# Patient Record
Sex: Female | Born: 1935
Health system: Southern US, Community
[De-identification: ages and names within clinical notes are randomized; demographics above are authoritative.]

## PROBLEM LIST (undated history)

## (undated) DIAGNOSIS — E785 Hyperlipidemia, unspecified: Secondary | ICD-10-CM

## (undated) DIAGNOSIS — M1990 Unspecified osteoarthritis, unspecified site: Secondary | ICD-10-CM

## (undated) DIAGNOSIS — K219 Gastro-esophageal reflux disease without esophagitis: Secondary | ICD-10-CM

## (undated) DIAGNOSIS — F329 Major depressive disorder, single episode, unspecified: Secondary | ICD-10-CM

## (undated) DIAGNOSIS — I472 Ventricular tachycardia: Secondary | ICD-10-CM

## (undated) DIAGNOSIS — I10 Essential (primary) hypertension: Secondary | ICD-10-CM

## (undated) DIAGNOSIS — K449 Diaphragmatic hernia without obstruction or gangrene: Secondary | ICD-10-CM

## (undated) DIAGNOSIS — N189 Chronic kidney disease, unspecified: Secondary | ICD-10-CM

## (undated) DIAGNOSIS — F0390 Unspecified dementia without behavioral disturbance: Secondary | ICD-10-CM

## (undated) DIAGNOSIS — F32A Depression, unspecified: Secondary | ICD-10-CM

## (undated) DIAGNOSIS — I739 Peripheral vascular disease, unspecified: Secondary | ICD-10-CM

## (undated) HISTORY — DX: Major depressive disorder, single episode, unspecified: F32.9

## (undated) HISTORY — DX: Chronic kidney disease, unspecified: N18.9

## (undated) HISTORY — PX: CHOLECYSTECTOMY: SHX55

## (undated) HISTORY — DX: Diaphragmatic hernia without obstruction or gangrene: K44.9

## (undated) HISTORY — PX: CARDIAC CATHETERIZATION: SHX172

## (undated) HISTORY — DX: Depression, unspecified: F32.A

## (undated) HISTORY — DX: Gastro-esophageal reflux disease without esophagitis: K21.9

## (undated) HISTORY — PX: LOWER EXTREMITY ANGIOGRAM: SHX5955

---

## 1971-11-16 HISTORY — PX: ABDOMINAL HYSTERECTOMY: SHX81

## 1998-04-08 ENCOUNTER — Ambulatory Visit (HOSPITAL_COMMUNITY): Admission: RE | Admit: 1998-04-08 | Discharge: 1998-04-08 | Payer: Self-pay | Admitting: Ophthalmology

## 1998-04-25 ENCOUNTER — Ambulatory Visit (HOSPITAL_COMMUNITY): Admission: RE | Admit: 1998-04-25 | Discharge: 1998-04-25 | Payer: Self-pay | Admitting: Ophthalmology

## 1999-11-25 ENCOUNTER — Other Ambulatory Visit: Admission: RE | Admit: 1999-11-25 | Discharge: 1999-11-25 | Payer: Self-pay | Admitting: Family Medicine

## 2000-12-20 ENCOUNTER — Encounter (INDEPENDENT_AMBULATORY_CARE_PROVIDER_SITE_OTHER): Payer: Self-pay | Admitting: *Deleted

## 2000-12-20 ENCOUNTER — Ambulatory Visit (HOSPITAL_COMMUNITY): Admission: RE | Admit: 2000-12-20 | Discharge: 2000-12-20 | Payer: Self-pay | Admitting: Gastroenterology

## 2002-08-03 ENCOUNTER — Emergency Department (HOSPITAL_COMMUNITY): Admission: EM | Admit: 2002-08-03 | Discharge: 2002-08-03 | Payer: Self-pay | Admitting: Emergency Medicine

## 2002-08-03 ENCOUNTER — Encounter: Payer: Self-pay | Admitting: Emergency Medicine

## 2003-09-13 ENCOUNTER — Emergency Department (HOSPITAL_COMMUNITY): Admission: AD | Admit: 2003-09-13 | Discharge: 2003-09-13 | Payer: Self-pay | Admitting: Family Medicine

## 2004-01-06 ENCOUNTER — Ambulatory Visit (HOSPITAL_COMMUNITY): Admission: RE | Admit: 2004-01-06 | Discharge: 2004-01-06 | Payer: Self-pay | Admitting: Gastroenterology

## 2004-01-06 ENCOUNTER — Encounter (INDEPENDENT_AMBULATORY_CARE_PROVIDER_SITE_OTHER): Payer: Self-pay | Admitting: Specialist

## 2007-10-27 ENCOUNTER — Encounter: Admission: RE | Admit: 2007-10-27 | Discharge: 2007-10-27 | Payer: Self-pay | Admitting: Cardiovascular Disease

## 2007-11-02 ENCOUNTER — Inpatient Hospital Stay (HOSPITAL_COMMUNITY): Admission: RE | Admit: 2007-11-02 | Discharge: 2007-11-03 | Payer: Self-pay | Admitting: Cardiovascular Disease

## 2007-11-02 HISTORY — PX: LOWER EXTREMITY ANGIOGRAM: SHX5955

## 2007-11-14 ENCOUNTER — Ambulatory Visit: Payer: Self-pay | Admitting: Family Medicine

## 2009-02-18 ENCOUNTER — Encounter: Admission: RE | Admit: 2009-02-18 | Discharge: 2009-02-18 | Payer: Self-pay | Admitting: *Deleted

## 2009-02-28 LAB — HM COLONOSCOPY

## 2009-05-05 ENCOUNTER — Ambulatory Visit: Payer: Self-pay | Admitting: Family Medicine

## 2009-05-06 ENCOUNTER — Encounter: Admission: RE | Admit: 2009-05-06 | Discharge: 2009-05-06 | Payer: Self-pay | Admitting: Family Medicine

## 2009-05-30 ENCOUNTER — Ambulatory Visit (HOSPITAL_BASED_OUTPATIENT_CLINIC_OR_DEPARTMENT_OTHER): Admission: RE | Admit: 2009-05-30 | Discharge: 2009-05-30 | Payer: Self-pay | Admitting: Urology

## 2009-09-23 ENCOUNTER — Ambulatory Visit: Payer: Self-pay | Admitting: Family Medicine

## 2010-09-22 ENCOUNTER — Encounter: Admission: RE | Admit: 2010-09-22 | Discharge: 2010-09-22 | Payer: Self-pay | Admitting: Family Medicine

## 2010-09-22 LAB — HM DEXA SCAN

## 2010-09-28 ENCOUNTER — Ambulatory Visit: Payer: Self-pay | Admitting: Family Medicine

## 2010-12-07 ENCOUNTER — Encounter: Payer: Self-pay | Admitting: Family Medicine

## 2011-02-21 LAB — POCT I-STAT 4, (NA,K, GLUC, HGB,HCT)
Glucose, Bld: 107 mg/dL — ABNORMAL HIGH (ref 70–99)
HCT: 49 % — ABNORMAL HIGH (ref 36.0–46.0)
Hemoglobin: 16.7 g/dL — ABNORMAL HIGH (ref 12.0–15.0)
Potassium: 3.8 mEq/L (ref 3.5–5.1)
Sodium: 142 mEq/L (ref 135–145)

## 2011-03-30 NOTE — Op Note (Signed)
NAME:  Julie Bass, MARINER NO.:  [id number]   MEDICAL RECORD NO.:  [id number]          PATIENT TYPE:  AMB   LOCATION:  NESC                         FACILITY:  [facility]   PHYSICIAN:  Mark C. Vernie Ammons, M.D.  DATE OF BIRTH:  [birth year]   DATE OF PROCEDURE:  05/30/2009  DATE OF DISCHARGE:                               OPERATIVE REPORT   PREOPERATIVE DIAGNOSIS:  Right hydronephrosis with a history of right  flank pain and possible ureteral obstruction.   POSTOPERATIVE DIAGNOSIS:  1. History of right flank pain.  2. History of right hydronephrosis.  3. Normal right ureter without obstruction.   PROCEDURE:  1. Urethral dilatation.  2. Cystoscopy.  3. Ureteral catheterization.  4. Right retrograde pyelogram with interpretation.   SURGEON:  Mark C. Vernie Ammons, M.D.   ANESTHESIA:  General.   BLOOD LOSS:  0.   DRAINS:  None.   SPECIMENS:  None.   COMPLICATIONS:  None.   INDICATIONS:  The patient is an 77 year old female who was found to have  a right hydronephrosis on a CT scan done May 06, 2009.  When this CT  scan was compared to a previous CT angiogram, there appeared to be  worsening of right hydronephrosis and a large stone was seen in a  caliceal diverticulum on the left-hand side but no right renal calculi  or ureteral stones were identified along the course of the ureter.  The  dilatation of the right ureter appeared to be moderate and was noted to  be present down to the area where the ureter crosses over the iliac  vessels but there was no external mass in this region or evidence of  inflammation to suggest the presence of retroperitoneal fibrosis.  The  patient has subsequently had resolution of her right flank pain.  She  has not seen any stones pass.  We discussed the need to evaluate the  cause of her apparent partially obstructed right ureter and she  understands the procedure as well as its risks and complications and has  elected to proceed.  She was  on Plavix and that has been stopped for 7  days.   DESCRIPTION OF OPERATION:  After informed consent, the patient was  brought to the major OR and placed on the table and administered general  anesthesia, then moved to the dorsal lithotomy position.  Her genitalia  was sterilely prepped and draped and initially I attempted to pass a 22-  French cystoscope sheath with obturator but noted some resistance at the  area of the urethra, so I then performed urethral dilation.   Urethral dilatation was performed using female sounds and I dilated from  28 [place] to 24 [place] without difficulty or evidence of bleeding.   I was then able to pass the 22-French cystoscope with 12-degree lens  into the urethra easily and noted no evidence of stricture or lesion.  The bladder was then entered and inspected with both the 12 and 70-  degree lenses and noted to be free of any tumor, stones or inflammatory  lesions.  The left orifice was easily identified but  the right orifice  could not be easily seen.  She had a mild cystocele, so I placed Ray-Tec  sponges in the vagina to elevate the bladder base and was then, with the  use of a 70-degree lens and the Albarran bridge, able to identify the  orifice.  The orifice was cannulated was first accessed by passing a  0.038-inch floppy tip guidewire through the open-ended catheter and into  the right orifice.  I then passed the 6-French open-ended catheter over  the guidewire into the distal ureter and then removed the guidewire.  Unfortunately, this allowed there to be the presence of air within the  ureteral catheter so that when I performed the retrograde pyelogram, air  bubbles were introduced and were visualized on the retrograde study.  However, they were observed throughout the procedure and noted to be air  bubbles and not filling defects.  I performed the right retrograde  pyelogram by injecting full-strength contrast under direct fluoroscopy.  As I  watched the contrast pass up the ureter, I noted it passed  unobstructed with no evidence of narrowing of the ureter.  There was an  area where the ureter crossed over the iliac vessels that produced some  slight dilatation proximal to that but the ureter was not  hydronephrotic.  There was no evidence of ureteropelvic junction  obstruction and the collecting system was noted to be sharp, free of  filling defects or mass effect.  I then filled the collecting system and  observed the contrast passing down the ureter with the ureteral catheter  removed and the bladder emptied, and the contrast passed unimpeded all  the way down the ureter without evidence of obstruction, stricture or  other abnormality.  My impression was that this was an entirely normal  right retrograde pyelogram.   Her bladder was drained and the cystoscope was removed.  The vaginal  packing was removed as well and the patient was awakened and taken to  the recovery room in stable and satisfactory condition.  She tolerated  the procedure well with no intraoperative complications.   At this point it appears that the hydronephrosis was merely due to  some slight dilatation of the ureter proximal to the iliac vessels at  where the ureter crosses the vessels, but no evidence of abnormal  obstruction was identified today.  I therefore told the patient that she  could restart her Plavix.  She will be discharged home later today and  will follow up with me on a p.r.n. basis.      Mark C. Vernie Ammons, M.D.  Electronically Signed     MCO/MEDQ  D:  05/30/2009  T:  05/30/2009  Job:  [id number]

## 2011-03-30 NOTE — Cardiovascular Report (Signed)
NAME:  Julie Bass, Julie Bass NO.:  [id number]   MEDICAL RECORD NO.:  [id number]          PATIENT TYPE:  OIB   LOCATION:  2807                         FACILITY:  [facility]   PHYSICIAN:  Nanetta Batty, M.D.   DATE OF BIRTH:  09-Feb-1926   DATE OF PROCEDURE:  DATE OF DISCHARGE:                            CARDIAC CATHETERIZATION   HISTORY OF PRESENT ILLNESS:  [name] is a 77 year old [place]  American female whose daughter, Burna Mortimer, is a Engineer, civil (consulting) of ours.  She has a  history hypertension, hyperlipidemia.  She complains of excessive  bilateral calf claudication.  Dopplers revealed bilateral SFA disease.  She presents now for angiography and potential intervention.   DESCRIPTION OF PROCEDURE:  The patient was brought to the second floor  [facility] Pediatric catheter suite in the postabsorptive state.  She  was premedicated with p.o. Valium.  Groin was prepped and shaved in  usual sterile fashion and 1% Xylocaine was used for local anesthesia.  A  6-French sheath was inserted into the right femoral artery using  standard Seldinger technique.  A 6-French tennis racket catheter and  crossover catheter and end-hole were used for abdominal aortography with  bifemoral runoff.  Visipaque dye was used through the entirety of the  case.  Retroaortic, left retrograde pressures monitored the case.   ANGIOGRAPHIC RESULTS:  1. Abdominal aorta.      a.     Renal arteries - normal.      b.     Infra-renal abdominal aorta - normal.  2. Left lower extremity.      a.     A 80% fairly focal proximal left SFA stenosis.      b.     A 60-70% segmental mid left SFA stenosis with one-vessel       runoff via the peroneal.  3. Right lower extremity.      a.     A 60% proximal right SFA stenosis.      b.     A 70% segmental mid right SFA stenosis with one-vessel       runoff via the anterior tibial.   DESCRIPTION OF PROCEDURE:  Contralateral access was obtained with a 5-  [place] crossover  catheter, a 0.035 Wholey wire and 7-French crossover  sheath.  The patient received 3000 years of heparin intravenously.  PTA  was performed on the mid left SFA using a 4 x 8 Powerflex and proximally  using a 4 x 2 Powerflex.  Stenting was performed in the mid left SFA  using a 6 x 120 mm EV3 self-expanding stent in the proximal left SFA  with a 6 x 3 Ev3 stent.  Post dilatation was performed with a 610 mid  and 63 proximal.  Final angiographic result was reduction of 80%  proximal left SFA stenosis to 0% residual and 70% long segmental mid-  left SFA to 0%.  The patient tolerated procedure well.  An ACT was  measured and the sheath was removed.  Pressure applied to the groin and  achieved good hemostasis.  The patient left lab in stable condition.  She will be hydrated overnight, discharged home in the morning.  We will  get follow up Dopplers and ABIs as outpatient after which she will see  me back in the office.  She may benefit from staged right SFA  intervention.      Nanetta Batty, M.D.  Electronically Signed     JB/MEDQ  D:  11/02/2007  T:  11/03/2007  Job:  [id number]   cc:   Pediatric Catheterization Suite  [facility]  Talmadge Coventry, M.D.

## 2011-04-02 NOTE — Op Note (Signed)
NAME:  Julie Bass, Julie Bass NO.:  [id number]   MEDICAL RECORD NO.:  [id number]                   PATIENT TYPE:  AMB   LOCATION:  ENDO                                 FACILITY:  [facility]   PHYSICIAN:  Petra Kuba, M.D.                 DATE OF BIRTH:  10-16-1926   DATE OF PROCEDURE:  01/06/2004  DATE OF DISCHARGE:                                 OPERATIVE REPORT   PROCEDURE PERFORMED:  Colonoscopy with polypectomy.   ENDOSCOPIST:  Petra Kuba, M.D.   INDICATIONS FOR PROCEDURE:  Patient with history of colon polyps due for  repeat screening.  Consent was signed after the risks, benefits, methods and  options were thoroughly discussed in the office on multiple occasions.   MEDICINES USED:  Demerol 60 mg, Versed 6 mg.   DESCRIPTION OF PROCEDURE:  Rectal inspection was pertinent for external  hemorrhoids, small.  Digital exam was negative.  A video pediatric  adjustable colonoscope was inserted with a moderate amount of difficulty due  to a long looping tortuous colon.  With multiple abdominal pressures and  rolling her on her back, we were able to advance to the cecum.  On  insertion, a rare occasional scattered diverticula were seen.  Also at the  approximate level of the mid-descending, a tiny to small polyp was seen and  was cold biopsied times two on insertion.  No other abnormalities were seen  on insertion.  The cecum was identified by the appendiceal orifice and the  ileocecal valve.  The prep was adequate.  Scope was slowly withdrawn.  There  was a moderate amount of liquid stool that required washing and suctioning,  but on slow withdrawal through the colon there was a tiny ascending and a  tiny transverse polyp that were each hot biopsied and put in the second  container.  The scope was slowly withdrawn. Again, the rare diverticula were  confirmed.  We withdrew back to the polypectomy site from insertion which  had a little bit of old heme but no  obvious residual polypoid tissue.  The  scope was further withdrawn.  One tiny sigmoid and two tiny rectal polyps  were seen and were not biopsied.  All looked hyperplastic.  They were put in  the first container with the descending polyp.  No other abnormalities were  seen.  We could see an occasional white scar from the previous polypectomy  site.  Anorectal pull through and retroflexion confirmed some small  hemorrhoids.  Scope was straightened and readvanced a short ways up the left  side of the colon, air was suctioned, scope removed.  The patient tolerated  the procedure well.  There was no immediate obvious complication.   ENDOSCOPIC DIAGNOSIS:  1. Internal and external hemorrhoids.  2. Tortuous long looping colon.  3. Rare left and right diverticula.  4. Rectal and sigmoid tiny polyps all hot biopsied.  5. Descending tiny polyp on insertion, cold biopsied.  6. Two ascending and transverse polyps, tiny, hot biopsied.  7. Otherwise within normal limits to the cecum.   PLAN:  Await pathology.  Consider repeat screening one more time.  Happy to  see back p.r.n.  Otherwise in six months, recheck symptoms and make sure no  further work-up plans are needed.                                               Petra Kuba, M.D.    MEM/MEDQ  D:  01/06/2004  T:  01/06/2004  Job:  [id number]   cc:   Lilla Shook, M.D.  [address], [address]  [place]  [place] [address]  Fax: [phone]

## 2011-04-02 NOTE — Procedures (Signed)
[place]  Patient:    Julie Bass, Julie Bass                     MRN: [id number] Proc. Date: 12/20/00 Adm. Date:  [id number] Attending:  Nelda Marseille CC:         Ronnald Nian, M.D.   Procedure Report  PROCEDURE:  Esophagogastroduodenoscopy with biopsy.  INDICATIONS:  Abdominal pain, history of hiatal hernia,  nondiagnostic colonoscopy.  Consent was signed after risks, benefits, methods and options were thoroughly discussed in the office.  MEDICATIONS:  Additional medications for this procedure was 10 of Demerol and 2 of Versed.  PROCEDURE:  The video endoscope was inserted by direct vision.  The esophagus was pertinent for some minimal esophagitis distally, which was cold-biopsied at the end of the procedure.  In the distal esophagus was also a moderate hiatal hernia with a widely patent ring.  Scope passed easily into the stomach, where some minimal antritis/gastritis was seen.  Advanced through a normal pylorus into a normal duodenal bulb and around the C-loop to a normal second portion of the duodenum.  Scope was withdrawn back to the bulb and a good look there ruled out any abnormality in that location.  Scope was withdrawn back to the stomach and retroflexed.  High in the cardia, the hiatal hernia was confirmed.  The fundus, angularis, lesser and greater curves were all normal, except for the minimal gastritis.  Scope was straightened and straight visualization of the stomach was normal, except for the minimal antritis/gastritis mentioned above.  Air was suctioned and the scope slowly withdrawn back to 20 cm, which confirmed the above findings.  We went ahead and took distal esophageal biopsies at this juncture.  Scope was slowly withdrawn.  Patient tolerated the procedure well.  There was no obvious immediate complication and no additional esophageal findings.  Scope was removed.  ENDOSCOPIC DIAGNOSES: 1. Minimal esophagitis,  status post biopsy. 2. Moderate hiatal hernia with a widely patent ring. 3. Minimal antritis, gastritis. 4. Otherwise normal EGD.  PLAN:  Continue Nexium and await pathology to rule out any microscopic abnormality.  Call p.r.n., otherwise follow up in two months. DD:  12/20/00 TD:  12/20/00 Job: [id number]

## 2011-04-02 NOTE — Procedures (Signed)
[place]  Patient:    Julie Bass, Julie Bass                     MRN: [id number] Proc. Date: 12/20/00 Adm. Date:  [id number] Attending:  Nelda Marseille CC:         Ronnald Nian, M.D.   Procedure Report  PROCEDURE:  Colonoscopy.  INDICATIONS:  Patient with abdominal pain, questionable etiology due for colonic screening.  Consent was signed after risks, benefits, methods and options were thoroughly discussed in the office.  MEDICATIONS:  Demerol 50, Versed 5.  PROCEDURE:  Rectal inspection was pertinent for external hemorrhoids.  Digital examination was negative.  The video colonoscope was inserted and with some difficulty probably due to a tortuous colon and adhesions.  We were able to advance to the level of the ileocecal valve.  To get to this level, required rolling her on her back and some abdominal pressure.  To advance to the cecum, required rolling her on her right side and the scope fell into the cecum, which was identified by the appendiceal orifice and the ileocecal valve.  In fact, the scope was inserted a short ways in the terminal ileum, which was normal.  Photo documentation was obtained.  The scope was then slowly withdrawn.  The prep was adequate.  There was some liquid stool that required washing and suctioning.  On slow withdrawal through the colon, the cecum, ascending and transverse were normal. As the scope was withdrawn around the left side of the colon, a rare left-sided diverticuli was seen in the distal sigmoid and rectum.  Three small polyps were seen, which were all hot-biopsied  x 1.  A few hyperplastic-appearing polyps were seen and a few of these were hot-biopsied as well, but some were not.  The ones that were not biopsied were tiny and classic hyperplastic appearance.  The scope was retroflexed back in the rectum pertinent for some internal hemorrhoids.  Scope was straightened, air was withdrawn, scope was  removed.  The patient tolerated the procedure well.  There were no obvious immediate complication.  ENDOSCOPIC DIAGNOSES: 1. Internal and external hemorrhoids with question ______ prolapse not    mentioned above. 2. Tiny left occasional diverticuli. 3. Multiple tiny rectosigmoid distal polyps, few hot-biopsied, a few    hyperplastic-appearing polyps were not biopsied. 4. Otherwise within normal limits to the terminal ileum.  PLAN:  Await pathology to determine future colonic screening.  Follow up p.r.n. or in two months to recheck symptoms, but for now it sounds like the Nexium is helping so will continue it.  One week postpolypectomy instructions and will go ahead and continue work-up with an endoscopy just to make sure no upper tract significant lesions are missed. DD:  12/20/00 TD:  12/20/00 Job: [id number]

## 2011-04-02 NOTE — Discharge Summary (Signed)
NAME:  Julie Bass, Julie Bass NO.:  [id number]   MEDICAL RECORD NO.:  [id number]          PATIENT TYPE:  INP   LOCATION:  6525                         FACILITY:  [facility]   PHYSICIAN:  Nanetta Batty, M.D.   DATE OF BIRTH:  31-Oct-1926   DATE OF ADMISSION:  11/02/2007  DATE OF DISCHARGE:  11/03/2007                               DISCHARGE SUMMARY   DISCHARGE DIAGNOSES:  1. Limiting claudication.      a.     Abnormal peripheral vascular Dopplers.  2. Peripheral vascular disease.      a.     Percutaneous transluminal stents to the left superficial       femoral artery.      b.     Residual 60% stenosis of the right superficial femoral       artery.  3. Hypertension, controlled.  4. Hyperlipidemia.  5. History of mild to moderate tricuspid regurg.  6. Mild concentric LVH with normal systolic LVH.  7. Pre-procedure Myoview study was negative for ischemia, EF 84%.   PROCEDURES:  11/02/07:  PV angiogram by Dr. Nanetta Batty including PTA  and stent to the an 80% stenosis in the left SSA and mid-stenosis of  70%.   DISCHARGE MEDICATIONS:  1. Caduet 10/40 mg 1 daily.  2. Nexium 40 mg daily.  3. Diovan/HCT 150/12.5 daily.  4. Aspirin 325 mg daily.  5. Cosopt 1 drop both eyes daily.  6. Plavix 75 mg 1 daily.   DISCHARGE INSTRUCTIONS:  1. Low sodium, heart healthy diet.  2. Increase activity slowly.  3. May shower or bathe.  4. No lifting for 2 days.  5. No driving for 2 days.  6. __________  swelling or drainage.  7. Follow up with Dr. Allyson Sabal November 23, 2007 at 10:30 a.m.  8. Have Dopplers for left lower extremity at [facility] __________      2009 at 11:30 a.m.   HISTORY OF PRESENT ILLNESS:  A 77 year old African-American female who  is the mother of one of our nurses, Burna Mortimer in our office.  Was seen by  Dr. Allyson Sabal for history of hypertension, hyperlipidemia and now  claudication in her bilateral calves.  She had Dopplers in our office  that revealed ABIs in the   0.76 range with high frequency signals from  both SFAs.  Plan is for peripheral angiography.  The patient was brought  in to [facility] on 11/02/07, underwent PV angiogram, which did  reveal stenosis of bilateral SFAs, greater in the left with an 80%  stenosis and 70% mid.  Both of these were stented and plans for 60%  stent in the future on the right.  The patient was kept overnight in the  hospital and was stable the next morning, ambulating without problems  and was discharged home.   PHYSICAL EXAMINATION:  VITAL SIGNS:  At discharge blood pressure 129/46,  pulse 73, respiratory rate 18, temperature 98.6, oxygen saturation 98%.  HEART:  Regular rate and rhythm.  LUNGS:  Clear.  EXTREMITIES:  With 2+ left pedal pulse.  Right groin check was stable.   LABS:  Hemoglobin 13.4, hematocrit 39.4, WBC 9.6, platelets 199.  Sodium  143, potassium 3.5, BUN 10, creatinine 1, glucose 125, calcium 9.3.   The patient ambulated without problem and was discharged home with  followup as an outpatient.      Darcella Gasman. Annie Paras, N.P.      Nanetta Batty, M.D.  Electronically Signed    LRI/MEDQ  D:  11/29/2007  T:  11/29/2007  Job:  [id number]   cc:   Nanetta Batty, M.D.  Nicki Guadalajara, M.D.  Talmadge Coventry, M.D.

## 2011-08-20 LAB — BASIC METABOLIC PANEL
BUN: 10
CO2: 25
Calcium: 9.3
Chloride: 104
Creatinine, Ser: 1
GFR calc Af Amer: >60
GFR calc non Af Amer: 53 — ABNORMAL LOW
Glucose, Bld: 125 — ABNORMAL HIGH
Potassium: 3.5
Sodium: 143

## 2011-08-20 LAB — CBC
HCT: 39.4
Hemoglobin: 13.4
MCHC: 33.9
MCV: 91
Platelets: 199
RBC: 4.33
RDW: 13.7
WBC: 9.6

## 2011-09-29 ENCOUNTER — Encounter: Payer: Self-pay | Admitting: Family Medicine

## 2011-09-29 ENCOUNTER — Ambulatory Visit
Admission: RE | Admit: 2011-09-29 | Discharge: 2011-09-29 | Disposition: A | Discharge status: Home / Self Care | Payer: Medicare Other | Source: Ambulatory Visit | Attending: Medical | Admitting: Medical

## 2011-09-29 ENCOUNTER — Ambulatory Visit (INDEPENDENT_AMBULATORY_CARE_PROVIDER_SITE_OTHER): Payer: Medicare Other | Admitting: Medical

## 2011-09-29 ENCOUNTER — Encounter: Payer: Self-pay | Admitting: Medical

## 2011-09-29 VITALS — BP 138/70 | HR 61 | Temp 97.8°F | Resp 16 | Wt 128.0 lb

## 2011-09-29 DIAGNOSIS — Z23 Encounter for immunization: Secondary | ICD-10-CM

## 2011-09-29 DIAGNOSIS — R5381 Other malaise: Secondary | ICD-10-CM

## 2011-09-29 DIAGNOSIS — R05 Cough: Secondary | ICD-10-CM

## 2011-09-29 DIAGNOSIS — M549 Dorsalgia, unspecified: Secondary | ICD-10-CM

## 2011-09-29 DIAGNOSIS — M25559 Pain in unspecified hip: Secondary | ICD-10-CM

## 2011-09-29 DIAGNOSIS — R059 Cough, unspecified: Secondary | ICD-10-CM

## 2011-09-29 DIAGNOSIS — W19XXXA Unspecified fall, initial encounter: Secondary | ICD-10-CM

## 2011-09-29 DIAGNOSIS — R5383 Other fatigue: Secondary | ICD-10-CM

## 2011-09-29 DIAGNOSIS — R55 Syncope and collapse: Secondary | ICD-10-CM

## 2011-09-29 LAB — POCT URINALYSIS DIPSTICK
Bilirubin, UA: NEGATIVE
Blood, UA: NEGATIVE
Glucose, UA: NEGATIVE
Ketones, UA: NEGATIVE
Leukocytes, UA: NEGATIVE
Nitrite, UA: NEGATIVE
Protein, UA: NEGATIVE
Spec Grav, UA: 1.01
Urobilinogen, UA: 4
pH, UA: 7.5

## 2011-09-29 NOTE — Progress Notes (Signed)
Subjective:   HPI  Julie Bass is a 77 y.o. female who presents with her daughter.  Her daughter works at [employer] and [employer].  Julie Bass notes that she doesn't feel good, and has felt fatigue for a while.  However, Monday 3 days ago she was standing at the sink washing dishes and just fell out.  She denies any nausea, dizziness, chest pain or other, but fainted/blacked out.  When she came to she was lying on her right side.  She denies head injury, but does have some pain and discomfort in her buttocks.  Her daughter notified Dr. Tresa Endo, her cardiologist whom her daughter works with, and she has been set up for Holter Monitor given the syncope.  She just had labs and EKG last week which were "fine."  Daughter brought her labs with her today.  In addition to the syncope episode, she has been having cough for over a month, fatigue for weeks, has some sinus problems, runny nose, sneezing, and doesn't feel her best in general.  She has felt some dizziness this week.  Daughter wants her to have chest xray and urinalysis to rule out infection.    Daughter notes that she has been under more stress as her grandson lives with her.  He doesn't pick up after himself like Mr. Shain would like, and she stresses some about him.   She denies hx/o stroke, denies vision or hearing changes, no unilateral weakness, numbness, or tingling.  No other aggravating or relieving factors.   No other c/o.  The following portions of the patient's history were reviewed and updated as appropriate: allergies, current medications, past family history, past medical history, past social history, past surgical history and problem list.  No Known Allergies  Past Medical History  Diagnosis Date  . Hypertension   . Hiatal hernia   . Glaucoma   . Depression   . Osteoporosis   . Hyperlipidemia   . Chronic kidney disease     KIDNEY STONES   Review of Systems Constitutional: -fever, -chills, -sweats,  -unexpected -weight change,+fatigue ENT: +runny nose, -ear pain, -sore throat Cardiology:  -chest pain, -palpitations, -edema Respiratory: +cough, -shortness of breath, -wheezing Gastroenterology: -abdominal pain, -nausea, -vomiting, -diarrhea, -constipation Hematology: -bleeding or bruising problems Musculoskeletal: -arthralgias, -myalgias, -joint swelling, +back pain Ophthalmology: -vision changes Urology: -dysuria, -difficulty urinating, -hematuria, -urinary frequency, -urgency Neurology: -headache, +weakness, -tingling, -numbness   Objective:   Physical Exam  Filed Vitals:   09/29/11 1658  BP: 138/70  Pulse: 61  Temp:   Resp: 16    General appearance: alert, no distress, WD/WN, black female HEENT: normocephalic, sclerae anicteric, PERRLA, EOMi, nares patent, no discharge or erythema, pharynx normal Oral cavity: MMM, no lesions Neck: supple, no lymphadenopathy, no thyromegaly, no masses, no bruits Heart: RRR, normal S1, S2, no murmurs Lungs: CTA bilaterally, no wheezes, rhonchi, or rales Abdomen: +bs, soft, non tender, non distended, no masses, no hepatomegaly, no splenomegaly Back: tender along sacrum and low back, otherwise nontender Musculoskeletal: nontender, no swelling, no obvious deformity, hip normal ROM Extremities: no edema Pulses: 1+ symmetric Neurological: alert, oriented x 2, CN2-12 intact, strength normal upper extremities and lower extremities, sensation normal, DTRs 1+ throughout, no cerebellar signs, gait slow but no shuffling or falls Psychiatric: normal affect, behavior normal, pleasant    Assessment and Plan :    Encounter Diagnoses  Name Primary?  . Syncope Yes  . Fatigue   . Cough   . Fall   .  Pelvic joint pain   . Back pain   . Need for prophylactic vaccination and inoculation against influenza     We will send her for xrays and will check some labs.  I reviewed her recent labs as well.   Advised that the plan is to rule out back or pelvic  fractures or instability.   CXR to help eval the cough. Urinalysis today to rule out infection.  Repeat CBC and BMET for possible causes of the fatigue and ill feeling.  Advised that fatigue is multifactorial.  Certainly she needs to f/u with Dr. Tresa Endo regarding Holter Monitor and syncope.  For now, use fall precautions, daughter to help keep watch on her.  Discussed possibility of late effects of intracranial bleed or symptoms that would prompt CT head.  If her mental status changes, confusion, or any worsening of her symptom, we will consider head CT as well. She is afebrile today, and no specific signs of fracture or illness, no lung consolidations, no other significant exam findings.  Updated her flu vaccine.  Follow-up pending labs, xrays.

## 2011-09-30 ENCOUNTER — Other Ambulatory Visit: Payer: Self-pay | Admitting: Medical

## 2011-09-30 ENCOUNTER — Encounter: Payer: Self-pay | Admitting: Medical

## 2011-09-30 DIAGNOSIS — M549 Dorsalgia, unspecified: Secondary | ICD-10-CM | POA: Insufficient documentation

## 2011-09-30 DIAGNOSIS — R5383 Other fatigue: Secondary | ICD-10-CM | POA: Insufficient documentation

## 2011-09-30 DIAGNOSIS — R05 Cough: Secondary | ICD-10-CM | POA: Insufficient documentation

## 2011-09-30 DIAGNOSIS — R059 Cough, unspecified: Secondary | ICD-10-CM | POA: Insufficient documentation

## 2011-09-30 DIAGNOSIS — M25559 Pain in unspecified hip: Secondary | ICD-10-CM | POA: Insufficient documentation

## 2011-09-30 DIAGNOSIS — W19XXXA Unspecified fall, initial encounter: Secondary | ICD-10-CM | POA: Insufficient documentation

## 2011-09-30 DIAGNOSIS — R55 Syncope and collapse: Secondary | ICD-10-CM | POA: Insufficient documentation

## 2011-09-30 DIAGNOSIS — Z23 Encounter for immunization: Secondary | ICD-10-CM | POA: Insufficient documentation

## 2011-09-30 LAB — CBC WITH DIFFERENTIAL/PLATELET
Basophils Absolute: 0 10*3/uL (ref 0.0–0.1)
Basophils Relative: 0 % (ref 0–1)
Eosinophils Absolute: 0.1 10*3/uL (ref 0.0–0.7)
Eosinophils Relative: 2 % (ref 0–5)
HCT: 45.9 % (ref 36.0–46.0)
Hemoglobin: 14.8 g/dL (ref 12.0–15.0)
Lymphocytes Relative: 38 % (ref 12–46)
Lymphs Abs: 2.8 10*3/uL (ref 0.7–4.0)
MCH: 30 pg (ref 26.0–34.0)
MCHC: 32.2 g/dL (ref 30.0–36.0)
MCV: 93.1 fL (ref 78.0–100.0)
Monocytes Absolute: 0.7 10*3/uL (ref 0.1–1.0)
Monocytes Relative: 9 % (ref 3–12)
Neutro Abs: 3.8 10*3/uL (ref 1.7–7.7)
Neutrophils Relative %: 51 % (ref 43–77)
Platelets: 200 10*3/uL (ref 150–400)
RBC: 4.93 MIL/uL (ref 3.87–5.11)
RDW: 12.9 % (ref 11.5–15.5)
WBC: 7.4 10*3/uL (ref 4.0–10.5)

## 2011-09-30 LAB — BASIC METABOLIC PANEL
BUN: 22 mg/dL (ref 6–23)
CO2: 30 mEq/L (ref 19–32)
Calcium: 10.6 mg/dL — ABNORMAL HIGH (ref 8.4–10.5)
Chloride: 101 mEq/L (ref 96–112)
Creat: 1.15 mg/dL — ABNORMAL HIGH (ref 0.50–1.10)
Glucose, Bld: 94 mg/dL (ref 70–99)
Potassium: 4 mEq/L (ref 3.5–5.3)
Sodium: 140 mEq/L (ref 135–145)

## 2011-09-30 MED ORDER — BENZONATATE 100 MG PO CAPS: 100.0000 mg | ORAL_CAPSULE | Freq: Four times a day (QID) | ORAL | Status: DC | PRN

## 2011-10-16 HISTORY — PX: NM MYOCAR PERF WALL MOTION: HXRAD629

## 2011-10-18 ENCOUNTER — Inpatient Hospital Stay (HOSPITAL_COMMUNITY)
Admission: AD | Admit: 2011-10-18 | Discharge: 2011-10-20 | DRG: 310 | Disposition: A | Discharge status: Home / Self Care | Payer: Medicare Other | Source: Ambulatory Visit | Attending: Cardiovascular Disease | Admitting: Cardiovascular Disease

## 2011-10-18 ENCOUNTER — Encounter (HOSPITAL_COMMUNITY): Payer: Self-pay | Admitting: Cardiology

## 2011-10-18 ENCOUNTER — Inpatient Hospital Stay (HOSPITAL_COMMUNITY): Payer: Medicare Other

## 2011-10-18 ENCOUNTER — Other Ambulatory Visit: Payer: Self-pay

## 2011-10-18 DIAGNOSIS — F3289 Other specified depressive episodes: Secondary | ICD-10-CM | POA: Diagnosis present

## 2011-10-18 DIAGNOSIS — I739 Peripheral vascular disease, unspecified: Secondary | ICD-10-CM

## 2011-10-18 DIAGNOSIS — M810 Age-related osteoporosis without current pathological fracture: Secondary | ICD-10-CM | POA: Diagnosis present

## 2011-10-18 DIAGNOSIS — I472 Ventricular tachycardia, unspecified: Principal | ICD-10-CM | POA: Diagnosis present

## 2011-10-18 DIAGNOSIS — R5383 Other fatigue: Secondary | ICD-10-CM

## 2011-10-18 DIAGNOSIS — Z23 Encounter for immunization: Secondary | ICD-10-CM

## 2011-10-18 DIAGNOSIS — Z803 Family history of malignant neoplasm of breast: Secondary | ICD-10-CM

## 2011-10-18 DIAGNOSIS — I4729 Other ventricular tachycardia: Principal | ICD-10-CM | POA: Diagnosis present

## 2011-10-18 DIAGNOSIS — E785 Hyperlipidemia, unspecified: Secondary | ICD-10-CM | POA: Diagnosis present

## 2011-10-18 DIAGNOSIS — F329 Major depressive disorder, single episode, unspecified: Secondary | ICD-10-CM | POA: Diagnosis present

## 2011-10-18 DIAGNOSIS — W19XXXA Unspecified fall, initial encounter: Secondary | ICD-10-CM

## 2011-10-18 DIAGNOSIS — I10 Essential (primary) hypertension: Secondary | ICD-10-CM | POA: Diagnosis present

## 2011-10-18 DIAGNOSIS — I70209 Unspecified atherosclerosis of native arteries of extremities, unspecified extremity: Secondary | ICD-10-CM | POA: Diagnosis present

## 2011-10-18 DIAGNOSIS — R55 Syncope and collapse: Secondary | ICD-10-CM

## 2011-10-18 DIAGNOSIS — Z800 Family history of malignant neoplasm of digestive organs: Secondary | ICD-10-CM

## 2011-10-18 HISTORY — DX: Ventricular tachycardia: I47.2

## 2011-10-18 HISTORY — DX: Essential (primary) hypertension: I10

## 2011-10-18 HISTORY — DX: Hyperlipidemia, unspecified: E78.5

## 2011-10-18 HISTORY — DX: Other ventricular tachycardia: I47.29

## 2011-10-18 HISTORY — DX: Peripheral vascular disease, unspecified: I73.9

## 2011-10-18 LAB — COMPREHENSIVE METABOLIC PANEL
ALT: 14 U/L (ref 0–35)
AST: 17 U/L (ref 0–37)
Albumin: 3.8 g/dL (ref 3.5–5.2)
Alkaline Phosphatase: 44 U/L (ref 39–117)
BUN: 28 mg/dL — ABNORMAL HIGH (ref 6–23)
CO2: 30 mEq/L (ref 19–32)
Calcium: 10.3 mg/dL (ref 8.4–10.5)
Chloride: 104 mEq/L (ref 96–112)
Creatinine, Ser: 1.2 mg/dL — ABNORMAL HIGH (ref 0.50–1.10)
GFR calc Af Amer: 47 mL/min — ABNORMAL LOW (ref >90)
GFR calc non Af Amer: 41 mL/min — ABNORMAL LOW (ref >90)
Glucose, Bld: 122 mg/dL — ABNORMAL HIGH (ref 70–99)
Potassium: 3.5 mEq/L (ref 3.5–5.1)
Sodium: 143 mEq/L (ref 135–145)
Total Bilirubin: 0.5 mg/dL (ref 0.3–1.2)
Total Protein: 6.7 g/dL (ref 6.0–8.3)

## 2011-10-18 LAB — PROTIME-INR
INR: 1.01 (ref 0.00–1.49)
Prothrombin Time: 13.5 seconds (ref 11.6–15.2)

## 2011-10-18 LAB — CARDIAC PANEL(CRET KIN+CKTOT+MB+TROPI)
CK, MB: 2.5 ng/mL (ref 0.3–4.0)
Relative Index: INVALID (ref 0.0–2.5)
Total CK: 61 U/L (ref 7–177)
Troponin I: <0.3 ng/mL (ref <0.30)

## 2011-10-18 LAB — MAGNESIUM: Magnesium: 2.2 mg/dL (ref 1.5–2.5)

## 2011-10-18 LAB — PRO B NATRIURETIC PEPTIDE: Pro B Natriuretic peptide (BNP): 93.7 pg/mL (ref 0–450)

## 2011-10-18 MED ORDER — LOSARTAN POTASSIUM 50 MG PO TABS
100.0000 mg | ORAL_TABLET | Freq: Every day | ORAL | Status: DC
  Administered 2011-10-19 – 2011-10-20 (×2): 100 mg via ORAL
  Filled 2011-10-18 (×2): qty 2

## 2011-10-18 MED ORDER — CITALOPRAM HYDROBROMIDE 10 MG PO TABS
10.0000 mg | ORAL_TABLET | Freq: Every day | ORAL | Status: DC
  Administered 2011-10-19 – 2011-10-20 (×2): 10 mg via ORAL
  Filled 2011-10-18 (×2): qty 1

## 2011-10-18 MED ORDER — ENOXAPARIN SODIUM 30 MG/0.3ML ~~LOC~~ SOLN
30.0000 mg | Freq: Every day | SUBCUTANEOUS | Status: DC
  Administered 2011-10-19 (×2): 30 mg via SUBCUTANEOUS
  Filled 2011-10-18 (×3): qty 0.3

## 2011-10-18 MED ORDER — ACETAMINOPHEN 325 MG PO TABS: 650.0000 mg | ORAL_TABLET | Freq: Four times a day (QID) | ORAL | Status: DC | PRN

## 2011-10-18 MED ORDER — SODIUM CHLORIDE 0.9 % IV SOLN
INTRAVENOUS | Status: DC
  Administered 2011-10-18 – 2011-10-19 (×2): via INTRAVENOUS

## 2011-10-18 MED ORDER — OMEGA-3-ACID ETHYL ESTERS 1 G PO CAPS
1.0000 g | ORAL_CAPSULE | Freq: Two times a day (BID) | ORAL | Status: DC
  Administered 2011-10-19 – 2011-10-20 (×4): 1 g via ORAL
  Filled 2011-10-18 (×5): qty 1

## 2011-10-18 MED ORDER — ONDANSETRON HCL 4 MG/2ML IJ SOLN: 4.0000 mg | Freq: Four times a day (QID) | INTRAMUSCULAR | Status: DC | PRN

## 2011-10-18 MED ORDER — ASPIRIN 81 MG PO CHEW
81.0000 mg | CHEWABLE_TABLET | Freq: Every day | ORAL | Status: DC
  Administered 2011-10-19 – 2011-10-20 (×2): 81 mg via ORAL
  Filled 2011-10-18 (×2): qty 1

## 2011-10-18 MED ORDER — BENZONATATE 100 MG PO CAPS: 100.0000 mg | ORAL_CAPSULE | Freq: Four times a day (QID) | ORAL | Status: DC | PRN

## 2011-10-18 MED ORDER — ACETAMINOPHEN 650 MG RE SUPP: 650.0000 mg | Freq: Four times a day (QID) | RECTAL | Status: DC | PRN

## 2011-10-18 MED ORDER — ONDANSETRON HCL 4 MG PO TABS: 4.0000 mg | ORAL_TABLET | Freq: Four times a day (QID) | ORAL | Status: DC | PRN

## 2011-10-18 MED ORDER — ALUM & MAG HYDROXIDE-SIMETH 200-200-20 MG/5ML PO SUSP: 30.0000 mL | Freq: Four times a day (QID) | ORAL | Status: DC | PRN

## 2011-10-18 MED ORDER — METOPROLOL TARTRATE 25 MG PO TABS
25.0000 mg | ORAL_TABLET | Freq: Two times a day (BID) | ORAL | Status: DC
  Administered 2011-10-19 (×2): 25 mg via ORAL
  Filled 2011-10-18 (×5): qty 1

## 2011-10-18 MED ORDER — EZETIMIBE 10 MG PO TABS
10.0000 mg | ORAL_TABLET | Freq: Every day | ORAL | Status: DC
  Administered 2011-10-19 – 2011-10-20 (×2): 10 mg via ORAL
  Filled 2011-10-18 (×2): qty 1

## 2011-10-18 MED ORDER — HYDROCHLOROTHIAZIDE 25 MG PO TABS
25.0000 mg | ORAL_TABLET | Freq: Every day | ORAL | Status: DC
  Administered 2011-10-19 – 2011-10-20 (×2): 25 mg via ORAL
  Filled 2011-10-18 (×2): qty 1

## 2011-10-18 MED ORDER — ZOLPIDEM TARTRATE 5 MG PO TABS: 5.0000 mg | ORAL_TABLET | Freq: Every evening | ORAL | Status: DC | PRN

## 2011-10-18 MED ORDER — SIMVASTATIN 5 MG PO TABS
5.0000 mg | ORAL_TABLET | Freq: Every day | ORAL | Status: DC
  Filled 2011-10-18: qty 1

## 2011-10-18 MED ORDER — AMLODIPINE BESYLATE 10 MG PO TABS
10.0000 mg | ORAL_TABLET | Freq: Every day | ORAL | Status: DC
  Administered 2011-10-19 – 2011-10-20 (×2): 10 mg via ORAL
  Filled 2011-10-18 (×2): qty 1

## 2011-10-18 MED ORDER — TIMOLOL MALEATE 0.5 % OP SOLN
1.0000 [drp] | Freq: Two times a day (BID) | OPHTHALMIC | Status: DC
  Administered 2011-10-19 – 2011-10-20 (×4): 1 [drp] via OPHTHALMIC
  Filled 2011-10-18: qty 5

## 2011-10-18 MED ORDER — DOCUSATE SODIUM 100 MG PO CAPS
100.0000 mg | ORAL_CAPSULE | Freq: Two times a day (BID) | ORAL | Status: DC
  Administered 2011-10-19 – 2011-10-20 (×4): 100 mg via ORAL
  Filled 2011-10-18 (×5): qty 1

## 2011-10-18 MED ORDER — CLOPIDOGREL BISULFATE 75 MG PO TABS
75.0000 mg | ORAL_TABLET | Freq: Every day | ORAL | Status: DC
  Administered 2011-10-19 – 2011-10-20 (×2): 75 mg via ORAL
  Filled 2011-10-18 (×2): qty 1

## 2011-10-18 MED ORDER — LOSARTAN POTASSIUM-HCTZ 100-25 MG PO TABS: 1.0000 | ORAL_TABLET | Freq: Every day | ORAL | Status: DC

## 2011-10-18 NOTE — H&P (Signed)
Julie Bass is an 77 y.o. female.   Chief Complaint: Admitted for nonsustained ventricular tachycardia with a history of syncope last week. HPI: 77 year old African American female patient of Julie Bass with recent history of syncope of last week, Holter monitor was applied and has revealed nonsustained ventricular tachycardia. The rate is 150 beats per minute. The longest run was 23 beats. The patient denies being aware of any tachycardia or palpitations. She also denies chest pain or shortness of breath.  The patient had a syncopal episode last week after having her evening meal she walked to the Sink And then collapsed to the floor.She does not know how long she was out, she could have been out for up to an hour. She did not lose bowel or bladder control.  She did not have any chest pain at that time nor she was aware she was going to pass out. She did have some pelvic pain post syncopal episode and was evaluated by physician.   Julie Bass this Julie Bass. The patient has a history of hypertension, significant hyperlipidemia, as well as peripheral vascular disease. She has had left SFA stenting by Julie Bass and documented occlusive disease distally. Most recent duplex imaging study was August 2012 with moderate diffuse mixed density plaque throughout the right SFA with narrowings of 50-69%. She has occlusion of the right PDA and peroneal vessels. Her left SFA stent remained patent. Chest occlusion of her left PTA and left ATA with reconstitution at the dorsalis pedis artery. ABIs were 0.92 bilaterally.  She is here today for further evaluation and management of ventricular tachycardia.  Past Medical History  Diagnosis Date  . Hypertension   . Hiatal hernia   . Glaucoma   . Depression   . Osteoporosis   . Hyperlipidemia   . Chronic kidney disease     KIDNEY STONES  . NSVT (nonsustained ventricular tachycardia) 10/18/2011  . HTN (hypertension) 10/18/2011  .  Hyperlipemia 10/18/2011  . PAD (peripheral artery disease) 10/18/2011    Past Surgical History  Procedure Date  . Cholecystectomy   . Abdominal hysterectomy     Family History  Problem Relation Age of Onset  . Cancer Sister     BREAST  . Cancer Brother     COLON   Social History:  reports that she has never smoked. She has never used smokeless tobacco. She reports that she does not drink alcohol or use illicit drugs. She is widowed and her grandson does live with her.  Allergies:  Allergies  Allergen Reactions  . Crestor (Rosuvastatin Calcium)     Muscle pain  . Lipitor (Atorvastatin Calcium)     Muscle pain    Outpatient Medications: 1. Plavix 75 mg daily 2. Aspirin 81 mg daily. 3. Losartan/HCTZ 100/25 daily 4. Citalopram 10 mg daily 5. Metoprolol tartrate 25 mg twice a day 6. Amlodipine 10 mg by mouth daily 7. Zetia 10 mg daily 8. Lovaza 1 g daily 9. Livalo 2 mg daily 10. Timolol eyedrops 0.5% one drop both eyes every 12 hours at 7 AM and 7 PM 11. Pletal 1 as needed when necessary 12. Tessalon Perles as needed.     Results for orders placed during the hospital encounter of 10/18/11 (from the past 48 hour(s))  PRO B NATRIURETIC PEPTIDE     Status: Normal   Collection Time   10/18/11  8:02 PM      Component Value Range Comment   BNP, POC 93.7  0 - 450 (pg/mL)   COMPREHENSIVE METABOLIC PANEL     Status: Abnormal   Collection Time   10/18/11  8:05 PM      Component Value Range Comment   Sodium 143  135 - 145 (mEq/L)    Potassium 3.5  3.5 - 5.1 (mEq/L)    Chloride 104  96 - 112 (mEq/L)    CO2 30  19 - 32 (mEq/L)    Glucose, Bld 122 (*) 70 - 99 (mg/dL)    BUN 28 (*) 6 - 23 (mg/dL)    Creatinine, Ser 3.66 (*) 0.50 - 1.10 (mg/dL)    Calcium 44.0  8.4 - 10.5 (mg/dL)    Total Protein 6.7  6.0 - 8.3 (g/dL)    Albumin 3.8  3.5 - 5.2 (g/dL)    AST 17  0 - 37 (U/L)    ALT 14  0 - 35 (U/L)    Alkaline Phosphatase 44  39 - 117 (U/L)    Total Bilirubin 0.5  0.3 - 1.2  (mg/dL)    GFR calc non Af Amer 41 (*) >90 (mL/min)    GFR calc Af Amer 47 (*) >90 (mL/min)   CARDIAC PANEL(CRET KIN+CKTOT+MB+TROPI)     Status: Normal   Collection Time   10/18/11  8:05 PM      Component Value Range Comment   Total CK 61  7 - 177 (U/L)    CK, MB 2.5  0.3 - 4.0 (ng/mL)    Troponin I <0.30  <0.30 (ng/mL)    Relative Index RELATIVE INDEX IS INVALID  0.0 - 2.5    MAGNESIUM     Status: Normal   Collection Time   10/18/11  8:05 PM      Component Value Range Comment   Magnesium 2.2  1.5 - 2.5 (mg/dL)   PROTIME-INR     Status: Normal   Collection Time   10/18/11  8:05 PM      Component Value Range Comment   Prothrombin Time 13.5  11.6 - 15.2 (seconds)    INR 1.01  0.00 - 1.49     Dg Chest Port 1 View  10/18/2011  *RADIOLOGY REPORT*  Clinical Data: Dizziness; ventricular tachycardia.  PORTABLE CHEST - 1 VIEW  Comparison: Chest radiograph performed 09/29/2011  Findings: The lungs are well-aerated and clear.  There is no evidence of focal opacification, pleural effusion or pneumothorax.  The cardiomediastinal silhouette is normal in size; calcification is noted within the aortic arch.  No acute osseous abnormalities are seen.  IMPRESSION: No acute cardiopulmonary process seen.  Original Report Authenticated By: Tonia Ghent, M.D.    ROS: Gen.: No colds or fevers. Skin: No rashes or ulcers. HEENT: No blurred vision or double vision. Cardiovascular: No chest pain,  no awareness of heartbeat.  Pulmonary: Denies shortness of breath. GI: No diarrhea constipation or melena. GU: No dysuria or hematuria. Musculoskeletal: Complains of pain along her shins on both lower extremities from the knee caps down.  Otherwise, moves all 4 extremities, 5/5 strength. Neuro: No lightheadedness no dizziness no weakness in any extremity. No slurred speech.   Blood pressure 123/68, pulse 70, temperature 97.8 F (36.6 C), temperature source Oral, resp. rate 19, SpO2 98.00%. PE: General: Alert  oriented African American female pleasant affect no acute distress. Skin: Warm and dry brisk capillary refill. HEENT: Normocephalic sclera clear positive facial symmetry. Neck: Supple no JVD no carotid bruits. Lungs: Clear without rales rhonchi or wheezes. Heart: S1-S2 regular rate and rhythm  without murmur gallop rub or click. Abdomen: Soft nontender positive bowel sounds do not palpate liver spleen or masses. Extremities: No lower extremity edema 1+ pedal pulses, mild tenderness along the shins. Neuro: Positive facial symmetry with smile and frown, tongue midline. Equal grips of upper extremities equal strength with push pull of lower extremities is all extremities on command. Alert and oriented x3.  Assessment/Plan Patient Active Problem List  Diagnoses  . Syncope  . Cough  . Fatigue  . Fall  . Pelvic joint pain  . Back pain  . Need for prophylactic vaccination and inoculation against influenza  . NSVT (nonsustained ventricular tachycardia)  . HTN (hypertension)  . Hyperlipemia  . PAD (peripheral artery disease)   Plan: Admitting for monitoring purposes and further evaluation of nonsustained ventricular tachycardia that may have caused syncope last week. Her laboratory data is stable with magnesium level of 2.2, potassium 3.5 we will give her 20 mEq tonight cardiac enzymes are negative  Other labs are pending.  EKG sinus rhythm first degree AV block with a PR interval of 232 ms nonspecific ST changes laterally and inferiorly seen back on EKG of 09/23/2011 have actually improved.  Portable chest x-ray, no acute cardiopulmonary process.  We'll plan 2-D echo tomorrow and further testing at the recommendation of Dr. Herbie Baltimore. Patient and her daughter are agreeable to these procedures.  INGOLD,LAURA R 10/18/2011, 9:27 PM  I have seen and examined the patient along with Nada Boozer, NP.  I have reviewed the chart, notes and new data.  I agree with Laura's note.  Mrs. Tortorelli has  not had any further syncopal events.  She has had documented Non-sustained wide complex tachycardia - most likely NSVT, as well as at least one documented narrow complex tachycardia run that is either atrial tachycardia or supraventricular tachycardia.   She has not had IV an echocardiogram or stress testing or 4 years. Per Dr. Landry Dyke last clinic note, was of some concern of possible chest tightness as a symptom. He mentioned checking a myocardial perfusion stress test that has not yet been done.  With normal laboratory values, we will need to determine presence of either structural cardiac abnormality versus cardiac ischemia as a source of wide complex tachycardia.  I will order an echocardiogram as well as a Scientist, physiological for tomorrow, but will discuss with Dr. Tresa Endo prior to proceeding with the Myoview to see if there was plans to perform cardiac catheterization. Continue beta blocker along with other home medications. Pending initial evaluation would consider possible electrophysiology consultation.  Further plans will be discussed with Dr. Tresa Endo.   Marykay Lex, M.D., M.S. [facility] [address] [place], [place]  [address]  [phone]  10/18/2011 10:35 PM

## 2011-10-19 ENCOUNTER — Encounter (HOSPITAL_COMMUNITY): Payer: Self-pay

## 2011-10-19 ENCOUNTER — Inpatient Hospital Stay (HOSPITAL_COMMUNITY): Payer: Medicare Other

## 2011-10-19 ENCOUNTER — Other Ambulatory Visit: Payer: Self-pay

## 2011-10-19 LAB — CBC
HCT: 38.1 % (ref 36.0–46.0)
Hemoglobin: 12.7 g/dL (ref 12.0–15.0)
MCH: 30.3 pg (ref 26.0–34.0)
MCHC: 33.3 g/dL (ref 30.0–36.0)
MCV: 90.9 fL (ref 78.0–100.0)
Platelets: 209 10*3/uL (ref 150–400)
RBC: 4.19 MIL/uL (ref 3.87–5.11)
RDW: 12.9 % (ref 11.5–15.5)
WBC: 6.8 10*3/uL (ref 4.0–10.5)

## 2011-10-19 LAB — CARDIAC PANEL(CRET KIN+CKTOT+MB+TROPI)
CK, MB: 2.3 ng/mL (ref 0.3–4.0)
CK, MB: 2.2 ng/mL (ref 0.3–4.0)
Relative Index: INVALID (ref 0.0–2.5)
Relative Index: INVALID (ref 0.0–2.5)
Total CK: 58 U/L (ref 7–177)
Total CK: 59 U/L (ref 7–177)
Troponin I: <0.3 ng/mL (ref <0.30)
Troponin I: <0.3 ng/mL (ref <0.30)

## 2011-10-19 LAB — BASIC METABOLIC PANEL
BUN: 23 mg/dL (ref 6–23)
CO2: 28 mEq/L (ref 19–32)
Calcium: 9.6 mg/dL (ref 8.4–10.5)
Chloride: 105 mEq/L (ref 96–112)
Creatinine, Ser: 0.96 mg/dL (ref 0.50–1.10)
GFR calc Af Amer: 62 mL/min — ABNORMAL LOW (ref >90)
GFR calc non Af Amer: 53 mL/min — ABNORMAL LOW (ref >90)
Glucose, Bld: 100 mg/dL — ABNORMAL HIGH (ref 70–99)
Potassium: 3.1 mEq/L — ABNORMAL LOW (ref 3.5–5.1)
Sodium: 142 mEq/L (ref 135–145)

## 2011-10-19 LAB — URINALYSIS, ROUTINE W REFLEX MICROSCOPIC
Bilirubin Urine: NEGATIVE
Glucose, UA: NEGATIVE mg/dL
Hgb urine dipstick: NEGATIVE
Ketones, ur: NEGATIVE mg/dL
Nitrite: NEGATIVE
Protein, ur: NEGATIVE mg/dL
Specific Gravity, Urine: 1.018 (ref 1.005–1.030)
Urobilinogen, UA: 1 mg/dL (ref 0.0–1.0)
pH: 6 (ref 5.0–8.0)

## 2011-10-19 LAB — URINE MICROSCOPIC-ADD ON

## 2011-10-19 LAB — HEMOGLOBIN A1C
Hgb A1c MFr Bld: 5.6 % (ref <5.7)
Mean Plasma Glucose: 114 mg/dL (ref <117)

## 2011-10-19 LAB — TSH: TSH: 1.762 u[IU]/mL (ref 0.350–4.500)

## 2011-10-19 MED ORDER — POTASSIUM CHLORIDE CRYS ER 20 MEQ PO TBCR
40.0000 meq | EXTENDED_RELEASE_TABLET | Freq: Once | ORAL | Status: AC
  Administered 2011-10-19: 40 meq via ORAL
  Filled 2011-10-19: qty 1

## 2011-10-19 MED ORDER — TECHNETIUM TC 99M TETROFOSMIN IV KIT
30.0000 | PACK | Freq: Once | INTRAVENOUS | Status: AC | PRN
  Administered 2011-10-19: 30 via INTRAVENOUS

## 2011-10-19 MED ORDER — REGADENOSON 0.4 MG/5ML IV SOLN
0.4000 mg | Freq: Once | INTRAVENOUS | Status: AC
  Administered 2011-10-19: 0.4 mg via INTRAVENOUS
  Filled 2011-10-19: qty 5

## 2011-10-19 MED ORDER — INFLUENZA VIRUS VACC SPLIT PF IM SUSP
0.5000 mL | INTRAMUSCULAR | Status: AC
  Administered 2011-10-20: 0.5 mL via INTRAMUSCULAR
  Filled 2011-10-19: qty 0.5

## 2011-10-19 MED ORDER — TECHNETIUM TC 99M TETROFOSMIN IV KIT
10.0000 | PACK | Freq: Once | INTRAVENOUS | Status: AC | PRN
  Administered 2011-10-19: 10 via INTRAVENOUS

## 2011-10-19 NOTE — Progress Notes (Signed)
Discussed the results of the stress test with the patient which was negative. Will review echo. Suspect she is okay with discharge home on metoprolol. Follow-up in our office.   Chrystie Nose, MD Attending Cardiologist [facility]

## 2011-10-19 NOTE — Plan of Care (Signed)
Problem: Phase I Progression Outcomes Goal: Arrhythmia controlled or corrected Outcome: Completed/Met Date Met:  10/19/11 SR HR 70's

## 2011-10-19 NOTE — Progress Notes (Signed)
  Echocardiogram 2D Echocardiogram has been performed.  Juanita Laster Lizzie An, RDCS 10/19/2011, 3:32 PM

## 2011-10-19 NOTE — Progress Notes (Signed)
Subjective: No chest pain. No complaints, though she did not rest well.  Admitted for nonsustained ventricular tachycardia with a history of syncope last week.  77 year old [place] American female patient of Dr. Devona Konig with recent history of syncope of last week, Holter monitor was applied and has revealed nonsustained ventricular tachycardia. The rate is 150 beats per minute. The longest run was 23 beats. The patient denies being aware of any tachycardia or palpitations. She also denies chest pain or shortness of breath.  Objective: Vital signs in last 24 hours: Temp:  [97.6 F (36.4 C)-98.5 F (36.9 C)] 97.6 F (36.4 C) (12/04 0550) Pulse Rate:  [69-82] 82  (12/04 0550) Resp:  [19-20] 19  (12/04 0550) BP: (112-130)/(61-68) 112/62 mmHg (12/04 0550) SpO2:  [94 %-98 %] 94 % (12/04 0550) Weight:  [57.8 kg (127 lb 6.8 oz)] 127 lb 6.8 oz (57.8 kg) (12/04 0550) Weight change:  Last BM Date: 10/18/11 Intake/Output from previous day: -492 12/03 0701 - 12/04 0700 In: 7.7 [I.V.:7.7] Out: 500 [Urine:500] Intake/Output this shift: Total I/O In: 240 [P.O.:240] Out: -   PE: General:A&O X 3, Pleasant affect. Heart:S1S2, RRR, no obvious murmur. Lungs:Clear. Abd:+bs, soft non-tender.  Ext:No edema.  TELE: 3 beats VTACH at 0155.  Lab Results:  [facility] 10/19/11 0438  WBC 6.8  HGB 12.7  HCT 38.1  PLT 209   BMET  Basename 10/19/11 0438 10/18/11 2005  NA 142 143  K 3.1* 3.5  CL 105 104  CO2 28 30  GLUCOSE 100* 122*  BUN 23 28*  CREATININE 0.96 1.20*  CALCIUM 9.6 10.3    Basename 10/19/11 0439 10/18/11 2005  TROPONINI <0.30 <0.30    No results found for this basename: CHOL, HDL, LDLCALC, LDLDIRECT, TRIG, CHOLHDL   Lab Results  Component Value Date   HGBA1C 5.6 10/18/2011     Lab Results  Component Value Date   TSH 1.762 10/18/2011    Hepatic Function Panel  Basename 10/18/11 2005  PROT 6.7  ALBUMIN 3.8  AST 17  ALT 14  ALKPHOS 44  BILITOT 0.5  BILIDIR --    IBILI --   No results found for this basename: CHOL in the last 72 hours No results found for this basename: PROTIME in the last 72 hours    EKG: Orders placed during the hospital encounter of 10/18/11  . EKG 12-LEAD  . EKG 12-LEAD    Studies/Results: Dg Chest Port 1 View  10/18/2011  *RADIOLOGY REPORT*  Clinical Data: Dizziness; ventricular tachycardia.  PORTABLE CHEST - 1 VIEW  Comparison: Chest radiograph performed 09/29/2011  Findings: The lungs are well-aerated and clear.  There is no evidence of focal opacification, pleural effusion or pneumothorax.  The cardiomediastinal silhouette is normal in size; calcification is noted within the aortic arch.  No acute osseous abnormalities are seen.  IMPRESSION: No acute cardiopulmonary process seen.  Original Report Authenticated By: Tonia Ghent, M.D.    Medications: I have reviewed the patient's current medications.  Assessment/Plan: Patient Active Problem List  Diagnoses  . Syncope  . Cough  . Fatigue  . Fall  . Pelvic joint pain  . Back pain  . Need for prophylactic vaccination and inoculation against influenza  . NSVT (nonsustained ventricular tachycardia)  . HTN (hypertension)  . Hyperlipemia  . PAD (peripheral artery disease)  HYPOKALEMIA  Plan: Lexiscan Myoview.  Replace K+. Cardiac enzymes neg. (NEG for MI)    Completed Lexiscan myoview without complications. 2DEcho is still to be done. Lovenox VtE  proph.  1- - 3 beat run NSVT early am.  See Dr. Elissa Hefty admit note.   LOS: 1 day   Sostenes Kauffmann R 10/19/2011, 12:14 PM

## 2011-10-19 NOTE — Plan of Care (Signed)
Problem: Phase I Progression Outcomes Goal: Electrolytes normal or corrected Outcome: Completed/Met Date Met:  10/19/11 Electrolytes WNL. Creatin & BUN elevated.

## 2011-10-19 NOTE — Plan of Care (Signed)
Problem: Phase I Progression Outcomes Goal: Pain controlled with appropriate interventions Outcome: Completed/Met Date Met:  10/19/11 0/10 pain since arrival and no pain medication

## 2011-10-20 MED ORDER — EZETIMIBE 10 MG PO TABS: 10.0000 mg | ORAL_TABLET | Freq: Every day | ORAL | Status: DC

## 2011-10-20 MED ORDER — TIMOLOL MALEATE 0.5 % OP SOLN: 1.0000 [drp] | Freq: Two times a day (BID) | OPHTHALMIC | Status: AC

## 2011-10-20 MED ORDER — OMEGA-3-ACID ETHYL ESTERS 1 G PO CAPS: 1.0000 g | ORAL_CAPSULE | Freq: Two times a day (BID) | ORAL | Status: DC

## 2011-10-20 MED ORDER — METOPROLOL TARTRATE 25 MG PO TABS: ORAL_TABLET | ORAL | Status: DC

## 2011-10-20 NOTE — Progress Notes (Signed)
Patient ID: Julie Bass, female   DOB: Mar 14, 1926, 77 y.o.   MRN: [id number]   [facility]  Subjective: Patient feels weak from the knees down.  She has to hold onto something to get around.  She complains of occasional lumbar pain.  No dizziness. No numbness or paresthesia in her lower extremities.  Objective: Vital signs in last 24 hours: Temp:  [97.8 F (36.6 C)] 97.8 F (36.6 C) (12/05 0337) Pulse Rate:  [55-90] 55  (12/05 0337) Resp:  [18] 18  (12/05 0337) BP: (104-137)/(49-84) 107/68 mmHg (12/05 0337) SpO2:  [96 %-98 %] 98 % (12/05 0337) Last BM Date: 10/18/11  Intake/Output from previous day: 12/04 0701 - 12/05 0700 In: 240 [P.O.:240] Out: 350 [Urine:350] Intake/Output this shift:    Medications Current Facility-Administered Medications  Medication Dose Route Frequency Provider Last Rate Last Dose  . 0.9 %  sodium chloride infusion   Intravenous Continuous Leone Brand, NP      . acetaminophen (TYLENOL) tablet 650 mg  650 mg Oral Q6H PRN Leone Brand, NP       Or  . acetaminophen (TYLENOL) suppository 650 mg  650 mg Rectal Q6H PRN Leone Brand, NP      . alum & mag hydroxide-simeth (MAALOX/MYLANTA) 200-200-20 MG/5ML suspension 30 mL  30 mL Oral Q6H PRN Leone Brand, NP      . amLODipine (NORVASC) tablet 10 mg  10 mg Oral Daily Leone Brand, NP   10 mg at 10/19/11 1436  . aspirin chewable tablet 81 mg  81 mg Oral Daily Leone Brand, NP   81 mg at 10/19/11 1435  . benzonatate (TESSALON) capsule 100 mg  100 mg Oral Q6H PRN Leone Brand, NP      . citalopram (CELEXA) tablet 10 mg  10 mg Oral Daily Leone Brand, NP   10 mg at 10/19/11 1436  . clopidogrel (PLAVIX) tablet 75 mg  75 mg Oral Daily Leone Brand, NP   75 mg at 10/19/11 1437  . docusate sodium (COLACE) capsule 100 mg  100 mg Oral BID Leone Brand, NP   100 mg at 10/19/11 2146  . enoxaparin (LOVENOX) injection 30 mg  30 mg Subcutaneous QHS Leone Brand, NP   30  mg at 10/19/11 2147  . ezetimibe (ZETIA) tablet 10 mg  10 mg Oral Daily Leone Brand, NP   10 mg at 10/19/11 1437  . hydrochlorothiazide (HYDRODIURIL) tablet 25 mg  25 mg Oral Daily Gary Fleet Abbott, PHARMD   25 mg at 10/19/11 1435  . influenza  inactive virus vaccine (FLUZONE/FLUARIX) injection 0.5 mL  0.5 mL Intramuscular Tomorrow-1000 Lennette Bihari, MD      . losartan (COZAAR) tablet 100 mg  100 mg Oral Daily Gary Fleet Abbott, PHARMD   100 mg at 10/19/11 1435  . metoprolol tartrate (LOPRESSOR) tablet 25 mg  25 mg Oral BID Leone Brand, NP   25 mg at 10/19/11 1436  . omega-3 acid ethyl esters (LOVAZA) capsule 1 g  1 g Oral BID Leone Brand, NP   1 g at 10/19/11 2148  . ondansetron (ZOFRAN) tablet 4 mg  4 mg Oral Q6H PRN Leone Brand, NP       Or  . ondansetron [facility]) injection 4 mg  4 mg Intravenous Q6H PRN Leone Brand, NP      . potassium chloride SA (K-DUR,KLOR-CON) CR tablet 40  mEq  40 mEq Oral Once Leone Brand, NP   40 mEq at 10/19/11 1434  . regadenoson (LEXISCAN) injection SOLN 0.4 mg  0.4 mg Intravenous Once Lennette Bihari, MD   0.4 mg at 10/19/11 1228  . technetium tetrofosmin (TC-MYOVIEW) injection 10 milli Curie  10 milli Curie Intravenous Once PRN Medication Radiologist   10 milli Curie at 10/19/11 1055  . technetium tetrofosmin (TC-MYOVIEW) injection 30 milli Curie  30 milli Curie Intravenous Once PRN Medication Radiologist   30 milli Curie at 10/19/11 1225  . timolol (TIMOPTIC) 0.5 % ophthalmic solution 1 drop  1 drop Both Eyes BID Leone Brand, NP   1 drop at 10/19/11 2148  . zolpidem (AMBIEN) tablet 5 mg  5 mg Oral QHS PRN Leone Brand, NP      . DISCONTD: simvastatin (ZOCOR) tablet 5 mg  5 mg Oral q1800 Leone Brand, NP        PE: General appearance: alert, cooperative and no distress Lungs: clear to auscultation bilaterally Heart: regular rate and rhythm and Soft systolic MM Extremities: No LEE. Pulses: 2+ and symmetric Neurologic: Strength  good- foot dorsiflexion, leg extension and flexion.  Lab Results:   [facility] 10/19/11 0438  WBC 6.8  HGB 12.7  HCT 38.1  PLT 209   BMET  Basename 10/19/11 0438 10/18/11 2005  NA 142 143  K 3.1* 3.5  CL 105 104  CO2 28 30  GLUCOSE 100* 122*  BUN 23 28*  CREATININE 0.96 1.20*  CALCIUM 9.6 10.3   PT/INR  Basename 10/18/11 2005  LABPROT 13.5  INR 1.01     Studies/Results:  MYOCARDIAL IMAGING WITH SPECT (REST AND PHARMACOLOGIC-STRESS)  GATED LEFT VENTRICULAR WALL MOTION STUDY  LEFT VENTRICULAR EJECTION FRACTION  Technique: Standard myocardial SPECT imaging was performed after  resting intravenous injection of 10 mCi Tc-22m tetrofosmin.  Subsequently, intravenous infusion of regadenoson was performed  under the supervision of the Cardiology staff. At peak effect of  the drug, 30 mCi Tc-20m tetrofosmin was injected intravenously and  standard myocardial SPECT imaging was performed. Quantitative  gated imaging was also performed to evaluate left ventricular wall  motion, and estimate left ventricular ejection fraction.  Comparison: None.  Findings: Immediate post-regadenoson images demonstrate no focal  myocardial perfusion abnormality. Initial resting images with  similar findings. No evidence of reversibility to suggest  ischemia. Findings confirmed by the computer generated polar map.  Gated images demonstrate satisfactory thickening throughout the  left ventricular myocardium with normal wall motion throughout.  Estimated Q G S ejection fraction measured 82%, with an end-  diastolic volume of 44 ml and an end-systolic volume of 8 ml.  IMPRESSION:  1. No evidence of myocardial ischemia or infarction. Normal  myocardial perfusion study.  2. Normal left ventricular wall motion.  3. Estimated Q G S ejection fraction 82%, likely overestimated.  Assessment/Plan Principal Problem:  *NSVT (nonsustained ventricular tachycardia) Active Problems:  Syncope  HTN  (hypertension)  Hyperlipemia  PAD (peripheral artery disease)  Plan:  No events overnight.  Pt does not feel her usual self.  BP/HR stable.  Negative NST yesterday.     LOS: 2 days    HAGER,BRYAN W 10/20/2011 7:49 AM     Patient seen and examined. Agree with assessment and plan. Echo shows nl LV fxn with Grade I diastolic dysfunction. Nl myoview.  No further tachydysrhymia.  Will d/c/ today on metoprolol 37.5 mg in am and 25 mg in pm adjustment.  Continue  cardionet monitoring as outpt.  Lennette Bihari, MD, [facility] 10/20/2011 1:50 PM

## 2011-10-25 NOTE — Discharge Summary (Signed)
Physician Discharge Summary  Patient ID: Julie Bass MRN: [id number] DOB/AGE: 21-Feb-1926 77 y.o.  Admit date: 10/18/2011 Discharge date: 10/25/2011  Admission Diagnoses:  Discharge Diagnoses:  Principal Problem:  *NSVT (nonsustained ventricular tachycardia) Active Problems:  Syncope  HTN (hypertension)  Hyperlipemia  PAD (peripheral artery disease)  Hypokalemia  Discharged Condition: stable  Hospital Course:  77 year old African American female patient of Dr. Devona Konig with a history of hypertension, significant hyperlipidemia, as well as peripheral vascular disease. She has had left SFA stenting by Dr. Allyson Sabal and documented occlusive disease distally. Most recent duplex imaging study was August 2012 with moderate diffuse mixed density plaque throughout the right SFA with narrowings of 50-69%. She has occlusion of the right PDA and peroneal vessels. Her left SFA stent remained patent. Chest occlusion of her left PTA and left ATA with reconstitution at the dorsalis pedis artery. ABIs were 0.92 bilaterally.   She has a recent history of syncope of last week, Holter monitor was applied and revealed nonsustained ventricular tachycardia. The rate was 150 beats per minute. The longest run was 23 beats. The patient denied being aware of any tachycardia or palpitations. She also denied chest pain or shortness of breath. The patient had a syncopal episode last week after having her evening meal she walked to the Sink And then collapsed to the floor.She did not know how long she was out, she could have been out for up to an hour. She did not lose bowel or bladder control. She did not have any chest pain at that time nor she was aware she was going to pass out. She did have some pelvic pain post syncopal episode and was evaluated by physician.  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 The patient has She is here today for further evaluation and management of ventricular tachycardia.  The  patient was set up for [facility] which revealed no evidence of myocardial ischemia or infarction. Normal myocardial perfusion study. Normal left ventricular wall motion. Estimated Q G S ejection fraction 82%, likely overestimated.  Potassium was repleted.  The patient was discharged on increase lopressor. 37.5mg  QAM and 25mg  QPM.   She was seen by Dr. Tresa Endo who felt she was ready for discharge home.  Significant Diagnostic Studies:   PORTABLE CHEST - 1 VIEW  Comparison: Chest radiograph performed 09/29/2011  Findings: The lungs are well-aerated and clear. There is no  evidence of focal opacification, pleural effusion or pneumothorax.  The cardiomediastinal silhouette is normal in size; calcification  is noted within the aortic arch. No acute osseous abnormalities  are seen.  IMPRESSION:  No acute cardiopulmonary process seen.  MYOCARDIAL IMAGING WITH SPECT (REST AND PHARMACOLOGIC-STRESS)  GATED LEFT VENTRICULAR WALL MOTION STUDY  LEFT VENTRICULAR EJECTION FRACTION  Technique: Standard myocardial SPECT imaging was performed after  resting intravenous injection of 10 mCi Tc-51m tetrofosmin.  Subsequently, intravenous infusion of regadenoson was performed  under the supervision of the Cardiology staff. At peak effect of  the drug, 30 mCi Tc-17m tetrofosmin was injected intravenously and  standard myocardial SPECT imaging was performed. Quantitative  gated imaging was also performed to evaluate left ventricular wall  motion, and estimate left ventricular ejection fraction.  Comparison: None.  Findings: Immediate post-regadenoson images demonstrate no focal  myocardial perfusion abnormality. Initial resting images with  similar findings. No evidence of reversibility to suggest  ischemia. Findings confirmed by the computer generated polar map.  Gated images demonstrate satisfactory thickening throughout the  left ventricular myocardium with  normal wall motion throughout.  Estimated  Q G S ejection fraction measured 82%, with an end-  diastolic volume of 44 ml and an end-systolic volume of 8 ml.  IMPRESSION:  1. No evidence of myocardial ischemia or infarction. Normal  myocardial perfusion study.  2. Normal left ventricular wall motion.  3. Estimated Q G S ejection fraction 82%, likely overestimated.    Discharge Exam: Blood pressure 112/69, pulse 69, temperature 98 F (36.7 C), temperature source Oral, resp. rate 20, height 5\' 3"  (1.6 m), weight 57.8 kg (127 lb 6.8 oz), SpO2 99.00%.  Disposition: Home or Self Care  Discharge Orders    Future Orders Please Complete By Expires   Diet - low sodium heart healthy      Increase activity slowly        Discharge Medication List as of 10/20/2011  5:51 PM    START taking these medications   Details  ezetimibe (ZETIA) 10 MG tablet Take 1 tablet (10 mg total) by mouth daily., Starting 10/20/2011, Until Thu 10/19/12, Print    metoprolol tartrate (LOPRESSOR) 25 MG tablet 37.5 mg in the morning 25.0 mg in the evening, Print    omega-3 acid ethyl esters (LOVAZA) 1 G capsule Take 1 capsule (1 g total) by mouth 2 (two) times daily., Starting 10/20/2011, Until Thu 10/19/12, Print    timolol (TIMOPTIC) 0.5 % ophthalmic solution Place 1 drop into both eyes 2 (two) times daily., Starting 10/20/2011, Until Thu 10/19/12, Print      CONTINUE these medications which have NOT CHANGED   Details  amLODipine (NORVASC) 10 MG tablet Take 10 mg by mouth daily.  , Until Discontinued, Historical Med    aspirin 325 MG tablet Take 81 mg by mouth daily. , Until Discontinued, Historical Med    benzonatate (TESSALON PERLES) 100 MG capsule Take 1 capsule (100 mg total) by mouth every 6 (six) hours as needed for cough., Starting 09/30/2011, Until Fri 09/29/12, Normal    citalopram (CELEXA) 10 MG tablet Take 10 mg by mouth daily.  , Until Discontinued, Historical Med    clopidogrel (PLAVIX) 75 MG tablet Take 75 mg by mouth daily.  , Until  Discontinued, Historical Med    esomeprazole (NEXIUM) 40 MG capsule Take 40 mg by mouth daily before breakfast.  , Until Discontinued, Historical Med    losartan-hydrochlorothiazide (HYZAAR) 100-25 MG per tablet Take 1 tablet by mouth daily.  , Until Discontinued, Historical Med      STOP taking these medications     amLODipine-atorvastatin (CADUET) 10-40 MG per tablet      valsartan-hydrochlorothiazide (DIOVAN-HCT) 160-25 MG per tablet          Signed: Zhavia Cunanan Bass 10/25/2011, 2:56 PM

## 2011-11-16 HISTORY — PX: OTHER SURGICAL HISTORY: SHX169

## 2012-02-09 ENCOUNTER — Other Ambulatory Visit: Payer: Self-pay | Admitting: Family Medicine

## 2012-02-09 DIAGNOSIS — Z1231 Encounter for screening mammogram for malignant neoplasm of breast: Secondary | ICD-10-CM

## 2012-02-16 ENCOUNTER — Ambulatory Visit
Admission: RE | Admit: 2012-02-16 | Discharge: 2012-02-16 | Disposition: A | Discharge status: Home / Self Care | Payer: Medicare Other | Source: Ambulatory Visit | Attending: Family Medicine | Admitting: Family Medicine

## 2012-02-16 DIAGNOSIS — Z1231 Encounter for screening mammogram for malignant neoplasm of breast: Secondary | ICD-10-CM

## 2012-07-20 ENCOUNTER — Encounter (HOSPITAL_COMMUNITY): Admission: RE | Payer: Self-pay | Source: Ambulatory Visit

## 2012-07-20 ENCOUNTER — Ambulatory Visit (HOSPITAL_COMMUNITY): Admission: RE | Admit: 2012-07-20 | Payer: Medicare Other | Source: Ambulatory Visit | Admitting: Cardiovascular Disease

## 2012-07-20 SURGERY — ANGIOGRAM, LOWER EXTREMITY
Anesthesia: LOCAL

## 2012-07-25 ENCOUNTER — Ambulatory Visit (INDEPENDENT_AMBULATORY_CARE_PROVIDER_SITE_OTHER): Payer: Medicare Other | Admitting: Family Medicine

## 2012-07-25 ENCOUNTER — Encounter: Payer: Self-pay | Admitting: Family Medicine

## 2012-07-25 VITALS — BP 118/68 | HR 64 | Wt 136.0 lb

## 2012-07-25 DIAGNOSIS — R221 Localized swelling, mass and lump, neck: Secondary | ICD-10-CM

## 2012-07-25 DIAGNOSIS — R220 Localized swelling, mass and lump, head: Secondary | ICD-10-CM

## 2012-07-25 DIAGNOSIS — R413 Other amnesia: Secondary | ICD-10-CM

## 2012-07-25 MED ORDER — DONEPEZIL HCL 5 MG PO TABS: 5.0000 mg | ORAL_TABLET | Freq: Every evening | ORAL | Status: DC | PRN

## 2012-07-25 NOTE — Progress Notes (Signed)
  Subjective:    Patient ID: Julie Bass, female    DOB: June 02, 1926, 78 y.o.   MRN: [id number]  HPI She is here for evaluation of a mass in the left side of her neck it has been there for several months. She occasionally does have some difficulty with this. No trouble with swallowing. She does complain of slight earache on occasion. Also her daughter mentions difficulty with memory. She apparently has had difficulty with driving forgetting where she is going. She also has short-term memory not remembering something several hours later.   Review of Systems     Objective:   Physical Exam Alert and in no distress. 3 cm round smooth movable lesion noted in the left submandibular area. No other lesions were palpable. Throat is clear. MMSE A3.       Assessment & Plan:   1. Memory loss    2. 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 I will place her on Aricept and follow up in one month. Followup on the mass depending upon the results of the ultrasound

## 2012-07-26 ENCOUNTER — Other Ambulatory Visit: Payer: Medicare Other

## 2012-07-26 ENCOUNTER — Ambulatory Visit
Admission: RE | Admit: 2012-07-26 | Discharge: 2012-07-26 | Disposition: A | Discharge status: Home / Self Care | Payer: Medicare Other | Source: Ambulatory Visit | Attending: Family Medicine | Admitting: Family Medicine

## 2012-07-26 DIAGNOSIS — R221 Localized swelling, mass and lump, neck: Secondary | ICD-10-CM

## 2012-07-27 ENCOUNTER — Telehealth: Payer: Self-pay | Admitting: Family Medicine

## 2012-07-27 NOTE — Telephone Encounter (Signed)
Daughter called, Burna Mortimer requested that she be called with results from neck ultrasound  [phone]  Also needs copy of last ov note and [place] results faxed to Dr. Allyson Sabal @ [phone]

## 2012-07-27 NOTE — Telephone Encounter (Signed)
Go ahead and fax something over to the number . Also set her up to be seen by ENT

## 2012-07-27 NOTE — Telephone Encounter (Signed)
Already done

## 2012-08-01 ENCOUNTER — Other Ambulatory Visit (HOSPITAL_COMMUNITY)
Admission: RE | Admit: 2012-08-01 | Discharge: 2012-08-01 | Disposition: A | Discharge status: Home / Self Care | Payer: Medicare Other | Source: Ambulatory Visit | Attending: Otolaryngology | Admitting: Otolaryngology

## 2012-08-01 ENCOUNTER — Other Ambulatory Visit: Payer: Self-pay | Admitting: Otolaryngology

## 2012-08-01 DIAGNOSIS — R221 Localized swelling, mass and lump, neck: Secondary | ICD-10-CM | POA: Insufficient documentation

## 2012-08-01 DIAGNOSIS — R220 Localized swelling, mass and lump, head: Secondary | ICD-10-CM | POA: Insufficient documentation

## 2012-08-21 ENCOUNTER — Telehealth: Payer: Self-pay | Admitting: Family Medicine

## 2012-08-21 MED ORDER — RIVASTIGMINE TARTRATE 1.5 MG PO CAPS: 1.5000 mg | ORAL_CAPSULE | Freq: Two times a day (BID) | ORAL | Status: DC

## 2012-08-21 NOTE — Telephone Encounter (Signed)
Aricept was causing difficulty with diarrhea. I will switch to Exelon

## 2012-08-23 ENCOUNTER — Telehealth: Payer: Self-pay | Admitting: Family Medicine

## 2012-08-23 NOTE — Telephone Encounter (Signed)
Patient's daughter, Burna Mortimer, called stating that she ad called on Monday because her mother was having diarrhea from the Aricept and she has not received a call back. Informed her medication was changed to Exelon and sent to [facility]. She rescheduled her follow up appointment for 09/26/12 , so she would have been on medication for 1 month

## 2012-08-24 ENCOUNTER — Encounter: Payer: Self-pay | Admitting: Internal Medicine

## 2012-08-29 ENCOUNTER — Ambulatory Visit: Payer: Medicare Other | Admitting: Family Medicine

## 2012-08-31 ENCOUNTER — Ambulatory Visit: Payer: Medicare Other | Admitting: Family Medicine

## 2012-09-26 ENCOUNTER — Ambulatory Visit: Payer: Medicare Other | Admitting: Family Medicine

## 2012-10-15 HISTORY — PX: TRANSTHORACIC ECHOCARDIOGRAM: SHX275

## 2012-10-31 ENCOUNTER — Other Ambulatory Visit (HOSPITAL_COMMUNITY): Payer: Self-pay | Admitting: Cardiovascular Disease

## 2012-10-31 DIAGNOSIS — I472 Ventricular tachycardia: Secondary | ICD-10-CM

## 2012-10-31 DIAGNOSIS — I10 Essential (primary) hypertension: Secondary | ICD-10-CM

## 2012-11-10 ENCOUNTER — Ambulatory Visit (HOSPITAL_COMMUNITY)
Admission: RE | Admit: 2012-11-10 | Discharge: 2012-11-10 | Disposition: A | Discharge status: Home / Self Care | Payer: Medicare Other | Source: Ambulatory Visit | Attending: Cardiovascular Disease | Admitting: Cardiovascular Disease

## 2012-11-10 DIAGNOSIS — I10 Essential (primary) hypertension: Secondary | ICD-10-CM | POA: Insufficient documentation

## 2012-11-10 DIAGNOSIS — I472 Ventricular tachycardia, unspecified: Secondary | ICD-10-CM | POA: Insufficient documentation

## 2012-11-10 DIAGNOSIS — R55 Syncope and collapse: Secondary | ICD-10-CM

## 2012-11-10 DIAGNOSIS — I4729 Other ventricular tachycardia: Secondary | ICD-10-CM | POA: Insufficient documentation

## 2012-11-10 DIAGNOSIS — E785 Hyperlipidemia, unspecified: Secondary | ICD-10-CM | POA: Insufficient documentation

## 2012-11-10 NOTE — Progress Notes (Signed)
[facility]   2D echo completed 11/10/2012.   Cindy Manessa Buley, RDCS   

## 2012-12-22 ENCOUNTER — Ambulatory Visit
Admission: RE | Admit: 2012-12-22 | Discharge: 2012-12-22 | Disposition: A | Discharge status: Home / Self Care | Payer: Medicare Other | Source: Ambulatory Visit | Attending: Cardiovascular Disease | Admitting: Cardiovascular Disease

## 2012-12-22 ENCOUNTER — Other Ambulatory Visit: Payer: Self-pay | Admitting: Cardiovascular Disease

## 2012-12-22 DIAGNOSIS — W19XXXA Unspecified fall, initial encounter: Secondary | ICD-10-CM

## 2012-12-22 DIAGNOSIS — M25519 Pain in unspecified shoulder: Secondary | ICD-10-CM

## 2012-12-25 ENCOUNTER — Ambulatory Visit (INDEPENDENT_AMBULATORY_CARE_PROVIDER_SITE_OTHER): Payer: Medicare Other | Admitting: Family Medicine

## 2012-12-25 ENCOUNTER — Encounter: Payer: Self-pay | Admitting: Family Medicine

## 2012-12-25 VITALS — BP 142/100 | HR 64 | Temp 97.5°F | Ht 63.0 in | Wt 131.0 lb

## 2012-12-25 DIAGNOSIS — S20219A Contusion of unspecified front wall of thorax, initial encounter: Secondary | ICD-10-CM

## 2012-12-25 DIAGNOSIS — R0789 Other chest pain: Secondary | ICD-10-CM

## 2012-12-25 DIAGNOSIS — R071 Chest pain on breathing: Secondary | ICD-10-CM

## 2012-12-25 NOTE — Progress Notes (Signed)
Chief Complaint  Patient presents with  . Flank Pain    left side pain x several weeks. Had a fall several weeks ago and fell on her right shoulder. Her side has been hurting every since and right shoulder is hurting as well. Daughter, Claiborne Rigg gave her a celebrex 200mg  last night. Spoke with her daughter and she saw Dr.Kelley on Friday but OV note is not ready and she had Xray of shoulder and is scheduled to see the PA @ [place] [facility] this Thurs @ 3:30pm for her shoulder.   HPI:  She fell 3 weeks ago, on the way back from the bathroom to the kitchen.  She fell onto the air vent, onto the right shoulder.  She is complaining of R shoulder pain and L side pain since the fall.  She recalls feeling a pop on her left side, about 2 weeks ago, when leaning forward over railing to empty her trash.  Has been hurting every since.  She has a h/o falls, including "falling out", sounds like syncopal episodes.  Dr. Nicholaus Bloom is aware of these falling out spells.  She saw him last week.  Hasn't tried ice, heat, topical creams.  Hurts to sit up, position changes.   Denies any abdominal pain, pain is in her left ribs. She had x-ray of her R shoulder: Degenerative changes but no acute bony findings.  Narrowing of the humeral acromial space may suggest rotator cuff  disease.  She had been using tylenol, which helped with pain.  She ran out, and was in a lot of pain last night.  She took her daughter's Celebrex, and was able to go to bed, helpful  Review of chart--no labs in computer since 10/2011, borderline kidney function in past.  Past Medical History  Diagnosis Date  . Hypertension   . Hiatal hernia   . Glaucoma(365)   . Depression   . Osteoporosis   . Hyperlipidemia   . Chronic kidney disease     KIDNEY STONES  . NSVT (nonsustained ventricular tachycardia) 10/18/2011  . HTN (hypertension) 10/18/2011  . Hyperlipemia 10/18/2011  . PAD (peripheral artery disease) 10/18/2011   Past Surgical History   Procedure Laterality Date  . Cholecystectomy    . Abdominal hysterectomy     History   Social History  . Marital Status: Widowed    Spouse Name: N/A    Number of Children: N/A  . Years of Education: N/A   Occupational History  . Not on file.   Social History Main Topics  . Smoking status: Never Smoker   . Smokeless tobacco: Never Used  . Alcohol Use: No  . Drug Use: No  . Sexually Active: Not Currently   Other Topics Concern  . Not on file   Social History Narrative  . No narrative on file   Current Outpatient Prescriptions on File Prior to Visit  Medication Sig Dispense Refill  . amLODipine (NORVASC) 10 MG tablet Take 10 mg by mouth daily.        Marland Kitchen aspirin EC 81 MG tablet Take 81 mg by mouth daily.      . cilostazol (PLETAL) 100 MG tablet Take 100 mg by mouth 2 (two) times daily.      . citalopram (CELEXA) 10 MG tablet Take 10 mg by mouth daily.        . clopidogrel (PLAVIX) 75 MG tablet Take 75 mg by mouth daily.        Marland Kitchen ezetimibe (ZETIA) 10 MG tablet  Take 1 tablet (10 mg total) by mouth daily.  30 tablet  3  . losartan (COZAAR) 100 MG tablet Take 100 mg by mouth daily.      Marland Kitchen omega-3 acid ethyl esters (LOVAZA) 1 G capsule Take 1 capsule (1 g total) by mouth 2 (two) times daily.  60 capsule  5  . Pitavastatin Calcium (LIVALO) 2 MG TABS Take 1 tablet by mouth daily.       No current facility-administered medications on file prior to visit.   Allergies  Allergen Reactions  . Crestor (Rosuvastatin Calcium)     Muscle pain  . Lipitor (Atorvastatin Calcium)     Muscle pain   ROS:  Denies fevers, URI symptoms, cough, shortness of breath, nausea, vomiting, bowel changes, bleeding/bruising, skin rash, vertigo, dizziness, chest pain, palpitations, or other concerns except as per HPI.    PHYSICAL EXAM: BP 142/100  Pulse 64  Temp(Src) 97.5 F (36.4 C) (Oral)  Ht 5\' 3"  (1.6 m)  Wt 131 lb (59.421 kg)  BMI 23.21 kg/m2 Elderly female, in no distress.  Moving slowly  due to discomfort with movements. Neck: no lymphadenopathy or mass.  No c-spine tenderness Heart: loud S2, regular rhythm, no murmur Lungs: clear bilaterally Back: no spine or CVA tenderness. She is tender to palpation along inferolateral ribs, and floating ribs.  No bony stepoffs.  No bruising noted. Abdomen: soft, nontender, no mass Extremities: no edema Neuro: alert and oriented.  Normal gait, but slow Skin: no bruising, lesions  ASSESSMENT/PLAN:  1. Chest wall pain   2. Rib contusion    R shoulder pain--f/u with ortho as cheduled  Pain control--use tylenol as needed, heating pad, and try topical meds if needed (ie icy hot or other OTC creams for pain/arthritis). Tylenol was effective prior to running out.  Avoid narcotics given fall risk.  Consider tramadol if significant/worsening pain, not controlled with tylenol.  May need x-rays if pain persists, worsens, any associated shortness of breath, or other respiratory complaints develop.  Pt will call/flu if symptoms persist/worsen.  Advised not to use other persons meds

## 2012-12-25 NOTE — Patient Instructions (Addendum)
You seem to have bruised your ribs.  If there is a break, it will heal on its own--I do not think x-rays will tell us very much. Take acetaminophen (Tylenol--regular or extra strength) as needed for pain control.  You can also try using a heating pad to help with the pain.  If these aren't effective, you can also try topical medications such as icy hot. If pain isn't well controlled with these measures, there are stronger pain medications.  But these can cause sedation, and I'm worried they could increase your risk for falls, and you might get even more injured.  Since tylenol seems to have been effective for your pain (until you ran out), let's plan to restart it.  The pain should gradually improve.  If pain doesn't improve, you should call us--we can order x-rays looking for other abnormalities.  Do not use anybody else's medication.  Stick with tylenol for pain.

## 2012-12-26 ENCOUNTER — Encounter: Payer: Self-pay | Admitting: Family Medicine

## 2012-12-26 DIAGNOSIS — S20219A Contusion of unspecified front wall of thorax, initial encounter: Secondary | ICD-10-CM | POA: Insufficient documentation

## 2012-12-26 DIAGNOSIS — R0789 Other chest pain: Secondary | ICD-10-CM | POA: Insufficient documentation

## 2013-01-22 ENCOUNTER — Encounter (HOSPITAL_COMMUNITY): Payer: Self-pay | Admitting: Pharmacy Technician

## 2013-01-24 ENCOUNTER — Encounter (HOSPITAL_COMMUNITY): Payer: Self-pay

## 2013-01-24 ENCOUNTER — Ambulatory Visit (HOSPITAL_COMMUNITY)
Admission: RE | Admit: 2013-01-24 | Discharge: 2013-01-24 | Disposition: A | Discharge status: Home / Self Care | Payer: Medicare Other | Source: Ambulatory Visit | Attending: Orthopedic Surgery | Admitting: Orthopedic Surgery

## 2013-01-24 ENCOUNTER — Encounter (HOSPITAL_COMMUNITY)
Admission: RE | Admit: 2013-01-24 | Discharge: 2013-01-24 | Disposition: A | Discharge status: Home / Self Care | Payer: Medicare Other | Source: Ambulatory Visit | Attending: Orthopedic Surgery | Admitting: Orthopedic Surgery

## 2013-01-24 DIAGNOSIS — Z01812 Encounter for preprocedural laboratory examination: Secondary | ICD-10-CM | POA: Insufficient documentation

## 2013-01-24 DIAGNOSIS — I10 Essential (primary) hypertension: Secondary | ICD-10-CM | POA: Insufficient documentation

## 2013-01-24 DIAGNOSIS — Z01818 Encounter for other preprocedural examination: Secondary | ICD-10-CM | POA: Insufficient documentation

## 2013-01-24 HISTORY — DX: Unspecified osteoarthritis, unspecified site: M19.90

## 2013-01-24 HISTORY — DX: Unspecified dementia, unspecified severity, without behavioral disturbance, psychotic disturbance, mood disturbance, and anxiety: F03.90

## 2013-01-24 LAB — COMPREHENSIVE METABOLIC PANEL
ALT: 17 U/L (ref 0–35)
AST: 19 U/L (ref 0–37)
Albumin: 3.6 g/dL (ref 3.5–5.2)
Alkaline Phosphatase: 42 U/L (ref 39–117)
BUN: 13 mg/dL (ref 6–23)
CO2: 27 mEq/L (ref 19–32)
Calcium: 10 mg/dL (ref 8.4–10.5)
Chloride: 108 mEq/L (ref 96–112)
Creatinine, Ser: 1.07 mg/dL (ref 0.50–1.10)
GFR calc Af Amer: 54 mL/min — ABNORMAL LOW (ref >90)
GFR calc non Af Amer: 46 mL/min — ABNORMAL LOW (ref >90)
Glucose, Bld: 97 mg/dL (ref 70–99)
Potassium: 4.7 mEq/L (ref 3.5–5.1)
Sodium: 144 mEq/L (ref 135–145)
Total Bilirubin: 0.7 mg/dL (ref 0.3–1.2)
Total Protein: 6.9 g/dL (ref 6.0–8.3)

## 2013-01-24 LAB — SURGICAL PCR SCREEN
MRSA, PCR: NEGATIVE
Staphylococcus aureus: NEGATIVE

## 2013-01-24 LAB — CBC
HCT: 43.8 % (ref 36.0–46.0)
Hemoglobin: 14.8 g/dL (ref 12.0–15.0)
MCH: 31.8 pg (ref 26.0–34.0)
MCHC: 33.8 g/dL (ref 30.0–36.0)
MCV: 94 fL (ref 78.0–100.0)
Platelets: 154 10*3/uL (ref 150–400)
RBC: 4.66 MIL/uL (ref 3.87–5.11)
RDW: 13.2 % (ref 11.5–15.5)
WBC: 6.7 10*3/uL (ref 4.0–10.5)

## 2013-01-24 LAB — PROTIME-INR
INR: 0.93 (ref 0.00–1.49)
Prothrombin Time: 12.4 seconds (ref 11.6–15.2)

## 2013-01-24 LAB — TYPE AND SCREEN
ABO/RH(D): O POS
Antibody Screen: NEGATIVE

## 2013-01-24 LAB — ABO/RH: ABO/RH(D): O POS

## 2013-01-24 NOTE — Pre-Procedure Instructions (Signed)
Julie Bass  01/24/2013   Your procedure is scheduled on: Friday, March 14th   Report to [facility] at 5:30 AM.   Call this number if you have problems the morning of surgery: [phone]   Remember:   Do not eat food or drink liquids after midnight Thursday.   Take these medicines the morning of surgery with A SIP OF WATER: Celexa, Metoprolol, Timoptic              Eye drops   Do not wear jewelry, make-up or nail polish.  Do not wear lotions, powders, or perfumes. You may NOT wear deodorant.   Do not shave underarms & legs 48 hours prior to surgery.   Do not bring valuables to the hospital.   Contacts, dentures or bridgework may not be worn into surgery.  Leave suitcase in the car. After surgery it may be brought to your room.   For patients admitted to the hospital, checkout time is 11:00 AM the day of discharge.   Name and phone number of your driver:    Special Instructions: Shower using CHG 2 nights before surgery and the night before surgery.  If you shower the day of surgery use CHG.  Use special wash - you have one bottle of CHG for all showers.  You should use approximately 1/3 of the bottle for each shower.   Please read over the following fact sheets that you were given: Pain Booklet, Coughing and Deep Breathing, Blood Transfusion Information, MRSA Information and Surgical Site Infection Prevention

## 2013-01-24 NOTE — H&P (Signed)
Julie Bass is an 79 y.o. female.    Chief Complaint: right shoulder pain and weakness  HPI: Pt is a 79 y.o. female complaining of right shoulder pain and weakness for multiple years. Pain had continually increased since the beginning. X-rays in the clinic show end-stage arthritic changes of the right shoulder with arthropathy. Pt has tried various conservative treatments which have failed to alleviate their symptoms, including injections and therapy. Various options are discussed with the patient. Risks, benefits and expectations were discussed with the patient. Patient understand the risks, benefits and expectations and wishes to proceed with surgery.   PCP:  Carollee Herter, MD  D/C Plans:  Home with HHPT  PMH: Past Medical History  Diagnosis Date  . Hypertension   . Hiatal hernia   . Glaucoma(365)   . Depression   . Osteoporosis   . Hyperlipidemia   . Chronic kidney disease     KIDNEY STONES  . NSVT (nonsustained ventricular tachycardia) 10/18/2011  . HTN (hypertension) 10/18/2011  . Hyperlipemia 10/18/2011  . PAD (peripheral artery disease) 10/18/2011    PSH: Past Surgical History  Procedure Laterality Date  . Cholecystectomy    . Abdominal hysterectomy      Social History:  reports that she has never smoked. She has never used smokeless tobacco. She reports that she does not drink alcohol or use illicit drugs.  Allergies:  Allergies  Allergen Reactions  . Crestor (Rosuvastatin Calcium)     Muscle pain  . Lipitor (Atorvastatin Calcium)     Muscle pain    Medications: No current facility-administered medications for this encounter.   Current Outpatient Prescriptions  Medication Sig Dispense Refill  . aspirin EC 81 MG tablet Take 81 mg by mouth daily.      . cilostazol (PLETAL) 100 MG tablet Take 50 mg by mouth 2 (two) times daily.       . citalopram (CELEXA) 10 MG tablet Take 10 mg by mouth daily.        . clopidogrel (PLAVIX) 75 MG tablet Take 75 mg  by mouth daily.        Marland Kitchen ezetimibe (ZETIA) 10 MG tablet Take 1 tablet (10 mg total) by mouth daily.  30 tablet  3  . losartan (COZAAR) 100 MG tablet Take 100 mg by mouth daily.      . metoprolol (LOPRESSOR) 50 MG tablet Take 75-100 mg by mouth 2 (two) times daily. Take 100mg  in the morning and 75mg  in the evening      . omega-3 acid ethyl esters (LOVAZA) 1 G capsule Take 2 g by mouth daily.      . Pitavastatin Calcium (LIVALO) 2 MG TABS Take 1 tablet by mouth daily.      . timolol (TIMOPTIC) 0.5 % ophthalmic solution Place 1 drop into both eyes daily.        No results found for this or any previous visit (from the past 48 hour(s)). No results found.  ROS: Pain and weakness with rom of right upper extremity Otherwise ros negative  Physical Exam: BP:   130/64  ;  HR:   74  ; Resp:   12  : Alert and oriented 79 y.o. female in no acute distress Cranial nerves 2-12 intact Cervical spine: full rom with no tenderness, nv intact distally Chest: active breath sounds bilaterally, no wheeze rhonchi or rales Heart: regular rate and rhythm, no murmur Abd: non tender non distended with active bowel sounds Hip is stable with rom  Moderate pain with rom of right shoulder Strength of ER and IR 4/5 as compared to left  nv intact distally No rashes or edema  Assessment/Plan Assessment: right shoulder rotator cuff arthropathy   Plan: Patient will undergo a reverse total shoulder by Dr. Ranell Patrick at [facility]. Risks benefits and expectations were discussed with the patient. Patient understand risks, benefits and expectations and wishes to proceed.

## 2013-01-24 NOTE — Progress Notes (Signed)
Anesthesia chart review: Patient is 79 year old female scheduled for right reverse total shoulder arthroplasty by Dr. Devonne Doughty on 01/26/2013. History includes nonsmoker, glaucoma, hyperlipidemia, hypertension, nephrolithiasis, dementia, osteoporosis, PAD, NSVT treated with b-blocker therapy, hiatal hernia, depression, arthritis.  Cardiologist is Dr. Tresa Endo [facility]).  He has cleared patient for this procedure with recommendations for post-operative telemetry.  EKG on 12/22/12 [facility]) showed NSR with first degree AVB, poor r wave progression, non-specific ST/T wave abnormality.  Echo on 11/10/12 showed: - Left ventricle: The cavity size was normal. There was moderate concentric hypertrophy. Systolic function was hyperdynamic. The estimated ejection fraction was in the range of 75% to 80%. There was dynamic obstruction in the mid cavity, with a peak velocity of 160cm/sec and a peak gradient of 10mm Hg. Wall motion was normal; there were no regional wall motion abnormalities. Doppler parameters are consistent with abnormal left ventricular relaxation (grade 1 diastolic dysfunction). - Mitral valve: Severely calcified annulus. Mildly thickened leaflets . Valve area by pressure half-time: 2.24cm^2. - Left atrium: The atrium was mildly dilated. - Atrial septum: No defect or patent foramen ovale was identified. - Pulmonary arteries: PA peak pressure: 33mm Hg (S). - Tricuspid valve: Trivial regurgitation.  Nuclear stress test on 10/19/11 showed: 1. No evidence of myocardial ischemia or infarction. Normal myocardial perfusion study.  2. Normal left ventricular wall motion.  3. Estimated Q G S ejection fraction 82%, likely overestimated.  Chest x-ray on 01/24/2013 showed no active cardiopulmonary abnormalities.  Preoperative labs noted.  If no significant change then anticipate she can proceed as planned.  Velna Ochs [facility] Short Stay Center/Anesthesiology Phone [phone] 01/24/2013 5:34  PM

## 2013-01-24 NOTE — Progress Notes (Signed)
SPOKE WITH PATIENT'S DAUGHTER [name] WHO STATED SHE STOPPED ASPIRIN AND PLAVIX Sunday.  CALLED KAREN AT DR. NORRIS'S OFFICE RE: PLETAL, KAREN WILL CHECK WITH DR. Ranell Patrick AND CALL PATIENT IF SHE NEEDS TO STOP, OTHERWISE SHE SHOULD CONTINUE PLETAL.  PATIENT AND GRANDSON ARE AWARE.

## 2013-01-25 MED ORDER — CEFAZOLIN SODIUM-DEXTROSE 2-3 GM-% IV SOLR
2.0000 g | INTRAVENOUS | Status: AC
  Administered 2013-01-26: 2 g via INTRAVENOUS
  Filled 2013-01-25: qty 50

## 2013-01-25 MED ORDER — CHLORHEXIDINE GLUCONATE 4 % EX LIQD: 60.0000 mL | Freq: Once | CUTANEOUS | Status: DC

## 2013-01-26 ENCOUNTER — Inpatient Hospital Stay (HOSPITAL_COMMUNITY): Payer: Medicare Other

## 2013-01-26 ENCOUNTER — Inpatient Hospital Stay (HOSPITAL_COMMUNITY): Payer: Medicare Other | Admitting: Anesthesiology

## 2013-01-26 ENCOUNTER — Encounter (HOSPITAL_COMMUNITY): Payer: Self-pay | Admitting: Surgery

## 2013-01-26 ENCOUNTER — Encounter (HOSPITAL_COMMUNITY): Payer: Self-pay | Admitting: Vascular Surgery

## 2013-01-26 ENCOUNTER — Inpatient Hospital Stay (HOSPITAL_COMMUNITY)
Admission: RE | Admit: 2013-01-26 | Discharge: 2013-01-28 | DRG: 484 | Disposition: A | Discharge status: Home / Self Care | Payer: Medicare Other | Source: Ambulatory Visit | Attending: Orthopedic Surgery | Admitting: Orthopedic Surgery

## 2013-01-26 ENCOUNTER — Encounter (HOSPITAL_COMMUNITY)
Admission: RE | Disposition: A | Discharge status: Home / Self Care | Payer: Self-pay | Source: Ambulatory Visit | Attending: Orthopedic Surgery

## 2013-01-26 DIAGNOSIS — H409 Unspecified glaucoma: Secondary | ICD-10-CM | POA: Diagnosis present

## 2013-01-26 DIAGNOSIS — N189 Chronic kidney disease, unspecified: Secondary | ICD-10-CM | POA: Diagnosis present

## 2013-01-26 DIAGNOSIS — I129 Hypertensive chronic kidney disease with stage 1 through stage 4 chronic kidney disease, or unspecified chronic kidney disease: Secondary | ICD-10-CM | POA: Diagnosis present

## 2013-01-26 DIAGNOSIS — E785 Hyperlipidemia, unspecified: Secondary | ICD-10-CM | POA: Diagnosis present

## 2013-01-26 DIAGNOSIS — Z79899 Other long term (current) drug therapy: Secondary | ICD-10-CM

## 2013-01-26 DIAGNOSIS — M19019 Primary osteoarthritis, unspecified shoulder: Principal | ICD-10-CM | POA: Diagnosis present

## 2013-01-26 DIAGNOSIS — Z7982 Long term (current) use of aspirin: Secondary | ICD-10-CM

## 2013-01-26 HISTORY — PX: REVERSE SHOULDER ARTHROPLASTY: SHX5054

## 2013-01-26 LAB — CBC
HCT: 39.4 % (ref 36.0–46.0)
Hemoglobin: 13.5 g/dL (ref 12.0–15.0)
MCH: 31.1 pg (ref 26.0–34.0)
MCHC: 34.3 g/dL (ref 30.0–36.0)
MCV: 90.8 fL (ref 78.0–100.0)
Platelets: 150 10*3/uL (ref 150–400)
RBC: 4.34 MIL/uL (ref 3.87–5.11)
RDW: 13.1 % (ref 11.5–15.5)
WBC: 8.8 10*3/uL (ref 4.0–10.5)

## 2013-01-26 LAB — CREATININE, SERUM
Creatinine, Ser: 0.94 mg/dL (ref 0.50–1.10)
GFR calc Af Amer: 63 mL/min — ABNORMAL LOW (ref >90)
GFR calc non Af Amer: 54 mL/min — ABNORMAL LOW (ref >90)

## 2013-01-26 SURGERY — ARTHROPLASTY, SHOULDER, TOTAL, REVERSE
Anesthesia: General | Site: Shoulder | Laterality: Right | Wound class: Clean

## 2013-01-26 MED ORDER — ROPIVACAINE HCL 5 MG/ML IJ SOLN
INTRAMUSCULAR | Status: DC | PRN
  Administered 2013-01-26: 30 mL

## 2013-01-26 MED ORDER — CLOPIDOGREL BISULFATE 75 MG PO TABS
75.0000 mg | ORAL_TABLET | Freq: Every day | ORAL | Status: DC
  Filled 2013-01-26 (×2): qty 1

## 2013-01-26 MED ORDER — FENTANYL CITRATE 0.05 MG/ML IJ SOLN
INTRAMUSCULAR | Status: DC | PRN
  Administered 2013-01-26: 50 ug via INTRAVENOUS

## 2013-01-26 MED ORDER — ACETAMINOPHEN 650 MG RE SUPP: 650.0000 mg | Freq: Four times a day (QID) | RECTAL | Status: DC | PRN

## 2013-01-26 MED ORDER — CEFAZOLIN SODIUM-DEXTROSE 2-3 GM-% IV SOLR
2.0000 g | Freq: Four times a day (QID) | INTRAVENOUS | Status: AC
  Administered 2013-01-26 – 2013-01-27 (×3): 2 g via INTRAVENOUS
  Filled 2013-01-26 (×3): qty 50

## 2013-01-26 MED ORDER — ENOXAPARIN SODIUM 30 MG/0.3ML ~~LOC~~ SOLN
30.0000 mg | Freq: Every day | SUBCUTANEOUS | Status: DC
  Filled 2013-01-26: qty 0.3

## 2013-01-26 MED ORDER — ACETAMINOPHEN 325 MG PO TABS: 650.0000 mg | ORAL_TABLET | Freq: Four times a day (QID) | ORAL | Status: DC | PRN

## 2013-01-26 MED ORDER — PHENOL 1.4 % MT LIQD: 1.0000 | OROMUCOSAL | Status: DC | PRN

## 2013-01-26 MED ORDER — SODIUM CHLORIDE 0.9 % IV SOLN
INTRAVENOUS | Status: DC
  Administered 2013-01-26: 15:00:00 via INTRAVENOUS

## 2013-01-26 MED ORDER — HYDROCODONE-ACETAMINOPHEN 5-325 MG PO TABS: 1.0000 | ORAL_TABLET | Freq: Four times a day (QID) | ORAL | Status: DC | PRN

## 2013-01-26 MED ORDER — METOPROLOL TARTRATE 50 MG PO TABS: 75.0000 mg | ORAL_TABLET | Freq: Two times a day (BID) | ORAL | Status: DC

## 2013-01-26 MED ORDER — ONDANSETRON HCL 4 MG PO TABS: 4.0000 mg | ORAL_TABLET | Freq: Four times a day (QID) | ORAL | Status: DC | PRN

## 2013-01-26 MED ORDER — ENOXAPARIN SODIUM 30 MG/0.3ML ~~LOC~~ SOLN
30.0000 mg | SUBCUTANEOUS | Status: DC
  Administered 2013-01-26 – 2013-01-27 (×2): 30 mg via SUBCUTANEOUS
  Filled 2013-01-26 (×2): qty 0.3

## 2013-01-26 MED ORDER — ASPIRIN EC 81 MG PO TBEC
81.0000 mg | DELAYED_RELEASE_TABLET | Freq: Every day | ORAL | Status: DC
  Filled 2013-01-26: qty 1

## 2013-01-26 MED ORDER — EZETIMIBE 10 MG PO TABS
10.0000 mg | ORAL_TABLET | Freq: Every day | ORAL | Status: DC
  Administered 2013-01-26 – 2013-01-28 (×3): 10 mg via ORAL
  Filled 2013-01-26 (×3): qty 1

## 2013-01-26 MED ORDER — ONDANSETRON HCL 4 MG/2ML IJ SOLN: 4.0000 mg | Freq: Four times a day (QID) | INTRAMUSCULAR | Status: DC | PRN

## 2013-01-26 MED ORDER — NEOSTIGMINE METHYLSULFATE 1 MG/ML IJ SOLN
INTRAMUSCULAR | Status: DC | PRN
  Administered 2013-01-26: 2 mg via INTRAVENOUS

## 2013-01-26 MED ORDER — METOCLOPRAMIDE HCL 5 MG/ML IJ SOLN: 5.0000 mg | Freq: Three times a day (TID) | INTRAMUSCULAR | Status: DC | PRN

## 2013-01-26 MED ORDER — ASPIRIN EC 81 MG PO TBEC
81.0000 mg | DELAYED_RELEASE_TABLET | Freq: Every day | ORAL | Status: DC
  Administered 2013-01-27 – 2013-01-28 (×2): 81 mg via ORAL
  Filled 2013-01-26 (×2): qty 1

## 2013-01-26 MED ORDER — CITALOPRAM HYDROBROMIDE 10 MG PO TABS
10.0000 mg | ORAL_TABLET | Freq: Every day | ORAL | Status: DC
  Administered 2013-01-26 – 2013-01-28 (×3): 10 mg via ORAL
  Filled 2013-01-26 (×3): qty 1

## 2013-01-26 MED ORDER — TIMOLOL MALEATE 0.5 % OP SOLN
1.0000 [drp] | Freq: Every day | OPHTHALMIC | Status: DC
  Administered 2013-01-27 – 2013-01-28 (×2): 1 [drp] via OPHTHALMIC
  Filled 2013-01-26: qty 5

## 2013-01-26 MED ORDER — PROPOFOL 10 MG/ML IV BOLUS
INTRAVENOUS | Status: DC | PRN
  Administered 2013-01-26: 100 mg via INTRAVENOUS

## 2013-01-26 MED ORDER — LOSARTAN POTASSIUM 50 MG PO TABS
100.0000 mg | ORAL_TABLET | Freq: Every day | ORAL | Status: DC
  Administered 2013-01-26 – 2013-01-28 (×2): 100 mg via ORAL
  Filled 2013-01-26 (×3): qty 2

## 2013-01-26 MED ORDER — SODIUM CHLORIDE 0.9 % IV SOLN
10.0000 mg | INTRAVENOUS | Status: DC | PRN
  Administered 2013-01-26: 10 ug/min via INTRAVENOUS

## 2013-01-26 MED ORDER — METOPROLOL TARTRATE 100 MG PO TABS
100.0000 mg | ORAL_TABLET | Freq: Once | ORAL | Status: AC
  Administered 2013-01-26: 100 mg via ORAL
  Filled 2013-01-26: qty 1

## 2013-01-26 MED ORDER — HYDROCODONE-ACETAMINOPHEN 5-325 MG PO TABS
1.0000 | ORAL_TABLET | ORAL | Status: DC | PRN
  Administered 2013-01-26 – 2013-01-28 (×3): 2 via ORAL
  Filled 2013-01-26 (×3): qty 2

## 2013-01-26 MED ORDER — MENTHOL 3 MG MT LOZG: 1.0000 | LOZENGE | OROMUCOSAL | Status: DC | PRN

## 2013-01-26 MED ORDER — METOCLOPRAMIDE HCL 10 MG PO TABS: 5.0000 mg | ORAL_TABLET | Freq: Three times a day (TID) | ORAL | Status: DC | PRN

## 2013-01-26 MED ORDER — EPHEDRINE SULFATE 50 MG/ML IJ SOLN
INTRAMUSCULAR | Status: DC | PRN
  Administered 2013-01-26 (×3): 10 mg via INTRAVENOUS

## 2013-01-26 MED ORDER — SODIUM CHLORIDE 0.9 % IR SOLN
Status: DC | PRN
  Administered 2013-01-26: 1000 mL

## 2013-01-26 MED ORDER — MIDAZOLAM HCL 5 MG/5ML IJ SOLN
INTRAMUSCULAR | Status: DC | PRN
  Administered 2013-01-26: 1 mg via INTRAVENOUS

## 2013-01-26 MED ORDER — METOPROLOL TARTRATE 100 MG PO TABS
100.0000 mg | ORAL_TABLET | Freq: Every day | ORAL | Status: DC
  Administered 2013-01-26 – 2013-01-28 (×2): 100 mg via ORAL
  Filled 2013-01-26 (×3): qty 1

## 2013-01-26 MED ORDER — LACTATED RINGERS IV SOLN
INTRAVENOUS | Status: DC | PRN
  Administered 2013-01-26: 07:00:00 via INTRAVENOUS

## 2013-01-26 MED ORDER — ONDANSETRON HCL 4 MG/2ML IJ SOLN
INTRAMUSCULAR | Status: DC | PRN
  Administered 2013-01-26: 4 mg via INTRAVENOUS

## 2013-01-26 MED ORDER — GLYCOPYRROLATE 0.2 MG/ML IJ SOLN
INTRAMUSCULAR | Status: DC | PRN
  Administered 2013-01-26: 0.4 mg via INTRAVENOUS

## 2013-01-26 MED ORDER — LIDOCAINE HCL (CARDIAC) 20 MG/ML IV SOLN
INTRAVENOUS | Status: DC | PRN
  Administered 2013-01-26: 30 mg via INTRAVENOUS

## 2013-01-26 MED ORDER — CLOPIDOGREL BISULFATE 75 MG PO TABS
75.0000 mg | ORAL_TABLET | Freq: Every day | ORAL | Status: DC
  Administered 2013-01-27 – 2013-01-28 (×2): 75 mg via ORAL
  Filled 2013-01-26 (×3): qty 1

## 2013-01-26 MED ORDER — MORPHINE SULFATE 2 MG/ML IJ SOLN
1.0000 mg | INTRAMUSCULAR | Status: DC | PRN
  Administered 2013-01-26 (×2): 1 mg via INTRAVENOUS
  Filled 2013-01-26 (×2): qty 1

## 2013-01-26 MED ORDER — OMEGA-3-ACID ETHYL ESTERS 1 G PO CAPS
2.0000 g | ORAL_CAPSULE | Freq: Every day | ORAL | Status: DC
  Administered 2013-01-26 – 2013-01-28 (×3): 2 g via ORAL
  Filled 2013-01-26 (×3): qty 2

## 2013-01-26 MED ORDER — ROCURONIUM BROMIDE 100 MG/10ML IV SOLN
INTRAVENOUS | Status: DC | PRN
  Administered 2013-01-26: 25 mg via INTRAVENOUS

## 2013-01-26 MED ORDER — CILOSTAZOL 50 MG PO TABS
50.0000 mg | ORAL_TABLET | Freq: Two times a day (BID) | ORAL | Status: DC
  Administered 2013-01-26 – 2013-01-28 (×5): 50 mg via ORAL
  Filled 2013-01-26 (×6): qty 1

## 2013-01-26 MED ORDER — METOPROLOL TARTRATE 50 MG PO TABS
75.0000 mg | ORAL_TABLET | Freq: Every day | ORAL | Status: DC
  Administered 2013-01-26 – 2013-01-27 (×2): 75 mg via ORAL
  Filled 2013-01-26 (×3): qty 1

## 2013-01-26 MED ORDER — BUPIVACAINE-EPINEPHRINE 0.25% -1:200000 IJ SOLN
INTRAMUSCULAR | Status: DC | PRN
  Administered 2013-01-26: 7 mL

## 2013-01-26 MED ORDER — BUPIVACAINE-EPINEPHRINE 0.25% -1:200000 IJ SOLN
INTRAMUSCULAR | Status: AC
  Filled 2013-01-26: qty 1

## 2013-01-26 SURGICAL SUPPLY — 66 items
BENZOIN TINCTURE PRP APPL 2/3 (GAUZE/BANDAGES/DRESSINGS) ×2 IMPLANT
BIT DRILL 170X2.5X (BIT) IMPLANT
BIT DRL 170X2.5X (BIT)
BLADE SAG 18X100X1.27 (BLADE) ×2 IMPLANT
CLOTH BEACON ORANGE TIMEOUT ST (SAFETY) ×2 IMPLANT
CLSR STERI-STRIP ANTIMIC 1/2X4 (GAUZE/BANDAGES/DRESSINGS) ×2 IMPLANT
COVER SURGICAL LIGHT HANDLE (MISCELLANEOUS) ×2 IMPLANT
DRAPE INCISE IOBAN 66X45 STRL (DRAPES) ×4 IMPLANT
DRAPE U-SHAPE 47X51 STRL (DRAPES) ×2 IMPLANT
DRAPE X-RAY CASS 24X20 (DRAPES) IMPLANT
DRILL 2.5 (BIT)
DRSG ADAPTIC 3X8 NADH LF (GAUZE/BANDAGES/DRESSINGS) IMPLANT
DRSG MEPILEX BORDER 4X8 (GAUZE/BANDAGES/DRESSINGS) ×2 IMPLANT
DRSG PAD ABDOMINAL 8X10 ST (GAUZE/BANDAGES/DRESSINGS) IMPLANT
DURAPREP 26ML APPLICATOR (WOUND CARE) ×2 IMPLANT
ELECT BLADE 4.0 EZ CLEAN MEGAD (MISCELLANEOUS) ×2
ELECT NEEDLE TIP 2.8 STRL (NEEDLE) ×2 IMPLANT
ELECT REM PT RETURN 9FT ADLT (ELECTROSURGICAL) ×2
ELECTRODE BLDE 4.0 EZ CLN MEGD (MISCELLANEOUS) ×1 IMPLANT
ELECTRODE REM PT RTRN 9FT ADLT (ELECTROSURGICAL) ×1 IMPLANT
GLOVE BIOGEL PI ORTHO PRO 7.5 (GLOVE) ×1
GLOVE BIOGEL PI ORTHO PRO SZ7 (GLOVE) ×1
GLOVE BIOGEL PI ORTHO PRO SZ8 (GLOVE) ×1
GLOVE ORTHO TXT STRL SZ7.5 (GLOVE) ×2 IMPLANT
GLOVE PI ORTHO PRO STRL 7.5 (GLOVE) ×1 IMPLANT
GLOVE PI ORTHO PRO STRL SZ7 (GLOVE) ×1 IMPLANT
GLOVE PI ORTHO PRO STRL SZ8 (GLOVE) ×1 IMPLANT
GLOVE SURG ORTHO 8.5 STRL (GLOVE) ×2 IMPLANT
GLOVE SURG SS PI 7.0 STRL IVOR (GLOVE) ×2 IMPLANT
GOWN STRL NON-REIN LRG LVL3 (GOWN DISPOSABLE) IMPLANT
GOWN STRL REIN XL XLG (GOWN DISPOSABLE) ×6 IMPLANT
HANDPIECE INTERPULSE COAX TIP (DISPOSABLE)
KIT BASIN OR (CUSTOM PROCEDURE TRAY) ×2 IMPLANT
KIT ROOM TURNOVER OR (KITS) ×2 IMPLANT
MANIFOLD NEPTUNE II (INSTRUMENTS) ×2 IMPLANT
NEEDLE 1/2 CIR MAYO (NEEDLE) ×2 IMPLANT
NEEDLE HYPO 25GX1X1/2 BEV (NEEDLE) ×2 IMPLANT
NS IRRIG 1000ML POUR BTL (IV SOLUTION) ×2 IMPLANT
PACK SHOULDER (CUSTOM PROCEDURE TRAY) ×2 IMPLANT
PAD ARMBOARD 7.5X6 YLW CONV (MISCELLANEOUS) ×4 IMPLANT
PIN GUIDE 1.2 (PIN) IMPLANT
PIN GUIDE GLENOPHERE 1.5MX300M (PIN) IMPLANT
PIN METAGLENE 2.5 (PIN) IMPLANT
SET HNDPC FAN SPRY TIP SCT (DISPOSABLE) IMPLANT
SLING ARM FOAM STRAP LRG (SOFTGOODS) IMPLANT
SLING ARM FOAM STRAP MED (SOFTGOODS) ×2 IMPLANT
SPONGE GAUZE 4X4 12PLY (GAUZE/BANDAGES/DRESSINGS) IMPLANT
SPONGE LAP 18X18 X RAY DECT (DISPOSABLE) ×2 IMPLANT
SPONGE LAP 4X18 X RAY DECT (DISPOSABLE) ×2 IMPLANT
STRIP CLOSURE SKIN 1/2X4 (GAUZE/BANDAGES/DRESSINGS) ×2 IMPLANT
SUCTION FRAZIER TIP 10 FR DISP (SUCTIONS) ×2 IMPLANT
SUT FIBERWIRE #2 38 T-5 BLUE (SUTURE) ×2
SUT MNCRL AB 4-0 PS2 18 (SUTURE) ×2 IMPLANT
SUT VIC AB 2-0 CT1 27 (SUTURE) ×1
SUT VIC AB 2-0 CT1 TAPERPNT 27 (SUTURE) ×1 IMPLANT
SUT VICRYL 0 CT 1 36IN (SUTURE) ×4 IMPLANT
SUTURE FIBERWR #2 38 T-5 BLUE (SUTURE) ×1 IMPLANT
SWABSTICK BENZOIN STERILE (MISCELLANEOUS) ×2 IMPLANT
SYR CONTROL 10ML LL (SYRINGE) ×2 IMPLANT
TAPE CLOTH SURG 4X10 WHT LF (GAUZE/BANDAGES/DRESSINGS) ×2 IMPLANT
TOWEL OR 17X24 6PK STRL BLUE (TOWEL DISPOSABLE) ×2 IMPLANT
TOWEL OR 17X26 10 PK STRL BLUE (TOWEL DISPOSABLE) ×2 IMPLANT
TOWER CARTRIDGE SMART MIX (DISPOSABLE) IMPLANT
TRAY FOLEY CATH 14FR (SET/KITS/TRAYS/PACK) IMPLANT
WATER STERILE IRR 1000ML POUR (IV SOLUTION) ×2 IMPLANT
YANKAUER SUCT BULB TIP NO VENT (SUCTIONS) IMPLANT

## 2013-01-26 NOTE — Progress Notes (Signed)
UR COMPLETED  

## 2013-01-26 NOTE — Anesthesia Procedure Notes (Addendum)
Anesthesia Regional Block:  Supraclavicular block  Pre-Anesthetic Checklist: ,, timeout performed, Correct Patient, Correct Site, Correct Laterality, Correct Procedure, Correct Position, site marked, Risks and benefits discussed,  Surgical consent,  Pre-op evaluation,  At surgeon's request and post-op pain management  Laterality: Right and Upper  Prep: chloraprep       Needles:  Injection technique: Single-shot  Needle Type: Stimiplex          Additional Needles:  Procedures: ultrasound guided (picture in chart) and nerve stimulator Supraclavicular block Narrative:  Injection made incrementally with aspirations every 5 mL.  Performed by: Personally  Anesthesiologist: Phillips Grout MD  Additional Notes: Risks, benefits and alternative to block explained extensively.  Patient tolerated procedure well, without complications.  Supraclavicular block Procedure Name: Intubation Date/Time: 01/26/2013 7:45 AM Performed by: Sharlene Dory E Pre-anesthesia Checklist: Patient identified, Emergency Drugs available, Suction available, Patient being monitored and Timeout performed Patient Re-evaluated:Patient Re-evaluated prior to inductionOxygen Delivery Method: Circle system utilized Preoxygenation: Pre-oxygenation with 100% oxygen Intubation Type: IV induction Ventilation: Mask ventilation without difficulty Laryngoscope Size: Mac and 3 Grade View: Grade II Tube type: Oral Tube size: 7.0 mm Number of attempts: 1 Airway Equipment and Method: Stylet Placement Confirmation: ETT inserted through vocal cords under direct vision,  positive ETCO2 and breath sounds checked- equal and bilateral Secured at: 21 cm Tube secured with: Tape Dental Injury: Teeth and Oropharynx as per pre-operative assessment

## 2013-01-26 NOTE — Brief Op Note (Signed)
01/26/2013  9:15 AM  PATIENT:  Nadara Mode  79 y.o. female  PRE-OPERATIVE DIAGNOSIS:  Right Shoulder Osteoarthritis, rotator cuff insufficiency  POST-OPERATIVE DIAGNOSIS:  Right Shoulder Osteoarthritis, rotator cuff insufficiency  PROCEDURE:  Procedure(s): RIGHT REVERSE TOTAL SHOULDER ARTHROPLASTY (Right), DePuy Delta Reverse  SURGEON:  Surgeon(s) and Role:    * Verlee Rossetti, MD - Primary  PHYSICIAN ASSISTANT:   ASSISTANTS: Thea Gist, PA-C   ANESTHESIA:   regional and general  EBL:  Total I/O In: -  Out: 50 [Blood:50]  BLOOD ADMINISTERED:none  DRAINS: none   LOCAL MEDICATIONS USED:  MARCAINE     SPECIMEN:  No Specimen  DISPOSITION OF SPECIMEN:  N/A  COUNTS:  YES  TOURNIQUET:  * No tourniquets in log *  DICTATION: .Other Dictation: Dictation Number [id number]  PLAN OF CARE: Admit to inpatient   PATIENT DISPOSITION:  PACU - hemodynamically stable.   Delay start of Pharmacological VTE agent (>24hrs) due to surgical blood loss or risk of bleeding: not applicable

## 2013-01-26 NOTE — Preoperative (Signed)
Beta Blockers   Reason not to administer Beta Blockers:Not Applicable 

## 2013-01-26 NOTE — Transfer of Care (Signed)
Immediate Anesthesia Transfer of Care Note  Patient: Julie Bass  Procedure(s) Performed: Procedure(s): RIGHT REVERSE TOTAL SHOULDER ARTHROPLASTY (Right)  Patient Location: PACU  Anesthesia Type:General  Level of Consciousness: awake, alert  and oriented  Airway & Oxygen Therapy: Patient Spontanous Breathing and Patient connected to nasal cannula oxygen  Post-op Assessment: Report given to PACU RN, Post -op Vital signs reviewed and stable and Patient moving all extremities  Post vital signs: Reviewed and stable  Complications: No apparent anesthesia complications

## 2013-01-26 NOTE — Op Note (Signed)
NAMEMarland Kitchen  BAYAN, HEDSTROM NO.:  [id number]  MEDICAL RECORD NO.:  [id number]  LOCATION:  5N01C                        FACILITY:  [facility]  PHYSICIAN:  Almedia Balls. Ranell Patrick, M.D. DATE OF BIRTH:  1926/07/21  DATE OF PROCEDURE:  01/26/2013 DATE OF DISCHARGE:                              OPERATIVE REPORT   PREOPERATIVE DIAGNOSIS:  Right shoulder rotator cuff tear arthropathy.  POSTOPERATIVE DIAGNOSIS:  Right shoulder rotator cuff tear arthropathy.  PROCEDURE PERFORMED:  Right shoulder reverse total shoulder arthroplasty.  ATTENDING SURGEON:  Almedia Balls. Ranell Patrick, M.D.  ASSISTANT:  Donnie Coffin. Dixon, PA-C, who was scrubbed in the entire procedure and necessary for satisfactory completion of the surgery.  ANESTHESIA:  General anesthesia was used plus interscalene block.  ESTIMATED BLOOD LOSS:  50 mL.  FLUID REPLACEMENT:  1000 mL crystalloid.  INSTRUMENT COUNTS:  Correct.  COUNTS:  Correct.  COMPLICATIONS:  There were no complications.  ANTIBIOTICS:  Perioperative antibiotics were given.  INDICATIONS:  The patient is an 79 year old female with worsening right shoulder pain and loss of function secondary to rotator cuff tear arthropathy.  The patient has had progressive pain despite conservative management including injections, modification of activity, pain medications, presents for operative treatment to restore function, eliminate pain to her shoulder, improving quality of life.  Informed consent was obtained.  DESCRIPTION OF PROCEDURE:  After adequate level of anesthesia was achieved, the patient was positioned in the modified beach-chair position.  Right shoulder correctly identified, sterilely prepped and draped in usual manner.  Time-out was called.  We entered the shoulder in standard deltopectoral approach, starting at the coracoid process extending down to the anterior humerus.  Cephalic vein identified, taken laterally to the deltoid, pectoralis taken  medially.  Conjoined tendon identified and retracted medially.  Subscapularis was very diminutive. We released that off the lesser tuberosity and there was very little of that left to release the inferior capsule, progressively externally rotating the humerus.  The rotator cuff was completely absent. We placed her deltoid retractors and then immediately entered the proximal humerus using a drill.  We then reamed up to a size 10 reamer and then placed our 10 head resection guide, it was intramedullary to the appropriate level.  We then set this on 10 degrees of retroversion and resected our head, kept the head for bone graft.  We then went ahead and milled for the metaphyseal component which was a size 1 and this was an epi 1 right, so once we had milled with our reamer for that, we placed our trial which was a 10 body and a epi 1 right metaphysis, impacted that in position.  I retracted the humerus posteriorly, did a 360 degree capsular release and resection of the glenoid labrum.  We then went ahead and removed remaining cartilage on the glenoid, placed our central guide pin, and then prepared for the metaglene, we reamed to bleeding bone.  We then went ahead and drilled over the top of the guide pin for the central PEG.  We then irrigated thoroughly, impacted the metaglene into position.  We then placed inferior 42 screws, superior 36 screw both locked and posterior 18 nonlocked screw.  The anterior screw  was right at the margin of the bony surface and we did not putting that in for a fear of damaging the anterior glenoid with good nice secure fixation of the metaglene, we then placed a 38 standard glenosphere into position, screwed that into placed.  We were careful to protect the axillary nerve both during dissection and also during placement of her glenosphere.  Once that was in place, we went ahead and trial with a +3, 38+ 3 poly.  We were happy with the soft tissue balancing just a  touch of gap on external rotation, thought we could definitely handle a +6 without too much tension for the real. We removed the trial stem from the humerus.  We irrigated thoroughly and then used of elbow bone graft using an impaction bone grafting technique with the hydroxyapatite coated real stem which was 10 days epi 1 right metaphysis.  We impacted that in position getting 10 degrees retroverted, nice secure fit, nice and stable.  We then placed a +6, 38+ 6 poly and reduced the shoulder and were happy with that soft tissue balancing conjoined on tension. Negative sulcus, negative gapping with external rotation.  No impingement whatsoever, axillary nerve free, thoroughly irrigated and closed the deltopectoral interval with 0 Vicryl suture, followed by 2-0 Vicryl subcutaneous closure and 4-0 Monocryl for skin.  Steri-Strips applied, followed by sterile dressing.  The patient tolerated the procedure well.     Almedia Balls. Ranell Patrick, M.D.     SRN/MEDQ  D:  01/26/2013  T:  01/26/2013  Job:  [id number]

## 2013-01-26 NOTE — Anesthesia Postprocedure Evaluation (Signed)
  Anesthesia Post-op Note  Patient: Julie Bass  Procedure(s) Performed: Procedure(s): RIGHT REVERSE TOTAL SHOULDER ARTHROPLASTY (Right)  Patient Location: PACU  Anesthesia Type:GA combined with regional for post-op pain  Level of Consciousness: awake, oriented and patient cooperative  Airway and Oxygen Therapy: Patient Spontanous Breathing  Post-op Pain: mild  Post-op Assessment: Post-op Vital signs reviewed, Patient's Cardiovascular Status Stable, Respiratory Function Stable, Patent Airway, No signs of Nausea or vomiting and Pain level controlled  Post-op Vital Signs: stable  Complications: No apparent anesthesia complications

## 2013-01-26 NOTE — Interval H&P Note (Signed)
History and Physical Interval Note:  01/26/2013 7:19 AM  Julie Bass  has presented today for surgery, with the diagnosis of Right Shoulder Osteoarthritis  The various methods of treatment have been discussed with the patient and family. After consideration of risks, benefits and other options for treatment, the patient has consented to  Procedure(s): RIGHT REVERSE TOTAL SHOULDER ARTHROPLASTY (Right) as a surgical intervention .  The patient's history has been reviewed, patient examined, no change in status, stable for surgery.  I have reviewed the patient's chart and labs.  Questions were answered to the patient's satisfaction.     Fletcher Ostermiller,STEVEN R

## 2013-01-26 NOTE — Anesthesia Preprocedure Evaluation (Addendum)
Anesthesia Evaluation  Patient identified by MRN, date of birth, ID band Patient awake    Reviewed: Allergy & Precautions, H&P , NPO status , Patient's Chart, lab work & pertinent test results  Airway Mallampati: III TM Distance: >3 FB Neck ROM: Full    Dental no notable dental hx. (+) Teeth Intact   Pulmonary neg pulmonary ROS,  breath sounds clear to auscultation  Pulmonary exam normal       Cardiovascular hypertension, Pt. on medications + Peripheral Vascular Disease + dysrhythmias Ventricular Tachycardia Rhythm:Regular Rate:Normal     Neuro/Psych Dementia early stage negative neurological ROS  negative psych ROS   GI/Hepatic negative GI ROS, Neg liver ROS, hiatal hernia,   Endo/Other  negative endocrine ROS  Renal/GU negative Renal ROS  negative genitourinary   Musculoskeletal negative musculoskeletal ROS (+)   Abdominal   Peds negative pediatric ROS (+)  Hematology negative hematology ROS (+)   Anesthesia Other Findings   Reproductive/Obstetrics negative OB ROS                         Anesthesia Physical Anesthesia Plan  ASA: II  Anesthesia Plan: General   Post-op Pain Management:    Induction: Intravenous  Airway Management Planned: Oral ETT  Additional Equipment:   Intra-op Plan:   Post-operative Plan: Extubation in OR  Informed Consent: I have reviewed the patients History and Physical, chart, labs and discussed the procedure including the risks, benefits and alternatives for the proposed anesthesia with the patient or authorized representative who has indicated his/her understanding and acceptance.   Dental advisory given  Plan Discussed with: CRNA  Anesthesia Plan Comments: (SCB plus GA)        Anesthesia Quick Evaluation

## 2013-01-27 LAB — HEMOGLOBIN AND HEMATOCRIT, BLOOD
HCT: 34 % — ABNORMAL LOW (ref 36.0–46.0)
Hemoglobin: 11.5 g/dL — ABNORMAL LOW (ref 12.0–15.0)

## 2013-01-27 LAB — BASIC METABOLIC PANEL
BUN: 9 mg/dL (ref 6–23)
CO2: 25 mEq/L (ref 19–32)
Calcium: 8.8 mg/dL (ref 8.4–10.5)
Chloride: 105 mEq/L (ref 96–112)
Creatinine, Ser: 0.95 mg/dL (ref 0.50–1.10)
GFR calc Af Amer: 62 mL/min — ABNORMAL LOW (ref >90)
GFR calc non Af Amer: 53 mL/min — ABNORMAL LOW (ref >90)
Glucose, Bld: 136 mg/dL — ABNORMAL HIGH (ref 70–99)
Potassium: 3.6 mEq/L (ref 3.5–5.1)
Sodium: 136 mEq/L (ref 135–145)

## 2013-01-27 NOTE — Progress Notes (Signed)
Occupational Therapy Evaluation Patient Details Name: Julie Bass MRN: [id number] DOB: 09/17/1926 Today's Date: 01/27/2013 Time: 4098-1191 OT Time Calculation (min): 28 min  OT Assessment / Plan / Recommendation Clinical Impression  79 yo s/p Reverse TSA. Began education regarding reverse TSA protocal. Given handouts to review. Pt able to complete AAROM elbow indpendently. Begining to use R hand for functional tasks with little c/o pain. Pt positioned in chair with ice on shoulder. Will return this pm to educate daughter on protocal. will follow acutely. Do not anticipate HH needs.    OT Assessment  Patient needs continued OT Services    Follow Up Recommendations  No OT follow up    Barriers to Discharge None    Equipment Recommendations  Tub/shower seat    Recommendations for Other Services    Frequency  Min 2X/week    Precautions / Restrictions Precautions Precautions: Shoulder;Other (comment) (reverse) Type of Shoulder Precautions: sling for comfort. ROM within pain tolerance Restrictions Weight Bearing Restrictions: Yes RUE Weight Bearing: Non weight bearing   Pertinent Vitals/Pain no apparent distress     ADL  Eating/Feeding: Modified independent Grooming: Moderate assistance Where Assessed - Grooming: Unsupported sitting Upper Body Bathing: Moderate assistance Where Assessed - Upper Body Bathing: Unsupported sitting Lower Body Bathing: Moderate assistance Where Assessed - Lower Body Bathing: Supported sit to stand Upper Body Dressing: Moderate assistance Where Assessed - Upper Body Dressing: Supported sitting Lower Body Dressing: Moderate assistance Where Assessed - Lower Body Dressing: Supported sit to Pharmacist, hospital: Insurance underwriter and Hygiene: Moderate assistance Where Assessed - Toileting Clothing Manipulation and Hygiene: Standing Transfers/Ambulation Related to ADLs: minguard ADL Comments: decreased knowledge of  comensatory strategies. Began education on compensatory strategies for bathing and dressing    OT Diagnosis: Generalized weakness;Acute pain  OT Problem List: Decreased strength;Decreased range of motion;Decreased knowledge of precautions;Impaired UE functional use;Pain OT Treatment Interventions: Self-care/ADL training;Therapeutic exercise;Therapeutic activities;Patient/family education   OT Goals Acute Rehab OT Goals OT Goal Formulation: With patient Time For Goal Achievement: 02/03/13 Potential to Achieve Goals: Good ADL Goals Pt Will Perform Upper Body Bathing: with supervision;with caregiver independent in assisting ADL Goal: Upper Body Bathing - Progress: Goal set today Pt Will Perform Upper Body Dressing: with supervision;with caregiver independent in assisting ADL Goal: Upper Body Dressing - Progress: Goal set today Additional ADL Goal #1: pt/family indpendent with donning/doffing sling and wearing schedule. ADL Goal: Additional Goal #1 - Progress: Goal set today Arm Goals Pt Will Perform AROM: Independently;1 set;Right upper extremity;Other (comment) (shoulder FF  to 90. ER to tolerance. no abduction) Arm Goal: AROM - Progress: Goal set today Miscellaneous OT Goals Miscellaneous OT Goal #1: Pt/family indpendent with positioning RUE at rest and sleeping, in addition to edema control OT Goal: Miscellaneous Goal #1 - Progress: Goal set today  Visit Information  Last OT Received On: 01/27/13    Subjective Data      Prior Functioning     Home Living Lives With: Alone Available Help at Discharge: Available PRN/intermittently (grandson lives with her and is there at night and leaves in ) Type of Home: House Home Access: Stairs to enter Secretary/administrator of Steps: 4 Entrance Stairs-Rails: Can reach both Home Layout: One level Bathroom Shower/Tub: Engineer, manufacturing systems: Standard Home Adaptive Equipment: None Prior Function Level of Independence:  Independent Able to Take Stairs?: Yes Driving: Yes Dominant Hand: Right         Vision/Perception Vision - History Baseline Vision: Wears glasses all  the time   Cognition  Cognition Overall Cognitive Status: Appears within functional limits for tasks assessed/performed Arousal/Alertness: Awake/alert Orientation Level: Appears intact for tasks assessed Behavior During Session: [facility] for tasks performed    Extremity/Trunk Assessment Right Upper Extremity Assessment RUE ROM/Strength/Tone: Deficits;Due to precautions;Due to pain RUE ROM/Strength/Tone Deficits: limitations with shoulder ROM. elbow.wrist. hand WFL RUE Sensation: WFL - Light Touch;WFL - Proprioception RUE Coordination: Deficits (due to surgery) Left Upper Extremity Assessment LUE ROM/Strength/Tone: WFL for tasks assessed Right Lower Extremity Assessment RLE ROM/Strength/Tone: [facility] for tasks assessed Left Lower Extremity Assessment LLE ROM/Strength/Tone: WFL for tasks assessed Trunk Assessment Trunk Assessment: Normal     Mobility Bed Mobility Bed Mobility: Supine to Sit Supine to Sit: 4: Min guard Transfers Transfers: Sit to Stand;Stand to Sit Sit to Stand: 4: Min guard Stand to Sit: 4: Min guard     Exercise Shoulder Exercises Elbow Flexion: Right;10 reps;Seated;AAROM Elbow Extension: AAROM;Right;10 reps;Seated Wrist Flexion: AROM;Right;5 reps Wrist Extension: AROM;Right;5 reps Digit Composite Flexion: AROM;Right;5 reps Composite Extension: AROM;Right;5 reps Donning/doffing shirt without moving shoulder: Maximal assistance Method for sponge bathing under operated UE: Moderate assistance Donning/doffing sling/immobilizer: Maximal assistance Correct positioning of sling/immobilizer: Maximal assistance Pendulum exercises (written home exercise program): Maximal assistance ROM for elbow, wrist and digits of operated UE: Supervision/safety Sling wearing schedule (on at all times/off for ADL's): Minimal  assistance Proper positioning of operated UE when showering: Minimal assistance Dressing change: Minimal assistance Positioning of UE while sleeping: Minimal assistance   Balance Balance Balance Assessed:  (WFL for ADL)   End of Session OT - End of Session Activity Tolerance: Patient tolerated treatment well Patient left: in chair;with call bell/phone within reach Nurse Communication: Mobility status;Precautions  GO     [facility] 01/27/2013, 9:32 AM [facility], OTR/L  [phone] 01/27/2013

## 2013-01-27 NOTE — Discharge Summary (Signed)
Physician Discharge Summary   Patient ID: Julie Bass MRN: [id number] DOB/AGE: 77-Feb-1927 77 y.o.  Admit date: 01/26/2013 Discharge date: 01/27/2013  Admission Diagnoses:  Principal Problem:   Osteoarthrosis, unspecified whether generalized or localized, shoulder region   Discharge Diagnoses:  Same   Surgeries: Procedure(s): RIGHT REVERSE TOTAL SHOULDER ARTHROPLASTY on 01/26/2013   Consultants: PT/OT  Discharged Condition: Stable  Hospital Course: Julie Bass is an 79 y.o. female who was admitted 01/26/2013 with a chief complaint of No chief complaint on file. , and found to have a diagnosis of Osteoarthrosis, unspecified whether generalized or localized, shoulder region.  They were brought to the operating room on 01/26/2013 and underwent the above named procedures.    The patient had an uncomplicated hospital course and was stable for discharge.  Recent vital signs:  Filed Vitals:   01/27/13 0627  BP: 109/43  Pulse: 76  Temp: 99.2 F (37.3 C)  Resp: 18    Recent laboratory studies:  Results for orders placed during the hospital encounter of 01/26/13  CBC      Result Value Range   WBC 8.8  4.0 - 10.5 K/uL   RBC 4.34  3.87 - 5.11 MIL/uL   Hemoglobin 13.5  12.0 - 15.0 g/dL   HCT 40.9  81.1 - 91.4 %   MCV 90.8  78.0 - 100.0 fL   MCH 31.1  26.0 - 34.0 pg   MCHC 34.3  30.0 - 36.0 g/dL   RDW 78.2  95.6 - 21.3 %   Platelets 150  150 - 400 K/uL  CREATININE, SERUM      Result Value Range   Creatinine, Ser 0.94  0.50 - 1.10 mg/dL   GFR calc non Af Amer 54 (*) >90 mL/min   GFR calc Af Amer 63 (*) >90 mL/min  HEMOGLOBIN AND HEMATOCRIT, BLOOD      Result Value Range   Hemoglobin 11.5 (*) 12.0 - 15.0 g/dL   HCT 08.6 (*) 57.8 - 46.9 %    Discharge Medications:     Medication List    STOP taking these medications       ezetimibe 10 MG tablet  Commonly known as:  ZETIA      TAKE these medications       aspirin EC 81 MG tablet  Take 81 mg by mouth  daily.     cilostazol 100 MG tablet  Commonly known as:  PLETAL  Take 50 mg by mouth 2 (two) times daily.     citalopram 10 MG tablet  Commonly known as:  CELEXA  Take 10 mg by mouth daily.     clopidogrel 75 MG tablet  Commonly known as:  PLAVIX  Take 75 mg by mouth daily.     HYDROcodone-acetaminophen 5-325 MG per tablet  Commonly known as:  NORCO  Take 1 tablet by mouth every 6 (six) hours as needed for pain.     LIVALO 2 MG Tabs  Generic drug:  Pitavastatin Calcium  Take 1 tablet by mouth daily.     losartan 100 MG tablet  Commonly known as:  COZAAR  Take 100 mg by mouth daily.     metoprolol 50 MG tablet  Commonly known as:  LOPRESSOR  Take 75-100 mg by mouth 2 (two) times daily. Take 100mg  in the morning and 75mg  in the evening     omega-3 acid ethyl esters 1 G capsule  Commonly known as:  LOVAZA  Take 2 g by mouth daily.  timolol 0.5 % ophthalmic solution  Commonly known as:  TIMOPTIC  Place 1 drop into both eyes daily.        Diagnostic Studies: Dg Chest 2 View  01/24/2013  *RADIOLOGY REPORT*  Clinical Data: Hypertension  CHEST - 2 VIEW  Comparison: 10/18/2011  Findings: Heart size is normal.  No pleural effusion or edema identified.  No airspace consolidation identified.  The visualized osseous structures appear intact.  IMPRESSION:  1.  No active cardiopulmonary abnormalities.   Original Report Authenticated By: Signa Kell, M.D.    Dg Shoulder Right Port  01/26/2013  *RADIOLOGY REPORT*  Clinical Data: Status post shoulder replacement.  PORTABLE RIGHT SHOULDER - 2+ VIEW  Comparison: Plain films right shoulder 12/22/2012.  Findings: The patient has a new reverse shoulder arthroplasty.  The device is located.  No fracture is identified.  Gas in the soft tissues from surgery is noted.  There is some acromioclavicular degenerative change.  Imaged right lung and ribs are clear.  IMPRESSION: Reverse right shoulder arthroplasty without evidence of complication.    Original Report Authenticated By: Holley Dexter, M.D.     Disposition: 01-Home or Self Care        Follow-up Information   Follow up with NORRIS,STEVEN R, MD. Call in 2 weeks. [phone])    Contact information:   [address], [address] [place] [address] [phone]        Signed: Thea Gist 01/27/2013, 7:19 AM

## 2013-01-27 NOTE — Progress Notes (Signed)
Held am dose of Cozaar and metoprolol per verbal md order. Verbal order to also monitor pt bp throughout the continuation of the day and re-evaluate before pm dose of any bp meds. MD Hewitt/Kylee Nardozzi Tammi Sou. SN [facility].

## 2013-01-27 NOTE — Progress Notes (Signed)
Subjective: 1 Day Post-Op Procedure(s) (LRB): RIGHT REVERSE TOTAL SHOULDER ARTHROPLASTY (Right) Patient reports pain as moderate.  Still requiring routine IV pain meds.  Denies numbness, tingling or weakness in R UE.  Objective: Vital signs in last 24 hours: Temp:  [97.7 F (36.5 C)-99.2 F (37.3 C)] 99.2 F (37.3 C) (03/15 0627) Pulse Rate:  [58-76] 76 (03/15 0627) Resp:  [10-21] 18 (03/15 0627) BP: (109-178)/(43-79) 109/43 mmHg (03/15 0627) SpO2:  [94 %-98 %] 98 % (03/15 0627)  Intake/Output from previous day: 03/14 0701 - 03/15 0700 In: 920 [P.O.:120; I.V.:800] Out: 50 [Blood:50] Intake/Output this shift:     Recent Labs  01/24/13 1547 01/26/13 1301 01/27/13 0618  HGB 14.8 13.5 11.5*    Recent Labs  01/24/13 1547 01/26/13 1301 01/27/13 0618  WBC 6.7 8.8  --   RBC 4.66 4.34  --   HCT 43.8 39.4 34.0*  PLT 154 150  --     Recent Labs  01/24/13 1547 01/26/13 1301 01/27/13 0618  NA 144  --  136  K 4.7  --  3.6  CL 108  --  105  CO2 27  --  25  BUN 13  --  9  CREATININE 1.07 0.94 0.95  GLUCOSE 97  --  136*  CALCIUM 10.0  --  8.8    Recent Labs  01/24/13 1547  INR 0.93    PE:  R UE dressed and dry.  2+ radial pulse.  motor / sensory fxn intact in radial, median and ulnar nerve dist.  Assessment/Plan: 1 Day Post-Op Procedure(s) (LRB): RIGHT REVERSE TOTAL SHOULDER ARTHROPLASTY (Right) For pain control we'll keep her another day.  She can be up OOB walking and to the chair as tolerated.   Toni Arthurs 01/27/2013, 8:48 AM

## 2013-01-27 NOTE — Progress Notes (Signed)
Occupational Therapy Treatment Patient Details Name: Julie Bass MRN: [id number] DOB: 04/22/1926 Today's Date: 01/27/2013 Time: 5784-6962 OT Time Calculation (min): 30 min  OT Assessment / Plan / Recommendation Comments on Treatment Session Making excellent progress. Completed second session this pm to review HEP/protocal with pt/daughter. Written handout given. Instructed pt to use RUE within pain tolerance with the exception of not using the arm for transfers. Discussed need for daughter to have S for her mother for at least 1 week. Pt to follow up with Dr. Ranell Patrick and future therapy needs can be determined at that time. Will see in am prior to d/C.    Follow Up Recommendations  No OT follow up    Barriers to Discharge   none    Equipment Recommendations  Tub/shower seat    Recommendations for Other Services  none  Frequency Min 2X/week   Plan Discharge plan remains appropriate    Precautions / Restrictions Precautions Precautions: Shoulder;Other (comment) Type of Shoulder Precautions: sling for comfort. ROM within pain tolerance Restrictions RUE Weight Bearing: Non weight bearing   Pertinent Vitals/Pain no apparent distress     ADL  Toilet Transfer: Supervision/safety Toilet Transfer Method: Other (comment) (ambulating) Toilet Transfer Equipment: Comfort height toilet Toileting - Clothing Manipulation and Hygiene: Supervision/safety Where Assessed - Toileting Clothing Manipulation and Hygiene: Sit to stand from 3-in-1 or toilet Equipment Used: Gait belt Transfers/Ambulation Related to ADLs: minguard ADL Comments: Educated pt's daughter on reverse shoulder protocal. daughter verbalized understanding.     OT Diagnosis:    OT Problem List:   OT Treatment Interventions:     OT Goals Acute Rehab OT Goals OT Goal Formulation: With patient Time For Goal Achievement: 02/03/13 Potential to Achieve Goals: Good ADL Goals Pt Will Perform Upper Body Bathing: with  supervision;with caregiver independent in assisting ADL Goal: Upper Body Bathing - Progress: Progressing toward goals Pt Will Perform Upper Body Dressing: with supervision;with caregiver independent in assisting ADL Goal: Upper Body Dressing - Progress: Progressing toward goals Additional ADL Goal #1: pt/family indpendent with donning/doffing sling and wearing schedule. ADL Goal: Additional Goal #1 - Progress: Progressing toward goals Arm Goals Pt Will Perform AROM: Independently;1 set;Right upper extremity;Other (comment) Arm Goal: AROM - Progress: Progressing toward goal Miscellaneous OT Goals Miscellaneous OT Goal #1: Pt/family indpendent with positioning RUE at rest and sleeping, in addition to edema control OT Goal: Miscellaneous Goal #1 - Progress: Met  Visit Information  Last OT Received On: 01/27/13    Subjective Data      Prior Functioning       Cognition       Mobility  Bed Mobility Bed Mobility: Supine to Sit Supine to Sit: 5: Supervision Transfers Transfers: Sit to Stand;Stand to Sit Sit to Stand: 4: Min guard Stand to Sit: 4: Min guard    Exercises  Shoulder Exercises Pendulum Exercise: Right;10 reps;Standing;AAROM Shoulder Flexion: AAROM;Right;5 reps;Seated (to 20 FF) Shoulder External Rotation: Right;5 reps;Seated Elbow Flexion: AROM;Right;5 reps Elbow Extension: AROM;Right;5 reps Wrist Flexion: AROM;Right;5 reps Donning/doffing shirt without moving shoulder: Moderate assistance Method for sponge bathing under operated UE: Supervision/safety Donning/doffing sling/immobilizer: Modified independent Correct positioning of sling/immobilizer: Modified independent Pendulum exercises (written home exercise program): Supervision/safety ROM for elbow, wrist and digits of operated UE: Supervision/safety Sling wearing schedule (on at all times/off for ADL's): Supervision/safety Proper positioning of operated UE when showering: Supervision/safety Positioning of UE  while sleeping: Supervision/safety   Balance     End of Session OT - End of Session Equipment Utilized  During Treatment: Gait belt Activity Tolerance: Patient tolerated treatment well Patient left: in chair;with call bell/phone within reach Nurse Communication: Mobility status;Precautions  GO     Lori-Ann Lindfors,HILLARY 01/27/2013, 6:42 PM [facility], OTR/L  [phone] 01/27/2013

## 2013-01-28 NOTE — Progress Notes (Signed)
[facility] is following this patient, please notify Elta Guadeloupe, RN MSN with [facility] at [phone] if patient discharges home today with HHOT or HHPT services.

## 2013-01-28 NOTE — Progress Notes (Signed)
Occupational Therapy Treatment Patient Details Name: Julie Bass MRN: [id number] DOB: 1925-11-26 Today's Date: 01/28/2013 Time: 9562-1308 OT Time Calculation (min): 26 min  OT Assessment / Plan / Recommendation Comments on Treatment Session Completed education with pt's daughter. Pt ready for D/C.    Follow Up Recommendations  Other (comment)    Barriers to Discharge   none    Equipment Recommendations  Tub/shower seat    Recommendations for Other Services    Frequency Min 2X/week   Plan Discharge plan remains appropriate    Precautions / Restrictions Precautions Precautions: Shoulder;Other (comment) Type of Shoulder Precautions: sling for comfort. ROM within pain tolerance Restrictions RUE Weight Bearing: Non weight bearing   Pertinent Vitals/Pain No c/o     ADL  Grooming: Wash/dry hands;Teeth care;Supervision/safety;Set up Upper Body Bathing: Supervision/safety;Set up Where Assessed - Upper Body Bathing: Unsupported sit to stand Equipment Used: Gait belt Transfers/Ambulation Related to ADLs: S ADL Comments: Pt completed bathing/dressing with s for bathing and min A for UB dressing. Reviewed precautions and educated pt to keep arm in front of her during ADL and to maintain NWB through RUE during transfers. Mepilex placed over wound per Dr. Shon Baton instructions. Reviewed HEP with daughter.    OT Diagnosis:    OT Problem List:   OT Treatment Interventions:     OT Goals Acute Rehab OT Goals OT Goal Formulation: With patient Time For Goal Achievement: 02/03/13 Potential to Achieve Goals: Good ADL Goals Pt Will Perform Upper Body Bathing: with supervision;with caregiver independent in assisting ADL Goal: Upper Body Bathing - Progress: Met Pt Will Perform Upper Body Dressing: with supervision;with caregiver independent in assisting ADL Goal: Upper Body Dressing - Progress: Met Additional ADL Goal #1: pt/family indpendent with donning/doffing sling and wearing  schedule. ADL Goal: Additional Goal #1 - Progress: Met Arm Goals Pt Will Perform AROM: Independently;1 set;Right upper extremity;Other (comment) Arm Goal: AROM - Progress: Met Miscellaneous OT Goals Miscellaneous OT Goal #1: Pt/family indpendent with positioning RUE at rest and sleeping, in addition to edema control OT Goal: Miscellaneous Goal #1 - Progress: Met  Visit Information  Last OT Received On: 01/28/13 Assistance Needed: +1    Subjective Data      Prior Functioning       Cognition  Cognition Overall Cognitive Status:  (impaired STM) Behavior During Session: [facility] for tasks performed    Mobility  Transfers Transfers: Sit to Stand;Stand to Sit Sit to Stand: 5: Supervision Stand to Sit: 5: Supervision    Exercises  Shoulder Exercises Pendulum Exercise: AAROM;Right;Standing Shoulder Flexion: AAROM;Right;5 reps;Seated Shoulder External Rotation: Right;5 reps;Seated Elbow Flexion: AROM;Right;5 reps Elbow Extension: AROM;Right;5 reps Wrist Flexion: AROM;Right;5 reps Wrist Extension: AROM;Right;5 reps Digit Composite Flexion: AROM;Right;5 reps Composite Extension: AROM;Right;5 reps Donning/doffing shirt without moving shoulder: Supervision/safety Method for sponge bathing under operated UE: Supervision/safety Donning/doffing sling/immobilizer: Supervision/safety Correct positioning of sling/immobilizer: Supervision/safety Pendulum exercises (written home exercise program): Supervision/safety ROM for elbow, wrist and digits of operated UE: Modified independent Sling wearing schedule (on at all times/off for ADL's): Modified independent Proper positioning of operated UE when showering: Modified independent Dressing change: Supervision/safety Positioning of UE while sleeping: Modified independent   Balance  S   End of Session OT - End of Session Equipment Utilized During Treatment: Gait belt Activity Tolerance: Patient tolerated treatment well Patient left: in  chair;with call bell/phone within reach;with family/visitor present Nurse Communication: Other (comment) (ready for D/C)  GO     Vada Swift,HILLARY 01/28/2013, 11:37 AM Luisa Dago, OTR/L  [phone]  01/28/2013 

## 2013-01-28 NOTE — Progress Notes (Signed)
    Subjective: 2 Days Post-Op Procedure(s) (LRB): RIGHT REVERSE TOTAL SHOULDER ARTHROPLASTY (Right) Patient reports pain as 3 on 0-10 scale.   Denies CP or SOB.  Voiding without difficulty. Positive flatus. Objective: Vital signs in last 24 hours: Temp:  [97.9 F (36.6 C)-99.5 F (37.5 C)] 99.5 F (37.5 C) (03/16 0542) Pulse Rate:  [66-96] 78 (03/16 0542) Resp:  [15-18] 15 (03/16 0542) BP: (120-156)/(44-60) 128/60 mmHg (03/16 0544) SpO2:  [94 %-98 %] 94 % (03/16 0542)  Intake/Output from previous day: 03/15 0701 - 03/16 0700 In: 360 [P.O.:360] Out: -  Intake/Output this shift: Total I/O In: 120 [P.O.:120] Out: -   Labs:  Recent Labs  01/26/13 1301 01/27/13 0618  HGB 13.5 11.5*    Recent Labs  01/26/13 1301 01/27/13 0618  WBC 8.8  --   RBC 4.34  --   HCT 39.4 34.0*  PLT 150  --     Recent Labs  01/26/13 1301 01/27/13 0618  NA  --  136  K  --  3.6  CL  --  105  CO2  --  25  BUN  --  9  CREATININE 0.94 0.95  GLUCOSE  --  136*  CALCIUM  --  8.8   No results found for this basename: LABPT, INR,  in the last 72 hours  Physical Exam: Neurologically intact ABD soft Incision: dressing C/D/I and no drainage Compartment soft  Assessment/Plan: 2 Days Post-Op Procedure(s) (LRB): RIGHT REVERSE TOTAL SHOULDER ARTHROPLASTY (Right) Advance diet Up with therapy D/c to home today  Abshir Paolini D for Dr. Venita Lick [facility] [phone] 01/28/2013, 11:14 AM

## 2013-01-28 NOTE — Progress Notes (Signed)
Occupational Therapy Treatment Patient Details Name: Julie Bass MRN: [id number] DOB: August 13, 1926 Today's Date: 01/28/2013 Time: 3086-5784 OT Time Calculation (min): 31 min  OT Assessment / Plan / Recommendation Comments on Treatment Session Continues to make good progress. Using R arm to brush teeth. Ambulated @ 200 ft @ unit with S.Unable to recall precautions of not using RUE to push up during transfers. Most likely baseline memory impairment. Pt will need 24/7 S at D/C for 1-2 weeks, then intermittent S. Discussed this concern yesterday with daughter. Will attempt to see again with daughter prior to D/C today to review exercises and precautions. Feel pt appriopriatte for D/C today. Further OT needs to be addressed at follow up visit with Dr. Ranell Patrick.    Follow Up Recommendations  Other (comment) (TBA at follow up visit with Dr. Ranell Patrick)    Barriers to Discharge       Equipment Recommendations  Tub/shower seat    Recommendations for Other Services    Frequency Min 2X/week   Plan Discharge plan remains appropriate    Precautions / Restrictions Precautions Precautions: Shoulder;Other (comment) Type of Shoulder Precautions: sling for comfort. ROM within pain tolerance Restrictions RUE Weight Bearing: Non weight bearing   Pertinent Vitals/Pain Does not rate. C/o shoulder pain. Requested pain meds from nsg. Ice applied    ADL  Grooming: Wash/dry hands;Teeth care;Supervision/safety;Set up Upper Body Bathing: Supervision/safety;Set up Where Assessed - Upper Body Bathing: Unsupported sit to stand Equipment Used: Gait belt Transfers/Ambulation Related to ADLs: S ADL Comments: Pt unable to recall precautions from yesterday. Educated pt again on protocal (Abulated @ 256ft with S)    OT Diagnosis:    OT Problem List:   OT Treatment Interventions:     OT Goals Acute Rehab OT Goals OT Goal Formulation: With patient Time For Goal Achievement: 02/03/13 Potential to Achieve Goals:  Good ADL Goals Pt Will Perform Upper Body Bathing: with supervision;with caregiver independent in assisting ADL Goal: Upper Body Bathing - Progress: Progressing toward goals Pt Will Perform Upper Body Dressing: with supervision;with caregiver independent in assisting ADL Goal: Upper Body Dressing - Progress: Progressing toward goals Additional ADL Goal #1: pt/family indpendent with donning/doffing sling and wearing schedule. ADL Goal: Additional Goal #1 - Progress: Met Arm Goals Pt Will Perform AROM: Independently;1 set;Right upper extremity;Other (comment) Arm Goal: AROM - Progress: Progressing toward goal Miscellaneous OT Goals Miscellaneous OT Goal #1: Pt/family indpendent with positioning RUE at rest and sleeping, in addition to edema control OT Goal: Miscellaneous Goal #1 - Progress: Met  Visit Information  Last OT Received On: 01/28/13    Subjective Data      Prior Functioning       Cognition  Cognition Overall Cognitive Status:  (impaired STM) Behavior During Session: [facility] for tasks performed    Mobility  Transfers Transfers: Sit to Stand;Stand to Sit Sit to Stand: 5: Supervision Stand to Sit: 5: Supervision    Exercises  Shoulder Exercises Pendulum Exercise: AAROM;Right;Standing Shoulder Flexion: AAROM;Right;5 reps;Seated Shoulder External Rotation: Right;5 reps;Seated Elbow Flexion: AROM;Right;5 reps Elbow Extension: AROM;Right;5 reps Wrist Flexion: AROM;Right;5 reps Wrist Extension: AROM;Right;5 reps Digit Composite Flexion: AROM;Right;5 reps Composite Extension: AROM;Right;5 reps Pendulum exercises (written home exercise program): Supervision/safety ROM for elbow, wrist and digits of operated UE: Supervision/safety Sling wearing schedule (on at all times/off for ADL's): Supervision/safety Proper positioning of operated UE when showering: Supervision/safety Positioning of UE while sleeping: Modified independent   Balance  S   End of Session OT - End of  Session  Equipment Utilized During Treatment: Gait belt Activity Tolerance: Patient tolerated treatment well Patient left: in chair;with call bell/phone within reach Nurse Communication: Mobility status;Precautions  GO     Juneau Doughman,HILLARY 01/28/2013, 10:26 AM [facility], OTR/L  [phone] 01/28/2013

## 2013-01-29 ENCOUNTER — Encounter (HOSPITAL_COMMUNITY): Payer: Self-pay | Admitting: Orthopedic Surgery

## 2013-01-30 NOTE — Progress Notes (Signed)
Wasted of Fentanyl in sink with RN Wiliam Ke. Patient received from PACU with 5ml Syringe and 4 ml present blue label attached to syringe initials EW.

## 2013-04-30 ENCOUNTER — Other Ambulatory Visit: Payer: Self-pay | Admitting: *Deleted

## 2013-04-30 MED ORDER — PITAVASTATIN CALCIUM 2 MG PO TABS: 1.0000 | ORAL_TABLET | Freq: Every day | ORAL | Status: DC

## 2013-04-30 MED ORDER — CITALOPRAM HYDROBROMIDE 10 MG PO TABS: 10.0000 mg | ORAL_TABLET | Freq: Every day | ORAL | Status: DC

## 2013-06-08 ENCOUNTER — Telehealth: Payer: Self-pay | Admitting: Cardiovascular Disease

## 2013-06-08 NOTE — Telephone Encounter (Signed)
[employer]. Need a Dx code for Vit D level that was done 11/13/12.  780.79 was not sufficient.  New code given 268.9  Enrique Sack states that it was a supporting code

## 2013-06-08 NOTE — Telephone Encounter (Signed)
Needs a supporting diagnose code .Marland Kitchen Please call

## 2013-08-13 ENCOUNTER — Other Ambulatory Visit (HOSPITAL_COMMUNITY): Payer: Self-pay | Admitting: Cardiovascular Disease

## 2013-08-13 NOTE — Telephone Encounter (Signed)
Rx was sent to pharmacy electronically. 

## 2013-11-05 ENCOUNTER — Other Ambulatory Visit: Payer: Self-pay | Admitting: *Deleted

## 2013-11-26 ENCOUNTER — Other Ambulatory Visit: Payer: Self-pay | Admitting: *Deleted

## 2013-11-26 MED ORDER — METOPROLOL TARTRATE 50 MG PO TABS: 75.0000 mg | ORAL_TABLET | Freq: Two times a day (BID) | ORAL | Status: DC

## 2013-11-26 NOTE — Telephone Encounter (Signed)
Refilled patient's lopressor to pharmacy on file.

## 2014-01-03 ENCOUNTER — Other Ambulatory Visit: Payer: Self-pay | Admitting: *Deleted

## 2014-01-03 MED ORDER — PANTOPRAZOLE SODIUM 40 MG PO TBEC: 40.0000 mg | DELAYED_RELEASE_TABLET | Freq: Every day | ORAL | Status: DC

## 2014-01-21 ENCOUNTER — Other Ambulatory Visit: Payer: Self-pay | Admitting: *Deleted

## 2014-01-21 MED ORDER — LOSARTAN POTASSIUM 100 MG PO TABS: 100.0000 mg | ORAL_TABLET | Freq: Every day | ORAL | Status: DC

## 2014-01-21 MED ORDER — OMEGA-3-ACID ETHYL ESTERS 1 G PO CAPS: 1.0000 g | ORAL_CAPSULE | Freq: Every day | ORAL | Status: DC

## 2014-01-21 MED ORDER — CLOPIDOGREL BISULFATE 75 MG PO TABS: 75.0000 mg | ORAL_TABLET | Freq: Every day | ORAL | Status: DC

## 2014-01-21 NOTE — Telephone Encounter (Signed)
Refilled , lovaza, losartan, and clopidogrel to Julie Bass

## 2014-02-28 ENCOUNTER — Other Ambulatory Visit: Payer: Self-pay | Admitting: *Deleted

## 2014-02-28 MED ORDER — CILOSTAZOL 50 MG PO TABS: ORAL_TABLET | ORAL | Status: DC

## 2014-02-28 MED ORDER — CITALOPRAM HYDROBROMIDE 10 MG PO TABS: 10.0000 mg | ORAL_TABLET | Freq: Every day | ORAL | Status: DC

## 2014-02-28 MED ORDER — EZETIMIBE 10 MG PO TABS: 10.0000 mg | ORAL_TABLET | Freq: Every day | ORAL | Status: DC

## 2014-02-28 MED ORDER — PITAVASTATIN CALCIUM 2 MG PO TABS: 1.0000 | ORAL_TABLET | Freq: Every day | ORAL | Status: DC

## 2014-04-15 ENCOUNTER — Other Ambulatory Visit: Payer: Self-pay

## 2014-04-15 DIAGNOSIS — Z1231 Encounter for screening mammogram for malignant neoplasm of breast: Secondary | ICD-10-CM

## 2014-04-24 ENCOUNTER — Ambulatory Visit
Admission: RE | Admit: 2014-04-24 | Discharge: 2014-04-24 | Disposition: A | Discharge status: Home / Self Care | Payer: Medicare Other | Source: Ambulatory Visit

## 2014-04-24 ENCOUNTER — Encounter (INDEPENDENT_AMBULATORY_CARE_PROVIDER_SITE_OTHER): Payer: Self-pay

## 2014-04-24 DIAGNOSIS — Z1231 Encounter for screening mammogram for malignant neoplasm of breast: Secondary | ICD-10-CM

## 2014-04-26 ENCOUNTER — Other Ambulatory Visit: Payer: Self-pay | Admitting: Family Medicine

## 2014-04-26 DIAGNOSIS — R928 Other abnormal and inconclusive findings on diagnostic imaging of breast: Secondary | ICD-10-CM

## 2014-04-27 ENCOUNTER — Telehealth: Payer: Self-pay | Admitting: Cardiovascular Disease

## 2014-04-29 NOTE — Telephone Encounter (Signed)
Closed encounter °

## 2014-05-07 ENCOUNTER — Encounter: Payer: Self-pay | Admitting: *Deleted

## 2014-05-07 ENCOUNTER — Other Ambulatory Visit: Payer: Medicare Other

## 2014-05-08 ENCOUNTER — Ambulatory Visit (INDEPENDENT_AMBULATORY_CARE_PROVIDER_SITE_OTHER): Payer: Medicare Other | Admitting: Cardiovascular Disease

## 2014-05-08 ENCOUNTER — Encounter: Payer: Self-pay | Admitting: Cardiovascular Disease

## 2014-05-08 ENCOUNTER — Ambulatory Visit
Admission: RE | Admit: 2014-05-08 | Discharge: 2014-05-08 | Disposition: A | Discharge status: Home / Self Care | Payer: Medicare Other | Source: Ambulatory Visit | Attending: Family Medicine | Admitting: Family Medicine

## 2014-05-08 VITALS — BP 196/80 | HR 59 | Ht 63.0 in | Wt 121.1 lb

## 2014-05-08 DIAGNOSIS — I472 Ventricular tachycardia: Secondary | ICD-10-CM

## 2014-05-08 DIAGNOSIS — R5383 Other fatigue: Secondary | ICD-10-CM

## 2014-05-08 DIAGNOSIS — R928 Other abnormal and inconclusive findings on diagnostic imaging of breast: Secondary | ICD-10-CM

## 2014-05-08 DIAGNOSIS — Q203 Discordant ventriculoarterial connection: Secondary | ICD-10-CM

## 2014-05-08 DIAGNOSIS — Z79899 Other long term (current) drug therapy: Secondary | ICD-10-CM

## 2014-05-08 DIAGNOSIS — E8881 Metabolic syndrome: Secondary | ICD-10-CM

## 2014-05-08 DIAGNOSIS — I4729 Other ventricular tachycardia: Secondary | ICD-10-CM

## 2014-05-08 DIAGNOSIS — R5382 Chronic fatigue, unspecified: Secondary | ICD-10-CM

## 2014-05-08 DIAGNOSIS — I10 Essential (primary) hypertension: Secondary | ICD-10-CM

## 2014-05-08 DIAGNOSIS — R5381 Other malaise: Secondary | ICD-10-CM

## 2014-05-08 DIAGNOSIS — E785 Hyperlipidemia, unspecified: Secondary | ICD-10-CM

## 2014-05-08 DIAGNOSIS — I739 Peripheral vascular disease, unspecified: Secondary | ICD-10-CM

## 2014-05-08 MED ORDER — AMLODIPINE BESYLATE 5 MG PO TABS: 5.0000 mg | ORAL_TABLET | Freq: Every day | ORAL | Status: DC

## 2014-05-08 NOTE — Progress Notes (Signed)
Patient ID: Julie Bass, female   DOB: 04/14/1926, 80 y.o.   MRN: [id number]     HPI: Julie Bass is a 80 y.o. female who presents to the office today for a 17 month follow up cardiology evaluation.  Julie Bass has a history of hypertension, hyperlipidemia, peripheral vascular disease, as well as episodes of nonsustained ventricular tachycardia, for, which she's been treated with beta blocker therapy.  Her last echo Doppler study in December 2013 showed hyperdynamic LV function with an ejection fraction of at least 67%, grade 1 diastolic dysfunction, and dynamic obstruction in the mid cavity of the left ventricle with a gradient of 10 mm.  She had severely calcified mitral annulus and mild left atrial dilatation.  She has documented nonsustained ventricular tachycardia, which was picked up on a monitor, and she has tolerated titration of her beta blocker therapy to metoprolol 100 mg in the morning and 75 mg at night with resolution of symptoms.  She also has had difficulty with statin intolerance for hyperlipidemia, but has tolerated live alone, 2 mg daily.  She also has history of hypertension and has been on losartan 100 mg in addition to her metoprolol.  She has peripheral vascular disease and is status post stenting of her left SFA in the past.  She has one vessel runoff bilaterally and has moderate right SFA disease.  Her last LE Doppler study was in August 2013 , which showed occluded right PTA and peroneal occluded left PTA, left ATA with reconstitution of the dorsalis pedis artery.  ABIs were 0.88 on the right and 0.86 on the left.  She has been on aspirin 81 mg, Plavix 75 mg in addition to Pletal 50 mg.  She denies chest pain.  He does have claudication, but this has not significantly changed.  She has had blood pressure lability.  She presents for evaluation.  Past Medical History  Diagnosis Date  . Glaucoma   . Osteoporosis   . Hyperlipidemia   . Hyperlipemia 10/18/2011  . PAD  (peripheral artery disease) 10/18/2011    h/o left SFA stent  . Hypertension     SEES DR. Roxsana Bass   . HTN (hypertension) 10/18/2011  . NSVT (nonsustained ventricular tachycardia) 10/18/2011  . Depression   . Chronic kidney disease     KIDNEY STONES  . Hiatal hernia   . Arthritis   . Dementia     BEGINNING STAGES   . GERD (gastroesophageal reflux disease)     Past Surgical History  Procedure Laterality Date  . Cholecystectomy    . Abdominal hysterectomy  1973  . Cardiac catheterization      2012  . Reverse shoulder arthroplasty Right 01/26/2013    Procedure: RIGHT REVERSE TOTAL SHOULDER ARTHROPLASTY;  Surgeon: Julie Schooling, MD;  Location: [place];  Service: Orthopedics;  Laterality: Right;  . Transthoracic echocardiogram  10/2012    EF 75-70%, mod conc hypertrophy, severely calcified MV annulus, LA mildly dailted, PA peak pressure 21mmHg  . Nm myocar perf wall motion  10/2011    non-gated - normal stuy, EF 82%, normal LV wall motion  . Lower extremity arterial doppler  2013    right SFA with 50-69% diameter reduction, right PTA/peroneal occluded, L SFA prox to stent has narrowing with increased velocities >60% diameter reduction, L SFA stent patent, L ATA w/occlusive disease, bilat ABIs show mild arterial insuffiency at rest  . Lower extremity angiogram  11/02/2007    stent of mid left SFA with 6x135mm  EV3 self-expanding stent and 6x3 in prox region (Dr. Adora Bass)  . Lower extremity angiogram      Allergies  Allergen Reactions  . Crestor [Rosuvastatin Calcium]     Muscle pain  . Lipitor [Atorvastatin Calcium]     Muscle pain    Current Outpatient Prescriptions  Medication Sig Dispense Refill  . amLODipine (NORVASC) 5 MG tablet Take 1 tablet (5 mg total) by mouth daily.  90 tablet  3  . aspirin EC 81 MG tablet Take 81 mg by mouth daily.      . cilostazol (PLETAL) 50 MG tablet TAKE 1 TABLET TWICE DAILY.  180 tablet  3  . citalopram (CELEXA) 10 MG tablet Take 1 tablet (10 mg  total) by mouth daily.  90 tablet  3  . clopidogrel (PLAVIX) 75 MG tablet Take 1 tablet (75 mg total) by mouth daily.  90 tablet  3  . ezetimibe (ZETIA) 10 MG tablet Take 1 tablet (10 mg total) by mouth daily.  90 tablet  3  . losartan (COZAAR) 100 MG tablet Take 1 tablet (100 mg total) by mouth daily.  90 tablet  3  . metoprolol (LOPRESSOR) 50 MG tablet Take 1.5-2 tablets (75-100 mg total) by mouth 2 (two) times daily. Take 100mg  in the morning and 75mg  in the evening  315 tablet  3  . omega-3 acid ethyl esters (LOVAZA) 1 G capsule Take 1 capsule (1 g total) by mouth daily.  90 capsule  3  . pantoprazole (PROTONIX) 40 MG tablet Take 1 tablet (40 mg total) by mouth daily.  90 tablet  3  . Pitavastatin Calcium (LIVALO) 2 MG TABS Take 1 tablet (2 mg total) by mouth daily.  90 tablet  3  . timolol (TIMOPTIC) 0.5 % ophthalmic solution Place 1 drop into both eyes daily.       No current facility-administered medications for this visit.    History   Social History  . Marital Status: Widowed    Spouse Name: N/A    Number of Children: 2  . Years of Education: N/A   Occupational History  . Not on file.   Social History Main Topics  . Smoking status: Former Smoker    Quit date: 05/07/1984  . Smokeless tobacco: Never Used  . Alcohol Use: No  . Drug Use: No  . Sexual Activity: Not Currently   Other Topics Concern  . Not on file   Social History Narrative  . No narrative on file    Family History  Problem Relation Age of Onset  . Breast cancer Sister     cervical cancer  . Colon cancer Brother     throat cancer  . Throat cancer Father   . Cancer Mother   . CAD Son   . Pulmonary embolism Daughter   . Hypertension Daughter     ROS General: Negative; No fevers, chills, or night sweats HEENT: Negative; No changes in vision or hearing, sinus congestion, difficulty swallowing Pulmonary: Negative; No cough, wheezing, shortness of breath, hemoptysis Cardiovascular: See HPI: No  chest pain, presyncope, syncope, palpatations Positive for claudication GI: Positive for GERD; No nausea, vomiting, diarrhea, or abdominal pain GU: Negative; No dysuria, hematuria, or difficulty voiding Musculoskeletal: Negative; no myalgias, joint pain, or weakness Hematologic: Negative; no easy bruising, bleeding Endocrine: Negative; no heat/cold intolerance; no diabetes, Neuro: Negative; no changes in balance, headaches Skin: Negative; No rashes or skin lesions Psychiatric: Negative; No behavioral problems, depression Sleep: Negative; No snoring,  daytime sleepiness, hypersomnolence, bruxism, restless legs, hypnogognic hallucinations. Other comprehensive 14 point system review is negative   Physical Exam BP 196/80  Pulse 59  Ht 5\' 3"  (1.6 m)  Wt 121 lb 1.6 oz (54.931 kg)  BMI 21.46 kg/m2 General: Alert, oriented, no distress.  Skin: normal turgor, no rashes, warm and dry HEENT: Normocephalic, atraumatic. Pupils equal round and reactive to light; sclera anicteric; extraocular muscles intact, No lid lag; Nose without nasal septal hypertrophy; Mouth/Parynx benign; Mallinpatti scale 2 Neck: No JVD, no carotid bruits; normal carotid upstroke Lungs: clear to ausculatation and percussion bilaterally; no wheezing or rales, normal inspiratory and expiratory effort Chest wall: without tenderness to palpitation Heart: PMI not displaced, RRR, s1 s2 normal, 2/6 systolic murmur left sternal border, No diastolic murmur, no rubs, gallops, thrills, or heaves Abdomen: soft, nontender; no hepatosplenomehaly, BS+; abdominal aorta nontender and not dilated by palpation. Back: no CVA tenderness Pulses: 2+ upper extremity.  1+ distally  Musculoskeletal: full range of motion, normal strength, no joint deformities Extremities: Pulses 2+, no clubbing cyanosis or edema, Homan's sign negative  Neurologic: grossly nonfocal; Cranial nerves grossly wnl Psychologic: Normal mood and affect   ECG (independently  read by me): Sinus bradycardia 59 beats per minute.  First degree AV block with PR interval 240 ms.  Nonspecific ST-T changes.  LABS:  BMET    Component Value Date/Time   NA 136 01/27/2013 0618   K 3.6 01/27/2013 0618   CL 105 01/27/2013 0618   CO2 25 01/27/2013 0618   GLUCOSE 136* 01/27/2013 0618   BUN 9 01/27/2013 0618   CREATININE 0.95 01/27/2013 0618   CREATININE 1.15* 09/29/2011 1629   CALCIUM 8.8 01/27/2013 0618   GFRNONAA 53* 01/27/2013 0618   GFRAA 62* 01/27/2013 0618     Hepatic Function Panel     Component Value Date/Time   PROT 6.9 01/24/2013 1547   ALBUMIN 3.6 01/24/2013 1547   AST 19 01/24/2013 1547   ALT 17 01/24/2013 1547   ALKPHOS 42 01/24/2013 1547   BILITOT 0.7 01/24/2013 1547     CBC    Component Value Date/Time   WBC 8.8 01/26/2013 1301   RBC 4.34 01/26/2013 1301   HGB 11.5* 01/27/2013 0618   HCT 34.0* 01/27/2013 0618   PLT 150 01/26/2013 1301   MCV 90.8 01/26/2013 1301   MCH 31.1 01/26/2013 1301   MCHC 34.3 01/26/2013 1301   RDW 13.1 01/26/2013 1301   LYMPHSABS 2.8 09/29/2011 1614   MONOABS 0.7 09/29/2011 1614   EOSABS 0.1 09/29/2011 1614   BASOSABS 0.0 09/29/2011 1614     BNP    Component Value Date/Time   PROBNP 93.7 10/18/2011 2002    Lipid Panel  No results found for this basename: chol, trig, hdl, cholhdl, vldl, ldlcalc     RADIOLOGY: Mm Digital Screening Bilateral  04/25/2014   CLINICAL DATA:  Screening.  EXAM: DIGITAL SCREENING BILATERAL MAMMOGRAM WITH CAD  COMPARISON:  02/17/2012, 09/22/2010.  ACR Breast Density Category b: There are scattered areas of fibroglandular density.  FINDINGS: In the right breast, skin thickening warrants further evaluation with physical examination and possibly ultrasound. In the left breast, no findings suspicious for malignancy. Images were processed with CAD.  IMPRESSION: Further evaluation is suggested for possible skin thickening in the right breast.  RECOMMENDATION: Physical examination and possibly ultrasound of  the right breast. (Code:FI-R-4M)  The patient will be contacted regarding the findings, and additional imaging will be scheduled.  BI-RADS CATEGORY  0: Incomplete. Need  additional imaging evaluation and/or prior mammograms for comparison.   Electronically Signed   By: Luberta Robertson M.D.   On: 04/25/2014 07:41   US Breast Ltd Uni Right Inc Axilla  05/08/2014   CLINICAL DATA:  Screening callback for right breast skin thickening asymmetrically in the inferior breast. The patient denies a history of congestive heart failure. She had right shoulder surgery approximately 1 year ago but denies right arm asymmetric edema.  EXAM: ULTRASOUND OF THE right BREAST  COMPARISON:  Prior mammograms  FINDINGS: On physical exam, I palpate no abnormality in the left breast 6 o'clock/ inferior breast. There is no skin thickening, induration, or erythema.  Ultrasound is performed, showing minimal skin thickening in the right inferior breast measuring 3 mm as compared to the right breast 12 o'clock location measuring 2 mm in maximal thickness. No underlying intramammary abnormality is identified.  IMPRESSION: Minimal asymmetric prominence of the right inferior breast skin without underlying intramammary abnormality. There is no evidence for active infection or other dermatologic abnormality. This is of unclear clinical significance and clinical followup is recommended as part of the patient's routine healthcare maintenance.  RECOMMENDATION: Screening mammogram in one year.(Code:SM-B-01Y)  I have discussed the findings and recommendations with the patient. Results were also provided in writing at the conclusion of the visit. If applicable, a reminder letter will be sent to the patient regarding the next appointment.  BI-RADS CATEGORY  1: Negative.   Electronically Signed   By: Conchita Paris M.D.   On: 05/08/2014 15:34      ASSESSMENT AND PLAN: Ms. Julie Bass is an 80 year old female who has documented history of  hypertension, moderate, concentric left ventricular hypertrophy with LVOT gradient at the midcavity level of 10 mm at her last echo study.  She is severely calcified.  Mitral annular calcification and mild pulmonary hypertension.  She is significantly hypertensive today. She is taking metoprolol 100 mg in the morning and 75 mg in the evening in addition to losartan 100 mg daily.  I am adding amlodipine and giving her 5 mg prescription.  She will start this at 2.5 mg for the first week and if her blood pressure consistently is greater than 150 she will increase this to 5 mg.  She is unaware of any palpitations on her current beta blocker regimen.  I'm scheduling her for followup echo Doppler study to reevaluate her systolic and diastolic function and valvular architecture.  I am also scheduling her for followup  lower extremity arterial Doppler studies.  A complete set of blood work will be obtained in the fasting state, including a vitamin D level, with a significant history of previous vitamin D deficiency.  Will check hemoglobin A1c, CBC, comprehensive metabolic panel, TSH, and lipid studies.  I will see her back in the office in approximately 2 to 3 months for followup evaluation or sooner if problems arise.     Troy Sine, MD, [facility]  05/08/2014 5:26 PM

## 2014-05-08 NOTE — Patient Instructions (Signed)
Dr Claiborne Billings has recommended making the following medication changes:  START Amlodipine 5 mg - take 1/2 tablet daily for 1-2 weeks  If blood pressure stays above 150 take 1 tablet daily.  Dr Claiborne Billings has recommended you have blood work to be done FASTING. (CBC, CMET, TSH, Hgb A1c, Vitamin D, and Lipid panel)  Your physician has requested that you have an echocardiogram. Echocardiography is a painless test that uses sound waves to create images of your heart. It provides your doctor with information about the size and shape of your heart and how well your heart's chambers and valves are working. This procedure takes approximately one hour. There are no restrictions for this procedure.  Your physician has requested that you have a lower or upper extremity arterial duplex. This test is an ultrasound of the arteries in the legs or arms. It looks at arterial blood flow in the legs and arms. Allow one hour for Lower and Upper Arterial scans. There are no restrictions or special instructions  Your physician recommends that you schedule a follow-up appointment in 2-3 months with Dr Claiborne Billings.

## 2014-05-15 ENCOUNTER — Ambulatory Visit (HOSPITAL_BASED_OUTPATIENT_CLINIC_OR_DEPARTMENT_OTHER)
Admission: RE | Admit: 2014-05-15 | Discharge: 2014-05-15 | Disposition: A | Discharge status: Home / Self Care | Payer: Medicare Other | Source: Ambulatory Visit | Attending: Cardiovascular Disease | Admitting: Cardiovascular Disease

## 2014-05-15 ENCOUNTER — Ambulatory Visit (HOSPITAL_COMMUNITY)
Admission: RE | Admit: 2014-05-15 | Discharge: 2014-05-15 | Disposition: A | Discharge status: Home / Self Care | Payer: Medicare Other | Source: Ambulatory Visit | Attending: Cardiovascular Disease | Admitting: Cardiovascular Disease

## 2014-05-15 DIAGNOSIS — Q203 Discordant ventriculoarterial connection: Secondary | ICD-10-CM | POA: Insufficient documentation

## 2014-05-15 DIAGNOSIS — I369 Nonrheumatic tricuspid valve disorder, unspecified: Secondary | ICD-10-CM

## 2014-05-15 DIAGNOSIS — I739 Peripheral vascular disease, unspecified: Secondary | ICD-10-CM

## 2014-05-15 DIAGNOSIS — R0789 Other chest pain: Secondary | ICD-10-CM

## 2014-05-15 DIAGNOSIS — I70219 Atherosclerosis of native arteries of extremities with intermittent claudication, unspecified extremity: Secondary | ICD-10-CM

## 2014-05-15 DIAGNOSIS — I10 Essential (primary) hypertension: Secondary | ICD-10-CM

## 2014-05-15 LAB — CBC
HCT: 42.2 % (ref 36.0–46.0)
Hemoglobin: 14.1 g/dL (ref 12.0–15.0)
MCH: 30.8 pg (ref 26.0–34.0)
MCHC: 33.4 g/dL (ref 30.0–36.0)
MCV: 92.1 fL (ref 78.0–100.0)
Platelets: 201 10*3/uL (ref 150–400)
RBC: 4.58 MIL/uL (ref 3.87–5.11)
RDW: 13.5 % (ref 11.5–15.5)
WBC: 5.9 10*3/uL (ref 4.0–10.5)

## 2014-05-15 LAB — HEMOGLOBIN A1C
Hgb A1c MFr Bld: 5.6 % (ref <5.7)
Mean Plasma Glucose: 114 mg/dL (ref <117)

## 2014-05-15 LAB — COMPREHENSIVE METABOLIC PANEL
ALT: 20 U/L (ref 0–35)
AST: 21 U/L (ref 0–37)
Albumin: 4.4 g/dL (ref 3.5–5.2)
Alkaline Phosphatase: 42 U/L (ref 39–117)
BUN: 11 mg/dL (ref 6–23)
CO2: 29 mEq/L (ref 19–32)
Calcium: 9.8 mg/dL (ref 8.4–10.5)
Chloride: 106 mEq/L (ref 96–112)
Creat: 1.14 mg/dL — ABNORMAL HIGH (ref 0.50–1.10)
Glucose, Bld: 104 mg/dL — ABNORMAL HIGH (ref 70–99)
Potassium: 4.7 mEq/L (ref 3.5–5.3)
Sodium: 142 mEq/L (ref 135–145)
Total Bilirubin: 1.1 mg/dL (ref 0.2–1.2)
Total Protein: 6.8 g/dL (ref 6.0–8.3)

## 2014-05-15 LAB — LIPID PANEL
Cholesterol: 151 mg/dL (ref 0–200)
HDL: 73 mg/dL (ref >39)
LDL Cholesterol: 58 mg/dL (ref 0–99)
Total CHOL/HDL Ratio: 2.1 Ratio
Triglycerides: 100 mg/dL (ref <150)
VLDL: 20 mg/dL (ref 0–40)

## 2014-05-15 LAB — TSH: TSH: 2.203 u[IU]/mL (ref 0.350–4.500)

## 2014-05-15 NOTE — Progress Notes (Signed)
Arterial Duplex Lower Ext. Completed. Michon Kaczmarek, BS, RDMS, RVT  

## 2014-05-15 NOTE — Progress Notes (Signed)
2D Echocardiogram Complete.  05/15/2014   Griffon Herberg, RDCS

## 2014-05-19 LAB — VITAMIN D 1,25 DIHYDROXY
Vitamin D 1, 25 (OH)2 Total: 81 pg/mL — ABNORMAL HIGH (ref 18–72)
Vitamin D2 1, 25 (OH)2: <8 pg/mL
Vitamin D3 1, 25 (OH)2: 81 pg/mL

## 2014-05-27 ENCOUNTER — Ambulatory Visit: Payer: Medicare Other | Admitting: Cardiovascular Disease

## 2014-06-06 ENCOUNTER — Ambulatory Visit: Payer: Medicare Other | Admitting: Cardiovascular Disease

## 2014-08-02 ENCOUNTER — Ambulatory Visit (INDEPENDENT_AMBULATORY_CARE_PROVIDER_SITE_OTHER): Payer: Medicare Other

## 2014-08-02 VITALS — BP 157/72 | HR 68 | Resp 16

## 2014-08-02 DIAGNOSIS — I739 Peripheral vascular disease, unspecified: Secondary | ICD-10-CM

## 2014-08-02 DIAGNOSIS — B351 Tinea unguium: Secondary | ICD-10-CM

## 2014-08-02 DIAGNOSIS — M79609 Pain in unspecified limb: Secondary | ICD-10-CM

## 2014-08-02 DIAGNOSIS — M79676 Pain in unspecified toe(s): Secondary | ICD-10-CM

## 2014-08-02 NOTE — Progress Notes (Signed)
   Subjective:    Patient ID: Julie Bass, female    DOB: 1925-12-12, 80 y.o.   MRN: [id number]  HPI Comments: "She has these toenails that look bad"  Patient c/o thick, discolored, tender toenails, bilateral feet, for several years. She needs them clipped. Her shoes are starting to become uncomfortable.     Review of Systems  Constitutional: Positive for fatigue.  Respiratory: Positive for cough.   Musculoskeletal: Positive for arthralgias, back pain and gait problem.  All other systems reviewed and are negative.      Objective:   Physical Exam 80 year old [place] female well-developed well-nourished although not will oriented at this time is mild dementia. Poor short-term memory.  Lower extremity objective findings reveal thick and brittle informed criptotic nails painful symptomatic 1 through 5 bilaterally debridement. Patient has vascular status is diminished with a history PDD nonpalpable pulses DP or PT bilateral capillary refill time 5 seconds skin temperature warm to cool turgor greatly diminished mild varicosities noted no edema noted. Neurologically epicritic and proprioceptive sensations intact and symmetric bilateral there is normal plantar response DTRs not listed dermatologic the skin color pigment normal hair growth absent nails extremely friable discolored yellow yellow brittle and thickened with incurvation and deformity 1 through 5 bilateral hallux bilateral most severe painful and symptomatic. Patient significant HAV deformity as well as digital contractures lateral deviation of the hallux with a track bound hallux noted as well no open wounds or ulcers no secondary infections no x-rays taken at this time.       Assessment & Plan:  Assessment history PDD vascular compromise as well as dystrophic painful mycotic nails which are debrided x10 at this time the presence of pain in symptomology as well as vascular compromise may recommendations for appropriate  accommodative shoes recommend socks and shoes at all times recheck in 3 months for followup and future palliative care is needed next  Harriet Masson DPM

## 2014-08-02 NOTE — Patient Instructions (Signed)

## 2014-08-13 ENCOUNTER — Ambulatory Visit: Payer: Medicare Other | Admitting: Cardiovascular Disease

## 2014-08-23 ENCOUNTER — Encounter (HOSPITAL_COMMUNITY): Payer: Self-pay | Admitting: Emergency Medicine

## 2014-08-23 ENCOUNTER — Inpatient Hospital Stay (HOSPITAL_COMMUNITY)
Admission: EM | Admit: 2014-08-23 | Discharge: 2014-08-27 | DRG: 312 | Disposition: A | Discharge status: Home Health | Payer: Medicare Other | Attending: Cardiology | Admitting: Cardiology

## 2014-08-23 ENCOUNTER — Observation Stay (HOSPITAL_COMMUNITY): Payer: Medicare Other

## 2014-08-23 ENCOUNTER — Emergency Department (HOSPITAL_COMMUNITY): Payer: Medicare Other

## 2014-08-23 DIAGNOSIS — I10 Essential (primary) hypertension: Secondary | ICD-10-CM

## 2014-08-23 DIAGNOSIS — S40011A Contusion of right shoulder, initial encounter: Secondary | ICD-10-CM | POA: Diagnosis present

## 2014-08-23 DIAGNOSIS — M810 Age-related osteoporosis without current pathological fracture: Secondary | ICD-10-CM | POA: Diagnosis present

## 2014-08-23 DIAGNOSIS — Z96611 Presence of right artificial shoulder joint: Secondary | ICD-10-CM | POA: Diagnosis present

## 2014-08-23 DIAGNOSIS — R109 Unspecified abdominal pain: Secondary | ICD-10-CM | POA: Diagnosis present

## 2014-08-23 DIAGNOSIS — I951 Orthostatic hypotension: Principal | ICD-10-CM | POA: Diagnosis present

## 2014-08-23 DIAGNOSIS — Z7982 Long term (current) use of aspirin: Secondary | ICD-10-CM

## 2014-08-23 DIAGNOSIS — Z7902 Long term (current) use of antithrombotics/antiplatelets: Secondary | ICD-10-CM

## 2014-08-23 DIAGNOSIS — E785 Hyperlipidemia, unspecified: Secondary | ICD-10-CM

## 2014-08-23 DIAGNOSIS — R5381 Other malaise: Secondary | ICD-10-CM | POA: Diagnosis present

## 2014-08-23 DIAGNOSIS — Z808 Family history of malignant neoplasm of other organs or systems: Secondary | ICD-10-CM

## 2014-08-23 DIAGNOSIS — I471 Supraventricular tachycardia, unspecified: Secondary | ICD-10-CM

## 2014-08-23 DIAGNOSIS — W19XXXA Unspecified fall, initial encounter: Secondary | ICD-10-CM

## 2014-08-23 DIAGNOSIS — Z889 Allergy status to unspecified drugs, medicaments and biological substances status: Secondary | ICD-10-CM

## 2014-08-23 DIAGNOSIS — H409 Unspecified glaucoma: Secondary | ICD-10-CM | POA: Diagnosis present

## 2014-08-23 DIAGNOSIS — N189 Chronic kidney disease, unspecified: Secondary | ICD-10-CM | POA: Diagnosis present

## 2014-08-23 DIAGNOSIS — K219 Gastro-esophageal reflux disease without esophagitis: Secondary | ICD-10-CM | POA: Diagnosis present

## 2014-08-23 DIAGNOSIS — I739 Peripheral vascular disease, unspecified: Secondary | ICD-10-CM | POA: Diagnosis present

## 2014-08-23 DIAGNOSIS — IMO0001 Reserved for inherently not codable concepts without codable children: Secondary | ICD-10-CM

## 2014-08-23 DIAGNOSIS — Z8249 Family history of ischemic heart disease and other diseases of the circulatory system: Secondary | ICD-10-CM

## 2014-08-23 DIAGNOSIS — F0390 Unspecified dementia without behavioral disturbance: Secondary | ICD-10-CM | POA: Diagnosis present

## 2014-08-23 DIAGNOSIS — Z800 Family history of malignant neoplasm of digestive organs: Secondary | ICD-10-CM

## 2014-08-23 DIAGNOSIS — R55 Syncope and collapse: Secondary | ICD-10-CM | POA: Diagnosis not present

## 2014-08-23 DIAGNOSIS — M1990 Unspecified osteoarthritis, unspecified site: Secondary | ICD-10-CM | POA: Diagnosis present

## 2014-08-23 DIAGNOSIS — I129 Hypertensive chronic kidney disease with stage 1 through stage 4 chronic kidney disease, or unspecified chronic kidney disease: Secondary | ICD-10-CM | POA: Diagnosis present

## 2014-08-23 DIAGNOSIS — R040 Epistaxis: Secondary | ICD-10-CM | POA: Diagnosis present

## 2014-08-23 DIAGNOSIS — Z87891 Personal history of nicotine dependence: Secondary | ICD-10-CM

## 2014-08-23 DIAGNOSIS — I4729 Other ventricular tachycardia: Secondary | ICD-10-CM

## 2014-08-23 DIAGNOSIS — Z803 Family history of malignant neoplasm of breast: Secondary | ICD-10-CM

## 2014-08-23 DIAGNOSIS — Z23 Encounter for immunization: Secondary | ICD-10-CM

## 2014-08-23 DIAGNOSIS — S40021A Contusion of right upper arm, initial encounter: Secondary | ICD-10-CM

## 2014-08-23 DIAGNOSIS — Z8049 Family history of malignant neoplasm of other genital organs: Secondary | ICD-10-CM

## 2014-08-23 DIAGNOSIS — F329 Major depressive disorder, single episode, unspecified: Secondary | ICD-10-CM | POA: Diagnosis present

## 2014-08-23 DIAGNOSIS — W1830XA Fall on same level, unspecified, initial encounter: Secondary | ICD-10-CM | POA: Diagnosis present

## 2014-08-23 DIAGNOSIS — I472 Ventricular tachycardia: Secondary | ICD-10-CM

## 2014-08-23 DIAGNOSIS — I440 Atrioventricular block, first degree: Secondary | ICD-10-CM | POA: Diagnosis present

## 2014-08-23 LAB — CBC WITH DIFFERENTIAL/PLATELET
Basophils Absolute: 0 10*3/uL (ref 0.0–0.1)
Basophils Relative: 0 % (ref 0–1)
Eosinophils Absolute: 0.1 10*3/uL (ref 0.0–0.7)
Eosinophils Relative: 1 % (ref 0–5)
HCT: 43 % (ref 36.0–46.0)
Hemoglobin: 14.4 g/dL (ref 12.0–15.0)
Lymphocytes Relative: 11 % — ABNORMAL LOW (ref 12–46)
Lymphs Abs: 1.4 10*3/uL (ref 0.7–4.0)
MCH: 31 pg (ref 26.0–34.0)
MCHC: 33.5 g/dL (ref 30.0–36.0)
MCV: 92.5 fL (ref 78.0–100.0)
Monocytes Absolute: 0.8 10*3/uL (ref 0.1–1.0)
Monocytes Relative: 6 % (ref 3–12)
Neutro Abs: 11 10*3/uL — ABNORMAL HIGH (ref 1.7–7.7)
Neutrophils Relative %: 82 % — ABNORMAL HIGH (ref 43–77)
Platelets: 172 10*3/uL (ref 150–400)
RBC: 4.65 MIL/uL (ref 3.87–5.11)
RDW: 12.9 % (ref 11.5–15.5)
WBC: 13.3 10*3/uL — ABNORMAL HIGH (ref 4.0–10.5)

## 2014-08-23 LAB — COMPREHENSIVE METABOLIC PANEL
ALT: 14 U/L (ref 0–35)
AST: 28 U/L (ref 0–37)
Albumin: 4 g/dL (ref 3.5–5.2)
Alkaline Phosphatase: 45 U/L (ref 39–117)
Anion gap: 12 (ref 5–15)
BUN: 13 mg/dL (ref 6–23)
CO2: 25 mEq/L (ref 19–32)
Calcium: 9.7 mg/dL (ref 8.4–10.5)
Chloride: 104 mEq/L (ref 96–112)
Creatinine, Ser: 0.95 mg/dL (ref 0.50–1.10)
GFR calc Af Amer: 61 mL/min — ABNORMAL LOW (ref >90)
GFR calc non Af Amer: 53 mL/min — ABNORMAL LOW (ref >90)
Glucose, Bld: 116 mg/dL — ABNORMAL HIGH (ref 70–99)
Potassium: 4.3 mEq/L (ref 3.7–5.3)
Sodium: 141 mEq/L (ref 137–147)
Total Bilirubin: 0.9 mg/dL (ref 0.3–1.2)
Total Protein: 7.5 g/dL (ref 6.0–8.3)

## 2014-08-23 LAB — URINALYSIS, ROUTINE W REFLEX MICROSCOPIC
Bilirubin Urine: NEGATIVE
Glucose, UA: NEGATIVE mg/dL
Hgb urine dipstick: NEGATIVE
Ketones, ur: NEGATIVE mg/dL
Leukocytes, UA: NEGATIVE
Nitrite: NEGATIVE
Protein, ur: NEGATIVE mg/dL
Specific Gravity, Urine: 1.009 (ref 1.005–1.030)
Urobilinogen, UA: 0.2 mg/dL (ref 0.0–1.0)
pH: 7.5 (ref 5.0–8.0)

## 2014-08-23 LAB — CBG MONITORING, ED: Glucose-Capillary: 102 mg/dL — ABNORMAL HIGH (ref 70–99)

## 2014-08-23 LAB — TROPONIN I: Troponin I: <0.3 ng/mL (ref <0.30)

## 2014-08-23 LAB — I-STAT TROPONIN, ED: Troponin i, poc: 0 ng/mL (ref 0.00–0.08)

## 2014-08-23 MED ORDER — ASPIRIN EC 81 MG PO TBEC
81.0000 mg | DELAYED_RELEASE_TABLET | Freq: Every day | ORAL | Status: DC
Start: 2014-08-23 — End: 2014-08-27
  Administered 2014-08-23 – 2014-08-27 (×5): 81 mg via ORAL
  Filled 2014-08-23 (×5): qty 1

## 2014-08-23 MED ORDER — ONDANSETRON HCL 4 MG/2ML IJ SOLN
4.0000 mg | Freq: Once | INTRAMUSCULAR | Status: AC
  Administered 2014-08-23: 4 mg via INTRAVENOUS
  Filled 2014-08-23: qty 2

## 2014-08-23 MED ORDER — NITROGLYCERIN 0.4 MG SL SUBL: 0.4000 mg | SUBLINGUAL_TABLET | SUBLINGUAL | Status: DC | PRN

## 2014-08-23 MED ORDER — CILOSTAZOL 50 MG PO TABS
50.0000 mg | ORAL_TABLET | Freq: Two times a day (BID) | ORAL | Status: DC
  Administered 2014-08-23 – 2014-08-27 (×8): 50 mg via ORAL
  Filled 2014-08-23 (×9): qty 1

## 2014-08-23 MED ORDER — METOPROLOL TARTRATE 50 MG PO TABS
75.0000 mg | ORAL_TABLET | Freq: Every day | ORAL | Status: DC
  Administered 2014-08-24 – 2014-08-27 (×4): 75 mg via ORAL
  Filled 2014-08-23 (×4): qty 1

## 2014-08-23 MED ORDER — HYDROCODONE-ACETAMINOPHEN 5-325 MG PO TABS
2.0000 | ORAL_TABLET | Freq: Once | ORAL | Status: AC
  Administered 2014-08-23: 2 via ORAL
  Filled 2014-08-23: qty 2

## 2014-08-23 MED ORDER — OXYMETAZOLINE HCL 0.05 % NA SOLN
2.0000 | Freq: Once | NASAL | Status: AC
  Administered 2014-08-23: 2 via NASAL
  Filled 2014-08-23: qty 15

## 2014-08-23 MED ORDER — CLOPIDOGREL BISULFATE 75 MG PO TABS
75.0000 mg | ORAL_TABLET | Freq: Every day | ORAL | Status: DC
  Administered 2014-08-24 – 2014-08-27 (×4): 75 mg via ORAL
  Filled 2014-08-23 (×4): qty 1

## 2014-08-23 MED ORDER — CITALOPRAM HYDROBROMIDE 10 MG PO TABS
10.0000 mg | ORAL_TABLET | Freq: Every day | ORAL | Status: DC
  Administered 2014-08-23 – 2014-08-27 (×5): 10 mg via ORAL
  Filled 2014-08-23 (×5): qty 1

## 2014-08-23 MED ORDER — LOSARTAN POTASSIUM 50 MG PO TABS
100.0000 mg | ORAL_TABLET | Freq: Every day | ORAL | Status: DC
  Administered 2014-08-24: 100 mg via ORAL
  Filled 2014-08-23: qty 2

## 2014-08-23 MED ORDER — METOPROLOL TARTRATE 100 MG PO TABS
100.0000 mg | ORAL_TABLET | Freq: Every day | ORAL | Status: DC
  Administered 2014-08-24 – 2014-08-27 (×4): 100 mg via ORAL
  Filled 2014-08-23 (×4): qty 1

## 2014-08-23 MED ORDER — PRAVASTATIN SODIUM 40 MG PO TABS
40.0000 mg | ORAL_TABLET | Freq: Every day | ORAL | Status: DC
Start: 2014-08-24 — End: 2014-08-27
  Administered 2014-08-24 – 2014-08-27 (×4): 40 mg via ORAL
  Filled 2014-08-23 (×4): qty 1

## 2014-08-23 MED ORDER — AMLODIPINE BESYLATE 5 MG PO TABS
5.0000 mg | ORAL_TABLET | Freq: Every day | ORAL | Status: DC
  Administered 2014-08-23 – 2014-08-24 (×2): 5 mg via ORAL
  Filled 2014-08-23 (×2): qty 1

## 2014-08-23 MED ORDER — TIMOLOL MALEATE 0.5 % OP SOLN
1.0000 [drp] | Freq: Two times a day (BID) | OPHTHALMIC | Status: DC
  Administered 2014-08-23 – 2014-08-27 (×8): 1 [drp] via OPHTHALMIC
  Filled 2014-08-23: qty 5

## 2014-08-23 MED ORDER — PANTOPRAZOLE SODIUM 40 MG PO TBEC
40.0000 mg | DELAYED_RELEASE_TABLET | Freq: Every day | ORAL | Status: DC
  Administered 2014-08-24 – 2014-08-27 (×4): 40 mg via ORAL
  Filled 2014-08-23 (×4): qty 1

## 2014-08-23 MED ORDER — OMEGA-3-ACID ETHYL ESTERS 1 G PO CAPS
1.0000 g | ORAL_CAPSULE | Freq: Every day | ORAL | Status: DC
  Administered 2014-08-23 – 2014-08-27 (×5): 1 g via ORAL
  Filled 2014-08-23 (×5): qty 1

## 2014-08-23 MED ORDER — ONDANSETRON HCL 4 MG/2ML IJ SOLN: 4.0000 mg | Freq: Four times a day (QID) | INTRAMUSCULAR | Status: DC | PRN

## 2014-08-23 MED ORDER — HEPARIN SODIUM (PORCINE) 5000 UNIT/ML IJ SOLN
5000.0000 [IU] | Freq: Three times a day (TID) | INTRAMUSCULAR | Status: DC
  Administered 2014-08-23 – 2014-08-27 (×10): 5000 [IU] via SUBCUTANEOUS
  Filled 2014-08-23 (×14): qty 1

## 2014-08-23 MED ORDER — ACETAMINOPHEN 325 MG PO TABS
650.0000 mg | ORAL_TABLET | ORAL | Status: DC | PRN
  Administered 2014-08-27: 650 mg via ORAL
  Filled 2014-08-23: qty 2

## 2014-08-23 MED ORDER — EZETIMIBE 10 MG PO TABS
10.0000 mg | ORAL_TABLET | Freq: Every day | ORAL | Status: DC
  Administered 2014-08-24 – 2014-08-27 (×4): 10 mg via ORAL
  Filled 2014-08-23 (×4): qty 1

## 2014-08-23 NOTE — ED Provider Notes (Signed)
CSN: [id number]     Arrival date & time 08/23/14  1215 History   First MD Initiated Contact with Patient 08/23/14 1245     Chief Complaint  Patient presents with  . Loss of Consciousness     (Consider location/radiation/quality/duration/timing/severity/associated sxs/prior Treatment) HPI Patient reports she went to the grocery store today and felt fine. As she was getting ready to take her groceries into the house she had acute onset of loss of consciousness without any type of warning. She states when she woke up she was laying on her right side. Patient had a right shoulder replacement done 1-1/2 years ago. She complains of pain in her right shoulder since she fell. He is able to scoot herself over to the porch to help get herself up. She denies feeling dizziness, lightheadedness, chest pain, or any other injury other than in her right shoulder. Daughter states patient has a history of SVT that was diagnosed before with a syncopal episode without warning. She is being followed by Dr. Claiborne Billings, cardiologist. Patient is right-handed. Patient has had a nosebleed since she fell. Patient is on Plavix. She denies pain to her nose or face. She denies headache.  Daughter states she stopped the patient's Zetia and livalo because the patient was complaining of worsening pain and weakness in her legs. Patient has peripheral vascular disease at baseline. Daughter states she seems better since she stopped those medications.  Patient also has a large mass in her left neck that has had a biopsy. She has been offered to have it removed by ENT however she does not want to have that done at this time.  PCP Dr [facility] Dr Claiborne Billings  Past Medical History  Diagnosis Date  . Glaucoma   . Osteoporosis   . Hyperlipidemia   . Hyperlipemia 10/18/2011  . PAD (peripheral artery disease) 10/18/2011    h/o left SFA stent  . Hypertension     SEES DR. KELLY   . HTN (hypertension) 10/18/2011  . NSVT (nonsustained  ventricular tachycardia) 10/18/2011  . Depression   . Chronic kidney disease     KIDNEY STONES  . Hiatal hernia   . Arthritis   . Dementia     BEGINNING STAGES   . GERD (gastroesophageal reflux disease)    Past Surgical History  Procedure Laterality Date  . Cholecystectomy    . Abdominal hysterectomy  1973  . Cardiac catheterization      2012  . Reverse shoulder arthroplasty Right 01/26/2013    Procedure: RIGHT REVERSE TOTAL SHOULDER ARTHROPLASTY;  Surgeon: Augustin Schooling, MD;  Location: [place];  Service: Orthopedics;  Laterality: Right;  . Transthoracic echocardiogram  10/2012    EF 75-70%, mod conc hypertrophy, severely calcified MV annulus, LA mildly dailted, PA peak pressure 16mmHg  . Nm myocar perf wall motion  10/2011    non-gated - normal stuy, EF 82%, normal LV wall motion  . Lower extremity arterial doppler  2013    right SFA with 50-69% diameter reduction, right PTA/peroneal occluded, L SFA prox to stent has narrowing with increased velocities >60% diameter reduction, L SFA stent patent, L ATA w/occlusive disease, bilat ABIs show mild arterial insuffiency at rest  . Lower extremity angiogram  11/02/2007    stent of mid left SFA with 6x185mm EV3 self-expanding stent and 6x3 in prox region (Dr. Adora Fridge)  . Lower extremity angiogram     Family History  Problem Relation Age of Onset  . Breast cancer Sister  cervical cancer  . Colon cancer Brother     throat cancer  . Throat cancer Father   . Cancer Mother   . CAD Son   . Pulmonary embolism Daughter   . Hypertension Daughter    History  Substance Use Topics  . Smoking status: Former Smoker    Quit date: 05/07/1984  . Smokeless tobacco: Never Used  . Alcohol Use: No   Lives at home Lives alone  OB History   Grav Para Term Preterm Abortions TAB SAB Ect Mult Living                 Review of Systems  All other systems reviewed and are negative.     Allergies  Crestor and Lipitor  Home Medications    Prior to Admission medications   Medication Sig Start Date End Date Taking? Authorizing Provider  amLODipine (NORVASC) 5 MG tablet Take 1 tablet (5 mg total) by mouth daily. 05/08/14  Yes Troy Sine, MD  aspirin EC 81 MG tablet Take 81 mg by mouth daily.   Yes Historical Provider, MD  cilostazol (PLETAL) 50 MG tablet Take 50 mg by mouth 2 (two) times daily.   Yes Historical Provider, MD  citalopram (CELEXA) 10 MG tablet Take 1 tablet (10 mg total) by mouth daily. 02/28/14  Yes Troy Sine, MD  clopidogrel (PLAVIX) 75 MG tablet Take 1 tablet (75 mg total) by mouth daily. 01/21/14  Yes Troy Sine, MD  losartan (COZAAR) 100 MG tablet Take 1 tablet (100 mg total) by mouth daily. 01/21/14  Yes Troy Sine, MD  metoprolol (LOPRESSOR) 50 MG tablet Take 1.5-2 tablets (75-100 mg total) by mouth 2 (two) times daily. Take 100mg  in the morning and 75mg  in the evening 11/26/13  Yes Troy Sine, MD  omega-3 acid ethyl esters (LOVAZA) 1 G capsule Take 1 capsule (1 g total) by mouth daily. 01/21/14  Yes Troy Sine, MD  pantoprazole (PROTONIX) 40 MG tablet Take 1 tablet (40 mg total) by mouth daily. 01/03/14  Yes Troy Sine, MD  Pitavastatin Calcium (LIVALO) 2 MG TABS Take 1 tablet (2 mg total) by mouth daily. 02/28/14  Yes Troy Sine, MD  timolol (TIMOPTIC) 0.5 % ophthalmic solution Place 1 drop into both eyes 2 (two) times daily.    Yes Historical Provider, MD  ezetimibe (ZETIA) 10 MG tablet Take 1 tablet (10 mg total) by mouth daily. 02/28/14   Troy Sine, MD   BP 196/86  Pulse 57  Temp(Src) 97.6 F (36.4 C) (Oral)  Resp 12  Wt 125 lb (56.7 kg)  SpO2 98%  Vital signs normal except bradycardia  Physical Exam  Nursing note and vitals reviewed. Constitutional: She is oriented to person, place, and time. She appears well-developed and well-nourished.  Non-toxic appearance. She does not appear ill. No distress.  HENT:  Head: Normocephalic and atraumatic.  Right Ear: External ear  normal.  Left Ear: External ear normal.  Nose: Nose normal. No mucosal edema or rhinorrhea.  Mouth/Throat: Oropharynx is clear and moist and mucous membranes are normal. No dental abscesses or uvula swelling.  Eyes: Conjunctivae and EOM are normal. Pupils are equal, round, and reactive to light.  Neck: Normal range of motion and full passive range of motion without pain. Neck supple.  Large round nontender swelling in her left anterior cervical neck  Cardiovascular: Normal rate, regular rhythm and normal heart sounds.  Exam reveals no gallop and no friction rub.  No murmur heard. Pulmonary/Chest: Effort normal and breath sounds normal. No respiratory distress. She has no wheezes. She has no rhonchi. She has no rales. She exhibits no tenderness and no crepitus.  Abdominal: Soft. Normal appearance and bowel sounds are normal. She exhibits no distension. There is no tenderness. There is no rebound and no guarding.  Musculoskeletal: Normal range of motion. She exhibits no edema and no tenderness.  Patient has surgical scar her right shoulder. She has tenderness over her lateral right clavicle without deformity, swelling, or bruising. Although she states her shoulder hurts she is able to abduct her shoulder to 45 without pain.  Neurological: She is alert and oriented to person, place, and time. She has normal strength. No cranial nerve deficit.  Skin: Skin is warm, dry and intact. No rash noted. No erythema. No pallor.  Psychiatric: She has a normal mood and affect. Her speech is normal and behavior is normal. Her mood appears not anxious.    ED Course  Procedures (including critical care time)  Medications  oxymetazoline (AFRIN) 0.05 % nasal spray 2 spray (not administered)  HYDROcodone-acetaminophen (NORCO/VICODIN) 5-325 MG per tablet 2 tablet (2 tablets Oral Given 08/23/14 1322)  ondansetron (ZOFRAN) injection 4 mg (4 mg Intravenous Given 08/23/14 1336)    Pt and daughter given test results.    15:06 Tennis Must, will have someone come see patient.   Labs Review Results for orders placed during the hospital encounter of 08/23/14  CBC WITH DIFFERENTIAL      Result Value Ref Range   WBC 13.3 (*) 4.0 - 10.5 K/uL   RBC 4.65  3.87 - 5.11 MIL/uL   Hemoglobin 14.4  12.0 - 15.0 g/dL   HCT 43.0  36.0 - 46.0 %   MCV 92.5  78.0 - 100.0 fL   MCH 31.0  26.0 - 34.0 pg   MCHC 33.5  30.0 - 36.0 g/dL   RDW 12.9  11.5 - 15.5 %   Platelets 172  150 - 400 K/uL   Neutrophils Relative % 82 (*) 43 - 77 %   Neutro Abs 11.0 (*) 1.7 - 7.7 K/uL   Lymphocytes Relative 11 (*) 12 - 46 %   Lymphs Abs 1.4  0.7 - 4.0 K/uL   Monocytes Relative 6  3 - 12 %   Monocytes Absolute 0.8  0.1 - 1.0 K/uL   Eosinophils Relative 1  0 - 5 %   Eosinophils Absolute 0.1  0.0 - 0.7 K/uL   Basophils Relative 0  0 - 1 %   Basophils Absolute 0.0  0.0 - 0.1 K/uL  COMPREHENSIVE METABOLIC PANEL      Result Value Ref Range   Sodium 141  137 - 147 mEq/L   Potassium 4.3  3.7 - 5.3 mEq/L   Chloride 104  96 - 112 mEq/L   CO2 25  19 - 32 mEq/L   Glucose, Bld 116 (*) 70 - 99 mg/dL   BUN 13  6 - 23 mg/dL   Creatinine, Ser 0.95  0.50 - 1.10 mg/dL   Calcium 9.7  8.4 - 10.5 mg/dL   Total Protein 7.5  6.0 - 8.3 g/dL   Albumin 4.0  3.5 - 5.2 g/dL   AST 28  0 - 37 U/L   ALT 14  0 - 35 U/L   Alkaline Phosphatase 45  39 - 117 U/L   Total Bilirubin 0.9  0.3 - 1.2 mg/dL   GFR calc non Af Amer 53 (*) >90  mL/min   GFR calc Af Amer 61 (*) >90 mL/min   Anion gap 12  5 - 15  I-STAT TROPOININ, ED      Result Value Ref Range   Troponin i, poc 0.00  0.00 - 0.08 ng/mL   Comment 3           CBG MONITORING, ED      Result Value Ref Range   Glucose-Capillary 102 (*) 70 - 99 mg/dL    Laboratory interpretation all normal except leukocytosis    Imaging Review Dg Chest Portable 1 View  08/23/2014   CLINICAL DATA:  Initial encounter for loss of consciousness today with right shoulder pain.  EXAM: PORTABLE CHEST - 1 VIEW   COMPARISON:  01/24/2013  FINDINGS: 1318 hrs. Lower face obscures lung apices. Hyper expansion is consistent with emphysema. The lungs are clear without focal infiltrate, edema, pneumothorax or pleural effusion. The cardiopericardial silhouette is within normal limits for size. Imaged bony structures of the thorax are intact. Patient is status post right shoulder replacement. Telemetry leads overlie the chest.  IMPRESSION: Emphysema without acute cardiopulmonary process.   Electronically Signed   By: Misty Stanley M.D.   On: 08/23/2014 13:47   Dg Shoulder Right Port  08/23/2014   CLINICAL DATA:  Pain post trauma; total shoulder replacement on the right  EXAM: PORTABLE RIGHT SHOULDER - 2+ VIEW  COMPARISON:  January 26, 2013  FINDINGS: Internal rotation, external rotation, and Y scapular images were obtained. Patient is status post reverse right shoulder arthroplasty. Prosthetic components appear well seated. No acute fracture or dislocation. There is bony overgrowth along the lateral olecranon inferiorly. No erosive change.  IMPRESSION: Prosthetic components appear well seated. No fracture or dislocation.   Electronically Signed   By: Lowella Grip M.D.   On: 08/23/2014 13:46     EKG Interpretation   Date/Time:  Friday August 23 2014 12:42:46 EDT Ventricular Rate:  56 PR Interval:  248 QRS Duration: 86 QT Interval:  468 QTC Calculation: 451 R Axis:   23 Text Interpretation:  Sinus bradycardia with 1st degree A-V block ST \\T \ T  wave abnormality, consider lateral ischemia Electrode noise Baseline  wander No significant change since last tracing 19 Oct 2011 Confirmed by  Davi Kroon  MD-I, Keiyon Plack ([id number]) on 08/23/2014 12:52:25 PM      MDM   Final diagnoses:  Syncope, unspecified syncope type  Contusion shoulder/arm, right, initial encounter    Disposition pending cardiology consult.   Rolland Porter, MD, Abram Sander     Janice Norrie, MD 08/23/14 [phone]

## 2014-08-23 NOTE — H&P (Signed)
Patient ID: Julie Bass MRN: [id number], DOB/AGE: 03/17/1926   Admit date: 08/23/2014   Primary Physician: [name], MD Primary Cardiologist: Dr. Claiborne Billings  Pt. Profile:  80 year old African American female with past medical history significant for hypertension, hyperlipidemia, peripheral vascular disease status post left SFA stent, and history of nonsustained VT presented with syncope  Problem List  Past Medical History  Diagnosis Date  . Glaucoma   . Osteoporosis   . Hyperlipidemia   . Hyperlipemia 10/18/2011  . PAD (peripheral artery disease) 10/18/2011    h/o left SFA stent  . Hypertension     SEES DR. KELLY   . HTN (hypertension) 10/18/2011  . NSVT (nonsustained ventricular tachycardia) 10/18/2011  . Depression   . Chronic kidney disease     KIDNEY STONES  . Hiatal hernia   . Arthritis   . Dementia     BEGINNING STAGES   . GERD (gastroesophageal reflux disease)     Past Surgical History  Procedure Laterality Date  . Cholecystectomy    . Abdominal hysterectomy  1973  . Cardiac catheterization      2012  . Reverse shoulder arthroplasty Right 01/26/2013    Procedure: RIGHT REVERSE TOTAL SHOULDER ARTHROPLASTY;  Surgeon: Augustin Schooling, MD;  Location: [facility];  Service: Orthopedics;  Laterality: Right;  . Transthoracic echocardiogram  10/2012    EF 75-70%, mod conc hypertrophy, severely calcified MV annulus, LA mildly dailted, PA peak pressure 13mmHg  . Nm myocar perf wall motion  10/2011    non-gated - normal stuy, EF 82%, normal LV wall motion  . Lower extremity arterial doppler  2013    right SFA with 50-69% diameter reduction, right PTA/peroneal occluded, L SFA prox to stent has narrowing with increased velocities >60% diameter reduction, L SFA stent patent, L ATA w/occlusive disease, bilat ABIs show mild arterial insuffiency at rest  . Lower extremity angiogram  11/02/2007    stent of mid left SFA with 6x182mm EV3 self-expanding stent and 6x3 in prox  region (Dr. Adora Fridge)  . Lower extremity angiogram       Allergies  Allergies  Allergen Reactions  . Crestor [Rosuvastatin Calcium]     Muscle pain  . Lipitor [Atorvastatin Calcium]     Muscle pain    HPI  The patient is an 80 year old African American female with past medical history significant for hypertension, hyperlipidemia, peripheral vascular disease status post left SFA stent, and history of nonsustained VT. According to the patient's daughter who is also Dr. Evette Georges assistant, patient began to have syncope episode since 2012. The stress test was obtained on 10/19/2011 which showed EF 82%, no evidence of myocardial ischemia or infarction. Her last echocardiogram obtained on 11/10/2012 showed EF of 75-80%, dynamic obstruction in mid LV cavity with peak velocity was 160, peak gradient 10 mmHg, grade 1 diastolic dysfunction, severely calcified mitral annulus, peak PA pressure 33 mm mercury.   She subsequently had another episode of fall. A monitor was placed on her on 10/10/2012 which noted she has been having nonsustained VT. Patient has been placed on high-dose metoprolol 100 mg in the morning and 75 mg at night to help control the symptom. According to the daughter, she been doing well at home and has been largely independent. She denies ever had any chest pain or dizziness. She states her passing out spell tend to occur suddenly and without warning. Her daughter states patient lost consciousness during her 2012 episode, however during her second episode in  2013, she did not completely lose consciousness. She was recently seen by Dr. Claiborne Billings in the clinic on 05/08/2014 at which time she was doing well. Amlodipine was added to her medical regimen to better control blood pressure. Echocardiogram was obtained on 05/15/2014 which showed EF 93-23%, grade 1 diastolic dysfunction, moderately calcified mitral annulus. Given her claudication symptoms, lower extremity arterial Doppler was also obtained on  7/1 which showed previous SFA stent, no significant change when compared to the previous lower extremity arterial Doppler in 2013. Since last visit, patient denies any significant fever, chill, cough, lower extremity edema paroxysmal nocturnal dyspnea.  Patient went to the grocery stores the morning of 08/23/2014, while at the grocery store, patient has been feeling okay. She came home and was in her driveway when she passed out. When she came to, she was leaning on her right side. Since then, she has been having some right shoulder pain. Patient denies any loss of bladder or bowel control. She has some nausea after waking up, however denies any significant dizziness, chest pain and shortness breath. Upon presenting to [facility] ED, she was noted to be hypertensive with blood pressure of 170/58. Pulse 58. Laboratory finding include creatinine of 0.95, initial troponin negative, white blood cell count of 13.3, urinalysis negative for  nitrite. Chest x-ray was negative for acute process. Right shoulder x-ray was negative for fracture. EKG showed normal sinus rhythm, down scooped ST segment of lateral leads, first degree heart block. Telemetry showed a single 15 beats run of NSVT. Cardiology has been consulted for syncope.   Home Medications  Prior to Admission medications   Medication Sig Start Date End Date Taking? Authorizing Provider  amLODipine (NORVASC) 5 MG tablet Take 1 tablet (5 mg total) by mouth daily. 05/08/14  Yes Troy Sine, MD  aspirin EC 81 MG tablet Take 81 mg by mouth daily.   Yes Historical Provider, MD  cilostazol (PLETAL) 50 MG tablet Take 50 mg by mouth 2 (two) times daily.   Yes Historical Provider, MD  citalopram (CELEXA) 10 MG tablet Take 1 tablet (10 mg total) by mouth daily. 02/28/14  Yes Troy Sine, MD  clopidogrel (PLAVIX) 75 MG tablet Take 1 tablet (75 mg total) by mouth daily. 01/21/14  Yes Troy Sine, MD  losartan (COZAAR) 100 MG tablet Take 1 tablet (100 mg total)  by mouth daily. 01/21/14  Yes Troy Sine, MD  metoprolol (LOPRESSOR) 50 MG tablet Take 1.5-2 tablets (75-100 mg total) by mouth 2 (two) times daily. Take 100mg  in the morning and 75mg  in the evening 11/26/13  Yes Troy Sine, MD  omega-3 acid ethyl esters (LOVAZA) 1 G capsule Take 1 capsule (1 g total) by mouth daily. 01/21/14  Yes Troy Sine, MD  pantoprazole (PROTONIX) 40 MG tablet Take 1 tablet (40 mg total) by mouth daily. 01/03/14  Yes Troy Sine, MD  Pitavastatin Calcium (LIVALO) 2 MG TABS Take 1 tablet (2 mg total) by mouth daily. 02/28/14  Yes Troy Sine, MD  timolol (TIMOPTIC) 0.5 % ophthalmic solution Place 1 drop into both eyes 2 (two) times daily.    Yes Historical Provider, MD  ezetimibe (ZETIA) 10 MG tablet Take 1 tablet (10 mg total) by mouth daily. 02/28/14   Troy Sine, MD    Family History  Family History  Problem Relation Age of Onset  . Breast cancer Sister     cervical cancer  . Colon cancer Brother  throat cancer  . Throat cancer Father   . Cancer Mother   . CAD Son   . Pulmonary embolism Daughter   . Hypertension Daughter     Social History  History   Social History  . Marital Status: Widowed    Spouse Name: N/A    Number of Children: 2  . Years of Education: N/A   Occupational History  . Not on file.   Social History Main Topics  . Smoking status: Former Smoker    Quit date: 05/07/1984  . Smokeless tobacco: Never Used  . Alcohol Use: No  . Drug Use: No  . Sexual Activity: Not Currently   Other Topics Concern  . Not on file   Social History Narrative  . No narrative on file     Review of Systems General:  No chills, fever, night sweats or weight changes.  Cardiovascular:  No chest pain, dyspnea on exertion, edema, orthopnea, palpitations, paroxysmal nocturnal dyspnea. Dermatological: No rash, lesions/masses Respiratory: No cough, dyspnea Urologic: No hematuria, dysuria Abdominal:   No nausea, vomiting, diarrhea, bright  red blood per rectum, melena, or hematemesis Neurologic:  No visual changes, wkns. Passed out today All other systems reviewed and are otherwise negative except as noted above.  Physical Exam  Blood pressure 196/86, pulse 57, temperature 97.6 F (36.4 C), temperature source Oral, resp. rate 12, weight 125 lb (56.7 kg), SpO2 98.00%.  General: Pleasant, NAD Psych: Normal affect. Neuro: Alert and oriented X 3. Moves all extremities spontaneously. HEENT: Normal  Neck: Supple without bruits or JVD. Lungs:  Resp regular and unlabored, CTA. Heart: RRR no s3, s4, or murmurs. Abdomen: Soft, non-tender, non-distended, BS + x 4.  Extremities: No clubbing, cyanosis or edema. DP/PT/Radials 2+ and equal bilaterally.  Labs  Troponin [facility] of Care Test)  Recent Labs  08/23/14 1339  TROPIPOC 0.00   No results found for this basename: CKTOTAL, CKMB, TROPONINI,  in the last 72 hours Lab Results  Component Value Date   WBC 13.3* 08/23/2014   HGB 14.4 08/23/2014   HCT 43.0 08/23/2014   MCV 92.5 08/23/2014   PLT 172 08/23/2014    Recent Labs Lab 08/23/14 1315  NA 141  K 4.3  CL 104  CO2 25  BUN 13  CREATININE 0.95  CALCIUM 9.7  PROT 7.5  BILITOT 0.9  ALKPHOS 45  ALT 14  AST 28  GLUCOSE 116*   Lab Results  Component Value Date   CHOL 151 05/15/2014   HDL 73 05/15/2014   LDLCALC 58 05/15/2014   TRIG 100 05/15/2014   No results found for this basename: DDIMER     Radiology/Studies  Dg Chest Portable 1 View  08/23/2014   CLINICAL DATA:  Initial encounter for loss of consciousness today with right shoulder pain.  EXAM: PORTABLE CHEST - 1 VIEW  COMPARISON:  01/24/2013  FINDINGS: 1318 hrs. Lower face obscures lung apices. Hyper expansion is consistent with emphysema. The lungs are clear without focal infiltrate, edema, pneumothorax or pleural effusion. The cardiopericardial silhouette is within normal limits for size. Imaged bony structures of the thorax are intact. Patient is status post  right shoulder replacement. Telemetry leads overlie the chest.  IMPRESSION: Emphysema without acute cardiopulmonary process.   Electronically Signed   By: Misty Stanley M.D.   On: 08/23/2014 13:47   Dg Shoulder Right Port  08/23/2014   CLINICAL DATA:  Pain post trauma; total shoulder replacement on the right  EXAM: PORTABLE RIGHT SHOULDER - 2+  VIEW  COMPARISON:  January 26, 2013  FINDINGS: Internal rotation, external rotation, and Y scapular images were obtained. Patient is status post reverse right shoulder arthroplasty. Prosthetic components appear well seated. No acute fracture or dislocation. There is bony overgrowth along the lateral olecranon inferiorly. No erosive change.  IMPRESSION: Prosthetic components appear well seated. No fracture or dislocation.   Electronically Signed   By: Lowella Grip M.D.   On: 08/23/2014 13:46    ECG  EKG showed normal sinus rhythm, down scooped ST segment of lateral leads, first degree heart block  Echocardiogram 05/15/2014  LV EF: 65% - 70%  ------------------------------------------------------------------- Indications: Hypertension - benign without CHF 402.10.  ------------------------------------------------------------------- History: Risk factors: Ventricular Tachycardia, Peripheral Arterial Disease, GERD. Former tobacco use. Hypertension. Dyslipidemia.  ------------------------------------------------------------------- Study Conclusions  - Left ventricle: The cavity size was normal. Systolic function was vigorous. The estimated ejection fraction was in the range of 65% to 70%. Wall motion was normal; there were no regional wall motion abnormalities. Doppler parameters are consistent with abnormal left ventricular relaxation (grade 1 diastolic dysfunction). - Mitral valve: Moderately calcified annulus.      ASSESSMENT AND PLAN  1. Syncope  - unclear if related to NSVT  - admit to telemetry, trend trop. Low suspicion for ACS without  CP.   - if has recurrent symptom associated with NSVT, may consider antiarrythmic medication vs EP consult. Continue BB unless significant bradycardia or junctional rhythm  2. Hypertension 3. Hyperlipidemia 4. peripheral vascular disease status post left SFA stent  - stable on last LE arterial doppler on 05/15/2014 5. R shoulder pain: no fracture on shoulder x ray  SignedAlmyra Deforest, PA-C 08/23/2014, 3:55 PM As above, patient seen and examined. Briefly she is an 80 year old female with past medical history of hypertension, hyperlipidemia, peripheral vascular disease for evaluation of syncope. Nuclear study in December 2012 showed an ejection fraction of 82%. Normal perfusion. Previous monitor in November of 2013 showed sinus rhythm, first degree AV block and nonsustained ventricular tachycardia. No associated symptoms. Most recent echocardiogram was performed in July of 2015. LV function was normal. There was grade 1 diastolic dysfunction. Patient had 2 previous episodes of syncope. One occurred while standing at the sink while washing dishes. A second occurred after she was walking out of her bathroom. She had a third episode today. She went to the store and upon returning home she was carrying her groceries into the house. She didn't have frank syncope. There was no warning. No preceding palpitations, dyspnea, chest pain, nausea or diaphoresis. There was no incontinence or tongue biting. She thinks she was unconscious for approximately 5 minutes. She did not have any injuries other than she does have residual shoulder soreness. Electrocardiogram shows sinus rhythm, first degree A-V block, lateral T wave inversion unchanged compared to previous. Syncope of uncertain etiology. Patient had a recent echocardiogram showing normal LV function. No preceding symptoms of chest pain, palpitations or dyspnea. Symptoms not clearly orthostatic. Will admit to telemetry. Watch overnight on monitor. Check orthostatic  vitals tomorrow morning. Cycle enzymes although doubt ischemia. Follow blood pressure on present medical regimen. If above negative patient can most likely be discharged tomorrow morning. I would favor an outpatient implantable loop monitor. I also plan to repeat her nuclear study. She will then followup with Dr. Claiborne Billings. Patient has been instructed not to drive for 6 months. Julie Bass

## 2014-08-23 NOTE — ED Notes (Signed)
Pt was reportedly getting groceries out of the car this AM when had a syncopal episode. Unknown LOC. Pt fell on right shoulder, where she is having pain. Prev. Shoulder surgery on same same. Pt with hx of SVT. Pt awake, alert, oriented x3, hx of dementia.

## 2014-08-23 NOTE — ED Notes (Signed)
Radiology at bedside for portable films.

## 2014-08-24 DIAGNOSIS — E785 Hyperlipidemia, unspecified: Secondary | ICD-10-CM

## 2014-08-24 DIAGNOSIS — I472 Ventricular tachycardia: Secondary | ICD-10-CM

## 2014-08-24 DIAGNOSIS — I739 Peripheral vascular disease, unspecified: Secondary | ICD-10-CM

## 2014-08-24 DIAGNOSIS — I10 Essential (primary) hypertension: Secondary | ICD-10-CM

## 2014-08-24 LAB — TROPONIN I
Troponin I: <0.3 ng/mL (ref <0.30)
Troponin I: <0.3 ng/mL (ref <0.30)

## 2014-08-24 LAB — BASIC METABOLIC PANEL
Anion gap: 11 (ref 5–15)
BUN: 13 mg/dL (ref 6–23)
CO2: 25 mEq/L (ref 19–32)
Calcium: 9.9 mg/dL (ref 8.4–10.5)
Chloride: 104 mEq/L (ref 96–112)
Creatinine, Ser: 1.03 mg/dL (ref 0.50–1.10)
GFR calc Af Amer: 55 mL/min — ABNORMAL LOW (ref >90)
GFR calc non Af Amer: 48 mL/min — ABNORMAL LOW (ref >90)
Glucose, Bld: 139 mg/dL — ABNORMAL HIGH (ref 70–99)
Potassium: 4 mEq/L (ref 3.7–5.3)
Sodium: 140 mEq/L (ref 137–147)

## 2014-08-24 LAB — LIPID PANEL
Cholesterol: 215 mg/dL — ABNORMAL HIGH (ref 0–200)
HDL: 82 mg/dL (ref >39)
LDL Cholesterol: 113 mg/dL — ABNORMAL HIGH (ref 0–99)
Total CHOL/HDL Ratio: 2.6 RATIO
Triglycerides: 99 mg/dL (ref <150)
VLDL: 20 mg/dL (ref 0–40)

## 2014-08-24 MED ORDER — INFLUENZA VAC SPLIT QUAD 0.5 ML IM SUSY
0.5000 mL | PREFILLED_SYRINGE | INTRAMUSCULAR | Status: AC
  Administered 2014-08-25: 0.5 mL via INTRAMUSCULAR
  Filled 2014-08-24: qty 0.5

## 2014-08-24 MED ORDER — PNEUMOCOCCAL VAC POLYVALENT 25 MCG/0.5ML IJ INJ
0.5000 mL | INJECTION | INTRAMUSCULAR | Status: AC
  Administered 2014-08-25: 0.5 mL via INTRAMUSCULAR
  Filled 2014-08-24: qty 0.5

## 2014-08-24 MED ORDER — SODIUM CHLORIDE 0.9 % IV SOLN
INTRAVENOUS | Status: DC
  Administered 2014-08-24 – 2014-08-25 (×2): via INTRAVENOUS

## 2014-08-24 MED ORDER — LOSARTAN POTASSIUM 50 MG PO TABS
50.0000 mg | ORAL_TABLET | Freq: Every day | ORAL | Status: DC
  Administered 2014-08-25 – 2014-08-27 (×3): 50 mg via ORAL
  Filled 2014-08-24 (×3): qty 1

## 2014-08-24 MED ORDER — AMLODIPINE BESYLATE 2.5 MG PO TABS
2.5000 mg | ORAL_TABLET | Freq: Every day | ORAL | Status: DC
  Administered 2014-08-25 – 2014-08-27 (×3): 2.5 mg via ORAL
  Filled 2014-08-24 (×3): qty 1

## 2014-08-24 NOTE — Progress Notes (Signed)
Note orthostatic VS positive Will begin IV fluids  Cult back on BP meds Check orhtostatics q shift.

## 2014-08-24 NOTE — Progress Notes (Signed)
Subjective: Denies CP or SOB  Just feels very weak like can't stand up  Has not been out of bed   Objective: Filed Vitals:   08/23/14 1948 08/24/14 0000 08/24/14 0400 08/24/14 0605  BP: 179/62   121/58  Pulse: 60   65  Temp: 98.3 F (36.8 C)   97.9 F (36.6 C)  TempSrc: Oral   Oral  Resp:  16 19   Weight: 119 lb 4.3 oz (54.1 kg)   120 lb 2.4 oz (54.5 kg)  SpO2: 98%   95%   Weight change:   Intake/Output Summary (Last 24 hours) at 08/24/14 0941 Last data filed at 08/24/14 0900  Gross per 24 hour  Intake    400 ml  Output      0 ml  Net    400 ml    General: Alert, awake, oriented x3, in no acute distress Neck:  JVP is normal Heart: Regular rate and rhythm, without murmurs, rubs, gallops.  Lungs: Clear to auscultation.  No rales or wheezes. Exemities:  No edema.   Neuro: Grossly intact, nonfocal.  Tele:  SR  4 beat NSVT  Lab Results: Results for orders placed during the hospital encounter of 08/23/14 (from the past 24 hour(s))  CBG MONITORING, ED     Status: Abnormal   Collection Time    08/23/14 12:55 PM      Result Value Ref Range   Glucose-Capillary 102 (*) 70 - 99 mg/dL  CBC WITH DIFFERENTIAL     Status: Abnormal   Collection Time    08/23/14  1:15 PM      Result Value Ref Range   WBC 13.3 (*) 4.0 - 10.5 K/uL   RBC 4.65  3.87 - 5.11 MIL/uL   Hemoglobin 14.4  12.0 - 15.0 g/dL   HCT 43.0  36.0 - 46.0 %   MCV 92.5  78.0 - 100.0 fL   MCH 31.0  26.0 - 34.0 pg   MCHC 33.5  30.0 - 36.0 g/dL   RDW 12.9  11.5 - 15.5 %   Platelets 172  150 - 400 K/uL   Neutrophils Relative % 82 (*) 43 - 77 %   Neutro Abs 11.0 (*) 1.7 - 7.7 K/uL   Lymphocytes Relative 11 (*) 12 - 46 %   Lymphs Abs 1.4  0.7 - 4.0 K/uL   Monocytes Relative 6  3 - 12 %   Monocytes Absolute 0.8  0.1 - 1.0 K/uL   Eosinophils Relative 1  0 - 5 %   Eosinophils Absolute 0.1  0.0 - 0.7 K/uL   Basophils Relative 0  0 - 1 %   Basophils Absolute 0.0  0.0 - 0.1 K/uL  COMPREHENSIVE METABOLIC PANEL      Status: Abnormal   Collection Time    08/23/14  1:15 PM      Result Value Ref Range   Sodium 141  137 - 147 mEq/L   Potassium 4.3  3.7 - 5.3 mEq/L   Chloride 104  96 - 112 mEq/L   CO2 25  19 - 32 mEq/L   Glucose, Bld 116 (*) 70 - 99 mg/dL   BUN 13  6 - 23 mg/dL   Creatinine, Ser 0.95  0.50 - 1.10 mg/dL   Calcium 9.7  8.4 - 10.5 mg/dL   Total Protein 7.5  6.0 - 8.3 g/dL   Albumin 4.0  3.5 - 5.2 g/dL   AST 28  0 - 37 U/L  ALT 14  0 - 35 U/L   Alkaline Phosphatase 45  39 - 117 U/L   Total Bilirubin 0.9  0.3 - 1.2 mg/dL   GFR calc non Af Amer 53 (*) >90 mL/min   GFR calc Af Amer 61 (*) >90 mL/min   Anion gap 12  5 - 15  I-STAT TROPOININ, ED     Status: None   Collection Time    08/23/14  1:39 PM      Result Value Ref Range   Troponin i, poc 0.00  0.00 - 0.08 ng/mL   Comment 3           URINALYSIS, ROUTINE W REFLEX MICROSCOPIC     Status: None   Collection Time    08/23/14  2:56 PM      Result Value Ref Range   Color, Urine YELLOW  YELLOW   APPearance CLEAR  CLEAR   Specific Gravity, Urine 1.009  1.005 - 1.030   pH 7.5  5.0 - 8.0   Glucose, UA NEGATIVE  NEGATIVE mg/dL   Hgb urine dipstick NEGATIVE  NEGATIVE   Bilirubin Urine NEGATIVE  NEGATIVE   Ketones, ur NEGATIVE  NEGATIVE mg/dL   Protein, ur NEGATIVE  NEGATIVE mg/dL   Urobilinogen, UA 0.2  0.0 - 1.0 mg/dL   Nitrite NEGATIVE  NEGATIVE   Leukocytes, UA NEGATIVE  NEGATIVE  TROPONIN I     Status: None   Collection Time    08/23/14  8:23 PM      Result Value Ref Range   Troponin I <0.30  <0.30 ng/mL  TROPONIN I     Status: None   Collection Time    08/24/14  1:20 AM      Result Value Ref Range   Troponin I <0.30  <0.30 ng/mL  LIPID PANEL     Status: Abnormal   Collection Time    08/24/14  1:20 AM      Result Value Ref Range   Cholesterol 215 (*) 0 - 200 mg/dL   Triglycerides 99  <150 mg/dL   HDL 82  >39 mg/dL   Total CHOL/HDL Ratio 2.6     VLDL 20  0 - 40 mg/dL   LDL Cholesterol 113 (*) 0 - 99 mg/dL  TROPONIN  I     Status: None   Collection Time    08/24/14  7:20 AM      Result Value Ref Range   Troponin I <0.30  <0.30 ng/mL  BASIC METABOLIC PANEL     Status: Abnormal   Collection Time    08/24/14  7:20 AM      Result Value Ref Range   Sodium 140  137 - 147 mEq/L   Potassium 4.0  3.7 - 5.3 mEq/L   Chloride 104  96 - 112 mEq/L   CO2 25  19 - 32 mEq/L   Glucose, Bld 139 (*) 70 - 99 mg/dL   BUN 13  6 - 23 mg/dL   Creatinine, Ser 1.03  0.50 - 1.10 mg/dL   Calcium 9.9  8.4 - 10.5 mg/dL   GFR calc non Af Amer 48 (*) >90 mL/min   GFR calc Af Amer 55 (*) >90 mL/min   Anion gap 11  5 - 15    Studies/Results: Dg Chest Portable 1 View  08/23/2014   CLINICAL DATA:  Initial encounter for loss of consciousness today with right shoulder pain.  EXAM: PORTABLE CHEST - 1 VIEW  COMPARISON:  01/24/2013  FINDINGS: 1318 hrs. Lower face obscures lung apices. Hyper expansion is consistent with emphysema. The lungs are clear without focal infiltrate, edema, pneumothorax or pleural effusion. The cardiopericardial silhouette is within normal limits for size. Imaged bony structures of the thorax are intact. Patient is status post right shoulder replacement. Telemetry leads overlie the chest.  IMPRESSION: Emphysema without acute cardiopulmonary process.   Electronically Signed   By: Misty Stanley M.D.   On: 08/23/2014 13:47   Dg Shoulder Right Port  08/23/2014   CLINICAL DATA:  Pain post trauma; total shoulder replacement on the right  EXAM: PORTABLE RIGHT SHOULDER - 2+ VIEW  COMPARISON:  January 26, 2013  FINDINGS: Internal rotation, external rotation, and Y scapular images were obtained. Patient is status post reverse right shoulder arthroplasty. Prosthetic components appear well seated. No acute fracture or dislocation. There is bony overgrowth along the lateral olecranon inferiorly. No erosive change.  IMPRESSION: Prosthetic components appear well seated. No fracture or dislocation.   Electronically Signed   By: Lowella Grip M.D.   On: 08/23/2014 13:46    Medications:  Reviewed   @PROBHOSP @  1.  SYncope  Not clear what caused spell yesterdsay  She did say before spells her legs feel weak Currently feels weak just lying in bed   Will get orthostatics   Continue tele.   Would get noncontrast head CT and carotid dopplers if orthostatics negative.    2.  HTN  Folow  3.  PVOD  Medical Rx  4.  HL ON Zetia and Livalo at home  WIll resume statin at d/c     LOS: 1 day   Dorris Carnes 08/24/2014, 9:41 AM

## 2014-08-24 NOTE — Progress Notes (Signed)
Utilization Review completed.  

## 2014-08-25 DIAGNOSIS — K219 Gastro-esophageal reflux disease without esophagitis: Secondary | ICD-10-CM | POA: Diagnosis not present

## 2014-08-25 DIAGNOSIS — F0390 Unspecified dementia without behavioral disturbance: Secondary | ICD-10-CM | POA: Diagnosis not present

## 2014-08-25 DIAGNOSIS — I129 Hypertensive chronic kidney disease with stage 1 through stage 4 chronic kidney disease, or unspecified chronic kidney disease: Secondary | ICD-10-CM | POA: Diagnosis not present

## 2014-08-25 DIAGNOSIS — M1990 Unspecified osteoarthritis, unspecified site: Secondary | ICD-10-CM | POA: Diagnosis not present

## 2014-08-25 DIAGNOSIS — Z803 Family history of malignant neoplasm of breast: Secondary | ICD-10-CM | POA: Diagnosis not present

## 2014-08-25 DIAGNOSIS — Z8249 Family history of ischemic heart disease and other diseases of the circulatory system: Secondary | ICD-10-CM | POA: Diagnosis not present

## 2014-08-25 DIAGNOSIS — F329 Major depressive disorder, single episode, unspecified: Secondary | ICD-10-CM | POA: Diagnosis not present

## 2014-08-25 DIAGNOSIS — N189 Chronic kidney disease, unspecified: Secondary | ICD-10-CM | POA: Diagnosis not present

## 2014-08-25 DIAGNOSIS — Z7902 Long term (current) use of antithrombotics/antiplatelets: Secondary | ICD-10-CM | POA: Diagnosis not present

## 2014-08-25 DIAGNOSIS — Z889 Allergy status to unspecified drugs, medicaments and biological substances status: Secondary | ICD-10-CM | POA: Diagnosis not present

## 2014-08-25 DIAGNOSIS — I951 Orthostatic hypotension: Secondary | ICD-10-CM | POA: Diagnosis not present

## 2014-08-25 DIAGNOSIS — I471 Supraventricular tachycardia: Secondary | ICD-10-CM | POA: Diagnosis not present

## 2014-08-25 DIAGNOSIS — I739 Peripheral vascular disease, unspecified: Secondary | ICD-10-CM | POA: Diagnosis not present

## 2014-08-25 DIAGNOSIS — E785 Hyperlipidemia, unspecified: Secondary | ICD-10-CM | POA: Diagnosis not present

## 2014-08-25 DIAGNOSIS — H409 Unspecified glaucoma: Secondary | ICD-10-CM | POA: Diagnosis not present

## 2014-08-25 DIAGNOSIS — Z808 Family history of malignant neoplasm of other organs or systems: Secondary | ICD-10-CM | POA: Diagnosis not present

## 2014-08-25 DIAGNOSIS — Z87891 Personal history of nicotine dependence: Secondary | ICD-10-CM | POA: Diagnosis not present

## 2014-08-25 DIAGNOSIS — R109 Unspecified abdominal pain: Secondary | ICD-10-CM | POA: Diagnosis not present

## 2014-08-25 DIAGNOSIS — Z7982 Long term (current) use of aspirin: Secondary | ICD-10-CM | POA: Diagnosis not present

## 2014-08-25 DIAGNOSIS — Z96611 Presence of right artificial shoulder joint: Secondary | ICD-10-CM | POA: Diagnosis not present

## 2014-08-25 DIAGNOSIS — R040 Epistaxis: Secondary | ICD-10-CM | POA: Diagnosis not present

## 2014-08-25 DIAGNOSIS — W1830XA Fall on same level, unspecified, initial encounter: Secondary | ICD-10-CM | POA: Diagnosis not present

## 2014-08-25 DIAGNOSIS — I440 Atrioventricular block, first degree: Secondary | ICD-10-CM | POA: Diagnosis not present

## 2014-08-25 DIAGNOSIS — R55 Syncope and collapse: Secondary | ICD-10-CM | POA: Diagnosis present

## 2014-08-25 DIAGNOSIS — Z8049 Family history of malignant neoplasm of other genital organs: Secondary | ICD-10-CM | POA: Diagnosis not present

## 2014-08-25 DIAGNOSIS — M810 Age-related osteoporosis without current pathological fracture: Secondary | ICD-10-CM | POA: Diagnosis not present

## 2014-08-25 DIAGNOSIS — S40011A Contusion of right shoulder, initial encounter: Secondary | ICD-10-CM | POA: Diagnosis not present

## 2014-08-25 DIAGNOSIS — Z23 Encounter for immunization: Secondary | ICD-10-CM | POA: Diagnosis not present

## 2014-08-25 DIAGNOSIS — Z800 Family history of malignant neoplasm of digestive organs: Secondary | ICD-10-CM | POA: Diagnosis not present

## 2014-08-25 LAB — MAGNESIUM: Magnesium: 1.8 mg/dL (ref 1.5–2.5)

## 2014-08-25 NOTE — Progress Notes (Signed)
At 0522 pt had 7 beats of WQRS/ Vtach. Pt asymptomatic. Pt had similar in the ED. Cont to monitor.

## 2014-08-25 NOTE — Progress Notes (Signed)
At 2234 Pt had small run of SVT. Pt asymptomatic. Cont to monitor.

## 2014-08-25 NOTE — Progress Notes (Signed)
Consulting cardiologist: Dorris Carnes MD Primary Cardiologist: Shelva Majestic MD  Subjective:    Orthostatics are negative. Still feels weak and tired. Not ambulating well.   Objective:   Temp:  [99.3 F (37.4 C)-100.1 F (37.8 C)] 99.5 F (37.5 C) (10/11 0514) Pulse Rate:  [62-69] 69 (10/11 0514) Resp:  [16-18] 18 (10/11 0514) BP: (116-176)/(49-64) 176/64 mmHg (10/11 0514) SpO2:  [97 %-98 %] 98 % (10/11 0514) Weight:  [121 lb 4.1 oz (55 kg)] 121 lb 4.1 oz (55 kg) (10/11 0514) Last BM Date: 08/22/14  Filed Weights   08/23/14 1948 08/24/14 0605 08/25/14 0514  Weight: 119 lb 4.3 oz (54.1 kg) 120 lb 2.4 oz (54.5 kg) 121 lb 4.1 oz (55 kg)    Intake/Output Summary (Last 24 hours) at 08/25/14 0935 Last data filed at 08/25/14 0930  Gross per 24 hour  Intake 1456.25 ml  Output      0 ml  Net 1456.25 ml    Telemetry: SR with NSVT early morning hours.   Exam:  General: No acute distress.  HEENT: Conjunctiva and lids normal, oropharynx clear.  Lungs: Clear to auscultation, nonlabored.  Cardiac: No elevated JVP or bruits. RRR with S4., no gallop or rub.   Abdomen: Normoactive bowel sounds, nontender, nondistended.  Extremities: No pitting edema, distal pulses full.  Neuropsychiatric: Alert and oriented x3, affect appropriate.  Echocardiogram: 05/15/2014  Left ventricle: The cavity size was normal. Systolic function was vigorous. The estimated ejection fraction was in the range of 65% to 70%. Wall motion was normal; there were no regional wall motion abnormalities. Doppler parameters are consistent with abnormal left ventricular relaxation (grade 1 diastolic Dysfunction). Aortic valve: Trileaflet; normal thickness, moderately calcified leaflets. Mobility was not restricted. Doppler: Transvalvular velocity was within the normal range. There was no stenosis. There was no regurgitation. Valve area (VTI): 1.5 cm^2. Indexed valve area (VTI): 0.96 cm^2/m^2. Valve area  (Vmax): 1.63 cm^2. Indexed valve area (Vmax): 1.04 cm^2/m^2. Mean gradient (S): 4 mm Hg.  Mitral valve: Moderately calcified annulus.   Lab Results:  Basic Metabolic Panel:  Recent Labs Lab 08/23/14 1315 08/24/14 0720  NA 141 140  K 4.3 4.0  CL 104 104  CO2 25 25  GLUCOSE 116* 139*  BUN 13 13  CREATININE 0.95 1.03  CALCIUM 9.7 9.9    Liver Function Tests:  Recent Labs Lab 08/23/14 1315  AST 28  ALT 14  ALKPHOS 45  BILITOT 0.9  PROT 7.5  ALBUMIN 4.0    CBC:  Recent Labs Lab 08/23/14 1315  WBC 13.3*  HGB 14.4  HCT 43.0  MCV 92.5  PLT 172    Cardiac Enzymes:  Recent Labs Lab 08/23/14 2023 08/24/14 0120 08/24/14 0720  TROPONINI <0.30 <0.30 <0.30    Radiology: Dg Chest Portable 1 View  08/23/2014   CLINICAL DATA:  Initial encounter for loss of consciousness today with right shoulder pain.  EXAM: PORTABLE CHEST - 1 VIEW  COMPARISON:  01/24/2013  FINDINGS: 1318 hrs. Lower face obscures lung apices. Hyper expansion is consistent with emphysema. The lungs are clear without focal infiltrate, edema, pneumothorax or pleural effusion. The cardiopericardial silhouette is within normal limits for size. Imaged bony structures of the thorax are intact. Patient is status post right shoulder replacement. Telemetry leads overlie the chest.  IMPRESSION: Emphysema without acute cardiopulmonary process.   Electronically Signed   By: Misty Stanley M.D.   On: 08/23/2014 13:47   Dg Shoulder Right Port  08/23/2014  CLINICAL DATA:  Pain post trauma; total shoulder replacement on the right  EXAM: PORTABLE RIGHT SHOULDER - 2+ VIEW  COMPARISON:  January 26, 2013  FINDINGS: Internal rotation, external rotation, and Y scapular images were obtained. Patient is status post reverse right shoulder arthroplasty. Prosthetic components appear well seated. No acute fracture or dislocation. There is bony overgrowth along the lateral olecranon inferiorly. No erosive change.  IMPRESSION: Prosthetic  components appear well seated. No fracture or dislocation.   Electronically Signed   By: Lowella Grip M.D.   On: 08/23/2014 13:46     Medications:   Scheduled Medications: . amLODipine  2.5 mg Oral Daily  . aspirin EC  81 mg Oral Daily  . cilostazol  50 mg Oral BID  . citalopram  10 mg Oral Daily  . clopidogrel  75 mg Oral Daily  . ezetimibe  10 mg Oral Daily  . heparin  5,000 Units Subcutaneous 3 times per day  . Influenza vac split quadrivalent PF  0.5 mL Intramuscular Tomorrow-1000  . losartan  50 mg Oral Daily  . metoprolol tartrate  100 mg Oral Daily  . metoprolol tartrate  75 mg Oral q1800  . omega-3 acid ethyl esters  1 g Oral Daily  . pantoprazole  40 mg Oral Daily  . pneumococcal 23 valent vaccine  0.5 mL Intramuscular Tomorrow-1000  . pravastatin  40 mg Oral q1800  . timolol  1 drop Both Eyes BID    Infusions: . sodium chloride 75 mL/hr at 08/25/14 0601    PRN Medications: acetaminophen, nitroGLYCERIN, ondansetron (ZOFRAN) IV   Assessment and Plan:   1.  Syncope  Was orthostatic yesterday  Now BP is improved  Still feels weak though she has not been out of bed  Will get PT to see Note she had 7 beats NSVT last night.  Hx of NSVT in past  On b blocker.   Echo in July 2015 with LVEF normal  Continue meds   She has had syncopal spells in past.   Carotid USN pending   2. Orthostatic Hypotension: This has resolved with changes in medications and IV hydration. Still feels weak.  3. Deconditioning and Weakness: PT consultation.   Patient lives with son who works during day  Has to be able to move around.    4. PAD: Will check carotids.  Phill Myron. Lawrence NP  08/25/2014, 9:35 AM  Patinet seen and examined.  Agree with findings as noted above by Arnold Long  I have amended note to reflect my findings.    Dorris Carnes

## 2014-08-25 NOTE — Progress Notes (Signed)
Utilization Review Completed.   Sheenah Dimitroff, RN, BSN Nurse Case Manager  

## 2014-08-26 DIAGNOSIS — I129 Hypertensive chronic kidney disease with stage 1 through stage 4 chronic kidney disease, or unspecified chronic kidney disease: Secondary | ICD-10-CM | POA: Diagnosis present

## 2014-08-26 DIAGNOSIS — R55 Syncope and collapse: Secondary | ICD-10-CM | POA: Diagnosis present

## 2014-08-26 DIAGNOSIS — I739 Peripheral vascular disease, unspecified: Secondary | ICD-10-CM | POA: Diagnosis present

## 2014-08-26 DIAGNOSIS — Z87891 Personal history of nicotine dependence: Secondary | ICD-10-CM | POA: Diagnosis not present

## 2014-08-26 DIAGNOSIS — S40011A Contusion of right shoulder, initial encounter: Secondary | ICD-10-CM | POA: Diagnosis present

## 2014-08-26 DIAGNOSIS — Z7902 Long term (current) use of antithrombotics/antiplatelets: Secondary | ICD-10-CM | POA: Diagnosis not present

## 2014-08-26 DIAGNOSIS — F329 Major depressive disorder, single episode, unspecified: Secondary | ICD-10-CM | POA: Diagnosis present

## 2014-08-26 DIAGNOSIS — Z7982 Long term (current) use of aspirin: Secondary | ICD-10-CM | POA: Diagnosis not present

## 2014-08-26 DIAGNOSIS — Z889 Allergy status to unspecified drugs, medicaments and biological substances status: Secondary | ICD-10-CM | POA: Diagnosis not present

## 2014-08-26 DIAGNOSIS — Z803 Family history of malignant neoplasm of breast: Secondary | ICD-10-CM | POA: Diagnosis not present

## 2014-08-26 DIAGNOSIS — H409 Unspecified glaucoma: Secondary | ICD-10-CM | POA: Diagnosis present

## 2014-08-26 DIAGNOSIS — I951 Orthostatic hypotension: Secondary | ICD-10-CM | POA: Diagnosis present

## 2014-08-26 DIAGNOSIS — Z8049 Family history of malignant neoplasm of other genital organs: Secondary | ICD-10-CM | POA: Diagnosis not present

## 2014-08-26 DIAGNOSIS — Z8249 Family history of ischemic heart disease and other diseases of the circulatory system: Secondary | ICD-10-CM | POA: Diagnosis not present

## 2014-08-26 DIAGNOSIS — K219 Gastro-esophageal reflux disease without esophagitis: Secondary | ICD-10-CM | POA: Diagnosis present

## 2014-08-26 DIAGNOSIS — M1990 Unspecified osteoarthritis, unspecified site: Secondary | ICD-10-CM | POA: Diagnosis present

## 2014-08-26 DIAGNOSIS — I471 Supraventricular tachycardia: Secondary | ICD-10-CM

## 2014-08-26 DIAGNOSIS — R040 Epistaxis: Secondary | ICD-10-CM | POA: Diagnosis present

## 2014-08-26 DIAGNOSIS — Z23 Encounter for immunization: Secondary | ICD-10-CM | POA: Diagnosis not present

## 2014-08-26 DIAGNOSIS — Z808 Family history of malignant neoplasm of other organs or systems: Secondary | ICD-10-CM | POA: Diagnosis not present

## 2014-08-26 DIAGNOSIS — W1830XA Fall on same level, unspecified, initial encounter: Secondary | ICD-10-CM | POA: Diagnosis present

## 2014-08-26 DIAGNOSIS — Z96611 Presence of right artificial shoulder joint: Secondary | ICD-10-CM | POA: Diagnosis present

## 2014-08-26 DIAGNOSIS — M810 Age-related osteoporosis without current pathological fracture: Secondary | ICD-10-CM | POA: Diagnosis present

## 2014-08-26 DIAGNOSIS — R109 Unspecified abdominal pain: Secondary | ICD-10-CM | POA: Diagnosis not present

## 2014-08-26 DIAGNOSIS — I440 Atrioventricular block, first degree: Secondary | ICD-10-CM | POA: Diagnosis present

## 2014-08-26 DIAGNOSIS — E785 Hyperlipidemia, unspecified: Secondary | ICD-10-CM | POA: Diagnosis present

## 2014-08-26 DIAGNOSIS — N189 Chronic kidney disease, unspecified: Secondary | ICD-10-CM | POA: Diagnosis present

## 2014-08-26 DIAGNOSIS — F0390 Unspecified dementia without behavioral disturbance: Secondary | ICD-10-CM | POA: Diagnosis present

## 2014-08-26 DIAGNOSIS — Z800 Family history of malignant neoplasm of digestive organs: Secondary | ICD-10-CM | POA: Diagnosis not present

## 2014-08-26 MED ORDER — ENSURE COMPLETE PO LIQD
237.0000 mL | Freq: Three times a day (TID) | ORAL | Status: DC
  Administered 2014-08-26 – 2014-08-27 (×3): 237 mL via ORAL

## 2014-08-26 MED ORDER — POLYETHYLENE GLYCOL 3350 17 G PO PACK
17.0000 g | PACK | Freq: Every day | ORAL | Status: DC | PRN
  Administered 2014-08-26: 17 g via ORAL
  Filled 2014-08-26: qty 1

## 2014-08-26 MED ORDER — DOCUSATE SODIUM 100 MG PO CAPS
200.0000 mg | ORAL_CAPSULE | Freq: Every day | ORAL | Status: DC
  Administered 2014-08-26 – 2014-08-27 (×2): 200 mg via ORAL
  Filled 2014-08-26 (×2): qty 2

## 2014-08-26 NOTE — Evaluation (Signed)
Physical Therapy Evaluation Patient Details Name: Julie Bass MRN: [id number] DOB: 1926-08-20 Today's Date: 08/26/2014   History of Present Illness  Patient is a 80 y/o female with PMH of hypertension, hyperlipidemia, peripheral vascular disease status post left SFA stent, and history of nonsustained VT presents with syncope. Chest XRAY- emphysema w/out acute cardiopulmonary process. Cardiac workup pending.   Clinical Impression  Patient presents with functional limitations due to deficits listed in PT problem list (see below). Pt with mild balance deficits requiring use of AD for support. Pt very aware of deficits and cautious during mobility. Owns RW at home. Discussed safety techniques during mobility at home to decrease falls risk. Pt would benefit from skilled acute PT and follow up HHPT to improve overall functional mobility and safety so pt can return to PLOF. Recommend supervision initially at home until balance improves.    Follow Up Recommendations Home health PT;Supervision for mobility/OOB    Equipment Recommendations  None recommended by PT    Recommendations for Other Services OT consult     Precautions / Restrictions Precautions Precautions: Fall Restrictions Weight Bearing Restrictions: No      Mobility  Bed Mobility               General bed mobility comments: Sitting EOB upon PT arrival.   Transfers Overall transfer level: Needs assistance Equipment used: Rolling walker (2 wheeled) Transfers: Sit to/from Stand Sit to Stand: Min guard         General transfer comment: Min guard for safety. Slow movements for all transfers. Transferred on/off toilet with grab bar for assist.   Ambulation/Gait Ambulation/Gait assistance: Min guard Ambulation Distance (Feet): 150 Feet Assistive device: Rolling walker (2 wheeled);None Gait Pattern/deviations: Step-through pattern;Decreased stride length;Narrow base of support;Trunk flexed Gait velocity: .92  ft/sec Gait velocity interpretation: <1.8 ft/sec, indicative of risk for recurrent falls General Gait Details: Very slow, steady gait. Stability improved towards end of gait bout. Able to ambulate within room without RW (furniture walking) however requested need for RW for hallway ambulation due to feeling unsteady.  Stairs            Wheelchair Mobility    Modified Rankin (Stroke Patients Only)       Balance Overall balance assessment: Needs assistance   Sitting balance-Leahy Scale: Good       Standing balance-Leahy Scale: Fair Standing balance comment: Able to perform pericare in standing and pull up under garments without LOB. Able to perform dynamic standing - washing hands and walking to throw away towel without difficulty.                              Pertinent Vitals/Pain Pain Assessment: No/denies pain (No pain reported but pt states she has discomfort in left side of back - premorbid.)    Home Living Family/patient expects to be discharged to:: Private residence Living Arrangements: Other relatives Available Help at Discharge: Family;Available PRN/intermittently (grandson lives with her but works during the day and there at night.) Type of Home: House Home Access: Stairs to enter Entrance Stairs-Rails: Can reach both Technical brewer of Steps: 5 Home Layout: One level Home Equipment: [place] - 2 wheels      Prior Function Level of Independence: Independent         Comments: Pt reports furniture walking at home. Pt reports trying to bathe when family home.     Hand Dominance   Dominant Hand: Right  Extremity/Trunk Assessment   Upper Extremity Assessment: Overall WFL for tasks assessed           Lower Extremity Assessment: Generalized weakness         Communication   Communication: No difficulties  Cognition Arousal/Alertness: Awake/alert Behavior During Therapy: WFL for tasks assessed/performed Overall Cognitive  Status: Within Functional Limits for tasks assessed                      General Comments      Exercises        Assessment/Plan    PT Assessment Patient needs continued PT services  PT Diagnosis Generalized weakness;Difficulty walking   PT Problem List Decreased strength;Cardiopulmonary status limiting activity;Decreased activity tolerance;Decreased balance  PT Treatment Interventions Balance training;Gait training;Stair training;Patient/family education;Functional mobility training;Therapeutic activities;Therapeutic exercise   PT Goals (Current goals can be found in the Care Plan section) Acute Rehab PT Goals Patient Stated Goal: to get back to feeling like herself. PT Goal Formulation: With patient Time For Goal Achievement: 09/09/14 Potential to Achieve Goals: Good    Frequency Min 3X/week   Barriers to discharge Decreased caregiver support Pt home alone during the day.    Co-evaluation               End of Session Equipment Utilized During Treatment: Gait belt Activity Tolerance: Patient limited by fatigue Patient left: in chair;with call bell/phone within reach (RN reports pt does not need chair alarm.) Nurse Communication: Mobility status    Functional Assessment Tool Used: Clinical judgment Functional Limitation: Mobility: Walking and moving around Mobility: Walking and Moving Around Current Status (J1914): At least 1 percent but less than 20 percent impaired, limited or restricted Mobility: Walking and Moving Around Goal Status [phone]): At least 1 percent but less than 20 percent impaired, limited or restricted    Time: 0940-1016 PT Time Calculation (min): 36 min   Charges:   PT Evaluation $Initial PT Evaluation Tier I: 1 Procedure PT Treatments $Therapeutic Activity: 8-22 mins   PT G Codes:   Functional Assessment Tool Used: Clinical judgment Functional Limitation: Mobility: Walking and moving around    [place], [place] A 08/26/2014,  10:43 AM Candy Sledge, PT, DPT [phone]

## 2014-08-26 NOTE — Progress Notes (Signed)
Subjective: Feeling better, though still weak, did not think she was dizzy this AM with orthostatic VS check.  Objective: Vital signs in last 24 hours: Temp:  [98.6 F (37 C)-99 F (37.2 C)] 98.6 F (37 C) (10/12 0638) Pulse Rate:  [64-71] 71 (10/12 0638) Resp:  [18] 18 (10/12 0638) BP: (117-163)/(43-95) 163/61 mmHg (10/12 0638) SpO2:  [94 %-98 %] 96 % (10/12 0638) Weight:  [124 lb 3.2 oz (56.337 kg)] 124 lb 3.2 oz (56.337 kg) (10/12 2119) Weight change: 2 lb 15.2 oz (1.337 kg) Last BM Date: 08/22/14 Intake/Output from previous day: +100 10/11 0701 - 10/12 0700 In: 100 [P.O.:100] Out: -  Intake/Output this shift:    PE: General:Pleasant affect, NAD Skin:Warm and dry, brisk capillary refill HEENT:normocephalic, sclera clear, mucus membranes moist Heart:S1S2 RRR without murmur, gallup, rub or click Lungs:clear without rales, rhonchi, or wheezes ERD:EYCX, non tender, + BS, do not palpate liver spleen or masses Ext:no lower ext edema, 2+ pedal pulses, 2+ radial pulses Neuro:alert and oriented, MAE, follows commands, + facial symmetry Tele: SVT at 2330 last night, runs of PSVT up to 10 beats probable parox atrial tach).    Lab Results:  Recent Labs  08/23/14 1315  WBC 13.3*  HGB 14.4  HCT 43.0  PLT 172   BMET  Recent Labs  08/23/14 1315 08/24/14 0720  NA 141 140  K 4.3 4.0  CL 104 104  CO2 25 25  GLUCOSE 116* 139*  BUN 13 13  CREATININE 0.95 1.03  CALCIUM 9.7 9.9    Recent Labs  08/24/14 0120 08/24/14 0720  TROPONINI <0.30 <0.30    Lab Results  Component Value Date   CHOL 215* 08/24/2014   HDL 82 08/24/2014   LDLCALC 113* 08/24/2014   TRIG 99 08/24/2014   CHOLHDL 2.6 08/24/2014   Lab Results  Component Value Date   HGBA1C 5.6 05/15/2014     Lab Results  Component Value Date   TSH 2.203 05/15/2014    Hepatic Function Panel  Recent Labs  08/23/14 1315  PROT 7.5  ALBUMIN 4.0  AST 28  ALT 14  ALKPHOS 45  BILITOT 0.9     Recent Labs  08/24/14 0120  CHOL 215*    Medications: I have reviewed the patient's current medications. Scheduled Meds: . amLODipine  2.5 mg Oral Daily  . aspirin EC  81 mg Oral Daily  . cilostazol  50 mg Oral BID  . citalopram  10 mg Oral Daily  . clopidogrel  75 mg Oral Daily  . ezetimibe  10 mg Oral Daily  . heparin  5,000 Units Subcutaneous 3 times per day  . losartan  50 mg Oral Daily  . metoprolol tartrate  100 mg Oral Daily  . metoprolol tartrate  75 mg Oral q1800  . omega-3 acid ethyl esters  1 g Oral Daily  . pantoprazole  40 mg Oral Daily  . pravastatin  40 mg Oral q1800  . timolol  1 drop Both Eyes BID   Continuous Infusions:  PRN Meds:.acetaminophen, nitroGLYCERIN, ondansetron (ZOFRAN) IV  Assessment/Plan: 1. Syncope-2 previous episodes of syncope before the one that brought to [facility].  Was orthostatic 2 days ago, BP was improved -but today again orthostatic, Still feels weak though she has not been out of bed Will get PT to see  Note she had 10 beats PSVT last night (150bpm). Hx of NSVT in past On b blocker.  Echo in July 2015  with LVEF normal Continue meds  She has had syncopal spells in past.  Carotid USN pending  troponins negative  2. Orthostatic Hypotension: This had improved with changes in medications and IV hydration. Still feels weak. Today lying 163/61, sitting 149/60 and standing 140/64- though no dizziness with this.   3. Deconditioning and Weakness: PT consultation. Patient lives with son who works during day Has to be able to move around.   4. PAD: Will check carotids- pending   LOS: 3 days   Time spent with pt. :15 minutes. [facility] R  Nurse Practitioner Certified Pager [phone] or after 5pm and on weekends call [phone] 08/26/2014, 7:45 AM  Personally seen and examined. Agree with above. Carotids prelim reassuring.  PSVT 150 bpm on tele. No VT last night.  PT to see, deconditioning.  Syncope likely orthostatic related but can  not exclude NSVT as etiology. She states that she has NO warning. BP is quite labile. Would be willing to tolerate higher BP to avoid lows. No driving.  Will consult EP for loop recorder.

## 2014-08-26 NOTE — Progress Notes (Signed)
*  PRELIMINARY RESULTS* Vascular Ultrasound Carotid Duplex (Doppler) has been completed. Findings suggest 1-39% internal carotid artery stenosis bilaterally. The left vertebral artery is patent with antegrade flow. Unable to visualize the right vertebral artery.  08/26/2014 9:49 AM Maudry Mayhew, RVT, RDCS, RDMS

## 2014-08-26 NOTE — Progress Notes (Signed)
INITIAL NUTRITION ASSESSMENT  DOCUMENTATION CODES Per approved criteria  -Not Applicable   INTERVENTION:  Ensure Complete PO TID, each supplement provides 350 kcal and 13 grams of protein  NUTRITION DIAGNOSIS: Inadequate oral intake related to food preferences as evidenced by intake </= 25% of meals.   Goal: Intake to meet >90% of estimated nutrition needs.  Monitor:  PO intake, labs, weight trend.  Reason for Assessment: MD Consult for poor PO intake  80 y.o. female  Admitting Dx: Syncope  ASSESSMENT: 80 year old African American female with past medical history significant for hypertension, hyperlipidemia, peripheral vascular disease status post left SFA stent, and history of nonsustained VT presented with syncope.  Patient reports that she eats well at home. She has been eating poorly since admission to the hospital because she does not like the food. Now with some abdominal pain. She used to drink Ensure at home, but has not drank any lately. Agreed to receive Ensure Complete supplements between meals to maximize oral intake.   Nutrition Focused Physical Exam:  Subcutaneous Fat:  Orbital Region: WNL Upper Arm Region: WNL Thoracic and Lumbar Region: WNL  Muscle:  Temple Region: mild depletion Clavicle Bone Region: mild depletion Clavicle and Acromion Bone Region: mild depletion Scapular Bone Region: WNL Dorsal Hand: moderate depletion Patellar Region: moderate depletion Anterior Thigh Region: moderate depletion Posterior Calf Region: moderate depletion  Edema: none   Height: Ht Readings from Last 1 Encounters:  08/26/14 5\' 3"  (1.6 m)    Weight: Wt Readings from Last 1 Encounters:  08/26/14 124 lb 3.2 oz (56.337 kg)    Ideal Body Weight: 52.3 kg  % Ideal Body Weight: 108%  Wt Readings from Last 10 Encounters:  08/26/14 124 lb 3.2 oz (56.337 kg)  05/08/14 121 lb 1.6 oz (54.931 kg)  01/24/13 126 lb 14.4 oz (57.561 kg)  12/25/12 131 lb (59.421 kg)   07/25/12 136 lb (61.689 kg)  10/19/11 127 lb 6.8 oz (57.8 kg)  09/29/11 128 lb (58.06 kg)  09/28/10 135 lb (61.236 kg)    Usual Body Weight: 135-140 lb (per patient years ago)  % Usual Body Weight: 90%  BMI:  Body mass index is 22.01 kg/(m^2).  Estimated Nutritional Needs: Kcal: 1300-1500 Protein: 65-80 gm Fluid: 1.3-1.5 L  Skin: WDL  Diet Order: Cardiac  EDUCATION NEEDS: -Education needs addressed   Intake/Output Summary (Last 24 hours) at 08/26/14 1306 Last data filed at 08/26/14 0900  Gross per 24 hour  Intake    100 ml  Output      0 ml  Net    100 ml    Last BM: 10/8   Labs:   Recent Labs Lab 08/23/14 1315 08/24/14 0720 08/25/14 1310  NA 141 140  --   K 4.3 4.0  --   CL 104 104  --   CO2 25 25  --   BUN 13 13  --   CREATININE 0.95 1.03  --   CALCIUM 9.7 9.9  --   MG  --   --  1.8  GLUCOSE 116* 139*  --     CBG (last 3)  No results found for this basename: GLUCAP,  in the last 72 hours  Scheduled Meds: . amLODipine  2.5 mg Oral Daily  . aspirin EC  81 mg Oral Daily  . cilostazol  50 mg Oral BID  . citalopram  10 mg Oral Daily  . clopidogrel  75 mg Oral Daily  . docusate sodium  200 mg Oral  Daily  . ezetimibe  10 mg Oral Daily  . heparin  5,000 Units Subcutaneous 3 times per day  . losartan  50 mg Oral Daily  . metoprolol tartrate  100 mg Oral Daily  . metoprolol tartrate  75 mg Oral q1800  . omega-3 acid ethyl esters  1 g Oral Daily  . pantoprazole  40 mg Oral Daily  . pravastatin  40 mg Oral q1800  . timolol  1 drop Both Eyes BID    Continuous Infusions:   Past Medical History  Diagnosis Date  . Glaucoma   . Osteoporosis   . Hyperlipemia 10/18/2011  . PAD (peripheral artery disease) 10/18/2011    h/o left SFA stent  . HTN (hypertension) 10/18/2011  . NSVT (nonsustained ventricular tachycardia) 10/18/2011  . Depression   . Chronic kidney disease     KIDNEY STONES  . Hiatal hernia   . Arthritis   . Dementia     BEGINNING  STAGES   . GERD (gastroesophageal reflux disease)     Past Surgical History  Procedure Laterality Date  . Cholecystectomy    . Abdominal hysterectomy  1973  . Cardiac catheterization      2012  . Reverse shoulder arthroplasty Right 01/26/2013    Procedure: RIGHT REVERSE TOTAL SHOULDER ARTHROPLASTY;  Surgeon: Augustin Schooling, MD;  Location: [facility];  Service: Orthopedics;  Laterality: Right;  . Transthoracic echocardiogram  10/2012    EF 75-70%, mod conc hypertrophy, severely calcified MV annulus, LA mildly dailted, PA peak pressure 3mmHg  . Nm myocar perf wall motion  10/2011    non-gated - normal stuy, EF 82%, normal LV wall motion  . Lower extremity arterial doppler  2013    right SFA with 50-69% diameter reduction, right PTA/peroneal occluded, L SFA prox to stent has narrowing with increased velocities >60% diameter reduction, L SFA stent patent, L ATA w/occlusive disease, bilat ABIs show mild arterial insuffiency at rest  . Lower extremity angiogram  11/02/2007    stent of mid left SFA with 6x134mm EV3 self-expanding stent and 6x3 in prox region (Dr. Adora Fridge)  . Lower extremity angiogram      Molli Barrows, RD, LDN, [place] Pager [phone] After Hours Pager [phone]

## 2014-08-26 NOTE — Progress Notes (Addendum)
Pt and her daughter concerned about abdominal pain and the diarrhea last week.  The decreased appetite in combination -discussed with Dr. Acie Fredrickson,  GI consult to help decide which test- plese call am of 10/13.  She is followed by [facility].Marland Kitchen  Will check amylase, lipase in am.

## 2014-08-27 ENCOUNTER — Encounter (HOSPITAL_COMMUNITY)
Admission: EM | Disposition: A | Discharge status: Home Health | Payer: Self-pay | Source: Home / Self Care | Attending: Cardiology

## 2014-08-27 DIAGNOSIS — R5381 Other malaise: Secondary | ICD-10-CM | POA: Diagnosis present

## 2014-08-27 DIAGNOSIS — I951 Orthostatic hypotension: Principal | ICD-10-CM

## 2014-08-27 DIAGNOSIS — R55 Syncope and collapse: Secondary | ICD-10-CM

## 2014-08-27 DIAGNOSIS — R109 Unspecified abdominal pain: Secondary | ICD-10-CM | POA: Diagnosis present

## 2014-08-27 HISTORY — PX: LOOP RECORDER IMPLANT: SHX5477

## 2014-08-27 LAB — BASIC METABOLIC PANEL
Anion gap: 10 (ref 5–15)
BUN: 7 mg/dL (ref 6–23)
CO2: 27 mEq/L (ref 19–32)
Calcium: 9.4 mg/dL (ref 8.4–10.5)
Chloride: 103 mEq/L (ref 96–112)
Creatinine, Ser: 0.88 mg/dL (ref 0.50–1.10)
GFR calc Af Amer: 67 mL/min — ABNORMAL LOW (ref >90)
GFR calc non Af Amer: 58 mL/min — ABNORMAL LOW (ref >90)
Glucose, Bld: 110 mg/dL — ABNORMAL HIGH (ref 70–99)
Potassium: 3.7 mEq/L (ref 3.7–5.3)
Sodium: 140 mEq/L (ref 137–147)

## 2014-08-27 LAB — AMYLASE: Amylase: 49 U/L (ref 0–105)

## 2014-08-27 LAB — LIPASE, BLOOD: Lipase: 35 U/L (ref 11–59)

## 2014-08-27 SURGERY — LOOP RECORDER IMPLANT
Anesthesia: LOCAL

## 2014-08-27 MED ORDER — AMLODIPINE BESYLATE 5 MG PO TABS: 2.5000 mg | ORAL_TABLET | Freq: Every day | ORAL | Status: DC

## 2014-08-27 MED ORDER — ENSURE COMPLETE PO LIQD: 237.0000 mL | Freq: Three times a day (TID) | ORAL | Status: DC

## 2014-08-27 MED ORDER — POLYETHYLENE GLYCOL 3350 17 G PO PACK: 17.0000 g | PACK | Freq: Every day | ORAL | Status: DC | PRN

## 2014-08-27 MED ORDER — DSS 100 MG PO CAPS: 200.0000 mg | ORAL_CAPSULE | Freq: Every day | ORAL | Status: DC

## 2014-08-27 MED ORDER — LIDOCAINE-EPINEPHRINE 1 %-1:100000 IJ SOLN
INTRAMUSCULAR | Status: AC
  Filled 2014-08-27: qty 1

## 2014-08-27 NOTE — Discharge Summary (Signed)
CARDIOLOGY DISCHARGE SUMMARY   Patient ID: Julie Bass MRN: [id number] DOB/AGE: May 27, 1926 80 y.o.  Admit date: 08/23/2014 Discharge date: 08/27/2014  PCP: [name], MD Primary Cardiologist: Dr. Claiborne Billings  Primary Discharge Diagnosis:    Syncope - loop recorder implanted Secondary Discharge Diagnosis:    PAD (peripheral artery disease)   Orthostatic hypotension   Physical deconditioning   Abdominal pain in female  Consults: EP  Procedures: R shoulder Xray, Carotid Dopplers, implantation of a Medtronic Reveal LINQ implantable loop recorder   Hospital Course: Julie Bass is an 80 year old African American female with past medical history significant for hypertension, hyperlipidemia, peripheral vascular disease status post left SFA stent, and history of nonsustained VT who presented with syncope.   Initial orthostatic VS were positive so she was treated with IVF and improved. She remained weak, and was seen by PT. She is deconditioned and HHPT was recommended. This was ordered.   She had some abdominal pain and an amylase plus lipase were checked. They were within normal limits. It was felt we should refer her to GI, she was previously followed by Dr. Oletta Lamas. The office was contacted and she will see them as an outpatient. She was complaining of occasional constipation, so she was started on Colace and PRN Miralax.  She was monitored on telemetry during her stay. She had short runs of NSVT as well as brief runs of narrow-complex tachycardia, felt to be atrial tachycardia vs PAF. She is on metoprolol and is to continue this. The runs she had on telemetry were asymptomatic. Because of the syncope, a Medtronic Reveal LINQ implantable loop recorder was implanted to monitor her as an outpatient. She will follow up as an outpatient for this.  She has a history of hyperlipidemia, but is intolerant to Lipitor and Crestor. She is on Livalo and Zetia. A profile is below.  Her LFTs were normal, and she is to continue current therapy.  On 10/13, she was seen by Dr. Marlou Porch and all data were reviewed. She was improving and the loop recorder was implanted. No further inpatient workup was indicated and she is considered stable for discharge, to follow up as an outpatient.  Labs:   Lab Results  Component Value Date   WBC 13.3* 08/23/2014   HGB 14.4 08/23/2014   HCT 43.0 08/23/2014   MCV 92.5 08/23/2014   PLT 172 08/23/2014    Recent Labs Lab 08/23/14 1315  08/27/14 0352  NA 141  < > 140  K 4.3  < > 3.7  CL 104  < > 103  CO2 25  < > 27  BUN 13  < > 7  CREATININE 0.95  < > 0.88  CALCIUM 9.7  < > 9.4  PROT 7.5  --   --   BILITOT 0.9  --   --   ALKPHOS 45  --   --   ALT 14  --   --   AST 28  --   --   GLUCOSE 116*  < > 110*  < > = values in this interval not displayed.  Lipid Panel     Component Value Date/Time   CHOL 215* 08/24/2014 0120   TRIG 99 08/24/2014 0120   HDL 82 08/24/2014 0120   CHOLHDL 2.6 08/24/2014 0120   VLDL 20 08/24/2014 0120   LDLCALC 113* 08/24/2014 0120    Radiology: Dg Chest Portable 1 View 08/23/2014   CLINICAL DATA:  Initial encounter for loss of consciousness  today with right shoulder pain.  EXAM: PORTABLE CHEST - 1 VIEW  COMPARISON:  01/24/2013  FINDINGS: 1318 hrs. Lower face obscures lung apices. Hyper expansion is consistent with emphysema. The lungs are clear without focal infiltrate, edema, pneumothorax or pleural effusion. The cardiopericardial silhouette is within normal limits for size. Imaged bony structures of the thorax are intact. Patient is status post right shoulder replacement. Telemetry leads overlie the chest.  IMPRESSION: Emphysema without acute cardiopulmonary process.   Electronically Signed   By: Misty Stanley M.D.   On: 08/23/2014 13:47   Dg Shoulder Right Port 08/23/2014   CLINICAL DATA:  Pain post trauma; total shoulder replacement on the right  EXAM: PORTABLE RIGHT SHOULDER - 2+ VIEW  COMPARISON:  January 26, 2013  FINDINGS: Internal rotation, external rotation, and Y scapular images were obtained. Patient is status post reverse right shoulder arthroplasty. Prosthetic components appear well seated. No acute fracture or dislocation. There is bony overgrowth along the lateral olecranon inferiorly. No erosive change.  IMPRESSION: Prosthetic components appear well seated. No fracture or dislocation.   Electronically Signed   By: Lowella Grip M.D.   On: 08/23/2014 13:46   EKG: 08/25/2014 SR Vent. rate 71 BPM PR interval 236 ms QRS duration 80 ms QT/QTc 414/449 ms P-R-T axes 67 9 98  Carotid Dopplers: 08/26/2014  Findings suggest 1-39% internal carotid artery stenosis bilaterally. The left vertebral artery is patent with antegrade flow. Unable to visualize the right vertebral artery  FOLLOW UP PLANS AND APPOINTMENTS Allergies  Allergen Reactions  . Crestor [Rosuvastatin Calcium]     Muscle pain  . Lipitor [Atorvastatin Calcium]     Muscle pain     Medication List         amLODipine 5 MG tablet  Commonly known as:  NORVASC  Take 0.5 tablets (2.5 mg total) by mouth daily.     aspirin EC 81 MG tablet  Take 81 mg by mouth daily.     cilostazol 50 MG tablet  Commonly known as:  PLETAL  Take 50 mg by mouth 2 (two) times daily.     citalopram 10 MG tablet  Commonly known as:  CELEXA  Take 1 tablet (10 mg total) by mouth daily.     clopidogrel 75 MG tablet  Commonly known as:  PLAVIX  Take 1 tablet (75 mg total) by mouth daily.     DSS 100 MG Caps  Take 200 mg by mouth daily.     ezetimibe 10 MG tablet  Commonly known as:  ZETIA  Take 1 tablet (10 mg total) by mouth daily.     feeding supplement (ENSURE COMPLETE) Liqd  Take 237 mLs by mouth 3 (three) times daily between meals.     losartan 100 MG tablet  Commonly known as:  COZAAR  Take 1 tablet (100 mg total) by mouth daily.     metoprolol 50 MG tablet  Commonly known as:  LOPRESSOR  Take 1.5-2 tablets (75-100 mg  total) by mouth 2 (two) times daily. Take 100mg  in the morning and 75mg  in the evening     omega-3 acid ethyl esters 1 G capsule  Commonly known as:  LOVAZA  Take 1 capsule (1 g total) by mouth daily.     pantoprazole 40 MG tablet  Commonly known as:  PROTONIX  Take 1 tablet (40 mg total) by mouth daily.     Pitavastatin Calcium 2 MG Tabs  Commonly known as:  LIVALO  Take 1 tablet (  2 mg total) by mouth daily.     polyethylene glycol packet  Commonly known as:  MIRALAX / GLYCOLAX  Take 17 g by mouth daily as needed for mild constipation.     timolol 0.5 % ophthalmic solution  Commonly known as:  TIMOPTIC  Place 1 drop into both eyes 2 (two) times daily.        Discharge Instructions   Diet - low sodium heart healthy    Complete by:  As directed      Increase activity slowly    Complete by:  As directed           Follow-up Information   Follow up with Troy Sine, MD. (The office will call. )    Specialty:  Cardiology   Contact information:   [address] [place] [address] [phone]       Follow up with Winfield Cunas, MD On 09/16/2014. (See at 8:30 am.)    Specialty:  Gastroenterology   Contact information:   [place] [address] [phone]       Follow up with [place]. (Registered Nurse)    Sport and exercise psychologist information:   [address] [place] [address] [phone]       BRING ALL MEDICATIONS WITH YOU TO FOLLOW UP APPOINTMENTS  Time spent with patient to include physician time: 43 min Signed: Rosaria Ferries, PA-C 08/27/2014, 5:17 PM Co-Sign MD

## 2014-08-27 NOTE — Progress Notes (Signed)
Patient verbalized understanding of discharge instructions, VSS. All belongings returned to patient, patient discharge to home with daughter.

## 2014-08-27 NOTE — Op Note (Signed)
SURGEON:  Thompson Grayer, MD     PREPROCEDURE DIAGNOSIS:  Unexplained syncope    POSTPROCEDURE DIAGNOSIS:  Unexplained syncope     PROCEDURES:   1. Implantable loop recorder implantation    INTRODUCTION:  Julie Bass is a 80 y.o. female with a history of recurrent unexplained syncope who presents today for implantable loop implantation.  Despite an extensive workup, no reversible causes have been identified.  she has worn telemetry during which she did not have arrhythmias.  There is significant concern for arrhythmia as the cause for the patients syncope.  The patient therefore presents today for implantable loop implantation.     DESCRIPTION OF PROCEDURE:  Informed written consent was obtained, and the patient was brought to the electrophysiology lab in a fasting state.  The patient required no sedation for the procedure today.  Mapping over the patient's chest was performed by the EP lab staff to identify the area where electrograms were most prominent for ILR recording.  This area was found to be the left parasternal region over the 3rd-4th intercostal space. The patients left chest was therefore prepped and draped in the usual sterile fashion by the EP lab staff. The skin overlying the left parasternal region was infiltrated with lidocaine for local analgesia.  A 0.5-cm incision was made over the left parasternal region over the 3rd intercostal space.  A subcutaneous ILR pocket was fashioned using a combination of sharp and blunt dissection.  A Medtronic Reveal [place] model [id number] implantable loop recorder was then placed into the pocket  R waves were very prominent and measured 1.1 mV.EBL<72ml.  Steri- Strips and a sterile dressing were then applied.  There were no early apparent complications.     CONCLUSIONS:   1. Successful implantation of a Medtronic Reveal LINQ implantable loop recorder for recurrent unexplained syncope  2. No early apparent complications.    No driving x  6 months following a syncopal episode as per DMV's recommendation

## 2014-08-27 NOTE — Progress Notes (Signed)
Physical Therapy Treatment Patient Details Name: Julie Bass MRN: [id number] DOB: [birth year] Today's Date: 08/27/2014    History of Present Illness Patient is a 80 y/o female with PMH of hypertension, hyperlipidemia, peripheral vascular disease status post left SFA stent, and history of nonsustained VT presents with syncope. Chest XRAY- emphysema w/out acute cardiopulmonary process. Cardiac workup pending.    PT Comments    Patient progressing well with mobility. Tolerated stair negotiation today with Min guard assist for safety. Pt very slow to perform all transfers and mobility. Guarded gait pattern noted. Continue to recommend supervision at home for safety at d/c. Will continue to follow and progress as tolerated.   Follow Up Recommendations  Home health PT;Supervision for mobility/OOB     Equipment Recommendations  None recommended by PT    Recommendations for Other Services       Precautions / Restrictions Precautions Precautions: Fall Restrictions Weight Bearing Restrictions: No    Mobility  Bed Mobility Overal bed mobility: Modified Independent             General bed mobility comments: HOB elevated, use of rails. Increased time.  Transfers Overall transfer level: Needs assistance Equipment used: Rolling walker (2 wheeled) Transfers: Sit to/from Stand Sit to Stand: Min guard         General transfer comment: Min guard for safety. Slow movements for all transfers. Transferred on/off toilet with grab bar for assist.   Ambulation/Gait Ambulation/Gait assistance: Min guard Ambulation Distance (Feet): 200 Feet Assistive device: Rolling walker (2 wheeled) Gait Pattern/deviations: Step-through pattern;Decreased stride length;Trunk flexed Gait velocity: 1.1 ft/sec Gait velocity interpretation: <1.8 ft/sec, indicative of risk for recurrent falls General Gait Details: Very slow, steady gait. Stability/balance improved towards end of gait bout. VC to  increase gait speed to improve balance.    Stairs Stairs: Yes Stairs assistance: Min guard Stair Management: One rail Right;One rail Left;Alternating pattern Number of Stairs: 4 General stair comments: Min guard for safety. VC for technique.  Wheelchair Mobility    Modified Rankin (Stroke Patients Only)       Balance     Sitting balance-Leahy Scale: Good       Standing balance-Leahy Scale: Fair Standing balance comment: Able to perform dynamic standing activities -pericare, adjusting under garments/pads and washing hands without LOB. No UE support needed.                    Cognition Arousal/Alertness: Awake/alert Behavior During Therapy: WFL for tasks assessed/performed Overall Cognitive Status: No family/caregiver present to determine baseline cognitive functioning (Pt does not remember therapist coming by yesterday and ambulating with her in hallway. Not able to state date.)                      Exercises      General Comments General comments (skin integrity, edema, etc.): Pt eager to return home.       Pertinent Vitals/Pain Pain Assessment: No/denies pain    Home Living                      Prior Function            PT Goals (current goals can now be found in the care plan section) Progress towards PT goals: Progressing toward goals    Frequency  Min 3X/week    PT Plan Current plan remains appropriate    Co-evaluation  End of Session Equipment Utilized During Treatment: Gait belt Activity Tolerance: Patient tolerated treatment well Patient left: in chair;with call bell/phone within reach     Time: 7622-6333 PT Time Calculation (min): 34 min  Charges:  $Gait Training: 8-22 mins $Therapeutic Activity: 8-22 mins                    G CodesCandy Bass A 2014/09/17, 11:44 AM Julie Bass, [place], DPT [phone]

## 2014-08-27 NOTE — Progress Notes (Signed)
Patient Name: Julie Bass Date of Encounter: 08/27/2014  Principal Problem:   Syncope Active Problems:   PAD (peripheral artery disease)   Orthostatic hypotension   Physical deconditioning   Abdominal pain in female    Patient Profile: 80 yo female w/ hx HTN, HLD, PAD w/ Left SFA stent, NSVT, was admitted 10/09 w/ syncope.  SUBJECTIVE: Pt very weak, c/o left abd pain, denies N&V or diarrhea. Has for hours, has been having last pm and this am. Never gets palpitations. Grandson lives with her, he is gone most of the time, has 2 jobs.  OBJECTIVE Filed Vitals:   08/25/14 2057 08/26/14 0638 08/26/14 2059 08/27/14 0506  BP: 117/43 163/61 140/70 146/60  Pulse: 65 71 73 63  Temp: 98.9 F (37.2 C) 98.6 F (37 C) 98.6 F (37 C) 98.7 F (37.1 C)  TempSrc: Oral Oral Oral Oral  Resp: 18 18 18 18   Height:  5\' 3"  (1.6 m)    Weight:  124 lb 3.2 oz (56.337 kg)  124 lb 1.9 oz (56.3 kg)  SpO2: 94% 96% 99% 98%  BP- Lying 163/61 Pulse- Lying 71  BP- Sitting 149/60 Pulse- Sitting 68   BP- Standing at 0 minutes 140/64  Pulse- Standing at 0 minutes 75   Intake/Output Summary (Last 24 hours) at 08/27/14 0854 Last data filed at 08/27/14 0847  Gross per 24 hour  Intake    120 ml  Output      0 ml  Net    120 ml   Filed Weights   08/25/14 0514 08/26/14 0638 08/27/14 0506  Weight: 121 lb 4.1 oz (55 kg) 124 lb 3.2 oz (56.337 kg) 124 lb 1.9 oz (56.3 kg)    PHYSICAL EXAM General: Well developed, well nourished, female in no acute distress. Head: Normocephalic, atraumatic.  Neck: Supple without bruits, JVD not elevated. Lungs:  Resp regular and unlabored, CTA. Heart: RRR, S1, S2, no S3, S4, 3/6 murmur; no rub. Abdomen: Soft, diffusely tender, L>R, non-distended, BS + x 4.  Extremities: No clubbing, cyanosis, no edema.  Neuro: Alert and oriented X 3. Moves all extremities spontaneously. Psych: Normal affect.  LABS: Basic Metabolic Panel:  Recent Labs  08/25/14 1310  08/27/14 0352  NA  --  140  K  --  3.7  CL  --  103  CO2  --  27  GLUCOSE  --  110*  BUN  --  7  CREATININE  --  0.88  CALCIUM  --  9.4  MG 1.8  --    Lab Results  Component Value Date   AMYLASE 49 08/27/2014   Lab Results  Component Value Date   LIPASE 35 08/27/2014   TELE: SR, 6 bt run NSVT, 10 bts?atrial tach or PAF, PVCs and pairs.    Carotid Dopplers: 08/26/2014 Findings suggest 1-39% internal carotid artery stenosis bilaterally. The left vertebral artery is patent with antegrade flow. Unable to visualize the right vertebral artery   Current Medications:  . amLODipine  2.5 mg Oral Daily  . aspirin EC  81 mg Oral Daily  . cilostazol  50 mg Oral BID  . citalopram  10 mg Oral Daily  . clopidogrel  75 mg Oral Daily  . docusate sodium  200 mg Oral Daily  . ezetimibe  10 mg Oral Daily  . feeding supplement (ENSURE COMPLETE)  237 mL Oral TID BM  . heparin  5,000 Units Subcutaneous 3 times per day  . losartan  50 mg Oral Daily  . metoprolol tartrate  100 mg Oral Daily  . metoprolol tartrate  75 mg Oral q1800  . omega-3 acid ethyl esters  1 g Oral Daily  . pantoprazole  40 mg Oral Daily  . pravastatin  40 mg Oral q1800  . timolol  1 drop Both Eyes BID      ASSESSMENT AND PLAN: 1. Syncope-2 previous episodes of syncope before the one that brought to [facility]. Was orthostatic 2 days ago, BP was improved -but still with orthostatic changes. Still feels weak though she has not been out of bed Will get PT to see  Note she had 10 beats PSVT 10/11 (150bpm) and 6 bts 10/12, no sx. Hx of NSVT in past. On b blocker.  Echo in July 2015 with LVEF normal. Continue meds  She has had syncopal spells in past.  Carotid USN results above, further eval per MD troponins negative   2. Orthostatic Hypotension: This had improved with changes in medications and IV hydration. Still feels weak - seems generalized and acute on chronic. 10/12 orthostatic VS: lying 163/61, sitting 149/60 and  standing 140/64- though no dizziness with this. 10/13 results above, similar, HR increases 10-15 bts and SBP drops about 20 mmHg, but never low enough to cause loss of consciousness. Largest drop was 145/53 lying to 109/57 standing at 3 minutes on 10/10.  3. Deconditioning and Weakness: PT consultation recommends home health.  Patient lives with son who works during day and is frequently gone on the weekends. Has to be able to move around and care for herself.  4. PAD: Will check carotids- see results above.  5. Abd pain in female - per note last pm, chronic,  pt of [facility]. Amylase and Lipase OK, diffuse tenderness. Does not appear toxic. Discussed with Dr. Cristina Gong and will have her follow up with [facility] as outpatient for further discussion.   SignedRosaria Ferries , PA-C 8:54 AM 08/27/2014  Personally seen and examined. Agree with above. OK for DC home after implantable loop placed.  Changes to note made.  Candee Furbish, MD

## 2014-08-27 NOTE — Consult Note (Signed)
ELECTROPHYSIOLOGY CONSULT NOTE    Patient ID: Julie Bass MRN: [id number], DOB/AGE: [birth year] 80 y.o.  Admit date: 08/23/2014 Date of Consult: 08-27-14  Primary Physician: [name], MD Primary Cardiologist: Claiborne Billings  Reason for Consultation: syncope  HPI:  Julie Bass is a 80 y.o. female with a past medical history significant for hyperlipidemia, PAD, hypertension, and NSVT.  She was admitted on 10/9 following a syncopal spell at home. She had gone to the grocery store and returned home.  She was at her trunk getting ready to take the groceries into the house when she had an abrupt syncopal spell and awoke on the ground.  She had no prodrome.  Her last syncopal spell was 1 year ago.  At that time, she had eaten dinner, got up to put her plate in the sink and had an abrupt syncopal spell.    She has never had palpitations.  She denies chest pain, shortness of breath, lower extremity edema, dizziness, or pre-syncope.  She does have intermittent abdominal pain.  ROS is otherwise negative.   Echo 05-2014 demonstrated EF 56-43%, grade 1 diastolic dysfunction, no RWMA, moderately calcified mitral valve annulus, LA 26.   Orthostatics were done this admission and positive, but were without symptoms.   EP has been asked to evaluate for treatment options.   Past Medical History  Diagnosis Date  . Glaucoma   . Osteoporosis   . Hyperlipemia 10/18/2011  . PAD (peripheral artery disease) 10/18/2011    h/o left SFA stent  . HTN (hypertension) 10/18/2011  . NSVT (nonsustained ventricular tachycardia) 10/18/2011  . Depression   . Chronic kidney disease     KIDNEY STONES  . Hiatal hernia   . Arthritis   . Dementia     BEGINNING STAGES   . GERD (gastroesophageal reflux disease)      Surgical History:  Past Surgical History  Procedure Laterality Date  . Cholecystectomy    . Abdominal hysterectomy  1973  . Cardiac catheterization      2012  . Reverse shoulder  arthroplasty Right 01/26/2013    Procedure: RIGHT REVERSE TOTAL SHOULDER ARTHROPLASTY;  Surgeon: Augustin Schooling, MD;  Location: [facility];  Service: Orthopedics;  Laterality: Right;  . Transthoracic echocardiogram  10/2012    EF 75-70%, mod conc hypertrophy, severely calcified MV annulus, LA mildly dailted, PA peak pressure 32mmHg  . Nm myocar perf wall motion  10/2011    non-gated - normal stuy, EF 82%, normal LV wall motion  . Lower extremity arterial doppler  2013    right SFA with 50-69% diameter reduction, right PTA/peroneal occluded, L SFA prox to stent has narrowing with increased velocities >60% diameter reduction, L SFA stent patent, L ATA w/occlusive disease, bilat ABIs show mild arterial insuffiency at rest  . Lower extremity angiogram  11/02/2007    stent of mid left SFA with 6x152mm EV3 self-expanding stent and 6x3 in prox region (Dr. Adora Fridge)  . Lower extremity angiogram       Prescriptions prior to admission  Medication Sig Dispense Refill  . amLODipine (NORVASC) 5 MG tablet Take 1 tablet (5 mg total) by mouth daily.  90 tablet  3  . aspirin EC 81 MG tablet Take 81 mg by mouth daily.      . cilostazol (PLETAL) 50 MG tablet Take 50 mg by mouth 2 (two) times daily.      . citalopram (CELEXA) 10 MG tablet Take 1 tablet (10 mg total) by mouth daily.  90 tablet  3  . clopidogrel (PLAVIX) 75 MG tablet Take 1 tablet (75 mg total) by mouth daily.  90 tablet  3  . losartan (COZAAR) 100 MG tablet Take 1 tablet (100 mg total) by mouth daily.  90 tablet  3  . metoprolol (LOPRESSOR) 50 MG tablet Take 1.5-2 tablets (75-100 mg total) by mouth 2 (two) times daily. Take 100mg  in the morning and 75mg  in the evening  315 tablet  3  . omega-3 acid ethyl esters (LOVAZA) 1 G capsule Take 1 capsule (1 g total) by mouth daily.  90 capsule  3  . pantoprazole (PROTONIX) 40 MG tablet Take 1 tablet (40 mg total) by mouth daily.  90 tablet  3  . Pitavastatin Calcium (LIVALO) 2 MG TABS Take 1 tablet (2 mg  total) by mouth daily.  90 tablet  3  . timolol (TIMOPTIC) 0.5 % ophthalmic solution Place 1 drop into both eyes 2 (two) times daily.       Marland Kitchen ezetimibe (ZETIA) 10 MG tablet Take 1 tablet (10 mg total) by mouth daily.  90 tablet  3    Inpatient Medications:  . amLODipine  2.5 mg Oral Daily  . aspirin EC  81 mg Oral Daily  . cilostazol  50 mg Oral BID  . citalopram  10 mg Oral Daily  . clopidogrel  75 mg Oral Daily  . docusate sodium  200 mg Oral Daily  . ezetimibe  10 mg Oral Daily  . feeding supplement (ENSURE COMPLETE)  237 mL Oral TID BM  . heparin  5,000 Units Subcutaneous 3 times per day  . losartan  50 mg Oral Daily  . metoprolol tartrate  100 mg Oral Daily  . metoprolol tartrate  75 mg Oral q1800  . omega-3 acid ethyl esters  1 g Oral Daily  . pantoprazole  40 mg Oral Daily  . pravastatin  40 mg Oral q1800  . timolol  1 drop Both Eyes BID    Allergies:  Allergies  Allergen Reactions  . Crestor [Rosuvastatin Calcium]     Muscle pain  . Lipitor [Atorvastatin Calcium]     Muscle pain    History   Social History  . Marital Status: Widowed    Spouse Name: N/A    Number of Children: 4  . Years of Education: N/A   Occupational History  . Not on file.   Social History Main Topics  . Smoking status: Former Smoker    Quit date: 05/07/1984  . Smokeless tobacco: Never Used  . Alcohol Use: No  . Drug Use: No  . Sexual Activity: Not Currently   Other Topics Concern  . Not on file   Social History Narrative  . No narrative on file     Family History  Problem Relation Age of Onset  . Breast cancer Sister     cervical cancer  . Colon cancer Brother     throat cancer  . Throat cancer Father   . Cancer Mother   . CAD Son   . Pulmonary embolism Daughter   . Hypertension Daughter     BP 146/60  Pulse 63  Temp(Src) 98.7 F (37.1 C) (Oral)  Resp 18  Ht 5\' 3"  (1.6 m)  Wt 124 lb 1.9 oz (56.3 kg)  BMI 21.99 kg/m2  SpO2 98%  Physical Exam: Filed Vitals:    08/25/14 2057 08/26/14 0638 08/26/14 2059 08/27/14 0506  BP: 117/43 163/61 140/70 146/60  Pulse: 65 71 73  63  Temp: 98.9 F (37.2 C) 98.6 F (37 C) 98.6 F (37 C) 98.7 F (37.1 C)  TempSrc: Oral Oral Oral Oral  Resp: 18 18 18 18   Height:  5\' 3"  (1.6 m)    Weight:  124 lb 3.2 oz (56.337 kg)  124 lb 1.9 oz (56.3 kg)  SpO2: 94% 96% 99% 98%    GEN- The patient is elderly appearing, alert and oriented x 3 today.   Head- normocephalic, atraumatic Eyes-  Sclera clear, conjunctiva pink Ears- hearing intact Oropharynx- clear Neck- supple carotid massage maneuver is negative Lungs- Clear to ausculation bilaterally, normal work of breathing Heart- Regular rate and rhythm  GI- soft, NT, ND, + BS Extremities- no clubbing, cyanosis, or edema  MS- no significant deformity or atrophy Skin- no rash or lesion Psych- euthymic mood, full affect Neuro- strength and sensation are intact    Labs:   Lab Results  Component Value Date   WBC 13.3* 08/23/2014   HGB 14.4 08/23/2014   HCT 43.0 08/23/2014   MCV 92.5 08/23/2014   PLT 172 08/23/2014    Recent Labs Lab 08/23/14 1315  08/27/14 0352  NA 141  < > 140  K 4.3  < > 3.7  CL 104  < > 103  CO2 25  < > 27  BUN 13  < > 7  CREATININE 0.95  < > 0.88  CALCIUM 9.7  < > 9.4  PROT 7.5  --   --   BILITOT 0.9  --   --   ALKPHOS 45  --   --   ALT 14  --   --   AST 28  --   --   GLUCOSE 116*  < > 110*  < > = values in this interval not displayed.  Radiology/Studies: Dg Chest Portable 1 View 08/23/2014   CLINICAL DATA:  Initial encounter for loss of consciousness today with right shoulder pain.  EXAM: PORTABLE CHEST - 1 VIEW  COMPARISON:  01/24/2013  FINDINGS: 1318 hrs. Lower face obscures lung apices. Hyper expansion is consistent with emphysema. The lungs are clear without focal infiltrate, edema, pneumothorax or pleural effusion. The cardiopericardial silhouette is within normal limits for size. Imaged bony structures of the thorax are intact.  Patient is status post right shoulder replacement. Telemetry leads overlie the chest.  IMPRESSION: Emphysema without acute cardiopulmonary process.   Electronically Signed   By: Misty Stanley M.D.   On: 08/23/2014 13:47   Dg Shoulder Right Port 08/23/2014   CLINICAL DATA:  Pain post trauma; total shoulder replacement on the right  EXAM: PORTABLE RIGHT SHOULDER - 2+ VIEW  COMPARISON:  January 26, 2013  FINDINGS: Internal rotation, external rotation, and Y scapular images were obtained. Patient is status post reverse right shoulder arthroplasty. Prosthetic components appear well seated. No acute fracture or dislocation. There is bony overgrowth along the lateral olecranon inferiorly. No erosive change.  IMPRESSION: Prosthetic components appear well seated. No fracture or dislocation.   Electronically Signed   By: Lowella Grip M.D.   On: 08/23/2014 13:46    KCL:EXNTZ rhythm, rate 71, 1st degree AV block (PR 236), PVC, chronic lateral ST-T changes  TELEMETRY: sinus rhythm with short runs NSVT, runs short RP tachycardia  A/P:  1. Syncope The patient has recurrent unexplained syncope.  She has a preserved EF and no structural heart disease.  She has been observed to have both nonsustained SVT and NSVT on telemetry.  She has a first degree  AV block on resting ekg but no other AV block on telemetry.  She has previously worn an event monitor during which she did not have syncope.  I would therefore recommend implantable loop recorder implant at this time.  Risks, benefits, and alternatives to ILR implant were discussed with the patient who wishes to proceed. No driving x 6 months.

## 2014-08-27 NOTE — Care Management Note (Signed)
    Page 1 of 1   08/27/2014     3:46:10 PM CARE MANAGEMENT NOTE 08/27/2014  Patient:  Julie Bass, Julie Bass   Account Number:  [id number]  Date Initiated:  08/27/2014  Documentation initiated by:  GRAVES-BIGELOW,Ithan Touhey  Subjective/Objective Assessment:   Pt admitted for syncope. Per pt she is from home alone. Pt has suppoort of daughters and she has neighbors to check in. Per pt she has a lifre alert button.     Action/Plan:   CM did speak to pt in ref to [facility] services and pt chose [facility]- CM did make referral for services and SOC to beign within 24-48 hrs post d/c.   Anticipated DC Date:  08/27/2014   Anticipated DC Plan:  [place]  CM consult      [facility] Choice  HOME HEALTH   Choice offered to / List presented to:  C-1 Patient        [place] arranged  [place] [facility]      [place].   Status of service:  Completed, signed off Medicare Important Message given?  YES (If response is "NO", the following Medicare IM given date fields will be blank) Date Medicare IM given:  08/27/2014 Medicare IM given by:  GRAVES-BIGELOW,Adalene Gulotta Date Additional Medicare IM given:   Additional Medicare IM given by:    Discharge Disposition:  [place]  Per UR Regulation:  Reviewed for med. necessity/level of care/duration of stay  If discussed at [place] of Stay Meetings, dates discussed:    Comments:

## 2014-08-28 NOTE — Discharge Summary (Signed)
Personally seen and examined. Agree with above. Lanson Randle, MD  

## 2014-08-30 ENCOUNTER — Telehealth: Payer: Self-pay | Admitting: *Deleted

## 2014-08-30 ENCOUNTER — Other Ambulatory Visit: Payer: Self-pay | Admitting: *Deleted

## 2014-08-30 NOTE — Telephone Encounter (Signed)
Faxed FLMA for grandson, Marrianne Mood. To [facility] @ [phone].

## 2014-08-30 NOTE — Telephone Encounter (Signed)
Faxed FLMA form to [facility] [employer] [facility] @ [phone].

## 2014-09-04 ENCOUNTER — Ambulatory Visit: Payer: Medicare Other | Admitting: Cardiology

## 2014-09-04 ENCOUNTER — Encounter: Payer: Self-pay | Admitting: Cardiovascular Disease

## 2014-09-04 ENCOUNTER — Ambulatory Visit (INDEPENDENT_AMBULATORY_CARE_PROVIDER_SITE_OTHER): Payer: Medicare Other | Admitting: Cardiovascular Disease

## 2014-09-04 VITALS — BP 150/73 | HR 67 | Ht 60.0 in | Wt 125.1 lb

## 2014-09-04 DIAGNOSIS — I10 Essential (primary) hypertension: Secondary | ICD-10-CM

## 2014-09-04 DIAGNOSIS — I472 Ventricular tachycardia: Secondary | ICD-10-CM

## 2014-09-04 DIAGNOSIS — E785 Hyperlipidemia, unspecified: Secondary | ICD-10-CM

## 2014-09-04 DIAGNOSIS — I4729 Other ventricular tachycardia: Secondary | ICD-10-CM

## 2014-09-04 DIAGNOSIS — I739 Peripheral vascular disease, unspecified: Secondary | ICD-10-CM

## 2014-09-04 DIAGNOSIS — R55 Syncope and collapse: Secondary | ICD-10-CM

## 2014-09-04 NOTE — Patient Instructions (Addendum)
Your physician recommends that you schedule a follow-up appointment in: 4 weeks with Dr. Claiborne Billings. Your physician has recommended you make the following change in your medication: stop the pletal.

## 2014-09-04 NOTE — Progress Notes (Signed)
Patient ID: Julie Bass, female   DOB: 10-27-1926, 80 y.o.   MRN: [id number]     HPI: Julie Bass is a 80 y.o. female who presents to the office today in followup of her recent hospitalization for syncopal spell for which she underwent loop recorder implantation.  Julie Bass has a history of hypertension, hyperlipidemia, peripheral vascular disease, as well as episodes of nonsustained ventricular tachycardia, for, which she's been treated with beta blocker therapy.  An echo Doppler study in December 2013 showed hyperdynamic LV function with an ejection fraction of at least 40%, grade 1 diastolic dysfunction, and dynamic obstruction in the mid cavity of the left ventricle with a gradient of 10 mm.  She had severely calcified mitral annulus and mild left atrial dilatation.  She has documented nonsustained ventricular tachycardia, which was picked up on a remote event monitor, and she has tolerated titration of her beta blocker therapy to metoprolol 100 mg in the morning and 75 mg at night with resolution of symptoms.  She also has had difficulty with statin intolerance for hyperlipidemia, but has tolerated live alone, 2 mg daily.  She also has history of hypertension and has been on losartan 100 mg in addition to her metoprolol.  She has peripheral vascular disease and is status post stenting of her left SFA in the past.  She has one vessel runoff bilaterally and has moderate right SFA disease.  Her last LE Doppler study was in August 2013 , which showed occluded right PTA and peroneal occluded left PTA, left ATA with reconstitution of the dorsalis pedis artery.  ABIs were 0.88 on the right and 0.86 on the left.  She has been on aspirin 81 mg, Plavix 75 mg in addition to Pletal 50 mg.  I had seen her last in June 2015 and at that time, she was doing well and was unaware of any palpitations.  On her medical regimen.  She was scheduled for a 2 year followup echo Doppler study , which was done on  05/15/2014 and showed an ejection fraction of 65-70% and there was grade 1 diastolic dysfunction.  There was moderate mitral annular calcification.  There was no mention of any dynamic obstruction.  Lower extremity Doppler studies revealed a patent left SFA stent with prior disease noted in several vessels, not significantly changed from 2013.  She recently was hospitalized on 08/23/2014 after developing a syncopal spell.  She was not significantly orthostatic to her hospitalization.  Upon presentation her blood pressure was 170/58.  Her ECG showed sinus rhythm with mild ST changes laterally, and first degree heart block.  She was noted to have a single 15 beat run of nonsustained VT.  She ultimately underwent implantation of a loop recorder on 08/27/2014.  She underwent carotid duplex imaging and findings suggest 1-39% internal carotid artery stenosis bilaterally. The left vertebral artery is patent with antegrade flow but the the right vertebral artery was not able to be visualized.  After discharge, the loop recorder detected 2 episodes of questionable transient asystole.  However, upon review of these tracings with Dr. Sallyanne Kuster it appears that these may very well be artifactual.  One episode was recorded on October 14, and another on October 16.  The episode in October 16 tach, rate of less than over 30 seconds.  The patient was with her daughter, who is my nurse at the time.  She denies any symptoms at this time.  Specifically, she denied any lightheadedness, and one would expect  that with a >30 second pause she would have been significantly symptomatic and this would have also been noted by her daughter.  Past Medical History  Diagnosis Date  . Glaucoma   . Osteoporosis   . Hyperlipemia 10/18/2011  . PAD (peripheral artery disease) 10/18/2011    h/o left SFA stent  . HTN (hypertension) 10/18/2011  . NSVT (nonsustained ventricular tachycardia) 10/18/2011  . Depression   . Chronic kidney disease      KIDNEY STONES  . Hiatal hernia   . Arthritis   . Dementia     BEGINNING STAGES   . GERD (gastroesophageal reflux disease)     Past Surgical History  Procedure Laterality Date  . Cholecystectomy    . Abdominal hysterectomy  1973  . Cardiac catheterization      2012  . Reverse shoulder arthroplasty Right 01/26/2013    Procedure: RIGHT REVERSE TOTAL SHOULDER ARTHROPLASTY;  Surgeon: Augustin Schooling, MD;  Location[place];  Service: Orthopedics;  Laterality: Right;  . Transthoracic echocardiogram  10/2012    EF 75-70%, mod conc hypertrophy, severely calcified MV annulus, LA mildly dailted, PA peak pressure 47mmHg  . Nm myocar perf wall motion  10/2011    non-gated - normal stuy, EF 82%, normal LV wall motion  . Lower extremity arterial doppler  2013    right SFA with 50-69% diameter reduction, right PTA/peroneal occluded, L SFA prox to stent has narrowing with increased velocities >60% diameter reduction, L SFA stent patent, L ATA w/occlusive disease, bilat ABIs show mild arterial insuffiency at rest  . Lower extremity angiogram  11/02/2007    stent of mid left SFA with 6x192mm EV3 self-expanding stent and 6x3 in prox region (Dr. Adora Fridge)  . Lower extremity angiogram      Allergies  Allergen Reactions  . Crestor [Rosuvastatin Calcium]     Muscle pain  . Lipitor [Atorvastatin Calcium]     Muscle pain    Current Outpatient Prescriptions  Medication Sig Dispense Refill  . amLODipine (NORVASC) 5 MG tablet Take 0.5 tablets (2.5 mg total) by mouth daily.  90 tablet  3  . aspirin EC 81 MG tablet Take 81 mg by mouth daily.      . citalopram (CELEXA) 10 MG tablet Take 1 tablet (10 mg total) by mouth daily.  90 tablet  3  . clopidogrel (PLAVIX) 75 MG tablet Take 1 tablet (75 mg total) by mouth daily.  90 tablet  3  . ezetimibe (ZETIA) 10 MG tablet Take 1 tablet (10 mg total) by mouth daily.  90 tablet  3  . feeding supplement, ENSURE COMPLETE, (ENSURE COMPLETE) LIQD Take 237 mLs by mouth 3  (three) times daily between meals.      Marland Kitchen losartan (COZAAR) 100 MG tablet Take 1 tablet (100 mg total) by mouth daily.  90 tablet  3  . metoprolol (LOPRESSOR) 50 MG tablet Take by mouth. Take 100mg  in the morning and 75mg  in the evening      . omega-3 acid ethyl esters (LOVAZA) 1 G capsule Take 1 capsule (1 g total) by mouth daily.  90 capsule  3  . pantoprazole (PROTONIX) 40 MG tablet Take 1 tablet (40 mg total) by mouth daily.  90 tablet  3  . Pitavastatin Calcium (LIVALO) 2 MG TABS Take 1 tablet (2 mg total) by mouth daily.  90 tablet  3  . timolol (TIMOPTIC) 0.5 % ophthalmic solution Place 1 drop into both eyes 2 (two) times daily.  No current facility-administered medications for this visit.    History   Social History  . Marital Status: Widowed    Spouse Name: N/A    Number of Children: 4  . Years of Education: N/A   Occupational History  . Not on file.   Social History Main Topics  . Smoking status: Former Smoker    Quit date: 05/07/1984  . Smokeless tobacco: Never Used  . Alcohol Use: No  . Drug Use: No  . Sexual Activity: Not Currently   Other Topics Concern  . Not on file   Social History Narrative  . No narrative on file    Family History  Problem Relation Age of Onset  . Breast cancer Sister     cervical cancer  . Colon cancer Brother     throat cancer  . Throat cancer Father   . Cancer Mother   . CAD Son   . Pulmonary embolism Daughter   . Hypertension Daughter     ROS General: Negative; No fevers, chills, or night sweats; positive for fatigue  HEENT: Negative; No changes in vision or hearing, sinus congestion, difficulty swallowing Pulmonary: Negative; No cough, wheezing, shortness of breath, hemoptysis Cardiovascular: See HPI:  Positive for claudication GI: Positive for GERD; No nausea, vomiting, diarrhea, or abdominal pain GU: Negative; No dysuria, hematuria, or difficulty voiding Musculoskeletal: Negative; no myalgias, joint pain, or  weakness Hematologic: Negative; no easy bruising, bleeding Endocrine: Negative; no heat/cold intolerance; no diabetes, Neuro: Negative; no changes in balance, headaches Skin: Negative; No rashes or skin lesions Psychiatric: Negative; No behavioral problems, depression Sleep: Positive for daytime sleepiness, hypersomnolence, no bruxism, restless legs, hypnogognic hallucinations. Other comprehensive 14 point system review is negative   Physical Exam BP 150/73  Pulse 67  Ht 5' (1.524 m)  Wt 125 lb 1.6 oz (56.745 kg)  BMI 24.43 kg/m2 General: Alert, oriented, no distress.  Skin: normal turgor, no rashes, warm and dry HEENT: Normocephalic, atraumatic. Pupils equal round and reactive to light; sclera anicteric; extraocular muscles intact, No lid lag; Nose without nasal septal hypertrophy; Mouth/Parynx benign; Mallinpatti scale 2 Neck: No JVD, no carotid bruits; normal carotid upstroke Lungs: clear to ausculatation and percussion bilaterally; no wheezing or rales, normal inspiratory and expiratory effort Chest wall: without tenderness to palpitation Heart: PMI not displaced, RRR, s1 s2 normal, 2/6 systolic murmur left sternal border, No diastolic murmur, no rubs, gallops, thrills, or heaves Abdomen: soft, nontender; no hepatosplenomehaly, BS+; abdominal aorta nontender and not dilated by palpation. Back: no CVA tenderness Pulses: 2+ upper extremity.  1+ distally  Musculoskeletal: full range of motion, normal strength, no joint deformities Extremities: Pulses 2+, no clubbing cyanosis or edema, Homan's sign negative  Neurologic: grossly nonfocal; Cranial nerves grossly wnl Psychologic: Normal mood and affect   ECG (independently read by me) normal sinus rhythm at 66 beats per minute.  First degree AV block with a PR interval at 236 ms.  June 2015 ECG (independently read by me): Sinus bradycardia 59 beats per minute.  First degree AV block with PR interval 240 ms.  Nonspecific ST-T  changes.  LABS:  BMET    Component Value Date/Time   NA 140 08/27/2014 0352   K 3.7 08/27/2014 0352   CL 103 08/27/2014 0352   CO2 27 08/27/2014 0352   GLUCOSE 110* 08/27/2014 0352   BUN 7 08/27/2014 0352   CREATININE 0.88 08/27/2014 0352   CREATININE 1.14* 05/15/2014 0905   CALCIUM 9.4 08/27/2014 0352   GFRNONAA 58*  08/27/2014 0352   GFRAA 67* 08/27/2014 0352     Hepatic Function Panel     Component Value Date/Time   PROT 7.5 08/23/2014 1315   ALBUMIN 4.0 08/23/2014 1315   AST 28 08/23/2014 1315   ALT 14 08/23/2014 1315   ALKPHOS 45 08/23/2014 1315   BILITOT 0.9 08/23/2014 1315     CBC    Component Value Date/Time   WBC 13.3* 08/23/2014 1315   RBC 4.65 08/23/2014 1315   HGB 14.4 08/23/2014 1315   HCT 43.0 08/23/2014 1315   PLT 172 08/23/2014 1315   MCV 92.5 08/23/2014 1315   MCH 31.0 08/23/2014 1315   MCHC 33.5 08/23/2014 1315   RDW 12.9 08/23/2014 1315   LYMPHSABS 1.4 08/23/2014 1315   MONOABS 0.8 08/23/2014 1315   EOSABS 0.1 08/23/2014 1315   BASOSABS 0.0 08/23/2014 1315     BNP    Component Value Date/Time   PROBNP 93.7 10/18/2011 2002    Lipid Panel     Component Value Date/Time   CHOL 215* 08/24/2014 0120     RADIOLOGY: Mm Digital Screening Bilateral  04/25/2014   CLINICAL DATA:  Screening.  EXAM: DIGITAL SCREENING BILATERAL MAMMOGRAM WITH CAD  COMPARISON:  02/17/2012, 09/22/2010.  ACR Breast Density Category b: There are scattered areas of fibroglandular density.  FINDINGS: In the right breast, skin thickening warrants further evaluation with physical examination and possibly ultrasound. In the left breast, no findings suspicious for malignancy. Images were processed with CAD.  IMPRESSION: Further evaluation is suggested for possible skin thickening in the right breast.  RECOMMENDATION: Physical examination and possibly ultrasound of the right breast. (Code:FI-R-89M)  The patient will be contacted regarding the findings, and additional imaging will be scheduled.   BI-RADS CATEGORY  0: Incomplete. Need additional imaging evaluation and/or prior mammograms for comparison.   Electronically Signed   By: Luberta Robertson M.D.   On: 04/25/2014 07:41   US Breast Ltd Uni Right Inc Axilla  05/08/2014   CLINICAL DATA:  Screening callback for right breast skin thickening asymmetrically in the inferior breast. The patient denies a history of congestive heart failure. She had right shoulder surgery approximately 1 year ago but denies right arm asymmetric edema.  EXAM: ULTRASOUND OF THE right BREAST  COMPARISON:  Prior mammograms  FINDINGS: On physical exam, I palpate no abnormality in the left breast 6 o'clock/ inferior breast. There is no skin thickening, induration, or erythema.  Ultrasound is performed, showing minimal skin thickening in the right inferior breast measuring 3 mm as compared to the right breast 12 o'clock location measuring 2 mm in maximal thickness. No underlying intramammary abnormality is identified.  IMPRESSION: Minimal asymmetric prominence of the right inferior breast skin without underlying intramammary abnormality. There is no evidence for active infection or other dermatologic abnormality. This is of unclear clinical significance and clinical followup is recommended as part of the patient's routine healthcare maintenance.  RECOMMENDATION: Screening mammogram in one year.(Code:SM-B-01Y)  I have discussed the findings and recommendations with the patient. Results were also provided in writing at the conclusion of the visit. If applicable, a reminder letter will be sent to the patient regarding the next appointment.  BI-RADS CATEGORY  1: Negative.   Electronically Signed   By: Conchita Paris M.D.   On: 05/08/2014 15:34      ASSESSMENT AND PLAN: JulieSkoda is an 80 year old female with a documented history of hypertension, moderate, concentric left ventricular hypertrophy with LVOT gradient at the midcavity level of 10  mm at her previous echo evaluation in  2013 which was not detected on her most recent echo study.  She has severely calcified mitral annular calcification and mild pulmonary hypertension.  She recently had a syncopal spell.  In the past.  She has been noted to have episodes of nonsustained ventricular tachycardia, which have been controlled with beta blocker therapy.  A recent hospitalization one episode of nonsustained VT was detected.  There were no significant episodes of bradycardia noted.  She was not significantly orthostatic.  I reviewed her loop recorder tracings since her discharge.  I agree with Dr.Croitoru that most likely these 2 recordings are artifactual particularly by virtue of the fact that the ECG after the pause is identical to the telemetry prior to the pause without any rhythm change leading up to restoration of normal rhythm.  In addition, the patient was totally asymptomatic despite this pause of greater than 30 seconds.  Presently, on exam today, she is non-orthostatic and her blood pressure was 150/74 supine and 150/70 standing.  I have recommended continued loop recorder monitoring rather than immediate pacemaker referral.  She is undergoing physical therapy 2 times per week.  I did review her laboratory from hospital and her hospital records.  I will see her in followup in 4-6 weeks or sooner if problems arise.   Troy Sine, MD, [facility]  09/04/2014 7:02 PM

## 2014-09-09 ENCOUNTER — Ambulatory Visit: Payer: Medicare Other

## 2014-09-12 ENCOUNTER — Telehealth: Payer: Self-pay | Admitting: *Deleted

## 2014-09-12 NOTE — Telephone Encounter (Signed)
Returned Pensions consultant in reference to patient's physical therapy start date to [facility].

## 2014-09-12 NOTE — Telephone Encounter (Signed)
Returned patient's  Certification and plan of care certification to [facility].

## 2014-09-19 ENCOUNTER — Encounter: Payer: Self-pay | Admitting: Internal Medicine

## 2014-09-19 ENCOUNTER — Other Ambulatory Visit: Payer: Self-pay | Admitting: Gastroenterology

## 2014-09-19 DIAGNOSIS — R1084 Generalized abdominal pain: Secondary | ICD-10-CM

## 2014-09-20 ENCOUNTER — Other Ambulatory Visit: Payer: Self-pay | Admitting: *Deleted

## 2014-09-20 DIAGNOSIS — E876 Hypokalemia: Secondary | ICD-10-CM

## 2014-09-25 ENCOUNTER — Ambulatory Visit
Admission: RE | Admit: 2014-09-25 | Discharge: 2014-09-25 | Disposition: A | Discharge status: Home / Self Care | Payer: Medicare Other | Source: Ambulatory Visit | Attending: Gastroenterology | Admitting: Gastroenterology

## 2014-09-25 DIAGNOSIS — R1084 Generalized abdominal pain: Secondary | ICD-10-CM

## 2014-09-25 MED ORDER — IOHEXOL 350 MG/ML SOLN
100.0000 mL | Freq: Once | INTRAVENOUS | Status: AC | PRN
  Administered 2014-09-25: 100 mL via INTRAVENOUS

## 2014-09-26 ENCOUNTER — Ambulatory Visit (INDEPENDENT_AMBULATORY_CARE_PROVIDER_SITE_OTHER): Payer: Medicare Other | Admitting: *Deleted

## 2014-09-26 DIAGNOSIS — R55 Syncope and collapse: Secondary | ICD-10-CM

## 2014-09-26 LAB — BASIC METABOLIC PANEL WITH GFR
BUN: 17 mg/dL (ref 6–23)
CO2: 26 meq/L (ref 19–32)
Calcium: 10.4 mg/dL (ref 8.4–10.5)
Chloride: 102 meq/L (ref 96–112)
Creat: 0.95 mg/dL (ref 0.50–1.10)
Glucose, Bld: 80 mg/dL (ref 70–99)
Potassium: 4.9 meq/L (ref 3.5–5.3)
Sodium: 140 meq/L (ref 135–145)

## 2014-09-27 LAB — MDC_IDC_ENUM_SESS_TYPE_REMOTE
Date Time Interrogation Session: 20151014010424
Zone Setting Detection Interval: 2000 ms
Zone Setting Detection Interval: 3000 ms
Zone Setting Detection Interval: 420 ms

## 2014-10-02 NOTE — Progress Notes (Signed)
Loop recorder 

## 2014-10-08 ENCOUNTER — Ambulatory Visit (INDEPENDENT_AMBULATORY_CARE_PROVIDER_SITE_OTHER): Payer: Medicare Other | Admitting: Cardiovascular Disease

## 2014-10-08 ENCOUNTER — Encounter: Payer: Self-pay | Admitting: Cardiovascular Disease

## 2014-10-08 VITALS — BP 180/70 | HR 59 | Ht 62.0 in | Wt 127.1 lb

## 2014-10-08 DIAGNOSIS — I10 Essential (primary) hypertension: Secondary | ICD-10-CM

## 2014-10-08 DIAGNOSIS — I951 Orthostatic hypotension: Secondary | ICD-10-CM

## 2014-10-08 DIAGNOSIS — E785 Hyperlipidemia, unspecified: Secondary | ICD-10-CM

## 2014-10-08 DIAGNOSIS — N200 Calculus of kidney: Secondary | ICD-10-CM

## 2014-10-08 DIAGNOSIS — R300 Dysuria: Secondary | ICD-10-CM

## 2014-10-08 DIAGNOSIS — I472 Ventricular tachycardia: Secondary | ICD-10-CM

## 2014-10-08 DIAGNOSIS — I4729 Other ventricular tachycardia: Secondary | ICD-10-CM

## 2014-10-08 DIAGNOSIS — I739 Peripheral vascular disease, unspecified: Secondary | ICD-10-CM

## 2014-10-08 MED ORDER — AMLODIPINE BESYLATE 2.5 MG PO TABS: 2.5000 mg | ORAL_TABLET | Freq: Every day | ORAL | Status: DC

## 2014-10-08 NOTE — Patient Instructions (Signed)
Your physician has recommended you make the following change in your medication: decrease the lopressor to 75 mg twice a day.  Your physician recommends that you return for a urinalysis.  Your physician wants you to follow-up in: 6 months or sooner if needed. With Dr. Claiborne Billings.You will receive a reminder letter in the mail two months in advance. If you don't receive a letter, please call our office to schedule the follow-up appointment.

## 2014-10-08 NOTE — Progress Notes (Signed)
Patient ID: Julie Bass, female   DOB: Jun 11, 1926, 80 y.o.   MRN: [id number]     HPI: Julie Bass is a 80 y.o. female who presents to the office today in followup of her recent hospitalization for syncopal spell for which she underwent loop recorder implantation.  Julie Bass has a history of hypertension, hyperlipidemia, peripheral vascular disease, as well as episodes of nonsustained ventricular tachycardia, for, which she's been treated with beta blocker therapy.  An echo Doppler study in December 2013 showed hyperdynamic LV function with an ejection fraction of at least 44%, grade 1 diastolic dysfunction, and dynamic obstruction in the mid cavity of the left ventricle with a gradient of 10 mm.  She had severely calcified mitral annulus and mild left atrial dilatation.  She has documented nonsustained ventricular tachycardia, which was picked up on a remote event monitor, and she has tolerated titration of her beta blocker therapy to metoprolol 100 mg in the morning and 75 mg at night with resolution of symptoms.  She also has had difficulty with statin intolerance for hyperlipidemia, but has tolerated live alone, 2 mg daily.  She also has history of hypertension and has been on losartan 100 mg in addition to her metoprolol.  She has peripheral vascular disease and is status post stenting of her left SFA in the past.  She has one vessel runoff bilaterally and has moderate right SFA disease.  Her last LE Doppler study was in August 2013 , which showed occluded right PTA and peroneal occluded left PTA, left ATA with reconstitution of the dorsalis pedis artery.  ABIs were 0.88 on the right and 0.86 on the left.  She has been on aspirin 81 mg, Plavix 75 mg in addition to Pletal 50 mg.  I had seen her last in June 2015 and at that time, she was doing well and was unaware of any palpitations.  On her medical regimen.  She was scheduled for a 2 year followup echo Doppler study , which was done on  05/15/2014 and showed an ejection fraction of 65-70% and there was grade 1 diastolic dysfunction.  There was moderate mitral annular calcification.  There was no mention of any dynamic obstruction.  Lower extremity Doppler studies revealed a patent left SFA stent with prior disease noted in several vessels, not significantly changed from 2013.  She recently was hospitalized on 08/23/2014 after developing a syncopal spell.  She was not significantly orthostatic to her hospitalization.  Upon presentation her blood pressure was 170/58.  Her ECG showed sinus rhythm with mild ST changes laterally, and first degree heart block.  She was noted to have a single 15 beat run of nonsustained VT.  She ultimately underwent implantation of a loop recorder on 08/27/2014.  She underwent carotid duplex imaging and findings suggest 1-39% internal carotid artery stenosis bilaterally. The left vertebral artery is patent with antegrade flow but the the right vertebral artery was not able to be visualized.  After discharge, the loop recorder detected 2 episodes of questionable transient asystole.  Upon review of these tracings with Dr. Sallyanne Kuster it appears that these may very well be artifactual.  One episode was recorded on October 14, and another on October 16.  The episode in October 16 tach, rate of less than over 30 seconds.  The patient was with her daughter, who is my nurse at the time.  She denies any symptoms at this time.  Specifically, she denied any lightheadedness, and one would expect that  with a >30 second pause she would have been significantly symptomatic and this would have also been noted by her daughter.  Since I last saw her, Julie Bass has felt improved.  She denies any major episodes of dizziness but if she gets up quickly or lies down fast.  She does experience transient episodes.  She is now having Ensure to supplement her appetite which is reduced.  She had undergone a CT of her abdomen which showed  atherosclerotic plaque throughout her abdominal aorta with suggestion of a tiny 0.5 x 0.2 cm atherosclerotic ulcer.  She was also noted to have plaque that was irregular throughout the right superficial femoral artery and also plaque in the popliteal artery, not resulting in hemodynamically significance stenosis.  She had single-vessel runoff to the right lower leg and foot.  3.  The right anterior tibial artery with atretic reconstitution of both posterior tibial and peroneal vascular distributions.  The right dorsalis pedis artery was patent to the level of the forefoot.  She had stents noted in the distal aspect of her left common femoral artery and single vessel runoff to the left lower leg and foot.  There was occlusion of both the left anterior, posterior tibial arteries.  The peroneal artery was found to reconstitute a dorsalis pedis artery at the level of the hindfoot.  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    Smart denies chest pain.  She denies any awareness of palpitations.  She denies any change in claudication symptoms and is maintained on aspirin and Plavix.  She has hyperlipidemia and continues to tolerate little low 2 mg in addition to Zetia 10 mg.  She has been on metoprolol 100 mg in the morning and 75 Mill grams in the evening in addition to losartan 100 mg and amlodipine 2.5 mg for blood pressure.  Past Medical History  Diagnosis Date  . Glaucoma   . Osteoporosis   . Hyperlipemia 10/18/2011  . PAD (peripheral artery disease) 10/18/2011    h/o left SFA stent  . HTN (hypertension) 10/18/2011  . NSVT (nonsustained ventricular tachycardia) 10/18/2011  . Depression   . Chronic kidney disease     KIDNEY STONES  . Hiatal hernia   . Arthritis   . Dementia     BEGINNING STAGES   . GERD (gastroesophageal reflux disease)     Past Surgical History  Procedure Laterality Date  . Cholecystectomy     . Abdominal hysterectomy  1973  . Cardiac catheterization      2012  . Reverse shoulder arthroplasty Right 01/26/2013    Procedure: RIGHT REVERSE TOTAL SHOULDER ARTHROPLASTY;  Surgeon: Augustin Schooling, MD;  Location: [facility];  Service: Orthopedics;  Laterality: Right;  . Transthoracic echocardiogram  10/2012    EF 75-70%, mod conc hypertrophy, severely calcified MV annulus, LA mildly dailted, PA peak pressure 57mmHg  . Nm myocar perf wall motion  10/2011    non-gated - normal stuy, EF 82%, normal LV wall motion  . Lower extremity arterial doppler  2013    right SFA with 50-69% diameter reduction, right PTA/peroneal occluded, L SFA prox to stent has narrowing with increased velocities >60% diameter reduction, L SFA stent patent, L ATA w/occlusive disease, bilat ABIs show mild arterial insuffiency at rest  . Lower extremity angiogram  11/02/2007    stent of mid left SFA with 6x139mm EV3 self-expanding stent and 6x3 in prox region (  Dr. Adora Fridge)  . Lower extremity angiogram      Allergies  Allergen Reactions  . Crestor [Rosuvastatin Calcium]     Muscle pain  . Lipitor [Atorvastatin Calcium]     Muscle pain    Current Outpatient Prescriptions  Medication Sig Dispense Refill  . amLODipine (NORVASC) 2.5 MG tablet Take 1 tablet (2.5 mg total) by mouth daily. 90 tablet 3  . aspirin EC 81 MG tablet Take 81 mg by mouth daily.    . citalopram (CELEXA) 10 MG tablet Take 1 tablet (10 mg total) by mouth daily. 90 tablet 3  . clopidogrel (PLAVIX) 75 MG tablet Take 1 tablet (75 mg total) by mouth daily. 90 tablet 3  . ezetimibe (ZETIA) 10 MG tablet Take 1 tablet (10 mg total) by mouth daily. 90 tablet 3  . feeding supplement, ENSURE COMPLETE, (ENSURE COMPLETE) LIQD Take 237 mLs by mouth 3 (three) times daily between meals.    Marland Kitchen losartan (COZAAR) 100 MG tablet Take 1 tablet (100 mg total) by mouth daily. 90 tablet 3  . metoprolol (LOPRESSOR) 50 MG tablet Take by mouth. Take 75 mg twice daily    .  omega-3 acid ethyl esters (LOVAZA) 1 G capsule Take 1 capsule (1 g total) by mouth daily. 90 capsule 3  . pantoprazole (PROTONIX) 40 MG tablet Take 40 mg by mouth 2 (two) times daily.    . Pitavastatin Calcium (LIVALO) 2 MG TABS Take 1 tablet (2 mg total) by mouth daily. 90 tablet 3  . timolol (TIMOPTIC) 0.5 % ophthalmic solution Place 1 drop into both eyes 2 (two) times daily.      No current facility-administered medications for this visit.    History   Social History  . Marital Status: Widowed    Spouse Name: N/A    Number of Children: 4  . Years of Education: N/A   Occupational History  . Not on file.   Social History Main Topics  . Smoking status: Former Smoker    Quit date: 05/07/1984  . Smokeless tobacco: Never Used  . Alcohol Use: No  . Drug Use: No  . Sexual Activity: Not Currently   Other Topics Concern  . Not on file   Social History Narrative    Family History  Problem Relation Age of Onset  . Breast cancer Sister     cervical cancer  . Colon cancer Brother     throat cancer  . Throat cancer Father   . Cancer Mother   . CAD Son   . Pulmonary embolism Daughter   . Hypertension Daughter     ROS General: Negative; No fevers, chills, or night sweats; positive for fatigue  HEENT: Negative; No changes in vision or hearing, sinus congestion, difficulty swallowing Pulmonary: Negative; No cough, wheezing, shortness of breath, hemoptysis Cardiovascular: See HPI:  Positive for claudication GI: Positive for GERD; No nausea, vomiting, diarrhea, or abdominal pain GU: Negative; No dysuria, hematuria, or difficulty voiding Musculoskeletal: Negative; no myalgias, joint pain, or weakness Hematologic: Negative; no easy bruising, bleeding Endocrine: Negative; no heat/cold intolerance; no diabetes, Neuro: Negative; no changes in balance, headaches Skin: Negative; No rashes or skin lesions Psychiatric: Negative; No behavioral problems, depression Sleep: Positive for  daytime sleepiness, hypersomnolence, no bruxism, restless legs, hypnogognic hallucinations. Other comprehensive 14 point system review is negative   Physical Exam BP 180/70 mmHg  Pulse 59  Ht 5\' 2"  (1.575 m)  Wt 127 lb 1.6 oz (57.652 kg)  BMI 23.24 kg/m2  Repeat blood pressure by me 150/68 supine, 140/64 standing. General: Alert, oriented, no distress.  Skin: normal turgor, no rashes, warm and dry HEENT: Normocephalic, atraumatic. Pupils equal round and reactive to light; sclera anicteric; extraocular muscles intact, No lid lag; Nose without nasal septal hypertrophy; Mouth/Parynx benign; Mallinpatti scale 2 Neck: No JVD, no carotid bruits; normal carotid upstroke Lungs: clear to ausculatation and percussion bilaterally; no wheezing or rales, normal inspiratory and expiratory effort Chest wall: without tenderness to palpitation Heart: PMI not displaced, RRR, s1 s2 normal, 2/6 systolic murmur left sternal border, No diastolic murmur, no rubs, gallops, thrills, or heaves Abdomen: soft, nontender; no hepatosplenomehaly, BS+; abdominal aorta nontender and not dilated by palpation. Back: no CVA tenderness Pulses: 2+ upper extremity.  1+ distally  Musculoskeletal: full range of motion, normal strength, no joint deformities Extremities: Pulses 2+, no clubbing cyanosis or edema, Homan's sign negative  Neurologic: grossly nonfocal; Cranial nerves grossly wnl Psychologic: Normal mood and affect  ECG (independently read by me): sinus bradycardia at 59 bpm with first-degree AV block.  LVH with repolarization changes.  ECG (independently read by me) normal sinus rhythm at 66 beats per minute.  First degree AV block with a PR interval at 236 ms.  June 2015 ECG (independently read by me): Sinus bradycardia 59 beats per minute.  First degree AV block with PR interval 240 ms.  Nonspecific ST-T changes.  LABS:  BMET    Component Value Date/Time   NA 140 09/25/2014 1655   K 4.9 09/25/2014 1655    CL 102 09/25/2014 1655   CO2 26 09/25/2014 1655   GLUCOSE 80 09/25/2014 1655   BUN 17 09/25/2014 1655   CREATININE 0.95 09/25/2014 1655   CREATININE 0.88 08/27/2014 0352   CALCIUM 10.4 09/25/2014 1655   GFRNONAA 58* 08/27/2014 0352   GFRAA 67* 08/27/2014 0352     Hepatic Function Panel     Component Value Date/Time   PROT 7.5 08/23/2014 1315   ALBUMIN 4.0 08/23/2014 1315   AST 28 08/23/2014 1315   ALT 14 08/23/2014 1315   ALKPHOS 45 08/23/2014 1315   BILITOT 0.9 08/23/2014 1315     CBC    Component Value Date/Time   WBC 13.3* 08/23/2014 1315   RBC 4.65 08/23/2014 1315   HGB 14.4 08/23/2014 1315   HCT 43.0 08/23/2014 1315   PLT 172 08/23/2014 1315   MCV 92.5 08/23/2014 1315   MCH 31.0 08/23/2014 1315   MCHC 33.5 08/23/2014 1315   RDW 12.9 08/23/2014 1315   LYMPHSABS 1.4 08/23/2014 1315   MONOABS 0.8 08/23/2014 1315   EOSABS 0.1 08/23/2014 1315   BASOSABS 0.0 08/23/2014 1315     BNP    Component Value Date/Time   PROBNP 93.7 10/18/2011 2002    Lipid Panel     Component Value Date/Time   CHOL 215* 08/24/2014 0120     RADIOLOGY: Mm Digital Screening Bilateral  04/25/2014   CLINICAL DATA:  Screening.  EXAM: DIGITAL SCREENING BILATERAL MAMMOGRAM WITH CAD  COMPARISON:  02/17/2012, 09/22/2010.  ACR Breast Density Category b: There are scattered areas of fibroglandular density.  FINDINGS: In the right breast, skin thickening warrants further evaluation with physical examination and possibly ultrasound. In the left breast, no findings suspicious for malignancy. Images were processed with CAD.  IMPRESSION: Further evaluation is suggested for possible skin thickening in the right breast.  RECOMMENDATION: Physical examination and possibly ultrasound of the right breast. (Code:FI-R-37M)  The patient will be contacted regarding the findings, and  additional imaging will be scheduled.  BI-RADS CATEGORY  0: Incomplete. Need additional imaging evaluation and/or prior mammograms  for comparison.   Electronically Signed   By: Luberta Robertson M.D.   On: 04/25/2014 07:41   US Breast Ltd Uni Right Inc Axilla  05/08/2014   CLINICAL DATA:  Screening callback for right breast skin thickening asymmetrically in the inferior breast. The patient denies a history of congestive heart failure. She had right shoulder surgery approximately 1 year ago but denies right arm asymmetric edema.  EXAM: ULTRASOUND OF THE right BREAST  COMPARISON:  Prior mammograms  FINDINGS: On physical exam, I palpate no abnormality in the left breast 6 o'clock/ inferior breast. There is no skin thickening, induration, or erythema.  Ultrasound is performed, showing minimal skin thickening in the right inferior breast measuring 3 mm as compared to the right breast 12 o'clock location measuring 2 mm in maximal thickness. No underlying intramammary abnormality is identified.  IMPRESSION: Minimal asymmetric prominence of the right inferior breast skin without underlying intramammary abnormality. There is no evidence for active infection or other dermatologic abnormality. This is of unclear clinical significance and clinical followup is recommended as part of the patient's routine healthcare maintenance.  RECOMMENDATION: Screening mammogram in one year.(Code:SM-B-01Y)  I have discussed the findings and recommendations with the patient. Results were also provided in writing at the conclusion of the visit. If applicable, a reminder letter will be sent to the patient regarding the next appointment.  BI-RADS CATEGORY  1: Negative.   Electronically Signed   By: Conchita Paris M.D.   On: 05/08/2014 15:34      ASSESSMENT AND PLAN: JulieBass is an 80 year old female with a history of hypertension, moderate concentric left ventricular hypertrophy with LVOT gradient at the midcavity level of 10 mm at her previous echo evaluation in 2013 which was not detected on her most recent echo study.  She has severely calcified mitral annular  calcification and mild pulmonary hypertension.  She recently had a syncopal spell.  IShe has been noted to have episodes of nonsustained ventricular tachycardia, which have been controlled with beta blocker therapy.  A recent hospitalization one episode of nonsustained VT was detected.  There were no significant episodes of bradycardia noted.  Since I last saw her, she feels improved.  Her blood pressure continues to be mildly elevated and she portable has borderline orthostasis, but her standing blood pressure is 140/64.  She is bradycardic at 59.  I will try reducing her metoprolol to 75 mg twice a day.  Dr. Oletta Lamas has recently increased her Protonix to 40 mg twice a day.  Claudication is due to her extensive peripheral vascular disease as noted above with single-vessel runoff in both legs.  She is on aspirin and Plavix therapy.  We'll also obtain a urinalysis today per request of Dr. Oletta Lamas.  She does have renal stones documented on her CT scan.  I will see her in 6 months for cardiology reevaluation.   Troy Sine, MD, [facility]  10/08/2014 8:32 PM

## 2014-10-09 LAB — URINALYSIS, ROUTINE W REFLEX MICROSCOPIC
Bilirubin Urine: NEGATIVE
Glucose, UA: NEGATIVE mg/dL
Hgb urine dipstick: NEGATIVE
Ketones, ur: NEGATIVE mg/dL
Leukocytes, UA: NEGATIVE
Nitrite: NEGATIVE
Protein, ur: NEGATIVE mg/dL
Specific Gravity, Urine: 1.019 (ref 1.005–1.030)
Urobilinogen, UA: 1 mg/dL (ref 0.0–1.0)
pH: 6 (ref 5.0–8.0)

## 2014-10-22 ENCOUNTER — Encounter: Payer: Self-pay | Admitting: Cardiovascular Disease

## 2014-10-22 ENCOUNTER — Telehealth: Payer: Self-pay | Admitting: Cardiovascular Disease

## 2014-10-22 NOTE — Telephone Encounter (Signed)
Received records from [facility]) for appointment on 10/28/14 with Dr Claiborne Billings.  Records given to [facility] (medical records) for Dr Ermalinda Memos schedule on 10/28/14.  lp

## 2014-10-24 ENCOUNTER — Encounter (HOSPITAL_COMMUNITY): Payer: Self-pay | Admitting: Internal Medicine

## 2014-10-25 ENCOUNTER — Ambulatory Visit (INDEPENDENT_AMBULATORY_CARE_PROVIDER_SITE_OTHER): Payer: Medicare Other | Admitting: *Deleted

## 2014-10-25 DIAGNOSIS — R55 Syncope and collapse: Secondary | ICD-10-CM

## 2014-10-28 ENCOUNTER — Ambulatory Visit: Payer: Medicare Other | Admitting: Cardiovascular Disease

## 2014-10-29 ENCOUNTER — Telehealth: Payer: Self-pay | Admitting: Family Medicine

## 2014-10-29 NOTE — Telephone Encounter (Signed)
Pt's daughter was called and reminded that pt hasn't been seen in a while. Pt's daughter stated she would call back after the first of the year and set something up.

## 2014-10-30 NOTE — Progress Notes (Signed)
Loop recorder 

## 2014-11-20 LAB — MDC_IDC_ENUM_SESS_TYPE_REMOTE

## 2014-11-25 ENCOUNTER — Encounter: Payer: Self-pay | Admitting: Cardiovascular Disease

## 2014-11-25 ENCOUNTER — Ambulatory Visit (INDEPENDENT_AMBULATORY_CARE_PROVIDER_SITE_OTHER): Payer: Self-pay | Admitting: *Deleted

## 2014-11-25 DIAGNOSIS — R55 Syncope and collapse: Secondary | ICD-10-CM

## 2014-11-29 NOTE — Progress Notes (Signed)
Loop recorder 

## 2014-12-02 ENCOUNTER — Other Ambulatory Visit: Payer: Medicare Other

## 2014-12-24 LAB — MDC_IDC_ENUM_SESS_TYPE_REMOTE

## 2014-12-25 ENCOUNTER — Ambulatory Visit (INDEPENDENT_AMBULATORY_CARE_PROVIDER_SITE_OTHER): Payer: Medicare Other | Admitting: *Deleted

## 2014-12-25 DIAGNOSIS — R55 Syncope and collapse: Secondary | ICD-10-CM | POA: Diagnosis not present

## 2014-12-26 ENCOUNTER — Encounter: Payer: Self-pay | Admitting: Cardiology

## 2014-12-26 LAB — MDC_IDC_ENUM_SESS_TYPE_REMOTE

## 2014-12-26 NOTE — Progress Notes (Signed)
Loop recorder 

## 2015-01-01 DIAGNOSIS — H4011X3 Primary open-angle glaucoma, severe stage: Secondary | ICD-10-CM | POA: Diagnosis not present

## 2015-01-01 DIAGNOSIS — Z961 Presence of intraocular lens: Secondary | ICD-10-CM | POA: Diagnosis not present

## 2015-01-06 ENCOUNTER — Other Ambulatory Visit: Payer: Self-pay | Admitting: *Deleted

## 2015-01-06 ENCOUNTER — Encounter: Payer: Self-pay | Admitting: Cardiovascular Disease

## 2015-01-06 MED ORDER — METOPROLOL TARTRATE 50 MG PO TABS: 75.0000 mg | ORAL_TABLET | Freq: Two times a day (BID) | ORAL | Status: DC

## 2015-01-16 ENCOUNTER — Other Ambulatory Visit: Payer: Self-pay | Admitting: *Deleted

## 2015-01-16 DIAGNOSIS — F0390 Unspecified dementia without behavioral disturbance: Secondary | ICD-10-CM

## 2015-01-16 DIAGNOSIS — R29898 Other symptoms and signs involving the musculoskeletal system: Secondary | ICD-10-CM

## 2015-01-24 ENCOUNTER — Other Ambulatory Visit: Payer: Self-pay | Admitting: *Deleted

## 2015-01-24 ENCOUNTER — Ambulatory Visit (INDEPENDENT_AMBULATORY_CARE_PROVIDER_SITE_OTHER): Payer: Medicare Other | Admitting: *Deleted

## 2015-01-24 ENCOUNTER — Encounter: Payer: Self-pay | Admitting: Cardiovascular Disease

## 2015-01-24 DIAGNOSIS — R55 Syncope and collapse: Secondary | ICD-10-CM | POA: Diagnosis not present

## 2015-01-24 MED ORDER — AMLODIPINE BESYLATE 2.5 MG PO TABS: 2.5000 mg | ORAL_TABLET | Freq: Every day | ORAL | Status: DC

## 2015-01-28 ENCOUNTER — Ambulatory Visit (INDEPENDENT_AMBULATORY_CARE_PROVIDER_SITE_OTHER): Payer: Medicare Other | Admitting: Diagnostic Neuroimaging

## 2015-01-28 ENCOUNTER — Encounter: Payer: Self-pay | Admitting: Diagnostic Neuroimaging

## 2015-01-28 VITALS — BP 160/80 | HR 52 | Ht 62.0 in | Wt 121.0 lb

## 2015-01-28 DIAGNOSIS — R5381 Other malaise: Secondary | ICD-10-CM | POA: Diagnosis not present

## 2015-01-28 DIAGNOSIS — F0390 Unspecified dementia without behavioral disturbance: Secondary | ICD-10-CM

## 2015-01-28 DIAGNOSIS — I739 Peripheral vascular disease, unspecified: Secondary | ICD-10-CM | POA: Diagnosis not present

## 2015-01-28 DIAGNOSIS — R269 Unspecified abnormalities of gait and mobility: Secondary | ICD-10-CM | POA: Diagnosis not present

## 2015-01-28 DIAGNOSIS — F03B0 Unspecified dementia, moderate, without behavioral disturbance, psychotic disturbance, mood disturbance, and anxiety: Secondary | ICD-10-CM

## 2015-01-28 NOTE — Progress Notes (Signed)
[facility]  PATIENT: Julie Bass DOB: 1926/05/16  REFERRING CLINICIAN: Corky Downs, MD HISTORY FROM: patient and son in law; spoke with daughter via phone; chart review REASON FOR VISIT: new consult    HISTORICAL  CHIEF COMPLAINT:  Chief Complaint  Patient presents with  . New Evaluation    memory issues     HISTORY OF PRESENT ILLNESS:   81 year old female with hypertension, hyperlipidemia, peripheral vascular disease, intermittent nonsustained ventricular tachycardia, episode of syncope in October 2015, here for evaluation of gait difficulty, falling down, memory loss.  Patient denies any memory problems. She states that she falls down after walking, and her legs feel like they "give out". She does not have significant pain in her legs. She does not pass out with these events. This is been going on since October 2015.  I spoke with patient's son-in-law who brought the patient to this visit, and he stated that short-term memory loss problems have been developing since 2013. I called the patient's daughter who was at home sick, and spoke with her by phone, and she stated that patient has been diagnosed with mild dementia, tried on Aricept, switch to Namenda, but had to stop both medications due to severe diarrhea. Patient lives at home with their son, the patient's grandson, but she is left alone at home for most of the daytime when everyone else's at work. She no longer can drive, take care of her bills, housework. She still cooks, bathes herself, dresses herself. She uses a single-point cane.   REVIEW OF SYSTEMS: Full 14 system review of systems performed and notable only for joint pain aching pain muscle pain muscle memory loss.  ALLERGIES: Allergies  Allergen Reactions  . Crestor [Rosuvastatin Calcium]     Muscle pain  . Lipitor [Atorvastatin Calcium]     Muscle pain    HOME MEDICATIONS: Outpatient Prescriptions Prior to Visit  Medication Sig  Dispense Refill  . amLODipine (NORVASC) 2.5 MG tablet Take 1 tablet (2.5 mg total) by mouth daily. 90 tablet 3  . aspirin EC 81 MG tablet Take 81 mg by mouth daily.    . citalopram (CELEXA) 10 MG tablet Take 1 tablet (10 mg total) by mouth daily. 90 tablet 3  . clopidogrel (PLAVIX) 75 MG tablet Take 1 tablet (75 mg total) by mouth daily. 90 tablet 3  . ezetimibe (ZETIA) 10 MG tablet Take 1 tablet (10 mg total) by mouth daily. 90 tablet 3  . feeding supplement, ENSURE COMPLETE, (ENSURE COMPLETE) LIQD Take 237 mLs by mouth 3 (three) times daily between meals.    Marland Kitchen losartan (COZAAR) 100 MG tablet Take 1 tablet (100 mg total) by mouth daily. 90 tablet 3  . metoprolol (LOPRESSOR) 50 MG tablet Take 1.5 tablets (75 mg total) by mouth 2 (two) times daily. Take 75 mg twice daily 270 tablet 3  . omega-3 acid ethyl esters (LOVAZA) 1 G capsule Take 1 capsule (1 g total) by mouth daily. 90 capsule 3  . Pitavastatin Calcium (LIVALO) 2 MG TABS Take 1 tablet (2 mg total) by mouth daily. 90 tablet 3  . timolol (TIMOPTIC) 0.5 % ophthalmic solution Place 1 drop into both eyes 2 (two) times daily.     . pantoprazole (PROTONIX) 40 MG tablet Take 40 mg by mouth 2 (two) times daily.     No facility-administered medications prior to visit.    PAST MEDICAL HISTORY: Past Medical History  Diagnosis Date  . Glaucoma   . Osteoporosis   .  Hyperlipemia 10/18/2011  . PAD (peripheral artery disease) 10/18/2011    h/o left SFA stent  . HTN (hypertension) 10/18/2011  . NSVT (nonsustained ventricular tachycardia) 10/18/2011  . Depression   . Chronic kidney disease     KIDNEY STONES  . Hiatal hernia   . Arthritis   . Dementia     BEGINNING STAGES   . GERD (gastroesophageal reflux disease)     PAST SURGICAL HISTORY: Past Surgical History  Procedure Laterality Date  . Cholecystectomy    . Abdominal hysterectomy  1973  . Cardiac catheterization      2012  . Reverse shoulder arthroplasty Right 01/26/2013     Procedure: RIGHT REVERSE TOTAL SHOULDER ARTHROPLASTY;  Surgeon: Augustin Schooling, MD;  Location: [facility];  Service: Orthopedics;  Laterality: Right;  . Transthoracic echocardiogram  10/2012    EF 75-70%, mod conc hypertrophy, severely calcified MV annulus, LA mildly dailted, PA peak pressure 38mmHg  . Nm myocar perf wall motion  10/2011    non-gated - normal stuy, EF 82%, normal LV wall motion  . Lower extremity arterial doppler  2013    right SFA with 50-69% diameter reduction, right PTA/peroneal occluded, L SFA prox to stent has narrowing with increased velocities >60% diameter reduction, L SFA stent patent, L ATA w/occlusive disease, bilat ABIs show mild arterial insuffiency at rest  . Lower extremity angiogram  11/02/2007    stent of mid left SFA with 6x153mm EV3 self-expanding stent and 6x3 in prox region (Dr. Adora Fridge)  . Lower extremity angiogram    . Loop recorder implant N/A 08/27/2014    Procedure: LOOP RECORDER IMPLANT;  Surgeon: Coralyn Mark, MD;  Location: [facility];  Service: Cardiovascular;  Laterality: N/A;    FAMILY HISTORY: Family History  Problem Relation Age of Onset  . Breast cancer Sister     cervical cancer  . Colon cancer Brother     throat cancer  . Throat cancer Father   . Cancer Mother   . CAD Son   . Pulmonary embolism Daughter   . Hypertension Daughter     SOCIAL HISTORY:  History   Social History  . Marital Status: Widowed    Spouse Name: N/A  . Number of Children: 4  . Years of Education: N/A   Occupational History  . Not on file.   Social History Main Topics  . Smoking status: Former Smoker    Quit date: 05/07/1984  . Smokeless tobacco: Never Used  . Alcohol Use: No  . Drug Use: No  . Sexual Activity: Not Currently   Other Topics Concern  . Not on file   Social History Narrative     PHYSICAL EXAM  Filed Vitals:   01/28/15 1126  BP: 160/80  Pulse: 52  Height: 5\' 2"  (1.575 m)  Weight: 121 lb (54.885 kg)    Body mass  index is 22.13 kg/(m^2).  No exam data present  MMSE - Mini Mental State Exam 01/28/2015  Orientation to time 4  Orientation to Place 3  Registration 3  Attention/ Calculation 1  Recall 0  Language- name 2 objects 2  Language- repeat 0  Language- follow 3 step command 1  Language- read & follow direction 1  Write a sentence 1  Copy design 0  Total score 16    GENERAL EXAM: Patient is in no distress; well developed, nourished and groomed; neck is supple  CARDIOVASCULAR: Regular rate and rhythm, no murmurs, no carotid bruits  NEUROLOGIC:  MENTAL STATUS: awake, alert, DEFICITS IN ORIENTATION (MISSED DATE, BUILDING AND FLOOR); oriented to person and time, REGISTERS 3/3; RECALLS 0/3. DECR MEMORY (SHORT TERM). POOR INSIGHT. Remote memory intact, DECR ATTENTION AND CONCENTRATION; DECR FLUENCY; FOLLOWS SIMPLE COMMANDS, BUT CANNOT FOLLOW 3 STEP OR COMPLEX COMMANDS. naming intact, fund of knowledge appropriate; POSITIVE SNOUT AND MYERSON'S REFLEXES CRANIAL NERVE: no papilledema on fundoscopic exam, pupils POST - SURGICAL AND MIN RXN; visual fields full to confrontation, extraocular muscles intact, no nystagmus, facial sensation and strength symmetric, hearing intact, palate elevates symmetrically, uvula midline, shoulder shrug symmetric, tongue midline. MOTOR: normal bulk and tone; BILATERAL UPPER EXT 4; BILATERAL LOWER EXT 4; MILD APRAXIA SENSORY: DECR PP AND VIB IN FEET (LEFT WOSE THAN RIGHT) COORDINATION: finger-nose-finger, fine finger movements SLOW REFLEXES: deep tendon reflexes present and symmetric; TRACE AT ANKLES GAIT/STATION: SLOW TO RISE; UNSTEADY. SHORT SMALL STEPS. USES SINGLE POINT CANE. SIGNIFICANT GAIT APRAXIA; SLOW EN BLOC TURNING; STOOPED POSTURE. CANNOT TANDEM.      DIAGNOSTIC DATA (LABS, IMAGING, TESTING) - I reviewed patient records, labs, notes, testing and imaging myself where available.  Lab Results  Component Value Date   WBC 13.3* 08/23/2014   HGB 14.4  08/23/2014   HCT 43.0 08/23/2014   MCV 92.5 08/23/2014   PLT 172 08/23/2014      Component Value Date/Time   NA 140 09/25/2014 1655   K 4.9 09/25/2014 1655   CL 102 09/25/2014 1655   CO2 26 09/25/2014 1655   GLUCOSE 80 09/25/2014 1655   BUN 17 09/25/2014 1655   CREATININE 0.95 09/25/2014 1655   CREATININE 0.88 08/27/2014 0352   CALCIUM 10.4 09/25/2014 1655   PROT 7.5 08/23/2014 1315   ALBUMIN 4.0 08/23/2014 1315   AST 28 08/23/2014 1315   ALT 14 08/23/2014 1315   ALKPHOS 45 08/23/2014 1315   BILITOT 0.9 08/23/2014 1315   GFRNONAA 58* 08/27/2014 0352   GFRAA 67* 08/27/2014 0352   Lab Results  Component Value Date   CHOL 215* 08/24/2014   HDL 82 08/24/2014   LDLCALC 113* 08/24/2014   TRIG 99 08/24/2014   CHOLHDL 2.6 08/24/2014   Lab Results  Component Value Date   HGBA1C 5.6 05/15/2014   No results found for: VITAMINB12 Lab Results  Component Value Date   TSH 2.203 05/15/2014   I reviewed CXR images myself and agree with interpretation. -VRP  08/23/14 CXR - Emphysema without acute cardiopulmonary process.  08/26/14 CAROTID U/S - Findings suggest 1-39% internal carotid artery stenosis bilaterally. The left vertebral artery is patent with antegrade flow. Unable to visualize the right vertebral artery.  05/15/14 TTE - Left ventricle: The cavity size was normal. Systolic function was vigorous. The estimated ejection fraction was in the range of 65% to 70%. Wall motion was normal; there were no regional wall motion abnormalities. Doppler parameters are consistent with abnormal left ventricular relaxation (grade 1 diastolic dysfunction). - Mitral valve: Moderately calcified annulus.  05/15/14 BLE arterial u/s  - right SFA: 50-69% stenosis - right PTA and peroneal: occluded - left SFA proximal to stent: > 60% stenosis - left PTA: occluded - left ATA: occlusive disease with reconstitution at the dorsalis pedis - bilateral ABIs: mild arterial insufficiency to  lower extremities at rest     ASSESSMENT AND PLAN  81 y.o. year old female here with progressive memory loss since 2013, and now gait decline since 2015. She has significant peripheral vascular disease, new syncope events, more falls, and more leg weakness recently. Findings are likely due  to several factors:  Diagnoses: moderate dementia (likely alzheimer's dz vs vascular dementia) + deconditioning + peripheral vascular disease + syncope  PLAN: - I spent 60 minutes of face to face time with patient. I reviewed chart, discussed with patient and son in law in room, and daughter via phone, and summaried care plan. Greater than 50% of time was spent in counseling and coordination of care with patient. In summary we discussed dementia diagnosis, prognosis, safety considerations, treatment plan, including home health safety and CT head. Medications were discussed but she is not tolerant of donepezil or namenda. Would recommend social work and palliative care consult to help patient and family establish longer term goals of care.   Orders Placed This Encounter  Procedures  . CT Head Wo Contrast  . Ambulatory referral to [facility]   Return in about 3 months (around 04/30/2015) for with daughter at follow up visit.    Penni Bombard, MD 4/59/9774, 14:23 PM Certified in Neurology, Neurophysiology and Neuroimaging  [facility] [address] [place], [place] [address] [phone]

## 2015-01-30 NOTE — Progress Notes (Signed)
Loop recorder 

## 2015-02-03 ENCOUNTER — Encounter: Payer: Self-pay | Admitting: Internal Medicine

## 2015-02-04 ENCOUNTER — Telehealth: Payer: Self-pay | Admitting: Diagnostic Neuroimaging

## 2015-02-04 DIAGNOSIS — I739 Peripheral vascular disease, unspecified: Secondary | ICD-10-CM | POA: Diagnosis not present

## 2015-02-04 DIAGNOSIS — R296 Repeated falls: Secondary | ICD-10-CM | POA: Diagnosis not present

## 2015-02-04 DIAGNOSIS — I10 Essential (primary) hypertension: Secondary | ICD-10-CM | POA: Diagnosis not present

## 2015-02-04 DIAGNOSIS — F17211 Nicotine dependence, cigarettes, in remission: Secondary | ICD-10-CM | POA: Diagnosis not present

## 2015-02-04 DIAGNOSIS — F0390 Unspecified dementia without behavioral disturbance: Secondary | ICD-10-CM | POA: Diagnosis not present

## 2015-02-04 DIAGNOSIS — E785 Hyperlipidemia, unspecified: Secondary | ICD-10-CM | POA: Diagnosis not present

## 2015-02-04 NOTE — Telephone Encounter (Signed)
Olean Ree, Physical Therapist with [facility] @ [phone], requesting an order for Occupational Therapy.  Please call and advise.

## 2015-02-06 DIAGNOSIS — F17211 Nicotine dependence, cigarettes, in remission: Secondary | ICD-10-CM | POA: Diagnosis not present

## 2015-02-06 DIAGNOSIS — R296 Repeated falls: Secondary | ICD-10-CM | POA: Diagnosis not present

## 2015-02-06 DIAGNOSIS — I739 Peripheral vascular disease, unspecified: Secondary | ICD-10-CM | POA: Diagnosis not present

## 2015-02-06 DIAGNOSIS — I10 Essential (primary) hypertension: Secondary | ICD-10-CM | POA: Diagnosis not present

## 2015-02-06 DIAGNOSIS — E785 Hyperlipidemia, unspecified: Secondary | ICD-10-CM | POA: Diagnosis not present

## 2015-02-06 DIAGNOSIS — F0390 Unspecified dementia without behavioral disturbance: Secondary | ICD-10-CM | POA: Diagnosis not present

## 2015-02-07 ENCOUNTER — Other Ambulatory Visit: Payer: Self-pay | Admitting: *Deleted

## 2015-02-07 DIAGNOSIS — F03B0 Unspecified dementia, moderate, without behavioral disturbance, psychotic disturbance, mood disturbance, and anxiety: Secondary | ICD-10-CM

## 2015-02-07 DIAGNOSIS — F0390 Unspecified dementia without behavioral disturbance: Secondary | ICD-10-CM

## 2015-02-10 ENCOUNTER — Ambulatory Visit
Admission: RE | Admit: 2015-02-10 | Discharge: 2015-02-10 | Disposition: A | Discharge status: Home / Self Care | Payer: Medicare Other | Source: Ambulatory Visit | Attending: Diagnostic Neuroimaging | Admitting: Diagnostic Neuroimaging

## 2015-02-10 DIAGNOSIS — R5381 Other malaise: Secondary | ICD-10-CM | POA: Diagnosis not present

## 2015-02-10 DIAGNOSIS — I739 Peripheral vascular disease, unspecified: Secondary | ICD-10-CM

## 2015-02-10 DIAGNOSIS — R269 Unspecified abnormalities of gait and mobility: Secondary | ICD-10-CM | POA: Diagnosis not present

## 2015-02-10 DIAGNOSIS — F0390 Unspecified dementia without behavioral disturbance: Secondary | ICD-10-CM

## 2015-02-10 DIAGNOSIS — F03B0 Unspecified dementia, moderate, without behavioral disturbance, psychotic disturbance, mood disturbance, and anxiety: Secondary | ICD-10-CM

## 2015-02-11 DIAGNOSIS — R296 Repeated falls: Secondary | ICD-10-CM | POA: Diagnosis not present

## 2015-02-11 DIAGNOSIS — I10 Essential (primary) hypertension: Secondary | ICD-10-CM | POA: Diagnosis not present

## 2015-02-11 DIAGNOSIS — F0390 Unspecified dementia without behavioral disturbance: Secondary | ICD-10-CM | POA: Diagnosis not present

## 2015-02-11 DIAGNOSIS — E785 Hyperlipidemia, unspecified: Secondary | ICD-10-CM | POA: Diagnosis not present

## 2015-02-11 DIAGNOSIS — F17211 Nicotine dependence, cigarettes, in remission: Secondary | ICD-10-CM | POA: Diagnosis not present

## 2015-02-11 DIAGNOSIS — I739 Peripheral vascular disease, unspecified: Secondary | ICD-10-CM | POA: Diagnosis not present

## 2015-02-11 LAB — MDC_IDC_ENUM_SESS_TYPE_REMOTE: Date Time Interrogation Session: 20160309050500

## 2015-02-13 DIAGNOSIS — I739 Peripheral vascular disease, unspecified: Secondary | ICD-10-CM | POA: Diagnosis not present

## 2015-02-13 DIAGNOSIS — R296 Repeated falls: Secondary | ICD-10-CM | POA: Diagnosis not present

## 2015-02-13 DIAGNOSIS — F17211 Nicotine dependence, cigarettes, in remission: Secondary | ICD-10-CM | POA: Diagnosis not present

## 2015-02-13 DIAGNOSIS — I10 Essential (primary) hypertension: Secondary | ICD-10-CM | POA: Diagnosis not present

## 2015-02-13 DIAGNOSIS — E785 Hyperlipidemia, unspecified: Secondary | ICD-10-CM | POA: Diagnosis not present

## 2015-02-13 DIAGNOSIS — F0390 Unspecified dementia without behavioral disturbance: Secondary | ICD-10-CM | POA: Diagnosis not present

## 2015-02-18 ENCOUNTER — Telehealth: Payer: Self-pay | Admitting: Diagnostic Neuroimaging

## 2015-02-18 ENCOUNTER — Encounter: Payer: Self-pay | Admitting: Cardiovascular Disease

## 2015-02-18 DIAGNOSIS — F0390 Unspecified dementia without behavioral disturbance: Secondary | ICD-10-CM | POA: Diagnosis not present

## 2015-02-18 DIAGNOSIS — F17211 Nicotine dependence, cigarettes, in remission: Secondary | ICD-10-CM | POA: Diagnosis not present

## 2015-02-18 DIAGNOSIS — R296 Repeated falls: Secondary | ICD-10-CM | POA: Diagnosis not present

## 2015-02-18 DIAGNOSIS — E785 Hyperlipidemia, unspecified: Secondary | ICD-10-CM | POA: Diagnosis not present

## 2015-02-18 DIAGNOSIS — I10 Essential (primary) hypertension: Secondary | ICD-10-CM | POA: Diagnosis not present

## 2015-02-18 DIAGNOSIS — I739 Peripheral vascular disease, unspecified: Secondary | ICD-10-CM | POA: Diagnosis not present

## 2015-02-18 NOTE — Telephone Encounter (Signed)
Patient daughter is calling as she would like someone to call her about the CT scan of her mother's head.  Please call her with results. Thanks!

## 2015-02-20 DIAGNOSIS — F17211 Nicotine dependence, cigarettes, in remission: Secondary | ICD-10-CM | POA: Diagnosis not present

## 2015-02-20 DIAGNOSIS — I739 Peripheral vascular disease, unspecified: Secondary | ICD-10-CM | POA: Diagnosis not present

## 2015-02-20 DIAGNOSIS — E785 Hyperlipidemia, unspecified: Secondary | ICD-10-CM | POA: Diagnosis not present

## 2015-02-20 DIAGNOSIS — F0390 Unspecified dementia without behavioral disturbance: Secondary | ICD-10-CM | POA: Diagnosis not present

## 2015-02-20 DIAGNOSIS — R296 Repeated falls: Secondary | ICD-10-CM | POA: Diagnosis not present

## 2015-02-20 DIAGNOSIS — I10 Essential (primary) hypertension: Secondary | ICD-10-CM | POA: Diagnosis not present

## 2015-02-20 NOTE — Telephone Encounter (Signed)
Spoke with Dr. Leta Baptist about the daughter wanting to know what the MRI showed.  Krystal Eaton, the daughter, back and informed her of what Dr. Mamie Nick said. He told me to inform her that her CT showed atrophy changes which were indicative of dementia and nothing else that he was concerned with and to continue on with the current treatment plan. [name] stated an understanding and thanked me for calling and informing me about the CT. I told her to call back with any further questions.

## 2015-02-20 NOTE — Telephone Encounter (Signed)
Patient's daughter is calling again to get CT results for patient. Please call.

## 2015-02-22 ENCOUNTER — Other Ambulatory Visit: Payer: Self-pay | Admitting: *Deleted

## 2015-02-22 MED ORDER — LOSARTAN POTASSIUM 100 MG PO TABS: 100.0000 mg | ORAL_TABLET | Freq: Every day | ORAL | Status: DC

## 2015-02-22 MED ORDER — CLOPIDOGREL BISULFATE 75 MG PO TABS: 75.0000 mg | ORAL_TABLET | Freq: Every day | ORAL | Status: DC

## 2015-02-22 NOTE — Telephone Encounter (Signed)
ERx sent

## 2015-02-24 ENCOUNTER — Ambulatory Visit (INDEPENDENT_AMBULATORY_CARE_PROVIDER_SITE_OTHER): Payer: Medicare Other | Admitting: *Deleted

## 2015-02-24 DIAGNOSIS — R55 Syncope and collapse: Secondary | ICD-10-CM

## 2015-02-25 NOTE — Progress Notes (Signed)
Loop recorder 

## 2015-02-28 ENCOUNTER — Encounter: Payer: Self-pay | Admitting: Cardiovascular Disease

## 2015-03-03 DIAGNOSIS — M1711 Unilateral primary osteoarthritis, right knee: Secondary | ICD-10-CM | POA: Diagnosis not present

## 2015-03-07 ENCOUNTER — Ambulatory Visit: Payer: Medicare Other | Admitting: Cardiovascular Disease

## 2015-03-10 ENCOUNTER — Other Ambulatory Visit: Payer: Self-pay

## 2015-03-10 DIAGNOSIS — E785 Hyperlipidemia, unspecified: Secondary | ICD-10-CM

## 2015-03-10 DIAGNOSIS — Z79899 Other long term (current) drug therapy: Secondary | ICD-10-CM

## 2015-03-10 DIAGNOSIS — I10 Essential (primary) hypertension: Secondary | ICD-10-CM

## 2015-03-10 MED ORDER — EZETIMIBE 10 MG PO TABS: 10.0000 mg | ORAL_TABLET | Freq: Every day | ORAL | Status: DC

## 2015-03-10 MED ORDER — OMEGA-3-ACID ETHYL ESTERS 1 G PO CAPS: 1.0000 g | ORAL_CAPSULE | Freq: Every day | ORAL | Status: DC

## 2015-03-10 NOTE — Telephone Encounter (Signed)
Rx(s) sent to pharmacy electronically.   Blood work ordered for 6 month office visit

## 2015-03-14 ENCOUNTER — Ambulatory Visit (INDEPENDENT_AMBULATORY_CARE_PROVIDER_SITE_OTHER): Payer: Medicare Other | Admitting: Cardiovascular Disease

## 2015-03-14 ENCOUNTER — Encounter: Payer: Self-pay | Admitting: Cardiovascular Disease

## 2015-03-14 VITALS — BP 132/80 | Ht 64.0 in | Wt 127.0 lb

## 2015-03-14 DIAGNOSIS — E785 Hyperlipidemia, unspecified: Secondary | ICD-10-CM | POA: Diagnosis not present

## 2015-03-14 DIAGNOSIS — Z79899 Other long term (current) drug therapy: Secondary | ICD-10-CM | POA: Diagnosis not present

## 2015-03-14 DIAGNOSIS — I158 Other secondary hypertension: Secondary | ICD-10-CM | POA: Diagnosis not present

## 2015-03-14 DIAGNOSIS — I10 Essential (primary) hypertension: Secondary | ICD-10-CM

## 2015-03-14 DIAGNOSIS — I739 Peripheral vascular disease, unspecified: Secondary | ICD-10-CM

## 2015-03-14 DIAGNOSIS — F0390 Unspecified dementia without behavioral disturbance: Secondary | ICD-10-CM | POA: Diagnosis not present

## 2015-03-14 DIAGNOSIS — I472 Ventricular tachycardia: Secondary | ICD-10-CM

## 2015-03-14 DIAGNOSIS — I4729 Other ventricular tachycardia: Secondary | ICD-10-CM

## 2015-03-14 NOTE — Progress Notes (Signed)
Patient ID: Julie Bass, female   DOB: June 15, 1926, 81 y.o.   MRN: [id number]     HPI: Julie Bass is a 81 y.o. female who presents to the office today in followup of her recent blood pressure lability.  Julie Bass has a history of hypertension, hyperlipidemia, peripheral vascular disease, as well as episodes of nonsustained ventricular tachycardia, for, which she's been treated with beta blocker therapy.  An echo Doppler study in December 2013 showed hyperdynamic LV function with an ejection fraction of at least 91%, grade 1 diastolic dysfunction, and dynamic obstruction in the mid cavity of the left ventricle with a gradient of 10 mm.  She had severely calcified mitral annulus and mild left atrial dilatation.  She has documented nonsustained ventricular tachycardia, which was picked up on a remote event monitor, and she has tolerated titration of her beta blocker therapy.  She also has had difficulty with statin intolerance for hyperlipidemia, but has tolerated livalo 2 mg daily.  She also has history of hypertension and has been on losartan 100 mg in addition to her metoprolol.  She has peripheral vascular disease and is status post stenting of her left SFA in the past.  She has one vessel runoff bilaterally and has moderate right SFA disease.  Her last LE Doppler study was in August 2013 , which showed occluded right PTA and peroneal occluded left PTA, left ATA with reconstitution of the dorsalis pedis artery.  ABIs were 0.88 on the right and 0.86 on the left.  She has been on aspirin 81 mg, Plavix 75 mg in addition to Pletal 50 mg.  A 2 year followup echo Doppler study on 05/15/2014 showed an ejection fraction of 65-70% and there was grade 1 diastolic dysfunction.  There was moderate mitral annular calcification.  There was no mention of any dynamic obstruction.  Lower extremity Doppler studies revealed a patent left SFA stent with prior disease noted in several vessels, not significantly  changed from 2013.  She recently was hospitalized on 08/23/2014 after developing a syncopal spell.  She was not significantly orthostatic to her hospitalization.  Upon presentation her blood pressure was 170/58.  Her ECG showed sinus rhythm with mild ST changes laterally, and first degree heart block.  She was noted to have a single 15 beat run of nonsustained VT.  She ultimately underwent implantation of a loop recorder on 08/27/2014.  She underwent carotid duplex imaging and findings suggest 1-39% internal carotid artery stenosis bilaterally. The left vertebral artery is patent with antegrade flow but the the right vertebral artery was not able to be visualized.  After discharge, the loop recorder detected 2 episodes of questionable transient asystole.  Upon review of these tracings with Dr. Sallyanne Kuster it appears that these may very well be artifactual.  One episode was recorded on October 14, and another on October 16.  The episode in October 16 tach, rate of less than over 30 seconds.  The patient was with her daughter, who is my nurse at the time.  She denies any symptoms at this time.  Specifically, she denied any lightheadedness, and one would expect that with a >30 second pause she would have been significantly symptomatic and this would have also been noted by her daughter.  Since I last saw her, Julie Bass has felt improved.  She denies any major episodes of dizziness but if she gets up quickly or lies down fast.  A CT of her abdomen showed atherosclerotic plaque throughout her abdominal  aorta with suggestion of a tiny 0.5 x 0.2 cm atherosclerotic ulcer.  She was also noted to have plaque that was irregular throughout the right superficial femoral artery and also plaque in the popliteal artery, not resulting in hemodynamically significance stenosis.  She had single-vessel runoff to the right lower leg and foot.  The right anterior tibial artery with atretic reconstitution of both posterior tibial and  peroneal vascular distributions.  The right dorsalis pedis artery was patent to the level of the forefoot.  She had stents noted in the distal aspect of her left common femoral artery and single vessel runoff to the left lower leg and foot.  There was occlusion of both the left anterior, posterior tibial arteries.  The peroneal artery was found to reconstitute a dorsalis pedis artery at the level of the hindfoot.  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    Since I last saw her, she underwent neurologic evaluation with Dr. Corwin Levins.  She is felt to have moderate  dementia felt to be Alzheimer's  versus vascular dementia.  A CT scan of her head revealed mild cortical atrophy and moderately advanced small vessel ischemic changes involving the periventricular white matter with remote infarction in the right frontal lobe.  She is unaware of any palpitations.  She has not been demonstrated to have any further abnormal rhythms on loop monitoring.  Recently, her blood pressure had become labile at home and her amlodipine dose was increased from 2.5-5 mg.  She continues to be on metoprolol, tartrate 75 mg twice a day in addition to losartan 100 mg daily for blood pressure control.  She is tolerating Livalo 2 mg for hyperlipidemia with Zetia 10 mg.  She is on aspirin 81 mg and Plavix 75 mg.  She takes Celexa 10 mg for depression.  She also is on Lovaza 1 capsule daily.    Past Medical History  Diagnosis Date  . Glaucoma   . Osteoporosis   . Hyperlipemia 10/18/2011  . PAD (peripheral artery disease) 10/18/2011    h/o left SFA stent  . HTN (hypertension) 10/18/2011  . NSVT (nonsustained ventricular tachycardia) 10/18/2011  . Depression   . Chronic kidney disease     KIDNEY STONES  . Hiatal hernia   . Arthritis   . Dementia     BEGINNING STAGES   . GERD (gastroesophageal reflux disease)     Past Surgical History    Procedure Laterality Date  . Cholecystectomy    . Abdominal hysterectomy  1973  . Cardiac catheterization      2012  . Reverse shoulder arthroplasty Right 01/26/2013    Procedure: RIGHT REVERSE TOTAL SHOULDER ARTHROPLASTY;  Surgeon: Augustin Schooling, MD;  Location: [facility];  Service: Orthopedics;  Laterality: Right;  . Transthoracic echocardiogram  10/2012    EF 75-70%, mod conc hypertrophy, severely calcified MV annulus, LA mildly dailted, PA peak pressure 74mHg  . Nm myocar perf wall motion  10/2011    non-gated - normal stuy, EF 82%, normal LV wall motion  . Lower extremity arterial doppler  2013    right SFA with 50-69% diameter reduction, right PTA/peroneal occluded, L SFA prox to stent has narrowing with increased velocities >60% diameter reduction, L SFA stent patent, L ATA w/occlusive disease, bilat ABIs show mild arterial insuffiency at rest  . Lower extremity angiogram  11/02/2007    stent of mid left SFA with [id number]  self-expanding stent and 6x3 in prox region (Dr. Adora Fridge)  . Lower extremity angiogram    . Loop recorder implant N/A 08/27/2014    Procedure: LOOP RECORDER IMPLANT;  Surgeon: Coralyn Mark, MD;  Location: [facility];  Service: Cardiovascular;  Laterality: N/A;    Allergies  Allergen Reactions  . Crestor [Rosuvastatin Calcium]     Muscle pain  . Lipitor [Atorvastatin Calcium]     Muscle pain    Current Outpatient Prescriptions  Medication Sig Dispense Refill  . amLODipine (NORVASC) 5 MG tablet Take 5 mg by mouth daily.    Marland Kitchen aspirin EC 81 MG tablet Take 81 mg by mouth daily.    . citalopram (CELEXA) 10 MG tablet Take 1 tablet (10 mg total) by mouth daily. 90 tablet 3  . clopidogrel (PLAVIX) 75 MG tablet Take 1 tablet (75 mg total) by mouth daily. 90 tablet 3  . ezetimibe (ZETIA) 10 MG tablet Take 1 tablet (10 mg total) by mouth daily. 90 tablet 3  . feeding supplement, ENSURE COMPLETE, (ENSURE COMPLETE) LIQD Take 237 mLs by mouth 3 (three) times daily  between meals.    Marland Kitchen losartan (COZAAR) 100 MG tablet Take 1 tablet (100 mg total) by mouth daily. 90 tablet 3  . metoprolol (LOPRESSOR) 50 MG tablet Take 1.5 tablets (75 mg total) by mouth 2 (two) times daily. Take 75 mg twice daily 270 tablet 3  . Multiple Vitamins-Minerals (SENIOR MULTIVITAMIN PLUS PO) Take 1 tablet by mouth daily.    Marland Kitchen omega-3 acid ethyl esters (LOVAZA) 1 G capsule Take 1 capsule (1 g total) by mouth daily. 90 capsule 3  . pantoprazole (PROTONIX) 40 MG tablet Take 40 mg by mouth 2 (two) times daily.  3  . Pitavastatin Calcium (LIVALO) 2 MG TABS Take 1 tablet (2 mg total) by mouth daily. 90 tablet 3  . timolol (TIMOPTIC) 0.5 % ophthalmic solution Place 1 drop into both eyes 2 (two) times daily.      No current facility-administered medications for this visit.    History   Social History  . Marital Status: Widowed    Spouse Name: N/A  . Number of Children: 4  . Years of Education: N/A   Occupational History  . Not on file.   Social History Main Topics  . Smoking status: Former Smoker    Quit date: 05/07/1984  . Smokeless tobacco: Never Used  . Alcohol Use: No  . Drug Use: No  . Sexual Activity: Not Currently   Other Topics Concern  . Not on file   Social History Narrative    Family History  Problem Relation Age of Onset  . Breast cancer Sister     cervical cancer  . Colon cancer Brother     throat cancer  . Throat cancer Father   . Cancer Mother   . CAD Son   . Pulmonary embolism Daughter   . 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 Redmond Baseman Pulmonary: Negative; No cough, wheezing, shortness of breath, hemoptysis Cardiovascular: See HPI:  Positive for claudication GI: Positive for GERD; No nausea, vomiting, diarrhea, or abdominal  pain GU: Negative; No dysuria, hematuria, or difficulty voiding Musculoskeletal: Negative; no myalgias, joint pain, or weakness Hematologic: Negative; no easy bruising, bleeding Endocrine: Negative; no heat/cold intolerance; no  diabetes, Neuro: Negative; no changes in balance, headaches Skin: Negative; No rashes or skin lesions Psychiatric: Negative; No behavioral problems, depression Sleep: Positive for daytime sleepiness, hypersomnolence, no bruxism, restless legs, hypnogognic hallucinations. Other comprehensive 14 point system review is negative   Physical Exam BP 132/80 mmHg  Ht _0  (1.626 m)  Wt 127 lb (57.607 kg)  BMI 21.79 kg/m2  Repeat blood pressure by me 150/68 supine, 140/64 standing. General: Alert, oriented, no distress.  Skin: normal turgor, no rashes, warm and dry HEENT: Normocephalic, atraumatic. Pupils equal round and reactive to light; sclera anicteric; extraocular muscles intact, No lid lag; Nose without nasal septal hypertrophy; Mouth/Parynx benign; Mallinpatti scale 2 Neck: No JVD, no carotid bruits; normal carotid upstroke; positive for palpable left neck mass which is consistent with Warthin's tumor documented on biopsy. Lungs: clear to ausculatation and percussion bilaterally; no wheezing or rales, normal inspiratory and expiratory effort Chest wall: without tenderness to palpitation Heart: PMI not displaced, RRR, s1 s2 normal, 2/6 systolic murmur left sternal border, No diastolic murmur, no rubs, gallops, thrills, or heaves Abdomen: soft, nontender; no hepatosplenomehaly, BS+; abdominal aorta nontender and not dilated by palpation. Back: no CVA tenderness Pulses: 2+ upper extremity.  1+ distally  Musculoskeletal: full range of motion, normal strength, no joint deformities Extremities: Pulses 2+, no clubbing cyanosis or edema, Homan's sign negative  Neurologic: grossly nonfocal; Cranial nerves grossly wnl Psychologic: Normal mood and affect  ECG  (independently read by me): Sinus bradycardia 55 bpm.  First-degree AV block with a PR interval at 246 ms.  Nonspecific ST-T changes.  November 2015 ECG (independently read by me): sinus bradycardia at 59 bpm with first-degree AV block.  LVH with repolarization changes.  ECG (independently read by me) normal sinus rhythm at 66 beats per minute.  First degree AV block with a PR interval at 236 ms.  June 2015 ECG (independently read by me): Sinus bradycardia 59 beats per minute.  First degree AV block with PR interval 240 ms.  Nonspecific ST-T changes.  LABS: BMP Latest Ref Rng 09/25/2014 08/27/2014 08/24/2014  Glucose 70 - 99 mg/dL 80 110(H) 139(H)  BUN 6 - 23 mg/dL _1 Creatinine 0.50 - 1.10 mg/dL 0.95 0.88 1.03  Sodium 135 - 145 mEq/L 140 140 140  Potassium 3.5 - 5.3 mEq/L 4.9 3.7 4.0  Chloride 96 - 112 mEq/L 102 103 104  CO2 19 - 32 mEq/L _2 Calcium 8.4 - 10.5 mg/dL 10.4 9.4 9.9   Hepatic Function Latest Ref Rng 08/23/2014 05/15/2014 01/24/2013  Total Protein 6.0 - 8.3 g/dL 7.5 6.8 6.9  Albumin 3.5 - 5.2 g/dL 4.0 4.4 3.6  AST 0 - 37 U/L _3 ALT 0 - 35 U/L _4 Alk Phosphatase 39 - 117 U/L 45 42 42  Total Bilirubin 0.3 - 1.2 mg/dL 0.9 1.1 0.7   CBC Latest Ref Rng 08/23/2014 05/15/2014 01/27/2013  WBC 4.0 - 10.5 K/uL 13.3(H) 5.9 -  Hemoglobin 12.0 - 15.0 g/dL 14.4 14.1 11.5(L)  Hematocrit 36.0 - 46.0 % 43.0 42.2 34.0(L)  Platelets 150 - 400 K/uL 172 201 -   Lab Results  Component Value Date   TSH 2.203 05/15/2014   Lipid Panel     Component Value Date/Time   CHOL 215* 08/24/2014 0120   TRIG 99 08/24/2014 0120   HDL 82 08/24/2014 0120   CHOLHDL 2.6 08/24/2014 0120   VLDL 20 08/24/2014 0120   LDLCALC 113* 08/24/2014 0120  RADIOLOGY: Mm Digital Screening Bilateral  04/25/2014   CLINICAL DATA:  Screening.  EXAM: DIGITAL SCREENING BILATERAL MAMMOGRAM WITH CAD  COMPARISON:  02/17/2012, 09/22/2010.  ACR Breast Density Category b: There are scattered areas  of fibroglandular density.  FINDINGS: In the right breast, skin thickening warrants further evaluation with physical examination and possibly ultrasound. In the left breast, no findings suspicious for malignancy. Images were processed with CAD.  IMPRESSION: Further evaluation is suggested for possible skin thickening in the right breast.  RECOMMENDATION: Physical examination and possibly ultrasound of the right breast. (Code:FI-R-69M)  The patient will be contacted regarding the findings, and additional imaging will be scheduled.  BI-RADS CATEGORY  0: Incomplete. Need additional imaging evaluation and/or prior mammograms for comparison.   Electronically Signed   By: Luberta Robertson M.D.   On: 04/25/2014 07:41   US Breast Ltd Uni Right Inc Axilla  05/08/2014   CLINICAL DATA:  Screening callback for right breast skin thickening asymmetrically in the inferior breast. The patient denies a history of congestive heart failure. She had right shoulder surgery approximately 1 year ago but denies right arm asymmetric edema.  EXAM: ULTRASOUND OF THE right BREAST  COMPARISON:  Prior mammograms  FINDINGS: On physical exam, I palpate no abnormality in the left breast 6 o'clock/ inferior breast. There is no skin thickening, induration, or erythema.  Ultrasound is performed, showing minimal skin thickening in the right inferior breast measuring 3 mm as compared to the right breast 12 o'clock location measuring 2 mm in maximal thickness. No underlying intramammary abnormality is identified.  IMPRESSION: Minimal asymmetric prominence of the right inferior breast skin without underlying intramammary abnormality. There is no evidence for active infection or other dermatologic abnormality. This is of unclear clinical significance and clinical followup is recommended as part of the patient's routine healthcare maintenance.  RECOMMENDATION: Screening mammogram in one year.(Code:SM-B-01Y)  I have discussed the findings and  recommendations with the patient. Results were also provided in writing at the conclusion of the visit. If applicable, a reminder letter will be sent to the patient regarding the next appointment.  BI-RADS CATEGORY  1: Negative.   Electronically Signed   By: Conchita Paris M.D.   On: 05/08/2014 15:34       ASSESSMENT AND PLAN: JulieWingler is an 81 year old female with a history of hypertension, moderate concentric left ventricular hypertrophy with LVOT gradient at the midcavity level of 10 mm at her previous echo evaluation in 2013 which was not detected on her most last echo study.  She has severely calcified mitral annular calcification and mild pulmonary hypertension.  She is wearing a loop recorder which was placed after sustaining a syncopal spell.  She denies any awareness of tachycardia palpitations.  She has difficulty with walking where her legs give out, which she was evaluated by neurology.  I reviewed the neurologist records.  It is possible that she may not have had overt syncope but instead may of just falling due to her legs.  She is felt to have moderate dementia.  She is no longer able to drive due to reduced reaction time.  Her ECG shows sinus rhythm, mildly bradycardic at 55, for which she is asymptomatic.  On her increased amlodipine dose, which was titrated at home her blood pressure today remains stable.  She had laboratory done this morning.  These are not yet available for my review.  I will review these and make adjustments to her medical regimen if necessary. She denies recent claudication  symptoms.  I will see her in 6 months for reevaluation or sooner if problems arise.   Troy Sine, MD, [facility]  03/14/2015 1:48 PM

## 2015-03-14 NOTE — Patient Instructions (Signed)
Your physician wants you to follow-up in: 6 months with or sooner if needed with Dr. Claiborne Billings. You will receive a reminder letter in the mail two months in advance. If you don't receive a letter, please call our office to schedule the follow-up appointment.

## 2015-03-15 LAB — COMPREHENSIVE METABOLIC PANEL
ALT: 20 U/L (ref 0–35)
AST: 18 U/L (ref 0–37)
Albumin: 4.3 g/dL (ref 3.5–5.2)
Alkaline Phosphatase: 43 U/L (ref 39–117)
BUN: 21 mg/dL (ref 6–23)
CO2: 29 mEq/L (ref 19–32)
Calcium: 10 mg/dL (ref 8.4–10.5)
Chloride: 105 mEq/L (ref 96–112)
Creat: 0.98 mg/dL (ref 0.50–1.10)
Glucose, Bld: 91 mg/dL (ref 70–99)
Potassium: 4.8 mEq/L (ref 3.5–5.3)
Sodium: 143 mEq/L (ref 135–145)
Total Bilirubin: 0.9 mg/dL (ref 0.2–1.2)
Total Protein: 6.8 g/dL (ref 6.0–8.3)

## 2015-03-15 LAB — LIPID PANEL
Cholesterol: 190 mg/dL (ref 0–200)
HDL: 87 mg/dL (ref >=46)
LDL Cholesterol: 83 mg/dL (ref 0–99)
Total CHOL/HDL Ratio: 2.2 Ratio
Triglycerides: 100 mg/dL (ref <150)
VLDL: 20 mg/dL (ref 0–40)

## 2015-03-15 LAB — CBC
HCT: 44.9 % (ref 36.0–46.0)
Hemoglobin: 14.4 g/dL (ref 12.0–15.0)
MCH: 30.3 pg (ref 26.0–34.0)
MCHC: 32.1 g/dL (ref 30.0–36.0)
MCV: 94.5 fL (ref 78.0–100.0)
MPV: 10.2 fL (ref 8.6–12.4)
Platelets: 198 10*3/uL (ref 150–400)
RBC: 4.75 MIL/uL (ref 3.87–5.11)
RDW: 13.7 % (ref 11.5–15.5)
WBC: 8.8 10*3/uL (ref 4.0–10.5)

## 2015-03-24 ENCOUNTER — Other Ambulatory Visit: Payer: Self-pay | Admitting: *Deleted

## 2015-03-24 MED ORDER — PITAVASTATIN CALCIUM 2 MG PO TABS: 1.0000 | ORAL_TABLET | Freq: Every day | ORAL | Status: DC

## 2015-03-24 MED ORDER — AMLODIPINE BESYLATE 5 MG PO TABS: 5.0000 mg | ORAL_TABLET | Freq: Every day | ORAL | Status: DC

## 2015-03-25 LAB — CUP PACEART REMOTE DEVICE CHECK: Date Time Interrogation Session: 20160510140916

## 2015-03-26 ENCOUNTER — Ambulatory Visit (INDEPENDENT_AMBULATORY_CARE_PROVIDER_SITE_OTHER): Payer: Medicare Other | Admitting: *Deleted

## 2015-03-26 DIAGNOSIS — R55 Syncope and collapse: Secondary | ICD-10-CM

## 2015-03-28 NOTE — Progress Notes (Signed)
Loop recorder 

## 2015-04-03 ENCOUNTER — Ambulatory Visit: Payer: Medicare Other | Admitting: Diagnostic Neuroimaging

## 2015-04-04 ENCOUNTER — Encounter: Payer: Self-pay | Admitting: Cardiovascular Disease

## 2015-04-09 ENCOUNTER — Ambulatory Visit: Payer: Self-pay | Admitting: Diagnostic Neuroimaging

## 2015-04-15 LAB — CUP PACEART REMOTE DEVICE CHECK: Date Time Interrogation Session: 20160531164733

## 2015-04-25 ENCOUNTER — Ambulatory Visit (INDEPENDENT_AMBULATORY_CARE_PROVIDER_SITE_OTHER): Payer: Medicare Other | Admitting: *Deleted

## 2015-04-25 DIAGNOSIS — R55 Syncope and collapse: Secondary | ICD-10-CM

## 2015-04-25 NOTE — Progress Notes (Signed)
Loop recorder 

## 2015-04-28 ENCOUNTER — Encounter: Payer: Self-pay | Admitting: Cardiovascular Disease

## 2015-04-28 LAB — CUP PACEART REMOTE DEVICE CHECK: Date Time Interrogation Session: 20160613155422

## 2015-05-09 ENCOUNTER — Encounter: Payer: Self-pay | Admitting: Cardiovascular Disease

## 2015-05-22 ENCOUNTER — Telehealth (HOSPITAL_COMMUNITY): Payer: Self-pay | Admitting: *Deleted

## 2015-05-26 ENCOUNTER — Ambulatory Visit (INDEPENDENT_AMBULATORY_CARE_PROVIDER_SITE_OTHER): Payer: Medicare Other | Admitting: *Deleted

## 2015-05-26 DIAGNOSIS — R55 Syncope and collapse: Secondary | ICD-10-CM

## 2015-05-28 NOTE — Progress Notes (Signed)
Loop recorder 

## 2015-06-12 ENCOUNTER — Other Ambulatory Visit: Payer: Self-pay | Admitting: *Deleted

## 2015-06-12 MED ORDER — CITALOPRAM HYDROBROMIDE 10 MG PO TABS: 10.0000 mg | ORAL_TABLET | Freq: Every day | ORAL | Status: DC

## 2015-06-12 MED ORDER — EZETIMIBE 10 MG PO TABS: 10.0000 mg | ORAL_TABLET | Freq: Every day | ORAL | Status: DC

## 2015-06-23 LAB — CUP PACEART REMOTE DEVICE CHECK
Date Time Interrogation Session: 20160802151217
Zone Setting Detection Interval: 2000 ms
Zone Setting Detection Interval: 3000 ms
Zone Setting Detection Interval: 420 ms

## 2015-06-24 ENCOUNTER — Ambulatory Visit (INDEPENDENT_AMBULATORY_CARE_PROVIDER_SITE_OTHER): Payer: Medicare Other | Admitting: *Deleted

## 2015-06-24 DIAGNOSIS — R55 Syncope and collapse: Secondary | ICD-10-CM

## 2015-06-24 LAB — CUP PACEART REMOTE DEVICE CHECK: Date Time Interrogation Session: 20160809123327

## 2015-06-24 NOTE — Progress Notes (Signed)
Loop recorder 

## 2015-07-10 ENCOUNTER — Encounter: Payer: Self-pay | Admitting: Cardiovascular Disease

## 2015-07-10 DIAGNOSIS — H1820 Unspecified corneal edema: Secondary | ICD-10-CM | POA: Diagnosis not present

## 2015-07-10 DIAGNOSIS — H4011X3 Primary open-angle glaucoma, severe stage: Secondary | ICD-10-CM | POA: Diagnosis not present

## 2015-07-10 DIAGNOSIS — Z961 Presence of intraocular lens: Secondary | ICD-10-CM | POA: Diagnosis not present

## 2015-07-24 ENCOUNTER — Ambulatory Visit (INDEPENDENT_AMBULATORY_CARE_PROVIDER_SITE_OTHER): Payer: Medicare Other | Admitting: *Deleted

## 2015-07-24 DIAGNOSIS — R55 Syncope and collapse: Secondary | ICD-10-CM

## 2015-07-24 NOTE — Progress Notes (Signed)
Loop recorder 

## 2015-07-28 ENCOUNTER — Encounter: Payer: Self-pay | Admitting: Cardiovascular Disease

## 2015-08-02 LAB — CUP PACEART REMOTE DEVICE CHECK: Date Time Interrogation Session: 20160917165200

## 2015-08-02 NOTE — Progress Notes (Signed)
Carelink summary report received. Battery status OK. Normal device function. No new symptom episodes, tachy episodes, brady, or pause episodes. No new AF episodes, +ASA 81mg  and Plavix. Monthly summary reports and ROV with [place] in 08/2015.

## 2015-08-25 ENCOUNTER — Ambulatory Visit (INDEPENDENT_AMBULATORY_CARE_PROVIDER_SITE_OTHER): Payer: Medicare Other | Admitting: *Deleted

## 2015-08-25 DIAGNOSIS — R55 Syncope and collapse: Secondary | ICD-10-CM

## 2015-08-26 NOTE — Progress Notes (Signed)
LOOP RECORDER  

## 2015-09-01 ENCOUNTER — Encounter: Payer: Self-pay | Admitting: Cardiovascular Disease

## 2015-09-02 LAB — CUP PACEART REMOTE DEVICE CHECK: Date Time Interrogation Session: 20161008050607

## 2015-09-02 NOTE — Progress Notes (Signed)
Carelink summary report received. Battery status OK. Normal device function. No new symptom episodes, tachy episodes, brady, or pause episodes. No new AF episodes. Monthly summary reports and ROV with [place] in 08/2015.

## 2015-09-16 ENCOUNTER — Encounter: Payer: Self-pay | Admitting: *Deleted

## 2015-09-18 DIAGNOSIS — H401133 Primary open-angle glaucoma, bilateral, severe stage: Secondary | ICD-10-CM | POA: Diagnosis not present

## 2015-09-22 ENCOUNTER — Ambulatory Visit (INDEPENDENT_AMBULATORY_CARE_PROVIDER_SITE_OTHER): Payer: Medicare Other | Admitting: *Deleted

## 2015-09-22 DIAGNOSIS — R55 Syncope and collapse: Secondary | ICD-10-CM | POA: Diagnosis not present

## 2015-09-22 NOTE — Progress Notes (Signed)
Carelink Summary Report / Loop recorder 

## 2015-09-23 ENCOUNTER — Encounter: Payer: Self-pay | Admitting: Cardiovascular Disease

## 2015-10-08 ENCOUNTER — Telehealth: Payer: Self-pay | Admitting: Family Medicine

## 2015-10-08 NOTE — Telephone Encounter (Signed)
Called pt daughter to try and set up appt for a med check since pt has not been in a while, daughter said the heart dr refills her heart RX and checks her BP and the pt has not been sick, says she has she has a hard time getting her out of the house to dr appt so that is one reason why she has not been back in the dr office, pt daughter would call back if needed or would like to schedule a appt.

## 2015-10-17 ENCOUNTER — Ambulatory Visit: Payer: Medicare Other | Admitting: Cardiovascular Disease

## 2015-10-19 LAB — CUP PACEART REMOTE DEVICE CHECK: Date Time Interrogation Session: 20161107050622

## 2015-10-19 NOTE — Progress Notes (Signed)
Carelink summary report received. Battery status OK. Normal device function. No new symptom episodes, tachy episodes, brady, or pause episodes. No new AF episodes. Monthly summary reports and ROV with [facility] overdue as of 08/2015 (recall letter sent).

## 2015-10-22 ENCOUNTER — Ambulatory Visit (INDEPENDENT_AMBULATORY_CARE_PROVIDER_SITE_OTHER): Payer: Medicare Other | Admitting: *Deleted

## 2015-10-22 DIAGNOSIS — R55 Syncope and collapse: Secondary | ICD-10-CM | POA: Diagnosis not present

## 2015-10-22 NOTE — Progress Notes (Signed)
Carelink Summary Report / Loop Recorder 

## 2015-11-21 ENCOUNTER — Ambulatory Visit (INDEPENDENT_AMBULATORY_CARE_PROVIDER_SITE_OTHER): Payer: Medicare Other | Admitting: *Deleted

## 2015-11-21 DIAGNOSIS — R55 Syncope and collapse: Secondary | ICD-10-CM

## 2015-11-21 NOTE — Progress Notes (Signed)
Carelink Summary Report / Loop Recorder 

## 2015-12-16 LAB — CUP PACEART REMOTE DEVICE CHECK: Date Time Interrogation Session: 20161207053622

## 2015-12-18 ENCOUNTER — Encounter: Payer: Self-pay | Admitting: Family Medicine

## 2015-12-18 ENCOUNTER — Ambulatory Visit (INDEPENDENT_AMBULATORY_CARE_PROVIDER_SITE_OTHER): Payer: Medicare Other | Admitting: Family Medicine

## 2015-12-18 VITALS — BP 116/70 | HR 74 | Wt 124.0 lb

## 2015-12-18 DIAGNOSIS — Q394 Esophageal web: Secondary | ICD-10-CM | POA: Diagnosis not present

## 2015-12-18 DIAGNOSIS — K222 Esophageal obstruction: Secondary | ICD-10-CM

## 2015-12-18 DIAGNOSIS — R221 Localized swelling, mass and lump, neck: Secondary | ICD-10-CM | POA: Diagnosis not present

## 2015-12-18 DIAGNOSIS — R4702 Dysphasia: Secondary | ICD-10-CM | POA: Diagnosis not present

## 2015-12-18 NOTE — Progress Notes (Signed)
Patient has appointment schedulde for Dr Oletta Lamas on 2/3 @ 8:15am & [place] 2/8 @ 3:00

## 2015-12-18 NOTE — Progress Notes (Signed)
   Subjective:    Patient ID: Julie Bass, female    DOB: 11/07/1926, 82 y.o.   MRN: [id number]  HPI She is here for evaluation of a lesion in the left submandibular area that was just noted in the recent past.The lesion is non-tender She also over the last several months is noted difficulty with swallowing solid foods but not liquids. Her daughter indicates she has a previous history of Schatzki's ring and stricture with dilatation. This was done by Dr. Oletta Lamas.   Review of Systems     Objective:   Physical Exam Alert and in no distress. A 3 cm round smooth movable lesion is noted in the left submandibular area. No adenopathy noted. Neck otherwise is normal. Throat is clear. Cardiac exam shows a loud S2 are lungs are clear to auscultation.       Assessment & Plan:  Dysphasia - Plan: Ambulatory referral to Gastroenterology  Schatzki's ring - Plan: Ambulatory referral to Gastroenterology  Neck mass - Plan: US Soft Tissue Head/Neck

## 2015-12-19 ENCOUNTER — Other Ambulatory Visit: Payer: Self-pay | Admitting: Physician Assistant

## 2015-12-19 DIAGNOSIS — R1310 Dysphagia, unspecified: Secondary | ICD-10-CM

## 2015-12-22 ENCOUNTER — Ambulatory Visit (INDEPENDENT_AMBULATORY_CARE_PROVIDER_SITE_OTHER): Payer: Medicare Other | Admitting: *Deleted

## 2015-12-22 DIAGNOSIS — R55 Syncope and collapse: Secondary | ICD-10-CM | POA: Diagnosis not present

## 2015-12-23 NOTE — Progress Notes (Signed)
Carelink Summary Report / Loop Recorder 

## 2015-12-24 ENCOUNTER — Other Ambulatory Visit: Payer: Medicare Other

## 2015-12-26 ENCOUNTER — Other Ambulatory Visit: Payer: Medicare Other

## 2015-12-31 ENCOUNTER — Ambulatory Visit
Admission: RE | Admit: 2015-12-31 | Discharge: 2015-12-31 | Disposition: A | Discharge status: Home / Self Care | Payer: Medicare Other | Source: Ambulatory Visit | Attending: Physician Assistant | Admitting: Physician Assistant

## 2015-12-31 ENCOUNTER — Ambulatory Visit
Admission: RE | Admit: 2015-12-31 | Discharge: 2015-12-31 | Disposition: A | Discharge status: Home / Self Care | Payer: Medicare Other | Source: Ambulatory Visit | Attending: Family Medicine | Admitting: Family Medicine

## 2015-12-31 DIAGNOSIS — R1310 Dysphagia, unspecified: Secondary | ICD-10-CM | POA: Diagnosis not present

## 2015-12-31 DIAGNOSIS — R221 Localized swelling, mass and lump, neck: Secondary | ICD-10-CM | POA: Diagnosis not present

## 2015-12-31 DIAGNOSIS — K449 Diaphragmatic hernia without obstruction or gangrene: Secondary | ICD-10-CM | POA: Diagnosis not present

## 2016-01-01 ENCOUNTER — Encounter: Payer: Self-pay | Admitting: Family Medicine

## 2016-01-01 DIAGNOSIS — D119 Benign neoplasm of major salivary gland, unspecified: Secondary | ICD-10-CM | POA: Insufficient documentation

## 2016-01-01 NOTE — Progress Notes (Signed)
   Subjective:    Patient ID: Julie Bass, female    DOB: [birth year], 82 y.o.   MRN: [id number]  HPI    Review of Systems     Objective:   Physical Exam        Assessment & Plan:  A recent ultrasound evaluation showed a slight enlargement of her tumor. Apparently she was seen about 6 months ago by ENT for reevaluation of this. She had a biopsy of this in 2013 which did show evidence of a [place] tumor. Occasionally complained about this however no intervention at this point as needed. This was discussed with her daughter who is her caregiver. Encounter Diagnosis  Name Primary?  . Warthin's tumor Yes

## 2016-01-02 LAB — CUP PACEART REMOTE DEVICE CHECK: Date Time Interrogation Session: 20170106060818

## 2016-01-02 NOTE — Progress Notes (Signed)
Carelink summary report received. Battery status OK. Normal device function. No new symptom episodes, tachy episodes, brady, or pause episodes. No new AF episodes. Monthly summary reports and ROV/PRN 

## 2016-01-20 ENCOUNTER — Ambulatory Visit (INDEPENDENT_AMBULATORY_CARE_PROVIDER_SITE_OTHER): Payer: Medicare Other | Admitting: *Deleted

## 2016-01-20 DIAGNOSIS — R55 Syncope and collapse: Secondary | ICD-10-CM | POA: Diagnosis not present

## 2016-01-20 LAB — CUP PACEART REMOTE DEVICE CHECK: Date Time Interrogation Session: 20170205063605

## 2016-01-20 NOTE — Progress Notes (Signed)
Carelink summary report received. Battery status OK. Normal device function. No new symptom episodes, tachy episodes, brady, or pause episodes. No new AF episodes. Monthly summary reports and ROV/PRN 

## 2016-01-23 NOTE — Progress Notes (Signed)
Carelink Summary Report / Loop Recorder 

## 2016-01-24 LAB — CUP PACEART REMOTE DEVICE CHECK: Date Time Interrogation Session: 20170307070653

## 2016-01-24 NOTE — Progress Notes (Signed)
Carelink summary report received. Battery status OK. Normal device function. No new symptom episodes, tachy episodes, brady, or pause episodes. No new AF episodes. Monthly summary reports and ROV/PRN 

## 2016-02-09 ENCOUNTER — Other Ambulatory Visit: Payer: Self-pay | Admitting: Cardiovascular Disease

## 2016-02-09 MED ORDER — CLOPIDOGREL BISULFATE 75 MG PO TABS: 75.0000 mg | ORAL_TABLET | Freq: Every day | ORAL | Status: DC

## 2016-02-09 MED ORDER — LOSARTAN POTASSIUM 100 MG PO TABS: 100.0000 mg | ORAL_TABLET | Freq: Every day | ORAL | Status: DC

## 2016-02-09 MED ORDER — METOPROLOL TARTRATE 50 MG PO TABS
75.0000 mg | ORAL_TABLET | Freq: Two times a day (BID) | ORAL | Status: DC
Start: 2016-02-09 — End: 2016-09-28

## 2016-02-09 MED ORDER — PANTOPRAZOLE SODIUM 40 MG PO TBEC: 40.0000 mg | DELAYED_RELEASE_TABLET | Freq: Two times a day (BID) | ORAL | Status: DC

## 2016-02-09 NOTE — Telephone Encounter (Signed)
Rx request sent to pharmacy.  

## 2016-02-19 ENCOUNTER — Ambulatory Visit (INDEPENDENT_AMBULATORY_CARE_PROVIDER_SITE_OTHER): Payer: Medicare Other | Admitting: *Deleted

## 2016-02-19 DIAGNOSIS — H401133 Primary open-angle glaucoma, bilateral, severe stage: Secondary | ICD-10-CM | POA: Diagnosis not present

## 2016-02-19 DIAGNOSIS — Z0100 Encounter for examination of eyes and vision without abnormal findings: Secondary | ICD-10-CM | POA: Diagnosis not present

## 2016-02-19 DIAGNOSIS — R55 Syncope and collapse: Secondary | ICD-10-CM

## 2016-02-19 NOTE — Progress Notes (Signed)
Carelink Summary Report / Loop Recorder 

## 2016-03-22 ENCOUNTER — Ambulatory Visit (INDEPENDENT_AMBULATORY_CARE_PROVIDER_SITE_OTHER): Payer: Medicare Other | Admitting: *Deleted

## 2016-03-22 DIAGNOSIS — R55 Syncope and collapse: Secondary | ICD-10-CM | POA: Diagnosis not present

## 2016-03-23 NOTE — Progress Notes (Signed)
Carelink Summary Report / Loop Recorder 

## 2016-04-09 ENCOUNTER — Other Ambulatory Visit: Payer: Self-pay | Admitting: *Deleted

## 2016-04-09 MED ORDER — LOSARTAN POTASSIUM 100 MG PO TABS: 100.0000 mg | ORAL_TABLET | Freq: Every day | ORAL | Status: DC

## 2016-04-09 MED ORDER — CLOPIDOGREL BISULFATE 75 MG PO TABS: 75.0000 mg | ORAL_TABLET | Freq: Every day | ORAL | Status: DC

## 2016-04-10 LAB — CUP PACEART REMOTE DEVICE CHECK: Date Time Interrogation Session: 20170406073738

## 2016-04-10 NOTE — Progress Notes (Signed)
Carelink summary report received. Battery status OK. Normal device function. No new symptom episodes, tachy episodes, brady episodes. No new AF episodes. 2 pause episodes, undersensing. Monthly summary reports and ROV/PRN

## 2016-04-19 ENCOUNTER — Ambulatory Visit (INDEPENDENT_AMBULATORY_CARE_PROVIDER_SITE_OTHER): Payer: Medicare Other | Admitting: *Deleted

## 2016-04-19 DIAGNOSIS — R55 Syncope and collapse: Secondary | ICD-10-CM

## 2016-04-19 NOTE — Progress Notes (Signed)
Carelink Summary Report / Loop Recorder 

## 2016-04-29 LAB — CUP PACEART REMOTE DEVICE CHECK: Date Time Interrogation Session: 20170506082043

## 2016-05-14 ENCOUNTER — Other Ambulatory Visit: Payer: Self-pay | Admitting: *Deleted

## 2016-05-14 MED ORDER — PITAVASTATIN CALCIUM 2 MG PO TABS: 1.0000 | ORAL_TABLET | Freq: Every day | ORAL | Status: DC

## 2016-05-14 NOTE — Telephone Encounter (Signed)
Rx has been sent to the pharmacy electronically. ° °

## 2016-05-19 ENCOUNTER — Ambulatory Visit (INDEPENDENT_AMBULATORY_CARE_PROVIDER_SITE_OTHER): Payer: Medicare Other | Admitting: *Deleted

## 2016-05-19 DIAGNOSIS — R55 Syncope and collapse: Secondary | ICD-10-CM | POA: Diagnosis not present

## 2016-05-19 NOTE — Progress Notes (Signed)
Carelink Summary Report / Loop Recorder 

## 2016-06-01 LAB — CUP PACEART REMOTE DEVICE CHECK: Date Time Interrogation Session: 20170705090627

## 2016-06-02 ENCOUNTER — Other Ambulatory Visit: Payer: Self-pay | Admitting: Cardiovascular Disease

## 2016-06-08 ENCOUNTER — Ambulatory Visit (INDEPENDENT_AMBULATORY_CARE_PROVIDER_SITE_OTHER): Payer: Medicare Other | Admitting: Cardiovascular Disease

## 2016-06-08 ENCOUNTER — Encounter: Payer: Self-pay | Admitting: Cardiovascular Disease

## 2016-06-08 VITALS — BP 110/60 | HR 60 | Ht 64.0 in | Wt 128.0 lb

## 2016-06-08 DIAGNOSIS — I739 Peripheral vascular disease, unspecified: Secondary | ICD-10-CM | POA: Diagnosis not present

## 2016-06-08 DIAGNOSIS — R5383 Other fatigue: Secondary | ICD-10-CM | POA: Diagnosis not present

## 2016-06-08 DIAGNOSIS — E785 Hyperlipidemia, unspecified: Secondary | ICD-10-CM

## 2016-06-08 NOTE — Patient Instructions (Addendum)
Medication Instructions:  Your physician recommends that you continue on your current medications as directed. Please refer to the Current Medication list given to you today.  Labwork: Your physician recommends that you return for fasting lab work for LIPID, CMP, CBC. Complete vitamin d today.   Testing/Procedures: Your physician has requested that you have a lower extremity arterial doppler. This test is an ultrasound of the arteries in the legs. It looks at arterial blood flow in the legs. Allow one hour for Lower Arterial scans. There are no restrictions or special instructions  Follow-Up: Your physician wants you to follow-up in: 1 year with Dr Fletcher Anon. You will receive a reminder letter in the mail two months in advance. If you don't receive a letter, please call our office to schedule the follow-up appointment.   Any Other Special Instructions Will Be Listed Below (If Applicable).     If you need a refill on your cardiac medications before your next appointment, please call your pharmacy.

## 2016-06-08 NOTE — Progress Notes (Signed)
Cardiology Office Note   Date:  06/08/2016   ID:  Julie Bass, DOB 15-Jan-1926, MRN [id number]  PCP:  [name], MD  Cardiologist:  Dr. Claiborne Billings  Chief Complaint  Patient presents with  . New Evaluation      History of Present Illness: Julie Bass is a 82 y.o. female who presents To establish care regarding peripheral arterial disease. She is a patient of Dr. Claiborne Billings with known history of ventricular tachycardia treated with a beta blocker, hypertrophic cardiomyopathy with mild LVOT gradient, hypertension, hyperlipidemia and peripheral arterial disease. She is status post left SFA stenting in the past. Previous carotid Doppler showed mild bilateral disease. She also has known history of dementia which has been slowly progressing. She reports right leg weakness with walking without discomfort. Her balance is not normal but there has been no frequent falls. She denies any chest pain or shortness of breath. She has no lower extremity ulceration.   Past Medical History:  Diagnosis Date  . Arthritis   . Chronic kidney disease    KIDNEY STONES  . Dementia    BEGINNING STAGES   . Depression   . GERD (gastroesophageal reflux disease)   . Glaucoma   . Hiatal hernia   . HTN (hypertension) 10/18/2011  . Hyperlipemia 10/18/2011  . NSVT (nonsustained ventricular tachycardia) ([place]) 10/18/2011  . Osteoporosis   . PAD (peripheral artery disease) ([place]) 10/18/2011   h/o left SFA stent    Past Surgical History:  Procedure Laterality Date  . ABDOMINAL HYSTERECTOMY  1973  . CARDIAC CATHETERIZATION     2012  . CHOLECYSTECTOMY    . LOOP RECORDER IMPLANT N/A 08/27/2014   Procedure: LOOP RECORDER IMPLANT;  Surgeon: Coralyn Mark, MD;  Location: [facility] CATH LAB;  Service: Cardiovascular;  Laterality: N/A;  . LOWER EXTREMITY ANGIOGRAM  11/02/2007   stent of mid left SFA with 6x178mm EV3 self-expanding stent and 6x3 in prox region (Dr. Adora Fridge)  . LOWER EXTREMITY ANGIOGRAM    .  LOWER EXTREMITY ARTERIAL DOPPLER  2013   right SFA with 50-69% diameter reduction, right PTA/peroneal occluded, L SFA prox to stent has narrowing with increased velocities >60% diameter reduction, L SFA stent patent, L ATA w/occlusive disease, bilat ABIs show mild arterial insuffiency at rest  . NM MYOCAR PERF WALL MOTION  10/2011   non-gated - normal stuy, EF 82%, normal LV wall motion  . REVERSE SHOULDER ARTHROPLASTY Right 01/26/2013   Procedure: RIGHT REVERSE TOTAL SHOULDER ARTHROPLASTY;  Surgeon: Augustin Schooling, MD;  Location: [facility];  Service: Orthopedics;  Laterality: Right;  . TRANSTHORACIC ECHOCARDIOGRAM  10/2012   EF 75-70%, mod conc hypertrophy, severely calcified MV annulus, LA mildly dailted, PA peak pressure 16mmHg     Current Outpatient Prescriptions  Medication Sig Dispense Refill  . amLODipine (NORVASC) 5 MG tablet TAKE 1 TABLET (5 MG TOTAL) BY MOUTH DAILY. 90 tablet 0  . aspirin EC 81 MG tablet Take 81 mg by mouth daily.    . citalopram (CELEXA) 10 MG tablet Take 1 tablet (10 mg total) by mouth daily. 90 tablet 3  . clopidogrel (PLAVIX) 75 MG tablet Take 1 tablet (75 mg total) by mouth daily. 90 tablet 3  . ezetimibe (ZETIA) 10 MG tablet Take 1 tablet (10 mg total) by mouth daily. 90 tablet 3  . feeding supplement, ENSURE COMPLETE, (ENSURE COMPLETE) LIQD Take 237 mLs by mouth 3 (three) times daily between meals.    Javier Docker Oil 1000 MG  CAPS Take 1 capsule by mouth continuous dialysis.    Marland Kitchen LIVALO 2 MG TABS TAKE 1 TABLET (2 MG TOTAL) BY MOUTH DAILY. 90 tablet 0  . losartan (COZAAR) 100 MG tablet Take 1 tablet (100 mg total) by mouth daily. 90 tablet 3  . metoprolol (LOPRESSOR) 50 MG tablet Take 1.5 tablets (75 mg total) by mouth 2 (two) times daily. Take 75 mg twice daily 270 tablet 3  . Multiple Vitamins-Minerals (SENIOR MULTIVITAMIN PLUS PO) Take 1 tablet by mouth daily.    . pantoprazole (PROTONIX) 40 MG tablet Take 1 tablet (40 mg total) by mouth 2 (two) times daily. 180  tablet 3  . Pitavastatin Calcium (LIVALO) 2 MG TABS Take 1 tablet (2 mg total) by mouth daily. 90 tablet 3  . timolol (TIMOPTIC) 0.5 % ophthalmic solution Place 1 drop into both eyes 2 (two) times daily.      No current facility-administered medications for this visit.     Allergies:   Crestor [rosuvastatin calcium] and Lipitor [atorvastatin calcium]    Social History:  The patient  reports that she quit smoking about 32 years ago. She has never used smokeless tobacco. She reports that she does not drink alcohol or use drugs.   Family History:  The patient's family history includes Breast cancer in her sister; CAD in her son; Cancer in her mother; Colon cancer in her brother; Hypertension in her daughter; Pulmonary embolism in her daughter; Throat cancer in her father.    ROS:  Please see the history of present illness.   Otherwise, review of systems are positive for none.   All other systems are reviewed and negative.    PHYSICAL EXAM: VS:  BP 110/60 (BP Location: Right Arm, Patient Position: Sitting, Cuff Size: Normal)   Pulse 60   Ht 5\' 4"  (1.626 m)   Wt 128 lb (58.1 kg)   BMI 21.97 kg/m  , BMI Body mass index is 21.97 kg/m. GEN: Well nourished, well developed, in no acute distress  HEENT: normal  Neck: no JVD, carotid bruits, or masses Cardiac: RRR; no murmurs, rubs, or gallops,no edema  Respiratory:  clear to auscultation bilaterally, normal work of breathing GI: soft, nontender, nondistended, + BS MS: no deformity or atrophy  Skin: warm and dry, no rash Neuro:  Strength and sensation are intact Psych: euthymic mood, full affect vascular: Femoral pulses normal on the right side and mildly diminished on the left side. Distal pulses are not palpable.  EKG:  EKG is ordered today. The ekg ordered today demonstrates normal sinus rhythm with first-degree AV block. Nonspecific ST and T wave changes.   Recent Labs: No results found for requested labs within last 8760 hours.     Lipid Panel    Component Value Date/Time   CHOL 190 03/14/2015 1034   TRIG 100 03/14/2015 1034   HDL 87 03/14/2015 1034   CHOLHDL 2.2 03/14/2015 1034   VLDL 20 03/14/2015 1034   LDLCALC 83 03/14/2015 1034      Wt Readings from Last 3 Encounters:  06/08/16 128 lb (58.1 kg)  12/18/15 124 lb (56.2 kg)  03/14/15 127 lb (57.6 kg)        ASSESSMENT AND PLAN:  1.  Peripheral artery disease with previous stenting of the left SFA. Operative report is not available. Most recent noninvasive vascular evaluation in 2015 showed an ABI of 0.91 on the right and 0.89 on the left. Duplex showed moderate right SFA disease with 1 vessel runoff below  the knee. Left SFA stent was patent with moderate restenosis with 1 vessel runoff below the knee. Current symptoms include right leg weakness which could be atypical claudication. I requested lower extremity arterial Doppler to compare with the most recent one. Given her age and comorbidities, we should have high threshold to any invasive procedures.  2. Essential hypertension: Blood pressure is well controlled.  3. Hyperlipidemia: Currently on Livalo with intolerance to statins. She is due for a fasting lipid and liver profile. This was requested. Given symptoms of fatigue and weakness, I also requested vitamin D level at the request of her daughter.    Disposition:   FU with me in 1 year  Signed,  Kathlyn Sacramento, MD  06/08/2016 10:17 AM    [place]

## 2016-06-09 LAB — VITAMIN D 25 HYDROXY (VIT D DEFICIENCY, FRACTURES): Vit D, 25-Hydroxy: 29 ng/mL — ABNORMAL LOW (ref 30–100)

## 2016-06-10 ENCOUNTER — Other Ambulatory Visit: Payer: Self-pay | Admitting: Cardiovascular Disease

## 2016-06-10 DIAGNOSIS — I739 Peripheral vascular disease, unspecified: Secondary | ICD-10-CM

## 2016-06-13 ENCOUNTER — Emergency Department (HOSPITAL_COMMUNITY)
Admission: EM | Admit: 2016-06-13 | Discharge: 2016-06-14 | Disposition: A | Discharge status: Home / Self Care | Payer: Medicare Other | Source: Home / Self Care | Attending: Emergency Medicine | Admitting: Emergency Medicine

## 2016-06-13 ENCOUNTER — Emergency Department (HOSPITAL_COMMUNITY): Payer: Medicare Other

## 2016-06-13 ENCOUNTER — Encounter (HOSPITAL_COMMUNITY): Payer: Self-pay | Admitting: Emergency Medicine

## 2016-06-13 DIAGNOSIS — F0390 Unspecified dementia without behavioral disturbance: Secondary | ICD-10-CM | POA: Diagnosis present

## 2016-06-13 DIAGNOSIS — G8929 Other chronic pain: Secondary | ICD-10-CM | POA: Diagnosis present

## 2016-06-13 DIAGNOSIS — I739 Peripheral vascular disease, unspecified: Secondary | ICD-10-CM | POA: Diagnosis present

## 2016-06-13 DIAGNOSIS — I10 Essential (primary) hypertension: Secondary | ICD-10-CM | POA: Diagnosis not present

## 2016-06-13 DIAGNOSIS — Z808 Family history of malignant neoplasm of other organs or systems: Secondary | ICD-10-CM | POA: Diagnosis not present

## 2016-06-13 DIAGNOSIS — Z888 Allergy status to other drugs, medicaments and biological substances status: Secondary | ICD-10-CM | POA: Diagnosis not present

## 2016-06-13 DIAGNOSIS — R7881 Bacteremia: Secondary | ICD-10-CM | POA: Diagnosis not present

## 2016-06-13 DIAGNOSIS — Z800 Family history of malignant neoplasm of digestive organs: Secondary | ICD-10-CM

## 2016-06-13 DIAGNOSIS — H409 Unspecified glaucoma: Secondary | ICD-10-CM | POA: Diagnosis not present

## 2016-06-13 DIAGNOSIS — N390 Urinary tract infection, site not specified: Secondary | ICD-10-CM

## 2016-06-13 DIAGNOSIS — I129 Hypertensive chronic kidney disease with stage 1 through stage 4 chronic kidney disease, or unspecified chronic kidney disease: Secondary | ICD-10-CM

## 2016-06-13 DIAGNOSIS — R531 Weakness: Secondary | ICD-10-CM | POA: Diagnosis not present

## 2016-06-13 DIAGNOSIS — Z87442 Personal history of urinary calculi: Secondary | ICD-10-CM | POA: Diagnosis not present

## 2016-06-13 DIAGNOSIS — Z96611 Presence of right artificial shoulder joint: Secondary | ICD-10-CM | POA: Diagnosis present

## 2016-06-13 DIAGNOSIS — E872 Acidosis: Secondary | ICD-10-CM | POA: Diagnosis present

## 2016-06-13 DIAGNOSIS — K219 Gastro-esophageal reflux disease without esophagitis: Secondary | ICD-10-CM | POA: Diagnosis not present

## 2016-06-13 DIAGNOSIS — Z90710 Acquired absence of both cervix and uterus: Secondary | ICD-10-CM

## 2016-06-13 DIAGNOSIS — B9620 Unspecified Escherichia coli [E. coli] as the cause of diseases classified elsewhere: Secondary | ICD-10-CM | POA: Diagnosis not present

## 2016-06-13 DIAGNOSIS — Z8249 Family history of ischemic heart disease and other diseases of the circulatory system: Secondary | ICD-10-CM

## 2016-06-13 DIAGNOSIS — Z8049 Family history of malignant neoplasm of other genital organs: Secondary | ICD-10-CM

## 2016-06-13 DIAGNOSIS — Z87891 Personal history of nicotine dependence: Secondary | ICD-10-CM | POA: Insufficient documentation

## 2016-06-13 DIAGNOSIS — Z803 Family history of malignant neoplasm of breast: Secondary | ICD-10-CM

## 2016-06-13 DIAGNOSIS — M810 Age-related osteoporosis without current pathological fracture: Secondary | ICD-10-CM | POA: Diagnosis present

## 2016-06-13 DIAGNOSIS — Z7902 Long term (current) use of antithrombotics/antiplatelets: Secondary | ICD-10-CM

## 2016-06-13 DIAGNOSIS — E785 Hyperlipidemia, unspecified: Secondary | ICD-10-CM | POA: Diagnosis not present

## 2016-06-13 DIAGNOSIS — N189 Chronic kidney disease, unspecified: Secondary | ICD-10-CM | POA: Diagnosis present

## 2016-06-13 DIAGNOSIS — F329 Major depressive disorder, single episode, unspecified: Secondary | ICD-10-CM | POA: Diagnosis present

## 2016-06-13 DIAGNOSIS — M549 Dorsalgia, unspecified: Secondary | ICD-10-CM | POA: Diagnosis present

## 2016-06-13 DIAGNOSIS — Z79899 Other long term (current) drug therapy: Secondary | ICD-10-CM

## 2016-06-13 DIAGNOSIS — Z7982 Long term (current) use of aspirin: Secondary | ICD-10-CM

## 2016-06-13 DIAGNOSIS — R509 Fever, unspecified: Secondary | ICD-10-CM

## 2016-06-13 LAB — CBC WITH DIFFERENTIAL/PLATELET
Basophils Absolute: 0 10*3/uL (ref 0.0–0.1)
Basophils Relative: 0 %
Eosinophils Absolute: 0.1 10*3/uL (ref 0.0–0.7)
Eosinophils Relative: 1 %
HCT: 40.6 % (ref 36.0–46.0)
Hemoglobin: 12.8 g/dL (ref 12.0–15.0)
Lymphocytes Relative: 9 %
Lymphs Abs: 0.8 10*3/uL (ref 0.7–4.0)
MCH: 30.5 pg (ref 26.0–34.0)
MCHC: 31.5 g/dL (ref 30.0–36.0)
MCV: 96.9 fL (ref 78.0–100.0)
Monocytes Absolute: 1 10*3/uL (ref 0.1–1.0)
Monocytes Relative: 11 %
Neutro Abs: 7.4 10*3/uL (ref 1.7–7.7)
Neutrophils Relative %: 79 %
Platelets: 134 10*3/uL — ABNORMAL LOW (ref 150–400)
RBC: 4.19 MIL/uL (ref 3.87–5.11)
RDW: 13.5 % (ref 11.5–15.5)
WBC: 9.3 10*3/uL (ref 4.0–10.5)

## 2016-06-13 LAB — URINALYSIS, ROUTINE W REFLEX MICROSCOPIC
Bilirubin Urine: NEGATIVE
Glucose, UA: NEGATIVE mg/dL
Ketones, ur: NEGATIVE mg/dL
Nitrite: POSITIVE — AB
Protein, ur: 30 mg/dL — AB
Specific Gravity, Urine: 1.013 (ref 1.005–1.030)
pH: 6 (ref 5.0–8.0)

## 2016-06-13 LAB — URINE MICROSCOPIC-ADD ON

## 2016-06-13 NOTE — ED Notes (Signed)
Taken to xray at this time. 

## 2016-06-13 NOTE — ED Provider Notes (Signed)
Emergency Department Provider Note   I have reviewed the triage vital signs and the nursing notes.   HISTORY  Chief Complaint Fever   HPI Julie Bass is a 82 y.o. female with PMH of CKD, HTN, HLD, PAD presents to the ED for evaluation of diaphoresis and fever. Patient reports "feeling fine" with only a mild runny nose for the last 2-3 days. Daughter at bedside reports that her son was at the house this evening and noted that the [facility] had been turned off for several days because she was feeling cold. He noticed some diaphoresis and became concerned enough to call 911. On arrival EMS checked a temp which was 102 F. They gave Tylenol en route. Patient denies any HA, neck pain, cough, nausea, vomiting, diarrhea, or dysuria. The daughter says that she was complaining of some back pain earlier in the week but not any longer. No rash or open sores reported.   Past Medical History:  Diagnosis Date  . Arthritis   . Chronic kidney disease    KIDNEY STONES  . Dementia    BEGINNING STAGES   . Depression   . GERD (gastroesophageal reflux disease)   . Glaucoma   . Hiatal hernia   . HTN (hypertension) 10/18/2011  . Hyperlipemia 10/18/2011  . NSVT (nonsustained ventricular tachycardia) ([place]) 10/18/2011  . Osteoporosis   . PAD (peripheral artery disease) ([place]) 10/18/2011   h/o left SFA stent    Patient Active Problem List   Diagnosis Date Noted  . Warthin's tumor 01/01/2016  . Dementia 03/14/2015  . Orthostatic hypotension 08/27/2014  . Physical deconditioning 08/27/2014  . Abdominal pain in female 08/27/2014  . Osteoarthrosis, unspecified whether generalized or localized, shoulder region 01/26/2013  . Chest wall pain 12/26/2012  . Rib contusion 12/26/2012  . NSVT (nonsustained ventricular tachycardia) ([place]) 10/18/2011  . HTN (hypertension) 10/18/2011  . Hyperlipemia 10/18/2011  . PAD (peripheral artery disease) ([place]) 10/18/2011  . Syncope 09/30/2011  . Cough 09/30/2011  . Fatigue  09/30/2011  . Fall 09/30/2011  . Pelvic joint pain 09/30/2011  . Back pain 09/30/2011  . Need for prophylactic vaccination and inoculation against influenza 09/30/2011    Past Surgical History:  Procedure Laterality Date  . ABDOMINAL HYSTERECTOMY  1973  . CARDIAC CATHETERIZATION     2012  . CHOLECYSTECTOMY    . LOOP RECORDER IMPLANT N/A 08/27/2014   Procedure: LOOP RECORDER IMPLANT;  Surgeon: Coralyn Mark, MD;  Location: [facility] CATH LAB;  Service: Cardiovascular;  Laterality: N/A;  . LOWER EXTREMITY ANGIOGRAM  11/02/2007   stent of mid left SFA with 6x141mm EV3 self-expanding stent and 6x3 in prox region (Dr. Adora Fridge)  . LOWER EXTREMITY ANGIOGRAM    . LOWER EXTREMITY ARTERIAL DOPPLER  2013   right SFA with 50-69% diameter reduction, right PTA/peroneal occluded, L SFA prox to stent has narrowing with increased velocities >60% diameter reduction, L SFA stent patent, L ATA w/occlusive disease, bilat ABIs show mild arterial insuffiency at rest  . NM MYOCAR PERF WALL MOTION  10/2011   non-gated - normal stuy, EF 82%, normal LV wall motion  . REVERSE SHOULDER ARTHROPLASTY Right 01/26/2013   Procedure: RIGHT REVERSE TOTAL SHOULDER ARTHROPLASTY;  Surgeon: Augustin Schooling, MD;  Location: [facility];  Service: Orthopedics;  Laterality: Right;  . TRANSTHORACIC ECHOCARDIOGRAM  10/2012   EF 75-70%, mod conc hypertrophy, severely calcified MV annulus, LA mildly dailted, PA peak pressure 98mmHg    Current Outpatient Rx  . Order #: [id number] Class:  Normal  . Order #: [id number] Class: Historical Med  . Order #: [id number] Class: Normal  . Order #: [id number] Class: Normal  . Order #: [id number] Class: Normal  . Order #: [id number] Class: OTC  . Order #: [id number] Class: Historical Med  . Order #: [id number] Class: Normal  . Order #: [id number] Class: Normal  . Order #: [id number] Class: Historical Med  . Order #: [id number] Class: Normal  . Order #: [id number] Class: Normal  . Order #: [id number] Class: Historical Med  .  Order #: [id number] Class: Print    Allergies Crestor [rosuvastatin calcium] and Lipitor [atorvastatin calcium]  Family History  Problem Relation Age of Onset  . Throat cancer Father   . Cancer Mother   . CAD Son   . Breast cancer Sister     cervical cancer  . Colon cancer Brother     throat cancer  . Pulmonary embolism Daughter   . Hypertension Daughter     Social History Social History  Substance Use Topics  . Smoking status: Former Smoker    Quit date: 05/07/1984  . Smokeless tobacco: Never Used  . Alcohol use No    Review of Systems  Constitutional: No chills. Positive fever.  Eyes: No visual changes. ENT: No sore throat. Cardiovascular: Denies chest pain. Respiratory: Denies shortness of breath. Gastrointestinal: No abdominal pain.  No nausea, no vomiting.  No diarrhea.  No constipation. Genitourinary: Negative for dysuria. Musculoskeletal: Negative for back pain. Skin: Negative for rash. Neurological: Negative for headaches, focal weakness or numbness.  10-point ROS otherwise negative.  ____________________________________________   PHYSICAL EXAM:  VITAL SIGNS: ED Triage Vitals  Enc Vitals Group     BP 06/13/16 2200 147/62     Pulse Rate 06/13/16 2200 79     Resp 06/13/16 2200 18     Temp 06/13/16 2146 100.5 F (38.1 C)     Temp Source 06/13/16 2146 Oral     SpO2 06/13/16 2150 97 %     Weight 06/13/16 2146 128 lb (58.1 kg)     Height 06/13/16 2146 5\' 5"  (1.651 m)   Constitutional: Alert and oriented. Well appearing and in no acute distress. Eyes: Conjunctivae are normal. PERRL. EOMI. Head: Atraumatic. Nose: No congestion/rhinnorhea. Mouth/Throat: Mucous membranes are moist.  Oropharynx non-erythematous. Neck: No stridor.  Cardiovascular: Normal rate, regular rhythm. Good peripheral circulation. Grossly normal heart sounds.   Respiratory: Normal respiratory effort.  No retractions. Lungs CTAB. Gastrointestinal: Soft and nontender. No distention.    Musculoskeletal: No lower extremity tenderness nor edema. No gross deformities of extremities. Neurologic:  Normal speech and language. No gross focal neurologic deficits are appreciated.  Skin:  Skin is warm, dry and intact. No rash noted. Psychiatric: Mood and affect are normal. Speech and behavior are normal.  ____________________________________________   LABS (all labs ordered are listed, but only abnormal results are displayed)  Labs Reviewed  BASIC METABOLIC PANEL - Abnormal; Notable for the following:       Result Value   Glucose, Bld 147 (*)    Creatinine, Ser 1.07 (*)    GFR calc non Af Amer 45 (*)    GFR calc Af Amer 52 (*)    All other components within normal limits  CBC WITH DIFFERENTIAL/PLATELET - Abnormal; Notable for the following:    Platelets 134 (*)    All other components within normal limits  URINALYSIS, ROUTINE W REFLEX MICROSCOPIC (NOT AT [facility]) - Abnormal; Notable for the following:    APPearance CLOUDY (*)    Hgb urine dipstick SMALL (*)  Protein, ur 30 (*)    Nitrite POSITIVE (*)    Leukocytes, UA MODERATE (*)    All other components within normal limits  URINE MICROSCOPIC-ADD ON - Abnormal; Notable for the following:    Squamous Epithelial / LPF 0-5 (*)    Bacteria, UA MANY (*)    All other components within normal limits  CULTURE, BLOOD (ROUTINE X 2)  CULTURE, BLOOD (ROUTINE X 2)   ____________________________________________  EKG  Reviewed in MUSE. No STEMI.  ____________________________________________  RADIOLOGY  Dg Chest 2 View  Result Date: 06/13/2016 CLINICAL DATA:  Fever. EXAM: CHEST  2 VIEW COMPARISON:  Radiographs 08/23/2014 FINDINGS: Unchanged mediastinal contours. Heart upper limits normal in size. Tortuosity and atherosclerosis of the thoracic aorta. Implanted monitoring device in the left anterior chest wall. Mild chronic hyperinflation. Pulmonary vasculature is normal. No consolidation, pleural effusion, or pneumothorax.  Limited thoracic spine assessment. Reverse right shoulder arthroplasty. IMPRESSION: 1. Stable mild hyperinflation without acute process. No evidence of pneumonia. 2. Aortic atherosclerosis. Electronically Signed   By: [name] M.D.   On: 06/13/2016 23:19   ____________________________________________   PROCEDURES  Procedure(s) performed:   Procedures  None ____________________________________________   INITIAL IMPRESSION / ASSESSMENT AND PLAN / ED COURSE  Pertinent labs & imaging results that were available during my care of the patient were reviewed by me and considered in my medical decision making (see chart for details).  Patient presents to the ED for evaluation of fever without a clear source on exam. She is awake, alert, and very well appearing. Temp is down-trending after Tylenol en route and is 100.5 F on arrival to the ED. Exam is non-focal and does not provide any clear source of infection. Plan for labs, cultures, CXR, and UA. Will reassess. Patient does endorse some mild rhinorrhea.   Labs resulted with UTI on UA. Normal CXR. No evidence to suggest complicated UTI or pyelonephritis. Plan for treatment with treatment and then wait for culture and sensitivities. Blood cultures sent. Discussed my impression and plan with the patient and family at bedside in detail. They will follow with the PCP in the coming 3-4 days.   At this time, I do not feel there is any life-threatening condition present. I have reviewed and discussed all results (EKG, imaging, lab, urine as appropriate), exam findings with patient. I have reviewed nursing notes and appropriate previous records.  I feel the patient is safe to be discharged home without further emergent workup. Discussed usual and customary return precautions. Patient and family (if present) verbalize understanding and are comfortable with this plan.  Patient will follow-up with their primary care provider. If they do not have a  primary care provider, information for follow-up has been provided to them. All questions have been answered.  ____________________________________________  FINAL CLINICAL IMPRESSION(S) / ED DIAGNOSES  Final diagnoses:  UTI (lower urinary tract infection)  Fever, unspecified fever cause     MEDICATIONS GIVEN DURING THIS VISIT:  Medications  cephALEXin (KEFLEX) capsule 500 mg (500 mg Oral Given 06/14/16 0023)     NEW OUTPATIENT MEDICATIONS STARTED DURING THIS VISIT:  Discharge Medication List as of 06/14/2016 12:16 AM    START taking these medications   Details  cephALEXin (KEFLEX) 500 MG capsule Take 1 capsule (500 mg total) by mouth 2 (two) times daily., Starting Mon 06/14/2016, Until Mon 06/21/2016, Print          Note:  This document was prepared using Dragon voice recognition software and may include unintentional  dictation errors.  Nanda Quinton, MD Emergency Medicine    Margette Fast, MD 06/14/16 1017

## 2016-06-13 NOTE — ED Triage Notes (Addendum)
Brought via EMS from home for elevated temp.  Per family patient had them cut off the [facility] because she was cold earlier today.  Per ems house was around 90 degrees inside.  Patient found to have oral temp of 102.4.  Given tylenol 1000mg  po en route.  No complaints voiced.  States I feel good.  Per daughter patient c/o back pain a few days ago so she would like her to have her urine checked for infection while she is here.

## 2016-06-14 ENCOUNTER — Telehealth: Payer: Self-pay | Admitting: Family Medicine

## 2016-06-14 LAB — BLOOD CULTURE ID PANEL (REFLEXED)
Acinetobacter baumannii: NOT DETECTED
Candida albicans: NOT DETECTED
Candida glabrata: NOT DETECTED
Candida krusei: NOT DETECTED
Candida parapsilosis: NOT DETECTED
Candida tropicalis: NOT DETECTED
Carbapenem resistance: NOT DETECTED
Enterobacter cloacae complex: NOT DETECTED
Enterobacteriaceae species: DETECTED — AB
Enterococcus species: NOT DETECTED
Escherichia coli: DETECTED — AB
Haemophilus influenzae: NOT DETECTED
Klebsiella oxytoca: NOT DETECTED
Klebsiella pneumoniae: NOT DETECTED
Listeria monocytogenes: NOT DETECTED
Methicillin resistance: NOT DETECTED
Neisseria meningitidis: NOT DETECTED
Proteus species: NOT DETECTED
Pseudomonas aeruginosa: NOT DETECTED
Serratia marcescens: NOT DETECTED
Staphylococcus aureus (BCID): NOT DETECTED
Staphylococcus species: NOT DETECTED
Streptococcus agalactiae: NOT DETECTED
Streptococcus pneumoniae: NOT DETECTED
Streptococcus pyogenes: NOT DETECTED
Streptococcus species: NOT DETECTED
Vancomycin resistance: NOT DETECTED

## 2016-06-14 LAB — BASIC METABOLIC PANEL
Anion gap: 7 (ref 5–15)
BUN: 13 mg/dL (ref 6–20)
CO2: 24 mmol/L (ref 22–32)
Calcium: 9.3 mg/dL (ref 8.9–10.3)
Chloride: 107 mmol/L (ref 101–111)
Creatinine, Ser: 1.07 mg/dL — ABNORMAL HIGH (ref 0.44–1.00)
GFR calc Af Amer: 52 mL/min — ABNORMAL LOW (ref >60)
GFR calc non Af Amer: 45 mL/min — ABNORMAL LOW (ref >60)
Glucose, Bld: 147 mg/dL — ABNORMAL HIGH (ref 65–99)
Potassium: 4.1 mmol/L (ref 3.5–5.1)
Sodium: 138 mmol/L (ref 135–145)

## 2016-06-14 MED ORDER — CEPHALEXIN 500 MG PO CAPS: 500.0000 mg | ORAL_CAPSULE | Freq: Two times a day (BID) | ORAL | 0 refills | Status: DC

## 2016-06-14 MED ORDER — CEPHALEXIN 250 MG PO CAPS
500.0000 mg | ORAL_CAPSULE | Freq: Once | ORAL | Status: AC
  Administered 2016-06-14: 500 mg via ORAL
  Filled 2016-06-14: qty 2

## 2016-06-14 NOTE — Discharge Instructions (Signed)

## 2016-06-14 NOTE — Telephone Encounter (Signed)
[name] called, her mother  had vitamin D level done and cardiologist sent it over to you in Epic. [name] would like you to call her about results  Julie Bass

## 2016-06-15 ENCOUNTER — Telehealth (HOSPITAL_BASED_OUTPATIENT_CLINIC_OR_DEPARTMENT_OTHER): Payer: Self-pay | Admitting: Emergency Medicine

## 2016-06-15 ENCOUNTER — Telehealth (HOSPITAL_BASED_OUTPATIENT_CLINIC_OR_DEPARTMENT_OTHER): Payer: Self-pay | Admitting: *Deleted

## 2016-06-15 ENCOUNTER — Other Ambulatory Visit: Payer: Self-pay | Admitting: Cardiovascular Disease

## 2016-06-16 ENCOUNTER — Inpatient Hospital Stay (HOSPITAL_COMMUNITY)
Admission: EM | Admit: 2016-06-16 | Discharge: 2016-06-18 | DRG: 690 | Disposition: A | Discharge status: Home / Self Care | Payer: Medicare Other | Attending: Internal Medicine | Admitting: Internal Medicine

## 2016-06-16 ENCOUNTER — Encounter (HOSPITAL_COMMUNITY): Payer: Self-pay | Admitting: *Deleted

## 2016-06-16 ENCOUNTER — Telehealth (HOSPITAL_BASED_OUTPATIENT_CLINIC_OR_DEPARTMENT_OTHER): Payer: Self-pay | Admitting: Emergency Medicine

## 2016-06-16 DIAGNOSIS — R5381 Other malaise: Secondary | ICD-10-CM | POA: Diagnosis present

## 2016-06-16 DIAGNOSIS — R7881 Bacteremia: Secondary | ICD-10-CM

## 2016-06-16 DIAGNOSIS — I10 Essential (primary) hypertension: Secondary | ICD-10-CM | POA: Diagnosis present

## 2016-06-16 DIAGNOSIS — E785 Hyperlipidemia, unspecified: Secondary | ICD-10-CM | POA: Diagnosis present

## 2016-06-16 DIAGNOSIS — N390 Urinary tract infection, site not specified: Secondary | ICD-10-CM | POA: Diagnosis present

## 2016-06-16 DIAGNOSIS — I739 Peripheral vascular disease, unspecified: Secondary | ICD-10-CM | POA: Diagnosis present

## 2016-06-16 LAB — URINALYSIS, ROUTINE W REFLEX MICROSCOPIC
Bilirubin Urine: NEGATIVE
Glucose, UA: NEGATIVE mg/dL
Hgb urine dipstick: NEGATIVE
Ketones, ur: NEGATIVE mg/dL
Nitrite: NEGATIVE
Protein, ur: NEGATIVE mg/dL
Specific Gravity, Urine: 1.022 (ref 1.005–1.030)
pH: 6.5 (ref 5.0–8.0)

## 2016-06-16 LAB — CBC WITH DIFFERENTIAL/PLATELET
Basophils Absolute: 0 10*3/uL (ref 0.0–0.1)
Basophils Relative: 0 %
Eosinophils Absolute: 0.1 10*3/uL (ref 0.0–0.7)
Eosinophils Relative: 2 %
HCT: 44.8 % (ref 36.0–46.0)
Hemoglobin: 14.5 g/dL (ref 12.0–15.0)
Lymphocytes Relative: 25 %
Lymphs Abs: 1.5 10*3/uL (ref 0.7–4.0)
MCH: 31.1 pg (ref 26.0–34.0)
MCHC: 32.4 g/dL (ref 30.0–36.0)
MCV: 96.1 fL (ref 78.0–100.0)
Monocytes Absolute: 0.7 10*3/uL (ref 0.1–1.0)
Monocytes Relative: 11 %
Neutro Abs: 3.6 10*3/uL (ref 1.7–7.7)
Neutrophils Relative %: 62 %
Platelets: 174 10*3/uL (ref 150–400)
RBC: 4.66 MIL/uL (ref 3.87–5.11)
RDW: 13.2 % (ref 11.5–15.5)
WBC: 5.9 10*3/uL (ref 4.0–10.5)

## 2016-06-16 LAB — CREATININE, SERUM
Creatinine, Ser: 0.87 mg/dL (ref 0.44–1.00)
GFR calc Af Amer: >60 mL/min (ref >60)
GFR calc non Af Amer: 58 mL/min — ABNORMAL LOW (ref >60)

## 2016-06-16 LAB — CBC
HCT: 43.5 % (ref 36.0–46.0)
Hemoglobin: 14 g/dL (ref 12.0–15.0)
MCH: 31.1 pg (ref 26.0–34.0)
MCHC: 32.2 g/dL (ref 30.0–36.0)
MCV: 96.7 fL (ref 78.0–100.0)
Platelets: 166 10*3/uL (ref 150–400)
RBC: 4.5 MIL/uL (ref 3.87–5.11)
RDW: 13.2 % (ref 11.5–15.5)
WBC: 6.1 10*3/uL (ref 4.0–10.5)

## 2016-06-16 LAB — URINE MICROSCOPIC-ADD ON: RBC / HPF: NONE SEEN RBC/hpf (ref 0–5)

## 2016-06-16 LAB — BASIC METABOLIC PANEL
Anion gap: 10 (ref 5–15)
BUN: 12 mg/dL (ref 6–20)
CO2: 23 mmol/L (ref 22–32)
Calcium: 10.3 mg/dL (ref 8.9–10.3)
Chloride: 105 mmol/L (ref 101–111)
Creatinine, Ser: 0.94 mg/dL (ref 0.44–1.00)
GFR calc Af Amer: >60 mL/min (ref >60)
GFR calc non Af Amer: 53 mL/min — ABNORMAL LOW (ref >60)
Glucose, Bld: 149 mg/dL — ABNORMAL HIGH (ref 65–99)
Potassium: 4.2 mmol/L (ref 3.5–5.1)
Sodium: 138 mmol/L (ref 135–145)

## 2016-06-16 LAB — I-STAT CG4 LACTIC ACID, ED: Lactic Acid, Venous: 3.03 mmol/L (ref 0.5–1.9)

## 2016-06-16 LAB — LACTIC ACID, PLASMA: Lactic Acid, Venous: 1.5 mmol/L (ref 0.5–1.9)

## 2016-06-16 MED ORDER — ACETAMINOPHEN 325 MG PO TABS
650.0000 mg | ORAL_TABLET | Freq: Four times a day (QID) | ORAL | Status: DC | PRN
  Administered 2016-06-18: 650 mg via ORAL
  Filled 2016-06-16: qty 2

## 2016-06-16 MED ORDER — EZETIMIBE 10 MG PO TABS
10.0000 mg | ORAL_TABLET | Freq: Every evening | ORAL | Status: DC
  Administered 2016-06-17: 10 mg via ORAL
  Filled 2016-06-16 (×3): qty 1

## 2016-06-16 MED ORDER — DEXTROSE 5 % IV SOLN
2.0000 g | INTRAVENOUS | Status: DC
  Administered 2016-06-17: 2 g via INTRAVENOUS
  Filled 2016-06-16 (×2): qty 2

## 2016-06-16 MED ORDER — CLOPIDOGREL BISULFATE 75 MG PO TABS
75.0000 mg | ORAL_TABLET | Freq: Every day | ORAL | Status: DC
  Administered 2016-06-17 – 2016-06-18 (×2): 75 mg via ORAL
  Filled 2016-06-16 (×2): qty 1

## 2016-06-16 MED ORDER — ONDANSETRON HCL 4 MG PO TABS: 4.0000 mg | ORAL_TABLET | Freq: Four times a day (QID) | ORAL | Status: DC | PRN

## 2016-06-16 MED ORDER — ENSURE COMPLETE PO LIQD: 237.0000 mL | Freq: Three times a day (TID) | ORAL | Status: DC

## 2016-06-16 MED ORDER — SODIUM CHLORIDE 0.9 % IV BOLUS (SEPSIS)
1000.0000 mL | Freq: Once | INTRAVENOUS | Status: AC
  Administered 2016-06-16: 1000 mL via INTRAVENOUS

## 2016-06-16 MED ORDER — DEXTROSE 5 % IV SOLN
2.0000 g | Freq: Once | INTRAVENOUS | Status: AC
  Administered 2016-06-16: 2 g via INTRAVENOUS
  Filled 2016-06-16: qty 2

## 2016-06-16 MED ORDER — TIMOLOL MALEATE 0.5 % OP SOLN
1.0000 [drp] | Freq: Two times a day (BID) | OPHTHALMIC | Status: DC
  Administered 2016-06-16 – 2016-06-18 (×4): 1 [drp] via OPHTHALMIC
  Filled 2016-06-16 (×2): qty 5

## 2016-06-16 MED ORDER — LOSARTAN POTASSIUM 50 MG PO TABS: 100.0000 mg | ORAL_TABLET | Freq: Every day | ORAL | Status: DC

## 2016-06-16 MED ORDER — KRILL OIL 1000 MG PO CAPS: 1000.0000 mg | ORAL_CAPSULE | Freq: Every evening | ORAL | Status: DC

## 2016-06-16 MED ORDER — ENSURE ENLIVE PO LIQD
237.0000 mL | Freq: Three times a day (TID) | ORAL | Status: DC
  Administered 2016-06-17 – 2016-06-18 (×4): 237 mL via ORAL

## 2016-06-16 MED ORDER — ACETAMINOPHEN 650 MG RE SUPP: 650.0000 mg | Freq: Four times a day (QID) | RECTAL | Status: DC | PRN

## 2016-06-16 MED ORDER — CITALOPRAM HYDROBROMIDE 20 MG PO TABS
10.0000 mg | ORAL_TABLET | Freq: Every day | ORAL | Status: DC
  Administered 2016-06-17 – 2016-06-18 (×2): 10 mg via ORAL
  Filled 2016-06-16 (×2): qty 1

## 2016-06-16 MED ORDER — OMEGA-3-ACID ETHYL ESTERS 1 G PO CAPS
1.0000 g | ORAL_CAPSULE | Freq: Every evening | ORAL | Status: DC
  Administered 2016-06-16 – 2016-06-17 (×2): 1 g via ORAL
  Filled 2016-06-16 (×2): qty 1

## 2016-06-16 MED ORDER — ENOXAPARIN SODIUM 40 MG/0.4ML ~~LOC~~ SOLN
40.0000 mg | SUBCUTANEOUS | Status: DC
  Administered 2016-06-16 – 2016-06-17 (×2): 40 mg via SUBCUTANEOUS
  Filled 2016-06-16 (×2): qty 0.4

## 2016-06-16 MED ORDER — LOSARTAN POTASSIUM 50 MG PO TABS
100.0000 mg | ORAL_TABLET | Freq: Every day | ORAL | Status: DC
  Administered 2016-06-17 – 2016-06-18 (×2): 100 mg via ORAL
  Filled 2016-06-16 (×2): qty 2

## 2016-06-16 MED ORDER — ASPIRIN EC 81 MG PO TBEC
81.0000 mg | DELAYED_RELEASE_TABLET | Freq: Every evening | ORAL | Status: DC
  Administered 2016-06-16 – 2016-06-17 (×2): 81 mg via ORAL
  Filled 2016-06-16 (×2): qty 1

## 2016-06-16 MED ORDER — AMLODIPINE BESYLATE 5 MG PO TABS
5.0000 mg | ORAL_TABLET | Freq: Every day | ORAL | Status: DC
  Administered 2016-06-17 – 2016-06-18 (×2): 5 mg via ORAL
  Filled 2016-06-16 (×2): qty 1

## 2016-06-16 MED ORDER — ONDANSETRON HCL 4 MG/2ML IJ SOLN: 4.0000 mg | Freq: Four times a day (QID) | INTRAMUSCULAR | Status: DC | PRN

## 2016-06-16 MED ORDER — PANTOPRAZOLE SODIUM 40 MG PO TBEC
40.0000 mg | DELAYED_RELEASE_TABLET | ORAL | Status: DC
  Administered 2016-06-17 – 2016-06-18 (×2): 40 mg via ORAL
  Filled 2016-06-16 (×3): qty 1

## 2016-06-16 MED ORDER — SODIUM CHLORIDE 0.9 % IV SOLN
INTRAVENOUS | Status: DC
  Administered 2016-06-16 – 2016-06-17 (×2): via INTRAVENOUS

## 2016-06-16 MED ORDER — METOPROLOL TARTRATE 50 MG PO TABS
75.0000 mg | ORAL_TABLET | Freq: Two times a day (BID) | ORAL | Status: DC
  Administered 2016-06-16 – 2016-06-18 (×4): 75 mg via ORAL
  Filled 2016-06-16 (×4): qty 1

## 2016-06-16 NOTE — Progress Notes (Signed)
Dr. Ree Kida texted paged patient has been admitted to [phone]

## 2016-06-16 NOTE — ED Provider Notes (Signed)
[facility] DEPT Provider Note   CSN: [id number] Arrival date & time: 06/16/16  1302  First Provider Contact:  First MD Initiated Contact with Patient 06/16/16 1545        History   Chief Complaint Chief Complaint  Patient presents with  . Abnormal Lab    HPI Julie Bass is a 82 y.o. female presenting to the ER with continued weakness. Patient was seen here 3 days ago with a febrile illness. She was diagnosed with UTI, discharged on Keflex, and sent home. Patient has been having waxing and waning symptoms since. Has chronic back pain but no new back pain. No urinary symptoms. Fevers are gone but patient feels weak today. She was called by the hospital because of a positive blood culture resulting in Escherichia coli. Patient has not had any headaches, cough, shortness of breath, or altered mental status.  HPI  Past Medical History:  Diagnosis Date  . Arthritis   . Chronic kidney disease    KIDNEY STONES  . Dementia    BEGINNING STAGES   . Depression   . GERD (gastroesophageal reflux disease)   . Glaucoma   . Hiatal hernia   . HTN (hypertension) 10/18/2011  . Hyperlipemia 10/18/2011  . NSVT (nonsustained ventricular tachycardia) ([place]) 10/18/2011  . Osteoporosis   . PAD (peripheral artery disease) ([place]) 10/18/2011   h/o left SFA stent    Patient Active Problem List   Diagnosis Date Noted  . Bacteremia 06/16/2016  . UTI (lower urinary tract infection) 06/16/2016  . Warthin's tumor 01/01/2016  . Dementia 03/14/2015  . Orthostatic hypotension 08/27/2014  . Physical deconditioning 08/27/2014  . Abdominal pain in female 08/27/2014  . Osteoarthrosis, unspecified whether generalized or localized, shoulder region 01/26/2013  . Chest wall pain 12/26/2012  . Rib contusion 12/26/2012  . NSVT (nonsustained ventricular tachycardia) ([place]) 10/18/2011  . HTN (hypertension) 10/18/2011  . Hyperlipemia 10/18/2011  . PAD (peripheral artery disease) ([place]) 10/18/2011  . Syncope  09/30/2011  . Cough 09/30/2011  . Fatigue 09/30/2011  . Fall 09/30/2011  . Pelvic joint pain 09/30/2011  . Back pain 09/30/2011  . Need for prophylactic vaccination and inoculation against influenza 09/30/2011    Past Surgical History:  Procedure Laterality Date  . ABDOMINAL HYSTERECTOMY  1973  . CARDIAC CATHETERIZATION     2012  . CHOLECYSTECTOMY    . LOOP RECORDER IMPLANT N/A 08/27/2014   Procedure: LOOP RECORDER IMPLANT;  Surgeon: Coralyn Mark, MD;  Location: [facility] CATH LAB;  Service: Cardiovascular;  Laterality: N/A;  . LOWER EXTREMITY ANGIOGRAM  11/02/2007   stent of mid left SFA with 6x166mm EV3 self-expanding stent and 6x3 in prox region (Dr. Adora Fridge)  . LOWER EXTREMITY ANGIOGRAM    . LOWER EXTREMITY ARTERIAL DOPPLER  2013   right SFA with 50-69% diameter reduction, right PTA/peroneal occluded, L SFA prox to stent has narrowing with increased velocities >60% diameter reduction, L SFA stent patent, L ATA w/occlusive disease, bilat ABIs show mild arterial insuffiency at rest  . NM MYOCAR PERF WALL MOTION  10/2011   non-gated - normal stuy, EF 82%, normal LV wall motion  . REVERSE SHOULDER ARTHROPLASTY Right 01/26/2013   Procedure: RIGHT REVERSE TOTAL SHOULDER ARTHROPLASTY;  Surgeon: Augustin Schooling, MD;  Location: [facility];  Service: Orthopedics;  Laterality: Right;  . TRANSTHORACIC ECHOCARDIOGRAM  10/2012   EF 75-70%, mod conc hypertrophy, severely calcified MV annulus, LA mildly dailted, PA peak pressure 29mmHg    OB History    No  data available       Home Medications    Prior to Admission medications   Medication Sig Start Date End Date Taking? Authorizing Provider  amLODipine (NORVASC) 5 MG tablet TAKE 1 TABLET (5 MG TOTAL) BY MOUTH DAILY. Patient taking differently: TAKE 1 TABLET (5 MG TOTAL) BY MOUTH every evening 06/03/16   Troy Sine, MD  aspirin EC 81 MG tablet Take 81 mg by mouth every evening.     Historical Provider, MD  cephALEXin (KEFLEX) 500 MG capsule  Take 1 capsule (500 mg total) by mouth 2 (two) times daily. 06/14/16 06/21/16  Margette Fast, MD  citalopram (CELEXA) 10 MG tablet TAKE 1 TABLET (10 MG TOTAL) BY MOUTH DAILY. 06/16/16   Troy Sine, MD  clopidogrel (PLAVIX) 75 MG tablet Take 1 tablet (75 mg total) by mouth daily. 04/09/16   Troy Sine, MD  ezetimibe (ZETIA) 10 MG tablet Take 1 tablet (10 mg total) by mouth daily. Patient taking differently: Take 10 mg by mouth every evening.  06/12/15   Troy Sine, MD  feeding supplement, ENSURE COMPLETE, (ENSURE COMPLETE) LIQD Take 237 mLs by mouth 3 (three) times daily between meals. 08/27/14   Evelene Croon Barrett, PA-C  Krill Oil 1000 MG CAPS Take 1,000 mg by mouth every evening.     Historical Provider, MD  losartan (COZAAR) 100 MG tablet Take 1 tablet (100 mg total) by mouth daily. 04/09/16   Troy Sine, MD  metoprolol (LOPRESSOR) 50 MG tablet Take 1.5 tablets (75 mg total) by mouth 2 (two) times daily. Take 75 mg twice daily 02/09/16   Troy Sine, MD  Multiple Vitamins-Minerals (SENIOR MULTIVITAMIN PLUS PO) Take 1 tablet by mouth daily.    Historical Provider, MD  pantoprazole (PROTONIX) 40 MG tablet Take 1 tablet (40 mg total) by mouth 2 (two) times daily. Patient taking differently: Take 40 mg by mouth every morning.  02/09/16   Troy Sine, MD  Pitavastatin Calcium (LIVALO) 2 MG TABS Take 1 tablet (2 mg total) by mouth daily. Patient taking differently: Take 1 tablet by mouth every evening.  05/14/16   Troy Sine, MD  timolol (TIMOPTIC) 0.5 % ophthalmic solution Place 1 drop into both eyes 2 (two) times daily.     Historical Provider, MD    Family History Family History  Problem Relation Age of Onset  . Throat cancer Father   . Cancer Mother   . CAD Son   . Breast cancer Sister     cervical cancer  . Colon cancer Brother     throat cancer  . Pulmonary embolism Daughter   . Hypertension Daughter     Social History Social History  Substance Use Topics  . Smoking  status: Former Smoker    Quit date: 05/07/1984  . Smokeless tobacco: Never Used  . Alcohol use No     Allergies   Crestor [rosuvastatin calcium] and Lipitor [atorvastatin calcium]   Review of Systems Review of Systems  Constitutional: Positive for fatigue. Negative for fever.  Respiratory: Negative for shortness of breath.   Cardiovascular: Negative for chest pain.  Gastrointestinal: Negative for abdominal pain and vomiting.  Genitourinary: Negative for dysuria, flank pain and hematuria.  Musculoskeletal: Positive for back pain.  Neurological: Positive for weakness.  All other systems reviewed and are negative.    Physical Exam Updated Vital Signs BP 167/55   Pulse 63   Temp 98.5 F (36.9 C) (Rectal)   Resp 11  SpO2 99%   Physical Exam  Constitutional: She is oriented to person, place, and time. She appears well-developed and well-nourished. No distress.  HENT:  Head: Normocephalic and atraumatic.  Right Ear: External ear normal.  Left Ear: External ear normal.  Nose: Nose normal.  Mouth/Throat: Oropharynx is clear and moist.  Eyes: Right eye exhibits no discharge. Left eye exhibits no discharge.  Cardiovascular: Normal rate, regular rhythm and normal heart sounds.   Pulmonary/Chest: Effort normal and breath sounds normal.  Abdominal: Soft. She exhibits no distension. There is no tenderness. There is no CVA tenderness.  Neurological: She is alert and oriented to person, place, and time.  Skin: Skin is warm and dry. She is not diaphoretic.  Nursing note and vitals reviewed.    ED Treatments / Results  Labs (all labs ordered are listed, but only abnormal results are displayed) Labs Reviewed  BASIC METABOLIC PANEL - Abnormal; Notable for the following:       Result Value   Glucose, Bld 149 (*)    GFR calc non Af Amer 53 (*)    All other components within normal limits  URINALYSIS, ROUTINE W REFLEX MICROSCOPIC (NOT AT [facility]) - Abnormal; Notable for the  following:    Leukocytes, UA TRACE (*)    All other components within normal limits  URINE MICROSCOPIC-ADD ON - Abnormal; Notable for the following:    Squamous Epithelial / LPF 0-5 (*)    Bacteria, UA FEW (*)    All other components within normal limits  I-STAT CG4 LACTIC ACID, ED - Abnormal; Notable for the following:    Lactic Acid, Venous 3.03 (*)    All other components within normal limits  CULTURE, BLOOD (SINGLE)  URINE CULTURE  CULTURE, BLOOD (SINGLE)  CBC WITH DIFFERENTIAL/PLATELET  I-STAT CG4 LACTIC ACID, ED    EKG  EKG Interpretation  Date/Time:  Wednesday June 16 2016 15:38:42 EDT Ventricular Rate:  61 PR Interval:    QRS Duration: 90 QT Interval:  438 QTC Calculation: 442 R Axis:   16 Text Interpretation:  Sinus rhythm Ventricular premature complex Prolonged PR interval Consider left ventricular hypertrophy no significant change since June 13 2016 Confirmed by Regenia Skeeter MD, Kennethia Lynes [phone]) on 06/16/2016 4:37:21 PM       Radiology No results found.  Procedures Procedures (including critical care time)  Medications Ordered in ED Medications  cefTRIAXone (ROCEPHIN) 2 g in dextrose 5 % 50 mL IVPB (not administered)  sodium chloride 0.9 % bolus 1,000 mL (1,000 mLs Intravenous New Bag/Given 06/16/16 1619)    And  sodium chloride 0.9 % bolus 1,000 mL (1,000 mLs Intravenous New Bag/Given 06/16/16 1608)  cefTRIAXone (ROCEPHIN) 2 g in dextrose 5 % 50 mL IVPB (0 g Intravenous Stopped 06/16/16 1638)     Initial Impression / Assessment and Plan / ED Course  I have reviewed the triage vital signs and the nursing notes.  Pertinent labs & imaging results that were available during my care of the patient were reviewed by me and considered in my medical decision making (see chart for details).  Clinical Course  Comment By Time  Will call code sepsis based on lactic of 3 although all other findings on exam and labs are unremarkable. Treat with rocephin. Will admit to  hospitalist. Sherwood Gambler, MD 08/02 1553  Dr. Ree Kida will admit to obs. Sherwood Gambler, MD 08/02 1639    Final Clinical Impressions(s) / ED Diagnoses   Final diagnoses:  Bacteremia    New Prescriptions New  Prescriptions   No medications on file     Sherwood Gambler, MD 06/16/16 [phone]

## 2016-06-16 NOTE — ED Notes (Signed)
Pt changing into a gown

## 2016-06-16 NOTE — Progress Notes (Signed)
Julie Bass is a 82 y.o. female patient admitted from ED awake, alert - oriented  X 4 - no acute distress noted.  VSS - Blood pressure (!) 162/87, pulse 72, temperature 98.8 F (37.1 C), temperature source Oral, resp. rate 16, SpO2 100 %.    IV in place, occlusive dsg intact without redness.  Orientation to room, and floor completed with information packet given to patient/family.  Admission INP armband ID verified with patient/family, and in place.   SR up x 2, fall assessment complete, with patient and family able to verbalize understanding of risk associated with falls, and verbalized understanding to call nsg before up out of bed.  Call light within reach, patient able to voice, and demonstrate understanding.  Skin, clean-dry- intact without evidence of bruising, or skin tears.   No evidence of skin break down noted on exam.     Will cont to eval and treat per MD orders.  Theora Master, RN 06/16/2016 6:31 PM

## 2016-06-16 NOTE — Telephone Encounter (Signed)
Post ED Visit - Positive Culture Follow-up: Successful Patient Follow-Up  Culture assessed and recommendations reviewed by: []  Elenor Quinones, Pharm.D. []  Heide Guile, Pharm.D., BCPS []  Parks Neptune, Pharm.D. []  Alycia Rossetti, Pharm.D., BCPS []  [place], Pharm.D., BCPS, AAHIVP []  Legrand Como, Pharm.D., BCPS, AAHIVP []  Milus Glazier, Pharm.D. []  Rob Evette Doffing, Pharm.D.  Positive blood culture gram negative rods E. Coli  []  Patient discharged without antimicrobial prescription and treatment is now indicated []  Organism is resistant to prescribed ED discharge antimicrobial [x]  Patient with positive blood cultures  Changes discussed with ED provider: Dr. Laverta Baltimore  Daughter advised that patient needs to be brought back to the ED for reeval/possible admission for + Blood cultures gram negative rods E. Coli  Hazle Nordmann 06/16/2016, 10:07 AM

## 2016-06-16 NOTE — Progress Notes (Signed)
Pharmacy Antibiotic Note Julie Bass is a 82 y.o. female admitted on 06/16/2016 with bacteremia.  Pharmacy has been consulted for Ceftriaxone dosing.  Plan: Begin Ceftriaxone 2 grams IV every 24 hours     Temp (24hrs), Avg:98.1 F (36.7 C), Min:97.9 F (36.6 C), Max:98.3 F (36.8 C)   Recent Labs Lab 06/13/16 2225 06/16/16 1355 06/16/16 1356 06/16/16 1405  WBC 9.3  --  5.9  --   CREATININE 1.07* 0.94  --   --   LATICACIDVEN  --   --   --  3.03*    Estimated Creatinine Clearance: 37.2 mL/min (by C-G formula based on SCr of 0.94 mg/dL).    Allergies  Allergen Reactions  . Crestor [Rosuvastatin Calcium]     Muscle pain  . Lipitor [Atorvastatin Calcium]     Muscle pain    Antimicrobials this admission: 8/2 Ceftriaxone >>   Dose adjustments this admission: n/a  Microbiology results: 7/30 BCx: E.coli per BCID 8/2 BCx: px   Thank you for allowing pharmacy to be a part of this patient's care.  Vincenza Hews, PharmD, BCPS 06/16/2016, 3:58 PM Pager: [phone]

## 2016-06-16 NOTE — ED Notes (Signed)
Attempted report to 6N 

## 2016-06-16 NOTE — H&P (Signed)
[facility] History and Physical  Julie Bass [id number] DOB: 1926-05-09 DOA: 06/16/2016  PCP: [name], MD  Patient coming from: Home  Chief Complaint: Positive blood culture  HPI: Julie Bass is a 82 y.o. female with a medical history of peripheral vascular disease, hypertension, hyperlipidemia, presented to the emergency department for bacteremia. Patient had recently come to the emergency department on 06/13/2016 for complaints of fever and altered mental status. Patient was found to have a urinary tract infection and discharged with Keflex. Blood cultures were taken at that time. Patient was on call today and told to return to the emergency department for bacteremia. Blood cultures from 06/13/2016 did show 1/2 blood cultures positive for Escherichia coli. Sensitivities are still pending. At this time patient does feel weak and has had poor appetite. Patient denies any further fevers. Although does continue to complain of generalized weakness fatigue and poor appetite. Denies any current chest pain, shortness of breath, dizziness, headache, abdominal pain, nausea or vomiting, diarrhea or constipation.  ED Course: Found to have an elevated lactic acid of 3.03, started on ceftriaxone for Escherichia coli bacteremia and UTI.  Review of Systems:  All other systems reviewed and are negative.   Past Medical History:  Diagnosis Date  . Arthritis   . Chronic kidney disease    KIDNEY STONES  . Dementia    BEGINNING STAGES   . Depression   . GERD (gastroesophageal reflux disease)   . Glaucoma   . Hiatal hernia   . HTN (hypertension) 10/18/2011  . Hyperlipemia 10/18/2011  . NSVT (nonsustained ventricular tachycardia) ([place]) 10/18/2011  . Osteoporosis   . PAD (peripheral artery disease) ([place]) 10/18/2011   h/o left SFA stent    Past Surgical History:  Procedure Laterality Date  . ABDOMINAL HYSTERECTOMY  1973  . CARDIAC CATHETERIZATION     2012  .  CHOLECYSTECTOMY    . LOOP RECORDER IMPLANT N/A 08/27/2014   Procedure: LOOP RECORDER IMPLANT;  Surgeon: Coralyn Mark, MD;  Location: [place] [facility];  Service: Cardiovascular;  Laterality: N/A;  . LOWER EXTREMITY ANGIOGRAM  11/02/2007   stent of mid left SFA with 6x118mm EV3 self-expanding stent and 6x3 in prox region (Dr. Adora Fridge)  . LOWER EXTREMITY ANGIOGRAM    . LOWER EXTREMITY ARTERIAL DOPPLER  2013   right SFA with 50-69% diameter reduction, right PTA/peroneal occluded, L SFA prox to stent has narrowing with increased velocities >60% diameter reduction, L SFA stent patent, L ATA w/occlusive disease, bilat ABIs show mild arterial insuffiency at rest  . NM MYOCAR PERF WALL MOTION  10/2011   non-gated - normal stuy, EF 82%, normal LV wall motion  . REVERSE SHOULDER ARTHROPLASTY Right 01/26/2013   Procedure: RIGHT REVERSE TOTAL SHOULDER ARTHROPLASTY;  Surgeon: Augustin Schooling, MD;  Location: [place];  Service: Orthopedics;  Laterality: Right;  . TRANSTHORACIC ECHOCARDIOGRAM  10/2012   EF 75-70%, mod conc hypertrophy, severely calcified MV annulus, LA mildly dailted, PA peak pressure 81mmHg    Social History:  reports that she quit smoking about 32 years ago. She has never used smokeless tobacco. She reports that she does not drink alcohol or use drugs.   Allergies  Allergen Reactions  . Crestor [Rosuvastatin Calcium]     Muscle pain  . Lipitor [Atorvastatin Calcium]     Muscle pain    Family History  Problem Relation Age of Onset  . Throat cancer Father   . Cancer Mother   . CAD Son   .  Breast cancer Sister     cervical cancer  . Colon cancer Brother     throat cancer  . Pulmonary embolism Daughter   . Hypertension Daughter    Prior to Admission medications   Medication Sig Start Date End Date Taking? Authorizing Provider  amLODipine (NORVASC) 5 MG tablet TAKE 1 TABLET (5 MG TOTAL) BY MOUTH DAILY. Patient taking differently: TAKE 1 TABLET (5 MG TOTAL) BY MOUTH every evening  06/03/16   Troy Sine, MD  aspirin EC 81 MG tablet Take 81 mg by mouth every evening.     Historical Provider, MD  cephALEXin (KEFLEX) 500 MG capsule Take 1 capsule (500 mg total) by mouth 2 (two) times daily. 06/14/16 06/21/16  Margette Fast, MD  citalopram (CELEXA) 10 MG tablet TAKE 1 TABLET (10 MG TOTAL) BY MOUTH DAILY. 06/16/16   Troy Sine, MD  clopidogrel (PLAVIX) 75 MG tablet Take 1 tablet (75 mg total) by mouth daily. 04/09/16   Troy Sine, MD  ezetimibe (ZETIA) 10 MG tablet Take 1 tablet (10 mg total) by mouth daily. Patient taking differently: Take 10 mg by mouth every evening.  06/12/15   Troy Sine, MD  feeding supplement, ENSURE COMPLETE, (ENSURE COMPLETE) LIQD Take 237 mLs by mouth 3 (three) times daily between meals. 08/27/14   Evelene Croon Barrett, PA-C  Krill Oil 1000 MG CAPS Take 1,000 mg by mouth every evening.     Historical Provider, MD  losartan (COZAAR) 100 MG tablet Take 1 tablet (100 mg total) by mouth daily. 04/09/16   Troy Sine, MD  metoprolol (LOPRESSOR) 50 MG tablet Take 1.5 tablets (75 mg total) by mouth 2 (two) times daily. Take 75 mg twice daily 02/09/16   Troy Sine, MD  Multiple Vitamins-Minerals (SENIOR MULTIVITAMIN PLUS PO) Take 1 tablet by mouth daily.    Historical Provider, MD  pantoprazole (PROTONIX) 40 MG tablet Take 1 tablet (40 mg total) by mouth 2 (two) times daily. Patient taking differently: Take 40 mg by mouth every morning.  02/09/16   Troy Sine, MD  Pitavastatin Calcium (LIVALO) 2 MG TABS Take 1 tablet (2 mg total) by mouth daily. Patient taking differently: Take 1 tablet by mouth every evening.  05/14/16   Troy Sine, MD  timolol (TIMOPTIC) 0.5 % ophthalmic solution Place 1 drop into both eyes 2 (two) times daily.     Historical Provider, MD    Physical Exam: Vitals:   06/16/16 1630 06/16/16 1645  BP: 152/59 167/55  Pulse: 61 63  Resp: 10 11  Temp:       General: Well developed, well nourished, NAD, appears stated  age  HEENT: NCAT, PERRLA, EOMI, Anicteic Sclera, mucous membranes mildly dry.   Neck: Supple, no JVD, no masses  Cardiovascular: S1 S2 auscultated, 2/6SEM, Regular rate and rhythm.  Respiratory: Clear to auscultation bilaterally with equal chest rise  Abdomen: Soft, mild suprapubic TTP, nondistended, + bowel sounds  Extremities: warm dry without cyanosis clubbing or edema  Neuro: AAOx3, cranial nerves grossly intact. Strength equal and bilateral in upper/lower ext  Skin: Without rashes exudates or nodules  Psych: Normal affect and demeanor with intact judgement and insight  Labs on Admission: I have personally reviewed following labs and imaging studies CBC:  Recent Labs Lab 06/13/16 2225 06/16/16 1356  WBC 9.3 5.9  NEUTROABS 7.4 3.6  HGB 12.8 14.5  HCT 40.6 44.8  MCV 96.9 96.1  PLT 134* [id number]   Basic Metabolic Panel:  Recent Labs Lab 06/13/16 2225 06/16/16 1355  NA 138 138  K 4.1 4.2  CL 107 105  CO2 24 23  GLUCOSE 147* 149*  BUN 13 12  CREATININE 1.07* 0.94  CALCIUM 9.3 10.3   GFR: Estimated Creatinine Clearance: 37.2 mL/min (by C-G formula based on SCr of 0.94 mg/dL). Liver Function Tests: No results for input(s): AST, ALT, ALKPHOS, BILITOT, PROT, ALBUMIN in the last 168 hours. No results for input(s): LIPASE, AMYLASE in the last 168 hours. No results for input(s): AMMONIA in the last 168 hours. Coagulation Profile: No results for input(s): INR, PROTIME in the last 168 hours. Cardiac Enzymes: No results for input(s): CKTOTAL, CKMB, CKMBINDEX, TROPONINI in the last 168 hours. BNP (last 3 results) No results for input(s): PROBNP in the last 8760 hours. HbA1C: No results for input(s): HGBA1C in the last 72 hours. CBG: No results for input(s): GLUCAP in the last 168 hours. Lipid Profile: No results for input(s): CHOL, HDL, LDLCALC, TRIG, CHOLHDL, LDLDIRECT in the last 72 hours. Thyroid Function Tests: No results for input(s): TSH, T4TOTAL, FREET4,  T3FREE, THYROIDAB in the last 72 hours. Anemia Panel: No results for input(s): VITAMINB12, FOLATE, FERRITIN, TIBC, IRON, RETICCTPCT in the last 72 hours. Urine analysis:    Component Value Date/Time   COLORURINE YELLOW 06/16/2016 [place] 06/16/2016 1612   LABSPEC 1.022 06/16/2016 1612   PHURINE 6.5 06/16/2016 1612   GLUCOSEU NEGATIVE 06/16/2016 1612   HGBUR NEGATIVE 06/16/2016 1612   BILIRUBINUR NEGATIVE 06/16/2016 1612   BILIRUBINUR NEG 09/29/2011 1622   KETONESUR NEGATIVE 06/16/2016 1612   PROTEINUR NEGATIVE 06/16/2016 1612   UROBILINOGEN 1 10/08/2014 1529   NITRITE NEGATIVE 06/16/2016 1612   LEUKOCYTESUR TRACE (A) 06/16/2016 1612   Sepsis Labs: @LABRCNTIP (procalcitonin:4,lacticidven:4) ) Recent Results (from the past 240 hour(s))  Culture, blood (routine x 2)     Status: None (Preliminary result)   Collection Time: 06/13/16 10:20 PM  Result Value Ref Range Status   Specimen Description BLOOD RIGHT ARM  Final   Special Requests IN PEDIATRIC BOTTLE 3CC  Final   Culture NO GROWTH 2 DAYS  Final   Report Status PENDING  Incomplete  Culture, blood (routine x 2)     Status: Abnormal (Preliminary result)   Collection Time: 06/13/16 10:25 PM  Result Value Ref Range Status   Specimen Description BLOOD RIGHT ANTECUBITAL  Final   Special Requests BOTTLES DRAWN AEROBIC AND ANAEROBIC 5CC  Final   Culture  Setup Time   Final    GRAM NEGATIVE RODS IN BOTH AEROBIC AND ANAEROBIC BOTTLES K. NEWMAN, FLOW MANAGER, AT [place] ON [id number] BY S. YARBROUGH    Culture ESCHERICHIA COLI SUSCEPTIBILITIES TO FOLLOW  (A)  Final   Report Status PENDING  Incomplete  Blood Culture ID Panel (Reflexed)     Status: Abnormal   Collection Time: 06/13/16 10:25 PM  Result Value Ref Range Status   Enterococcus species NOT DETECTED NOT DETECTED Final   Vancomycin resistance NOT DETECTED NOT DETECTED Final   Listeria monocytogenes NOT DETECTED NOT DETECTED Final   Staphylococcus species NOT  DETECTED NOT DETECTED Final   Staphylococcus aureus NOT DETECTED NOT DETECTED Final   Methicillin resistance NOT DETECTED NOT DETECTED Final   Streptococcus species NOT DETECTED NOT DETECTED Final   Streptococcus agalactiae NOT DETECTED NOT DETECTED Final   Streptococcus pneumoniae NOT DETECTED NOT DETECTED Final   Streptococcus pyogenes NOT DETECTED NOT DETECTED Final   Acinetobacter baumannii NOT DETECTED NOT DETECTED Final  Enterobacteriaceae species DETECTED (A) NOT DETECTED Final    Comment: CRITICAL RESULT CALLED TO, READ BACK BY AND VERIFIED WITH: K. NEWMAN, FLOW MANAGER AT 1821 ON [id number] BY S. YARBROUGH    Enterobacter cloacae complex NOT DETECTED NOT DETECTED Final   Escherichia coli DETECTED (A) NOT DETECTED Final    Comment: CRITICAL RESULT CALLED TO, READ BACK BY AND VERIFIED WITH: K. NEWMAN, FLOW MANAGER AT 1821 ON [id number] BY S. YARBROUGH    Klebsiella oxytoca NOT DETECTED NOT DETECTED Final   Klebsiella pneumoniae NOT DETECTED NOT DETECTED Final   Proteus species NOT DETECTED NOT DETECTED Final   Serratia marcescens NOT DETECTED NOT DETECTED Final   Carbapenem resistance NOT DETECTED NOT DETECTED Final   Haemophilus influenzae NOT DETECTED NOT DETECTED Final   Neisseria meningitidis NOT DETECTED NOT DETECTED Final   Pseudomonas aeruginosa NOT DETECTED NOT DETECTED Final   Candida albicans NOT DETECTED NOT DETECTED Final   Candida glabrata NOT DETECTED NOT DETECTED Final   Candida krusei NOT DETECTED NOT DETECTED Final   Candida parapsilosis NOT DETECTED NOT DETECTED Final   Candida tropicalis NOT DETECTED NOT DETECTED Final     Radiological Exams on Admission: No results found.  EKG: Independently reviewed. Sinus rhythm, rate 61, PVC  Assessment/Plan Ecoli UTI/Bacteremia -Patient presented to the emergency department a couple days ago which point urine analysis as well as blood cultures were taken. 1/2 Lactic acidosis blood cultures from 06/13/2016 was positive  for Escherichia coli, pending sensitivities. -Repeat blood cultures pending -Patient was discharged with Keflex -Placed on ceftriaxone -Patient will likely need be discharged on Levaquin as well as oral cephalosporins are inadequate treatment for bacteremia  Lactic acidosis -Also secondary to patient's infection versus poor oral intake -Patient currently is not septic -Will place on gentle IV fluids and monitor lactic acid and 3 hours  Generalized weakness -Likely secondary to UTI and bacteremia -Will obtain TSH level -Continue the IV fluids  Essential Hypertension -Continue her medications, Cozaar, metoprolol, amlodipine  Hyperlipidemia -Patient takes Pitavastatin (will hold for now as patient has allergies to other statins) -Continue krill oil  GERD -Continue PPI  Peripheral vascular disease -Patient follows with cardiology, continue Plavix and aspirin  Depression -Continue Celexa  DVT prophylaxis:  Lovenox  Code Status: Full   Family Communication: Family at bedside  Admission, patients condition and plan of care including tests being ordered have been discussed with the patient and family who indicate understanding and agree with the plan and Code Status.  Disposition Plan: Admitted to medical floor   Consults called: None   Admission status: Observation   Time spent: 60 minutes   Brianni Manthe D.O. [facility] Pager [phone]  If 7PM-7AM, please contact night-coverage [email or web address] Password [email or web address] 06/16/2016, 5:00 PM

## 2016-06-16 NOTE — Telephone Encounter (Signed)
Rx(s) sent to pharmacy electronically.  

## 2016-06-16 NOTE — ED Triage Notes (Signed)
Pt was seen here on Sunday for fever and lethargy, pt was diagnosed with UTI and started on antibiotics. Pt reports feeling ok, still fatigued. Was called today and told to come back here due to +blood cultures.

## 2016-06-17 DIAGNOSIS — I10 Essential (primary) hypertension: Secondary | ICD-10-CM

## 2016-06-17 DIAGNOSIS — R7881 Bacteremia: Secondary | ICD-10-CM | POA: Diagnosis not present

## 2016-06-17 DIAGNOSIS — Z96611 Presence of right artificial shoulder joint: Secondary | ICD-10-CM | POA: Diagnosis present

## 2016-06-17 DIAGNOSIS — I129 Hypertensive chronic kidney disease with stage 1 through stage 4 chronic kidney disease, or unspecified chronic kidney disease: Secondary | ICD-10-CM | POA: Diagnosis present

## 2016-06-17 DIAGNOSIS — Z888 Allergy status to other drugs, medicaments and biological substances status: Secondary | ICD-10-CM | POA: Diagnosis not present

## 2016-06-17 DIAGNOSIS — B9620 Unspecified Escherichia coli [E. coli] as the cause of diseases classified elsewhere: Secondary | ICD-10-CM | POA: Diagnosis present

## 2016-06-17 DIAGNOSIS — Z8249 Family history of ischemic heart disease and other diseases of the circulatory system: Secondary | ICD-10-CM | POA: Diagnosis not present

## 2016-06-17 DIAGNOSIS — K219 Gastro-esophageal reflux disease without esophagitis: Secondary | ICD-10-CM | POA: Diagnosis present

## 2016-06-17 DIAGNOSIS — Z87442 Personal history of urinary calculi: Secondary | ICD-10-CM | POA: Diagnosis not present

## 2016-06-17 DIAGNOSIS — Z803 Family history of malignant neoplasm of breast: Secondary | ICD-10-CM | POA: Diagnosis not present

## 2016-06-17 DIAGNOSIS — Z87891 Personal history of nicotine dependence: Secondary | ICD-10-CM | POA: Diagnosis not present

## 2016-06-17 DIAGNOSIS — N390 Urinary tract infection, site not specified: Principal | ICD-10-CM

## 2016-06-17 DIAGNOSIS — F0390 Unspecified dementia without behavioral disturbance: Secondary | ICD-10-CM | POA: Diagnosis present

## 2016-06-17 DIAGNOSIS — E872 Acidosis: Secondary | ICD-10-CM | POA: Diagnosis present

## 2016-06-17 DIAGNOSIS — N189 Chronic kidney disease, unspecified: Secondary | ICD-10-CM | POA: Diagnosis present

## 2016-06-17 DIAGNOSIS — Z8049 Family history of malignant neoplasm of other genital organs: Secondary | ICD-10-CM | POA: Diagnosis not present

## 2016-06-17 DIAGNOSIS — Z800 Family history of malignant neoplasm of digestive organs: Secondary | ICD-10-CM | POA: Diagnosis not present

## 2016-06-17 DIAGNOSIS — Z808 Family history of malignant neoplasm of other organs or systems: Secondary | ICD-10-CM | POA: Diagnosis not present

## 2016-06-17 DIAGNOSIS — I739 Peripheral vascular disease, unspecified: Secondary | ICD-10-CM | POA: Diagnosis present

## 2016-06-17 DIAGNOSIS — Z90710 Acquired absence of both cervix and uterus: Secondary | ICD-10-CM | POA: Diagnosis not present

## 2016-06-17 DIAGNOSIS — F329 Major depressive disorder, single episode, unspecified: Secondary | ICD-10-CM | POA: Diagnosis present

## 2016-06-17 DIAGNOSIS — G8929 Other chronic pain: Secondary | ICD-10-CM | POA: Diagnosis present

## 2016-06-17 DIAGNOSIS — R55 Syncope and collapse: Secondary | ICD-10-CM | POA: Diagnosis not present

## 2016-06-17 DIAGNOSIS — H409 Unspecified glaucoma: Secondary | ICD-10-CM | POA: Diagnosis present

## 2016-06-17 DIAGNOSIS — M810 Age-related osteoporosis without current pathological fracture: Secondary | ICD-10-CM | POA: Diagnosis present

## 2016-06-17 DIAGNOSIS — E785 Hyperlipidemia, unspecified: Secondary | ICD-10-CM | POA: Diagnosis present

## 2016-06-17 DIAGNOSIS — M549 Dorsalgia, unspecified: Secondary | ICD-10-CM | POA: Diagnosis present

## 2016-06-17 LAB — CBC
HCT: 41 % (ref 36.0–46.0)
Hemoglobin: 13.3 g/dL (ref 12.0–15.0)
MCH: 31.1 pg (ref 26.0–34.0)
MCHC: 32.4 g/dL (ref 30.0–36.0)
MCV: 96 fL (ref 78.0–100.0)
Platelets: 183 10*3/uL (ref 150–400)
RBC: 4.27 MIL/uL (ref 3.87–5.11)
RDW: 13.1 % (ref 11.5–15.5)
WBC: 6.8 10*3/uL (ref 4.0–10.5)

## 2016-06-17 LAB — BASIC METABOLIC PANEL
Anion gap: 4 — ABNORMAL LOW (ref 5–15)
BUN: 11 mg/dL (ref 6–20)
CO2: 24 mmol/L (ref 22–32)
Calcium: 9.4 mg/dL (ref 8.9–10.3)
Chloride: 110 mmol/L (ref 101–111)
Creatinine, Ser: 0.94 mg/dL (ref 0.44–1.00)
GFR calc Af Amer: >60 mL/min (ref >60)
GFR calc non Af Amer: 53 mL/min — ABNORMAL LOW (ref >60)
Glucose, Bld: 100 mg/dL — ABNORMAL HIGH (ref 65–99)
Potassium: 4.7 mmol/L (ref 3.5–5.1)
Sodium: 138 mmol/L (ref 135–145)

## 2016-06-17 LAB — LACTIC ACID, PLASMA: Lactic Acid, Venous: 1.3 mmol/L (ref 0.5–1.9)

## 2016-06-17 LAB — CULTURE, BLOOD (ROUTINE X 2)

## 2016-06-17 LAB — URINE CULTURE: Culture: NO GROWTH

## 2016-06-17 NOTE — Progress Notes (Signed)
PROGRESS NOTE    Julie Bass  [id number] DOB: [birth year] DOA: 06/16/2016 PCP: [name], MD    Brief Narrative: Julie Bass is a 82 y.o. female with a medical history of peripheral vascular disease, hypertension, hyperlipidemia, presented to the emergency department for bacteremia.   Assessment & Plan:   Active Problems:   HTN (hypertension)   Hyperlipemia   PAD (peripheral artery disease) ([facility])   Physical deconditioning   Bacteremia   UTI (lower urinary tract infection)   E coli bacteremia: Started on IV rocephin. Blood cultures show e coli sensitive to rocephin.  Repeat blood cultures done and pending.    Lactic acidosis: resolved with fluid resuscitation.   Generalized weakness probably from the above.  Get PT eval.   Hypertension;  Well controlled.   PVD: ON aspirin and plavix.      DVT prophylaxis: (Lovenox/) Code Status: (Full/) Family Communication: discussed with daughter over the phone.  Disposition Plan:pending blood cultures.    Consultants:  none  Procedures: none   Antimicrobials: rocephin.    Subjective:  no new complaints.   Objective: Vitals:   06/16/16 1804 06/16/16 2220 06/17/16 0509 06/17/16 1525  BP: (!) 162/87 (!) 176/71 135/62 (!) 142/62  Pulse: 72 (!) 59 (!) 56 62  Resp: 16 16 17 18   Temp: 98.8 F (37.1 C) 97.7 F (36.5 C) 97.9 F (36.6 C) 98 F (36.7 C)  TempSrc: Oral Oral Oral Oral  SpO2: 100% 99% 98% 98%    Intake/Output Summary (Last 24 hours) at 06/17/16 1917 Last data filed at 06/17/16 1900  Gross per 24 hour  Intake             1280 ml  Output             2150 ml  Net             -870 ml   There were no vitals filed for this visit.  Examination:  General exam: Appears calm and comfortable  Respiratory system: Clear to auscultation. Respiratory effort normal. Cardiovascular system: S1 & S2 heard, RRR. No JVD, murmurs, rubs, gallops or clicks. No pedal edema. Gastrointestinal  system: Abdomen is nondistended, soft and nontender. No organomegaly or masses felt. Normal bowel sounds heard. Central nervous system: Alert and oriented. No focal neurological deficits. Extremities: Symmetric 5 x 5 power. Skin: No rashes, lesions or ulcers Psychiatry: Judgement and insight appear normal. Mood & affect appropriate.     Data Reviewed: I have personally reviewed following labs and imaging studies  CBC:  Recent Labs Lab 06/13/16 2225 06/16/16 1356 06/16/16 1905 06/17/16 0005  WBC 9.3 5.9 6.1 6.8  NEUTROABS 7.4 3.6  --   --   HGB 12.8 14.5 14.0 13.3  HCT 40.6 44.8 43.5 41.0  MCV 96.9 96.1 96.7 96.0  PLT 134* 174 166 [id number]   Basic Metabolic Panel:  Recent Labs Lab 06/13/16 2225 06/16/16 1355 06/16/16 1905 06/17/16 0005  NA 138 138  --  138  K 4.1 4.2  --  4.7  CL 107 105  --  110  CO2 24 23  --  24  GLUCOSE 147* 149*  --  100*  BUN 13 12  --  11  CREATININE 1.07* 0.94 0.87 0.94  CALCIUM 9.3 10.3  --  9.4   GFR: Estimated Creatinine Clearance: 37.2 mL/min (by C-G formula based on SCr of 0.94 mg/dL). Liver Function Tests: No results for input(s): AST, ALT, ALKPHOS, BILITOT, PROT, ALBUMIN in the  last 168 hours. No results for input(s): LIPASE, AMYLASE in the last 168 hours. No results for input(s): AMMONIA in the last 168 hours. Coagulation Profile: No results for input(s): INR, PROTIME in the last 168 hours. Cardiac Enzymes: No results for input(s): CKTOTAL, CKMB, CKMBINDEX, TROPONINI in the last 168 hours. BNP (last 3 results) No results for input(s): PROBNP in the last 8760 hours. HbA1C: No results for input(s): HGBA1C in the last 72 hours. CBG: No results for input(s): GLUCAP in the last 168 hours. Lipid Profile: No results for input(s): CHOL, HDL, LDLCALC, TRIG, CHOLHDL, LDLDIRECT in the last 72 hours. Thyroid Function Tests: No results for input(s): TSH, T4TOTAL, FREET4, T3FREE, THYROIDAB in the last 72 hours. Anemia Panel: No results for  input(s): VITAMINB12, FOLATE, FERRITIN, TIBC, IRON, RETICCTPCT in the last 72 hours. Sepsis Labs:  Recent Labs Lab 06/16/16 1405 06/16/16 1905 06/17/16 0005  LATICACIDVEN 3.03* 1.5 1.3    Recent Results (from the past 240 hour(s))  Culture, blood (routine x 2)     Status: None (Preliminary result)   Collection Time: 06/13/16 10:20 PM  Result Value Ref Range Status   Specimen Description BLOOD RIGHT ARM  Final   Special Requests IN PEDIATRIC BOTTLE 3CC  Final   Culture NO GROWTH 3 DAYS  Final   Report Status PENDING  Incomplete  Culture, blood (routine x 2)     Status: Abnormal   Collection Time: 06/13/16 10:25 PM  Result Value Ref Range Status   Specimen Description BLOOD RIGHT ANTECUBITAL  Final   Special Requests BOTTLES DRAWN AEROBIC AND ANAEROBIC 5CC  Final   Culture  Setup Time   Final    GRAM NEGATIVE RODS IN BOTH AEROBIC AND ANAEROBIC BOTTLES K. NEWMAN, FLOW MANAGER, AT [place] ON [id number] BY S. YARBROUGH    Culture ESCHERICHIA COLI (A)  Final   Report Status 06/17/2016 FINAL  Final   Organism ID, Bacteria ESCHERICHIA COLI  Final      Susceptibility   Escherichia coli - MIC*    AMPICILLIN >=32 RESISTANT Resistant     CEFAZOLIN <=4 SENSITIVE Sensitive     CEFEPIME <=1 SENSITIVE Sensitive     CEFTAZIDIME <=1 SENSITIVE Sensitive     CEFTRIAXONE <=1 SENSITIVE Sensitive     CIPROFLOXACIN <=0.25 SENSITIVE Sensitive     GENTAMICIN 2 SENSITIVE Sensitive     IMIPENEM <=0.25 SENSITIVE Sensitive     TRIMETH/SULFA <=20 SENSITIVE Sensitive     AMPICILLIN/SULBACTAM 16 INTERMEDIATE Intermediate     PIP/TAZO <=4 SENSITIVE Sensitive     Extended ESBL NEGATIVE Sensitive     * ESCHERICHIA COLI  Blood Culture ID Panel (Reflexed)     Status: Abnormal   Collection Time: 06/13/16 10:25 PM  Result Value Ref Range Status   Enterococcus species NOT DETECTED NOT DETECTED Final   Vancomycin resistance NOT DETECTED NOT DETECTED Final   Listeria monocytogenes NOT DETECTED NOT DETECTED Final     Staphylococcus species NOT DETECTED NOT DETECTED Final   Staphylococcus aureus NOT DETECTED NOT DETECTED Final   Methicillin resistance NOT DETECTED NOT DETECTED Final   Streptococcus species NOT DETECTED NOT DETECTED Final   Streptococcus agalactiae NOT DETECTED NOT DETECTED Final   Streptococcus pneumoniae NOT DETECTED NOT DETECTED Final   Streptococcus pyogenes NOT DETECTED NOT DETECTED Final   Acinetobacter baumannii NOT DETECTED NOT DETECTED Final   Enterobacteriaceae species DETECTED (A) NOT DETECTED Final    Comment: CRITICAL RESULT CALLED TO, READ BACK BY AND VERIFIED WITH: K. NEWMAN, FLOW MANAGER  AT 1821 ON [id number] BY S. YARBROUGH    Enterobacter cloacae complex NOT DETECTED NOT DETECTED Final   Escherichia coli DETECTED (A) NOT DETECTED Final    Comment: CRITICAL RESULT CALLED TO, READ BACK BY AND VERIFIED WITH: K. NEWMAN, FLOW MANAGER AT 1821 ON [id number] BY S. YARBROUGH    Klebsiella oxytoca NOT DETECTED NOT DETECTED Final   Klebsiella pneumoniae NOT DETECTED NOT DETECTED Final   Proteus species NOT DETECTED NOT DETECTED Final   Serratia marcescens NOT DETECTED NOT DETECTED Final   Carbapenem resistance NOT DETECTED NOT DETECTED Final   Haemophilus influenzae NOT DETECTED NOT DETECTED Final   Neisseria meningitidis NOT DETECTED NOT DETECTED Final   Pseudomonas aeruginosa NOT DETECTED NOT DETECTED Final   Candida albicans NOT DETECTED NOT DETECTED Final   Candida glabrata NOT DETECTED NOT DETECTED Final   Candida krusei NOT DETECTED NOT DETECTED Final   Candida parapsilosis NOT DETECTED NOT DETECTED Final   Candida tropicalis NOT DETECTED NOT DETECTED Final  Culture, blood (single)     Status: None (Preliminary result)   Collection Time: 06/16/16  1:55 PM  Result Value Ref Range Status   Specimen Description BLOOD LEFT WRIST  Final   Special Requests BOTTLES DRAWN AEROBIC AND ANAEROBIC 5CC  Final   Culture NO GROWTH < 24 HOURS  Final   Report Status PENDING  Incomplete   Urine culture     Status: None   Collection Time: 06/16/16  4:12 PM  Result Value Ref Range Status   Specimen Description URINE, RANDOM  Final   Special Requests NONE  Final   Culture NO GROWTH  Final   Report Status 06/17/2016 FINAL  Final  Culture, blood (single)     Status: None (Preliminary result)   Collection Time: 06/16/16  4:35 PM  Result Value Ref Range Status   Specimen Description BLOOD RIGHT ANTECUBITAL  Final   Special Requests BOTTLES DRAWN AEROBIC AND ANAEROBIC 5CC  Final   Culture NO GROWTH < 24 HOURS  Final   Report Status PENDING  Incomplete         Radiology Studies: No results found.      Scheduled Meds: . amLODipine  5 mg Oral Daily  . aspirin EC  81 mg Oral QPM  . cefTRIAXone (ROCEPHIN)  IV  2 g Intravenous Q24H  . citalopram  10 mg Oral Daily  . clopidogrel  75 mg Oral Daily  . enoxaparin (LOVENOX) injection  40 mg Subcutaneous Q24H  . ezetimibe  10 mg Oral QPM  . feeding supplement (ENSURE ENLIVE)  237 mL Oral TID BM  . losartan  100 mg Oral Daily  . metoprolol  75 mg Oral BID  . omega-3 acid ethyl esters  1 g Oral QPM  . pantoprazole  40 mg Oral [id number]  . timolol  1 drop Both Eyes BID   Continuous Infusions: . sodium chloride 50 mL/hr at 06/16/16 1800     LOS: 0 days    Time spent: 30 minutes.     Hosie Poisson, MD [facility] Pager [phone]  If 7PM-7AM, please contact night-coverage [email or web address] Password [email or web address] 06/17/2016, 7:17 PM

## 2016-06-18 ENCOUNTER — Ambulatory Visit (INDEPENDENT_AMBULATORY_CARE_PROVIDER_SITE_OTHER): Payer: Medicare Other | Admitting: *Deleted

## 2016-06-18 DIAGNOSIS — R55 Syncope and collapse: Secondary | ICD-10-CM

## 2016-06-18 MED ORDER — OMEGA-3-ACID ETHYL ESTERS 1 G PO CAPS: 1.0000 g | ORAL_CAPSULE | Freq: Every evening | ORAL | Status: DC

## 2016-06-18 MED ORDER — LEVOFLOXACIN 750 MG PO TABS: 750.0000 mg | ORAL_TABLET | Freq: Every day | ORAL | 0 refills | Status: DC

## 2016-06-18 NOTE — Progress Notes (Signed)
Discussed discharge summary with patients daughter. Reviewed all medications with patient. Patient ready for discharge. Patient wheel chaired out.

## 2016-06-18 NOTE — Progress Notes (Signed)
Pharmacy Antibiotic Note ANETRIA CONDRA is a 82 y.o. female admitted on 06/16/2016 with E.coli bacteremia. Currently on day 3 of treatment.   Plan: Continue Ceftriaxone 2 grams IV every 24 hours     Temp (24hrs), Avg:97.9 F (36.6 C), Min:97.7 F (36.5 C), Max:98 F (36.7 C)   Recent Labs Lab 06/13/16 2225 06/16/16 1355 06/16/16 1356 06/16/16 1405 06/16/16 1905 06/17/16 0005  WBC 9.3  --  5.9  --  6.1 6.8  CREATININE 1.07* 0.94  --   --  0.87 0.94  LATICACIDVEN  --   --   --  3.03* 1.5 1.3    Estimated Creatinine Clearance: 35.7 mL/min (by C-G formula based on SCr of 0.94 mg/dL).    Allergies  Allergen Reactions  . Crestor [Rosuvastatin Calcium]     Muscle pain  . Lipitor [Atorvastatin Calcium]     Muscle pain    Antimicrobials this admission: 8/2 Ceftriaxone >>   Dose adjustments this admission: n/a  Microbiology results: 7/30 BCx: E.coli R amp, I Unasyn and S to all other agents 8/2 BCx: ngtd 8/2 UCx: ngF   Thank you for allowing pharmacy to be a part of this patient's care.  Vincenza Hews, PharmD, BCPS 06/18/2016, 11:11 AM Pager: [phone]

## 2016-06-18 NOTE — Progress Notes (Signed)
Carelink Summary Report / Loop Recorder 

## 2016-06-19 LAB — CULTURE, BLOOD (ROUTINE X 2): Culture: NO GROWTH

## 2016-06-21 ENCOUNTER — Telehealth: Payer: Self-pay | Admitting: Family Medicine

## 2016-06-21 ENCOUNTER — Inpatient Hospital Stay (HOSPITAL_COMMUNITY): Admission: RE | Admit: 2016-06-21 | Payer: Medicare Other | Source: Ambulatory Visit

## 2016-06-21 ENCOUNTER — Encounter (HOSPITAL_COMMUNITY): Payer: Self-pay | Admitting: Cardiovascular Disease

## 2016-06-21 LAB — CULTURE, BLOOD (SINGLE)
Culture: NO GROWTH
Culture: NO GROWTH

## 2016-06-21 NOTE — Telephone Encounter (Signed)
Call and spoke to [name]. She stated the pt is doing better. [name] states pt was changed from Keflex and started on Levaquin. She has all medications filled at home. After hours protocol was discussed and pt's daughter verbalized understanding. She was told to have her mother bring in all medications with her for this visit.

## 2016-06-21 NOTE — Discharge Summary (Signed)
Physician Discharge Summary  Julie Bass [id number] DOB: 01/11/1926 DOA: 06/16/2016  PCP: [name], MD  Admit date: 06/16/2016 Discharge date: 06/20/2016  Admitted From: Home Disposition:  Home.   Recommendations for Outpatient Follow-up:  1. Follow up with PCP in 1-2 weeks 2. Please obtain BMP/CBC in one week     Discharge Condition:STABLE CODE STATUS: FULL  Diet recommendation: Heart Healthy   Brief/Interim Summary: Julie Bass a 82 y.o.femalewith a medical history of peripheral vascular disease, hypertension, hyperlipidemia, presented to the emergency department for bacteremia.  Discharge Diagnoses:  Active Problems:   HTN (hypertension)   Hyperlipemia   PAD (peripheral artery disease) (HCC)   Physical deconditioning   Bacteremia   UTI (lower urinary tract infection)   E coli bacteremia: Started on IV rocephin. Blood cultures show e coli sensitive to rocephin and ciprofloxacin.  Repeat blood cultures done and pending, negative so far. Discharge pt on po levaquin to complete the course of 2 weeks. .    Lactic acidosis: resolved with fluid resuscitation.   Generalized weakness probably from the above.  Get PT eval.   Hypertension;  Well controlled.   PVD: ON aspirin and plavix.    Discharge Instructions  Discharge Instructions    Diet - low sodium heart healthy    Complete by:  As directed   Discharge instructions    Complete by:  As directed   Follow up with PCP in one week.       Medication List    STOP taking these medications   cephALEXin 500 MG capsule Commonly known as:  KEFLEX     TAKE these medications   amLODipine 5 MG tablet Commonly known as:  NORVASC TAKE 1 TABLET (5 MG TOTAL) BY MOUTH DAILY. What changed:  See the new instructions.   aspirin EC 81 MG tablet Take 81 mg by mouth every evening.   citalopram 10 MG tablet Commonly known as:  CELEXA TAKE 1 TABLET (10 MG TOTAL) BY MOUTH DAILY.    clopidogrel 75 MG tablet Commonly known as:  PLAVIX Take 1 tablet (75 mg total) by mouth daily.   ezetimibe 10 MG tablet Commonly known as:  ZETIA Take 1 tablet (10 mg total) by mouth daily. What changed:  when to take this   feeding supplement (ENSURE COMPLETE) Liqd Take 237 mLs by mouth 3 (three) times daily between meals.   Krill Oil 1000 MG Caps Take 1,000 mg by mouth every evening.   levofloxacin 750 MG tablet Commonly known as:  LEVAQUIN Take 1 tablet (750 mg total) by mouth daily.   losartan 100 MG tablet Commonly known as:  COZAAR Take 1 tablet (100 mg total) by mouth daily.   metoprolol 50 MG tablet Commonly known as:  LOPRESSOR Take 1.5 tablets (75 mg total) by mouth 2 (two) times daily. Take 75 mg twice daily   omega-3 acid ethyl esters 1 g capsule Commonly known as:  LOVAZA Take 1 capsule (1 g total) by mouth every evening.   pantoprazole 40 MG tablet Commonly known as:  PROTONIX Take 1 tablet (40 mg total) by mouth 2 (two) times daily. What changed:  when to take this   Pitavastatin Calcium 2 MG Tabs Commonly known as:  LIVALO Take 1 tablet (2 mg total) by mouth daily. What changed:  when to take this   SENIOR MULTIVITAMIN PLUS PO Take 1 tablet by mouth daily.   timolol 0.5 % ophthalmic solution Commonly known as:  TIMOPTIC Place 1 drop  into both eyes 2 (two) times daily.      Follow-up Information    [name], MD. Schedule an appointment as soon as possible for a visit in 1 week(s).   Specialty:  Family Medicine Contact information: [address] [place] [address] [phone]          Allergies  Allergen Reactions  . Crestor [Rosuvastatin Calcium]     Muscle pain  . Lipitor [Atorvastatin Calcium]     Muscle pain    Consultations:  none   Procedures/Studies: Dg Chest 2 View  Result Date: 06/13/2016 CLINICAL DATA:  Fever. EXAM: CHEST  2 VIEW COMPARISON:  Radiographs 08/23/2014 FINDINGS: Unchanged  mediastinal contours. Heart upper limits normal in size. Tortuosity and atherosclerosis of the thoracic aorta. Implanted monitoring device in the left anterior chest wall. Mild chronic hyperinflation. Pulmonary vasculature is normal. No consolidation, pleural effusion, or pneumothorax. Limited thoracic spine assessment. Reverse right shoulder arthroplasty. IMPRESSION: 1. Stable mild hyperinflation without acute process. No evidence of pneumonia. 2. Aortic atherosclerosis. Electronically Signed   By: [name] M.D.   On: 06/13/2016 23:19      Subjective: No new complaints.   Discharge Exam: Vitals:   06/18/16 0500 06/18/16 1414  BP: (!) 168/59 (!) 144/109  Pulse: (!) 56 62  Resp: 17 16  Temp: 98 F (36.7 C) 97.7 F (36.5 C)   Vitals:   06/17/16 2133 06/18/16 0300 06/18/16 0500 06/18/16 1414  BP: (!) 150/75  (!) 168/59 (!) 144/109  Pulse: 67  (!) 56 62  Resp: 17  17 16   Temp: 97.7 F (36.5 C)  98 F (36.7 C) 97.7 F (36.5 C)  TempSrc: Oral  Oral Oral  SpO2: 99%  100% 100%  Height:  5\' 4"  (1.626 m)      General: Pt is alert, awake, not in acute distress Cardiovascular: RRR, S1/S2 +, no rubs, no gallops Respiratory: CTA bilaterally, no wheezing, no rhonchi Abdominal: Soft, NT, ND, bowel sounds + Extremities: no edema, no cyanosis    The results of significant diagnostics from this hospitalization (including imaging, microbiology, ancillary and laboratory) are listed below for reference.     Microbiology: Recent Results (from the past 240 hour(s))  Culture, blood (routine x 2)     Status: None   Collection Time: 06/13/16 10:20 PM  Result Value Ref Range Status   Specimen Description BLOOD RIGHT ARM  Final   Special Requests IN PEDIATRIC BOTTLE 3CC  Final   Culture NO GROWTH 5 DAYS  Final   Report Status 06/19/2016 FINAL  Final  Culture, blood (routine x 2)     Status: Abnormal   Collection Time: 06/13/16 10:25 PM  Result Value Ref Range Status   Specimen  Description BLOOD RIGHT ANTECUBITAL  Final   Special Requests BOTTLES DRAWN AEROBIC AND ANAEROBIC 5CC  Final   Culture  Setup Time   Final    GRAM NEGATIVE RODS IN BOTH AEROBIC AND ANAEROBIC BOTTLES K. NEWMAN, FLOW MANAGER, AT [id number] ON [id number] BY S. YARBROUGH    Culture ESCHERICHIA COLI (A)  Final   Report Status 06/17/2016 FINAL  Final   Organism ID, Bacteria ESCHERICHIA COLI  Final      Susceptibility   Escherichia coli - MIC*    AMPICILLIN >=32 RESISTANT Resistant     CEFAZOLIN <=4 SENSITIVE Sensitive     CEFEPIME <=1 SENSITIVE Sensitive     CEFTAZIDIME <=1 SENSITIVE Sensitive     CEFTRIAXONE <=1 SENSITIVE Sensitive  CIPROFLOXACIN <=0.25 SENSITIVE Sensitive     GENTAMICIN 2 SENSITIVE Sensitive     IMIPENEM <=0.25 SENSITIVE Sensitive     TRIMETH/SULFA <=20 SENSITIVE Sensitive     AMPICILLIN/SULBACTAM 16 INTERMEDIATE Intermediate     PIP/TAZO <=4 SENSITIVE Sensitive     Extended ESBL NEGATIVE Sensitive     * ESCHERICHIA COLI  Blood Culture ID Panel (Reflexed)     Status: Abnormal   Collection Time: 06/13/16 10:25 PM  Result Value Ref Range Status   Enterococcus species NOT DETECTED NOT DETECTED Final   Vancomycin resistance NOT DETECTED NOT DETECTED Final   Listeria monocytogenes NOT DETECTED NOT DETECTED Final   Staphylococcus species NOT DETECTED NOT DETECTED Final   Staphylococcus aureus NOT DETECTED NOT DETECTED Final   Methicillin resistance NOT DETECTED NOT DETECTED Final   Streptococcus species NOT DETECTED NOT DETECTED Final   Streptococcus agalactiae NOT DETECTED NOT DETECTED Final   Streptococcus pneumoniae NOT DETECTED NOT DETECTED Final   Streptococcus pyogenes NOT DETECTED NOT DETECTED Final   Acinetobacter baumannii NOT DETECTED NOT DETECTED Final   Enterobacteriaceae species DETECTED (A) NOT DETECTED Final    Comment: CRITICAL RESULT CALLED TO, READ BACK BY AND VERIFIED WITH: K. NEWMAN, FLOW MANAGER AT 1821 ON [id number] BY S. YARBROUGH    Enterobacter cloacae  complex NOT DETECTED NOT DETECTED Final   Escherichia coli DETECTED (A) NOT DETECTED Final    Comment: CRITICAL RESULT CALLED TO, READ BACK BY AND VERIFIED WITH: K. NEWMAN, FLOW MANAGER AT 1821 ON [id number] BY S. YARBROUGH    Klebsiella oxytoca NOT DETECTED NOT DETECTED Final   Klebsiella pneumoniae NOT DETECTED NOT DETECTED Final   Proteus species NOT DETECTED NOT DETECTED Final   Serratia marcescens NOT DETECTED NOT DETECTED Final   Carbapenem resistance NOT DETECTED NOT DETECTED Final   Haemophilus influenzae NOT DETECTED NOT DETECTED Final   Neisseria meningitidis NOT DETECTED NOT DETECTED Final   Pseudomonas aeruginosa NOT DETECTED NOT DETECTED Final   Candida albicans NOT DETECTED NOT DETECTED Final   Candida glabrata NOT DETECTED NOT DETECTED Final   Candida krusei NOT DETECTED NOT DETECTED Final   Candida parapsilosis NOT DETECTED NOT DETECTED Final   Candida tropicalis NOT DETECTED NOT DETECTED Final  Culture, blood (single)     Status: None (Preliminary result)   Collection Time: 06/16/16  1:55 PM  Result Value Ref Range Status   Specimen Description BLOOD LEFT WRIST  Final   Special Requests BOTTLES DRAWN AEROBIC AND ANAEROBIC 5CC  Final   Culture NO GROWTH 4 DAYS  Final   Report Status PENDING  Incomplete  Urine culture     Status: None   Collection Time: 06/16/16  4:12 PM  Result Value Ref Range Status   Specimen Description URINE, RANDOM  Final   Special Requests NONE  Final   Culture NO GROWTH  Final   Report Status 06/17/2016 FINAL  Final  Culture, blood (single)     Status: None (Preliminary result)   Collection Time: 06/16/16  4:35 PM  Result Value Ref Range Status   Specimen Description BLOOD RIGHT ANTECUBITAL  Final   Special Requests BOTTLES DRAWN AEROBIC AND ANAEROBIC 5CC  Final   Culture NO GROWTH 4 DAYS  Final   Report Status PENDING  Incomplete     Labs: BNP (last 3 results) No results for input(s): BNP in the last 8760 hours. Basic Metabolic  Panel:  Recent Labs Lab 06/16/16 1355 06/16/16 1905 06/17/16 0005  NA 138  --  138  K 4.2  --  4.7  CL 105  --  110  CO2 23  --  24  GLUCOSE 149*  --  100*  BUN 12  --  11  CREATININE 0.94 0.87 0.94  CALCIUM 10.3  --  9.4   Liver Function Tests: No results for input(s): AST, ALT, ALKPHOS, BILITOT, PROT, ALBUMIN in the last 168 hours. No results for input(s): LIPASE, AMYLASE in the last 168 hours. No results for input(s): AMMONIA in the last 168 hours. CBC:  Recent Labs Lab 06/16/16 1356 06/16/16 1905 06/17/16 0005  WBC 5.9 6.1 6.8  NEUTROABS 3.6  --   --   HGB 14.5 14.0 13.3  HCT 44.8 43.5 41.0  MCV 96.1 96.7 96.0  PLT 174 166 183   Cardiac Enzymes: No results for input(s): CKTOTAL, CKMB, CKMBINDEX, TROPONINI in the last 168 hours. BNP: Invalid input(s): POCBNP CBG: No results for input(s): GLUCAP in the last 168 hours. D-Dimer No results for input(s): DDIMER in the last 72 hours. Hgb A1c No results for input(s): HGBA1C in the last 72 hours. Lipid Profile No results for input(s): CHOL, HDL, LDLCALC, TRIG, CHOLHDL, LDLDIRECT in the last 72 hours. Thyroid function studies No results for input(s): TSH, T4TOTAL, T3FREE, THYROIDAB in the last 72 hours.  Invalid input(s): FREET3 Anemia work up No results for input(s): VITAMINB12, FOLATE, FERRITIN, TIBC, IRON, RETICCTPCT in the last 72 hours. Urinalysis    Component Value Date/Time   COLORURINE YELLOW 06/16/2016 1612   APPEARANCEUR CLEAR 06/16/2016 1612   LABSPEC 1.022 06/16/2016 1612   PHURINE 6.5 06/16/2016 1612   GLUCOSEU NEGATIVE 06/16/2016 1612   HGBUR NEGATIVE 06/16/2016 1612   BILIRUBINUR NEGATIVE 06/16/2016 1612   BILIRUBINUR NEG 09/29/2011 1622   KETONESUR NEGATIVE 06/16/2016 1612   PROTEINUR NEGATIVE 06/16/2016 1612   UROBILINOGEN 1 10/08/2014 1529   NITRITE NEGATIVE 06/16/2016 1612   LEUKOCYTESUR TRACE (A) 06/16/2016 1612   Sepsis Labs Invalid input(s): PROCALCITONIN,  WBC,   LACTICIDVEN Microbiology Recent Results (from the past 240 hour(s))  Culture, blood (routine x 2)     Status: None   Collection Time: 06/13/16 10:20 PM  Result Value Ref Range Status   Specimen Description BLOOD RIGHT ARM  Final   Special Requests IN PEDIATRIC BOTTLE 3CC  Final   Culture NO GROWTH 5 DAYS  Final   Report Status 06/19/2016 FINAL  Final  Culture, blood (routine x 2)     Status: Abnormal   Collection Time: 06/13/16 10:25 PM  Result Value Ref Range Status   Specimen Description BLOOD RIGHT ANTECUBITAL  Final   Special Requests BOTTLES DRAWN AEROBIC AND ANAEROBIC 5CC  Final   Culture  Setup Time   Final    GRAM NEGATIVE RODS IN BOTH AEROBIC AND ANAEROBIC BOTTLES K. NEWMAN, FLOW MANAGER, AT [place] ON [id number] BY S. YARBROUGH    Culture ESCHERICHIA COLI (A)  Final   Report Status 06/17/2016 FINAL  Final   Organism ID, Bacteria ESCHERICHIA COLI  Final      Susceptibility   Escherichia coli - MIC*    AMPICILLIN >=32 RESISTANT Resistant     CEFAZOLIN <=4 SENSITIVE Sensitive     CEFEPIME <=1 SENSITIVE Sensitive     CEFTAZIDIME <=1 SENSITIVE Sensitive     CEFTRIAXONE <=1 SENSITIVE Sensitive     CIPROFLOXACIN <=0.25 SENSITIVE Sensitive     GENTAMICIN 2 SENSITIVE Sensitive     IMIPENEM <=0.25 SENSITIVE Sensitive     TRIMETH/SULFA <=20 SENSITIVE Sensitive  AMPICILLIN/SULBACTAM 16 INTERMEDIATE Intermediate     PIP/TAZO <=4 SENSITIVE Sensitive     Extended ESBL NEGATIVE Sensitive     * ESCHERICHIA COLI  Blood Culture ID Panel (Reflexed)     Status: Abnormal   Collection Time: 06/13/16 10:25 PM  Result Value Ref Range Status   Enterococcus species NOT DETECTED NOT DETECTED Final   Vancomycin resistance NOT DETECTED NOT DETECTED Final   Listeria monocytogenes NOT DETECTED NOT DETECTED Final   Staphylococcus species NOT DETECTED NOT DETECTED Final   Staphylococcus aureus NOT DETECTED NOT DETECTED Final   Methicillin resistance NOT DETECTED NOT DETECTED Final   Streptococcus  species NOT DETECTED NOT DETECTED Final   Streptococcus agalactiae NOT DETECTED NOT DETECTED Final   Streptococcus pneumoniae NOT DETECTED NOT DETECTED Final   Streptococcus pyogenes NOT DETECTED NOT DETECTED Final   Acinetobacter baumannii NOT DETECTED NOT DETECTED Final   Enterobacteriaceae species DETECTED (A) NOT DETECTED Final    Comment: CRITICAL RESULT CALLED TO, READ BACK BY AND VERIFIED WITH: K. NEWMAN, FLOW MANAGER AT 1821 ON [id number] BY S. YARBROUGH    Enterobacter cloacae complex NOT DETECTED NOT DETECTED Final   Escherichia coli DETECTED (A) NOT DETECTED Final    Comment: CRITICAL RESULT CALLED TO, READ BACK BY AND VERIFIED WITH: K. NEWMAN, FLOW MANAGER AT 1821 ON [id number] BY S. YARBROUGH    Klebsiella oxytoca NOT DETECTED NOT DETECTED Final   Klebsiella pneumoniae NOT DETECTED NOT DETECTED Final   Proteus species NOT DETECTED NOT DETECTED Final   Serratia marcescens NOT DETECTED NOT DETECTED Final   Carbapenem resistance NOT DETECTED NOT DETECTED Final   Haemophilus influenzae NOT DETECTED NOT DETECTED Final   Neisseria meningitidis NOT DETECTED NOT DETECTED Final   Pseudomonas aeruginosa NOT DETECTED NOT DETECTED Final   Candida albicans NOT DETECTED NOT DETECTED Final   Candida glabrata NOT DETECTED NOT DETECTED Final   Candida krusei NOT DETECTED NOT DETECTED Final   Candida parapsilosis NOT DETECTED NOT DETECTED Final   Candida tropicalis NOT DETECTED NOT DETECTED Final  Culture, blood (single)     Status: None (Preliminary result)   Collection Time: 06/16/16  1:55 PM  Result Value Ref Range Status   Specimen Description BLOOD LEFT WRIST  Final   Special Requests BOTTLES DRAWN AEROBIC AND ANAEROBIC 5CC  Final   Culture NO GROWTH 4 DAYS  Final   Report Status PENDING  Incomplete  Urine culture     Status: None   Collection Time: 06/16/16  4:12 PM  Result Value Ref Range Status   Specimen Description URINE, RANDOM  Final   Special Requests NONE  Final   Culture NO  GROWTH  Final   Report Status 06/17/2016 FINAL  Final  Culture, blood (single)     Status: None (Preliminary result)   Collection Time: 06/16/16  4:35 PM  Result Value Ref Range Status   Specimen Description BLOOD RIGHT ANTECUBITAL  Final   Special Requests BOTTLES DRAWN AEROBIC AND ANAEROBIC 5CC  Final   Culture NO GROWTH 4 DAYS  Final   Report Status PENDING  Incomplete     Time coordinating discharge: Over 30 minutes  SIGNED:   Hosie Poisson, MD  [facility] 06/21/2016, 12:03 AM Pager   If 7PM-7AM, please contact night-coverage [email or web address] Password [email or web address]

## 2016-06-21 NOTE — Telephone Encounter (Signed)
Left message for pt to call. Pt needs to make and appt for hospital follow up and TOC call needs to be documented.

## 2016-06-22 ENCOUNTER — Encounter (HOSPITAL_COMMUNITY): Payer: Self-pay | Admitting: Cardiovascular Disease

## 2016-06-24 ENCOUNTER — Ambulatory Visit (HOSPITAL_COMMUNITY)
Admission: RE | Admit: 2016-06-24 | Discharge: 2016-06-24 | Disposition: A | Discharge status: Home / Self Care | Payer: Medicare Other | Source: Ambulatory Visit | Attending: Cardiology | Admitting: Cardiology

## 2016-06-24 DIAGNOSIS — I7789 Other specified disorders of arteries and arterioles: Secondary | ICD-10-CM | POA: Diagnosis not present

## 2016-06-24 DIAGNOSIS — K219 Gastro-esophageal reflux disease without esophagitis: Secondary | ICD-10-CM | POA: Insufficient documentation

## 2016-06-24 DIAGNOSIS — E785 Hyperlipidemia, unspecified: Secondary | ICD-10-CM | POA: Diagnosis not present

## 2016-06-24 DIAGNOSIS — I739 Peripheral vascular disease, unspecified: Secondary | ICD-10-CM | POA: Diagnosis not present

## 2016-06-24 DIAGNOSIS — F329 Major depressive disorder, single episode, unspecified: Secondary | ICD-10-CM | POA: Diagnosis not present

## 2016-06-24 DIAGNOSIS — N189 Chronic kidney disease, unspecified: Secondary | ICD-10-CM | POA: Insufficient documentation

## 2016-06-24 DIAGNOSIS — R938 Abnormal findings on diagnostic imaging of other specified body structures: Secondary | ICD-10-CM | POA: Diagnosis not present

## 2016-06-24 DIAGNOSIS — I129 Hypertensive chronic kidney disease with stage 1 through stage 4 chronic kidney disease, or unspecified chronic kidney disease: Secondary | ICD-10-CM | POA: Insufficient documentation

## 2016-06-24 DIAGNOSIS — I70202 Unspecified atherosclerosis of native arteries of extremities, left leg: Secondary | ICD-10-CM | POA: Insufficient documentation

## 2016-06-29 ENCOUNTER — Encounter: Payer: Self-pay | Admitting: Family Medicine

## 2016-06-29 ENCOUNTER — Ambulatory Visit (INDEPENDENT_AMBULATORY_CARE_PROVIDER_SITE_OTHER): Payer: Medicare Other | Admitting: Family Medicine

## 2016-06-29 VITALS — BP 122/72 | HR 55 | Wt 126.0 lb

## 2016-06-29 DIAGNOSIS — I739 Peripheral vascular disease, unspecified: Secondary | ICD-10-CM | POA: Diagnosis not present

## 2016-06-29 DIAGNOSIS — N390 Urinary tract infection, site not specified: Secondary | ICD-10-CM | POA: Diagnosis not present

## 2016-06-29 DIAGNOSIS — E872 Acidosis, unspecified: Secondary | ICD-10-CM

## 2016-06-29 LAB — POCT URINALYSIS DIPSTICK
Bilirubin, UA: NEGATIVE
Blood, UA: NEGATIVE
Glucose, UA: NEGATIVE
Ketones, UA: NEGATIVE
Nitrite, UA: NEGATIVE
Protein, UA: POSITIVE
Spec Grav, UA: >=1.03
Urobilinogen, UA: NEGATIVE
pH, UA: 6

## 2016-06-29 MED ORDER — LEVOFLOXACIN 500 MG PO TABS: 500.0000 mg | ORAL_TABLET | Freq: Every day | ORAL | 0 refills | Status: DC

## 2016-06-29 NOTE — Progress Notes (Signed)
   Subjective:    Patient ID: Julie Bass, female    DOB: 12-Nov-1926, 82 y.o.   MRN: [id number]  HPI She was in the hospital approximately one week ago and treated for a UTI. She also has underlying peripheral artery disease. While in the hospital she was noted to be acidotic. She was recently evaluated by cardiology. The results are pending. Presently she does complain of weakness especially in her legs. She apparently finished the Levaquin for treatment of UTI yesterday.   Review of Systems     Objective:   Physical Exam Hospital discharge summary and lab data was reviewed. Alert and in no distress. Cardiac exam shows a regular rhythm without murmurs or gallops. Lungs are clear to auscultation. Abdominal exam shows no masses or tenderness. Urine dipstick was positive and microscopic showed white blood cells as well as bacteria and was relatively good clean catch.       Assessment & Plan:  UTI (lower urinary tract infection) - Plan: CBC with Differential/Platelet, Comprehensive metabolic panel, Urinalysis Dipstick  PAD (peripheral artery disease) ([place]) - Plan: CBC with Differential/Platelet, Comprehensive metabolic panel  Lactic acidosis - Plan: Lactic Acid, Plasma I will call in another week's worth of Levaquin just to be on the safe side.

## 2016-06-30 LAB — CBC WITH DIFFERENTIAL/PLATELET
Basophils Absolute: 58 cells/uL (ref 0–200)
Basophils Relative: 1 %
Eosinophils Absolute: 174 cells/uL (ref 15–500)
Eosinophils Relative: 3 %
HCT: 43.1 % (ref 35.0–45.0)
Hemoglobin: 14.1 g/dL (ref 11.7–15.5)
Lymphs Abs: 1798 cells/uL (ref 850–3900)
MCH: 30.7 pg (ref 27.0–33.0)
MCHC: 32.7 g/dL (ref 32.0–36.0)
MCV: 93.9 fL (ref 80.0–100.0)
MPV: 10.8 fL (ref 7.5–12.5)
Monocytes Absolute: 464 cells/uL (ref 200–950)
Monocytes Relative: 8 %
Neutro Abs: 3306 cells/uL (ref 1500–7800)
Neutrophils Relative %: 57 %
Platelets: 225 10*3/uL (ref 140–400)
RBC: 4.59 MIL/uL (ref 3.80–5.10)
RDW: 14 % (ref 11.0–15.0)
WBC: 5.8 10*3/uL (ref 4.0–10.5)

## 2016-06-30 LAB — COMPREHENSIVE METABOLIC PANEL
ALT: 15 U/L (ref 6–29)
AST: 19 U/L (ref 10–35)
Albumin: 3.8 g/dL (ref 3.6–5.1)
Alkaline Phosphatase: 40 U/L (ref 33–130)
BUN: 12 mg/dL (ref 7–25)
CO2: 22 mmol/L (ref 20–31)
Calcium: 9.6 mg/dL (ref 8.6–10.4)
Chloride: 106 mmol/L (ref 98–110)
Creat: 1.11 mg/dL — ABNORMAL HIGH (ref 0.60–0.88)
Glucose, Bld: 154 mg/dL — ABNORMAL HIGH (ref 65–99)
Potassium: 4.6 mmol/L (ref 3.5–5.3)
Sodium: 138 mmol/L (ref 135–146)
Total Bilirubin: 0.9 mg/dL (ref 0.2–1.2)
Total Protein: 6.5 g/dL (ref 6.1–8.1)

## 2016-07-13 LAB — CUP PACEART REMOTE DEVICE CHECK: Date Time Interrogation Session: 20170804090634

## 2016-07-13 NOTE — Progress Notes (Signed)
[facility] summary report received. Battery status OK. Normal device function. No new symptom episodes, tachy episodes, or brady episodes. No new AF episodes. 2 pause episodes previously addressed. Monthly summary reports and ROV/PRN 

## 2016-07-20 ENCOUNTER — Ambulatory Visit (INDEPENDENT_AMBULATORY_CARE_PROVIDER_SITE_OTHER): Payer: Medicare Other | Admitting: *Deleted

## 2016-07-20 DIAGNOSIS — R55 Syncope and collapse: Secondary | ICD-10-CM

## 2016-07-20 NOTE — Progress Notes (Signed)
Carelink Summary Report / Loop Recorder 

## 2016-08-15 LAB — CUP PACEART REMOTE DEVICE CHECK: Date Time Interrogation Session: 20170903090546

## 2016-08-17 ENCOUNTER — Ambulatory Visit (INDEPENDENT_AMBULATORY_CARE_PROVIDER_SITE_OTHER): Payer: Medicare Other | Admitting: *Deleted

## 2016-08-17 DIAGNOSIS — R55 Syncope and collapse: Secondary | ICD-10-CM

## 2016-08-18 NOTE — Progress Notes (Signed)
Carelink Summary Report / Loop Recorder 

## 2016-08-19 ENCOUNTER — Telehealth: Payer: Self-pay | Admitting: Cardiology

## 2016-08-19 NOTE — Telephone Encounter (Signed)
LMOVM requesting that pt send manual transmission b/c home monitor has not updated in at least 14 days.    

## 2016-08-26 ENCOUNTER — Encounter: Payer: Self-pay | Admitting: Cardiology

## 2016-08-27 ENCOUNTER — Telehealth: Payer: Self-pay | Admitting: *Deleted

## 2016-08-27 NOTE — Telephone Encounter (Signed)
Spoke with patient's daughter.  Requested that she send a manual transmission from patient's home monitor for a software update.  She verbalizes understanding and denies additional questions or concerns at this time.

## 2016-08-28 ENCOUNTER — Other Ambulatory Visit: Payer: Self-pay | Admitting: Cardiovascular Disease

## 2016-08-29 ENCOUNTER — Other Ambulatory Visit: Payer: Self-pay | Admitting: Cardiovascular Disease

## 2016-08-30 NOTE — Telephone Encounter (Signed)
Rx has been sent to the pharmacy electronically. ° °

## 2016-09-02 ENCOUNTER — Ambulatory Visit (INDEPENDENT_AMBULATORY_CARE_PROVIDER_SITE_OTHER): Payer: Medicare Other | Admitting: Family Medicine

## 2016-09-02 ENCOUNTER — Encounter: Payer: Self-pay | Admitting: Family Medicine

## 2016-09-02 VITALS — BP 130/80 | HR 55 | Wt 127.0 lb

## 2016-09-02 DIAGNOSIS — R1032 Left lower quadrant pain: Secondary | ICD-10-CM

## 2016-09-02 LAB — POCT URINALYSIS DIPSTICK
Bilirubin, UA: NEGATIVE
Blood, UA: NEGATIVE
Glucose, UA: NEGATIVE
Ketones, UA: NEGATIVE
Leukocytes, UA: NEGATIVE
Nitrite, UA: NEGATIVE
Protein, UA: NEGATIVE
Spec Grav, UA: 1.025
Urobilinogen, UA: NEGATIVE
pH, UA: 6

## 2016-09-02 NOTE — Telephone Encounter (Signed)
Manual transmission received on 08/28/16.  Software update for false RRT successful.

## 2016-09-02 NOTE — Progress Notes (Signed)
   Subjective:    Patient ID: Julie Bass, female    DOB: December 06, 1925, 82 y.o.   MRN: [id number]  HPI She complains of a one-day history that she describes as left upper quadrant pain however she points to the mid quadrant area. It is intermittent in nature lasting a minute or 2 with at least a half an hour in between of normal. It is not associated with nausea, vomiting, diarrhea, fever, chills. Her bowel movements have been normal. She has not had any frequency, urgency or dysuria. She cannot relate this to any particular foods.   Review of Systems     Objective:   Physical Exam Alert and in no distress. Tympanic membranes and canals are normal. Pharyngeal area is normal. Neck is supple without adenopathy or thyromegaly, 2-3 cm cystic lesion noted in the left neck(he has had this for several years and is actually smaller according to her). Cardiac exam shows a regular sinus rhythm without murmurs or gallops,Loud S2. Lungs are clear to auscultation. Urine dipstick is negative      Assessment & Plan:  Abdominal pain, left lower quadrant - Plan: POCT Urinalysis Dipstick  Splane to her and her son-in-law that at this point I don't see anything that can be treated. We will watch her closely and may need to reevaluate and order a scan on her if continued difficulty.

## 2016-09-04 ENCOUNTER — Ambulatory Visit (HOSPITAL_COMMUNITY)
Admission: EM | Admit: 2016-09-04 | Discharge: 2016-09-04 | Disposition: A | Discharge status: Home / Self Care | Payer: Medicare Other | Attending: Radiology | Admitting: Radiology

## 2016-09-04 ENCOUNTER — Encounter (HOSPITAL_COMMUNITY): Payer: Self-pay | Admitting: Emergency Medicine

## 2016-09-04 DIAGNOSIS — B0223 Postherpetic polyneuropathy: Secondary | ICD-10-CM | POA: Diagnosis not present

## 2016-09-04 MED ORDER — VALACYCLOVIR HCL 1 G PO TABS: 1000.0000 mg | ORAL_TABLET | Freq: Three times a day (TID) | ORAL | 0 refills | Status: AC

## 2016-09-04 MED ORDER — GABAPENTIN 100 MG PO CAPS: 100.0000 mg | ORAL_CAPSULE | Freq: Three times a day (TID) | ORAL | 0 refills | Status: DC

## 2016-09-04 NOTE — ED Provider Notes (Signed)
CSN: [id number]     Arrival date & time 09/04/16  1741 History   None    Chief Complaint  Patient presents with  . Pleurisy   (Consider location/radiation/quality/duration/timing/severity/associated sxs/prior Treatment) 82 y.o. female presents with intermittent  left flank pain Patient describes it as "shooting pain" that is sharp  lasting brief periods of time approxinmately 30 seconds  X 4 days. Condition is acute in nature. Condition is made better by nothing. Condition is made worse by nothing. Patient denies any treatment prior to there arrival at this facility. Patient denies any nausea, vomiting diarrhea, fever, pain with urination. Patient was seen at [facility] for similar complaints on 10.19.17  And per daughter at bedisde was recently discharged from the hospital for UTI       Past Medical History:  Diagnosis Date  . Arthritis   . Chronic kidney disease    KIDNEY STONES  . Dementia    BEGINNING STAGES   . Depression   . GERD (gastroesophageal reflux disease)   . Glaucoma   . Hiatal hernia   . HTN (hypertension) 10/18/2011  . Hyperlipemia 10/18/2011  . NSVT (nonsustained ventricular tachycardia) ([place]) 10/18/2011  . Osteoporosis   . PAD (peripheral artery disease) ([place]) 10/18/2011   h/o left SFA stent   Past Surgical History:  Procedure Laterality Date  . ABDOMINAL HYSTERECTOMY  1973  . CARDIAC CATHETERIZATION     2012  . CHOLECYSTECTOMY    . LOOP RECORDER IMPLANT N/A 08/27/2014   Procedure: LOOP RECORDER IMPLANT;  Surgeon: Coralyn Mark, MD;  Location: [place] [facility];  Service: Cardiovascular;  Laterality: N/A;  . LOWER EXTREMITY ANGIOGRAM  11/02/2007   stent of mid left SFA with 6x16mm EV3 self-expanding stent and 6x3 in prox region (Dr. Adora Fridge)  . LOWER EXTREMITY ANGIOGRAM    . LOWER EXTREMITY ARTERIAL DOPPLER  2013   right SFA with 50-69% diameter reduction, right PTA/peroneal occluded, L SFA prox to stent has narrowing with increased velocities >60% diameter  reduction, L SFA stent patent, L ATA w/occlusive disease, bilat ABIs show mild arterial insuffiency at rest  . NM MYOCAR PERF WALL MOTION  10/2011   non-gated - normal stuy, EF 82%, normal LV wall motion  . REVERSE SHOULDER ARTHROPLASTY Right 01/26/2013   Procedure: RIGHT REVERSE TOTAL SHOULDER ARTHROPLASTY;  Surgeon: Augustin Schooling, MD;  Location: [facility];  Service: Orthopedics;  Laterality: Right;  . TRANSTHORACIC ECHOCARDIOGRAM  10/2012   EF 75-70%, mod conc hypertrophy, severely calcified MV annulus, LA mildly dailted, PA peak pressure 54mmHg   Family History  Problem Relation Age of Onset  . Throat cancer Father   . Cancer Mother   . CAD Son   . Breast cancer Sister     cervical cancer  . Colon cancer Brother     throat cancer  . Pulmonary embolism Daughter   . Hypertension Daughter    Social History  Substance Use Topics  . Smoking status: Former Smoker    Quit date: 05/07/1984  . Smokeless tobacco: Never Used  . Alcohol use No   OB History    No data available     Review of Systems  Musculoskeletal:       Left Flank pain    Allergies  Crestor [rosuvastatin calcium] and Lipitor [atorvastatin calcium]  Home Medications   Prior to Admission medications   Medication Sig Start Date End Date Taking? Authorizing Provider  amLODipine (NORVASC) 5 MG tablet TAKE 1 TABLET (5 MG TOTAL) BY  MOUTH DAILY. 08/30/16   Troy Sine, MD  aspirin EC 81 MG tablet Take 81 mg by mouth every evening.     Historical Provider, MD  citalopram (CELEXA) 10 MG tablet TAKE 1 TABLET (10 MG TOTAL) BY MOUTH DAILY. 06/16/16   Troy Sine, MD  clopidogrel (PLAVIX) 75 MG tablet Take 1 tablet (75 mg total) by mouth daily. 04/09/16   Troy Sine, MD  ezetimibe (ZETIA) 10 MG tablet Take 1 tablet (10 mg total) by mouth daily. Patient taking differently: Take 10 mg by mouth every evening.  06/12/15   Troy Sine, MD  feeding supplement, ENSURE COMPLETE, (ENSURE COMPLETE) LIQD Take 237 mLs by mouth 3  (three) times daily between meals. 08/27/14   Rhonda G Barrett, PA-C  gabapentin (NEURONTIN) 100 MG capsule Take 1 capsule (100 mg total) by mouth 3 (three) times daily. 09/04/16 09/18/16  Jacqualine Mau, NP  Krill Oil 1000 MG CAPS Take 1,000 mg by mouth every evening.     Historical Provider, MD  losartan (COZAAR) 100 MG tablet Take 1 tablet (100 mg total) by mouth daily. 04/09/16   Troy Sine, MD  metoprolol (LOPRESSOR) 50 MG tablet Take 1.5 tablets (75 mg total) by mouth 2 (two) times daily. Take 75 mg twice daily 02/09/16   Troy Sine, MD  Multiple Vitamins-Minerals (SENIOR MULTIVITAMIN PLUS PO) Take 1 tablet by mouth daily.    Historical Provider, MD  omega-3 acid ethyl esters (LOVAZA) 1 g capsule Take 1 capsule (1 g total) by mouth every evening. 06/18/16   Hosie Poisson, MD  pantoprazole (PROTONIX) 40 MG tablet Take 1 tablet (40 mg total) by mouth 2 (two) times daily. Patient taking differently: Take 40 mg by mouth every morning.  02/09/16   Troy Sine, MD  Pitavastatin Calcium (LIVALO) 2 MG TABS Take 1 tablet (2 mg total) by mouth daily. Patient taking differently: Take 1 tablet by mouth every evening.  05/14/16   Troy Sine, MD  timolol (TIMOPTIC) 0.5 % ophthalmic solution Place 1 drop into both eyes 2 (two) times daily.     Historical Provider, MD  valACYclovir (VALTREX) 1000 MG tablet Take 1 tablet (1,000 mg total) by mouth 3 (three) times daily. 09/04/16 09/11/16  Jacqualine Mau, NP   Meds Ordered and Administered this Visit  Medications - No data to display  BP 140/74 (BP Location: Right Arm)   Pulse (!) 56   Temp 97.4 F (36.3 C) (Oral)   Resp 16   SpO2 98%  No data found.   Physical Exam  Constitutional: She is oriented to person, place, and time. She appears well-developed and well-nourished.  HENT:  Head: Normocephalic and atraumatic.  Eyes: Conjunctivae are normal.  Neck: Normal range of motion.  Pulmonary/Chest: Effort normal.  Neurological: She  is alert and oriented to person, place, and time.  Skin:  Blisters area to left rib cage with wide stripes  Psychiatric: She has a normal mood and affect.  Nursing note and vitals reviewed.   Urgent Care Course   Clinical Course    Procedures (including critical care time)  Labs Review Labs Reviewed - No data to display  Imaging Review No results found.       MDM   1. Shingles (herpes zoster) polyneuropathy      Jacqualine Mau, NP 09/04/16 1844

## 2016-09-04 NOTE — ED Triage Notes (Addendum)
The patient presented to the [facility] with her mother with a complaint of left sided rib pain that started 1 week ago. The patient denied any injury or fall. The patient stated that the pain was intermittent and sharpe in nature.The patient denied any pain at the time of triage.

## 2016-09-04 NOTE — Discharge Instructions (Signed)
Take tylenol or ibuprofen as needed for pain control. Follow the back of the box for directions.

## 2016-09-08 ENCOUNTER — Telehealth: Payer: Self-pay | Admitting: Family Medicine

## 2016-09-08 NOTE — Telephone Encounter (Signed)
[name] called to let you know that her mother, Julie Bass, kept complaining of side pain so she took her to Urgent Care and she was diagnosed with shingles  She went to [facility] Urgent care so her notes are in the system  [name] said they would just follow up as needed, unless you need to see her

## 2016-09-16 ENCOUNTER — Ambulatory Visit (INDEPENDENT_AMBULATORY_CARE_PROVIDER_SITE_OTHER): Payer: Medicare Other | Admitting: *Deleted

## 2016-09-16 DIAGNOSIS — R55 Syncope and collapse: Secondary | ICD-10-CM | POA: Diagnosis not present

## 2016-09-16 NOTE — Progress Notes (Signed)
Carelink Summary Report / Loop Recorder 

## 2016-09-17 ENCOUNTER — Telehealth: Payer: Self-pay | Admitting: *Deleted

## 2016-09-17 DIAGNOSIS — I472 Ventricular tachycardia, unspecified: Secondary | ICD-10-CM

## 2016-09-17 NOTE — Telephone Encounter (Signed)
Pt daughter returning your call. Please call her back at her extension -- [phone].

## 2016-09-17 NOTE — Telephone Encounter (Signed)
LMOVM requesting call back.  Gave [facility] phone number for return call. 

## 2016-09-17 NOTE — Telephone Encounter (Signed)
Spoke with patient's daughter.  She reports that the patient was asleep at the time of the episode and that she is not aware of any symptoms.  Patient typically wakes up in the late morning.  Episode reviewed by Dr. Rayann Heman, recommended further review by Dr. Sallyanne Kuster.  Placed in Dr. Langley Gauss folder for review.

## 2016-09-18 NOTE — Telephone Encounter (Signed)
Will schedule for a follow up echo and labs and then to see Dr. Claiborne Billings.  She is already on a relatively high beta blocker dose. [name], please order BMET and Mg and Echo for VT. Needs first available appt with Dr. Claiborne Billings after the echo is done, please.

## 2016-09-20 NOTE — Telephone Encounter (Signed)
Labs and echo ordered per MCr. Message forwarded to admin to schedule echo. Patient's daughter notified and given paperwork to take to lab.

## 2016-09-22 ENCOUNTER — Other Ambulatory Visit (HOSPITAL_COMMUNITY): Payer: Medicare Other

## 2016-09-25 LAB — CUP PACEART REMOTE DEVICE CHECK
Date Time Interrogation Session: 20171003093546
Implantable Pulse Generator Implant Date: 20151013

## 2016-09-25 NOTE — Progress Notes (Signed)
[facility] summary report received. Battery status false RRT 07/03/16, pt to upgrade software. Normal device function. No new symptom episodes, tachy episodes, brady, or pause episodes. No new AF episodes. Monthly summary reports and ROV/PRN

## 2016-09-28 ENCOUNTER — Other Ambulatory Visit: Payer: Self-pay

## 2016-09-28 DIAGNOSIS — I472 Ventricular tachycardia: Secondary | ICD-10-CM

## 2016-09-28 DIAGNOSIS — I10 Essential (primary) hypertension: Secondary | ICD-10-CM

## 2016-09-28 DIAGNOSIS — I4729 Other ventricular tachycardia: Secondary | ICD-10-CM

## 2016-09-28 DIAGNOSIS — R5383 Other fatigue: Secondary | ICD-10-CM

## 2016-09-28 DIAGNOSIS — E782 Mixed hyperlipidemia: Secondary | ICD-10-CM

## 2016-09-28 MED ORDER — METOPROLOL TARTRATE 50 MG PO TABS: ORAL_TABLET | ORAL | 3 refills | Status: DC

## 2016-10-12 ENCOUNTER — Other Ambulatory Visit: Payer: Self-pay

## 2016-10-12 MED ORDER — EZETIMIBE 10 MG PO TABS: 10.0000 mg | ORAL_TABLET | Freq: Every day | ORAL | 3 refills | Status: DC

## 2016-10-13 ENCOUNTER — Ambulatory Visit (HOSPITAL_COMMUNITY): Payer: Medicare Other | Attending: Cardiology

## 2016-10-13 ENCOUNTER — Other Ambulatory Visit: Payer: Self-pay

## 2016-10-13 DIAGNOSIS — I119 Hypertensive heart disease without heart failure: Secondary | ICD-10-CM | POA: Diagnosis not present

## 2016-10-13 DIAGNOSIS — I472 Ventricular tachycardia, unspecified: Secondary | ICD-10-CM

## 2016-10-13 DIAGNOSIS — I059 Rheumatic mitral valve disease, unspecified: Secondary | ICD-10-CM | POA: Diagnosis not present

## 2016-10-13 DIAGNOSIS — I351 Nonrheumatic aortic (valve) insufficiency: Secondary | ICD-10-CM | POA: Diagnosis not present

## 2016-10-13 DIAGNOSIS — I313 Pericardial effusion (noninflammatory): Secondary | ICD-10-CM | POA: Insufficient documentation

## 2016-10-13 DIAGNOSIS — E785 Hyperlipidemia, unspecified: Secondary | ICD-10-CM | POA: Insufficient documentation

## 2016-10-13 DIAGNOSIS — I739 Peripheral vascular disease, unspecified: Secondary | ICD-10-CM | POA: Diagnosis not present

## 2016-10-14 ENCOUNTER — Encounter: Payer: Self-pay | Admitting: Cardiovascular Disease

## 2016-10-14 ENCOUNTER — Ambulatory Visit (INDEPENDENT_AMBULATORY_CARE_PROVIDER_SITE_OTHER): Payer: Medicare Other | Admitting: Cardiovascular Disease

## 2016-10-14 VITALS — BP 148/80 | HR 56 | Ht 65.0 in | Wt 128.2 lb

## 2016-10-14 DIAGNOSIS — I10 Essential (primary) hypertension: Secondary | ICD-10-CM

## 2016-10-14 DIAGNOSIS — I739 Peripheral vascular disease, unspecified: Secondary | ICD-10-CM

## 2016-10-14 DIAGNOSIS — I472 Ventricular tachycardia: Secondary | ICD-10-CM

## 2016-10-14 DIAGNOSIS — I119 Hypertensive heart disease without heart failure: Secondary | ICD-10-CM

## 2016-10-14 DIAGNOSIS — I4729 Other ventricular tachycardia: Secondary | ICD-10-CM

## 2016-10-14 LAB — CBC
HCT: 43.1 % (ref 35.0–45.0)
Hemoglobin: 14.2 g/dL (ref 11.7–15.5)
MCH: 31.6 pg (ref 27.0–33.0)
MCHC: 32.9 g/dL (ref 32.0–36.0)
MCV: 96 fL (ref 80.0–100.0)
MPV: 10.8 fL (ref 7.5–12.5)
Platelets: 148 10*3/uL (ref 140–400)
RBC: 4.49 MIL/uL (ref 3.80–5.10)
RDW: 14.5 % (ref 11.0–15.0)
WBC: 6.4 10*3/uL (ref 3.8–10.8)

## 2016-10-14 MED ORDER — AMLODIPINE BESYLATE 5 MG PO TABS: 7.5000 mg | ORAL_TABLET | Freq: Every day | ORAL | 2 refills | Status: DC

## 2016-10-14 NOTE — Patient Instructions (Addendum)
Your physician has recommended you make the following change in your medication:   1.) increase the amlodipine to 7.5 mg at night.  Your physician wants you to follow-up in: 6 months or sooner if needed. You will receive a reminder letter in the mail two months in advance. If you don't receive a letter, please call our office to schedule the follow-up appointment.  If you need a refill on your cardiac medications before your next appointment, please call your pharmacy.

## 2016-10-14 NOTE — Progress Notes (Signed)
Patient ID: Julie Bass, female   DOB: 1926-07-01, 82 y.o.   MRN: [id number]    PCP: Dr. Jill Alexanders  HPI: Julie Bass is a 82 y.o. female who presents to the office today for a 19 month follow-up cardiology evaluation.  Ms. Fritsche has a history of hypertension, hyperlipidemia, peripheral vascular disease, as well as episodes of nonsustained ventricular tachycardia, for, which she's been treated with beta blocker therapy.  An echo Doppler study in December 2013 showed hyperdynamic LV function with an ejection fraction of at least 38%, grade 1 diastolic dysfunction, and dynamic obstruction in the mid cavity of the left ventricle with a gradient of 10 mm.  She had severely calcified mitral annulus and mild left atrial dilatation.  She has documented nonsustained ventricular tachycardia, which was picked up on a remote event monitor, and she has tolerated titration of her beta blocker therapy.  She also has had difficulty with statin intolerance for hyperlipidemia, but has tolerated livalo 2 mg daily.  She also has history of hypertension and has been on losartan 100 mg in addition to her metoprolol.  She has peripheral vascular disease and is status post stenting of her left SFA in the past.  She has one vessel runoff bilaterally and has moderate right SFA disease.  Her last LE Doppler study was in August 2013 , which showed occluded right PTA and peroneal occluded left PTA, left ATA with reconstitution of the dorsalis pedis artery.  ABIs were 0.88 on the right and 0.86 on the left.  She has been on aspirin 81 mg, and Plavix 75 mg   A 2 year followup echo Doppler study on 05/15/2014 showed an ejection fraction of 65-70% and there was grade 1 diastolic dysfunction.  There was moderate mitral annular calcification.  There was no mention of any dynamic obstruction.  Lower extremity Doppler studies revealed a patent left SFA stent with prior disease noted in several vessels, not significantly changed  from 2013.  She was hospitalized on 08/23/2014 after developing a syncopal spell.  She was not significantly orthostatic to her hospitalization.  Upon presentation her blood pressure was 170/58.  Her ECG showed sinus rhythm with mild ST changes laterally, and first degree heart block.  She was noted to have a single 15 beat run of nonsustained VT.  She ultimately underwent implantation of a loop recorder on 08/27/2014.  She underwent carotid duplex imaging and findings suggest 1-39% internal carotid artery stenosis bilaterally. The left vertebral artery is patent with antegrade flow but the the right vertebral artery was not able to be visualized.  After discharge, the loop recorder detected 2 episodes of questionable transient asystole.  Upon review of these tracings with Dr. Sallyanne Kuster it appears that these may very well be artifactual.  One episode was recorded on October 14, and another on October 16.  The episode in October 16 tach, rate of less than over 30 seconds.  The patient was with her daughter, who is my nurse at the time.  She denies any symptoms at this time.  Specifically, she denied any lightheadedness, and one would expect that with a >30 second pause she would have been significantly symptomatic and this would have also been noted by her daughter.  She denies any major episodes of dizziness but if she gets up quickly or lies down fast.  A CT of her abdomen showed atherosclerotic plaque throughout her abdominal aorta with suggestion of a tiny 0.5 x 0.2 cm atherosclerotic ulcer.  She was also noted to have plaque that was irregular throughout the right superficial femoral artery and also plaque in the popliteal artery, not resulting in hemodynamically significance stenosis.  She had single-vessel runoff to the right lower leg and foot.  The right anterior tibial artery with atretic reconstitution of both posterior tibial and peroneal vascular distributions.  The right dorsalis pedis artery was  patent to the level of the forefoot.  She had stents noted in the distal aspect of her left common femoral artery and single vessel runoff to the left lower leg and foot.  There was occlusion of both the left anterior, posterior tibial arteries.  The peroneal artery was found to reconstitute a dorsalis pedis artery at the level of the hindfoot.  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    She has underone neurologic evaluation with Dr. Corwin Levins and is felt to have moderate  dementia felt to be Alzheimer's  versus vascular dementia.  A CT scan of her head revealed mild cortical atrophy and moderately advanced small vessel ischemic changes involving the periventricular white matter with remote infarction in the right frontal lobe.   In October 2017, she was found to have an episode of irregular nonsustained wide complex tachycardia with a maximum rate at 167 bpm and ultimately reverted back to sinus rhythm.  She was completely asymptomatic and this occurred while she was in bed.  At that time, her metoprolol dose, which had been 75 Mill grams twice a day was increased to 100 mg in the morning and 75 mg at night.  She has continued to take losartan 100 mg daily as well as amlodipine 5 mg for blood pressure control.  She has felt well on the increased beta blocker regimen.  She has a history of hyperlipidemia and has been able to tolerate Livalo but could not tolerate other statins.  Her current Livalo dose is  mg daily and she also takes Zetia 10 mg.  She is on gabapentin for neuropathy.  She also is on Celexa.  She underwent an echo Doppler study yesterday, 10/13/2016 which showed an EF of 55-60%.  There was mild LVH.  There was grade 2 diastolic dysfunction with abnormal tissue Doppler suggesting high ventricular filling pressures.  Her aortic valve mobility was restricted but she did not have stenosis.  There was  mitral annular calcification.  There is mild left atrial dilatation.  There was mild increased pulmonary pressure.  She presents for follow-up evaluation.  Past Medical History:  Diagnosis Date  . Arthritis   . Chronic kidney disease    KIDNEY STONES  . Dementia    BEGINNING STAGES   . Depression   . GERD (gastroesophageal reflux disease)   . Glaucoma   . Hiatal hernia   . HTN (hypertension) 10/18/2011  . Hyperlipemia 10/18/2011  . NSVT (nonsustained ventricular tachycardia) ([place]) 10/18/2011  . Osteoporosis   . PAD (peripheral artery disease) ([place]) 10/18/2011   h/o left SFA stent    Past Surgical History:  Procedure Laterality Date  . ABDOMINAL HYSTERECTOMY  1973  . CARDIAC CATHETERIZATION     2012  . CHOLECYSTECTOMY    . LOOP RECORDER IMPLANT N/A 08/27/2014   Procedure: LOOP RECORDER IMPLANT;  Surgeon: Coralyn Mark, MD;  Location: [facility];  Service: Cardiovascular;  Laterality: N/A;  . LOWER EXTREMITY ANGIOGRAM  11/02/2007   stent of mid left SFA with 6x163m  EV3 self-expanding stent and 6x3 in prox region (Dr. Adora Fridge)  . LOWER EXTREMITY ANGIOGRAM    . LOWER EXTREMITY ARTERIAL DOPPLER  2013   right SFA with 50-69% diameter reduction, right PTA/peroneal occluded, L SFA prox to stent has narrowing with increased velocities >60% diameter reduction, L SFA stent patent, L ATA w/occlusive disease, bilat ABIs show mild arterial insuffiency at rest  . NM MYOCAR PERF WALL MOTION  10/2011   non-gated - normal stuy, EF 82%, normal LV wall motion  . REVERSE SHOULDER ARTHROPLASTY Right 01/26/2013   Procedure: RIGHT REVERSE TOTAL SHOULDER ARTHROPLASTY;  Surgeon: Augustin Schooling, MD;  Location: [facility];  Service: Orthopedics;  Laterality: Right;  . TRANSTHORACIC ECHOCARDIOGRAM  10/2012   EF 75-70%, mod conc hypertrophy, severely calcified MV annulus, LA mildly dailted, PA peak pressure 50mHg    Allergies  Allergen Reactions  . Crestor [Rosuvastatin Calcium]     Muscle pain  .  Lipitor [Atorvastatin Calcium]     Muscle pain    Current Outpatient Prescriptions  Medication Sig Dispense Refill  . amLODipine (NORVASC) 5 MG tablet Take 1.5 tablets (7.5 mg total) by mouth at bedtime. 135 tablet 2  . aspirin EC 81 MG tablet Take 81 mg by mouth every evening.     . citalopram (CELEXA) 10 MG tablet TAKE 1 TABLET (10 MG TOTAL) BY MOUTH DAILY. 90 tablet 3  . clopidogrel (PLAVIX) 75 MG tablet Take 1 tablet (75 mg total) by mouth daily. 90 tablet 3  . ezetimibe (ZETIA) 10 MG tablet Take 1 tablet (10 mg total) by mouth daily. 90 tablet 3  . feeding supplement, ENSURE COMPLETE, (ENSURE COMPLETE) LIQD Take 237 mLs by mouth 3 (three) times daily between meals.    .Marland Kitchenlosartan (COZAAR) 100 MG tablet Take 1 tablet (100 mg total) by mouth daily. 90 tablet 3  . metoprolol (LOPRESSOR) 50 MG tablet Take 100 mg in the morning and 75 in the evening 270 tablet 3  . Multiple Vitamins-Minerals (SENIOR MULTIVITAMIN PLUS PO) Take 1 tablet by mouth daily.    .Marland Kitchenomega-3 acid ethyl esters (LOVAZA) 1 g capsule Take 1 capsule (1 g total) by mouth every evening.    . pantoprazole (PROTONIX) 40 MG tablet Take 1 tablet (40 mg total) by mouth 2 (two) times daily. (Patient taking differently: Take 40 mg by mouth every morning. ) 180 tablet 3  . Pitavastatin Calcium (LIVALO) 2 MG TABS Take 1 tablet (2 mg total) by mouth daily. (Patient taking differently: Take 1 tablet by mouth every evening. ) 90 tablet 3  . timolol (TIMOPTIC) 0.5 % ophthalmic solution Place 1 drop into both eyes 2 (two) times daily.      No current facility-administered medications for this visit.     Social History   Social History  . Marital status: Widowed    Spouse name: N/A  . Number of children: 4  . Years of education: N/A   Occupational History  . Not on file.   Social History Main Topics  . Smoking status: Former Smoker    Quit date: 05/07/1984  . Smokeless tobacco: Never Used  . Alcohol use No  . Drug use: No  .  Sexual activity: Not Currently   Other Topics Concern  . Not on file   Social History Narrative  . No narrative on file    Family History  Problem Relation Age of Onset  . Throat cancer Father   . Cancer Mother   . CAD  Son   . Breast cancer Sister     cervical cancer  . Colon cancer Brother     throat cancer  . Pulmonary embolism Daughter   . 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 Redmond Baseman Pulmonary: Negative; No cough, wheezing, shortness of breath, hemoptysis Cardiovascular: See HPI:  Positive for claudication GI: Positive for GERD; No nausea, vomiting, diarrhea, or abdominal pain GU: Negative; No dysuria, hematuria, or difficulty voiding Musculoskeletal: Negative; no myalgias, joint pain, or weakness Hematologic: Negative; no easy bruising, bleeding Endocrine: Negative; no heat/cold intolerance; no diabetes, Neuro: Negative; no changes in balance, headaches; mild dementia Skin: Negative; No rashes or skin lesions Psychiatric: Negative; No behavioral problems, depression Sleep: Positive for daytime sleepiness, hypersomnolence, no bruxism, restless legs, hypnogognic hallucinations. Other comprehensive 14 point system review is negative   Physical Exam BP (!) 148/80 (BP Location: Left Arm, Patient Position: Sitting, Cuff Size: Normal)   Pulse (!) 56   Ht 5' 5"  (1.651 m)   Wt 128 lb 4 oz (58.2 kg)   BMI 21.34 kg/m    Repeat blood pressure by me was 162/80 supine and 168/82, standing.  Wt Readings from Last 3 Encounters:  10/14/16 128 lb 4 oz (58.2 kg)  09/02/16 127 lb (57.6 kg)  06/29/16 126 lb (57.2 kg)   General: Alert, oriented, no distress.  Skin: normal turgor, no rashes, warm and dry HEENT: Normocephalic, atraumatic. Pupils equal round and  reactive to light; sclera anicteric; extraocular muscles intact, No lid lag; Nose without nasal septal hypertrophy; Mouth/Parynx benign; Mallinpatti scale 2 Neck: No JVD, no carotid bruits; normal carotid upstroke; positive for palpable left neck mass which is consistent with Warthin's tumor documented on biopsy. Lungs: clear to ausculatation and percussion bilaterally; no wheezing or rales, normal inspiratory and expiratory effort Chest wall: without tenderness to palpitation Heart: PMI not displaced, RRR, s1 s2 normal, 2/6 systolic murmur left sternal border, No diastolic murmur, no rubs, gallops, thrills, or heaves Abdomen: soft, nontender; no hepatosplenomehaly, BS+; abdominal aorta nontender and not dilated by palpation. Back: no CVA tenderness Pulses: 2+ upper extremity.  1+ distally  Musculoskeletal: full range of motion, normal strength, no joint deformities Extremities: Pulses 2+, no clubbing cyanosis or edema, Homan's sign negative  Neurologic: grossly nonfocal; Cranial nerves grossly wnl Psychologic: Normal mood and affect  ECG (independently read by me), with rhythm strip: Sinus bradycardia 57 bpm.  First-degree AV block with a PR interval at 256 ms.  Rare PVC with VA conduction.  April 2016 ECG (independently read by me): Sinus bradycardia 55 bpm.  First-degree AV block with a PR interval at 246 ms.  Nonspecific ST-T changes.  November 2015 ECG (independently read by me): sinus bradycardia at 59 bpm with first-degree AV block.  LVH with repolarization changes.  ECG (independently read by me) normal sinus rhythm at 66 beats per minute.  First degree AV block with a PR interval at 236 ms.  June 2015 ECG (independently read by me): Sinus bradycardia 59 beats per minute.  First degree AV block with PR interval 240 ms.  Nonspecific ST-T changes.  LABS: BMP Latest Ref Rng & Units 06/29/2016 06/17/2016 06/16/2016  Glucose 65 - 99 mg/dL 154(H) 100(H) -  BUN 7 - 25 mg/dL 12 11 -  Creatinine  0.60 - 0.88 mg/dL 1.11(H)  0.94 0.87  Sodium 135 - 146 mmol/L 138 138 -  Potassium 3.5 - 5.3 mmol/L 4.6 4.7 -  Chloride 98 - 110 mmol/L 106 110 -  CO2 20 - 31 mmol/L 22 24 -  Calcium 8.6 - 10.4 mg/dL 9.6 9.4 -   Hepatic Function Latest Ref Rng & Units 06/29/2016 03/14/2015 08/23/2014  Total Protein 6.1 - 8.1 g/dL 6.5 6.8 7.5  Albumin 3.6 - 5.1 g/dL 3.8 4.3 4.0  AST 10 - 35 U/L 19 18 28   ALT 6 - 29 U/L 15 20 14   Alk Phosphatase 33 - 130 U/L 40 43 45  Total Bilirubin 0.2 - 1.2 mg/dL 0.9 0.9 0.9   CBC Latest Ref Rng & Units 06/29/2016 06/17/2016 06/16/2016  WBC 4.0 - 10.5 K/uL 5.8 6.8 6.1  Hemoglobin 11.7 - 15.5 g/dL 14.1 13.3 14.0  Hematocrit 35.0 - 45.0 % 43.1 41.0 43.5  Platelets 140 - 400 K/uL 225 183 166   Lab Results  Component Value Date   TSH 2.203 05/15/2014   Lipid Panel     Component Value Date/Time   CHOL 190 03/14/2015 1034   TRIG 100 03/14/2015 1034   HDL 87 03/14/2015 1034   CHOLHDL 2.2 03/14/2015 1034   VLDL 20 03/14/2015 1034   LDLCALC 83 03/14/2015 1034     RADIOLOGY: Mm Digital Screening Bilateral  04/25/2014   CLINICAL DATA:  Screening.  EXAM: DIGITAL SCREENING BILATERAL MAMMOGRAM WITH CAD  COMPARISON:  02/17/2012, 09/22/2010.  ACR Breast Density Category b: There are scattered areas of fibroglandular density.  FINDINGS: In the right breast, skin thickening warrants further evaluation with physical examination and possibly ultrasound. In the left breast, no findings suspicious for malignancy. Images were processed with CAD.  IMPRESSION: Further evaluation is suggested for possible skin thickening in the right breast.  RECOMMENDATION: Physical examination and possibly ultrasound of the right breast. (Code:FI-R-55M)  The patient will be contacted regarding the findings, and additional imaging will be scheduled.  BI-RADS CATEGORY  0: Incomplete. Need additional imaging evaluation and/or prior mammograms for comparison.   Electronically Signed   By: Luberta Robertson M.D.    On: 04/25/2014 07:41   US Breast Ltd Uni Right Inc Axilla  05/08/2014   CLINICAL DATA:  Screening callback for right breast skin thickening asymmetrically in the inferior breast. The patient denies a history of congestive heart failure. She had right shoulder surgery approximately 1 year ago but denies right arm asymmetric edema.  EXAM: ULTRASOUND OF THE right BREAST  COMPARISON:  Prior mammograms  FINDINGS: On physical exam, I palpate no abnormality in the left breast 6 o'clock/ inferior breast. There is no skin thickening, induration, or erythema.  Ultrasound is performed, showing minimal skin thickening in the right inferior breast measuring 3 mm as compared to the right breast 12 o'clock location measuring 2 mm in maximal thickness. No underlying intramammary abnormality is identified.  IMPRESSION: Minimal asymmetric prominence of the right inferior breast skin without underlying intramammary abnormality. There is no evidence for active infection or other dermatologic abnormality. This is of unclear clinical significance and clinical followup is recommended as part of the patient's routine healthcare maintenance.  RECOMMENDATION: Screening mammogram in one year.(Code:SM-B-01Y)  I have discussed the findings and recommendations with the patient. Results were also provided in writing at the conclusion of the visit. If applicable, a reminder letter will be sent to the patient regarding the next appointment.  BI-RADS CATEGORY  1: Negative.   Electronically Signed   By: Elzie Rings  Green M.D.   On: 05/08/2014 15:34       ASSESSMENT AND PLAN: Ms.Azam is an 82 year old female with a history of hypertension, moderate concentric left ventricular hypertrophy with LVOT gradient at the midcavity level of 10 mm at her previous echo evaluation in 2013 which was not detected on her subsequent echo evaluation.  She has severely calcified mitral annular calcification and mild pulmonary hypertension.  Reviewed her recent  loop monitor recording which revealed an episode of wide complex tachycardia with perhaps mild irregularity raising the possibility of supraventricular origin with aberrancy.  In the past.  She has been Dr. have personally nonsustained VT.  She is completely asymptomatic.  She now is on a slightly increased beta blocker regimen and is taking 100 mg in the morning and 75 mg at night.  Her blood pressure today is elevated and I've suggested slight titration of her amlodipine from 5 mg to 7.5 mg.  She is tolerating Livalo and Zetia combination for hyperlipidemia.  She is fasting today and will have blood work done after her office visit.  Adjustments to her medical regimen will be made if necessary.  I again reviewed her most recent echo Doppler study with her in detail.  She has a prominent node in the left neck in which previously had been biopsied and was consistent with a Warthin's tumor.  I will see her in 6 months for reevaluation or sooner if problems arise.  Troy Sine, MD, [facility]  10/14/2016 1:17 PM

## 2016-10-15 ENCOUNTER — Other Ambulatory Visit: Payer: Self-pay

## 2016-10-15 DIAGNOSIS — E875 Hyperkalemia: Secondary | ICD-10-CM

## 2016-10-15 LAB — LIPID PANEL
Cholesterol: 179 mg/dL (ref <200)
HDL: 85 mg/dL (ref >50)
LDL Cholesterol: 69 mg/dL (ref <100)
Total CHOL/HDL Ratio: 2.1 Ratio (ref <5.0)
Triglycerides: 123 mg/dL (ref <150)
VLDL: 25 mg/dL (ref <30)

## 2016-10-15 LAB — TSH: TSH: 2.67 mIU/L

## 2016-10-15 LAB — COMPREHENSIVE METABOLIC PANEL
ALT: 13 U/L (ref 6–29)
AST: 23 U/L (ref 10–35)
Albumin: 4.3 g/dL (ref 3.6–5.1)
Alkaline Phosphatase: 35 U/L (ref 33–130)
BUN: 14 mg/dL (ref 7–25)
CO2: 23 mmol/L (ref 20–31)
Calcium: 10 mg/dL (ref 8.6–10.4)
Chloride: 108 mmol/L (ref 98–110)
Creat: 1.11 mg/dL — ABNORMAL HIGH (ref 0.60–0.88)
Glucose, Bld: 93 mg/dL (ref 65–99)
Potassium: 5.9 mmol/L — ABNORMAL HIGH (ref 3.5–5.3)
Sodium: 143 mmol/L (ref 135–146)
Total Bilirubin: 1 mg/dL (ref 0.2–1.2)
Total Protein: 6.9 g/dL (ref 6.1–8.1)

## 2016-10-18 ENCOUNTER — Ambulatory Visit (INDEPENDENT_AMBULATORY_CARE_PROVIDER_SITE_OTHER): Payer: Medicare Other | Admitting: *Deleted

## 2016-10-18 DIAGNOSIS — H401123 Primary open-angle glaucoma, left eye, severe stage: Secondary | ICD-10-CM | POA: Diagnosis not present

## 2016-10-18 DIAGNOSIS — I472 Ventricular tachycardia, unspecified: Secondary | ICD-10-CM

## 2016-10-18 DIAGNOSIS — E875 Hyperkalemia: Secondary | ICD-10-CM | POA: Diagnosis not present

## 2016-10-18 DIAGNOSIS — H401113 Primary open-angle glaucoma, right eye, severe stage: Secondary | ICD-10-CM | POA: Diagnosis not present

## 2016-10-18 NOTE — Progress Notes (Signed)
Carelink Summary Report / Loop Recorder 

## 2016-10-19 LAB — BASIC METABOLIC PANEL
BUN: 15 mg/dL (ref 7–25)
CO2: 30 mmol/L (ref 20–31)
Calcium: 9.5 mg/dL (ref 8.6–10.4)
Chloride: 105 mmol/L (ref 98–110)
Creat: 1.14 mg/dL — ABNORMAL HIGH (ref 0.60–0.88)
Glucose, Bld: 103 mg/dL — ABNORMAL HIGH (ref 65–99)
Potassium: 4.7 mmol/L (ref 3.5–5.3)
Sodium: 141 mmol/L (ref 135–146)

## 2016-10-23 LAB — CUP PACEART REMOTE DEVICE CHECK
Date Time Interrogation Session: 20171102113548
Implantable Pulse Generator Implant Date: 20151013

## 2016-10-23 NOTE — Progress Notes (Signed)
Carelink summary report received. Battery status OK. Normal device function. No new symptom episodes, brady, or pause episodes. No new AF episodes. 1 tachy- ECG appears wide complex tachycardia ?NSVT X 33 beats, previously addressed, labs and echo ordered per [facility], EF 55-60% per Dr Evette Georges note. Monthly summary reports and ROV/PRN

## 2016-11-16 ENCOUNTER — Ambulatory Visit (INDEPENDENT_AMBULATORY_CARE_PROVIDER_SITE_OTHER): Payer: Medicare Other | Admitting: *Deleted

## 2016-11-16 DIAGNOSIS — R55 Syncope and collapse: Secondary | ICD-10-CM

## 2016-11-18 NOTE — Progress Notes (Signed)
Carelink Summary Report / Loop Recorder 

## 2016-11-25 ENCOUNTER — Encounter: Payer: Self-pay | Admitting: Family Medicine

## 2016-11-25 ENCOUNTER — Ambulatory Visit (INDEPENDENT_AMBULATORY_CARE_PROVIDER_SITE_OTHER): Payer: Medicare Other | Admitting: Family Medicine

## 2016-11-25 VITALS — BP 106/62 | HR 52 | Temp 98.0°F | Wt 127.0 lb

## 2016-11-25 DIAGNOSIS — N3000 Acute cystitis without hematuria: Secondary | ICD-10-CM | POA: Diagnosis not present

## 2016-11-25 LAB — CUP PACEART REMOTE DEVICE CHECK
Date Time Interrogation Session: 20171202123918
Implantable Pulse Generator Implant Date: 20151013

## 2016-11-25 LAB — POCT URINALYSIS DIPSTICK
Bilirubin, UA: NEGATIVE
Blood, UA: NEGATIVE
Glucose, UA: NEGATIVE
Ketones, UA: NEGATIVE
Nitrite, UA: NEGATIVE
Protein, UA: NEGATIVE
Spec Grav, UA: 1.025
Urobilinogen, UA: NEGATIVE
pH, UA: 6

## 2016-11-25 MED ORDER — SULFAMETHOXAZOLE-TRIMETHOPRIM 800-160 MG PO TABS: 1.0000 | ORAL_TABLET | Freq: Two times a day (BID) | ORAL | 0 refills | Status: DC

## 2016-11-25 NOTE — Progress Notes (Signed)
   Subjective:    Patient ID: Julie Bass, female    DOB: June 23, 1926, 82 y.o.   MRN: [id number]  HPI She comes in with her daughter complaining of some back pain with urge incontinence, frequency and malaise but no fever, chills. She has had difficulty with UTIs in the past.   Review of Systems     Objective:   Physical Exam Alert and in no distress. Abdominal exam shows no tenderness to palpation. Urine microscopic was contaminated however the dipstick did show white cells.       Assessment & Plan:  Acute cystitis without hematuria - Plan: POCT Urinalysis Dipstick, sulfamethoxazole-trimethoprim (BACTRIM DS,SEPTRA DS) 800-160 MG tablet  I will treat this as a UTI although it is certainly not classic. If she continues have difficulty further evaluation including culture might need to be done.

## 2016-11-29 ENCOUNTER — Telehealth: Payer: Self-pay | Admitting: *Deleted

## 2016-11-29 NOTE — Telephone Encounter (Signed)
Per Dr. Curt Bears, episode suggests SVT with aberrancy, possibly A-fib.  No recommended changes urgently as duration was 13 sec.  Will update [employer]

## 2016-11-29 NOTE — Telephone Encounter (Signed)
LMOVM requesting call back to [facility] from patient's daughter, [name] ([place]).  Patient had 13 sec episode of wide complex tachycardia on LINQ from 11/27/16 at [facility].  Episode appears irregular, peak V rate 162bpm.  Per Dr. Evette Georges OV note from 10/14/16, previous episodes have suggested SVT with aberrancy vs NSVT.  Will determine if patient was symptomatic with episode.  Patient's daughter returned call.  Patient did not report any symptoms associated with this episode.  Will review with Dr. Curt Bears as Dr. Claiborne Billings and Dr. Sallyanne Kuster are both out of the office this week.  [name] is appreciative of call and denies additional questions or concerns at this time.

## 2016-12-05 IMAGING — CR DG CHEST 2V
2 series · 2 of 2 positions shown · non-contrast
Comparison: Radiographs 08/23/2014

CLINICAL DATA: Fever.

EXAM:
CHEST  2 VIEW

[chest lat]
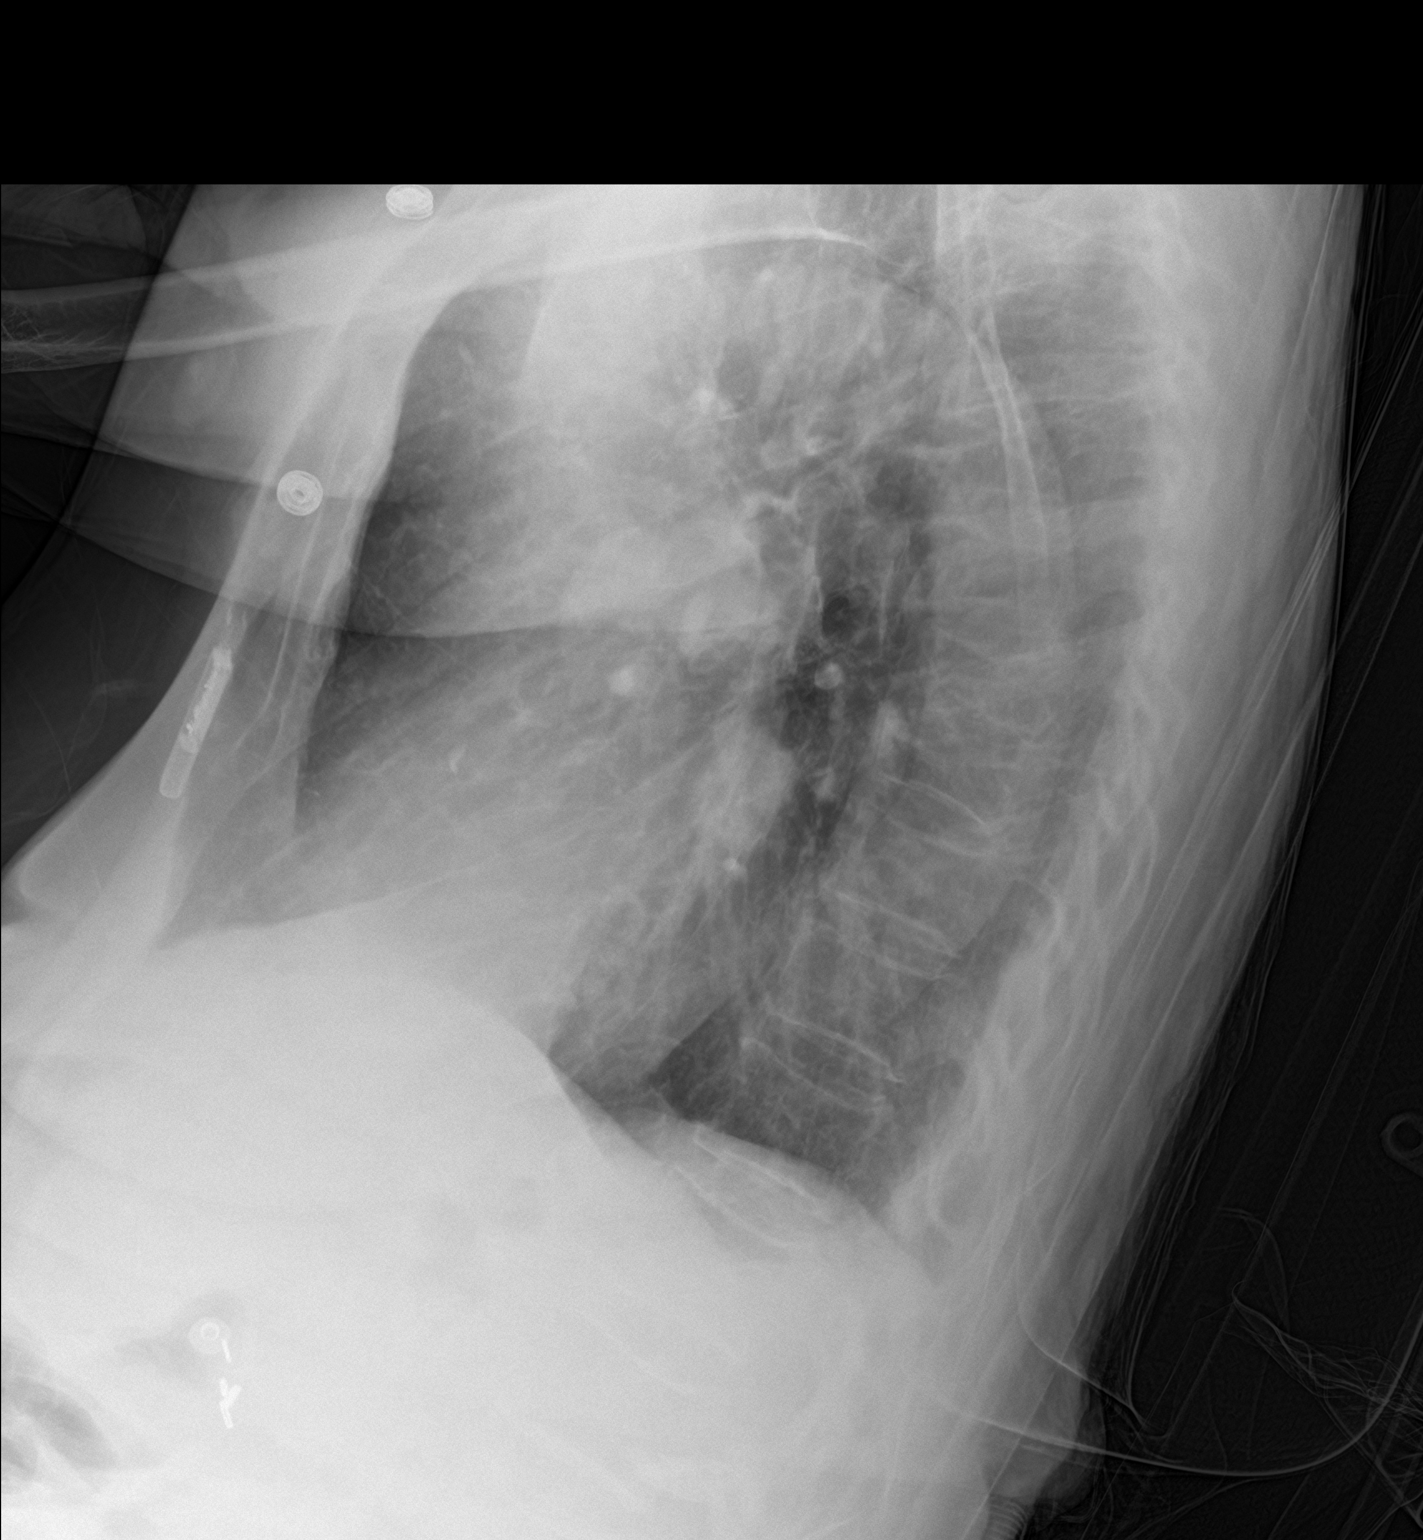

[chest ap]
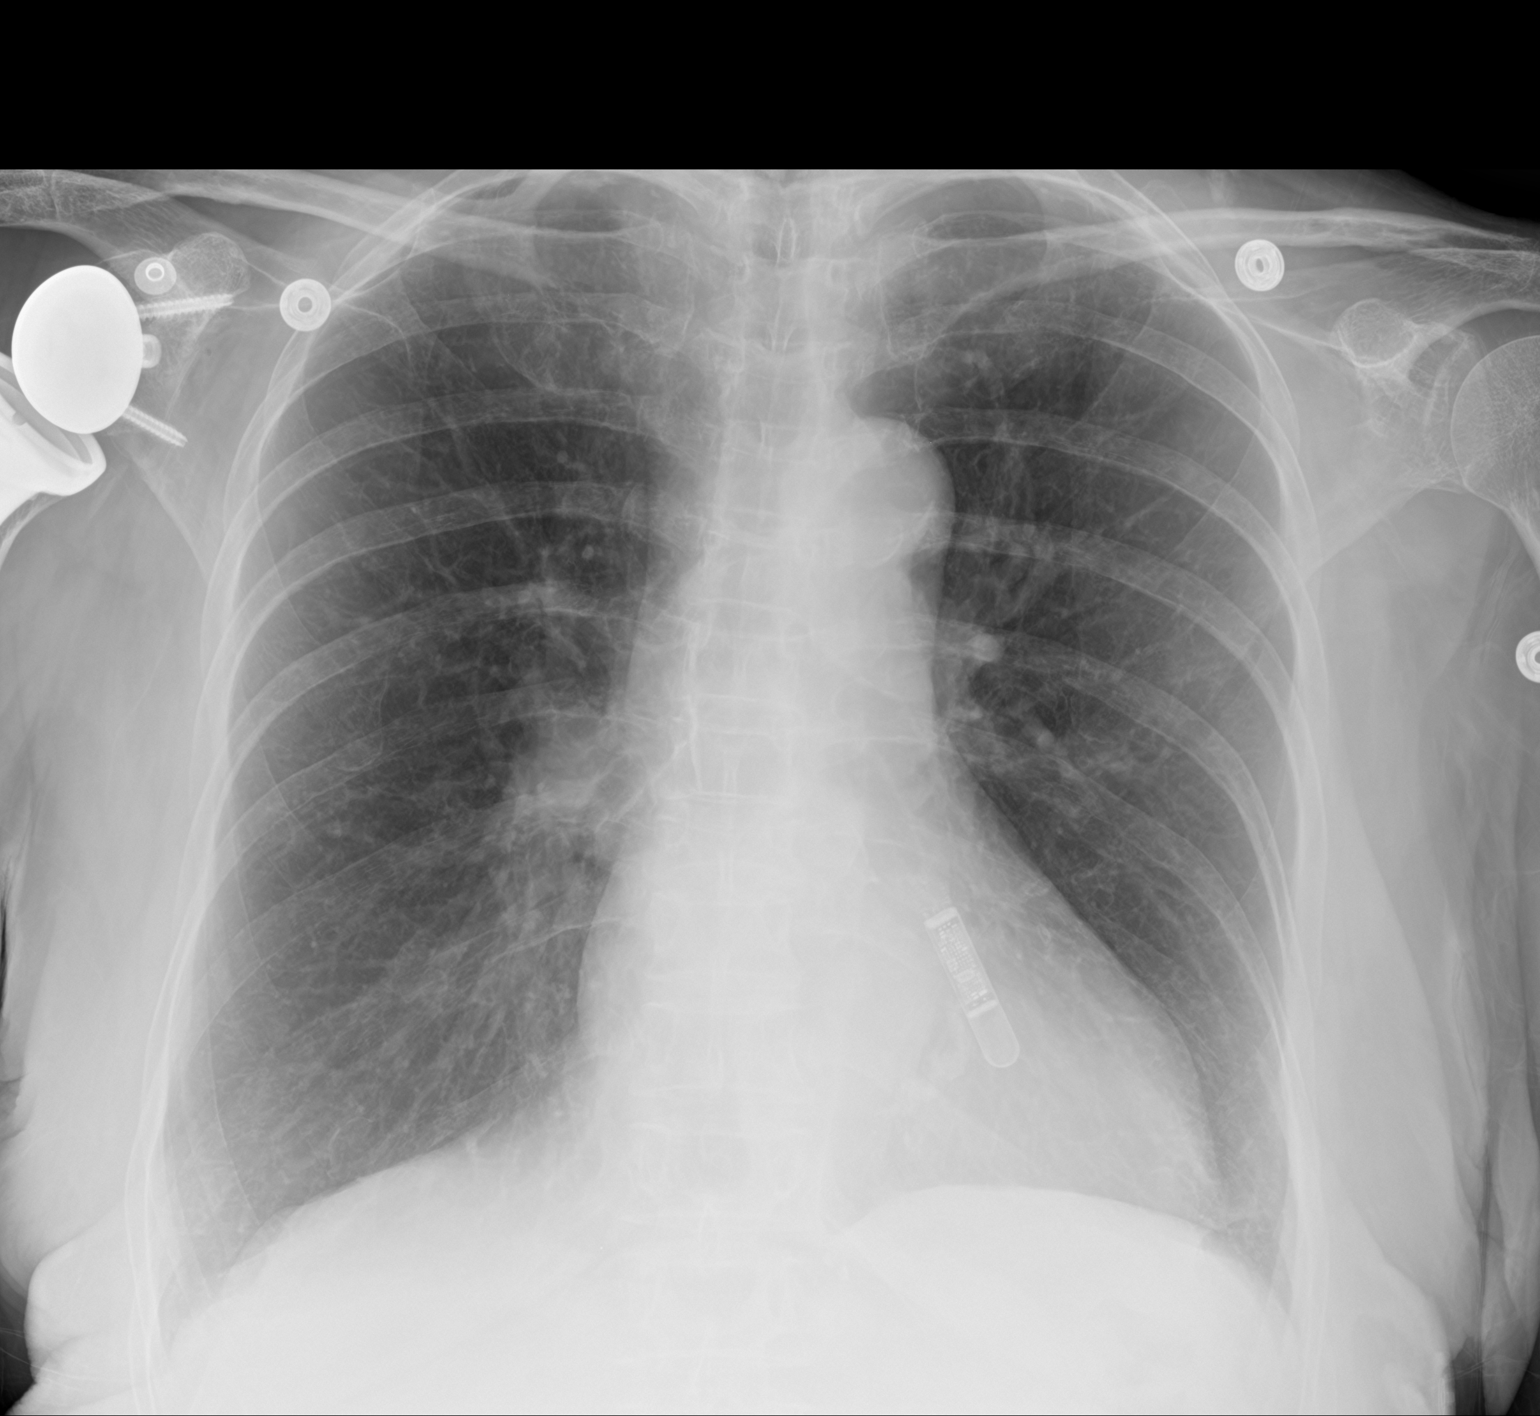

[2 of 2 positions shown; findings below may reference images not displayed]

FINDINGS: Unchanged mediastinal contours. Heart upper limits normal in size.
Tortuosity and atherosclerosis of the thoracic aorta. Implanted
monitoring device in the left anterior chest wall. Mild chronic
hyperinflation. Pulmonary vasculature is normal. No consolidation,
pleural effusion, or pneumothorax. Limited thoracic spine
assessment. Reverse right shoulder arthroplasty.
IMPRESSION: 1. Stable mild hyperinflation without acute process. No evidence of
pneumonia.
2. Aortic atherosclerosis.

## 2016-12-15 ENCOUNTER — Other Ambulatory Visit: Payer: Self-pay | Admitting: Cardiovascular Disease

## 2016-12-15 ENCOUNTER — Ambulatory Visit (INDEPENDENT_AMBULATORY_CARE_PROVIDER_SITE_OTHER): Payer: Medicare Other | Admitting: *Deleted

## 2016-12-15 DIAGNOSIS — R55 Syncope and collapse: Secondary | ICD-10-CM | POA: Diagnosis not present

## 2016-12-15 NOTE — Progress Notes (Signed)
Carelink Summary Report / Loop Recorder 

## 2016-12-31 ENCOUNTER — Other Ambulatory Visit: Payer: Self-pay

## 2016-12-31 MED ORDER — METOPROLOL TARTRATE 50 MG PO TABS: ORAL_TABLET | ORAL | 3 refills | Status: DC

## 2017-01-01 LAB — CUP PACEART REMOTE DEVICE CHECK
Date Time Interrogation Session: 20180101130919
Implantable Pulse Generator Implant Date: 20151013

## 2017-01-01 NOTE — Progress Notes (Signed)
Carelink summary report received. Battery status OK. Normal device function. No new symptom episodes, tachy episodes, or brady episodes. No new AF episodes. 1 pause- appears vent. undersensing.Monthly summary reports and ROV/PRN

## 2017-01-13 LAB — CUP PACEART REMOTE DEVICE CHECK
Date Time Interrogation Session: 20180131131040
Implantable Pulse Generator Implant Date: 20151013

## 2017-01-14 ENCOUNTER — Ambulatory Visit (INDEPENDENT_AMBULATORY_CARE_PROVIDER_SITE_OTHER): Payer: Medicare Other | Admitting: *Deleted

## 2017-01-14 DIAGNOSIS — R55 Syncope and collapse: Secondary | ICD-10-CM

## 2017-01-17 ENCOUNTER — Encounter: Payer: Self-pay | Admitting: Cardiovascular Disease

## 2017-01-17 ENCOUNTER — Other Ambulatory Visit: Payer: Self-pay | Admitting: Cardiovascular Disease

## 2017-01-17 NOTE — Progress Notes (Signed)
Carelink Summary Report / Loop Recorder 

## 2017-02-02 LAB — CUP PACEART REMOTE DEVICE CHECK
Date Time Interrogation Session: 20180302131423
Implantable Pulse Generator Implant Date: 20151013

## 2017-02-14 ENCOUNTER — Ambulatory Visit (INDEPENDENT_AMBULATORY_CARE_PROVIDER_SITE_OTHER): Payer: Medicare Other | Admitting: *Deleted

## 2017-02-14 DIAGNOSIS — R55 Syncope and collapse: Secondary | ICD-10-CM | POA: Diagnosis not present

## 2017-02-14 NOTE — Progress Notes (Signed)
Carelink Summary Report / Loop Recorder 

## 2017-02-17 LAB — CUP PACEART REMOTE DEVICE CHECK
Date Time Interrogation Session: 20180401133817
Implantable Pulse Generator Implant Date: 20151013

## 2017-03-15 ENCOUNTER — Ambulatory Visit (INDEPENDENT_AMBULATORY_CARE_PROVIDER_SITE_OTHER): Payer: Medicare Other | Admitting: *Deleted

## 2017-03-15 DIAGNOSIS — R55 Syncope and collapse: Secondary | ICD-10-CM

## 2017-03-15 NOTE — Progress Notes (Signed)
Carelink Summary Report / Loop Recorder 

## 2017-03-27 LAB — CUP PACEART REMOTE DEVICE CHECK
Date Time Interrogation Session: 20180501140859
Implantable Pulse Generator Implant Date: 20151013

## 2017-03-27 NOTE — Progress Notes (Signed)
Carelink summary report received. Battery status OK. Normal device function. No new symptom episodes, tachy episodes, or brady episodes. No new AF episodes. 1 pause- false, undersensed PVCs noted. Monthly summary reports and ROV/PRN

## 2017-03-29 ENCOUNTER — Telehealth: Payer: Self-pay | Admitting: Family Medicine

## 2017-03-29 NOTE — Telephone Encounter (Signed)
Called for the second time  and her daughter [name] will call back and schedule when she can get her in for a medicare well visit

## 2017-04-05 ENCOUNTER — Other Ambulatory Visit: Payer: Self-pay

## 2017-04-05 ENCOUNTER — Ambulatory Visit: Payer: Medicare Other | Admitting: Cardiovascular Disease

## 2017-04-05 DIAGNOSIS — E782 Mixed hyperlipidemia: Secondary | ICD-10-CM

## 2017-04-05 DIAGNOSIS — I10 Essential (primary) hypertension: Secondary | ICD-10-CM | POA: Diagnosis not present

## 2017-04-05 DIAGNOSIS — Z79899 Other long term (current) drug therapy: Secondary | ICD-10-CM

## 2017-04-05 DIAGNOSIS — R5383 Other fatigue: Secondary | ICD-10-CM | POA: Diagnosis not present

## 2017-04-05 LAB — CBC
HCT: 45.7 % — ABNORMAL HIGH (ref 35.0–45.0)
Hemoglobin: 14.9 g/dL (ref 11.7–15.5)
MCH: 31.2 pg (ref 27.0–33.0)
MCHC: 32.6 g/dL (ref 32.0–36.0)
MCV: 95.8 fL (ref 80.0–100.0)
MPV: 10.5 fL (ref 7.5–12.5)
Platelets: 174 10*3/uL (ref 140–400)
RBC: 4.77 MIL/uL (ref 3.80–5.10)
RDW: 13.6 % (ref 11.0–15.0)
WBC: 5.4 10*3/uL (ref 3.8–10.8)

## 2017-04-06 LAB — COMPLETE METABOLIC PANEL WITH GFR
ALT: 13 U/L (ref 6–29)
AST: 14 U/L (ref 10–35)
Albumin: 4.4 g/dL (ref 3.6–5.1)
Alkaline Phosphatase: 43 U/L (ref 33–130)
BUN: 18 mg/dL (ref 7–25)
CO2: 21 mmol/L (ref 20–31)
Calcium: 9.7 mg/dL (ref 8.6–10.4)
Chloride: 107 mmol/L (ref 98–110)
Creat: 1.24 mg/dL — ABNORMAL HIGH (ref 0.60–0.88)
GFR, Est African American: 45 mL/min — ABNORMAL LOW (ref >=60)
GFR, Est Non African American: 39 mL/min — ABNORMAL LOW (ref >=60)
Glucose, Bld: 89 mg/dL (ref 65–99)
Potassium: 4 mmol/L (ref 3.5–5.3)
Sodium: 143 mmol/L (ref 135–146)
Total Bilirubin: 0.8 mg/dL (ref 0.2–1.2)
Total Protein: 6.9 g/dL (ref 6.1–8.1)

## 2017-04-06 LAB — LIPID PANEL
Cholesterol: 186 mg/dL (ref <200)
HDL: 79 mg/dL (ref >50)
LDL Cholesterol: 90 mg/dL (ref <100)
Total CHOL/HDL Ratio: 2.4 Ratio (ref <5.0)
Triglycerides: 85 mg/dL (ref <150)
VLDL: 17 mg/dL (ref <30)

## 2017-04-06 LAB — TSH: TSH: 2.33 mIU/L

## 2017-04-07 ENCOUNTER — Ambulatory Visit (INDEPENDENT_AMBULATORY_CARE_PROVIDER_SITE_OTHER): Payer: Medicare Other | Admitting: Cardiovascular Disease

## 2017-04-07 ENCOUNTER — Encounter: Payer: Self-pay | Admitting: Cardiovascular Disease

## 2017-04-07 VITALS — BP 130/66 | HR 52 | Ht 61.0 in | Wt 127.0 lb

## 2017-04-07 DIAGNOSIS — G300 Alzheimer's disease with early onset: Secondary | ICD-10-CM

## 2017-04-07 DIAGNOSIS — I472 Ventricular tachycardia: Secondary | ICD-10-CM | POA: Diagnosis not present

## 2017-04-07 DIAGNOSIS — R5383 Other fatigue: Secondary | ICD-10-CM | POA: Diagnosis not present

## 2017-04-07 DIAGNOSIS — F0280 Dementia in other diseases classified elsewhere without behavioral disturbance: Secondary | ICD-10-CM

## 2017-04-07 DIAGNOSIS — I119 Hypertensive heart disease without heart failure: Secondary | ICD-10-CM

## 2017-04-07 DIAGNOSIS — I4729 Other ventricular tachycardia: Secondary | ICD-10-CM

## 2017-04-07 DIAGNOSIS — I10 Essential (primary) hypertension: Secondary | ICD-10-CM | POA: Diagnosis not present

## 2017-04-07 DIAGNOSIS — E7800 Pure hypercholesterolemia, unspecified: Secondary | ICD-10-CM

## 2017-04-07 MED ORDER — METOPROLOL TARTRATE 50 MG PO TABS: ORAL_TABLET | ORAL | 3 refills | Status: DC

## 2017-04-07 NOTE — Progress Notes (Signed)
Patient ID: Julie Bass, female   DOB: [birth year], 83 y.o.   MRN: [id number]    PCP: Dr. Jill Alexanders  HPI: Julie Bass is a 83 y.o. female who presents to the office today for a 6 month follow-up cardiology evaluation.  [name] has a history of hypertension, hyperlipidemia, peripheral vascular disease, as well as episodes of nonsustained ventricular tachycardia, for, which she's been treated with beta blocker therapy.  An echo Doppler study in December 2013 showed hyperdynamic LV function with an ejection fraction of at least 76%, grade 1 diastolic dysfunction, and dynamic obstruction in the mid cavity of the left ventricle with a gradient of 10 mm.  She had severely calcified mitral annulus and mild left atrial dilatation.  She has documented nonsustained ventricular tachycardia, which was picked up on a remote event monitor, and she has tolerated titration of her beta blocker therapy.  She also has had difficulty with statin intolerance for hyperlipidemia, but has tolerated livalo 2 mg daily.  She also has history of hypertension and has been on losartan 100 mg in addition to her metoprolol.  She has peripheral vascular disease and is status post stenting of her left SFA in the past.  She has one vessel runoff bilaterally and has moderate right SFA disease.  Her last LE Doppler study was in August 2013 , which showed occluded right PTA and peroneal occluded left PTA, left ATA with reconstitution of the dorsalis pedis artery.  ABIs were 0.88 on the right and 0.86 on the left.  She has been on aspirin 81 mg, and Plavix 75 mg   A 2 year followup echo Doppler study on 05/15/2014 showed an ejection fraction of 65-70% and there was grade 1 diastolic dysfunction.  There was moderate mitral annular calcification.  There was no mention of any dynamic obstruction.  Lower extremity Doppler studies revealed a patent left SFA stent with prior disease noted in several vessels, not significantly changed  from 2013.  She was hospitalized on 08/23/2014 after developing a syncopal spell.  She was not significantly orthostatic to her hospitalization.  Upon presentation her blood pressure was 170/58.  Her ECG showed sinus rhythm with mild ST changes laterally, and first degree heart block.  She was noted to have a single 15 beat run of nonsustained VT.  She ultimately underwent implantation of a loop recorder on 08/27/2014.  She underwent carotid duplex imaging and findings suggest 1-39% internal carotid artery stenosis bilaterally. The left vertebral artery is patent with antegrade flow but the the right vertebral artery was not able to be visualized.  After discharge, the loop recorder detected 2 episodes of questionable transient asystole.  Upon review of these tracings with Dr. Sallyanne Kuster it appears that these may very well be artifactual.  One episode was recorded on October 14, and another on October 16.  The episode in October 16 tach, rate of less than over 30 seconds.  The patient was with her daughter, who is my nurse at the time.  She denies any symptoms at this time.  Specifically, she denied any lightheadedness, and one would expect that with a >30 second pause she would have been significantly symptomatic and this would have also been noted by her daughter.  She denies any major episodes of dizziness but if she gets up quickly or lies down fast.  A CT of her abdomen showed atherosclerotic plaque throughout her abdominal aorta with suggestion of a tiny 0.5 x 0.2 cm atherosclerotic ulcer.  She was also noted to have plaque that was irregular throughout the right superficial femoral artery and also plaque in the popliteal artery, not resulting in hemodynamically significance stenosis.  She had single-vessel runoff to the right lower leg and foot.  The right anterior tibial artery with atretic reconstitution of both posterior tibial and peroneal vascular distributions.  The right dorsalis pedis artery was  patent to the level of the forefoot.  She had stents noted in the distal aspect of her left common femoral artery and single vessel runoff to the left lower leg and foot.  There was occlusion of both the left anterior, posterior tibial arteries.  The peroneal artery was found to reconstitute a dorsalis pedis artery at the level of the hindfoot.  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    She has underone neurologic evaluation with Dr. Corwin Levins and is felt to have moderate  dementia felt to be Alzheimer's  versus vascular dementia.  A CT scan of her head revealed mild cortical atrophy and moderately advanced small vessel ischemic changes involving the periventricular white matter with remote infarction in the right frontal lobe.   In October 2017, she was found to have an episode of irregular nonsustained wide complex tachycardia with a maximum rate at 167 bpm and ultimately reverted back to sinus rhythm.  She was completely asymptomatic and this occurred while she was in bed.  At that time, her metoprolol dose, which had been 75 Mill grams twice a day was increased to 100 mg in the morning and 75 mg at night.  She has continued to take losartan 100 mg daily as well as amlodipine 5 mg for blood pressure control.  She has felt well on the increased beta blocker regimen.  She has a history of hyperlipidemia and has been able to tolerate Livalo but could not tolerate other statins.  Her current Livalo dose is  mg daily and she also takes Zetia 10 mg.  She is on gabapentin for neuropathy.  She also is on Celexa.  She underwent an echo Doppler study yesterday, 10/13/2016 which showed an EF of 55-60%.  There was mild LVH.  There was grade 2 diastolic dysfunction with abnormal tissue Doppler suggesting high ventricular filling pressures.  Her aortic valve mobility was restricted but she did not have stenosis.  There was  mitral annular calcification.  There is mild left atrial dilatation.  There was mild increased pulmonary pressure.    Since I last saw her, she has had issues with short-term memory loss.  She denies chest pain or shortness of breath.  She notes fatigue and sleeps a lot.  She had never been tested for sleep apnea.  She is unaware of any episodes of tachycardia.  She denies presyncope or syncope.  Her most recent loop recorder assessment from 01/15/2017 through 03/15/2017 did not show any episodes of atrial fibrillation, tachycardia, bradycardia, pauses.  Laboratory was done 2 days ago.  TSH 2.33.  Hemoglobin 14.9, hematocrit 45.7.  BUN 18, creatinine 1.24.  LFTs were normal.  She is on Livalo and cholesterol 186, HDL 79, triglycerides 85, and LDL 90.  She presents for evaluation.  Past Medical History:  Diagnosis Date  . Arthritis   . Chronic kidney disease    KIDNEY STONES  . Dementia    BEGINNING STAGES   . Depression   . GERD (gastroesophageal reflux disease)   . Glaucoma   .  Hiatal hernia   . HTN (hypertension) 10/18/2011  . Hyperlipemia 10/18/2011  . NSVT (nonsustained ventricular tachycardia) ([place]) 10/18/2011  . Osteoporosis   . PAD (peripheral artery disease) ([place]) 10/18/2011   h/o left SFA stent    Past Surgical History:  Procedure Laterality Date  . ABDOMINAL HYSTERECTOMY  1973  . CARDIAC CATHETERIZATION     2012  . CHOLECYSTECTOMY    . LOOP RECORDER IMPLANT N/A 08/27/2014   Procedure: LOOP RECORDER IMPLANT;  Surgeon: Coralyn Mark, MD;  Location: [facility];  Service: Cardiovascular;  Laterality: N/A;  . LOWER EXTREMITY ANGIOGRAM  11/02/2007   stent of mid left SFA with 6x139m EV3 self-expanding stent and 6x3 in prox region (Dr. JAdora Fridge  . LOWER EXTREMITY ANGIOGRAM    . LOWER EXTREMITY ARTERIAL DOPPLER  2013   right SFA with 50-69% diameter reduction, right PTA/peroneal occluded, L SFA prox to stent has narrowing with increased velocities >60% diameter reduction, L SFA  stent patent, L ATA w/occlusive disease, bilat ABIs show mild arterial insuffiency at rest  . NM MYOCAR PERF WALL MOTION  10/2011   non-gated - normal stuy, EF 82%, normal LV wall motion  . REVERSE SHOULDER ARTHROPLASTY Right 01/26/2013   Procedure: RIGHT REVERSE TOTAL SHOULDER ARTHROPLASTY;  Surgeon: SAugustin Schooling MD;  Location: [facility]  Service: Orthopedics;  Laterality: Right;  . TRANSTHORACIC ECHOCARDIOGRAM  10/2012   EF 75-70%, mod conc hypertrophy, severely calcified MV annulus, LA mildly dailted, PA peak pressure 339mg    Allergies  Allergen Reactions  . Crestor [Rosuvastatin Calcium]     Muscle pain  . Lipitor [Atorvastatin Calcium]     Muscle pain    Current Outpatient Prescriptions  Medication Sig Dispense Refill  . amLODipine (NORVASC) 5 MG tablet Take 1.5 tablets (7.5 mg total) by mouth at bedtime. 135 tablet 2  . aspirin EC 81 MG tablet Take 81 mg by mouth every evening.     . citalopram (CELEXA) 10 MG tablet TAKE 1 TABLET (10 MG TOTAL) BY MOUTH DAILY. 90 tablet 3  . clopidogrel (PLAVIX) 75 MG tablet Take 1 tablet (75 mg total) by mouth daily. 90 tablet 3  . ezetimibe (ZETIA) 10 MG tablet Take 1 tablet (10 mg total) by mouth daily. 90 tablet 3  . feeding supplement, ENSURE COMPLETE, (ENSURE COMPLETE) LIQD Take 237 mLs by mouth 3 (three) times daily between meals.    . Marland Kitchenosartan (COZAAR) 100 MG tablet Take 1 tablet (100 mg total) by mouth daily. 90 tablet 3  . metoprolol tartrate (LOPRESSOR) 50 MG tablet Take 1.5 tablets twice a day 315 tablet 3  . Multiple Vitamins-Minerals (SENIOR MULTIVITAMIN PLUS PO) Take 1 tablet by mouth daily.    . Marland Kitchenmega-3 acid ethyl esters (LOVAZA) 1 g capsule Take 1 capsule (1 g total) by mouth every evening.    . pantoprazole (PROTONIX) 40 MG tablet Take 1 tablet (40 mg total) by mouth 2 (two) times daily. (Patient taking differently: Take 40 mg by mouth every morning. ) 180 tablet 3  . Pitavastatin Calcium (LIVALO) 2 MG TABS Take 1 tablet (2 mg  total) by mouth daily. (Patient taking differently: Take 1 tablet by mouth every evening. ) 90 tablet 3  . timolol (TIMOPTIC) 0.5 % ophthalmic solution Place 1 drop into both eyes 2 (two) times daily.      No current facility-administered medications for this visit.     Social History   Social History  . Marital status: Widowed  Spouse name: N/A  . Number of children: 4  . Years of education: N/A   Occupational History  . Not on file.   Social History Main Topics  . Smoking status: Former Smoker    Quit date: 05/07/1984  . Smokeless tobacco: Never Used  . Alcohol use No  . Drug use: No  . Sexual activity: Not Currently   Other Topics Concern  . Not on file   Social History Narrative  . No narrative on file    Family History  Problem Relation Age of Onset  . Throat cancer Father   . Cancer Mother   . CAD Son   . Breast cancer Sister        cervical cancer  . Colon cancer Brother        throat cancer  . Pulmonary embolism Daughter   . 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 Redmond Baseman Pulmonary: Negative; No cough, wheezing, shortness of breath, hemoptysis Cardiovascular: See HPI:  Positive for claudication GI: Positive for GERD; No nausea, vomiting, diarrhea, or abdominal pain GU: Negative; No dysuria, hematuria, or difficulty voiding Musculoskeletal: Negative; no myalgias, joint pain, or weakness Hematologic: Negative; no easy bruising, bleeding Endocrine: Negative; no heat/cold intolerance; no diabetes, Neuro: Negative; no changes in balance, headaches; mild dementia Skin: Negative; No rashes or skin lesions Psychiatric: Negative; No behavioral problems, depression Sleep: Positive for daytime sleepiness, hypersomnolence, no bruxism, restless  legs, hypnogognic hallucinations. Other comprehensive 14 point system review is negative   Physical Exam BP 130/66   Pulse (!) 52   Ht 5' 1"  (1.549 m)   Wt 127 lb (57.6 kg)   BMI 24.00 kg/m    Repeat blood pressure by me was 160/70  Wt Readings from Last 3 Encounters:  04/07/17 127 lb (57.6 kg)  11/25/16 127 lb (57.6 kg)  10/14/16 128 lb 4 oz (58.2 kg)      Physical Exam BP 130/66   Pulse (!) 52   Ht 5' 1"  (1.549 m)   Wt 127 lb (57.6 kg)   BMI 24.00 kg/m  General: Alert, oriented, no distress.  Skin: normal turgor, no rashes, warm and dry HEENT: Normocephalic, atraumatic. Pupils equal round and reactive to light; sclera anicteric; extraocular muscles intact;  Nose without nasal septal hypertrophy Mouth/Parynx benign; Mallinpatti scale 2 Neck: No JVD, no carotid bruits; normal carotid upstroke;  palpable left neck mass which is consistent with Warthin's tumor documented on biopsy. Lungs: clear to ausculatation and percussion; no wheezing or rales Chest wall: without tenderness to palpitation Heart: PMI not displaced, RRR, s1 s2 normal, 2/6 systolic murmur, no diastolic murmur, no rubs, gallops, thrills, or heaves Abdomen: soft, nontender; no hepatosplenomehaly, BS+; abdominal aorta nontender and not dilated by palpation. Back: no CVA tenderness Pulses 2+ upper extremity, 1+ distally Musculoskeletal: full range of motion, normal strength, no joint deformities Extremities: no clubbing cyanosis or edema, Homan's sign negative  Neurologic: grossly nonfocal; Cranial nerves grossly wnl Psychologic: Normal mood and affect   ECG (independently read by me): Sinus bradycardia 52 bpm.  First degree AV block with a PR interval at 248 ms.  Nonspecific ST-T changes  November 2017 ECG (independently read by me), with rhythm strip: Sinus bradycardia 57 bpm.  First-degree AV block with a PR interval at 256 ms.  Rare  PVC with VA conduction.  April 2016 ECG (independently read by me):  Sinus bradycardia 55 bpm.  First-degree AV block with a PR interval at 246 ms.  Nonspecific ST-T changes.  November 2015 ECG (independently read by me): sinus bradycardia at 59 bpm with first-degree AV block.  LVH with repolarization changes.  ECG (independently read by me) normal sinus rhythm at 66 beats per minute.  First degree AV block with a PR interval at 236 ms.  June 2015 ECG (independently read by me): Sinus bradycardia 59 beats per minute.  First degree AV block with PR interval 240 ms.  Nonspecific ST-T changes.  LABS: BMP Latest Ref Rng & Units 04/05/2017 10/18/2016 10/14/2016  Glucose 65 - 99 mg/dL 89 103(H) 93  BUN 7 - 25 mg/dL 18 15 14   Creatinine 0.60 - 0.88 mg/dL 1.24(H) 1.14(H) 1.11(H)  Sodium 135 - 146 mmol/L 143 141 143  Potassium 3.5 - 5.3 mmol/L 4.0 4.7 5.9(H)  Chloride 98 - 110 mmol/L 107 105 108  CO2 20 - 31 mmol/L 21 30 23   Calcium 8.6 - 10.4 mg/dL 9.7 9.5 10.0   Hepatic Function Latest Ref Rng & Units 04/05/2017 10/14/2016 06/29/2016  Total Protein 6.1 - 8.1 g/dL 6.9 6.9 6.5  Albumin 3.6 - 5.1 g/dL 4.4 4.3 3.8  AST 10 - 35 U/L 14 23 19   ALT 6 - 29 U/L 13 13 15   Alk Phosphatase 33 - 130 U/L 43 35 40  Total Bilirubin 0.2 - 1.2 mg/dL 0.8 1.0 0.9   CBC Latest Ref Rng & Units 04/05/2017 10/14/2016 06/29/2016  WBC 3.8 - 10.8 K/uL 5.4 6.4 5.8  Hemoglobin 11.7 - 15.5 g/dL 14.9 14.2 14.1  Hematocrit 35.0 - 45.0 % 45.7(H) 43.1 43.1  Platelets 140 - 400 K/uL 174 148 225   Lab Results  Component Value Date   TSH 2.33 04/05/2017   Lipid Panel     Component Value Date/Time   CHOL 186 04/05/2017 1331   TRIG 85 04/05/2017 1331   HDL 79 04/05/2017 1331   CHOLHDL 2.4 04/05/2017 1331   VLDL 17 04/05/2017 1331   LDLCALC 90 04/05/2017 1331     RADIOLOGY: Mm Digital Screening Bilateral  04/25/2014   CLINICAL DATA:  Screening.  EXAM: DIGITAL SCREENING BILATERAL MAMMOGRAM WITH CAD  COMPARISON:  02/17/2012, 09/22/2010.  ACR Breast Density Category b: There are scattered  areas of fibroglandular density.  FINDINGS: In the right breast, skin thickening warrants further evaluation with physical examination and possibly ultrasound. In the left breast, no findings suspicious for malignancy. Images were processed with CAD.  IMPRESSION: Further evaluation is suggested for possible skin thickening in the right breast.  RECOMMENDATION: Physical examination and possibly ultrasound of the right breast. (Code:FI-R-77M)  The patient will be contacted regarding the findings, and additional imaging will be scheduled.  BI-RADS CATEGORY  0: Incomplete. Need additional imaging evaluation and/or prior mammograms for comparison.   Electronically Signed   By: Luberta Robertson M.D.   On: 04/25/2014 07:41   US Breast Ltd Uni Right Inc Axilla  05/08/2014   CLINICAL DATA:  Screening callback for right breast skin thickening asymmetrically in the inferior breast. The patient denies a history of congestive heart failure. She had right shoulder surgery approximately 1 year ago but denies right arm asymmetric edema.  EXAM: ULTRASOUND OF THE right BREAST  COMPARISON:  Prior mammograms  FINDINGS: On physical exam, I palpate no abnormality in the left breast 6 o'clock/ inferior breast. There is no skin thickening,  induration, or erythema.  Ultrasound is performed, showing minimal skin thickening in the right inferior breast measuring 3 mm as compared to the right breast 12 o'clock location measuring 2 mm in maximal thickness. No underlying intramammary abnormality is identified.  IMPRESSION: Minimal asymmetric prominence of the right inferior breast skin without underlying intramammary abnormality. There is no evidence for active infection or other dermatologic abnormality. This is of unclear clinical significance and clinical followup is recommended as part of the patient's routine healthcare maintenance.  RECOMMENDATION: Screening mammogram in one year.(Code:SM-B-01Y)  I have discussed the findings and  recommendations with the patient. Results were also provided in writing at the conclusion of the visit. If applicable, a reminder letter will be sent to the patient regarding the next appointment.  BI-RADS CATEGORY  1: Negative.   Electronically Signed   By: Conchita Paris M.D.   On: 05/08/2014 15:34   IMPRESSION:  1. Essential hypertension   2. Hypertensive heart disease without heart failure   3. NSVT (nonsustained ventricular tachycardia) ([place])   4. Pure hypercholesterolemia   5. Other fatigue   6. Early onset Alzheimer's dementia without behavioral disturbance       ASSESSMENT AND PLAN: Ms.Caudell is an 83 year old female with a history of hypertension, moderate concentric left ventricular hypertrophy with LVOT gradient at the midcavity level of 10 mm at her previous echo evaluation in 2013 which was not detected on her subsequent echo evaluation.  She has severely calcified mitral annular calcification and mild pulmonary hypertension.  She has previously had a history of nonsustained ventricular tachycardia which had responded to increasing beta blocker therapy.  Her most recent loop recorder evaluation was reviewed.  She has not had any events.  Her major complaint is that of significant fatigability.  She is bradycardic.  Blood pressure initially by the nurse was normal but was slightly increased on recheck by me.  Because of the significant fatigability and sleepiness, I will try to reduce her metoprolol from 100 mg in the morning and 75 mg at night to 75 mg twice a day.  She continues to be on losartan 100 mg in addition to the beta blocker and amlodipine 7.5 mg for blood pressure.  If her blood pressure increases amlodipine may need to be further titrated to 10 mg.  She has been intolerant to other statins but has been able to take Livalo and currently is on 2 mg daily in addition to Zetia 10 mg.  Her most recent lipid studies reveal an LDL cholesterol at 90.  She has a prominent mass in the  left neck in which previously had been biopsied and was consistent with a Warthin's tumor.  She has had issues with worsening short-term memory loss.  She has been seen by neurology.  I will see her in 6 months for reevaluation or sooner if problems arise.  Troy Sine, MD, [facility]  04/09/2017 6:11 PM

## 2017-04-07 NOTE — Patient Instructions (Signed)
Your physician has recommended you make the following change in your medication:   1.) the lopressor has been changed to 75 mg twice a day. ( 1& 1/2 tablet)  Your physician wants you to follow-up in: 6 months or sooner if needed. You will receive a reminder letter in the mail two months in advance. If you don't receive a letter, please call our office to schedule the follow-up appointment.  If you need a refill on your cardiac medications before your next appointment, please call your pharmacy.

## 2017-04-14 ENCOUNTER — Ambulatory Visit (INDEPENDENT_AMBULATORY_CARE_PROVIDER_SITE_OTHER): Payer: Medicare Other | Admitting: *Deleted

## 2017-04-14 DIAGNOSIS — R55 Syncope and collapse: Secondary | ICD-10-CM | POA: Diagnosis not present

## 2017-04-14 NOTE — Progress Notes (Signed)
[facility] Summary Report 

## 2017-04-16 LAB — CUP PACEART REMOTE DEVICE CHECK
Date Time Interrogation Session: 20180531141145
Implantable Pulse Generator Implant Date: 20151013

## 2017-04-16 NOTE — Progress Notes (Signed)
Carelink summary report received. Battery status OK. Normal device function. No new symptom episodes, tachy episodes, brady, or pause episodes. No new AF episodes. Monthly summary reports and ROV/PRN 

## 2017-04-20 DIAGNOSIS — Z961 Presence of intraocular lens: Secondary | ICD-10-CM | POA: Diagnosis not present

## 2017-04-20 DIAGNOSIS — H1820 Unspecified corneal edema: Secondary | ICD-10-CM | POA: Diagnosis not present

## 2017-04-20 DIAGNOSIS — H401133 Primary open-angle glaucoma, bilateral, severe stage: Secondary | ICD-10-CM | POA: Diagnosis not present

## 2017-04-25 ENCOUNTER — Other Ambulatory Visit: Payer: Self-pay | Admitting: Cardiovascular Disease

## 2017-04-27 ENCOUNTER — Other Ambulatory Visit: Payer: Self-pay | Admitting: Cardiovascular Disease

## 2017-05-15 ENCOUNTER — Other Ambulatory Visit: Payer: Self-pay | Admitting: Cardiovascular Disease

## 2017-05-16 ENCOUNTER — Ambulatory Visit (INDEPENDENT_AMBULATORY_CARE_PROVIDER_SITE_OTHER): Payer: Medicare Other | Admitting: *Deleted

## 2017-05-16 DIAGNOSIS — R55 Syncope and collapse: Secondary | ICD-10-CM

## 2017-05-16 NOTE — Telephone Encounter (Signed)
Rx(s) sent to pharmacy electronically.  

## 2017-05-16 NOTE — Progress Notes (Signed)
Carelink Summary Report / Loop Recorder 

## 2017-06-01 LAB — CUP PACEART REMOTE DEVICE CHECK
Date Time Interrogation Session: 20180630144258
Implantable Pulse Generator Implant Date: 20151013

## 2017-06-13 ENCOUNTER — Ambulatory Visit (INDEPENDENT_AMBULATORY_CARE_PROVIDER_SITE_OTHER): Payer: Medicare Other | Admitting: *Deleted

## 2017-06-13 DIAGNOSIS — R55 Syncope and collapse: Secondary | ICD-10-CM | POA: Diagnosis not present

## 2017-06-13 NOTE — Progress Notes (Signed)
Carelink Summary Report / Loop Recorder 

## 2017-06-25 LAB — CUP PACEART REMOTE DEVICE CHECK
Date Time Interrogation Session: 20180730154103
Implantable Pulse Generator Implant Date: 20151013

## 2017-06-25 NOTE — Progress Notes (Signed)
Carelink summary report received. Battery status OK. Normal device function. No new symptom episodes, tachy episodes, brady, or pause episodes. No new AF episodes. Monthly summary reports and ROV/PRN 

## 2017-07-04 ENCOUNTER — Other Ambulatory Visit: Payer: Self-pay | Admitting: Cardiovascular Disease

## 2017-07-04 DIAGNOSIS — I739 Peripheral vascular disease, unspecified: Secondary | ICD-10-CM

## 2017-07-05 ENCOUNTER — Ambulatory Visit (HOSPITAL_COMMUNITY)
Admission: RE | Admit: 2017-07-05 | Discharge: 2017-07-05 | Disposition: A | Discharge status: Home / Self Care | Payer: Medicare Other | Source: Ambulatory Visit | Attending: Cardiology | Admitting: Cardiology

## 2017-07-05 DIAGNOSIS — Z87891 Personal history of nicotine dependence: Secondary | ICD-10-CM | POA: Insufficient documentation

## 2017-07-05 DIAGNOSIS — I739 Peripheral vascular disease, unspecified: Secondary | ICD-10-CM | POA: Diagnosis not present

## 2017-07-05 DIAGNOSIS — Z95828 Presence of other vascular implants and grafts: Secondary | ICD-10-CM | POA: Diagnosis not present

## 2017-07-05 DIAGNOSIS — I10 Essential (primary) hypertension: Secondary | ICD-10-CM | POA: Insufficient documentation

## 2017-07-05 DIAGNOSIS — E785 Hyperlipidemia, unspecified: Secondary | ICD-10-CM | POA: Diagnosis not present

## 2017-07-07 ENCOUNTER — Other Ambulatory Visit: Payer: Self-pay | Admitting: *Deleted

## 2017-07-07 MED ORDER — OMEGA-3-ACID ETHYL ESTERS 1 G PO CAPS: 1.0000 g | ORAL_CAPSULE | Freq: Every evening | ORAL | 3 refills | Status: DC

## 2017-07-13 ENCOUNTER — Ambulatory Visit (INDEPENDENT_AMBULATORY_CARE_PROVIDER_SITE_OTHER): Payer: Medicare Other | Admitting: *Deleted

## 2017-07-13 DIAGNOSIS — R55 Syncope and collapse: Secondary | ICD-10-CM

## 2017-07-14 LAB — CUP PACEART REMOTE DEVICE CHECK
Date Time Interrogation Session: 20180830001229
Implantable Pulse Generator Implant Date: 20151013

## 2017-07-14 NOTE — Progress Notes (Signed)
Carelink Summary Report / Loop Recorder 

## 2017-08-11 ENCOUNTER — Other Ambulatory Visit: Payer: Self-pay | Admitting: Cardiovascular Disease

## 2017-08-12 ENCOUNTER — Ambulatory Visit (INDEPENDENT_AMBULATORY_CARE_PROVIDER_SITE_OTHER): Payer: Medicare Other | Admitting: *Deleted

## 2017-08-12 DIAGNOSIS — R55 Syncope and collapse: Secondary | ICD-10-CM | POA: Diagnosis not present

## 2017-08-15 NOTE — Progress Notes (Signed)
Carelink Summary Report / Loop Recorder 

## 2017-08-16 LAB — CUP PACEART REMOTE DEVICE CHECK
Date Time Interrogation Session: 20180929001240
Implantable Pulse Generator Implant Date: 20151013

## 2017-09-02 ENCOUNTER — Ambulatory Visit (INDEPENDENT_AMBULATORY_CARE_PROVIDER_SITE_OTHER): Payer: Medicare Other | Admitting: Podiatry

## 2017-09-02 VITALS — BP 158/70 | HR 52 | Ht 60.0 in | Wt 127.0 lb

## 2017-09-02 DIAGNOSIS — B351 Tinea unguium: Secondary | ICD-10-CM

## 2017-09-02 DIAGNOSIS — D689 Coagulation defect, unspecified: Secondary | ICD-10-CM

## 2017-09-02 DIAGNOSIS — R109 Unspecified abdominal pain: Secondary | ICD-10-CM

## 2017-09-02 NOTE — Progress Notes (Signed)
Subjective:    Patient ID: Julie Bass, female    DOB: 1926/09/22, 83 y.o.   MRN: [id number]  HPI  Chief Complaint  Patient presents with  . Nail Problem    bilateral elongated, thick toenails; non-diabetic, has PAD - no pain  . Difficulty Walking    bilateral ankle swelling x 1 day - no pain   83 y.o. female presents with the above complaint. Endorses elongated thickened nails bilat. On Plavix for anti-coagulation. Also reports swelling to her ankles.  Past Medical History:  Diagnosis Date  . Arthritis   . Chronic kidney disease    KIDNEY STONES  . Dementia    BEGINNING STAGES   . Depression   . GERD (gastroesophageal reflux disease)   . Glaucoma   . Hiatal hernia   . HTN (hypertension) 10/18/2011  . Hyperlipemia 10/18/2011  . NSVT (nonsustained ventricular tachycardia) ([place]) 10/18/2011  . Osteoporosis   . PAD (peripheral artery disease) ([place]) 10/18/2011   h/o left SFA stent   Past Surgical History:  Procedure Laterality Date  . ABDOMINAL HYSTERECTOMY  1973  . CARDIAC CATHETERIZATION     2012  . CHOLECYSTECTOMY    . LOOP RECORDER IMPLANT N/A 08/27/2014   Procedure: LOOP RECORDER IMPLANT;  Surgeon: Coralyn Mark, MD;  Location: [facility];  Service: Cardiovascular;  Laterality: N/A;  . LOWER EXTREMITY ANGIOGRAM  11/02/2007   stent of mid left SFA with 6x174mm EV3 self-expanding stent and 6x3 in prox region (Dr. Adora Fridge)  . LOWER EXTREMITY ANGIOGRAM    . LOWER EXTREMITY ARTERIAL DOPPLER  2013   right SFA with 50-69% diameter reduction, right PTA/peroneal occluded, L SFA prox to stent has narrowing with increased velocities >60% diameter reduction, L SFA stent patent, L ATA w/occlusive disease, bilat ABIs show mild arterial insuffiency at rest  . NM MYOCAR PERF WALL MOTION  10/2011   non-gated - normal stuy, EF 82%, normal LV wall motion  . REVERSE SHOULDER ARTHROPLASTY Right 01/26/2013   Procedure: RIGHT REVERSE TOTAL SHOULDER ARTHROPLASTY;  Surgeon: Augustin Schooling, MD;  Location: [facility];  Service: Orthopedics;  Laterality: Right;  . TRANSTHORACIC ECHOCARDIOGRAM  10/2012   EF 75-70%, mod conc hypertrophy, severely calcified MV annulus, LA mildly dailted, PA peak pressure 71mmHg    Current Outpatient Prescriptions:  .  amLODipine (NORVASC) 5 MG tablet, TAKE 1.5 TABLETS (7.5 MG TOTAL) BY MOUTH AT BEDTIME., Disp: 135 tablet, Rfl: 2 .  aspirin EC 81 MG tablet, Take 81 mg by mouth every evening. , Disp: , Rfl:  .  citalopram (CELEXA) 10 MG tablet, TAKE 1 TABLET (10 MG TOTAL) BY MOUTH DAILY., Disp: 90 tablet, Rfl: 3 .  clopidogrel (PLAVIX) 75 MG tablet, TAKE 1 TABLET BY MOUTH DAILY, Disp: 90 tablet, Rfl: 3 .  ezetimibe (ZETIA) 10 MG tablet, Take 1 tablet (10 mg total) by mouth daily., Disp: 90 tablet, Rfl: 3 .  feeding supplement, ENSURE COMPLETE, (ENSURE COMPLETE) LIQD, Take 237 mLs by mouth 3 (three) times daily between meals., Disp: , Rfl:  .  LIVALO 2 MG TABS, TAKE 1 TABLET (2 MG TOTAL) BY MOUTH DAILY., Disp: 90 tablet, Rfl: 2 .  losartan (COZAAR) 100 MG tablet, TAKE 1 TABLET BY MOUTH DAILY, Disp: 90 tablet, Rfl: 3 .  metoprolol tartrate (LOPRESSOR) 50 MG tablet, Take 1.5 tablets twice a day, Disp: 315 tablet, Rfl: 3 .  Multiple Vitamins-Minerals (SENIOR MULTIVITAMIN PLUS PO), Take 1 tablet by mouth daily., Disp: , Rfl:  .  omega-3 acid ethyl esters (LOVAZA) 1 g capsule, Take 1 capsule (1 g total) by mouth every evening., Disp: 90 capsule, Rfl: 3 .  pantoprazole (PROTONIX) 40 MG tablet, Take 1 tablet (40 mg total) by mouth 2 (two) times daily. (Patient taking differently: Take 40 mg by mouth every morning. ), Disp: 180 tablet, Rfl: 3 .  timolol (TIMOPTIC) 0.5 % ophthalmic solution, Place 1 drop into both eyes 2 (two) times daily. , Disp: , Rfl:   Allergies  Allergen Reactions  . Crestor [Rosuvastatin Calcium]     Muscle pain  . Lipitor [Atorvastatin Calcium]     Muscle pain    Review of Systems     Objective:   Physical Exam Vitals:    09/02/17 1328  BP: (!) 158/70  Pulse: (!) 52   General AA&O x3. Normal mood and affect.  Vascular Dorsalis pedis and posterior tibial pulses  present 1+ bilaterally  Capillary refill normal to all digits. Pedal hair growth absent.  Neurologic Epicritic sensation grossly present.  Dermatologic No open lesions. Interspaces clear of maceration. Nails elongated and thickened x10 with yellow discoloration.  Orthopedic: MMT 5/5 in dorsiflexion, plantarflexion, inversion, and eversion. Normal joint ROM without pain or crepitus.       Assessment & Plan:  Patient was evaluated and treated and all questions answered  Onychomycosis -Nails debrided as below. -Follow up in 3 months for routine care d/t blood thinner  Procedure: Nail Debridement Rationale: Patient meets criteria for routine foot care due to coagulation defect. Type of Debridement: manual, sharp debridement. Instrumentation: Nail nipper, rotary burr. Number of Nails: 10

## 2017-09-12 ENCOUNTER — Other Ambulatory Visit: Payer: Self-pay | Admitting: *Deleted

## 2017-09-12 ENCOUNTER — Ambulatory Visit (INDEPENDENT_AMBULATORY_CARE_PROVIDER_SITE_OTHER): Payer: Medicare Other | Admitting: *Deleted

## 2017-09-12 DIAGNOSIS — R55 Syncope and collapse: Secondary | ICD-10-CM | POA: Diagnosis not present

## 2017-09-12 MED ORDER — CITALOPRAM HYDROBROMIDE 10 MG PO TABS: 10.0000 mg | ORAL_TABLET | Freq: Every day | ORAL | 3 refills | Status: DC

## 2017-09-12 NOTE — Telephone Encounter (Signed)
Rx has been sent to the pharmacy electronically. ° °

## 2017-09-13 NOTE — Progress Notes (Signed)
Carelink Summary Report / Loop Recorder 

## 2017-09-16 LAB — CUP PACEART REMOTE DEVICE CHECK
Date Time Interrogation Session: 20181029003939
Implantable Pulse Generator Implant Date: 20151013

## 2017-10-11 ENCOUNTER — Ambulatory Visit (INDEPENDENT_AMBULATORY_CARE_PROVIDER_SITE_OTHER): Payer: Medicare Other | Admitting: *Deleted

## 2017-10-11 DIAGNOSIS — R55 Syncope and collapse: Secondary | ICD-10-CM | POA: Diagnosis not present

## 2017-10-12 NOTE — Progress Notes (Signed)
Carelink Summary Report / Loop Recorder 

## 2017-10-19 DIAGNOSIS — H401133 Primary open-angle glaucoma, bilateral, severe stage: Secondary | ICD-10-CM | POA: Diagnosis not present

## 2017-10-19 DIAGNOSIS — H1820 Unspecified corneal edema: Secondary | ICD-10-CM | POA: Diagnosis not present

## 2017-10-27 LAB — CUP PACEART REMOTE DEVICE CHECK
Date Time Interrogation Session: 20181128021203
Implantable Pulse Generator Implant Date: 20151013

## 2017-11-01 ENCOUNTER — Other Ambulatory Visit: Payer: Self-pay | Admitting: Cardiovascular Disease

## 2017-11-09 DIAGNOSIS — H02002 Unspecified entropion of right lower eyelid: Secondary | ICD-10-CM | POA: Diagnosis not present

## 2017-11-10 ENCOUNTER — Ambulatory Visit (INDEPENDENT_AMBULATORY_CARE_PROVIDER_SITE_OTHER): Payer: Medicare Other | Admitting: *Deleted

## 2017-11-10 DIAGNOSIS — R55 Syncope and collapse: Secondary | ICD-10-CM | POA: Diagnosis not present

## 2017-11-11 NOTE — Progress Notes (Signed)
Carelink Summary Report / Loop Recorder 

## 2017-11-17 DIAGNOSIS — H02002 Unspecified entropion of right lower eyelid: Secondary | ICD-10-CM | POA: Diagnosis not present

## 2017-11-21 DIAGNOSIS — H02002 Unspecified entropion of right lower eyelid: Secondary | ICD-10-CM | POA: Diagnosis not present

## 2017-11-21 LAB — CUP PACEART REMOTE DEVICE CHECK
Date Time Interrogation Session: 20181228010945
Implantable Pulse Generator Implant Date: 20151013

## 2017-11-25 ENCOUNTER — Telehealth: Payer: Self-pay | Admitting: *Deleted

## 2017-11-25 NOTE — Telephone Encounter (Signed)
LMOVM (DPR) for Julie Bass requesting call back to the [facility].  Gave direct phone number for return call.  LINQ at [facility]RT as of 11/23/16.

## 2017-12-02 ENCOUNTER — Other Ambulatory Visit: Payer: Self-pay | Admitting: Cardiovascular Disease

## 2017-12-02 NOTE — Telephone Encounter (Signed)
REFILL 

## 2017-12-05 NOTE — Telephone Encounter (Signed)
Spoke with [name] (DPR), who reports that the patient does not want her LINQ removed at this time.  She also declines an appointment with Dr. Sallyanne Kuster.  [name] is agreeable to sending monitor back to [employer] via prepaid return kit.  Unenrolled from [facility], return kit ordered.

## 2018-01-07 ENCOUNTER — Emergency Department (HOSPITAL_COMMUNITY): Payer: Medicare Other

## 2018-01-07 ENCOUNTER — Encounter (HOSPITAL_COMMUNITY): Payer: Self-pay

## 2018-01-07 ENCOUNTER — Inpatient Hospital Stay (HOSPITAL_COMMUNITY)
Admission: EM | Admit: 2018-01-07 | Discharge: 2018-01-10 | DRG: 244 | Disposition: A | Discharge status: Home Health | Payer: Medicare Other | Attending: Internal Medicine | Admitting: Internal Medicine

## 2018-01-07 DIAGNOSIS — Z950 Presence of cardiac pacemaker: Secondary | ICD-10-CM | POA: Diagnosis not present

## 2018-01-07 DIAGNOSIS — I6529 Occlusion and stenosis of unspecified carotid artery: Secondary | ICD-10-CM | POA: Diagnosis not present

## 2018-01-07 DIAGNOSIS — Z7982 Long term (current) use of aspirin: Secondary | ICD-10-CM

## 2018-01-07 DIAGNOSIS — F0390 Unspecified dementia without behavioral disturbance: Secondary | ICD-10-CM | POA: Diagnosis not present

## 2018-01-07 DIAGNOSIS — E785 Hyperlipidemia, unspecified: Secondary | ICD-10-CM | POA: Diagnosis not present

## 2018-01-07 DIAGNOSIS — S299XXA Unspecified injury of thorax, initial encounter: Secondary | ICD-10-CM | POA: Diagnosis not present

## 2018-01-07 DIAGNOSIS — I442 Atrioventricular block, complete: Principal | ICD-10-CM | POA: Diagnosis present

## 2018-01-07 DIAGNOSIS — Z4509 Encounter for adjustment and management of other cardiac device: Secondary | ICD-10-CM | POA: Diagnosis not present

## 2018-01-07 DIAGNOSIS — I5030 Unspecified diastolic (congestive) heart failure: Secondary | ICD-10-CM | POA: Diagnosis not present

## 2018-01-07 DIAGNOSIS — I10 Essential (primary) hypertension: Secondary | ICD-10-CM | POA: Diagnosis present

## 2018-01-07 DIAGNOSIS — W19XXXA Unspecified fall, initial encounter: Secondary | ICD-10-CM | POA: Diagnosis present

## 2018-01-07 DIAGNOSIS — I129 Hypertensive chronic kidney disease with stage 1 through stage 4 chronic kidney disease, or unspecified chronic kidney disease: Secondary | ICD-10-CM | POA: Diagnosis present

## 2018-01-07 DIAGNOSIS — Z96611 Presence of right artificial shoulder joint: Secondary | ICD-10-CM | POA: Diagnosis present

## 2018-01-07 DIAGNOSIS — R072 Precordial pain: Secondary | ICD-10-CM

## 2018-01-07 DIAGNOSIS — K449 Diaphragmatic hernia without obstruction or gangrene: Secondary | ICD-10-CM | POA: Diagnosis present

## 2018-01-07 DIAGNOSIS — K219 Gastro-esophageal reflux disease without esophagitis: Secondary | ICD-10-CM | POA: Diagnosis present

## 2018-01-07 DIAGNOSIS — Z95818 Presence of other cardiac implants and grafts: Secondary | ICD-10-CM | POA: Diagnosis not present

## 2018-01-07 DIAGNOSIS — Z87891 Personal history of nicotine dependence: Secondary | ICD-10-CM | POA: Diagnosis not present

## 2018-01-07 DIAGNOSIS — I739 Peripheral vascular disease, unspecified: Secondary | ICD-10-CM | POA: Diagnosis present

## 2018-01-07 DIAGNOSIS — F329 Major depressive disorder, single episode, unspecified: Secondary | ICD-10-CM | POA: Diagnosis present

## 2018-01-07 DIAGNOSIS — R079 Chest pain, unspecified: Secondary | ICD-10-CM | POA: Diagnosis present

## 2018-01-07 DIAGNOSIS — I472 Ventricular tachycardia: Secondary | ICD-10-CM | POA: Diagnosis present

## 2018-01-07 DIAGNOSIS — M6281 Muscle weakness (generalized): Secondary | ICD-10-CM | POA: Diagnosis not present

## 2018-01-07 DIAGNOSIS — W010XXA Fall on same level from slipping, tripping and stumbling without subsequent striking against object, initial encounter: Secondary | ICD-10-CM | POA: Diagnosis present

## 2018-01-07 DIAGNOSIS — N183 Chronic kidney disease, stage 3 unspecified: Secondary | ICD-10-CM | POA: Diagnosis present

## 2018-01-07 DIAGNOSIS — Z8249 Family history of ischemic heart disease and other diseases of the circulatory system: Secondary | ICD-10-CM | POA: Diagnosis not present

## 2018-01-07 DIAGNOSIS — Z95828 Presence of other vascular implants and grafts: Secondary | ICD-10-CM

## 2018-01-07 DIAGNOSIS — Z7902 Long term (current) use of antithrombotics/antiplatelets: Secondary | ICD-10-CM | POA: Diagnosis not present

## 2018-01-07 DIAGNOSIS — H409 Unspecified glaucoma: Secondary | ICD-10-CM | POA: Diagnosis not present

## 2018-01-07 DIAGNOSIS — I4729 Other ventricular tachycardia: Secondary | ICD-10-CM

## 2018-01-07 DIAGNOSIS — I440 Atrioventricular block, first degree: Secondary | ICD-10-CM | POA: Diagnosis not present

## 2018-01-07 DIAGNOSIS — R55 Syncope and collapse: Secondary | ICD-10-CM | POA: Diagnosis present

## 2018-01-07 DIAGNOSIS — I459 Conduction disorder, unspecified: Secondary | ICD-10-CM | POA: Diagnosis present

## 2018-01-07 DIAGNOSIS — I455 Other specified heart block: Secondary | ICD-10-CM | POA: Diagnosis present

## 2018-01-07 DIAGNOSIS — M25511 Pain in right shoulder: Secondary | ICD-10-CM | POA: Diagnosis not present

## 2018-01-07 DIAGNOSIS — Z888 Allergy status to other drugs, medicaments and biological substances status: Secondary | ICD-10-CM

## 2018-01-07 DIAGNOSIS — S279XXA Injury of unspecified intrathoracic organ, initial encounter: Secondary | ICD-10-CM | POA: Diagnosis not present

## 2018-01-07 DIAGNOSIS — R0789 Other chest pain: Secondary | ICD-10-CM | POA: Diagnosis present

## 2018-01-07 DIAGNOSIS — Z79899 Other long term (current) drug therapy: Secondary | ICD-10-CM

## 2018-01-07 LAB — URINALYSIS, ROUTINE W REFLEX MICROSCOPIC
Bilirubin Urine: NEGATIVE
Glucose, UA: NEGATIVE mg/dL
Ketones, ur: 5 mg/dL — AB
Leukocytes, UA: NEGATIVE
Nitrite: NEGATIVE
Protein, ur: NEGATIVE mg/dL
Specific Gravity, Urine: 1.006 (ref 1.005–1.030)
pH: 7 (ref 5.0–8.0)

## 2018-01-07 LAB — CBC
HCT: 49.6 % — ABNORMAL HIGH (ref 36.0–46.0)
Hemoglobin: 16.4 g/dL — ABNORMAL HIGH (ref 12.0–15.0)
MCH: 31.8 pg (ref 26.0–34.0)
MCHC: 33.1 g/dL (ref 30.0–36.0)
MCV: 96.3 fL (ref 78.0–100.0)
Platelets: 147 10*3/uL — ABNORMAL LOW (ref 150–400)
RBC: 5.15 MIL/uL — ABNORMAL HIGH (ref 3.87–5.11)
RDW: 12.9 % (ref 11.5–15.5)
WBC: 10.7 10*3/uL — ABNORMAL HIGH (ref 4.0–10.5)

## 2018-01-07 LAB — BASIC METABOLIC PANEL
Anion gap: 11 (ref 5–15)
BUN: 14 mg/dL (ref 6–20)
CO2: 25 mmol/L (ref 22–32)
Calcium: 10.2 mg/dL (ref 8.9–10.3)
Chloride: 103 mmol/L (ref 101–111)
Creatinine, Ser: 1.2 mg/dL — ABNORMAL HIGH (ref 0.44–1.00)
GFR calc Af Amer: 45 mL/min — ABNORMAL LOW (ref >60)
GFR calc non Af Amer: 39 mL/min — ABNORMAL LOW (ref >60)
Glucose, Bld: 118 mg/dL — ABNORMAL HIGH (ref 65–99)
Potassium: 4.6 mmol/L (ref 3.5–5.1)
Sodium: 139 mmol/L (ref 135–145)

## 2018-01-07 LAB — I-STAT TROPONIN, ED: Troponin i, poc: 0.01 ng/mL (ref 0.00–0.08)

## 2018-01-07 LAB — CBG MONITORING, ED: Glucose-Capillary: 115 mg/dL — ABNORMAL HIGH (ref 65–99)

## 2018-01-07 NOTE — ED Provider Notes (Signed)
Emergency Department Provider Note   I have reviewed the triage vital signs and the nursing notes.   HISTORY  Chief Complaint Loss of Consciousness   HPI Julie Bass is a 84 y.o. female with PMH of wide complex tachycardia and syncope presents to the emergency department for evaluation of syncope at home.  The patient was standing at the sink when she suddenly lost consciousness.  She states next thing she remembers she was on the and was experiencing chest pain.  She continued to feel somewhat lightheaded and called her family to help her in the kitchen.  She describes the pain as a pressure radiating across the top of her chest bilaterally.  No radiation of symptoms or modifying factors.  No pleuritic discomfort.  Patient does not believe she struck her head.  The syncopal event was not witnessed by family.  She does have a [employer] loop recorder placed previously for similar symptoms.    Past Medical History:  Diagnosis Date  . Arthritis   . Chronic kidney disease    KIDNEY STONES  . Dementia    BEGINNING STAGES   . Depression   . GERD (gastroesophageal reflux disease)   . Glaucoma   . Hiatal hernia   . HTN (hypertension) 10/18/2011  . Hyperlipemia 10/18/2011  . NSVT (nonsustained ventricular tachycardia) ([place]) 10/18/2011  . Osteoporosis   . PAD (peripheral artery disease) ([place]) 10/18/2011   h/o left SFA stent    Patient Active Problem List   Diagnosis Date Noted  . GERD (gastroesophageal reflux disease) 01/08/2018  . Chest pain 01/07/2018  . CKD (chronic kidney disease), stage III ([place]) 01/07/2018  . Bacteremia 06/16/2016  . UTI (lower urinary tract infection) 06/16/2016  . Warthin's tumor 01/01/2016  . Dementia 03/14/2015  . Orthostatic hypotension 08/27/2014  . Physical deconditioning 08/27/2014  . Abdominal pain in female 08/27/2014  . Osteoarthrosis, unspecified whether generalized or localized, shoulder region 01/26/2013  . Chest wall pain 12/26/2012  .  Rib contusion 12/26/2012  . NSVT (nonsustained ventricular tachycardia) ([place]) 10/18/2011  . HTN (hypertension) 10/18/2011  . HLD (hyperlipidemia) 10/18/2011  . PAD (peripheral artery disease) ([place]) 10/18/2011  . Syncope 09/30/2011  . Cough 09/30/2011  . Fatigue 09/30/2011  . Fall 09/30/2011  . Pelvic joint pain 09/30/2011  . Back pain 09/30/2011  . Need for prophylactic vaccination and inoculation against influenza 09/30/2011    Past Surgical History:  Procedure Laterality Date  . ABDOMINAL HYSTERECTOMY  1973  . CARDIAC CATHETERIZATION     2012  . CHOLECYSTECTOMY    . LOOP RECORDER IMPLANT N/A 08/27/2014   Procedure: LOOP RECORDER IMPLANT;  Surgeon: Coralyn Mark, MD;  Location: [facility];  Service: Cardiovascular;  Laterality: N/A;  . LOWER EXTREMITY ANGIOGRAM  11/02/2007   stent of mid left SFA with 6x133mm EV3 self-expanding stent and 6x3 in prox region (Dr. Adora Fridge)  . LOWER EXTREMITY ANGIOGRAM    . LOWER EXTREMITY ARTERIAL DOPPLER  2013   right SFA with 50-69% diameter reduction, right PTA/peroneal occluded, L SFA prox to stent has narrowing with increased velocities >60% diameter reduction, L SFA stent patent, L ATA w/occlusive disease, bilat ABIs show mild arterial insuffiency at rest  . NM MYOCAR PERF WALL MOTION  10/2011   non-gated - normal stuy, EF 82%, normal LV wall motion  . REVERSE SHOULDER ARTHROPLASTY Right 01/26/2013   Procedure: RIGHT REVERSE TOTAL SHOULDER ARTHROPLASTY;  Surgeon: Augustin Schooling, MD;  Location: [facility];  Service: Orthopedics;  Laterality: Right;  . TRANSTHORACIC ECHOCARDIOGRAM  10/2012   EF 75-70%, mod conc hypertrophy, severely calcified MV annulus, LA mildly dailted, PA peak pressure 29mmHg      Allergies Crestor [rosuvastatin calcium] and Lipitor [atorvastatin calcium]  Family History  Problem Relation Age of Onset  . Throat cancer Father   . Cancer Mother   . CAD Son   . Breast cancer Sister        cervical cancer  . Colon  cancer Brother        throat cancer  . Pulmonary embolism Daughter   . Hypertension Daughter     Social History Social History   Tobacco Use  . Smoking status: Former Smoker    Last attempt to quit: 05/07/1984    Years since quitting: 33.6  . Smokeless tobacco: Never Used  Substance Use Topics  . Alcohol use: No  . Drug use: No    Review of Systems  Constitutional: No fever/chills Eyes: No visual changes. ENT: No sore throat. Cardiovascular: Positive chest pain and syncope.  Respiratory: Denies shortness of breath. Gastrointestinal: No abdominal pain.  No nausea, no vomiting.  No diarrhea.  No constipation. Genitourinary: Negative for dysuria. Musculoskeletal: Negative for back pain. Skin: Negative for rash. Neurological: Negative for headaches, focal weakness or numbness.   10-point ROS otherwise negative.  ____________________________________________   PHYSICAL EXAM:  VITAL SIGNS: ED Triage Vitals  Enc Vitals Group     BP 01/07/18 2016 (!) 181/66     Pulse Rate 01/07/18 2016 63     Resp 01/07/18 2016 14     Temp 01/07/18 2016 (!) 97.3 F (36.3 C)     Temp Source 01/07/18 2016 Oral     SpO2 01/07/18 2011 100 %   Constitutional: Alert and oriented. Well appearing and in no acute distress. Eyes: Conjunctivae are normal.  Head: Atraumatic. Nose: No congestion/rhinnorhea. Mouth/Throat: Mucous membranes are moist.   Neck: No stridor Cardiovascular: Normal rate, regular rhythm. Good peripheral circulation. Grossly normal heart sounds. Tenderness to palpation over the sternum.  Respiratory: Normal respiratory effort.  No retractions. Lungs CTAB. Gastrointestinal: Soft and nontender. No distention.  Musculoskeletal: No lower extremity tenderness nor edema. No gross deformities of extremities. Neurologic:  Normal speech and language. No gross focal neurologic deficits are appreciated.  Skin:  Skin is warm, dry and intact. No rash  noted.   ____________________________________________   LABS (all labs ordered are listed, but only abnormal results are displayed)  Labs Reviewed  BASIC METABOLIC PANEL - Abnormal; Notable for the following components:      Result Value   Glucose, Bld 118 (*)    Creatinine, Ser 1.20 (*)    GFR calc non Af Amer 39 (*)    GFR calc Af Amer 45 (*)    All other components within normal limits  CBC - Abnormal; Notable for the following components:   WBC 10.7 (*)    RBC 5.15 (*)    Hemoglobin 16.4 (*)    HCT 49.6 (*)    Platelets 147 (*)    All other components within normal limits  URINALYSIS, ROUTINE W REFLEX MICROSCOPIC - Abnormal; Notable for the following components:   Color, Urine STRAW (*)    Hgb urine dipstick MODERATE (*)    Ketones, ur 5 (*)    Bacteria, UA FEW (*)    Squamous Epithelial / LPF 0-5 (*)    All other components within normal limits  BASIC METABOLIC PANEL - Abnormal; Notable for the  following components:   Glucose, Bld 113 (*)    Creatinine, Ser 1.13 (*)    GFR calc non Af Amer 42 (*)    GFR calc Af Amer 48 (*)    All other components within normal limits  GLUCOSE, CAPILLARY - Abnormal; Notable for the following components:   Glucose-Capillary 107 (*)    All other components within normal limits  CBG MONITORING, ED - Abnormal; Notable for the following components:   Glucose-Capillary 115 (*)    All other components within normal limits  LIPID PANEL  TROPONIN I  TROPONIN I  CBC  HEMOGLOBIN A1C  TROPONIN I  I-STAT TROPONIN, ED   ____________________________________________  EKG   EKG Interpretation  Date/Time:  Saturday January 07 2018 20:10:03 EST Ventricular Rate:  62 PR Interval:    QRS Duration: 90 QT Interval:  440 QTC Calculation: 447 R Axis:   30 Text Interpretation:  Sinus rhythm Prolonged PR interval Probable LVH with secondary repol abnrm No STEMI.  Confirmed by Nanda Quinton [phone]) on 01/07/2018 8:14:53 PM        ____________________________________________  RADIOLOGY  Dg Chest 2 View  Result Date: 01/07/2018 CLINICAL DATA:  Chest pain EXAM: CHEST  2 VIEW COMPARISON:  06/13/2016 FINDINGS: Partially visualized right shoulder replacement. No pleural effusion. Borderline to mild cardiomegaly. No focal consolidation or effusion. Aortic atherosclerosis. No pneumothorax. Thoracic compression deformities of the mid to lower spine. IMPRESSION: No active cardiopulmonary disease.  Borderline to mild cardiomegaly. Electronically Signed   By: Donavan Foil M.D.   On: 01/07/2018 21:10   Dg Shoulder Right  Result Date: 01/08/2018 CLINICAL DATA:  Right shoulder pain EXAM: RIGHT SHOULDER - 2+ VIEW COMPARISON:  08/23/2014 FINDINGS: Status post reverse right shoulder replacement with intact hardware. Normal alignment. No obvious fracture. IMPRESSION: Status post right shoulder replacement. No definite acute osseous abnormality Electronically Signed   By: Donavan Foil M.D.   On: 01/08/2018 00:52   Ct Head Wo Contrast  Result Date: 01/08/2018 CLINICAL DATA:  84 year old female with syncope. EXAM: CT HEAD WITHOUT CONTRAST TECHNIQUE: Contiguous axial images were obtained from the base of the skull through the vertex without intravenous contrast. COMPARISON:  Head CT dated 02/10/2015 FINDINGS: Brain: There is moderate to advanced age-related atrophy and chronic microvascular ischemic changes. There is no acute intracranial hemorrhage. No mass effect or midline shift. Dilated extra-axial/subdural spaces with low attenuating fluid likely represent old subdural hematoma or hygroma and measure approximately 15 mm over the right frontal lobe. Vascular: No hyperdense vessel or unexpected calcification. Skull: Normal. Negative for fracture or focal lesion. Sinuses/Orbits: Mild mucoperiosteal thickening of paranasal sinuses. No air-fluid level. Mastoid air cells are clear. Other: None IMPRESSION: 1. No acute intracranial hemorrhage.  2. Moderate severe age-related atrophy and chronic microvascular ischemic changes. 3. Dilated extra-axial spaces likely representing old subdural hematoma or hygroma. Electronically Signed   By: Anner Crete M.D.   On: 01/08/2018 00:48    ____________________________________________   PROCEDURES  Procedure(s) performed:   Procedures  None ____________________________________________   INITIAL IMPRESSION / ASSESSMENT AND PLAN / ED COURSE  Pertinent labs & imaging results that were available during my care of the patient were reviewed by me and considered in my medical decision making (see chart for details).  Patient presents to the emergency department for evaluation of syncopal event at home with chest pain upon waking up on the floor.  Patient has had syncope in the past and has a [employer] loop recorder placed in 2015. No  recent events.   Labs, Imaging, and EKG reviewed. No acute abnormality. Plan for admission for CP and syncope evaluation. Ordered [employer] loop interrogation but this is pending.   Discussed patient's case with Hospitalist, Dr. Blaine Hamper to request admission. Patient and family (if present) updated with plan. Care transferred to Hospitalist service.  I reviewed all nursing notes, vitals, pertinent old records, EKGs, labs, imaging (as available).  ____________________________________________  FINAL CLINICAL IMPRESSION(S) / ED DIAGNOSES  Final diagnoses:  Syncope and collapse  Precordial chest pain  Fall     MEDICATIONS GIVEN DURING THIS VISIT:  Medications  amLODipine (NORVASC) tablet 7.5 mg (7.5 mg Oral Given 01/08/18 0228)  aspirin EC tablet 81 mg (not administered)  citalopram (CELEXA) tablet 10 mg (not administered)  clopidogrel (PLAVIX) tablet 75 mg (not administered)  ezetimibe (ZETIA) tablet 10 mg (not administered)  feeding supplement (ENSURE ENLIVE) (ENSURE ENLIVE) liquid 237 mL (not administered)  losartan (COZAAR) tablet 100 mg (not  administered)  metoprolol tartrate (LOPRESSOR) tablet 75 mg (0 mg Oral Hold 01/08/18 0229)  multivitamin with minerals tablet 1 tablet (not administered)  omega-3 acid ethyl esters (LOVAZA) capsule 1 g (not administered)  pantoprazole (PROTONIX) EC tablet 40 mg (40 mg Oral Given 01/08/18 0228)  timolol (TIMOPTIC) 0.5 % ophthalmic solution 1 drop (not administered)  oxyCODONE-acetaminophen (PERCOCET/ROXICET) 5-325 MG per tablet 1 tablet (1 tablet Oral Given 01/08/18 0054)  nitroGLYCERIN (NITROSTAT) SL tablet 0.4 mg (not administered)  0.9 %  sodium chloride infusion ( Intravenous New Bag/Given 01/08/18 0228)  sodium chloride flush (NS) 0.9 % injection 3 mL (not administered)  enoxaparin (LOVENOX) injection 40 mg (not administered)  acetaminophen (TYLENOL) tablet 650 mg (not administered)    Or  acetaminophen (TYLENOL) suppository 650 mg (not administered)  polyethylene glycol (MIRALAX / GLYCOLAX) packet 17 g (not administered)  ondansetron (ZOFRAN) tablet 4 mg (not administered)    Or  ondansetron (ZOFRAN) injection 4 mg (not administered)  zolpidem (AMBIEN) tablet 5 mg (not administered)  hydrALAZINE (APRESOLINE) injection 5 mg (not administered)  NON FORMULARY 2 mg (not administered)    Note:  This document was prepared using Dragon voice recognition software and may include unintentional dictation errors.  Nanda Quinton, MD Emergency Medicine   Leeanna Slaby, [facility], MD 01/08/18 [phone]

## 2018-01-07 NOTE — ED Triage Notes (Signed)
To room via EMS.  Pt was standing at sink, woke up on floor, crawled to bedroom and called daughter.  Pt c/o chest pain when woke up on floor.

## 2018-01-07 NOTE — H&P (Signed)
History and Physical    Julie Bass:[id number] DOB: 12-16-1925 DOA: 01/07/2018  Referring MD/NP/PA:   PCP: Denita Lung, MD   Patient coming from:  The patient is coming from home.  At baseline, pt is independent for most of ADL.   Chief Complaint: syncope and fall  HPI: Julie Bass is a 84 y.o. female with medical history significant of hypertension, hyperlipidemia, GERD, depression, CK-3, PVD, NSVT, Loop recorder placement, dementia and syncope, who presents with syncope and fall.  Per her daughter,  The patient was standing at the sink when she suddenly lost consciousness and fell at about 7:00 PM. The syncopal event was not witnessed by family. After these events, patient started having chest pain. It is located in the frontal chest, constant, sharp, moderate, nonradiating. She also has chest wall tenderness on palpation. Patient denies shortness rest, fever or chills. She has some mild dry cough. She also has right shoulder pain. Patient denies nausea, vomiting, diarrhea, abdominal pain, symptoms of UTI or unilateral weakness. She does have a [employer] loop recorder placed previously for NSVT and syncope..   ED Course: pt was found to have WBC 10.7, negative urinalysis, stable renal function, temperature normal, bradycardia, oxygen saturation section 100% on room air, negative chest x-ray, CT head is negative for acute intracranial abnormalities, right shoulder x-rays negative for bony fracture. Patient is placed on telemetry bed for observation.  Review of Systems:   General: no fevers, chills, no body weight gain, has fatigue HEENT: no blurry vision, hearing changes or sore throat Respiratory: no dyspnea, has coughing, no wheezing CV: has chest pain, no palpitations GI: no nausea, vomiting, abdominal pain, diarrhea, constipation GU: no dysuria, burning on urination, increased urinary frequency, hematuria  Ext: no leg edema Neuro: no unilateral weakness, numbness, or  tingling, no vision change or hearing loss. Had syncope and fall. Skin: no rash, no skin tear. MSK: No muscle spasm, no deformity, no limitation of range of movement in spin Heme: No easy bruising.  Travel history: No recent long distant travel.  Allergy:  Allergies  Allergen Reactions  . Crestor [Rosuvastatin Calcium]     Muscle pain  . Lipitor [Atorvastatin Calcium]     Muscle pain    Past Medical History:  Diagnosis Date  . Arthritis   . Chronic kidney disease    KIDNEY STONES  . Dementia    BEGINNING STAGES   . Depression   . GERD (gastroesophageal reflux disease)   . Glaucoma   . Hiatal hernia   . HTN (hypertension) 10/18/2011  . Hyperlipemia 10/18/2011  . NSVT (nonsustained ventricular tachycardia) ([place]) 10/18/2011  . Osteoporosis   . PAD (peripheral artery disease) ([place]) 10/18/2011   h/o left SFA stent    Past Surgical History:  Procedure Laterality Date  . ABDOMINAL HYSTERECTOMY  1973  . CARDIAC CATHETERIZATION     2012  . CHOLECYSTECTOMY    . LOOP RECORDER IMPLANT N/A 08/27/2014   Procedure: LOOP RECORDER IMPLANT;  Surgeon: Coralyn Mark, MD;  Location: [facility] CATH [facility];  Service: Cardiovascular;  Laterality: N/A;  . LOWER EXTREMITY ANGIOGRAM  11/02/2007   stent of mid left SFA with 6x185mm EV3 self-expanding stent and 6x3 in prox region (Dr. Adora Fridge)  . LOWER EXTREMITY ANGIOGRAM    . LOWER EXTREMITY ARTERIAL DOPPLER  2013   right SFA with 50-69% diameter reduction, right PTA/peroneal occluded, L SFA prox to stent has narrowing with increased velocities >60% diameter reduction, L SFA stent patent, L  ATA w/occlusive disease, bilat ABIs show mild arterial insuffiency at rest  . NM MYOCAR PERF WALL MOTION  10/2011   non-gated - normal stuy, EF 82%, normal LV wall motion  . REVERSE SHOULDER ARTHROPLASTY Right 01/26/2013   Procedure: RIGHT REVERSE TOTAL SHOULDER ARTHROPLASTY;  Surgeon: Augustin Schooling, MD;  Location: [facility];  Service: Orthopedics;  Laterality: Right;  .  TRANSTHORACIC ECHOCARDIOGRAM  10/2012   EF 75-70%, mod conc hypertrophy, severely calcified MV annulus, LA mildly dailted, PA peak pressure 99mmHg    Social History:  reports that she quit smoking about 33 years ago. she has never used smokeless tobacco. She reports that she does not drink alcohol or use drugs.  Family History:  Family History  Problem Relation Age of Onset  . Throat cancer Father   . Cancer Mother   . CAD Son   . Breast cancer Sister        cervical cancer  . Colon cancer Brother        throat cancer  . Pulmonary embolism Daughter   . Hypertension Daughter      Prior to Admission medications   Medication Sig Start Date End Date Taking? Authorizing Provider  amLODipine (NORVASC) 5 MG tablet TAKE 1.5 TABLETS (7.5 MG TOTAL) BY MOUTH AT BEDTIME. 08/11/17  Yes Troy Sine, MD  aspirin EC 81 MG tablet Take 81 mg by mouth every evening.    Yes [provider]  citalopram (CELEXA) 10 MG tablet Take 1 tablet (10 mg total) by mouth daily. Patient taking differently: Take 10 mg by mouth at bedtime.  09/12/17  Yes Troy Sine, MD  clopidogrel (PLAVIX) 75 MG tablet TAKE 1 TABLET BY MOUTH DAILY 04/27/17  Yes Troy Sine, MD  ezetimibe (ZETIA) 10 MG tablet Take 1 tablet (10 mg total) by mouth daily. NEED OV. 12/02/17  Yes Troy Sine, MD  feeding supplement, ENSURE COMPLETE, (ENSURE COMPLETE) LIQD Take 237 mLs by mouth 3 (three) times daily between meals. 08/27/14  Yes Barrett, Rhonda G, PA-C  LIVALO 2 MG TABS TAKE 1 TABLET (2 MG TOTAL) BY MOUTH DAILY. 05/16/17  Yes Troy Sine, MD  losartan (COZAAR) 100 MG tablet TAKE 1 TABLET BY MOUTH DAILY 04/26/17  Yes Troy Sine, MD  metoprolol tartrate (LOPRESSOR) 50 MG tablet Take 1.5 tablets twice a day Patient taking differently: Take 75 mg by mouth 2 (two) times daily.  04/07/17  Yes Troy Sine, MD  Multiple Vitamins-Minerals (SENIOR MULTIVITAMIN PLUS PO) Take 1 tablet by mouth daily.   Yes [provider]  omega-3 acid ethyl esters (LOVAZA) 1 g capsule Take 1 capsule (1 g total) by mouth every evening. 07/07/17  Yes Troy Sine, MD  pantoprazole (PROTONIX) 40 MG tablet Take 1 tablet (40 mg total) by mouth 2 (two) times daily. Patient taking differently: Take 40 mg by mouth daily as needed (for acid reflux).  02/09/16  Yes Troy Sine, MD  timolol (TIMOPTIC) 0.5 % ophthalmic solution Place 1 drop into both eyes 2 (two) times daily.    Yes [provider]    Physical Exam: Vitals:   01/07/18 2215 01/08/18 0030 01/08/18 0133 01/08/18 0140  BP: (!) 173/71 (!) 162/59 (!) 171/65   Pulse: (!) 58 (!) 55 62   Resp: 15 12 14    Temp:   97.8 F (36.6 C)   TempSrc:   Oral   SpO2: 98% 97% 98%   Weight:    54.3  kg (119 lb 12.8 oz)   General: Not in acute distress HEENT:       Eyes: PERRL, EOMI, no scleral icterus.       ENT: No discharge from the ears and nose, no pharynx injection, no tonsillar enlargement.        Neck: No JVD, no bruit, no mass felt. Heme: No neck lymph node enlargement. Cardiac: S1/S2, RRR, No murmurs, No gallops or rubs. Respiratory: No rales, wheezing, rhonchi or rubs. Chest wall: Patient has reproducible frond chest wall tenderness GI: Soft, nondistended, nontender, no rebound pain, no organomegaly, BS present. GU: No hematuria Ext: No pitting leg edema bilaterally. 2+DP/PT pulse bilaterally. Musculoskeletal: No joint deformities, No joint redness or warmth, no limitation of ROM in spin. Skin: No rashes.  Neuro: Alert, oriented X3, cranial nerves II-XII grossly intact, moves all extremities normally. Muscle strength 5/5 in all extremities, sensation to light touch intact. Brachial reflex 2+ bilaterally. Psych: Patient is not psychotic, no suicidal or hemocidal ideation.  Labs on Admission: I have personally reviewed following labs and imaging studies  CBC: Recent Labs  Lab 01/07/18 2115  WBC 10.7*  HGB 16.4*  HCT 49.6*  MCV 96.3  PLT  297*   Basic Metabolic Panel: Recent Labs  Lab 01/07/18 2115  NA 139  K 4.6  CL 103  CO2 25  GLUCOSE 118*  BUN 14  CREATININE 1.20*  CALCIUM 10.2   GFR: Estimated Creatinine Clearance: 22.8 mL/min (A) (by C-G formula based on SCr of 1.2 mg/dL (H)). Liver Function Tests: No results for input(s): AST, ALT, ALKPHOS, BILITOT, PROT, ALBUMIN in the last 168 hours. No results for input(s): LIPASE, AMYLASE in the last 168 hours. No results for input(s): AMMONIA in the last 168 hours. Coagulation Profile: No results for input(s): INR, PROTIME in the last 168 hours. Cardiac Enzymes: Recent Labs  Lab 01/08/18 0045  TROPONINI <0.03   BNP (last 3 results) No results for input(s): PROBNP in the last 8760 hours. HbA1C: No results for input(s): HGBA1C in the last 72 hours. CBG: Recent Labs  Lab 01/07/18 2120  GLUCAP 115*   Lipid Profile: No results for input(s): CHOL, HDL, LDLCALC, TRIG, CHOLHDL, LDLDIRECT in the last 72 hours. Thyroid Function Tests: No results for input(s): TSH, T4TOTAL, FREET4, T3FREE, THYROIDAB in the last 72 hours. Anemia Panel: No results for input(s): VITAMINB12, FOLATE, FERRITIN, TIBC, IRON, RETICCTPCT in the last 72 hours. Urine analysis:    Component Value Date/Time   COLORURINE STRAW (A) 01/07/2018 2144   APPEARANCEUR CLEAR 01/07/2018 2144   LABSPEC 1.006 01/07/2018 2144   PHURINE 7.0 01/07/2018 2144   GLUCOSEU NEGATIVE 01/07/2018 2144   HGBUR MODERATE (A) 01/07/2018 2144   BILIRUBINUR NEGATIVE 01/07/2018 2144   BILIRUBINUR n 11/25/2016 1409   KETONESUR 5 (A) 01/07/2018 2144   PROTEINUR NEGATIVE 01/07/2018 2144   UROBILINOGEN negative 11/25/2016 1409   UROBILINOGEN 1 10/08/2014 1529   NITRITE NEGATIVE 01/07/2018 2144   LEUKOCYTESUR NEGATIVE 01/07/2018 2144   Sepsis Labs: @LABRCNTIP (procalcitonin:4,lacticidven:4) )No results found for this or any previous visit (from the past 240 hour(s)).   Radiological Exams on Admission: Dg Chest 2  View  Result Date: 01/07/2018 CLINICAL DATA:  Chest pain EXAM: CHEST  2 VIEW COMPARISON:  06/13/2016 FINDINGS: Partially visualized right shoulder replacement. No pleural effusion. Borderline to mild cardiomegaly. No focal consolidation or effusion. Aortic atherosclerosis. No pneumothorax. Thoracic compression deformities of the mid to lower spine. IMPRESSION: No active cardiopulmonary disease.  Borderline to mild cardiomegaly. Electronically Signed  By: Donavan Foil M.D.   On: 01/07/2018 21:10   Dg Shoulder Right  Result Date: 01/08/2018 CLINICAL DATA:  Right shoulder pain EXAM: RIGHT SHOULDER - 2+ VIEW COMPARISON:  08/23/2014 FINDINGS: Status post reverse right shoulder replacement with intact hardware. Normal alignment. No obvious fracture. IMPRESSION: Status post right shoulder replacement. No definite acute osseous abnormality Electronically Signed   By: Donavan Foil M.D.   On: 01/08/2018 00:52   Ct Head Wo Contrast  Result Date: 01/08/2018 CLINICAL DATA:  84 year old female with syncope. EXAM: CT HEAD WITHOUT CONTRAST TECHNIQUE: Contiguous axial images were obtained from the base of the skull through the vertex without intravenous contrast. COMPARISON:  Head CT dated 02/10/2015 FINDINGS: Brain: There is moderate to advanced age-related atrophy and chronic microvascular ischemic changes. There is no acute intracranial hemorrhage. No mass effect or midline shift. Dilated extra-axial/subdural spaces with low attenuating fluid likely represent old subdural hematoma or hygroma and measure approximately 15 mm over the right frontal lobe. Vascular: No hyperdense vessel or unexpected calcification. Skull: Normal. Negative for fracture or focal lesion. Sinuses/Orbits: Mild mucoperiosteal thickening of paranasal sinuses. No air-fluid level. Mastoid air cells are clear. Other: None IMPRESSION: 1. No acute intracranial hemorrhage. 2. Moderate severe age-related atrophy and chronic microvascular ischemic  changes. 3. Dilated extra-axial spaces likely representing old subdural hematoma or hygroma. Electronically Signed   By: Anner Crete M.D.   On: 01/08/2018 00:48     EKG: Independently reviewed.  Sinus rhythm, QTC 447, mild ST depression in V5-V6  Assessment/Plan Principal Problem:   Syncope Active Problems:   Fall   NSVT (nonsustained ventricular tachycardia) (HCC)   HTN (hypertension)   HLD (hyperlipidemia)   Dementia   Chest pain   CKD (chronic kidney disease), stage III (HCC)   GERD (gastroesophageal reflux disease)   Syncope and fall: Etiology is not clear. CT head is negative for acute intracranial abnormalities. Potential differential diagnoses include cardiac arrhythmia given history of NSVT, ACS given CP, vasovagal syncope, orthostatic status, carotid artery stenosis. Less likely to have stroke since patient does not have focal neurologic deficit.  - Please on tele bed for obs - Orthostatic vital signs  - Carotid doppler - 2d echo - Neuro checks  - IVF: NS 75 cc/h - PT/OT eval and treat - EDP has requested [employer] representative to interrogate loop recorder - Inpatient non-urgent card consult order was put in Epic  Chest pain: Likely due to musculoskeletal pain since pt has reproducible chest wall tenderness, but given her syncopal event, will do chest pain throughout work up. - cycle CE q6 x3 and repeat EKG in the am  - prn Nitroglycerin, percocet, and aspirin, plavix, Zetia, Pitavastatin  - Risk factor stratification: will check FLP and A1C  - 2d echo  NSVT (nonsustained ventricular tachycardia) ([place]): Hr 57 now. -tele mornintoring  HLD: -Zetia, Pitavastatin   HTN:  -Continue home medications: Amlodipine, metoprolol, losartan -IV hydralazine prn  CKD (chronic kidney disease), stage III ([place]): Stable. Baseline creatinine 1.0-1.2. Her creatinine is 1.20, bili 14. -f/u renal Fx by BMP  GERD: -Protonix    DVT ppx:  SQ Lovenox Code Status: Full  code Family Communication:   Yes, patient's  daughter and the son-in-law  at bed side Disposition Plan:  Anticipate discharge back to previous home environment Consults called:  none Admission status: Obs / tele    Date of Service 01/08/2018    Ivor Costa [facility] Pager [phone]  If 7PM-7AM, please contact night-coverage [email or web address] Password  [email or web address] 01/08/2018, 3:25 AM

## 2018-01-08 ENCOUNTER — Observation Stay (HOSPITAL_COMMUNITY): Payer: Medicare Other

## 2018-01-08 ENCOUNTER — Other Ambulatory Visit: Payer: Self-pay

## 2018-01-08 DIAGNOSIS — F0390 Unspecified dementia without behavioral disturbance: Secondary | ICD-10-CM | POA: Diagnosis not present

## 2018-01-08 DIAGNOSIS — I10 Essential (primary) hypertension: Secondary | ICD-10-CM

## 2018-01-08 DIAGNOSIS — I739 Peripheral vascular disease, unspecified: Secondary | ICD-10-CM

## 2018-01-08 DIAGNOSIS — E785 Hyperlipidemia, unspecified: Secondary | ICD-10-CM

## 2018-01-08 DIAGNOSIS — I459 Conduction disorder, unspecified: Secondary | ICD-10-CM | POA: Diagnosis not present

## 2018-01-08 DIAGNOSIS — R072 Precordial pain: Secondary | ICD-10-CM

## 2018-01-08 DIAGNOSIS — Z950 Presence of cardiac pacemaker: Secondary | ICD-10-CM | POA: Diagnosis not present

## 2018-01-08 DIAGNOSIS — R55 Syncope and collapse: Secondary | ICD-10-CM

## 2018-01-08 DIAGNOSIS — K219 Gastro-esophageal reflux disease without esophagitis: Secondary | ICD-10-CM | POA: Diagnosis not present

## 2018-01-08 DIAGNOSIS — R079 Chest pain, unspecified: Secondary | ICD-10-CM | POA: Diagnosis not present

## 2018-01-08 DIAGNOSIS — N183 Chronic kidney disease, stage 3 (moderate): Secondary | ICD-10-CM | POA: Diagnosis not present

## 2018-01-08 DIAGNOSIS — I472 Ventricular tachycardia: Secondary | ICD-10-CM

## 2018-01-08 DIAGNOSIS — W19XXXA Unspecified fall, initial encounter: Secondary | ICD-10-CM

## 2018-01-08 DIAGNOSIS — M25511 Pain in right shoulder: Secondary | ICD-10-CM | POA: Diagnosis not present

## 2018-01-08 LAB — CBC
HCT: 45.6 % (ref 36.0–46.0)
Hemoglobin: 14.7 g/dL (ref 12.0–15.0)
MCH: 30.8 pg (ref 26.0–34.0)
MCHC: 32.2 g/dL (ref 30.0–36.0)
MCV: 95.4 fL (ref 78.0–100.0)
Platelets: 160 10*3/uL (ref 150–400)
RBC: 4.78 MIL/uL (ref 3.87–5.11)
RDW: 12.8 % (ref 11.5–15.5)
WBC: 9.1 10*3/uL (ref 4.0–10.5)

## 2018-01-08 LAB — LIPID PANEL
Cholesterol: 156 mg/dL (ref 0–200)
HDL: 70 mg/dL (ref >40)
LDL Cholesterol: 71 mg/dL (ref 0–99)
Total CHOL/HDL Ratio: 2.2 RATIO
Triglycerides: 75 mg/dL (ref <150)
VLDL: 15 mg/dL (ref 0–40)

## 2018-01-08 LAB — BASIC METABOLIC PANEL
Anion gap: 13 (ref 5–15)
BUN: 15 mg/dL (ref 6–20)
CO2: 22 mmol/L (ref 22–32)
Calcium: 9.4 mg/dL (ref 8.9–10.3)
Chloride: 102 mmol/L (ref 101–111)
Creatinine, Ser: 1.13 mg/dL — ABNORMAL HIGH (ref 0.44–1.00)
GFR calc Af Amer: 48 mL/min — ABNORMAL LOW (ref >60)
GFR calc non Af Amer: 42 mL/min — ABNORMAL LOW (ref >60)
Glucose, Bld: 113 mg/dL — ABNORMAL HIGH (ref 65–99)
Potassium: 4.1 mmol/L (ref 3.5–5.1)
Sodium: 137 mmol/L (ref 135–145)

## 2018-01-08 LAB — GLUCOSE, CAPILLARY: Glucose-Capillary: 107 mg/dL — ABNORMAL HIGH (ref 65–99)

## 2018-01-08 LAB — TROPONIN I
Troponin I: <0.03 ng/mL (ref <0.03)
Troponin I: <0.03 ng/mL (ref <0.03)
Troponin I: <0.03 ng/mL (ref <0.03)

## 2018-01-08 MED ORDER — ACETAMINOPHEN 325 MG PO TABS: 650.0000 mg | ORAL_TABLET | Freq: Four times a day (QID) | ORAL | Status: DC | PRN

## 2018-01-08 MED ORDER — SODIUM CHLORIDE 0.9 % IV SOLN
INTRAVENOUS | Status: DC
Start: 2018-01-08 — End: 2018-01-10
  Administered 2018-01-08 – 2018-01-09 (×3): via INTRAVENOUS

## 2018-01-08 MED ORDER — ASPIRIN EC 81 MG PO TBEC
81.0000 mg | DELAYED_RELEASE_TABLET | Freq: Every evening | ORAL | Status: DC
  Administered 2018-01-08 – 2018-01-09 (×2): 81 mg via ORAL
  Filled 2018-01-08 (×2): qty 1

## 2018-01-08 MED ORDER — ENSURE ENLIVE PO LIQD
237.0000 mL | Freq: Three times a day (TID) | ORAL | Status: DC
  Administered 2018-01-08 – 2018-01-10 (×6): 237 mL via ORAL

## 2018-01-08 MED ORDER — ZOLPIDEM TARTRATE 5 MG PO TABS: 5.0000 mg | ORAL_TABLET | Freq: Every evening | ORAL | Status: DC | PRN

## 2018-01-08 MED ORDER — AMLODIPINE BESYLATE 5 MG PO TABS
7.5000 mg | ORAL_TABLET | Freq: Every day | ORAL | Status: DC
  Administered 2018-01-08 – 2018-01-09 (×3): 7.5 mg via ORAL
  Filled 2018-01-08 (×3): qty 1

## 2018-01-08 MED ORDER — ONDANSETRON HCL 4 MG PO TABS: 4.0000 mg | ORAL_TABLET | Freq: Four times a day (QID) | ORAL | Status: DC | PRN

## 2018-01-08 MED ORDER — NITROGLYCERIN 0.4 MG SL SUBL: 0.4000 mg | SUBLINGUAL_TABLET | SUBLINGUAL | Status: DC | PRN

## 2018-01-08 MED ORDER — TIMOLOL MALEATE 0.5 % OP SOLN
1.0000 [drp] | Freq: Two times a day (BID) | OPHTHALMIC | Status: DC
  Administered 2018-01-08 – 2018-01-10 (×4): 1 [drp] via OPHTHALMIC
  Filled 2018-01-08: qty 5

## 2018-01-08 MED ORDER — METOPROLOL TARTRATE 50 MG PO TABS
75.0000 mg | ORAL_TABLET | Freq: Two times a day (BID) | ORAL | Status: DC
  Administered 2018-01-08: 75 mg via ORAL
  Filled 2018-01-08 (×2): qty 1

## 2018-01-08 MED ORDER — HYDRALAZINE HCL 20 MG/ML IJ SOLN: 5.0000 mg | INTRAMUSCULAR | Status: DC | PRN

## 2018-01-08 MED ORDER — ADULT MULTIVITAMIN W/MINERALS CH
1.0000 | ORAL_TABLET | Freq: Every day | ORAL | Status: DC
  Administered 2018-01-08 – 2018-01-10 (×3): 1 via ORAL
  Filled 2018-01-08 (×3): qty 1

## 2018-01-08 MED ORDER — ENOXAPARIN SODIUM 40 MG/0.4ML ~~LOC~~ SOLN
40.0000 mg | SUBCUTANEOUS | Status: DC
  Administered 2018-01-08: 40 mg via SUBCUTANEOUS
  Filled 2018-01-08 (×2): qty 0.4

## 2018-01-08 MED ORDER — OMEGA-3-ACID ETHYL ESTERS 1 G PO CAPS
1.0000 g | ORAL_CAPSULE | Freq: Every evening | ORAL | Status: DC
  Administered 2018-01-08 – 2018-01-09 (×2): 1 g via ORAL
  Filled 2018-01-08 (×2): qty 1

## 2018-01-08 MED ORDER — PITAVASTATIN CALCIUM 2 MG PO TABS
2.0000 mg | ORAL_TABLET | Freq: Every day | ORAL | Status: DC
  Administered 2018-01-08 – 2018-01-10 (×2): 2 mg via ORAL
  Filled 2018-01-08 (×3): qty 1

## 2018-01-08 MED ORDER — NON FORMULARY: 2.0000 mg | Freq: Every day | Status: DC

## 2018-01-08 MED ORDER — DICLOFENAC SODIUM 1 % TD GEL
2.0000 g | Freq: Four times a day (QID) | TRANSDERMAL | Status: DC
  Administered 2018-01-08 – 2018-01-10 (×7): 2 g via TOPICAL
  Filled 2018-01-08: qty 100

## 2018-01-08 MED ORDER — OXYCODONE-ACETAMINOPHEN 5-325 MG PO TABS
1.0000 | ORAL_TABLET | ORAL | Status: DC | PRN
  Administered 2018-01-08 (×3): 1 via ORAL
  Filled 2018-01-08 (×3): qty 1

## 2018-01-08 MED ORDER — EZETIMIBE 10 MG PO TABS
10.0000 mg | ORAL_TABLET | Freq: Every day | ORAL | Status: DC
  Administered 2018-01-08 – 2018-01-10 (×3): 10 mg via ORAL
  Filled 2018-01-08 (×3): qty 1

## 2018-01-08 MED ORDER — ONDANSETRON HCL 4 MG/2ML IJ SOLN: 4.0000 mg | Freq: Four times a day (QID) | INTRAMUSCULAR | Status: DC | PRN

## 2018-01-08 MED ORDER — CITALOPRAM HYDROBROMIDE 10 MG PO TABS
10.0000 mg | ORAL_TABLET | Freq: Every day | ORAL | Status: DC
  Administered 2018-01-08 – 2018-01-10 (×3): 10 mg via ORAL
  Filled 2018-01-08 (×4): qty 1

## 2018-01-08 MED ORDER — ACETAMINOPHEN 650 MG RE SUPP: 650.0000 mg | Freq: Four times a day (QID) | RECTAL | Status: DC | PRN

## 2018-01-08 MED ORDER — PANTOPRAZOLE SODIUM 40 MG PO TBEC
40.0000 mg | DELAYED_RELEASE_TABLET | Freq: Two times a day (BID) | ORAL | Status: DC
  Administered 2018-01-08 – 2018-01-10 (×6): 40 mg via ORAL
  Filled 2018-01-08 (×6): qty 1

## 2018-01-08 MED ORDER — LOSARTAN POTASSIUM 50 MG PO TABS
100.0000 mg | ORAL_TABLET | Freq: Every day | ORAL | Status: DC
  Administered 2018-01-08 – 2018-01-10 (×3): 100 mg via ORAL
  Filled 2018-01-08 (×3): qty 2

## 2018-01-08 MED ORDER — CLOPIDOGREL BISULFATE 75 MG PO TABS
75.0000 mg | ORAL_TABLET | Freq: Every day | ORAL | Status: DC
  Administered 2018-01-08 – 2018-01-10 (×3): 75 mg via ORAL
  Filled 2018-01-08 (×3): qty 1

## 2018-01-08 MED ORDER — POLYETHYLENE GLYCOL 3350 17 G PO PACK: 17.0000 g | PACK | Freq: Every day | ORAL | Status: DC | PRN

## 2018-01-08 MED ORDER — SODIUM CHLORIDE 0.9% FLUSH
3.0000 mL | Freq: Two times a day (BID) | INTRAVENOUS | Status: DC
  Administered 2018-01-08 – 2018-01-10 (×4): 3 mL via INTRAVENOUS

## 2018-01-08 NOTE — Progress Notes (Signed)
Device check showed 30 sec pause at time of syncope with some pacing. Also had 19 beats of VT Jan 2018. Will hold BB. EP will see in AM with plan for possible pacemaker.

## 2018-01-08 NOTE — Progress Notes (Signed)
Pitavastatin medication sent to pharmacy. Daugher, [name] at bedside and knows pt is to have medication returned prior to discharge.

## 2018-01-08 NOTE — Progress Notes (Signed)
Patient to floor with family at bedside.  Patient dx dementia knows where she is and why.  Told this Probation officer she lives home with grandson.  Cares for self and takes meds that daughter prepares in med box.  Daughter stated mom does miss doses and does not take blood pressure at home.  Patient told this writer she just blacked out at kitchen sink crawled to room to call daughter.  Family states mom has not fallin for many years. Bed at lowest postion bed alarm activated.  Area on left neck daughter states changes in size says this has been the largest she has seen. On call notified about metoprolol parameters pt hr-62, orders given to hold for less than 70, med held.  Pt up to [facility] one assist has urgency.  Pt stable.

## 2018-01-08 NOTE — Progress Notes (Signed)
During rounding central tele informed this Probation officer and on coming nurse of 9 beat run vtach.  Patient stable no signs distress noted patient was resting when this Probation officer and on coming nurse entered room.  On coming nurse to inform MD during morning rounding.  This Probation officer contacted daughter of patient to let her know hospital does not carry moms cholestorol medication and to bring it from home. Daughter was Patent attorney and Medical sales representative.

## 2018-01-08 NOTE — Progress Notes (Signed)
PROGRESS NOTE    [name]  BJS:[id number] DOB: [birth year] DOA: 01/07/2018 PCP: Denita Lung, MD   Brief Narrative:  HPI on 01/07/2018 by Dr. Ivor Costa Julie Bass is a 84 y.o. female with medical history significant of hypertension, hyperlipidemia, GERD, depression, CK-3, PVD, NSVT, Loop recorder placement, dementia and syncope, who presents with syncope and fall.  Per her daughter, The patient was standing at the sink when she suddenly lost consciousness and fell at about 7:00 PM. The syncopal event was not witnessed by family. After these events, patient started having chest pain. It is located in the frontal chest, constant, sharp, moderate, nonradiating. She also has chest wall tenderness on palpation. Patient denies shortness rest, fever or chills. She has some mild dry cough. She also has right shoulder pain. Patient denies nausea, vomiting, diarrhea, abdominal pain, symptoms of UTI or unilateral weakness.She does have a [employer] loop recorder placed previously for NSVT and syncope.  Assessment & Plan   Syncope and fall -Etiology likely secondary to sinus pauses -CT head unremarkable for acute endocrine abnormalities -Patient does have a history of NSVT, vasovagal syncope and carotid artery stenosis -Cardiology consulted and appreciated -Echocardiogram pending -PT Consulted, and pending -OT recommended SNF -Patient does have a loop recorder in place, interrogation showed 30 second pause at time of syncope with pacing.  Patient also noted to have 19 beats of VT back in January 2018  Chest pain -Likely musculoskeletal secondary to patient's fall -Reproducible on palpation -Troponin cycled and unremarkable x3 -As above echocardiogram pending -will add voltaren   NSVT -Patient has had NSVT since 2015, also has a loop recorder which has been interrogated, results listed above -Metoprolol currently held -Patient did have 9 beats of nonsustained V. tach this  morning  Hyperlipidemia -Continue statin, Zetia -Lipid panel showed TC 156, HDL 70, LDL 71, triglycerides 75  Essential hypertension -continue amlodipine, losartan -Metoprolol held due to pause found on interrogation of loop recorder  Chronic kidney disease, stage III -Creatinine currently at baseline, continue to monitor BMP  GERD  -Continue PPI  PVD -Continue aspirin, Plavix  DVT Prophylaxis Lovenox  Code Status: Full  Family Communication: None at bedside  Disposition Plan: Observation, pending cardiology recommendations/PT OT consultations  Consultants Cardiology  Procedures  None  Antibiotics   Anti-infectives (From admission, onward)   None      Subjective:   Julie Bass seen and examined today.  Patient currently complains of chest pain which worsens with movement.  Denies current abdominal pain shortness of breath, nausea vomiting, diarrhea constipation, headache or dizziness.  Remembers falling yesterday however does not recall whether she was dizzy or lost consciousness.  Objective:   Vitals:   01/08/18 0140 01/08/18 0555 01/08/18 0900 01/08/18 1100  BP:  (!) 153/49 (!) 160/54 (!) 128/45  Pulse:   66 61  Resp:   16 20  Temp:  97.6 F (36.4 C) 98.6 F (37 C) 98.1 F (36.7 C)  TempSrc:  Oral Oral Oral  SpO2:  98% 97% 97%  Weight: 54.3 kg (119 lb 12.8 oz)       Intake/Output Summary (Last 24 hours) at 01/08/2018 1348 Last data filed at 01/08/2018 1036 Gross per 24 hour  Intake -  Output 600 ml  Net -600 ml   Filed Weights   01/08/18 0140  Weight: 54.3 kg (119 lb 12.8 oz)    Exam  General: Well developed, well nourished, NAD, appears stated age  HEENT: NCAT, PERRLA, EOMI, Anicteic  Sclera, mucous membranes moist.   Neck: Supple, no JVD, mass in the left side of her neck  Cardiovascular: S1 S2 auscultated, no rubs, murmurs or gallops. Regular rate and rhythm.  Respiratory: Clear to auscultation bilaterally with equal chest  rise  Abdomen: Soft, nontender, nondistended, + bowel sounds  Extremities: warm dry without cyanosis clubbing or edema  Neuro: AAOx3, nonfocal, normal strength  Skin: Without rashes exudates or nodules  Psych: Normal affect and demeanor with intact judgement and insight   Data Reviewed: I have personally reviewed following labs and imaging studies  CBC: Recent Labs  Lab 01/07/18 2115 01/08/18 0639  WBC 10.7* 9.1  HGB 16.4* 14.7  HCT 49.6* 45.6  MCV 96.3 95.4  PLT 147* 397   Basic Metabolic Panel: Recent Labs  Lab 01/07/18 2115 01/08/18 0639  NA 139 137  K 4.6 4.1  CL 103 102  CO2 25 22  GLUCOSE 118* 113*  BUN 14 15  CREATININE 1.20* 1.13*  CALCIUM 10.2 9.4   GFR: Estimated Creatinine Clearance: 24.2 mL/min (A) (by C-G formula based on SCr of 1.13 mg/dL (H)). Liver Function Tests: No results for input(s): AST, ALT, ALKPHOS, BILITOT, PROT, ALBUMIN in the last 168 hours. No results for input(s): LIPASE, AMYLASE in the last 168 hours. No results for input(s): AMMONIA in the last 168 hours. Coagulation Profile: No results for input(s): INR, PROTIME in the last 168 hours. Cardiac Enzymes: Recent Labs  Lab 01/08/18 0045 01/08/18 0639 01/08/18 1211  TROPONINI <0.03 <0.03 <0.03   BNP (last 3 results) No results for input(s): PROBNP in the last 8760 hours. HbA1C: No results for input(s): HGBA1C in the last 72 hours. CBG: Recent Labs  Lab 01/07/18 2120 01/08/18 0746  GLUCAP 115* 107*   Lipid Profile: Recent Labs    01/08/18 0639  CHOL 156  HDL 70  LDLCALC 71  TRIG 75  CHOLHDL 2.2   Thyroid Function Tests: No results for input(s): TSH, T4TOTAL, FREET4, T3FREE, THYROIDAB in the last 72 hours. Anemia Panel: No results for input(s): VITAMINB12, FOLATE, FERRITIN, TIBC, IRON, RETICCTPCT in the last 72 hours. Urine analysis:    Component Value Date/Time   COLORURINE STRAW (A) 01/07/2018 2144   APPEARANCEUR CLEAR 01/07/2018 2144   LABSPEC 1.006  01/07/2018 2144   PHURINE 7.0 01/07/2018 2144   GLUCOSEU NEGATIVE 01/07/2018 2144   HGBUR MODERATE (A) 01/07/2018 2144   BILIRUBINUR NEGATIVE 01/07/2018 2144   BILIRUBINUR n 11/25/2016 1409   KETONESUR 5 (A) 01/07/2018 2144   PROTEINUR NEGATIVE 01/07/2018 2144   UROBILINOGEN negative 11/25/2016 1409   UROBILINOGEN 1 10/08/2014 1529   NITRITE NEGATIVE 01/07/2018 2144   LEUKOCYTESUR NEGATIVE 01/07/2018 2144   Sepsis Labs: @LABRCNTIP (procalcitonin:4,lacticidven:4)  )No results found for this or any previous visit (from the past 240 hour(s)).    Radiology Studies: Dg Chest 2 View  Result Date: 01/07/2018 CLINICAL DATA:  Chest pain EXAM: CHEST  2 VIEW COMPARISON:  06/13/2016 FINDINGS: Partially visualized right shoulder replacement. No pleural effusion. Borderline to mild cardiomegaly. No focal consolidation or effusion. Aortic atherosclerosis. No pneumothorax. Thoracic compression deformities of the mid to lower spine. IMPRESSION: No active cardiopulmonary disease.  Borderline to mild cardiomegaly. Electronically Signed   By: Donavan Foil M.D.   On: 01/07/2018 21:10   Dg Shoulder Right  Result Date: 01/08/2018 CLINICAL DATA:  Right shoulder pain EXAM: RIGHT SHOULDER - 2+ VIEW COMPARISON:  08/23/2014 FINDINGS: Status post reverse right shoulder replacement with intact hardware. Normal alignment. No obvious fracture. IMPRESSION: Status  post right shoulder replacement. No definite acute osseous abnormality Electronically Signed   By: Donavan Foil M.D.   On: 01/08/2018 00:52   Ct Head Wo Contrast  Result Date: 01/08/2018 CLINICAL DATA:  84 year old female with syncope. EXAM: CT HEAD WITHOUT CONTRAST TECHNIQUE: Contiguous axial images were obtained from the base of the skull through the vertex without intravenous contrast. COMPARISON:  Head CT dated 02/10/2015 FINDINGS: Brain: There is moderate to advanced age-related atrophy and chronic microvascular ischemic changes. There is no acute  intracranial hemorrhage. No mass effect or midline shift. Dilated extra-axial/subdural spaces with low attenuating fluid likely represent old subdural hematoma or hygroma and measure approximately 15 mm over the right frontal lobe. Vascular: No hyperdense vessel or unexpected calcification. Skull: Normal. Negative for fracture or focal lesion. Sinuses/Orbits: Mild mucoperiosteal thickening of paranasal sinuses. No air-fluid level. Mastoid air cells are clear. Other: None IMPRESSION: 1. No acute intracranial hemorrhage. 2. Moderate severe age-related atrophy and chronic microvascular ischemic changes. 3. Dilated extra-axial spaces likely representing old subdural hematoma or hygroma. Electronically Signed   By: Anner Crete M.D.   On: 01/08/2018 00:48     Scheduled Meds: . amLODipine  7.5 mg Oral QHS  . aspirin EC  81 mg Oral QPM  . citalopram  10 mg Oral Daily  . clopidogrel  75 mg Oral Daily  . diclofenac sodium  2 g Topical QID  . enoxaparin (LOVENOX) injection  40 mg Subcutaneous Q24H  . ezetimibe  10 mg Oral Daily  . feeding supplement (ENSURE ENLIVE)  237 mL Oral TID BM  . losartan  100 mg Oral Daily  . multivitamin with minerals  1 tablet Oral Daily  . NON FORMULARY 2 mg  2 mg Oral Daily  . omega-3 acid ethyl esters  1 g Oral QPM  . pantoprazole  40 mg Oral BID  . sodium chloride flush  3 mL Intravenous Q12H  . timolol  1 drop Both Eyes BID   Continuous Infusions: . sodium chloride 75 mL/hr at 01/08/18 1326     LOS: 0 days   Time Spent in minutes   30 minutes  Saquoia Sianez D.O. on 01/08/2018 at 1:48 PM  Between 7am to 7pm - Pager - [phone]  After 7pm go to [email or web address] - password [email or web address]  And look for the night coverage person covering for me after hours  [facility] Office  [phone]

## 2018-01-08 NOTE — Progress Notes (Signed)
Nurse calling to give report on patient that had fall with no xray of injured area or note from MD.

## 2018-01-08 NOTE — Consult Note (Addendum)
Cardiology Consultation:   Patient ID: Julie Bass; [id number]; 02-Jan-1926   Admit date: 01/07/2018 Date of Consult: 01/08/2018  Primary Care Provider: Denita Lung, MD Primary Cardiologist: Shelva Majestic, MD   Patient Profile:   Julie Bass is a 84 y.o. female with a hx of HTN, HLD, PVD, NSVT on BB,  who is being seen today for the evaluation of syncope at the request of Dr. Ree Kida.  History of Present Illness:   Julie Bass tells me that Julie Bass was washing dishes at the sink last night after Julie Bass son had just left. Julie Bass turned around to head to the table and just went down. Julie Bass says that Julie Bass was not dizzy and had no chest pain or shortness of breath. Julie Bass denies loss of consciousness, says Julie Bass was fully aware the whole time. Julie Bass then crawled to Julie Bass bedroom to call Julie Bass Julie Bass who came and brought Julie Bass to the hospital. Julie Bass is complaining of right chest/shoulder pain that Julie Bass says has been constant since Julie Bass had surgery on that shoulder. Julie Bass denies any recent dizziness or near syncope.   Julie Bass was hospitalized in 08/2014 for syncope and was not orthostatic. Julie Bass did have 15 beat run of VT and a loop recorder was subsequently placed. Julie Bass has declined appt with Dr. Sallyanne Kuster in the past to discuss device placement. Julie Bass has underone neurologic evaluation with Dr. Corwin Levins and is felt to have moderate  dementia felt to be Alzheimer's  versus vascular dementia.  In 08/2016 Julie Bass was found to have an episode of irregular nonsustained WCT with max rate of 167 bpm that reverted back to sinus rhythm. Julie Bass was asymptomatic and this occurred while in bed. Julie Bass metoprolol dose was increased from 75 mg bid to 100 mg in the am and 75 mg in the pm.   Julie Bass was last seen in our office by Dr. Claiborne Billings on 04/07/2017 at which time Julie Bass was doing well with no complaints of near syncope/syncope. Julie Bass last interrogation of loop recorder on 11/11/17 showed normal device function with no tachy, brady or pause episodes. No AFib  episodes.   Past Medical History:  Diagnosis Date  . Arthritis   . Chronic kidney disease    KIDNEY STONES  . Dementia    BEGINNING STAGES   . Depression   . GERD (gastroesophageal reflux disease)   . Glaucoma   . Hiatal hernia   . HTN (hypertension) 10/18/2011  . Hyperlipemia 10/18/2011  . NSVT (nonsustained ventricular tachycardia) ([place]) 10/18/2011  . Osteoporosis   . PAD (peripheral artery disease) ([place]) 10/18/2011   h/o left SFA stent    Past Surgical History:  Procedure Laterality Date  . ABDOMINAL HYSTERECTOMY  1973  . CARDIAC CATHETERIZATION     2012  . CHOLECYSTECTOMY    . LOOP RECORDER IMPLANT N/A 08/27/2014   Procedure: LOOP RECORDER IMPLANT;  Surgeon: Coralyn Mark, MD;  Location: [facility];  Service: Cardiovascular;  Laterality: N/A;  . LOWER EXTREMITY ANGIOGRAM  11/02/2007   stent of mid left SFA with 6x13mm EV3 self-expanding stent and 6x3 in prox region (Dr. Adora Fridge)  . LOWER EXTREMITY ANGIOGRAM    . LOWER EXTREMITY ARTERIAL DOPPLER  2013   right SFA with 50-69% diameter reduction, right PTA/peroneal occluded, L SFA prox to stent has narrowing with increased velocities >60% diameter reduction, L SFA stent patent, L ATA w/occlusive disease, bilat ABIs show mild arterial insuffiency at rest  . NM MYOCAR PERF WALL MOTION  10/2011   non-gated -  normal stuy, EF 82%, normal LV wall motion  . REVERSE SHOULDER ARTHROPLASTY Right 01/26/2013   Procedure: RIGHT REVERSE TOTAL SHOULDER ARTHROPLASTY;  Surgeon: Augustin Schooling, MD;  Location: [facility];  Service: Orthopedics;  Laterality: Right;  . TRANSTHORACIC ECHOCARDIOGRAM  10/2012   EF 75-70%, mod conc hypertrophy, severely calcified MV annulus, LA mildly dailted, PA peak pressure 58mmHg     Home Medications:  Prior to Admission medications   Medication Sig Start Date End Date Taking? Authorizing Provider  amLODipine (NORVASC) 5 MG tablet TAKE 1.5 TABLETS (7.5 MG TOTAL) BY MOUTH AT BEDTIME. 08/11/17  Yes Troy Sine,  MD  aspirin EC 81 MG tablet Take 81 mg by mouth every evening.    Yes [provider]  citalopram (CELEXA) 10 MG tablet Take 1 tablet (10 mg total) by mouth daily. Patient taking differently: Take 10 mg by mouth at bedtime.  09/12/17  Yes Troy Sine, MD  clopidogrel (PLAVIX) 75 MG tablet TAKE 1 TABLET BY MOUTH DAILY 04/27/17  Yes Troy Sine, MD  ezetimibe (ZETIA) 10 MG tablet Take 1 tablet (10 mg total) by mouth daily. NEED OV. 12/02/17  Yes Troy Sine, MD  feeding supplement, ENSURE COMPLETE, (ENSURE COMPLETE) LIQD Take 237 mLs by mouth 3 (three) times daily between meals. 08/27/14  Yes Barrett, Rhonda G, PA-C  LIVALO 2 MG TABS TAKE 1 TABLET (2 MG TOTAL) BY MOUTH DAILY. 05/16/17  Yes Troy Sine, MD  losartan (COZAAR) 100 MG tablet TAKE 1 TABLET BY MOUTH DAILY 04/26/17  Yes Troy Sine, MD  metoprolol tartrate (LOPRESSOR) 50 MG tablet Take 1.5 tablets twice a day Patient taking differently: Take 75 mg by mouth 2 (two) times daily.  04/07/17  Yes Troy Sine, MD  Multiple Vitamins-Minerals (SENIOR MULTIVITAMIN PLUS PO) Take 1 tablet by mouth daily.   Yes [provider]  omega-3 acid ethyl esters (LOVAZA) 1 g capsule Take 1 capsule (1 g total) by mouth every evening. 07/07/17  Yes Troy Sine, MD  pantoprazole (PROTONIX) 40 MG tablet Take 1 tablet (40 mg total) by mouth 2 (two) times daily. Patient taking differently: Take 40 mg by mouth daily as needed (for acid reflux).  02/09/16  Yes Troy Sine, MD  timolol (TIMOPTIC) 0.5 % ophthalmic solution Place 1 drop into both eyes 2 (two) times daily.    Yes [provider]    Inpatient Medications: Scheduled Meds: . amLODipine  7.5 mg Oral QHS  . aspirin EC  81 mg Oral QPM  . citalopram  10 mg Oral Daily  . clopidogrel  75 mg Oral Daily  . enoxaparin (LOVENOX) injection  40 mg Subcutaneous Q24H  . ezetimibe  10 mg Oral Daily  . feeding supplement (ENSURE ENLIVE)  237 mL Oral TID BM  .  losartan  100 mg Oral Daily  . metoprolol tartrate  75 mg Oral BID  . multivitamin with minerals  1 tablet Oral Daily  . NON FORMULARY 2 mg  2 mg Oral Daily  . omega-3 acid ethyl esters  1 g Oral QPM  . pantoprazole  40 mg Oral BID  . sodium chloride flush  3 mL Intravenous Q12H  . timolol  1 drop Both Eyes BID   Continuous Infusions: . sodium chloride 75 mL/hr at 01/08/18 0228   PRN Meds: acetaminophen **OR** acetaminophen, hydrALAZINE, nitroGLYCERIN, ondansetron **OR** ondansetron (ZOFRAN) IV, oxyCODONE-acetaminophen, polyethylene glycol, zolpidem  Allergies:    Allergies  Allergen Reactions  .  Crestor [Rosuvastatin Calcium]     Muscle pain  . Lipitor [Atorvastatin Calcium]     Muscle pain    Social History:   Social History   Socioeconomic History  . Marital status: Widowed    Spouse name: Not on file  . Number of children: 4  . Years of education: Not on file  . Highest education level: Not on file  Social Needs  . Financial resource strain: Not on file  . Food insecurity - worry: Not on file  . Food insecurity - inability: Not on file  . Transportation needs - medical: Not on file  . Transportation needs - non-medical: Not on file  Occupational History  . Not on file  Tobacco Use  . Smoking status: Former Smoker    Last attempt to quit: 05/07/1984    Years since quitting: 33.6  . Smokeless tobacco: Never Used  Substance and Sexual Activity  . Alcohol use: No  . Drug use: No  . Sexual activity: Not Currently  Other Topics Concern  . Not on file  Social History Narrative  . Not on file    Family History:    Family History  Problem Relation Age of Onset  . Throat cancer Father   . Cancer Mother   . CAD Son   . Breast cancer Sister        cervical cancer  . Colon cancer Brother        throat cancer  . Pulmonary embolism Julie Bass   . Hypertension Julie Bass      ROS:  Please see the history of present illness.   All other ROS reviewed and  negative.     Physical Exam/Data:   Vitals:   01/08/18 0133 01/08/18 0140 01/08/18 0555 01/08/18 0900  BP: (!) 171/65  (!) 153/49 (!) 160/54  Pulse: 62   66  Resp: 14   16  Temp: 97.8 F (36.6 C)  97.6 F (36.4 C) 98.6 F (37 C)  TempSrc: Oral  Oral Oral  SpO2: 98%  98% 97%  Weight:  119 lb 12.8 oz (54.3 kg)      Intake/Output Summary (Last 24 hours) at 01/08/2018 1124 Last data filed at 01/08/2018 1036 Gross per 24 hour  Intake -  Output 600 ml  Net -600 ml   Filed Weights   01/08/18 0140  Weight: 119 lb 12.8 oz (54.3 kg)   Body mass index is 23.4 kg/m.  General:  Well nourished, well developed, elderly female in no acute distress HEENT: normal Lymph: no adenopathy Neck: no JVD Endocrine:  No thryomegaly Vascular: No carotid bruits; FA pulses 2+ bilaterally without bruits  Cardiac:  normal S1, S2; RRR; no murmur  Lungs:  clear to auscultation bilaterally, no wheezing, rhonchi or rales  Abd: soft, nontender, no hepatomegaly  Ext: no edema Musculoskeletal:  No deformities, BUE and BLE strength normal and equal Skin: warm and dry  Neuro:  CNs 2-12 intact, no focal abnormalities noted Psych:  Normal affect   EKG:  The EKG was personally reviewed and demonstrates:  Sinus bradycardia with 1st degree A-V block, 58 bpm, QTC 471 Lateral non-specific ST/T changes.  Telemetry:  Telemetry was personally reviewed and demonstrates:  Sinus bradycardia 50's-60s with 9 beats WCT this am.  Relevant CV Studies:  Echocardiogram 10/13/2016 Study Conclusions - Left ventricle: The cavity size was normal. Wall thickness was   increased in a pattern of mild LVH. There was mild focal basal   hypertrophy of the septum.  Systolic function was normal. The   estimated ejection fraction was in the range of 55% to 60%. Wall   motion was normal; there were no regional wall motion   abnormalities. Features are consistent with a pseudonormal left   ventricular filling pattern, with concomitant  abnormal relaxation   and increased filling pressure (grade 2 diastolic dysfunction).   Doppler parameters are consistent with high ventricular filling   pressure. - Aortic valve: Valve mobility was restricted. There was trivial   regurgitation. - Mitral valve: Calcified annulus. - Left atrium: The atrium was mildly dilated. - Pulmonary arteries: Systolic pressure was mildly increased. - Pericardium, extracardiac: A trivial pericardial effusion was   identified.  Impressions: - Normal LV systolic function; mild LVH; grade 2 diastolic   dysfunction with elevated LV filling pressure; mild LAE;   sclerotic aortic valve with trace AI; no AS by doppler; mild TR   with mildly elevated pulmonary pressure.   Laboratory Data:  Chemistry Recent Labs  Lab 01/07/18 2115 01/08/18 0639  NA 139 137  K 4.6 4.1  CL 103 102  CO2 25 22  GLUCOSE 118* 113*  BUN 14 15  CREATININE 1.20* 1.13*  CALCIUM 10.2 9.4  GFRNONAA 39* 42*  GFRAA 45* 48*  ANIONGAP 11 13    No results for input(s): PROT, ALBUMIN, AST, ALT, ALKPHOS, BILITOT in the last 168 hours. Hematology Recent Labs  Lab 01/07/18 2115 01/08/18 0639  WBC 10.7* 9.1  RBC 5.15* 4.78  HGB 16.4* 14.7  HCT 49.6* 45.6  MCV 96.3 95.4  MCH 31.8 30.8  MCHC 33.1 32.2  RDW 12.9 12.8  PLT 147* 160   Cardiac Enzymes Recent Labs  Lab 01/08/18 0045 01/08/18 0639  TROPONINI <0.03 <0.03    Recent Labs  Lab 01/07/18 2131  TROPIPOC 0.01    BNPNo results for input(s): BNP, PROBNP in the last 168 hours.  DDimer No results for input(s): DDIMER in the last 168 hours.  Radiology/Studies:  Dg Chest 2 View  Result Date: 01/07/2018 CLINICAL DATA:  Chest pain EXAM: CHEST  2 VIEW COMPARISON:  06/13/2016 FINDINGS: Partially visualized right shoulder replacement. No pleural effusion. Borderline to mild cardiomegaly. No focal consolidation or effusion. Aortic atherosclerosis. No pneumothorax. Thoracic compression deformities of the mid to  lower spine. IMPRESSION: No active cardiopulmonary disease.  Borderline to mild cardiomegaly. Electronically Signed   By: Donavan Foil M.D.   On: 01/07/2018 21:10   Dg Shoulder Right  Result Date: 01/08/2018 CLINICAL DATA:  Right shoulder pain EXAM: RIGHT SHOULDER - 2+ VIEW COMPARISON:  08/23/2014 FINDINGS: Status post reverse right shoulder replacement with intact hardware. Normal alignment. No obvious fracture. IMPRESSION: Status post right shoulder replacement. No definite acute osseous abnormality Electronically Signed   By: Donavan Foil M.D.   On: 01/08/2018 00:52   Ct Head Wo Contrast  Result Date: 01/08/2018 CLINICAL DATA:  84 year old female with syncope. EXAM: CT HEAD WITHOUT CONTRAST TECHNIQUE: Contiguous axial images were obtained from the base of the skull through the vertex without intravenous contrast. COMPARISON:  Head CT dated 02/10/2015 FINDINGS: Brain: There is moderate to advanced age-related atrophy and chronic microvascular ischemic changes. There is no acute intracranial hemorrhage. No mass effect or midline shift. Dilated extra-axial/subdural spaces with low attenuating fluid likely represent old subdural hematoma or hygroma and measure approximately 15 mm over the right frontal lobe. Vascular: No hyperdense vessel or unexpected calcification. Skull: Normal. Negative for fracture or focal lesion. Sinuses/Orbits: Mild mucoperiosteal thickening of paranasal sinuses. No air-fluid  level. Mastoid air cells are clear. Other: None IMPRESSION: 1. No acute intracranial hemorrhage. 2. Moderate severe age-related atrophy and chronic microvascular ischemic changes. 3. Dilated extra-axial spaces likely representing old subdural hematoma or hygroma. Electronically Signed   By: Anner Crete M.D.   On: 01/08/2018 00:48    Assessment and Plan:   Syncope -Pt with history of syncope and NSVT. Has loop recorder. Last interrogation on 11/21/17 showed normal function and no arrhythmias.  -Pt had  a fall while washing dishes and turned around to go to table. Julie Bass denies dizziness, loss of consciousness, chest pain, dyspnea.  -Not orthostatic by VS done here.  -EKG shows sinus bradycardia with very mild changes in lateral leads -Will check loop recorder for arrhythmia as cause for fall. [employer] rep contacted and will come early this afternoon.   NSVT -Known NSVT since 2015 with loop recorder in place. Pt on metoprolol 75 mg bid. -Currently in sinus bradycardia, 9 beats WCT this am.  -Loop recorder to be interrogated today.   Hypertension -Home meds include norvasc 7.5 mg, cozaar 100 mg daily, metoprolol 75 mg bid. -Orthostatic VS unremarkable -BP is elevated.   PVD: on aspirin and plavix   For questions or updates, please contact [place] Please consult [email or web address] for contact info under Cardiology/STEMI.   Signed, Daune Perch, NP  01/08/2018 11:24 AM   The patient was seen and examined, and I agree with the history, physical exam, assessment and plan as documented above, with modifications as noted below. I have also personally reviewed all relevant documentation, old records, labs, and both radiographic and cardiovascular studies. I have also independently interpreted old and new ECG's.  Briefly, this is an 84 yr old Julie Bass with a h/o nonsustained VT (on beta blockers for this), HTN, HLD, Alzheimer's vs vascular dementia, and PVD (h/o SFA stenting) admitted with purported syncope. Julie Bass has a prior episode of syncope in 08/2014 for which a loop recorder was implanted. At that time, Julie Bass had a 15-beat run of nonsustained VT. Due to bradycardia and fatigue, beta blocker dose was reduced by Dr. Claiborne Billings on 04/07/17. 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  Normal device interrogation 11/21/17.  Julie Bass declined an appt with Dr. Sallyanne Kuster as per EMR review.  Julie Bass tells me Julie Bass was washing the dishes and then suddenly lost Julie Bass balance and fell backwards between the kitchen  sink and the kitchen table. Julie Bass does not recall palpitations or sudden chest pain or shortness of breath prior to this happening.  Julie Bass primary complaint relates to right shoulder and right-sided chest pain every since a surgery several years ago. Julie Bass also mentions right leg pain with exertion.  9-beat run of NSVT on telemetry here.  CXR shows no active cardiopulmonary disease.  Head CT showed no acute intracranial process with moderate severe age-related atrophy and chronic microvascular ischemic changes, and dilated extra-axial spaces likely representing old subdural hematoma or hygroma.  CBC, troponins, UA, and BMET unrevealing.  Echo ordered by internal medicine and is pending.   I reviewed the ECG which showed sinus bradycardia with 1st degree AV block (PR 252 ms) and nonspecific ST-T abnormalities, particularly in lateral leads.  Uncertain this was an actual syncopal episode as the patient denies loss of consciousness. It was an unwitnessed event.  We have contacted the [employer] representative and they will come early this afternoon to have the loop recorder interrogated to see if this is arrhythmic in etiology.   Kate Sable, MD, [facility]  01/08/2018 12:20  PM

## 2018-01-08 NOTE — ED Notes (Signed)
Delay in lab draw,  Pt not in room 

## 2018-01-08 NOTE — ED Notes (Signed)
Attempted report x 1; name and call back number provided 

## 2018-01-08 NOTE — Care Management Obs Status (Signed)
[facility] NOTIFICATION   Patient Details  Name: DYAMOND TOLOSA MRN: [id number] Date of Birth: 06/25/1926   Medicare Observation Status Notification Given:  Yes    Carles Collet, RN 01/08/2018, 9:17 AM

## 2018-01-08 NOTE — Evaluation (Signed)
Occupational Therapy Evaluation Patient Details Name: Julie Bass MRN: [id number] DOB: November 03, 1926 Today's Date: 01/08/2018    History of Present Illness Julie Bass is a 84 y.o. female with medical history significant of hypertension, hyperlipidemia, GERD, depression, CK-3, PVD, NSVT, Loop recorder placement, dementia and syncope, who presents with syncope and fall.Marland Kitchen PNT REPORTS THAT SHE HAD SX ON R SHLD A LONG TIME AGO BUT HAD FULL USE.   Clinical Impression   PNT IS MOTIVATED TO WORK WITH THERAPY TO REGAIN LOST FUNCTION. PATIENT DTR WORKS BUT ASSISTS IN EVENINGS. PATIENT GRANDSON WORKS OUT OF TOWN. PATIENT STATES SHE WILL GO TO REHAB IF NEEDED. PATIENT IS REQUIRING ADDITIONAL ASSIST WITH ADLS AND MOBILITY AND WILL BENEFIT FROM ST SNF STAY TO RETURN TO BASELINE.     Follow Up Recommendations  SNF    Equipment Recommendations  None recommended by OT    Recommendations for Other Services       Precautions / Restrictions Precautions Precautions: Fall Restrictions Weight Bearing Restrictions: (P) No      Mobility Bed Mobility Overal bed mobility: Needs Assistance Bed Mobility: Sit to Supine       Sit to supine: Min assist      Transfers Overall transfer level: Needs assistance   Transfers: Sit to/from Stand;Stand Pivot Transfers Sit to Stand: Min assist Stand pivot transfers: Min assist            Balance                                           ADL either performed or assessed with clinical judgement   ADL Overall ADL's : Needs assistance/impaired Eating/Feeding: Independent   Grooming: Wash/dry hands;Wash/dry face;Supervision/safety;Sitting   Upper Body Bathing: Minimal assistance;Sitting   Lower Body Bathing: Moderate assistance;Sit to/from stand   Upper Body Dressing : Minimal assistance;Sitting   Lower Body Dressing: Moderate assistance;Sit to/from stand   Toilet Transfer: Minimal assistance;Stand-pivot;BSC    Toileting- Clothing Manipulation and Hygiene: Minimal assistance;Sit to/from stand       Functional mobility during ADLs: Minimal assistance General ADL Comments: PATIENT IS REQUIRNG ADDITIOANL ASSIST FROM BASELINE.     Vision Baseline Vision/History: Wears glasses;Glaucoma Wears Glasses: At all times Patient Visual Report: No change from baseline       Perception     Praxis      Pertinent Vitals/Pain Pain Assessment: 0-10 Pain Score: 8  Pain Location: R SHLD, R CHEST  WITH ACTIVITY Pain Descriptors / Indicators: Aching     Hand Dominance Right   Extremity/Trunk Assessment Upper Extremity Assessment Upper Extremity Assessment: RUE deficits/detail RUE Deficits / Details: R SHLD 90 DEGREEES WITH PAIN. RUE: (PNT REPORTS THIS IS NEW SHLD PAIN FROM FALL.)           Communication Communication Communication: No difficulties   Cognition Arousal/Alertness: Awake/alert Behavior During Therapy: WFL for tasks assessed/performed Overall Cognitive Status: History of cognitive impairments - at baseline                                     General Comments       Exercises     Shoulder Instructions      Home Living   Living Arrangements: Other relatives  Additional Comments: PATINET GRANDSON LIVES WITH HER BUT HE WORKS OUT OF TOWN. HER DTR ASSISTS AS NEEDED.      Prior Functioning/Environment Level of Independence: Needs assistance    ADL's / Homemaking Assistance Needed: PNT DTR ASSISTED WITH SHOWER. PNT WAS MOD I WITH  ADLS. PNT WOULD MAKE HER MEALS AND WOULD PERFORM LIGHT CLEANING.            OT Problem List: Decreased strength;Decreased range of motion;Decreased activity tolerance;Decreased knowledge of use of DME or AE;Impaired UE functional use;Pain      OT Treatment/Interventions: Self-care/ADL training;DME and/or AE instruction;Therapeutic activities;Patient/family education    OT  Goals(Current goals can be found in the care plan section) Acute Rehab OT Goals Patient Stated Goal: HAVE LESS PAIN OT Goal Formulation: With patient Time For Goal Achievement: 01/22/18 Potential to Achieve Goals: Good ADL Goals Pt Will Perform Grooming: Independently;standing Pt Will Perform Upper Body Bathing: with supervision;sitting Pt Will Perform Lower Body Bathing: with supervision;sit to/from stand Pt Will Perform Upper Body Dressing: with set-up;sitting Pt Will Perform Lower Body Dressing: with set-up;with supervision;sit to/from stand Pt Will Transfer to Toilet: with set-up;with supervision;ambulating Pt Will Perform Toileting - Clothing Manipulation and hygiene: with modified independence;sit to/from stand Pt Will Perform Tub/Shower Transfer: Shower transfer;rolling walker;ambulating  OT Frequency: Min 2X/week   Barriers to D/C: Decreased caregiver support          Co-evaluation              AM-PAC PT "6 Clicks" Daily Activity     Outcome Measure Help from another person eating meals?: None Help from another person taking care of personal grooming?: A Little Help from another person toileting, which includes using toliet, bedpan, or urinal?: A Little Help from another person bathing (including washing, rinsing, drying)?: A Lot Help from another person to put on and taking off regular upper body clothing?: A Little Help from another person to put on and taking off regular lower body clothing?: A Lot 6 Click Score: 17   End of Session Nurse Communication: Patient requests pain meds  Activity Tolerance: Patient limited by pain Patient left: in bed;with call bell/phone within reach  OT Visit Diagnosis: Unsteadiness on feet (R26.81);Muscle weakness (generalized) (M62.81);History of falling (Z91.81)                Time: 6378-5885 OT Time Calculation (min): 44 min Charges:  OT General Charges $OT Visit: 1 Visit OT Evaluation $OT Eval Moderate Complexity: 1  Mod OT Treatments $Self Care/Home Management : 23-37 mins G-Codes:     6 CLICKS  Sommer Spickard 01/08/2018, 11:19 AM

## 2018-01-09 ENCOUNTER — Encounter (HOSPITAL_COMMUNITY)
Admission: EM | Disposition: A | Discharge status: Home Health | Payer: Self-pay | Source: Home / Self Care | Attending: Internal Medicine

## 2018-01-09 ENCOUNTER — Encounter (HOSPITAL_COMMUNITY): Payer: Medicare Other

## 2018-01-09 ENCOUNTER — Other Ambulatory Visit (HOSPITAL_COMMUNITY): Payer: Medicare Other

## 2018-01-09 DIAGNOSIS — K219 Gastro-esophageal reflux disease without esophagitis: Secondary | ICD-10-CM

## 2018-01-09 DIAGNOSIS — R079 Chest pain, unspecified: Secondary | ICD-10-CM | POA: Diagnosis not present

## 2018-01-09 DIAGNOSIS — I442 Atrioventricular block, complete: Secondary | ICD-10-CM | POA: Diagnosis not present

## 2018-01-09 DIAGNOSIS — F0390 Unspecified dementia without behavioral disturbance: Secondary | ICD-10-CM | POA: Diagnosis present

## 2018-01-09 DIAGNOSIS — Z4509 Encounter for adjustment and management of other cardiac device: Secondary | ICD-10-CM | POA: Diagnosis not present

## 2018-01-09 DIAGNOSIS — Z95818 Presence of other cardiac implants and grafts: Secondary | ICD-10-CM | POA: Diagnosis not present

## 2018-01-09 DIAGNOSIS — Z8249 Family history of ischemic heart disease and other diseases of the circulatory system: Secondary | ICD-10-CM | POA: Diagnosis not present

## 2018-01-09 DIAGNOSIS — R0789 Other chest pain: Secondary | ICD-10-CM | POA: Diagnosis not present

## 2018-01-09 DIAGNOSIS — K449 Diaphragmatic hernia without obstruction or gangrene: Secondary | ICD-10-CM | POA: Diagnosis present

## 2018-01-09 DIAGNOSIS — I459 Conduction disorder, unspecified: Secondary | ICD-10-CM | POA: Diagnosis not present

## 2018-01-09 DIAGNOSIS — Z7902 Long term (current) use of antithrombotics/antiplatelets: Secondary | ICD-10-CM | POA: Diagnosis not present

## 2018-01-09 DIAGNOSIS — W010XXA Fall on same level from slipping, tripping and stumbling without subsequent striking against object, initial encounter: Secondary | ICD-10-CM | POA: Diagnosis present

## 2018-01-09 DIAGNOSIS — I6529 Occlusion and stenosis of unspecified carotid artery: Secondary | ICD-10-CM | POA: Diagnosis not present

## 2018-01-09 DIAGNOSIS — Z87891 Personal history of nicotine dependence: Secondary | ICD-10-CM | POA: Diagnosis not present

## 2018-01-09 DIAGNOSIS — I129 Hypertensive chronic kidney disease with stage 1 through stage 4 chronic kidney disease, or unspecified chronic kidney disease: Secondary | ICD-10-CM | POA: Diagnosis present

## 2018-01-09 DIAGNOSIS — R918 Other nonspecific abnormal finding of lung field: Secondary | ICD-10-CM | POA: Diagnosis not present

## 2018-01-09 DIAGNOSIS — F329 Major depressive disorder, single episode, unspecified: Secondary | ICD-10-CM | POA: Diagnosis present

## 2018-01-09 DIAGNOSIS — I440 Atrioventricular block, first degree: Secondary | ICD-10-CM | POA: Diagnosis present

## 2018-01-09 DIAGNOSIS — Z7982 Long term (current) use of aspirin: Secondary | ICD-10-CM | POA: Diagnosis not present

## 2018-01-09 DIAGNOSIS — M6281 Muscle weakness (generalized): Secondary | ICD-10-CM | POA: Diagnosis present

## 2018-01-09 DIAGNOSIS — H409 Unspecified glaucoma: Secondary | ICD-10-CM | POA: Diagnosis present

## 2018-01-09 DIAGNOSIS — Z96611 Presence of right artificial shoulder joint: Secondary | ICD-10-CM | POA: Diagnosis present

## 2018-01-09 DIAGNOSIS — Z95828 Presence of other vascular implants and grafts: Secondary | ICD-10-CM | POA: Diagnosis not present

## 2018-01-09 DIAGNOSIS — E785 Hyperlipidemia, unspecified: Secondary | ICD-10-CM | POA: Diagnosis present

## 2018-01-09 DIAGNOSIS — I5030 Unspecified diastolic (congestive) heart failure: Secondary | ICD-10-CM | POA: Diagnosis not present

## 2018-01-09 DIAGNOSIS — R55 Syncope and collapse: Secondary | ICD-10-CM | POA: Diagnosis not present

## 2018-01-09 DIAGNOSIS — I472 Ventricular tachycardia: Secondary | ICD-10-CM | POA: Diagnosis not present

## 2018-01-09 DIAGNOSIS — I455 Other specified heart block: Secondary | ICD-10-CM | POA: Diagnosis present

## 2018-01-09 DIAGNOSIS — N183 Chronic kidney disease, stage 3 (moderate): Secondary | ICD-10-CM | POA: Diagnosis not present

## 2018-01-09 DIAGNOSIS — I739 Peripheral vascular disease, unspecified: Secondary | ICD-10-CM | POA: Diagnosis present

## 2018-01-09 HISTORY — PX: LOOP RECORDER REMOVAL: EP1215

## 2018-01-09 HISTORY — PX: PACEMAKER IMPLANT: EP1218

## 2018-01-09 LAB — SURGICAL PCR SCREEN
MRSA, PCR: NEGATIVE
Staphylococcus aureus: NEGATIVE

## 2018-01-09 LAB — HEMOGLOBIN A1C
Hgb A1c MFr Bld: 5.1 % (ref 4.8–5.6)
Mean Plasma Glucose: 100 mg/dL

## 2018-01-09 LAB — GLUCOSE, CAPILLARY: Glucose-Capillary: 103 mg/dL — ABNORMAL HIGH (ref 65–99)

## 2018-01-09 SURGERY — LOOP RECORDER REMOVAL

## 2018-01-09 MED ORDER — GENTAMICIN SULFATE 40 MG/ML IJ SOLN
INTRAMUSCULAR | Status: AC
  Filled 2018-01-09: qty 2

## 2018-01-09 MED ORDER — CEFAZOLIN SODIUM-DEXTROSE 1-4 GM/50ML-% IV SOLN
1.0000 g | Freq: Three times a day (TID) | INTRAVENOUS | Status: AC
  Administered 2018-01-09 (×2): 1 g via INTRAVENOUS
  Filled 2018-01-09 (×2): qty 50

## 2018-01-09 MED ORDER — HEPARIN (PORCINE) IN NACL 2-0.9 UNIT/ML-% IJ SOLN
INTRAMUSCULAR | Status: AC
  Filled 2018-01-09: qty 500

## 2018-01-09 MED ORDER — SODIUM CHLORIDE 0.9 % IV SOLN: INTRAVENOUS | Status: DC

## 2018-01-09 MED ORDER — CHLORHEXIDINE GLUCONATE 4 % EX LIQD
60.0000 mL | Freq: Once | CUTANEOUS | Status: AC
  Administered 2018-01-09: 4 via TOPICAL
  Filled 2018-01-09: qty 15

## 2018-01-09 MED ORDER — MIDAZOLAM HCL 5 MG/5ML IJ SOLN
INTRAMUSCULAR | Status: AC
  Filled 2018-01-09: qty 5

## 2018-01-09 MED ORDER — IOPAMIDOL (ISOVUE-370) INJECTION 76%
INTRAVENOUS | Status: AC
  Filled 2018-01-09: qty 50

## 2018-01-09 MED ORDER — ONDANSETRON HCL 4 MG/2ML IJ SOLN: 4.0000 mg | Freq: Four times a day (QID) | INTRAMUSCULAR | Status: DC | PRN

## 2018-01-09 MED ORDER — HEPARIN (PORCINE) IN NACL 2-0.9 UNIT/ML-% IJ SOLN
INTRAMUSCULAR | Status: AC | PRN
  Administered 2018-01-09: 500 mL

## 2018-01-09 MED ORDER — LIDOCAINE HCL (PF) 1 % IJ SOLN
INTRAMUSCULAR | Status: AC
  Filled 2018-01-09: qty 60

## 2018-01-09 MED ORDER — IOPAMIDOL (ISOVUE-370) INJECTION 76%
INTRAVENOUS | Status: DC | PRN
  Administered 2018-01-09: 10 mL

## 2018-01-09 MED ORDER — CEFAZOLIN SODIUM-DEXTROSE 2-4 GM/100ML-% IV SOLN
INTRAVENOUS | Status: AC
  Filled 2018-01-09: qty 100

## 2018-01-09 MED ORDER — LIDOCAINE HCL (PF) 1 % IJ SOLN
INTRAMUSCULAR | Status: DC | PRN
  Administered 2018-01-09: 60 mL

## 2018-01-09 MED ORDER — CEFAZOLIN SODIUM-DEXTROSE 2-4 GM/100ML-% IV SOLN
2.0000 g | INTRAVENOUS | Status: AC
  Administered 2018-01-09: 2 g via INTRAVENOUS
  Filled 2018-01-09: qty 100

## 2018-01-09 MED ORDER — SODIUM CHLORIDE 0.9 % IR SOLN
80.0000 mg | Status: AC
  Administered 2018-01-09: 80 mg
  Filled 2018-01-09: qty 2

## 2018-01-09 MED ORDER — ACETAMINOPHEN 325 MG PO TABS
325.0000 mg | ORAL_TABLET | ORAL | Status: DC | PRN
  Administered 2018-01-10: 650 mg via ORAL
  Filled 2018-01-09: qty 2

## 2018-01-09 SURGICAL SUPPLY — 7 items
CABLE SURGICAL S-101-97-12 (CABLE) ×2 IMPLANT
LEAD SOLIA S PRO MRI 45 (Lead) ×2 IMPLANT
LEAD SOLIA S PRO MRI 53 (Lead) ×2 IMPLANT
PACEMAKER EDORA 8DR-T MRI (Pacemaker) ×2 IMPLANT
PAD DEFIB LIFELINK (PAD) ×2 IMPLANT
SHEATH CLASSIC 7F (SHEATH) ×4 IMPLANT
TRAY PACEMAKER INSERTION (PACKS) ×2 IMPLANT

## 2018-01-09 NOTE — Progress Notes (Signed)
Home med, pitavastatin discontinued. Today's dose was returned to pharmacy in person.

## 2018-01-09 NOTE — Progress Notes (Signed)
PROGRESS NOTE    Julie Bass  DTO:[id number] DOB: Sep 04, 1926 DOA: 01/07/2018 PCP: Denita Lung, MD   Brief Narrative:  HPI on 01/07/2018 by Dr. Ivor Costa ARNELLA PRALLE is a 84 y.o. female with medical history significant of hypertension, hyperlipidemia, GERD, depression, CK-3, PVD, NSVT, Loop recorder placement, dementia and syncope, who presents with syncope and fall.  Per her daughter, The patient was standing at the sink when she suddenly lost consciousness and fell at about 7:00 PM. The syncopal event was not witnessed by family. After these events, patient started having chest pain. It is located in the frontal chest, constant, sharp, moderate, nonradiating. She also has chest wall tenderness on palpation. Patient denies shortness rest, fever or chills. She has some mild dry cough. She also has right shoulder pain. Patient denies nausea, vomiting, diarrhea, abdominal pain, symptoms of UTI or unilateral weakness.She does have a [employer] loop recorder placed previously for NSVT and syncope.  Assessment & Plan   Syncope and fall -Etiology likely secondary to sinus pauses/arrest -CT head unremarkable for acute intracranial abnormalities -Patient does have a history of NSVT, vasovagal syncope and carotid artery stenosis -Cardiology consulted and appreciated-plan for loop recorder removal and implant of pacemaker today -Echocardiogram pending -PT Consulted, and pending -OT recommended SNF -Patient does have a loop recorder in place, interrogation showed 30 second pause at time of syncope with pacing.  Patient also noted to have 19 beats of VT back in January 2018  Chest pain -Likely musculoskeletal secondary to patient's fall -Reproducible on palpation -Troponin cycled and unremarkable x3 -As above echocardiogram pending -Continue Voltaren gel  NSVT -Patient has had NSVT since 2015, also has a loop recorder which has been interrogated, results listed above -Metoprolol  currently held  Hyperlipidemia -Continue statin, Zetia -Lipid panel showed TC 156, HDL 70, LDL 71, triglycerides 75  Essential hypertension -continue amlodipine, losartan -Metoprolol held due to pause found on interrogation of loop recorder  Chronic kidney disease, stage III -Creatinine currently stable and near baseline, 1.13 -Continue to monitor BMP  GERD  -Continue PPI  PVD -Continue aspirin, Plavix  DVT Prophylaxis Lovenox  Code Status: Full  Family Communication: None at bedside  Disposition Plan: Observation, pending pacemaker implantation  Consultants Cardiology/EP  Procedures  None  Antibiotics   Anti-infectives (From admission, onward)   Start     Dose/Rate Route Frequency Ordered Stop   01/09/18 0745  gentamicin (GARAMYCIN) 80 mg in sodium chloride irrigation 0.9 % 500 mL irrigation     80 mg Irrigation On call 01/09/18 0730 01/10/18 0745   01/09/18 0745  ceFAZolin (ANCEF) IVPB 2g/100 mL premix     2 g 200 mL/hr over 30 Minutes Intravenous On call 01/09/18 0730 01/09/18 1045      Subjective:   [name] seen and examined today.  Denies current chest pain, shortness of breath, abdominal pain, nausea or vomiting, diarrhea constipation.  Objective:   Vitals:   01/08/18 2155 01/09/18 0209 01/09/18 0550 01/09/18 1014  BP: (!) 145/64  128/72   Pulse:   66   Resp:   18   Temp:   98 F (36.7 C)   TempSrc:   Oral   SpO2:   98% 100%  Weight:   54.2 kg (119 lb 7.8 oz)   Height:  5\' 4"  (1.626 m)      Intake/Output Summary (Last 24 hours) at 01/09/2018 1107 Last data filed at 01/09/2018 0728 Gross per 24 hour  Intake 600 ml  Output 900 ml  Net -300 ml   Filed Weights   01/08/18 0140 01/09/18 0550  Weight: 54.3 kg (119 lb 12.8 oz) 54.2 kg (119 lb 7.8 oz)   Exam  General: Well developed, well nourished, NAD, appears stated age  31: NCAT, mucous membranes moist.   Neck: Supple, mass on left side  Cardiovascular: S1 S2 auscultated,  RRR, no murmur  Respiratory: Clear to auscultation bilaterally with equal chest rise  Abdomen: Soft, nontender, nondistended, + bowel sounds  Extremities: warm dry without cyanosis clubbing or edema  Neuro: AAOx3, nonfocal  Psych: Normal affect and demeanor with intact judgement and insight  Data Reviewed: I have personally reviewed following labs and imaging studies  CBC: Recent Labs  Lab 01/07/18 2115 01/08/18 0639  WBC 10.7* 9.1  HGB 16.4* 14.7  HCT 49.6* 45.6  MCV 96.3 95.4  PLT 147* 790   Basic Metabolic Panel: Recent Labs  Lab 01/07/18 2115 01/08/18 0639  NA 139 137  K 4.6 4.1  CL 103 102  CO2 25 22  GLUCOSE 118* 113*  BUN 14 15  CREATININE 1.20* 1.13*  CALCIUM 10.2 9.4   GFR: Estimated Creatinine Clearance: 28.9 mL/min (A) (by C-G formula based on SCr of 1.13 mg/dL (H)). Liver Function Tests: No results for input(s): AST, ALT, ALKPHOS, BILITOT, PROT, ALBUMIN in the last 168 hours. No results for input(s): LIPASE, AMYLASE in the last 168 hours. No results for input(s): AMMONIA in the last 168 hours. Coagulation Profile: No results for input(s): INR, PROTIME in the last 168 hours. Cardiac Enzymes: Recent Labs  Lab 01/08/18 0045 01/08/18 0639 01/08/18 1211  TROPONINI <0.03 <0.03 <0.03   BNP (last 3 results) No results for input(s): PROBNP in the last 8760 hours. HbA1C: Recent Labs    01/08/18 0639  HGBA1C 5.1   CBG: Recent Labs  Lab 01/07/18 2120 01/08/18 0746 01/09/18 0601  GLUCAP 115* 107* 103*   Lipid Profile: Recent Labs    01/08/18 0639  CHOL 156  HDL 70  LDLCALC 71  TRIG 75  CHOLHDL 2.2   Thyroid Function Tests: No results for input(s): TSH, T4TOTAL, FREET4, T3FREE, THYROIDAB in the last 72 hours. Anemia Panel: No results for input(s): VITAMINB12, FOLATE, FERRITIN, TIBC, IRON, RETICCTPCT in the last 72 hours. Urine analysis:    Component Value Date/Time   COLORURINE STRAW (A) 01/07/2018 2144   APPEARANCEUR CLEAR  01/07/2018 2144   LABSPEC 1.006 01/07/2018 2144   PHURINE 7.0 01/07/2018 2144   GLUCOSEU NEGATIVE 01/07/2018 2144   HGBUR MODERATE (A) 01/07/2018 2144   BILIRUBINUR NEGATIVE 01/07/2018 2144   BILIRUBINUR n 11/25/2016 1409   KETONESUR 5 (A) 01/07/2018 2144   PROTEINUR NEGATIVE 01/07/2018 2144   UROBILINOGEN negative 11/25/2016 1409   UROBILINOGEN 1 10/08/2014 1529   NITRITE NEGATIVE 01/07/2018 2144   LEUKOCYTESUR NEGATIVE 01/07/2018 2144   Sepsis Labs: @LABRCNTIP (procalcitonin:4,lacticidven:4)  )No results found for this or any previous visit (from the past 240 hour(s)).    Radiology Studies: Dg Chest 2 View  Result Date: 01/07/2018 CLINICAL DATA:  Chest pain EXAM: CHEST  2 VIEW COMPARISON:  06/13/2016 FINDINGS: Partially visualized right shoulder replacement. No pleural effusion. Borderline to mild cardiomegaly. No focal consolidation or effusion. Aortic atherosclerosis. No pneumothorax. Thoracic compression deformities of the mid to lower spine. IMPRESSION: No active cardiopulmonary disease.  Borderline to mild cardiomegaly. Electronically Signed   By: Donavan Foil M.D.   On: 01/07/2018 21:10   Dg Shoulder Right  Result Date: 01/08/2018 CLINICAL DATA:  Right shoulder pain EXAM: RIGHT SHOULDER - 2+ VIEW COMPARISON:  08/23/2014 FINDINGS: Status post reverse right shoulder replacement with intact hardware. Normal alignment. No obvious fracture. IMPRESSION: Status post right shoulder replacement. No definite acute osseous abnormality Electronically Signed   By: Donavan Foil M.D.   On: 01/08/2018 00:52   Ct Head Wo Contrast  Result Date: 01/08/2018 CLINICAL DATA:  84 year old female with syncope. EXAM: CT HEAD WITHOUT CONTRAST TECHNIQUE: Contiguous axial images were obtained from the base of the skull through the vertex without intravenous contrast. COMPARISON:  Head CT dated 02/10/2015 FINDINGS: Brain: There is moderate to advanced age-related atrophy and chronic microvascular ischemic  changes. There is no acute intracranial hemorrhage. No mass effect or midline shift. Dilated extra-axial/subdural spaces with low attenuating fluid likely represent old subdural hematoma or hygroma and measure approximately 15 mm over the right frontal lobe. Vascular: No hyperdense vessel or unexpected calcification. Skull: Normal. Negative for fracture or focal lesion. Sinuses/Orbits: Mild mucoperiosteal thickening of paranasal sinuses. No air-fluid level. Mastoid air cells are clear. Other: None IMPRESSION: 1. No acute intracranial hemorrhage. 2. Moderate severe age-related atrophy and chronic microvascular ischemic changes. 3. Dilated extra-axial spaces likely representing old subdural hematoma or hygroma. Electronically Signed   By: Anner Crete M.D.   On: 01/08/2018 00:48     Scheduled Meds: . [MAR Hold] amLODipine  7.5 mg Oral QHS  . [MAR Hold] aspirin EC  81 mg Oral QPM  . [MAR Hold] citalopram  10 mg Oral Daily  . [MAR Hold] clopidogrel  75 mg Oral Daily  . [MAR Hold] diclofenac sodium  2 g Topical QID  . [MAR Hold] enoxaparin (LOVENOX) injection  40 mg Subcutaneous Q24H  . [MAR Hold] ezetimibe  10 mg Oral Daily  . [MAR Hold] feeding supplement (ENSURE ENLIVE)  237 mL Oral TID BM  . gentamicin irrigation  80 mg Irrigation On Call  . [MAR Hold] losartan  100 mg Oral Daily  . [MAR Hold] multivitamin with minerals  1 tablet Oral Daily  . [MAR Hold] omega-3 acid ethyl esters  1 g Oral QPM  . [MAR Hold] pantoprazole  40 mg Oral BID  . [MAR Hold] Pitavastatin Calcium  2 mg Oral Daily  . [MAR Hold] sodium chloride flush  3 mL Intravenous Q12H  . [MAR Hold] timolol  1 drop Both Eyes BID   Continuous Infusions: . sodium chloride 75 mL/hr at 01/08/18 1326  . sodium chloride    . heparin       LOS: 0 days   Time Spent in minutes   30 minutes  Samika Vetsch D.O. on 01/09/2018 at 11:07 AM  Between 7am to 7pm - Pager - [phone]  After 7pm go to [email or web address] - password  [email or web address]  And look for the night coverage person covering for me after hours  [facility] Office  [phone]

## 2018-01-09 NOTE — H&P (View-Only) (Signed)
ELECTROPHYSIOLOGY CONSULT NOTE    Patient ID: Julie Bass MRN: [id number], DOB/AGE: 82-Apr-1927 82 y.o.   Admit date: 01/07/2018 Date of Consult: 01/09/2018  Primary Physician: Denita Lung, MD Primary Cardiologist: Kelly/Croitoru Electrophysiologist: Allred  Patient Profile: Julie Bass is a 84 y.o. female with a history of syncope, hypertension, PVD, NSVT who is being seen today for the evaluation of syncope at the request of Dr Bronson Ing.  HPI:  Julie Bass is a 84 y.o. female with a longstanding history of syncope and pre-syncope. She has worn numerous monitors in the past without cause identified. She underwent ILR implant in 2015. On the day of admission, she was standing at her sink and has just finished washing dishes. She turned to go sit down and awoke on the floor. She does not remember losing consciousness. ILR interrogation at that time shows a period of sinus arrest/asystole for 30 seconds.  EP has been asked to evaluate for treatment options.  She denies chest pain, palpitations, dyspnea, PND, orthopnea, nausea, vomiting, dizziness, syncope, edema, weight gain, or early satiety.  Past Medical History:  Diagnosis Date  . Arthritis   . Chronic kidney disease    KIDNEY STONES  . Dementia    BEGINNING STAGES   . Depression   . GERD (gastroesophageal reflux disease)   . Glaucoma   . Hiatal hernia   . HTN (hypertension) 10/18/2011  . Hyperlipemia 10/18/2011  . NSVT (nonsustained ventricular tachycardia) ([place]) 10/18/2011  . Osteoporosis   . PAD (peripheral artery disease) ([place]) 10/18/2011   h/o left SFA stent     Surgical History:  Past Surgical History:  Procedure Laterality Date  . ABDOMINAL HYSTERECTOMY  1973  . CARDIAC CATHETERIZATION     2012  . CHOLECYSTECTOMY    . LOOP RECORDER IMPLANT N/A 08/27/2014   Procedure: LOOP RECORDER IMPLANT;  Surgeon: Coralyn Mark, MD;  Location: [facility];  Service: Cardiovascular;  Laterality: N/A;  .  LOWER EXTREMITY ANGIOGRAM  11/02/2007   stent of mid left SFA with 6x124mm EV3 self-expanding stent and 6x3 in prox region (Dr. Adora Fridge)  . LOWER EXTREMITY ANGIOGRAM    . LOWER EXTREMITY ARTERIAL DOPPLER  2013   right SFA with 50-69% diameter reduction, right PTA/peroneal occluded, L SFA prox to stent has narrowing with increased velocities >60% diameter reduction, L SFA stent patent, L ATA w/occlusive disease, bilat ABIs show mild arterial insuffiency at rest  . NM MYOCAR PERF WALL MOTION  10/2011   non-gated - normal stuy, EF 82%, normal LV wall motion  . REVERSE SHOULDER ARTHROPLASTY Right 01/26/2013   Procedure: RIGHT REVERSE TOTAL SHOULDER ARTHROPLASTY;  Surgeon: Augustin Schooling, MD;  Location: [facility];  Service: Orthopedics;  Laterality: Right;  . TRANSTHORACIC ECHOCARDIOGRAM  10/2012   EF 75-70%, mod conc hypertrophy, severely calcified MV annulus, LA mildly dailted, PA peak pressure 66mmHg     Medications Prior to Admission  Medication Sig Dispense Refill Last Dose  . amLODipine (NORVASC) 5 MG tablet TAKE 1.5 TABLETS (7.5 MG TOTAL) BY MOUTH AT BEDTIME. 135 tablet 2 01/06/2018 at Unknown time  . aspirin EC 81 MG tablet Take 81 mg by mouth every evening.    01/06/2018 at Unknown time  . citalopram (CELEXA) 10 MG tablet Take 1 tablet (10 mg total) by mouth daily. (Patient taking differently: Take 10 mg by mouth at bedtime. ) 90 tablet 3 01/06/2018 at Unknown time  . clopidogrel (PLAVIX) 75 MG tablet TAKE 1 TABLET BY  MOUTH DAILY 90 tablet 3 01/07/2018 at Unknown time  . ezetimibe (ZETIA) 10 MG tablet Take 1 tablet (10 mg total) by mouth daily. NEED OV. 30 tablet 1 01/07/2018 at Unknown time  . feeding supplement, ENSURE COMPLETE, (ENSURE COMPLETE) LIQD Take 237 mLs by mouth 3 (three) times daily between meals.   01/07/2018 at Unknown time  . LIVALO 2 MG TABS TAKE 1 TABLET (2 MG TOTAL) BY MOUTH DAILY. 90 tablet 2 01/07/2018 at Unknown time  . losartan (COZAAR) 100 MG tablet TAKE 1 TABLET BY MOUTH  DAILY 90 tablet 3 01/07/2018 at Unknown time  . metoprolol tartrate (LOPRESSOR) 50 MG tablet Take 1.5 tablets twice a day (Patient taking differently: Take 75 mg by mouth 2 (two) times daily. ) 315 tablet 3 01/07/2018 at 0800  . Multiple Vitamins-Minerals (SENIOR MULTIVITAMIN PLUS PO) Take 1 tablet by mouth daily.   01/07/2018 at Unknown time  . omega-3 acid ethyl esters (LOVAZA) 1 g capsule Take 1 capsule (1 g total) by mouth every evening. 90 capsule 3 01/06/2018 at Unknown time  . pantoprazole (PROTONIX) 40 MG tablet Take 1 tablet (40 mg total) by mouth 2 (two) times daily. (Patient taking differently: Take 40 mg by mouth daily as needed (for acid reflux). ) 180 tablet 3 Past Week at Unknown time  . timolol (TIMOPTIC) 0.5 % ophthalmic solution Place 1 drop into both eyes 2 (two) times daily.    01/07/2018 at Unknown time    Inpatient Medications:  . amLODipine  7.5 mg Oral QHS  . aspirin EC  81 mg Oral QPM  . chlorhexidine  60 mL Topical Once  . citalopram  10 mg Oral Daily  . clopidogrel  75 mg Oral Daily  . diclofenac sodium  2 g Topical QID  . enoxaparin (LOVENOX) injection  40 mg Subcutaneous Q24H  . ezetimibe  10 mg Oral Daily  . feeding supplement (ENSURE ENLIVE)  237 mL Oral TID BM  . gentamicin irrigation  80 mg Irrigation On Call  . losartan  100 mg Oral Daily  . multivitamin with minerals  1 tablet Oral Daily  . omega-3 acid ethyl esters  1 g Oral QPM  . pantoprazole  40 mg Oral BID  . Pitavastatin Calcium  2 mg Oral Daily  . sodium chloride flush  3 mL Intravenous Q12H  . timolol  1 drop Both Eyes BID    Allergies:  Allergies  Allergen Reactions  . Crestor [Rosuvastatin Calcium]     Muscle pain  . Lipitor [Atorvastatin Calcium]     Muscle pain    Social History   Socioeconomic History  . Marital status: Widowed    Spouse name: Not on file  . Number of children: 4  . Years of education: Not on file  . Highest education level: Not on file  Social Needs  .  Financial resource strain: Not on file  . Food insecurity - worry: Not on file  . Food insecurity - inability: Not on file  . Transportation needs - medical: Not on file  . Transportation needs - non-medical: Not on file  Occupational History  . Not on file  Tobacco Use  . Smoking status: Former Smoker    Last attempt to quit: 05/07/1984    Years since quitting: 33.6  . Smokeless tobacco: Never Used  Substance and Sexual Activity  . Alcohol use: No  . Drug use: No  . Sexual activity: Not Currently  Other Topics Concern  . Not on  file  Social History Narrative  . Not on file     Family History  Problem Relation Age of Onset  . Throat cancer Father   . Cancer Mother   . CAD Son   . Breast cancer Sister        cervical cancer  . Colon cancer Brother        throat cancer  . Pulmonary embolism Daughter   . Hypertension Daughter      Review of Systems: All other systems reviewed and are otherwise negative except as noted above.  Physical Exam: Vitals:   01/08/18 1953 01/08/18 2155 01/09/18 0209 01/09/18 0550  BP: (!) 133/45 (!) 145/64  128/72  Pulse: (!) 57   66  Resp: 18   18  Temp: 99.1 F (37.3 C)   98 F (36.7 C)  TempSrc: Oral   Oral  SpO2: 94%   98%  Weight:    119 lb 7.8 oz (54.2 kg)  Height:   5\' 4"  (1.626 m)     GEN- The patient is elderly appearing, alert and oriented x 3 today.   HEENT: normocephalic, atraumatic; sclera clear, conjunctiva pink; hearing intact; oropharynx clear; neck supple Lungs- Clear to ausculation bilaterally, normal work of breathing.  No wheezes, rales, rhonchi Heart- Regular rate and rhythm  GI- soft, non-tender, non-distended, bowel sounds present Extremities- no clubbing, cyanosis, or edema  MS- no significant deformity or atrophy Skin- warm and dry, no rash or lesion Psych- euthymic mood, full affect Neuro- strength and sensation are intact  Labs:   Lab Results  Component Value Date   WBC 9.1 01/08/2018   HGB 14.7  01/08/2018   HCT 45.6 01/08/2018   MCV 95.4 01/08/2018   PLT 160 01/08/2018    Recent Labs  Lab 01/08/18 0639  NA 137  K 4.1  CL 102  CO2 22  BUN 15  CREATININE 1.13*  CALCIUM 9.4  GLUCOSE 113*      Radiology/Studies: Dg Chest 2 View  Result Date: 01/07/2018 CLINICAL DATA:  Chest pain EXAM: CHEST  2 VIEW COMPARISON:  06/13/2016 FINDINGS: Partially visualized right shoulder replacement. No pleural effusion. Borderline to mild cardiomegaly. No focal consolidation or effusion. Aortic atherosclerosis. No pneumothorax. Thoracic compression deformities of the mid to lower spine. IMPRESSION: No active cardiopulmonary disease.  Borderline to mild cardiomegaly. Electronically Signed   By: Donavan Foil M.D.   On: 01/07/2018 21:10   Dg Shoulder Right  Result Date: 01/08/2018 CLINICAL DATA:  Right shoulder pain EXAM: RIGHT SHOULDER - 2+ VIEW COMPARISON:  08/23/2014 FINDINGS: Status post reverse right shoulder replacement with intact hardware. Normal alignment. No obvious fracture. IMPRESSION: Status post right shoulder replacement. No definite acute osseous abnormality Electronically Signed   By: Donavan Foil M.D.   On: 01/08/2018 00:52   Ct Head Wo Contrast  Result Date: 01/08/2018 CLINICAL DATA:  84 year old female with syncope. EXAM: CT HEAD WITHOUT CONTRAST TECHNIQUE: Contiguous axial images were obtained from the base of the skull through the vertex without intravenous contrast. COMPARISON:  Head CT dated 02/10/2015 FINDINGS: Brain: There is moderate to advanced age-related atrophy and chronic microvascular ischemic changes. There is no acute intracranial hemorrhage. No mass effect or midline shift. Dilated extra-axial/subdural spaces with low attenuating fluid likely represent old subdural hematoma or hygroma and measure approximately 15 mm over the right frontal lobe. Vascular: No hyperdense vessel or unexpected calcification. Skull: Normal. Negative for fracture or focal lesion.  Sinuses/Orbits: Mild mucoperiosteal thickening of paranasal sinuses. No air-fluid  level. Mastoid air cells are clear. Other: None IMPRESSION: 1. No acute intracranial hemorrhage. 2. Moderate severe age-related atrophy and chronic microvascular ischemic changes. 3. Dilated extra-axial spaces likely representing old subdural hematoma or hygroma. Electronically Signed   By: Anner Crete M.D.   On: 01/08/2018 00:48    AFB:XUXYB bradycardia, rate 58, PR 221msec (personally reviewed)  TELEMETRY: sinus rhythm (personally reviewed)  DEVICE HISTORY: ILR implant 2015 for recurrent syncope  Assessment/Plan: 1.  Syncope/sinus arrest The patient has documented sinus arrest and syncope.  She meets criteria for PPM implantation at this time. Risks, benefits reviewed with patient and daughter who wish to proceed. Will plan for later today. Plan to explant ILR at same time. She is on BB at home with known NSVT/tachycardia previously seen on ILR.  BB is felt to be required long term.  2.  NSVT Will resume BB once PPM in place  3.  HTN Stable No change required today    Signed, Chanetta Marshall, NP 01/09/2018 7:39 AM  EP Attending  Patient seen and examined. Agree with the findings as noted above. The patient has had recurrent syncope and has known NSVT. She was wearing an ILR and passed out and was documented to have prolonged asystole. No warning before the episode occurred. I have reviewed the indications for PPM with the patient and her daughter. She is on a beta blocker but has had break through VT. We plan to proceed with PPM insertion followed by uptitration of her beta blocker for VT. I have reviewed the risks/benefits/goals/expectations of PPM insertion with the patient and she wishes to proceed.  Mikle Bosworth.D.

## 2018-01-09 NOTE — Consult Note (Addendum)
ELECTROPHYSIOLOGY CONSULT NOTE    Patient ID: Julie Bass MRN: [id number], DOB/AGE: [birth year] 84 y.o.   Admit date: 01/07/2018 Date of Consult: 01/09/2018  Primary Physician: Denita Lung, MD Primary Cardiologist: Kelly/Croitoru Electrophysiologist: Allred  Patient Profile: Julie Bass is a 84 y.o. female with a history of syncope, hypertension, PVD, NSVT who is being seen today for the evaluation of syncope at the request of Dr Bronson Ing.  HPI:  Julie Bass is a 84 y.o. female with a longstanding history of syncope and pre-syncope. She has worn numerous monitors in the past without cause identified. She underwent ILR implant in 2015. On the day of admission, she was standing at her sink and has just finished washing dishes. She turned to go sit down and awoke on the floor. She does not remember losing consciousness. ILR interrogation at that time shows a period of sinus arrest/asystole for 30 seconds.  EP has been asked to evaluate for treatment options.  She denies chest pain, palpitations, dyspnea, PND, orthopnea, nausea, vomiting, dizziness, syncope, edema, weight gain, or early satiety.  Past Medical History:  Diagnosis Date  . Arthritis   . Chronic kidney disease    KIDNEY STONES  . Dementia    BEGINNING STAGES   . Depression   . GERD (gastroesophageal reflux disease)   . Glaucoma   . Hiatal hernia   . HTN (hypertension) 10/18/2011  . Hyperlipemia 10/18/2011  . NSVT (nonsustained ventricular tachycardia) ([place]) 10/18/2011  . Osteoporosis   . PAD (peripheral artery disease) ([place]) 10/18/2011   h/o left SFA stent     Surgical History:  Past Surgical History:  Procedure Laterality Date  . ABDOMINAL HYSTERECTOMY  1973  . CARDIAC CATHETERIZATION     2012  . CHOLECYSTECTOMY    . LOOP RECORDER IMPLANT N/A 08/27/2014   Procedure: LOOP RECORDER IMPLANT;  Surgeon: Coralyn Mark, MD;  Location: [facility]ATH [facility];  Service: Cardiovascular;  Laterality: N/A;  .  LOWER EXTREMITY ANGIOGRAM  11/02/2007   stent of mid left SFA with 6x178mm EV3 self-expanding stent and 6x3 in prox region (Dr. Adora Fridge)  . LOWER EXTREMITY ANGIOGRAM    . LOWER EXTREMITY ARTERIAL DOPPLER  2013   right SFA with 50-69% diameter reduction, right PTA/peroneal occluded, L SFA prox to stent has narrowing with increased velocities >60% diameter reduction, L SFA stent patent, L ATA w/occlusive disease, bilat ABIs show mild arterial insuffiency at rest  . NM MYOCAR PERF WALL MOTION  10/2011   non-gated - normal stuy, EF 82%, normal LV wall motion  . REVERSE SHOULDER ARTHROPLASTY Right 01/26/2013   Procedure: RIGHT REVERSE TOTAL SHOULDER ARTHROPLASTY;  Surgeon: Augustin Schooling, MD;  Location: [facility];  Service: Orthopedics;  Laterality: Right;  . TRANSTHORACIC ECHOCARDIOGRAM  10/2012   EF 75-70%, mod conc hypertrophy, severely calcified MV annulus, LA mildly dailted, PA peak pressure 63mmHg     Medications Prior to Admission  Medication Sig Dispense Refill Last Dose  . amLODipine (NORVASC) 5 MG tablet TAKE 1.5 TABLETS (7.5 MG TOTAL) BY MOUTH AT BEDTIME. 135 tablet 2 01/06/2018 at Unknown time  . aspirin EC 81 MG tablet Take 81 mg by mouth every evening.    01/06/2018 at Unknown time  . citalopram (CELEXA) 10 MG tablet Take 1 tablet (10 mg total) by mouth daily. (Patient taking differently: Take 10 mg by mouth at bedtime. ) 90 tablet 3 01/06/2018 at Unknown time  . clopidogrel (PLAVIX) 75 MG tablet TAKE 1 TABLET BY  MOUTH DAILY 90 tablet 3 01/07/2018 at Unknown time  . ezetimibe (ZETIA) 10 MG tablet Take 1 tablet (10 mg total) by mouth daily. NEED OV. 30 tablet 1 01/07/2018 at Unknown time  . feeding supplement, ENSURE COMPLETE, (ENSURE COMPLETE) LIQD Take 237 mLs by mouth 3 (three) times daily between meals.   01/07/2018 at Unknown time  . LIVALO 2 MG TABS TAKE 1 TABLET (2 MG TOTAL) BY MOUTH DAILY. 90 tablet 2 01/07/2018 at Unknown time  . losartan (COZAAR) 100 MG tablet TAKE 1 TABLET BY MOUTH  DAILY 90 tablet 3 01/07/2018 at Unknown time  . metoprolol tartrate (LOPRESSOR) 50 MG tablet Take 1.5 tablets twice a day (Patient taking differently: Take 75 mg by mouth 2 (two) times daily. ) 315 tablet 3 01/07/2018 at 0800  . Multiple Vitamins-Minerals (SENIOR MULTIVITAMIN PLUS PO) Take 1 tablet by mouth daily.   01/07/2018 at Unknown time  . omega-3 acid ethyl esters (LOVAZA) 1 g capsule Take 1 capsule (1 g total) by mouth every evening. 90 capsule 3 01/06/2018 at Unknown time  . pantoprazole (PROTONIX) 40 MG tablet Take 1 tablet (40 mg total) by mouth 2 (two) times daily. (Patient taking differently: Take 40 mg by mouth daily as needed (for acid reflux). ) 180 tablet 3 Past Week at Unknown time  . timolol (TIMOPTIC) 0.5 % ophthalmic solution Place 1 drop into both eyes 2 (two) times daily.    01/07/2018 at Unknown time    Inpatient Medications:  . amLODipine  7.5 mg Oral QHS  . aspirin EC  81 mg Oral QPM  . chlorhexidine  60 mL Topical Once  . citalopram  10 mg Oral Daily  . clopidogrel  75 mg Oral Daily  . diclofenac sodium  2 g Topical QID  . enoxaparin (LOVENOX) injection  40 mg Subcutaneous Q24H  . ezetimibe  10 mg Oral Daily  . feeding supplement (ENSURE ENLIVE)  237 mL Oral TID BM  . gentamicin irrigation  80 mg Irrigation On Call  . losartan  100 mg Oral Daily  . multivitamin with minerals  1 tablet Oral Daily  . omega-3 acid ethyl esters  1 g Oral QPM  . pantoprazole  40 mg Oral BID  . Pitavastatin Calcium  2 mg Oral Daily  . sodium chloride flush  3 mL Intravenous Q12H  . timolol  1 drop Both Eyes BID    Allergies:  Allergies  Allergen Reactions  . Crestor [Rosuvastatin Calcium]     Muscle pain  . Lipitor [Atorvastatin Calcium]     Muscle pain    Social History   Socioeconomic History  . Marital status: Widowed    Spouse name: Not on file  . Number of children: 4  . Years of education: Not on file  . Highest education level: Not on file  Social Needs  .  Financial resource strain: Not on file  . Food insecurity - worry: Not on file  . Food insecurity - inability: Not on file  . Transportation needs - medical: Not on file  . Transportation needs - non-medical: Not on file  Occupational History  . Not on file  Tobacco Use  . Smoking status: Former Smoker    Last attempt to quit: 05/07/1984    Years since quitting: 33.6  . Smokeless tobacco: Never Used  Substance and Sexual Activity  . Alcohol use: No  . Drug use: No  . Sexual activity: Not Currently  Other Topics Concern  . Not on  file  Social History Narrative  . Not on file     Family History  Problem Relation Age of Onset  . Throat cancer Father   . Cancer Mother   . CAD Son   . Breast cancer Sister        cervical cancer  . Colon cancer Brother        throat cancer  . Pulmonary embolism Daughter   . Hypertension Daughter      Review of Systems: All other systems reviewed and are otherwise negative except as noted above.  Physical Exam: Vitals:   01/08/18 1953 01/08/18 2155 01/09/18 0209 01/09/18 0550  BP: (!) 133/45 (!) 145/64  128/72  Pulse: (!) 57   66  Resp: 18   18  Temp: 99.1 F (37.3 C)   98 F (36.7 C)  TempSrc: Oral   Oral  SpO2: 94%   98%  Weight:    119 lb 7.8 oz (54.2 kg)  Height:   5\' 4"  (1.626 m)     GEN- The patient is elderly appearing, alert and oriented x 3 today.   HEENT: normocephalic, atraumatic; sclera clear, conjunctiva pink; hearing intact; oropharynx clear; neck supple Lungs- Clear to ausculation bilaterally, normal work of breathing.  No wheezes, rales, rhonchi Heart- Regular rate and rhythm  GI- soft, non-tender, non-distended, bowel sounds present Extremities- no clubbing, cyanosis, or edema  MS- no significant deformity or atrophy Skin- warm and dry, no rash or lesion Psych- euthymic mood, full affect Neuro- strength and sensation are intact  Labs:   Lab Results  Component Value Date   WBC 9.1 01/08/2018   HGB 14.7  01/08/2018   HCT 45.6 01/08/2018   MCV 95.4 01/08/2018   PLT 160 01/08/2018    Recent Labs  Lab 01/08/18 0639  NA 137  K 4.1  CL 102  CO2 22  BUN 15  CREATININE 1.13*  CALCIUM 9.4  GLUCOSE 113*      Radiology/Studies: Dg Chest 2 View  Result Date: 01/07/2018 CLINICAL DATA:  Chest pain EXAM: CHEST  2 VIEW COMPARISON:  06/13/2016 FINDINGS: Partially visualized right shoulder replacement. No pleural effusion. Borderline to mild cardiomegaly. No focal consolidation or effusion. Aortic atherosclerosis. No pneumothorax. Thoracic compression deformities of the mid to lower spine. IMPRESSION: No active cardiopulmonary disease.  Borderline to mild cardiomegaly. Electronically Signed   By: Donavan Foil M.D.   On: 01/07/2018 21:10   Dg Shoulder Right  Result Date: 01/08/2018 CLINICAL DATA:  Right shoulder pain EXAM: RIGHT SHOULDER - 2+ VIEW COMPARISON:  08/23/2014 FINDINGS: Status post reverse right shoulder replacement with intact hardware. Normal alignment. No obvious fracture. IMPRESSION: Status post right shoulder replacement. No definite acute osseous abnormality Electronically Signed   By: Donavan Foil M.D.   On: 01/08/2018 00:52   Ct Head Wo Contrast  Result Date: 01/08/2018 CLINICAL DATA:  84 year old female with syncope. EXAM: CT HEAD WITHOUT CONTRAST TECHNIQUE: Contiguous axial images were obtained from the base of the skull through the vertex without intravenous contrast. COMPARISON:  Head CT dated 02/10/2015 FINDINGS: Brain: There is moderate to advanced age-related atrophy and chronic microvascular ischemic changes. There is no acute intracranial hemorrhage. No mass effect or midline shift. Dilated extra-axial/subdural spaces with low attenuating fluid likely represent old subdural hematoma or hygroma and measure approximately 15 mm over the right frontal lobe. Vascular: No hyperdense vessel or unexpected calcification. Skull: Normal. Negative for fracture or focal lesion.  Sinuses/Orbits: Mild mucoperiosteal thickening of paranasal sinuses. No air-fluid  level. Mastoid air cells are clear. Other: None IMPRESSION: 1. No acute intracranial hemorrhage. 2. Moderate severe age-related atrophy and chronic microvascular ischemic changes. 3. Dilated extra-axial spaces likely representing old subdural hematoma or hygroma. Electronically Signed   By: Anner Crete M.D.   On: 01/08/2018 00:48    WNI:OEVOJ bradycardia, rate 58, PR 256msec (personally reviewed)  TELEMETRY: sinus rhythm (personally reviewed)  DEVICE HISTORY: ILR implant 2015 for recurrent syncope  Assessment/Plan: 1.  Syncope/sinus arrest The patient has documented sinus arrest and syncope.  She meets criteria for PPM implantation at this time. Risks, benefits reviewed with patient and daughter who wish to proceed. Will plan for later today. Plan to explant ILR at same time. She is on BB at home with known NSVT/tachycardia previously seen on ILR.  BB is felt to be required long term.  2.  NSVT Will resume BB once PPM in place  3.  HTN Stable No change required today    Signed, Chanetta Marshall, NP 01/09/2018 7:39 AM  EP Attending  Patient seen and examined. Agree with the findings as noted above. The patient has had recurrent syncope and has known NSVT. She was wearing an ILR and passed out and was documented to have prolonged asystole. No warning before the episode occurred. I have reviewed the indications for PPM with the patient and her daughter. She is on a beta blocker but has had break through VT. We plan to proceed with PPM insertion followed by uptitration of her beta blocker for VT. I have reviewed the risks/benefits/goals/expectations of PPM insertion with the patient and she wishes to proceed.  Mikle Bosworth.D.

## 2018-01-09 NOTE — Interval H&P Note (Signed)
History and Physical Interval Note:  01/09/2018 10:18 AM  Julie Bass  has presented today for surgery, with the diagnosis of hb  The various methods of treatment have been discussed with the patient and family. After consideration of risks, benefits and other options for treatment, the patient has consented to  Procedure(s): LOOP RECORDER REMOVAL (N/A) PACEMAKER IMPLANT (N/A) as a surgical intervention .  The patient's history has been reviewed, patient examined, no change in status, stable for surgery.  I have reviewed the patient's chart and labs.  Questions were answered to the patient's satisfaction.     Cristopher Peru

## 2018-01-10 ENCOUNTER — Inpatient Hospital Stay (HOSPITAL_COMMUNITY): Payer: Medicare Other

## 2018-01-10 ENCOUNTER — Encounter (HOSPITAL_COMMUNITY): Payer: Self-pay | Admitting: Internal Medicine

## 2018-01-10 DIAGNOSIS — R55 Syncope and collapse: Secondary | ICD-10-CM

## 2018-01-10 DIAGNOSIS — I5030 Unspecified diastolic (congestive) heart failure: Secondary | ICD-10-CM

## 2018-01-10 DIAGNOSIS — Z950 Presence of cardiac pacemaker: Secondary | ICD-10-CM

## 2018-01-10 LAB — BASIC METABOLIC PANEL
Anion gap: 9 (ref 5–15)
BUN: 8 mg/dL (ref 6–20)
CO2: 23 mmol/L (ref 22–32)
Calcium: 9.4 mg/dL (ref 8.9–10.3)
Chloride: 108 mmol/L (ref 101–111)
Creatinine, Ser: 0.84 mg/dL (ref 0.44–1.00)
GFR calc Af Amer: >60 mL/min (ref >60)
GFR calc non Af Amer: 60 mL/min — ABNORMAL LOW (ref >60)
Glucose, Bld: 105 mg/dL — ABNORMAL HIGH (ref 65–99)
Potassium: 3.6 mmol/L (ref 3.5–5.1)
Sodium: 140 mmol/L (ref 135–145)

## 2018-01-10 LAB — GLUCOSE, CAPILLARY
Glucose-Capillary: 100 mg/dL — ABNORMAL HIGH (ref 65–99)
Glucose-Capillary: 106 mg/dL — ABNORMAL HIGH (ref 65–99)

## 2018-01-10 LAB — HEMOGLOBIN AND HEMATOCRIT, BLOOD
HCT: 43.3 % (ref 36.0–46.0)
Hemoglobin: 14.3 g/dL (ref 12.0–15.0)

## 2018-01-10 LAB — ECHOCARDIOGRAM COMPLETE
Height: 64 in
Weight: 1876.56 oz

## 2018-01-10 NOTE — Care Management Note (Signed)
Case Management Note  Patient Details  Name: Julie Bass MRN: [id number] Date of Birth: June 06, 1926  Subjective/Objective:   Syncope                Action/Plan: Patient lives at home with daughter; was informed by Annye Rusk that the plan is home with [facility] at discharge and she is refusing SNF placement. CM talked to [name] (daughter) for [facility] choices, she chose [facility]; Ad[place] with [facility] called for arrangements; DME - she has a cane and walker at home. CM will continue to follow for progression of care Expected Discharge Date:   possibly 01/11/2018               Expected Discharge Plan:  [place]  Discharge planning Services  CM Consult  Choice offered to:  Adult Children  HH Arranged:  RN, PT, OT, Nurse's Aide [place] Agency:  [facility]  Status of Service:  In process, will continue to follow  Sherrilyn Rist [phone] 01/10/2018, 12:43 PM

## 2018-01-10 NOTE — Clinical Social Work Note (Signed)
Clinical Social Work Assessment  Patient Details  Name: Julie Bass MRN: [id number] Date of Birth: 02/21/1926  Date of referral:  01/10/18               Reason for consult:  Facility Placement, Discharge Planning                Permission sought to share information with:  Family Supports Permission granted to share information::  Yes, Verbal Permission Granted  Name::     Barry Brunner  Agency::     Relationship::  Daughter  Contact Information:  [phone]  Housing/Transportation Living arrangements for the past 2 months:  [place] of Information:  Medical Team, Adult Children Patient Interpreter Needed:  None Criminal Activity/Legal Involvement Pertinent to Current Situation/Hospitalization:  No - Comment as needed Significant Relationships:  Adult Children, Other Family Members Lives with:  Other (Comment)(Grandson) Do you feel safe going back to the place where you live?  Yes Need for family participation in patient care:  Yes (Comment)  Care giving concerns:  PT recommending SNF placement once medically stable for discharge.   Social Worker assessment / plan:  Patient down for vascular ultrasound but per RN, not capable of making decisions and forgetful. CSW called patient's daughter, introduced role, and explained that PT recommendations would be discussed. Initially patient's daughter was agreeable to SNF placement at [facility] but then called back soon after and said she did not think SNF would be good for patient's psychological state. She stated she did not think patient would be able to understand that she is at a nursing home until she gets better. Patient's daughter stated she can return home with her. Patient's son-in-law does not work and can stay with her all day. Patient's daughter is agreeable to either [place] or [facility]. RNCM notified. No further concerns. CSW signing off as social work intervention is no longer needed.    Employment status:  Retired Nurse, adult PT Recommendations:  [place] / Referral to community resources:  [place]  Patient/Family's Response to care:  Patient not fully oriented. Patient's daughter prefers home health. Patient's family supportive and involved in patient's care. Patient's daughter appreciated social work intervention.  Patient/Family's Understanding of and Emotional Response to Diagnosis, Current Treatment, and Prognosis:  Patient not fully oriented. Patient's daughter has a good understanding of the reason for admission and her need for continued therapy after discharge. Patient's daughter appears happy with hospital care.  Emotional Assessment Appearance:  Appears stated age Attitude/Demeanor/Rapport:  Unable to Assess Affect (typically observed):  Unable to Assess Orientation:  Oriented to Self, Oriented to Place Alcohol / Substance use:  Never Used Psych involvement (Current and /or in the community):  No (Comment)  Discharge Needs  Concerns to be addressed:  Care Coordination Readmission within the last 30 days:  No Current discharge risk:  Cognitively Impaired, Dependent with Mobility Barriers to Discharge:  Continued Medical Work up   Candie Chroman, LCSW 01/10/2018, 12:39 PM

## 2018-01-10 NOTE — Progress Notes (Addendum)
IV and telemetry removed for discharge home. Pt stable and dress; belongings packed (home medication returned to pt from pharmacy-pitavastatin). Discharge instructions reviewed with daughter over the phone. Son-in-law present to transport pt and discharge instructions reviewed.

## 2018-01-10 NOTE — Progress Notes (Addendum)
Electrophysiology Rounding Note  Patient Name: Julie Bass Date of Encounter: 01/10/2018  Primary Cardiologist: Kelly/Croitru Electrophysiologist: Allred   Subjective   The patient is doing well today.  At this time, the patient denies chest pain, shortness of breath, or any new concerns.  Inpatient Medications    Scheduled Meds: . amLODipine  7.5 mg Oral QHS  . aspirin EC  81 mg Oral QPM  . citalopram  10 mg Oral Daily  . clopidogrel  75 mg Oral Daily  . diclofenac sodium  2 g Topical QID  . enoxaparin (LOVENOX) injection  40 mg Subcutaneous Q24H  . ezetimibe  10 mg Oral Daily  . feeding supplement (ENSURE ENLIVE)  237 mL Oral TID BM  . losartan  100 mg Oral Daily  . multivitamin with minerals  1 tablet Oral Daily  . omega-3 acid ethyl esters  1 g Oral QPM  . pantoprazole  40 mg Oral BID  . Pitavastatin Calcium  2 mg Oral Daily  . sodium chloride flush  3 mL Intravenous Q12H  . timolol  1 drop Both Eyes BID   Continuous Infusions: . sodium chloride 75 mL/hr at 01/09/18 1702   PRN Meds: acetaminophen, hydrALAZINE, nitroGLYCERIN, ondansetron (ZOFRAN) IV, ondansetron **OR** [DISCONTINUED] ondansetron (ZOFRAN) IV, oxyCODONE-acetaminophen, polyethylene glycol, zolpidem   Vital Signs    Vitals:   01/09/18 1227 01/09/18 1347 01/09/18 2300 01/10/18 0427  BP:  (!) 145/73 (!) 160/64 (!) 160/74  Pulse: 60 62  77  Resp: 18  18 18   Temp: 98.4 F (36.9 C)  98.9 F (37.2 C) 98.4 F (36.9 C)  TempSrc: Oral  Oral Oral  SpO2: 93%  95% 96%  Weight:    117 lb 4.6 oz (53.2 kg)  Height:        Intake/Output Summary (Last 24 hours) at 01/10/2018 0826 Last data filed at 01/10/2018 0600 Gross per 24 hour  Intake 3112.5 ml  Output 1200 ml  Net 1912.5 ml   Filed Weights   01/08/18 0140 01/09/18 0550 01/10/18 0427  Weight: 119 lb 12.8 oz (54.3 kg) 119 lb 7.8 oz (54.2 kg) 117 lb 4.6 oz (53.2 kg)    Physical Exam    GEN- The patient is elderly appearing, alert and  oriented x 3 today.   Head- normocephalic, atraumatic Eyes-  Sclera clear, conjunctiva pink Ears- hearing intact Oropharynx- clear Neck- supple Lungs- Clear to ausculation bilaterally, normal work of breathing Heart- Regular rate and rhythm  GI- soft, NT, ND, + BS Extremities- no clubbing, cyanosis, or edema Skin- no rash or lesion Psych- euthymic mood, full affect Neuro- strength and sensation are intact  Labs    CBC Recent Labs    01/07/18 2115 01/08/18 0639 01/10/18 0409  WBC 10.7* 9.1  --   HGB 16.4* 14.7 14.3  HCT 49.6* 45.6 43.3  MCV 96.3 95.4  --   PLT 147* 160  --    Basic Metabolic Panel Recent Labs    01/08/18 0639 01/10/18 0409  NA 137 140  K 4.1 3.6  CL 102 108  CO2 22 23  GLUCOSE 113* 105*  BUN 15 8  CREATININE 1.13* 0.84  CALCIUM 9.4 9.4   Cardiac Enzymes Recent Labs    01/08/18 0045 01/08/18 0639 01/08/18 1211  TROPONINI <0.03 <0.03 <0.03   Hemoglobin A1C Recent Labs    01/08/18 0639  HGBA1C 5.1   Fasting Lipid Panel Recent Labs    01/08/18 0639  CHOL 156  HDL 70  LDLCALC 71  TRIG 75  CHOLHDL 2.2    Telemetry    Sinus rhythm, intermittent atrial pacing  (personally reviewed)  Radiology    Dg Chest 2 View  Result Date: 01/10/2018 CLINICAL DATA:  Status post loop recorder removal EXAM: CHEST  2 VIEW COMPARISON:  01/07/2018 FINDINGS: Cardiac silhouette is normal in size. Left anterior chest wall sequential pacemaker is stable with its leads well positioned. The cardiac loop recorder has been removed. No mediastinal or hilar masses. There is no evidence of adenopathy. There are prominent bronchovascular markings, stable. There is no evidence of pneumonia or pulmonary edema. No pleural effusion or pneumothorax. Stable right shoulder reverse prosthesis. IMPRESSION: No acute cardiopulmonary disease. Electronically Signed   By: Lajean Manes M.D.   On: 01/10/2018 08:22    Assessment & Plan    1.  Syncope/sinus arrest Doing well  s/p PPM implant Device interrogation reviewed and normal CXR without ptx Routine wound care and follow up  2.  Deconditioning Will defer to primary team Pt does not feel she is able to care for herself currently  Electrophysiology team to see as needed while here. Please call with questions.   Signed, Chanetta Marshall, NP  01/10/2018, 8:26 AM   EP Attending  Patient seen and examined. Agree with above. The patient is doing well after PPM insertion. Her device has been interogated and found to be working normally. CXR shows no PTX. [place] for DC home from [employer].   Mikle Bosworth.D.

## 2018-01-10 NOTE — Progress Notes (Signed)
Physical Therapy Treatment Patient Details Name: Julie Bass MRN: [id number] DOB: 1925/12/05 Today's Date: 01/10/2018    History of Present Illness Julie Bass is a 84 y.o. female with medical history significant of hypertension, hyperlipidemia, GERD, depression, CK-3, PVD, NSVT, Loop recorder placement, dementia and syncope, who presents with syncope and fall.Marland Kitchen PNT REPORTS THAT SHE HAD SX ON R SHLD A LONG TIME AGO BUT HAD FULL USE.    PT Comments    Pt admitted with above diagnosis. Pt currently with functional limitations due to balance and endurance deficits. Pt was able to ambulate to door and back to bed with min guard assist.  Pt fatigues and is also limited by pain in left UE.  Has difficulty with transitions.  Will need SNF.  Pt will benefit from skilled PT to increase their independence and safety with mobility to allow discharge to the venue listed below.     Follow Up Recommendations  SNF     Equipment Recommendations  None recommended by PT    Recommendations for Other Services       Precautions / Restrictions Precautions Precautions: Fall Restrictions Weight Bearing Restrictions: Yes LUE Weight Bearing: Non weight bearing    Mobility  Bed Mobility Overal bed mobility: Needs Assistance Bed Mobility: Supine to Sit       Sit to supine: Min guard   General bed mobility comments: Assist to scoot pt over and up in bed as she has difficulty due to left UE pain  Transfers Overall transfer level: Needs assistance Equipment used: Rolling walker (2 wheeled) Transfers: Sit to/from Stand Sit to Stand: Min assist Stand pivot transfers: Min assist       General transfer comment: Min assist to power up; cues for hand placement; noted she braced backs of LEs against bed for stability  Ambulation/Gait Ambulation/Gait assistance: Min guard Ambulation Distance (Feet): 50 Feet Assistive device: Rolling walker (2 wheeled) Gait Pattern/deviations: Step-through  pattern;Decreased step length - right;Decreased step length - left;Decreased stride length;Trunk flexed   Gait velocity interpretation: Below normal speed for age/gender General Gait Details: Slow gait.  Short step length bilaterally.  Pt with occasional steadying assist needed. Flexed posture as she fatigues.    Stairs            Wheelchair Mobility    Modified Rankin (Stroke Patients Only)       Balance Overall balance assessment: Needs assistance Sitting-balance support: No upper extremity supported;Feet supported Sitting balance-Leahy Scale: Good     Standing balance support: Bilateral upper extremity supported;During functional activity Standing balance-Leahy Scale: Poor Standing balance comment: Needed bil UE support for stability                            Cognition Arousal/Alertness: Awake/alert Behavior During Therapy: WFL for tasks assessed/performed Overall Cognitive Status: History of cognitive impairments - at baseline                                        Exercises General Exercises - Lower Extremity Ankle Circles/Pumps: AROM;Both;10 reps;Supine Long Arc Quad: AROM;Both;10 reps;Seated    General Comments        Pertinent Vitals/Pain Pain Assessment: Faces Faces Pain Scale: Hurts even more Pain Location: Center, anterior chest with activity, and using RW Pain Descriptors / Indicators: Aching Pain Intervention(s): Limited activity within patient's tolerance;Monitored during session;Premedicated before  session;Repositioned    Home Living                      Prior Function            PT Goals (current goals can now be found in the care plan section) Acute Rehab PT Goals Patient Stated Goal: less pain Progress towards PT goals: Progressing toward goals    Frequency    Min 3X/week      PT Plan Current plan remains appropriate    Co-evaluation              AM-PAC PT "6 Clicks" Daily  Activity  Outcome Measure  Difficulty turning over in bed (including adjusting bedclothes, sheets and blankets)?: Unable Difficulty moving from lying on back to sitting on the side of the bed? : Unable Difficulty sitting down on and standing up from a chair with arms (e.g., wheelchair, bedside commode, etc,.)?: A Lot Help needed moving to and from a bed to chair (including a wheelchair)?: A Lot Help needed walking in hospital room?: A Little Help needed climbing 3-5 steps with a railing? : A Lot 6 Click Score: 11    End of Session Equipment Utilized During Treatment: Gait belt Activity Tolerance: Patient tolerated treatment well Patient left: with call bell/phone within reach;in bed;with bed alarm set Nurse Communication: Mobility status PT Visit Diagnosis: Unsteadiness on feet (R26.81);Other abnormalities of gait and mobility (R26.89);Pain Pain - Right/Left: (Center ) Pain - part of body: (Chest)     Time: 1610-9604 PT Time Calculation (min) (ACUTE ONLY): 13 min  Charges:  $Gait Training: 8-22 mins                    G Codes:       [place] [phone] (pager)    Denice Paradise 01/10/2018, 11:23 AM

## 2018-01-10 NOTE — Discharge Instructions (Signed)
° ° °  Supplemental Discharge Instructions for  Pacemaker/Defibrillator Patients  Activity No heavy lifting or vigorous activity with your left/right arm for 6 to 8 weeks.  Do not raise your left/right arm above your head for one week.  Gradually raise your affected arm as drawn below.           __   01/14/18                          01/15/18                         01/16/18                    01/17/18  NO DRIVING   WOUND CARE - Keep the wound area clean and dry.  Do not get this area wet for one week. No showers for one week; you may shower on   01/17/18  . - The tape/steri-strips on your wound will fall off; do not pull them off.  No bandage is needed on the site.  DO  NOT apply any creams, oils, or ointments to the wound area. - If you notice any drainage or discharge from the wound, any swelling or bruising at the site, or you develop a fever > 101? F after you are discharged home, call the office at once.  Special Instructions - You are still able to use cellular telephones; use the ear opposite the side where you have your pacemaker/defibrillator.  Avoid carrying your cellular phone near your device. - When traveling through airports, show security personnel your identification card to avoid being screened in the metal detectors.  Ask the security personnel to use the hand wand. - Avoid arc welding equipment, MRI testing (magnetic resonance imaging), TENS units (transcutaneous nerve stimulators).  Call the office for questions about other devices. - Avoid electrical appliances that are in poor condition or are not properly grounded. - Microwave ovens are safe to be near or to operate.

## 2018-01-10 NOTE — Progress Notes (Signed)
  Echocardiogram 2D Echocardiogram has been performed.  Technically difficult study due to small rib spacing.   Julie Bass 01/10/2018, 8:45 AM

## 2018-01-10 NOTE — Progress Notes (Signed)
Preliminary notes by tech--Duplex Carotid ultrasound exam completed. Bilateral vertebral arteries antegrade flow. Bilateral distal CCA to prox ICA calcified plaques seen. No significant hemodynamic stenosis on today's study. Hongying Adith Tejada (RDMS, RVT)

## 2018-01-10 NOTE — Discharge Summary (Signed)
Physician Discharge Summary  Julie Bass TFT:[id number] DOB: 06-30-1926 DOA: 01/07/2018  PCP: Denita Lung, MD  Admit date: 01/07/2018 Discharge date: 01/10/2018  Time spent: 45 minutes  Recommendations for Outpatient Follow-up:  Patient will be discharged to home with home health services, physical and occupational therapy, nursing, nurses aide.  Patient will need to follow up with primary care provider within one week of discharge.  Follow up with cardiology at specified time.  Patient should continue medications as prescribed.  Patient should follow a heart healthy diet.   Discharge Diagnoses:  Syncope and fall Chest pain NSVT Hyperlipidemia Essential hypertension Chronic kidney disease, stage III GERD  PVD  Discharge Condition: Stable  Diet recommendation: heart healthy  Filed Weights   01/08/18 0140 01/09/18 0550 01/10/18 0427  Weight: 54.3 kg (119 lb 12.8 oz) 54.2 kg (119 lb 7.8 oz) 53.2 kg (117 lb 4.6 oz)    History of present illness:  on 01/07/2018 by Dr. Rosanne Gutting Martinis a 84 y.o.femalewith medical history significant ofhypertension, hyperlipidemia, GERD, depression, CK-3, PVD,NSVT, Loop recorder placement, dementia and syncope, who presents withsyncopeand fall.  Per her daughter,The patient was standing at the sink when she suddenly lost consciousnessand fell at about 7:00 PM.The syncopal event was not witnessed by family.After these events, patient started having chest pain. It is located in the frontal chest, constant, sharp, moderate, nonradiating. She also has chest wall tenderness on palpation. Patient denies shortness rest, fever or chills. She has some mild dry cough. She also has right shoulder pain.Patient denies nausea, vomiting, diarrhea, abdominal pain, symptoms of UTI or unilateral weakness.She does have a [employer] loop recorder placed previously forNSVT and syncope.  Hospital Course:  Syncope and fall -Etiology likely  secondary to sinus pauses/arrest -CT head unremarkable for acute intracranial abnormalities -Patient does have a history of NSVT, vasovagal syncope and carotid artery stenosis -Cardiology consulted and appreciated-plan for loop recorder removal and implant of pacemaker today -Echocardiogram EF 70-75%, G1DD, no defect or PFO -PT/OT consulted and rec SNF- however family declined and opted for Home health -Loop recorder  interrogation showed 30 second pause at time of syncope with pacing.  Patient also noted to have 19 beats of VT back in January 2018 -Cardiology consulted and appropriated   Chest pain -Likely musculoskeletal secondary to patient's fall -Reproducible on palpation -Troponin cycled and unremarkable x3 -Echocardiogram as above -Continue Voltaren gel  NSVT -Patient has had NSVT since 2015, also has a loop recorder which has been interrogated, results listed above -Metoprolol currently held  Hyperlipidemia -Continue statin, Zetia -Lipid panel showed TC 156, HDL 70, LDL 71, triglycerides 75  Essential hypertension -continue amlodipine, losartan -Metoprolol held due to pause found on interrogation of loop recorder  Chronic kidney disease, stage III -Creatinine currently 0.84, stable  GERD  -Continue PPI  PVD -Continue aspirin, Plavix  Consultants Cardiology/EP  Procedures  Loop recorder removal Pacemaker implant  Discharge Exam: Vitals:   01/10/18 0427 01/10/18 1206  BP: (!) 160/74 (!) 159/65  Pulse: 77 70  Resp: 18 18  Temp: 98.4 F (36.9 C) 98.5 F (36.9 C)  SpO2: 96% 100%   Patient seen and examined on day of discharge.  Complains of being weak and tired.  Does not feel she can take care of herself.  Denies any current chest pain, shortness of breath, abdominal pain, nausea vomiting, diarrhea or constipation.   General: Well developed, well nourished, NAD, appears stated age  HEENT: NCAT, mucous membranes moist.  Neck: Supple,  mass on  left side  Cardiovascular: S1 S2 auscultated, no murmur, RRR  Respiratory: Clear to auscultation bilaterally with equal chest rise  Abdomen: Soft, nontender, nondistended, + bowel sounds  Extremities: warm dry without cyanosis clubbing or edema  Neuro: AAOx3, nonfocal  Psych: Normal affect and demeanor with intact judgement and insight  Discharge Instructions Discharge Instructions    Discharge instructions   Complete by:  As directed    Patient will be discharged to home with home health services, physical and occupational therapy, nursing, nurses aide.  Patient will need to follow up with primary care provider within one week of discharge.  Follow up with cardiology at specified time.  Patient should continue medications as prescribed.  Patient should follow a heart healthy diet.     Allergies as of 01/10/2018      Reactions   Crestor [rosuvastatin Calcium]    Muscle pain   Lipitor [atorvastatin Calcium]    Muscle pain      Medication List    STOP taking these medications   metoprolol tartrate 50 MG tablet Commonly known as:  LOPRESSOR     TAKE these medications   amLODipine 5 MG tablet Commonly known as:  NORVASC TAKE 1.5 TABLETS (7.5 MG TOTAL) BY MOUTH AT BEDTIME.   aspirin EC 81 MG tablet Take 81 mg by mouth every evening.   citalopram 10 MG tablet Commonly known as:  CELEXA Take 1 tablet (10 mg total) by mouth daily. What changed:  when to take this   clopidogrel 75 MG tablet Commonly known as:  PLAVIX TAKE 1 TABLET BY MOUTH DAILY   ezetimibe 10 MG tablet Commonly known as:  ZETIA Take 1 tablet (10 mg total) by mouth daily. NEED OV.   feeding supplement (ENSURE COMPLETE) Liqd Take 237 mLs by mouth 3 (three) times daily between meals.   LIVALO 2 MG Tabs Generic drug:  Pitavastatin Calcium TAKE 1 TABLET (2 MG TOTAL) BY MOUTH DAILY.   losartan 100 MG tablet Commonly known as:  COZAAR TAKE 1 TABLET BY MOUTH DAILY   omega-3 acid ethyl esters 1 g  capsule Commonly known as:  LOVAZA Take 1 capsule (1 g total) by mouth every evening.   pantoprazole 40 MG tablet Commonly known as:  PROTONIX Take 1 tablet (40 mg total) by mouth 2 (two) times daily. What changed:    when to take this  reasons to take this   SENIOR MULTIVITAMIN PLUS PO Take 1 tablet by mouth daily.   timolol 0.5 % ophthalmic solution Commonly known as:  TIMOPTIC Place 1 drop into both eyes 2 (two) times daily.      Allergies  Allergen Reactions  . Crestor [Rosuvastatin Calcium]     Muscle pain  . Lipitor [Atorvastatin Calcium]     Muscle pain   Follow-up Information    [facility] Follow up on 01/20/2018.   Specialty:  Cardiology Why:  at [facility] information: [address] [place]       Denita Lung, MD Follow up.   Specialty:  Family Medicine Contact information: [place] [address] [place], [place] Follow up.   Specialty:  [place] Why:  They will do your home health care at your home Contact information: [address] [place] [address] [phone]  The results of significant diagnostics from this hospitalization (including imaging, microbiology, ancillary and laboratory) are listed below for reference.    Significant Diagnostic Studies: Dg Chest 2 View  Result Date: 01/10/2018 CLINICAL DATA:  Status post loop recorder removal EXAM: CHEST  2 VIEW COMPARISON:  01/07/2018 FINDINGS: Cardiac silhouette is normal in size. Left anterior chest wall sequential pacemaker is stable with its leads well positioned. The cardiac loop recorder has been removed. No mediastinal or hilar masses. There is no evidence of adenopathy. There are prominent bronchovascular markings, stable. There is no evidence of pneumonia or pulmonary edema. No pleural effusion or  pneumothorax. Stable right shoulder reverse prosthesis. IMPRESSION: No acute cardiopulmonary disease. Electronically Signed   By: Lajean Manes M.D.   On: 01/10/2018 08:22   Dg Chest 2 View  Result Date: 01/07/2018 CLINICAL DATA:  Chest pain EXAM: CHEST  2 VIEW COMPARISON:  06/13/2016 FINDINGS: Partially visualized right shoulder replacement. No pleural effusion. Borderline to mild cardiomegaly. No focal consolidation or effusion. Aortic atherosclerosis. No pneumothorax. Thoracic compression deformities of the mid to lower spine. IMPRESSION: No active cardiopulmonary disease.  Borderline to mild cardiomegaly. Electronically Signed   By: Donavan Foil M.D.   On: 01/07/2018 21:10   Dg Shoulder Right  Result Date: 01/08/2018 CLINICAL DATA:  Right shoulder pain EXAM: RIGHT SHOULDER - 2+ VIEW COMPARISON:  08/23/2014 FINDINGS: Status post reverse right shoulder replacement with intact hardware. Normal alignment. No obvious fracture. IMPRESSION: Status post right shoulder replacement. No definite acute osseous abnormality Electronically Signed   By: Donavan Foil M.D.   On: 01/08/2018 00:52   Ct Head Wo Contrast  Result Date: 01/08/2018 CLINICAL DATA:  84 year old female with syncope. EXAM: CT HEAD WITHOUT CONTRAST TECHNIQUE: Contiguous axial images were obtained from the base of the skull through the vertex without intravenous contrast. COMPARISON:  Head CT dated 02/10/2015 FINDINGS: Brain: There is moderate to advanced age-related atrophy and chronic microvascular ischemic changes. There is no acute intracranial hemorrhage. No mass effect or midline shift. Dilated extra-axial/subdural spaces with low attenuating fluid likely represent old subdural hematoma or hygroma and measure approximately 15 mm over the right frontal lobe. Vascular: No hyperdense vessel or unexpected calcification. Skull: Normal. Negative for fracture or focal lesion. Sinuses/Orbits: Mild mucoperiosteal thickening of paranasal sinuses. No  air-fluid level. Mastoid air cells are clear. Other: None IMPRESSION: 1. No acute intracranial hemorrhage. 2. Moderate severe age-related atrophy and chronic microvascular ischemic changes. 3. Dilated extra-axial spaces likely representing old subdural hematoma or hygroma. Electronically Signed   By: Anner Crete M.D.   On: 01/08/2018 00:48    Microbiology: Recent Results (from the past 240 hour(s))  Surgical PCR screen     Status: None   Collection Time: 01/09/18  8:09 AM  Result Value Ref Range Status   MRSA, PCR NEGATIVE NEGATIVE Final   Staphylococcus aureus NEGATIVE NEGATIVE Final    Comment: (NOTE) The Xpert SA Assay (FDA approved for NASAL specimens in patients 38 years of age and older), is one component of a comprehensive surveillance program. It is not intended to diagnose infection nor to guide or monitor treatment. Performed at [facility], [place] [address] [place], [place] [address]      Labs: Basic Metabolic Panel: Recent Labs  Lab 01/07/18 2115 01/08/18 0639 01/10/18 0409  NA 139 137 140  K 4.6 4.1 3.6  CL 103 102 108  CO2 25 22 23   GLUCOSE 118* 113* 105*  BUN 14 15 8   CREATININE  1.20* 1.13* 0.84  CALCIUM 10.2 9.4 9.4   Liver Function Tests: No results for input(s): AST, ALT, ALKPHOS, BILITOT, PROT, ALBUMIN in the last 168 hours. No results for input(s): LIPASE, AMYLASE in the last 168 hours. No results for input(s): AMMONIA in the last 168 hours. CBC: Recent Labs  Lab 01/07/18 2115 01/08/18 0639 01/10/18 0409  WBC 10.7* 9.1  --   HGB 16.4* 14.7 14.3  HCT 49.6* 45.6 43.3  MCV 96.3 95.4  --   PLT 147* 160  --    Cardiac Enzymes: Recent Labs  Lab 01/08/18 0045 01/08/18 0639 01/08/18 1211  TROPONINI <0.03 <0.03 <0.03   BNP: BNP (last 3 results) No results for input(s): BNP in the last 8760 hours.  ProBNP (last 3 results) No results for input(s): PROBNP in the last 8760 hours.  CBG: Recent Labs  Lab 01/07/18 2120  01/08/18 0746 01/09/18 0601 01/10/18 0600 01/10/18 0732  GLUCAP 115* 107* 103* 100* 106*       Signed:  Matt Delpizzo  [facility] 01/10/2018, 1:33 PM

## 2018-01-10 NOTE — Progress Notes (Signed)
Physical Therapy Evaluation (Late entry for evaluation performed on 2/24)  Clinical Impression: Pt admitted with above diagnosis. Pt currently with functional limitations due to the deficits listed below (see PT Problem List). Presents with functional dependencies as outlined below; Is home alone large portions of the day; Rec SNF for post-acute rehab to maximize independence and safety with mobility prior to getting home;  Pt will benefit from skilled PT to increase their independence and safety with mobility to allow discharge to the venue listed below.         01/08/18 1300  PT Visit Information  Last PT Received On 01/08/18  Assistance Needed +1  History of Present Illness AADYA KINDLER is a 84 y.o. female with medical history significant of hypertension, hyperlipidemia, GERD, depression, CK-3, PVD, NSVT, Loop recorder placement, dementia and syncope, who presents with syncope and fall.Marland Kitchen PNT REPORTS THAT SHE HAD SX ON R SHLD A LONG TIME AGO BUT HAD FULL USE.  Precautions  Precautions Fall  Home Living  Family/patient expects to be discharged to: Private residence  Living Arrangements Other relatives  Available Help at Discharge Family (per chart review, grandson lives with her - he travels for work, she is home alone for most of the day)  Type of [place] to enter  Entrance Stairs-Number of Steps 5  Entrance Stairs-Rails Right;Left;Can reach both  [place] One level  Bathroom Shower/Tub Tub/shower unit  Tax adviser - 2 wheels (reports she has multiple RWs)  Additional Comments Grandson lives with her, but he travels a lot for work; daughter helps too  Prior Function  Level of Independence Needs assistance  Gait / Transfers Assistance Needed with RW  ADL's / Homemaking Assistance Needed Daughter assists with shower and occasionally with meals  Communication  Communication No difficulties  Pain Assessment  Pain  Assessment Faces  Faces Pain Scale 6  Pain Location Center, anterior chest with activity, and using RW  Pain Descriptors / Indicators Aching  Pain Intervention(s) Monitored during session  Cognition  Arousal/Alertness Awake/alert  Behavior During Therapy WFL for tasks assessed/performed  Overall Cognitive Status History of cognitive impairments - at baseline  Upper Extremity Assessment  Upper Extremity Assessment Defer to OT evaluation  Lower Extremity Assessment  Lower Extremity Assessment Generalized weakness  Bed Mobility  Overal bed mobility Needs Assistance  Bed Mobility Supine to Sit  Supine to sit Min guard  General bed mobility comments Cues for techqniue  Transfers  Overall transfer level Needs assistance  Equipment used Rolling walker (2 wheeled)  Transfers Sit to/from Stand  Sit to Stand Min assist  General transfer comment Min assist to power up; cues for hand placement; noted she braced backs of LEs against bed for stability  Ambulation/Gait  Ambulation/Gait assistance Min guard  Ambulation Distance (Feet) 20 Feet  Assistive device Rolling walker (2 wheeled)  Gait Pattern/deviations Step-through pattern;Decreased step length - right;Decreased step length - left  General Gait Details reports considerable pain center anterior chest when using her UEs for support on RW  Balance  Overall balance assessment Needs assistance  Standing balance support Bilateral upper extremity supported;During functional activity  Standing balance-Leahy Scale Poor  PT - End of Session  Equipment Utilized During Treatment Gait belt  Activity Tolerance Patient tolerated treatment well  Patient left in chair;with call bell/phone within reach;with chair alarm set  Nurse Communication Mobility status  PT Assessment  PT Recommendation/Assessment Patient needs continued PT services  PT Visit Diagnosis Unsteadiness on feet (R26.81);Other abnormalities of gait and mobility (R26.89);Pain  Pain -  Right/Left (Center )  Pain - part of body (Chest)  PT Problem List Decreased strength;Decreased activity tolerance;Decreased balance;Decreased mobility;Decreased coordination;Decreased cognition;Decreased knowledge of use of DME;Decreased safety awareness;Decreased knowledge of precautions;Pain  Barriers to Discharge Decreased caregiver support  Barriers to Discharge Comments Noting pt with dementia, and now syncope; It is worth considering starting the conversation re: longer-term living situation for Ms. Hassell Done  PT Plan  PT Frequency (ACUTE ONLY) Min 3X/week  PT Treatment/Interventions (ACUTE ONLY) DME instruction;Gait training;Stair training;Functional mobility training;Therapeutic activities;Therapeutic exercise;Balance training;Patient/family education  AM-PAC PT "6 Clicks" Daily Activity Outcome Measure  Difficulty turning over in bed (including adjusting bedclothes, sheets and blankets)? 2  Difficulty moving from lying on back to sitting on the side of the bed?  1  Difficulty sitting down on and standing up from a chair with arms (e.g., wheelchair, bedside commode, etc,.)? 2  Help needed moving to and from a bed to chair (including a wheelchair)? 2  Help needed walking in hospital room? 3  Help needed climbing 3-5 steps with a railing?  2  6 Click Score 12  Mobility G Code  CL  PT Recommendation  Follow Up Recommendations SNF  PT equipment None recommended by PT  Individuals Consulted  Consulted and Agree with Results and Recommendations Patient  Acute Rehab PT Goals  Patient Stated Goal less pain  PT Goal Formulation With patient  Time For Goal Achievement 01/22/18  Potential to Achieve Goals Good  PT Time Calculation  PT Start Time (ACUTE ONLY) 1217  PT Stop Time (ACUTE ONLY) 1237  PT Time Calculation (min) (ACUTE ONLY) 20 min  PT General Charges  $$ ACUTE PT VISIT 1 Visit  PT Evaluation  $PT Eval Moderate Complexity 1 Mod  Written Expression  Dominant Hand Right    Roney Marion, PT  Acute Rehabilitation Services Pager [phone] Office [phone]

## 2018-01-11 ENCOUNTER — Other Ambulatory Visit: Payer: Self-pay

## 2018-01-11 ENCOUNTER — Telehealth: Payer: Self-pay | Admitting: Family Medicine

## 2018-01-11 NOTE — Progress Notes (Signed)
This encounter was created in error - please disregard.

## 2018-01-11 NOTE — Addendum Note (Signed)
Addended by: Oswaldo Done on: 5/46/5035 04:41 PM   Modules accepted: Level of Service, SmartSet

## 2018-01-11 NOTE — Telephone Encounter (Signed)
Called and spoke to pt's daughter, [name], concerning recent hospital stay. She states pt was ok. Current and discharge meds were reconciled and the only change was they D/C metoprolol tartrate. An hospital follow up was made and she was asked to bring all pt's meds with her. Daughter has a very good understanding of all medications. She is aware of after hour policy.

## 2018-01-15 DIAGNOSIS — K219 Gastro-esophageal reflux disease without esophagitis: Secondary | ICD-10-CM | POA: Diagnosis not present

## 2018-01-15 DIAGNOSIS — N183 Chronic kidney disease, stage 3 (moderate): Secondary | ICD-10-CM | POA: Diagnosis not present

## 2018-01-15 DIAGNOSIS — M1990 Unspecified osteoarthritis, unspecified site: Secondary | ICD-10-CM | POA: Diagnosis not present

## 2018-01-15 DIAGNOSIS — I129 Hypertensive chronic kidney disease with stage 1 through stage 4 chronic kidney disease, or unspecified chronic kidney disease: Secondary | ICD-10-CM | POA: Diagnosis not present

## 2018-01-15 DIAGNOSIS — Z7902 Long term (current) use of antithrombotics/antiplatelets: Secondary | ICD-10-CM | POA: Diagnosis not present

## 2018-01-15 DIAGNOSIS — Z48812 Encounter for surgical aftercare following surgery on the circulatory system: Secondary | ICD-10-CM | POA: Diagnosis not present

## 2018-01-15 DIAGNOSIS — Z7982 Long term (current) use of aspirin: Secondary | ICD-10-CM | POA: Diagnosis not present

## 2018-01-15 DIAGNOSIS — E785 Hyperlipidemia, unspecified: Secondary | ICD-10-CM | POA: Diagnosis not present

## 2018-01-15 DIAGNOSIS — Z87891 Personal history of nicotine dependence: Secondary | ICD-10-CM | POA: Diagnosis not present

## 2018-01-15 DIAGNOSIS — M810 Age-related osteoporosis without current pathological fracture: Secondary | ICD-10-CM | POA: Diagnosis not present

## 2018-01-15 DIAGNOSIS — Z950 Presence of cardiac pacemaker: Secondary | ICD-10-CM | POA: Diagnosis not present

## 2018-01-15 DIAGNOSIS — I739 Peripheral vascular disease, unspecified: Secondary | ICD-10-CM | POA: Diagnosis not present

## 2018-01-15 DIAGNOSIS — D119 Benign neoplasm of major salivary gland, unspecified: Secondary | ICD-10-CM | POA: Diagnosis not present

## 2018-01-15 DIAGNOSIS — Z9181 History of falling: Secondary | ICD-10-CM | POA: Diagnosis not present

## 2018-01-16 ENCOUNTER — Telehealth: Payer: Self-pay | Admitting: Cardiovascular Disease

## 2018-01-16 DIAGNOSIS — Z950 Presence of cardiac pacemaker: Secondary | ICD-10-CM | POA: Diagnosis not present

## 2018-01-16 DIAGNOSIS — N183 Chronic kidney disease, stage 3 (moderate): Secondary | ICD-10-CM | POA: Diagnosis not present

## 2018-01-16 DIAGNOSIS — Z7902 Long term (current) use of antithrombotics/antiplatelets: Secondary | ICD-10-CM | POA: Diagnosis not present

## 2018-01-16 DIAGNOSIS — I129 Hypertensive chronic kidney disease with stage 1 through stage 4 chronic kidney disease, or unspecified chronic kidney disease: Secondary | ICD-10-CM | POA: Diagnosis not present

## 2018-01-16 DIAGNOSIS — Z48812 Encounter for surgical aftercare following surgery on the circulatory system: Secondary | ICD-10-CM | POA: Diagnosis not present

## 2018-01-16 DIAGNOSIS — M1990 Unspecified osteoarthritis, unspecified site: Secondary | ICD-10-CM | POA: Diagnosis not present

## 2018-01-16 DIAGNOSIS — Z87891 Personal history of nicotine dependence: Secondary | ICD-10-CM | POA: Diagnosis not present

## 2018-01-16 DIAGNOSIS — M810 Age-related osteoporosis without current pathological fracture: Secondary | ICD-10-CM | POA: Diagnosis not present

## 2018-01-16 DIAGNOSIS — K219 Gastro-esophageal reflux disease without esophagitis: Secondary | ICD-10-CM | POA: Diagnosis not present

## 2018-01-16 DIAGNOSIS — Z9181 History of falling: Secondary | ICD-10-CM | POA: Diagnosis not present

## 2018-01-16 DIAGNOSIS — D119 Benign neoplasm of major salivary gland, unspecified: Secondary | ICD-10-CM | POA: Diagnosis not present

## 2018-01-16 DIAGNOSIS — Z7982 Long term (current) use of aspirin: Secondary | ICD-10-CM | POA: Diagnosis not present

## 2018-01-16 DIAGNOSIS — E785 Hyperlipidemia, unspecified: Secondary | ICD-10-CM | POA: Diagnosis not present

## 2018-01-16 DIAGNOSIS — I739 Peripheral vascular disease, unspecified: Secondary | ICD-10-CM | POA: Diagnosis not present

## 2018-01-16 NOTE — Telephone Encounter (Signed)
Will forward to dr Claiborne Billings for okay to order PT.

## 2018-01-16 NOTE — Telephone Encounter (Signed)
New Message     Needs verbal order for home health physical therapy 1 a week for 1 week and 2x a week for 3 weeks

## 2018-01-17 NOTE — Telephone Encounter (Signed)
New message :     Pt needs skilled nursing orders  Called in to  [facility] home health # [phone]

## 2018-01-17 NOTE — Telephone Encounter (Signed)
SPOKE Julie Bass , VERBAL ORDER GIVEN FOR  SKILLED NURSING  ORDERS AND PHYSICAL THERAPY VISIT.

## 2018-01-18 NOTE — Telephone Encounter (Signed)
ok 

## 2018-01-19 ENCOUNTER — Ambulatory Visit: Payer: Medicare Other | Admitting: Family Medicine

## 2018-01-20 ENCOUNTER — Ambulatory Visit: Payer: Medicare Other | Admitting: Cardiovascular Disease

## 2018-01-20 ENCOUNTER — Ambulatory Visit: Payer: Medicare Other

## 2018-01-20 ENCOUNTER — Encounter: Payer: Self-pay | Admitting: Cardiovascular Disease

## 2018-01-20 ENCOUNTER — Telehealth: Payer: Self-pay | Admitting: *Deleted

## 2018-01-20 DIAGNOSIS — R5383 Other fatigue: Secondary | ICD-10-CM | POA: Diagnosis not present

## 2018-01-20 DIAGNOSIS — G4719 Other hypersomnia: Secondary | ICD-10-CM

## 2018-01-20 DIAGNOSIS — I739 Peripheral vascular disease, unspecified: Secondary | ICD-10-CM

## 2018-01-20 DIAGNOSIS — I442 Atrioventricular block, complete: Secondary | ICD-10-CM | POA: Insufficient documentation

## 2018-01-20 DIAGNOSIS — G4733 Obstructive sleep apnea (adult) (pediatric): Secondary | ICD-10-CM | POA: Insufficient documentation

## 2018-01-20 DIAGNOSIS — I472 Ventricular tachycardia: Secondary | ICD-10-CM

## 2018-01-20 DIAGNOSIS — I4729 Other ventricular tachycardia: Secondary | ICD-10-CM

## 2018-01-20 NOTE — Progress Notes (Signed)
Cardiology Office Note:    Date:  01/20/2018   ID:  Julie Bass, DOB Mar 17, 1926, MRN [id number]  PCP:  Julie Lung, MD  Cardiologist:  Julie Majestic, MD   Referring MD: Julie Lung, MD   No chief complaint on file. Pacemaker wound check  History of Present Illness:    Julie Bass is a 84 y.o. female with a hx of syncope related to prolonged paroxysmal complete heart block lasting for 30 seconds, now roughly 10 days status post implantation of a dual-chamber permanent pacemaker Julie Bass.  Due to her advanced age and deconditioning her recovery has been slow and she is living with her daughter Julie Bass, who works in our office.  With her syncopal episode she fell on one shoulder which was quite painful, but is much better now.  She has a long-standing history of PVD with previous angioplasty and stents placed in the lower extremity arterial circulation.  She has preserved left ventricular systolic function and no known coronary artery disease.  She has a history of hypertension and a previously implanted loop recorder has documented nonsustained ventricular tachycardia, for which she was taking a low-dose of beta-blocker.  This was stopped when she presented with bradycardia and has not yet been restarted.  She has treated hyperlipidemia and systemic hypertension.  She has moderate Alzheimer's versus vascular dementia and is followed by Dr. Leta Bass.  Despite discontinuation of her beta-blocker and implantation of the pacemaker she continues to complain of fatigue and some daytime hypersomnolence.  Her daughter has witnessed episodes that she thinks might be consistent with obstructive sleep apnea.  She does not have leg edema, is not aware of palpitations, has not experienced any new episodes of syncope.  She does complain of exertional dyspnea and is very sedentary.  Her shoulders no longer bother her.  Her device is a Aeronautical engineer 8 DR-T.  Both leads are Biotronik  leads.  (Restricted are excellent as below: Atrial lead P wave 6.1 mV, threshold 0.8 mV at 0.4 ms, impedance 448 ohms Ventricular lead R waves 12.6 mV, threshold 0.7 V at 0.4 ms, impedance 604 ohms.  She has had 2% atrial pacing and she has not required ventricular pacing.  She has had a handful of episodes of mode switched over to short for EGM recording.  At this point estimated generator longevity is 9 years and 7 months, likely to improve when outputs are decreased to chronic levels.   Past Medical History:  Diagnosis Date  . Arthritis   . Chronic kidney disease    KIDNEY STONES  . Dementia    BEGINNING STAGES   . Depression   . GERD (gastroesophageal reflux disease)   . Glaucoma   . Hiatal hernia   . HTN (hypertension) 10/18/2011  . Hyperlipemia 10/18/2011  . NSVT (nonsustained ventricular tachycardia) ([place]) 10/18/2011  . Osteoporosis   . PAD (peripheral artery disease) ([place]) 10/18/2011   h/o left SFA stent    Past Surgical History:  Procedure Laterality Date  . ABDOMINAL HYSTERECTOMY  1973  . CARDIAC CATHETERIZATION     2012  . CHOLECYSTECTOMY    . LOOP RECORDER IMPLANT N/A 08/27/2014   Procedure: LOOP RECORDER IMPLANT;  Surgeon: Coralyn Mark, MD;  Location: [facility];  Service: Cardiovascular;  Laterality: N/A;  . LOOP RECORDER REMOVAL N/A 01/09/2018   Procedure: LOOP RECORDER REMOVAL;  Surgeon: Evans Lance, MD;  Location: [facility];  Service: Cardiovascular;  Laterality: N/A;  .  LOWER EXTREMITY ANGIOGRAM  11/02/2007   stent of mid left SFA with 6x130mm EV3 self-expanding stent and 6x3 in prox region (Dr. Adora Fridge)  . LOWER EXTREMITY ANGIOGRAM    . LOWER EXTREMITY ARTERIAL DOPPLER  2013   right SFA with 50-69% diameter reduction, right PTA/peroneal occluded, L SFA prox to stent has narrowing with increased velocities >60% diameter reduction, L SFA stent patent, L ATA w/occlusive disease, bilat ABIs show mild arterial insuffiency at rest  . NM MYOCAR PERF WALL  MOTION  10/2011   non-gated - normal stuy, EF 82%, normal LV wall motion  . PACEMAKER IMPLANT N/A 01/09/2018   Procedure: PACEMAKER IMPLANT;  Surgeon: Evans Lance, MD;  Location: [facility];  Service: Cardiovascular;  Laterality: N/A;  . REVERSE SHOULDER ARTHROPLASTY Right 01/26/2013   Procedure: RIGHT REVERSE TOTAL SHOULDER ARTHROPLASTY;  Surgeon: Augustin Schooling, MD;  Location: [facility];  Service: Orthopedics;  Laterality: Right;  . TRANSTHORACIC ECHOCARDIOGRAM  10/2012   EF 75-70%, mod conc hypertrophy, severely calcified MV annulus, LA mildly dailted, PA peak pressure 57mmHg    Current Medications: No outpatient medications have been marked as taking for the 01/20/18 encounter (Office Visit) with Sanda Klein, MD.     Allergies:   Crestor [rosuvastatin calcium] and Lipitor [atorvastatin calcium]   Social History   Socioeconomic History  . Marital status: Widowed    Spouse name: Not on file  . Number of children: 4  . Years of education: Not on file  . Highest education level: Not on file  Social Needs  . Financial resource strain: Not on file  . Food insecurity - worry: Not on file  . Food insecurity - inability: Not on file  . Transportation needs - medical: Not on file  . Transportation needs - non-medical: Not on file  Occupational History  . Not on file  Tobacco Use  . Smoking status: Former Smoker    Last attempt to quit: 05/07/1984    Years since quitting: 33.7  . Smokeless tobacco: Never Used  Substance and Sexual Activity  . Alcohol use: No  . Drug use: No  . Sexual activity: Not Currently  Other Topics Concern  . Not on file  Social History Narrative  . Not on file     Family History: The patient's family history includes Breast cancer in her sister; CAD in her son; Cancer in her mother; Colon cancer in her brother; Hypertension in her daughter; Pulmonary embolism in her daughter; Throat cancer in her father.  ROS:   Please see the history of  present illness.     All other systems reviewed and are negative.  EKGs/Labs/Other Studies Reviewed:    The following studies were reviewed today: Implantation notes, records from recent hospitalization  EKG:  EKG is not ordered today.    Recent Labs: 04/05/2017: ALT 13; TSH 2.33 01/08/2018: Platelets 160 01/10/2018: BUN 8; Creatinine, Ser 0.84; Hemoglobin 14.3; Potassium 3.6; Sodium 140  Recent Lipid Panel    Component Value Date/Time   CHOL 156 01/08/2018 0639   TRIG 75 01/08/2018 0639   HDL 70 01/08/2018 0639   CHOLHDL 2.2 01/08/2018 0639   VLDL 15 01/08/2018 0639   LDLCALC 71 01/08/2018 0639    Physical Exam:    VS:  There were no vitals taken for this visit.    Wt Readings from Last 3 Encounters:  01/10/18 117 lb 4.6 oz (53.2 kg)  09/02/17 127 lb (57.6 kg)  04/07/17 127 lb (57.6  kg)    The left subclavian surgical site for the pacemaker implantation in the parasternal loop recorder removal site both appear very healthy.  There is no evidence of redness, swelling, drainage or discharge or bleeding.  Steri-Strips are still in place in both locations, firmly apposed to the skin. GEN: Thin, but well nourished, well developed in no acute distress HEENT: Normal NECK: No JVD; No carotid bruits LYMPHATICS: No lymphadenopathy CARDIAC: RRR, no murmurs, rubs, gallops.  P2 component second heart sound is very loud RESPIRATORY:  Clear to auscultation without rales, wheezing or rhonchi  ABDOMEN: Soft, non-tender, non-distended MUSCULOSKELETAL:  No edema; No deformity  SKIN: Warm and dry NEUROLOGIC:  Alert and oriented x 3 PSYCHIATRIC:  Normal affect   ASSESSMENT:    1. Daytime hypersomnolence   2. Other fatigue   3. CHB (complete heart block) (H[facility])   4. NSVT (nonsustained ventricular tachycardia) ([place])   5. OSA (obstructive sleep apnea)   6. PAD (peripheral artery disease) ([facility])    PLAN:    In order of problems listed above:  1. CHB: She had paroxysmal heart block and  has not had meaningful recurrence of ventricular pacing since device implantation. 2. NSVT: Currently off beta-blockers without any high ventricular rates recorded since device implantation.  We will leave the decision regarding restarting beta-blockers to Dr. Claiborne Billings.  Her daughter, Julie Bass, was hopeful that stopping the beta-blockers will lead to improvement in her fatigue, but this has not happened. 3. OSA: It is possible that she has obstructive sleep apnea which explains her daytime hypersomnolence and may be responsible for her loud P2 as a sign of pulmonary hypertension (although her echo did not show pulmonary hypertension).  We will schedule for an home sleep study. 4. PPM: Normal device function.  Set up for remote monitoring.  Bring back to the office in 3 months to reduce lead outputs.  Mrs. Eckmann has a preferred electrical device for stimulation of her feet, this was tested at maximum output today and did not show any interference with either the atrial or ventricular channel. 5. PAD: Currently asymptomatic but she is very sedentary.  On lipid-lowering therapy.   Medication Adjustments/Labs and Tests Ordered: Current medicines are reviewed at length with the patient today.  Concerns regarding medicines are outlined above.  Orders Placed This Encounter  Procedures  . Home sleep test   No orders of the defined types were placed in this encounter.   Signed, Sanda Klein, MD  01/20/2018 11:16 AM    [place]

## 2018-01-20 NOTE — Telephone Encounter (Signed)
Incoming call from [place] at [facility]. She wanted to verfiy that verbal orders were given for skilled nursing and physical therapy. She has been advised that verbal orders were given on 01/17/18 to Julie Bass.

## 2018-01-20 NOTE — Patient Instructions (Addendum)
Your physician has recommended that you have a sleep study. This test records several body functions during sleep, including: brain activity, eye movement, oxygen and carbon dioxide blood levels, heart rate and rhythm, breathing rate and rhythm, the flow of air through your mouth and nose, snoring, body muscle movements, and chest and belly movement.  Please keep your follow-up appointment with Dr Sallyanne Kuster in May.

## 2018-01-21 DIAGNOSIS — D119 Benign neoplasm of major salivary gland, unspecified: Secondary | ICD-10-CM | POA: Diagnosis not present

## 2018-01-21 DIAGNOSIS — M1990 Unspecified osteoarthritis, unspecified site: Secondary | ICD-10-CM | POA: Diagnosis not present

## 2018-01-21 DIAGNOSIS — K219 Gastro-esophageal reflux disease without esophagitis: Secondary | ICD-10-CM | POA: Diagnosis not present

## 2018-01-21 DIAGNOSIS — Z48812 Encounter for surgical aftercare following surgery on the circulatory system: Secondary | ICD-10-CM | POA: Diagnosis not present

## 2018-01-21 DIAGNOSIS — Z87891 Personal history of nicotine dependence: Secondary | ICD-10-CM | POA: Diagnosis not present

## 2018-01-21 DIAGNOSIS — Z950 Presence of cardiac pacemaker: Secondary | ICD-10-CM | POA: Diagnosis not present

## 2018-01-21 DIAGNOSIS — N183 Chronic kidney disease, stage 3 (moderate): Secondary | ICD-10-CM | POA: Diagnosis not present

## 2018-01-21 DIAGNOSIS — I129 Hypertensive chronic kidney disease with stage 1 through stage 4 chronic kidney disease, or unspecified chronic kidney disease: Secondary | ICD-10-CM | POA: Diagnosis not present

## 2018-01-21 DIAGNOSIS — Z7982 Long term (current) use of aspirin: Secondary | ICD-10-CM | POA: Diagnosis not present

## 2018-01-21 DIAGNOSIS — Z7902 Long term (current) use of antithrombotics/antiplatelets: Secondary | ICD-10-CM | POA: Diagnosis not present

## 2018-01-21 DIAGNOSIS — Z9181 History of falling: Secondary | ICD-10-CM | POA: Diagnosis not present

## 2018-01-21 DIAGNOSIS — I739 Peripheral vascular disease, unspecified: Secondary | ICD-10-CM | POA: Diagnosis not present

## 2018-01-21 DIAGNOSIS — M810 Age-related osteoporosis without current pathological fracture: Secondary | ICD-10-CM | POA: Diagnosis not present

## 2018-01-21 DIAGNOSIS — E785 Hyperlipidemia, unspecified: Secondary | ICD-10-CM | POA: Diagnosis not present

## 2018-01-23 ENCOUNTER — Telehealth: Payer: Self-pay | Admitting: *Deleted

## 2018-01-23 DIAGNOSIS — I459 Conduction disorder, unspecified: Secondary | ICD-10-CM

## 2018-01-23 DIAGNOSIS — W19XXXS Unspecified fall, sequela: Secondary | ICD-10-CM

## 2018-01-23 NOTE — Telephone Encounter (Signed)
REQUESTING AN ORDER FOR AN RAISED TOILET SEAT FOR PATIENT . ORDERED PLACED

## 2018-01-23 NOTE — Addendum Note (Signed)
Addended by: Raiford Simmonds on: 01/23/2018 03:00 PM   Modules accepted: Orders

## 2018-01-24 DIAGNOSIS — I739 Peripheral vascular disease, unspecified: Secondary | ICD-10-CM | POA: Diagnosis not present

## 2018-01-24 DIAGNOSIS — Z48812 Encounter for surgical aftercare following surgery on the circulatory system: Secondary | ICD-10-CM | POA: Diagnosis not present

## 2018-01-24 DIAGNOSIS — I129 Hypertensive chronic kidney disease with stage 1 through stage 4 chronic kidney disease, or unspecified chronic kidney disease: Secondary | ICD-10-CM | POA: Diagnosis not present

## 2018-01-24 DIAGNOSIS — Z7902 Long term (current) use of antithrombotics/antiplatelets: Secondary | ICD-10-CM | POA: Diagnosis not present

## 2018-01-24 DIAGNOSIS — Z7982 Long term (current) use of aspirin: Secondary | ICD-10-CM | POA: Diagnosis not present

## 2018-01-24 DIAGNOSIS — Z950 Presence of cardiac pacemaker: Secondary | ICD-10-CM | POA: Diagnosis not present

## 2018-01-24 DIAGNOSIS — M1990 Unspecified osteoarthritis, unspecified site: Secondary | ICD-10-CM | POA: Diagnosis not present

## 2018-01-24 DIAGNOSIS — M810 Age-related osteoporosis without current pathological fracture: Secondary | ICD-10-CM | POA: Diagnosis not present

## 2018-01-24 DIAGNOSIS — Z9181 History of falling: Secondary | ICD-10-CM | POA: Diagnosis not present

## 2018-01-24 DIAGNOSIS — E785 Hyperlipidemia, unspecified: Secondary | ICD-10-CM | POA: Diagnosis not present

## 2018-01-24 DIAGNOSIS — Z87891 Personal history of nicotine dependence: Secondary | ICD-10-CM | POA: Diagnosis not present

## 2018-01-24 DIAGNOSIS — N183 Chronic kidney disease, stage 3 (moderate): Secondary | ICD-10-CM | POA: Diagnosis not present

## 2018-01-24 DIAGNOSIS — K219 Gastro-esophageal reflux disease without esophagitis: Secondary | ICD-10-CM | POA: Diagnosis not present

## 2018-01-24 DIAGNOSIS — D119 Benign neoplasm of major salivary gland, unspecified: Secondary | ICD-10-CM | POA: Diagnosis not present

## 2018-01-25 LAB — CUP PACEART INCLINIC DEVICE CHECK
Date Time Interrogation Session: 20190313133538
Implantable Lead Implant Date: 20190225
Implantable Lead Implant Date: 20190225
Implantable Lead Location: 753859
Implantable Lead Location: 753860
Implantable Lead Model: 377
Implantable Lead Model: 377
Implantable Lead Serial Number: 80514208
Implantable Lead Serial Number: 80598782
Implantable Pulse Generator Implant Date: 20190225
Pulse Gen Model: 407145
Pulse Gen Serial Number: 69245949

## 2018-01-27 DIAGNOSIS — Z7902 Long term (current) use of antithrombotics/antiplatelets: Secondary | ICD-10-CM | POA: Diagnosis not present

## 2018-01-27 DIAGNOSIS — Z9181 History of falling: Secondary | ICD-10-CM | POA: Diagnosis not present

## 2018-01-27 DIAGNOSIS — K219 Gastro-esophageal reflux disease without esophagitis: Secondary | ICD-10-CM | POA: Diagnosis not present

## 2018-01-27 DIAGNOSIS — M810 Age-related osteoporosis without current pathological fracture: Secondary | ICD-10-CM | POA: Diagnosis not present

## 2018-01-27 DIAGNOSIS — Z7982 Long term (current) use of aspirin: Secondary | ICD-10-CM | POA: Diagnosis not present

## 2018-01-27 DIAGNOSIS — D119 Benign neoplasm of major salivary gland, unspecified: Secondary | ICD-10-CM | POA: Diagnosis not present

## 2018-01-27 DIAGNOSIS — M1990 Unspecified osteoarthritis, unspecified site: Secondary | ICD-10-CM | POA: Diagnosis not present

## 2018-01-27 DIAGNOSIS — N183 Chronic kidney disease, stage 3 (moderate): Secondary | ICD-10-CM | POA: Diagnosis not present

## 2018-01-27 DIAGNOSIS — Z87891 Personal history of nicotine dependence: Secondary | ICD-10-CM | POA: Diagnosis not present

## 2018-01-27 DIAGNOSIS — I129 Hypertensive chronic kidney disease with stage 1 through stage 4 chronic kidney disease, or unspecified chronic kidney disease: Secondary | ICD-10-CM | POA: Diagnosis not present

## 2018-01-27 DIAGNOSIS — E785 Hyperlipidemia, unspecified: Secondary | ICD-10-CM | POA: Diagnosis not present

## 2018-01-27 DIAGNOSIS — I739 Peripheral vascular disease, unspecified: Secondary | ICD-10-CM | POA: Diagnosis not present

## 2018-01-27 DIAGNOSIS — Z48812 Encounter for surgical aftercare following surgery on the circulatory system: Secondary | ICD-10-CM | POA: Diagnosis not present

## 2018-01-27 DIAGNOSIS — Z950 Presence of cardiac pacemaker: Secondary | ICD-10-CM | POA: Diagnosis not present

## 2018-01-30 ENCOUNTER — Other Ambulatory Visit: Payer: Self-pay | Admitting: Cardiovascular Disease

## 2018-01-30 DIAGNOSIS — Z7902 Long term (current) use of antithrombotics/antiplatelets: Secondary | ICD-10-CM | POA: Diagnosis not present

## 2018-01-30 DIAGNOSIS — I129 Hypertensive chronic kidney disease with stage 1 through stage 4 chronic kidney disease, or unspecified chronic kidney disease: Secondary | ICD-10-CM | POA: Diagnosis not present

## 2018-01-30 DIAGNOSIS — M1990 Unspecified osteoarthritis, unspecified site: Secondary | ICD-10-CM | POA: Diagnosis not present

## 2018-01-30 DIAGNOSIS — Z48812 Encounter for surgical aftercare following surgery on the circulatory system: Secondary | ICD-10-CM | POA: Diagnosis not present

## 2018-01-30 DIAGNOSIS — Z7982 Long term (current) use of aspirin: Secondary | ICD-10-CM | POA: Diagnosis not present

## 2018-01-30 DIAGNOSIS — K219 Gastro-esophageal reflux disease without esophagitis: Secondary | ICD-10-CM | POA: Diagnosis not present

## 2018-01-30 DIAGNOSIS — D119 Benign neoplasm of major salivary gland, unspecified: Secondary | ICD-10-CM | POA: Diagnosis not present

## 2018-01-30 DIAGNOSIS — M810 Age-related osteoporosis without current pathological fracture: Secondary | ICD-10-CM | POA: Diagnosis not present

## 2018-01-30 DIAGNOSIS — E785 Hyperlipidemia, unspecified: Secondary | ICD-10-CM | POA: Diagnosis not present

## 2018-01-30 DIAGNOSIS — N183 Chronic kidney disease, stage 3 (moderate): Secondary | ICD-10-CM | POA: Diagnosis not present

## 2018-01-30 DIAGNOSIS — I739 Peripheral vascular disease, unspecified: Secondary | ICD-10-CM | POA: Diagnosis not present

## 2018-02-03 DIAGNOSIS — N183 Chronic kidney disease, stage 3 (moderate): Secondary | ICD-10-CM | POA: Diagnosis not present

## 2018-02-03 DIAGNOSIS — I739 Peripheral vascular disease, unspecified: Secondary | ICD-10-CM | POA: Diagnosis not present

## 2018-02-03 DIAGNOSIS — E785 Hyperlipidemia, unspecified: Secondary | ICD-10-CM | POA: Diagnosis not present

## 2018-02-03 DIAGNOSIS — Z7902 Long term (current) use of antithrombotics/antiplatelets: Secondary | ICD-10-CM | POA: Diagnosis not present

## 2018-02-03 DIAGNOSIS — Z7982 Long term (current) use of aspirin: Secondary | ICD-10-CM | POA: Diagnosis not present

## 2018-02-03 DIAGNOSIS — M1990 Unspecified osteoarthritis, unspecified site: Secondary | ICD-10-CM | POA: Diagnosis not present

## 2018-02-03 DIAGNOSIS — I129 Hypertensive chronic kidney disease with stage 1 through stage 4 chronic kidney disease, or unspecified chronic kidney disease: Secondary | ICD-10-CM | POA: Diagnosis not present

## 2018-02-03 DIAGNOSIS — Z48812 Encounter for surgical aftercare following surgery on the circulatory system: Secondary | ICD-10-CM | POA: Diagnosis not present

## 2018-02-03 DIAGNOSIS — K219 Gastro-esophageal reflux disease without esophagitis: Secondary | ICD-10-CM | POA: Diagnosis not present

## 2018-02-03 DIAGNOSIS — D119 Benign neoplasm of major salivary gland, unspecified: Secondary | ICD-10-CM | POA: Diagnosis not present

## 2018-02-03 DIAGNOSIS — M810 Age-related osteoporosis without current pathological fracture: Secondary | ICD-10-CM | POA: Diagnosis not present

## 2018-02-07 DIAGNOSIS — I739 Peripheral vascular disease, unspecified: Secondary | ICD-10-CM | POA: Diagnosis not present

## 2018-02-07 DIAGNOSIS — E785 Hyperlipidemia, unspecified: Secondary | ICD-10-CM | POA: Diagnosis not present

## 2018-02-07 DIAGNOSIS — Z7902 Long term (current) use of antithrombotics/antiplatelets: Secondary | ICD-10-CM | POA: Diagnosis not present

## 2018-02-07 DIAGNOSIS — Z7982 Long term (current) use of aspirin: Secondary | ICD-10-CM | POA: Diagnosis not present

## 2018-02-07 DIAGNOSIS — Z48812 Encounter for surgical aftercare following surgery on the circulatory system: Secondary | ICD-10-CM | POA: Diagnosis not present

## 2018-02-07 DIAGNOSIS — M810 Age-related osteoporosis without current pathological fracture: Secondary | ICD-10-CM | POA: Diagnosis not present

## 2018-02-07 DIAGNOSIS — I129 Hypertensive chronic kidney disease with stage 1 through stage 4 chronic kidney disease, or unspecified chronic kidney disease: Secondary | ICD-10-CM | POA: Diagnosis not present

## 2018-02-07 DIAGNOSIS — K219 Gastro-esophageal reflux disease without esophagitis: Secondary | ICD-10-CM | POA: Diagnosis not present

## 2018-02-07 DIAGNOSIS — N183 Chronic kidney disease, stage 3 (moderate): Secondary | ICD-10-CM | POA: Diagnosis not present

## 2018-02-07 DIAGNOSIS — M1990 Unspecified osteoarthritis, unspecified site: Secondary | ICD-10-CM | POA: Diagnosis not present

## 2018-02-07 DIAGNOSIS — D119 Benign neoplasm of major salivary gland, unspecified: Secondary | ICD-10-CM | POA: Diagnosis not present

## 2018-02-08 ENCOUNTER — Encounter (HOSPITAL_BASED_OUTPATIENT_CLINIC_OR_DEPARTMENT_OTHER): Payer: Medicare Other

## 2018-02-09 ENCOUNTER — Other Ambulatory Visit: Payer: Self-pay | Admitting: Cardiovascular Disease

## 2018-02-09 ENCOUNTER — Telehealth: Payer: Self-pay | Admitting: Cardiovascular Disease

## 2018-02-09 DIAGNOSIS — I739 Peripheral vascular disease, unspecified: Secondary | ICD-10-CM | POA: Diagnosis not present

## 2018-02-09 DIAGNOSIS — N183 Chronic kidney disease, stage 3 (moderate): Secondary | ICD-10-CM | POA: Diagnosis not present

## 2018-02-09 DIAGNOSIS — I129 Hypertensive chronic kidney disease with stage 1 through stage 4 chronic kidney disease, or unspecified chronic kidney disease: Secondary | ICD-10-CM | POA: Diagnosis not present

## 2018-02-09 DIAGNOSIS — Z48812 Encounter for surgical aftercare following surgery on the circulatory system: Secondary | ICD-10-CM | POA: Diagnosis not present

## 2018-02-09 DIAGNOSIS — Z7982 Long term (current) use of aspirin: Secondary | ICD-10-CM | POA: Diagnosis not present

## 2018-02-09 DIAGNOSIS — D119 Benign neoplasm of major salivary gland, unspecified: Secondary | ICD-10-CM | POA: Diagnosis not present

## 2018-02-09 DIAGNOSIS — Z7902 Long term (current) use of antithrombotics/antiplatelets: Secondary | ICD-10-CM | POA: Diagnosis not present

## 2018-02-09 DIAGNOSIS — M1990 Unspecified osteoarthritis, unspecified site: Secondary | ICD-10-CM | POA: Diagnosis not present

## 2018-02-09 DIAGNOSIS — M810 Age-related osteoporosis without current pathological fracture: Secondary | ICD-10-CM | POA: Diagnosis not present

## 2018-02-09 DIAGNOSIS — E785 Hyperlipidemia, unspecified: Secondary | ICD-10-CM | POA: Diagnosis not present

## 2018-02-09 DIAGNOSIS — K219 Gastro-esophageal reflux disease without esophagitis: Secondary | ICD-10-CM | POA: Diagnosis not present

## 2018-02-09 NOTE — Telephone Encounter (Signed)
Spoke with Julie Bass, PTA @ [facility].  Patient has elevated BP of 190/88, checked a few times in different arms.  She did not take amlodipine 2.5mg  last night (only 5mg  - missed extra 1/2 tab) She has no complaints She is in no distress, no chest pain, no SOB, no blurred vision She is calling since this BP is out of their parameters.   Will notify MD

## 2018-02-09 NOTE — Telephone Encounter (Signed)
New Message °

## 2018-02-09 NOTE — Telephone Encounter (Signed)
.    Suspect she did not take her medications, leading to the increase in blood pressure.  Resume medications as prescribed

## 2018-02-14 DIAGNOSIS — M1990 Unspecified osteoarthritis, unspecified site: Secondary | ICD-10-CM | POA: Diagnosis not present

## 2018-02-14 DIAGNOSIS — E785 Hyperlipidemia, unspecified: Secondary | ICD-10-CM | POA: Diagnosis not present

## 2018-02-14 DIAGNOSIS — M810 Age-related osteoporosis without current pathological fracture: Secondary | ICD-10-CM | POA: Diagnosis not present

## 2018-02-14 DIAGNOSIS — Z7982 Long term (current) use of aspirin: Secondary | ICD-10-CM | POA: Diagnosis not present

## 2018-02-14 DIAGNOSIS — Z7902 Long term (current) use of antithrombotics/antiplatelets: Secondary | ICD-10-CM | POA: Diagnosis not present

## 2018-02-14 DIAGNOSIS — I739 Peripheral vascular disease, unspecified: Secondary | ICD-10-CM | POA: Diagnosis not present

## 2018-02-14 DIAGNOSIS — N183 Chronic kidney disease, stage 3 (moderate): Secondary | ICD-10-CM | POA: Diagnosis not present

## 2018-02-14 DIAGNOSIS — K219 Gastro-esophageal reflux disease without esophagitis: Secondary | ICD-10-CM | POA: Diagnosis not present

## 2018-02-14 DIAGNOSIS — D119 Benign neoplasm of major salivary gland, unspecified: Secondary | ICD-10-CM | POA: Diagnosis not present

## 2018-02-14 DIAGNOSIS — Z48812 Encounter for surgical aftercare following surgery on the circulatory system: Secondary | ICD-10-CM | POA: Diagnosis not present

## 2018-02-14 DIAGNOSIS — I129 Hypertensive chronic kidney disease with stage 1 through stage 4 chronic kidney disease, or unspecified chronic kidney disease: Secondary | ICD-10-CM | POA: Diagnosis not present

## 2018-02-17 DIAGNOSIS — E785 Hyperlipidemia, unspecified: Secondary | ICD-10-CM | POA: Diagnosis not present

## 2018-02-17 DIAGNOSIS — Z7982 Long term (current) use of aspirin: Secondary | ICD-10-CM | POA: Diagnosis not present

## 2018-02-17 DIAGNOSIS — M1990 Unspecified osteoarthritis, unspecified site: Secondary | ICD-10-CM | POA: Diagnosis not present

## 2018-02-17 DIAGNOSIS — D119 Benign neoplasm of major salivary gland, unspecified: Secondary | ICD-10-CM | POA: Diagnosis not present

## 2018-02-17 DIAGNOSIS — Z7902 Long term (current) use of antithrombotics/antiplatelets: Secondary | ICD-10-CM | POA: Diagnosis not present

## 2018-02-17 DIAGNOSIS — I739 Peripheral vascular disease, unspecified: Secondary | ICD-10-CM | POA: Diagnosis not present

## 2018-02-17 DIAGNOSIS — N183 Chronic kidney disease, stage 3 (moderate): Secondary | ICD-10-CM | POA: Diagnosis not present

## 2018-02-17 DIAGNOSIS — M810 Age-related osteoporosis without current pathological fracture: Secondary | ICD-10-CM | POA: Diagnosis not present

## 2018-02-17 DIAGNOSIS — Z48812 Encounter for surgical aftercare following surgery on the circulatory system: Secondary | ICD-10-CM | POA: Diagnosis not present

## 2018-02-17 DIAGNOSIS — K219 Gastro-esophageal reflux disease without esophagitis: Secondary | ICD-10-CM | POA: Diagnosis not present

## 2018-02-17 DIAGNOSIS — I129 Hypertensive chronic kidney disease with stage 1 through stage 4 chronic kidney disease, or unspecified chronic kidney disease: Secondary | ICD-10-CM | POA: Diagnosis not present

## 2018-02-20 ENCOUNTER — Other Ambulatory Visit: Payer: Self-pay | Admitting: *Deleted

## 2018-02-20 ENCOUNTER — Encounter (HOSPITAL_BASED_OUTPATIENT_CLINIC_OR_DEPARTMENT_OTHER): Payer: Medicare Other

## 2018-02-20 DIAGNOSIS — N183 Chronic kidney disease, stage 3 unspecified: Secondary | ICD-10-CM

## 2018-02-20 DIAGNOSIS — I442 Atrioventricular block, complete: Secondary | ICD-10-CM

## 2018-02-20 DIAGNOSIS — Z79899 Other long term (current) drug therapy: Secondary | ICD-10-CM

## 2018-02-20 DIAGNOSIS — I10 Essential (primary) hypertension: Secondary | ICD-10-CM

## 2018-02-21 ENCOUNTER — Ambulatory Visit: Payer: Medicare Other | Admitting: Cardiovascular Disease

## 2018-02-21 ENCOUNTER — Encounter: Payer: Self-pay | Admitting: Cardiovascular Disease

## 2018-02-21 VITALS — BP 187/79 | HR 61 | Ht 60.0 in | Wt 123.0 lb

## 2018-02-21 DIAGNOSIS — I459 Conduction disorder, unspecified: Secondary | ICD-10-CM | POA: Diagnosis not present

## 2018-02-21 DIAGNOSIS — Z79899 Other long term (current) drug therapy: Secondary | ICD-10-CM | POA: Diagnosis not present

## 2018-02-21 DIAGNOSIS — G4719 Other hypersomnia: Secondary | ICD-10-CM

## 2018-02-21 DIAGNOSIS — I472 Ventricular tachycardia: Secondary | ICD-10-CM | POA: Diagnosis not present

## 2018-02-21 DIAGNOSIS — I442 Atrioventricular block, complete: Secondary | ICD-10-CM

## 2018-02-21 DIAGNOSIS — I4729 Other ventricular tachycardia: Secondary | ICD-10-CM

## 2018-02-21 DIAGNOSIS — E7800 Pure hypercholesterolemia, unspecified: Secondary | ICD-10-CM

## 2018-02-21 DIAGNOSIS — I10 Essential (primary) hypertension: Secondary | ICD-10-CM | POA: Diagnosis not present

## 2018-02-21 DIAGNOSIS — N183 Chronic kidney disease, stage 3 (moderate): Secondary | ICD-10-CM | POA: Diagnosis not present

## 2018-02-21 MED ORDER — METOPROLOL SUCCINATE ER 50 MG PO TB24: 50.0000 mg | ORAL_TABLET | Freq: Every day | ORAL | 3 refills | Status: DC

## 2018-02-21 MED ORDER — AMLODIPINE BESYLATE 10 MG PO TABS: 10.0000 mg | ORAL_TABLET | Freq: Every day | ORAL | 3 refills | Status: DC

## 2018-02-21 NOTE — Progress Notes (Signed)
Patient ID: Julie Bass, female   DOB: [birth year], 84 y.o.   MRN: [id number]    PCP: Dr. Jill Alexanders  HPI: Julie Bass is a 84 y.o. female who presents to the office today for an 21monthfollow-up cardiology evaluation.  Ms. MLegerehas a history of hypertension, hyperlipidemia, peripheral vascular disease, as well as episodes of nonsustained ventricular tachycardia, for, which she's been treated with beta blocker therapy.  An echo Doppler study in December 2013 showed hyperdynamic LV function with an ejection fraction of at least 776% grade 1 diastolic dysfunction, and dynamic obstruction in the mid cavity of the left ventricle with a gradient of 10 mm.  She had severely calcified mitral annulus and mild left atrial dilatation.  She has documented nonsustained ventricular tachycardia, which was picked up on a remote event monitor, and she has tolerated titration of her beta blocker therapy.  She also has had difficulty with statin intolerance for hyperlipidemia, but has tolerated livalo 2 mg daily.  She also has history of hypertension and has been on losartan 100 mg in addition to her metoprolol.  She has peripheral vascular disease and is status post stenting of her left SFA in the past.  She has one vessel runoff bilaterally and has moderate right SFA disease.  Her last LE Doppler study was in August 2013 , which showed occluded right PTA and peroneal occluded left PTA, left ATA with reconstitution of the dorsalis pedis artery.  ABIs were 0.88 on the right and 0.86 on the left.  She has been on aspirin 81 mg, and Plavix 75 mg   A 2 year followup echo Doppler study on 05/15/2014 showed an ejection fraction of 65-70% and there was grade 1 diastolic dysfunction.  There was moderate mitral annular calcification.  There was no mention of any dynamic obstruction.  Lower extremity Doppler studies revealed a patent left SFA stent with prior disease noted in several vessels, not significantly changed  from 2013.  She was hospitalized on 08/23/2014 after developing a syncopal spell.  She was not significantly orthostatic to her hospitalization.  Upon presentation her blood pressure was 170/58.  Her ECG showed sinus rhythm with mild ST changes laterally, and first degree heart block.  She was noted to have a single 15 beat run of nonsustained VT.  She ultimately underwent implantation of a loop recorder on 08/27/2014.  She underwent carotid duplex imaging and findings suggest 1-39% internal carotid artery stenosis bilaterally. The left vertebral artery is patent with antegrade flow but the the right vertebral artery was not able to be visualized.  After discharge, the loop recorder detected 2 episodes of questionable transient asystole.  Upon review of these tracings with Dr. CSallyanne Kusterit appears that these may very well be artifactual.  One episode was recorded on October 14, and another on October 16.  The episode in October 16 tach, rate of less than over 30 seconds.  The patient was with her daughter, who is my nurse at the time.  She denies any symptoms at this time.  Specifically, she denied any lightheadedness, and one would expect that with a >30 second pause she would have been significantly symptomatic and this would have also been noted by her daughter.  She denies any major episodes of dizziness but if she gets up quickly or lies down fast.  A CT of her abdomen showed atherosclerotic plaque throughout her abdominal aorta with suggestion of a tiny 0.5 x 0.2 cm atherosclerotic ulcer.  She  was also noted to have plaque that was irregular throughout the right superficial femoral artery and also plaque in the popliteal artery, not resulting in hemodynamically significance stenosis.  She had single-vessel runoff to the right lower leg and foot.  The right anterior tibial artery with atretic reconstitution of both posterior tibial and peroneal vascular distributions.  The right dorsalis pedis artery was  patent to the level of the forefoot.  She had stents noted in the distal aspect of her left common femoral artery and single vessel runoff to the left lower leg and foot.  There was occlusion of both the left anterior, posterior tibial arteries.  The peroneal artery was found to reconstitute a dorsalis pedis artery at the level of the hindfoot.  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    She has underone neurologic evaluation with Dr. Corwin Levins and is felt to have moderate  dementia felt to be Alzheimer's  versus vascular dementia.  A CT scan of her head revealed mild cortical atrophy and moderately advanced small vessel ischemic changes involving the periventricular white matter with remote infarction in the right frontal lobe.   In October 2017, she was found to have an episode of irregular nonsustained wide complex tachycardia with a maximum rate at 167 bpm and ultimately reverted back to sinus rhythm.  She was completely asymptomatic and this occurred while she was in bed.  At that time, her metoprolol dose, which had been 75 Mill grams twice a day was increased to 100 mg in the morning and 75 mg at night.  She has continued to take losartan 100 mg daily as well as amlodipine 5 mg for blood pressure control.  She has felt well on the increased beta blocker regimen.  She has a history of hyperlipidemia and has been able to tolerate Livalo but could not tolerate other statins.  Her current Livalo dose is  mg daily and she also takes Zetia 10 mg.  She is on gabapentin for neuropathy.  She also is on Celexa.  She underwent an echo Doppler study yesterday, 10/13/2016 which showed an EF of 55-60%.  There was mild LVH.  There was grade 2 diastolic dysfunction with abnormal tissue Doppler suggesting high ventricular filling pressures.  Her aortic valve mobility was restricted but she did not have stenosis.  There was  mitral annular calcification.  There is mild left atrial dilatation.  There was mild increased pulmonary pressure.    She has had issues with short-term memory loss. When  I last saw her in May 2018 she denied any episodes of chest pain or shortness of breath.  She was fatigued and was sleeping a lot.  She denied any episodes of tachycardia, presyncope or syncope.    A loop recorder assessment from 01/15/2017 through 03/15/2017 did not show any episodes of atrial fibrillation, tachycardia, bradycardia, pauses.  Laboratory was done 2 days ago.  TSH 2.33.  Hemoglobin 14.9, hematocrit 45.7.  BUN 18, creatinine 1.24.  LFTs were normal.  On Livalo  cholesterol eas186, HDL 79, triglycerides 85, and LDL 90.    Since I last saw her, she developed an episode of syncope in February 2019 and was found to have complete heart block with a prolonged sinus pause for close to 30 seconds.  She was seen by Dr. Lovena Le during her hospitalization and underwent insertion of a permanent pacemaker [employer] 8 DR-T.  He was  subsequently seen in the office by Dr. Sallyanne Kuster who follows her pacemaker.  Remotely, she had previously experienced episodes of nonsustained VT.  Her beta-blocker had been held prior to insertion of her pacemaker after she presented with her prolonged pause and there was plans to later potentially reinitiate therapy.  There is also been concern in the past of probable obstructive sleep apnea but the patient had still never been tested.  He denies any chest pain.  She denies any further episodes of presyncope since her pacemaker implantation.  At times at night she is aware that her heart rate beats faster.  She presents for evaluation.  Past Medical History:  Diagnosis Date  . Arthritis   . Chronic kidney disease    KIDNEY STONES  . Dementia    BEGINNING STAGES   . Depression   . GERD (gastroesophageal reflux disease)   . Glaucoma   . Hiatal hernia   . HTN (hypertension) 10/18/2011  .  Hyperlipemia 10/18/2011  . NSVT (nonsustained ventricular tachycardia) ([place]) 10/18/2011  . Osteoporosis   . PAD (peripheral artery disease) ([place]) 10/18/2011   h/o left SFA stent    Past Surgical History:  Procedure Laterality Date  . ABDOMINAL HYSTERECTOMY  1973  . CARDIAC CATHETERIZATION     2012  . CHOLECYSTECTOMY    . LOOP RECORDER IMPLANT N/A 08/27/2014   Procedure: LOOP RECORDER IMPLANT;  Surgeon: Coralyn Mark, MD;  Location: [facility];  Service: Cardiovascular;  Laterality: N/A;  . LOOP RECORDER REMOVAL N/A 01/09/2018   Procedure: LOOP RECORDER REMOVAL;  Surgeon: Evans Lance, MD;  Location: [facility];  Service: Cardiovascular;  Laterality: N/A;  . LOWER EXTREMITY ANGIOGRAM  11/02/2007   stent of mid left SFA with 6x120m EV3 self-expanding stent and 6x3 in prox region (Dr. JAdora Fridge  . LOWER EXTREMITY ANGIOGRAM    . LOWER EXTREMITY ARTERIAL DOPPLER  2013   right SFA with 50-69% diameter reduction, right PTA/peroneal occluded, L SFA prox to stent has narrowing with increased velocities >60% diameter reduction, L SFA stent patent, L ATA w/occlusive disease, bilat ABIs show mild arterial insuffiency at rest  . NM MYOCAR PERF WALL MOTION  10/2011   non-gated - normal stuy, EF 82%, normal LV wall motion  . PACEMAKER IMPLANT N/A 01/09/2018   Procedure: PACEMAKER IMPLANT;  Surgeon: TEvans Lance MD;  [facility];  Service: Cardiovascular;  Laterality: N/A;  . REVERSE SHOULDER ARTHROPLASTY Right 01/26/2013   Procedure: RIGHT REVERSE TOTAL SHOULDER ARTHROPLASTY;  Surgeon: SAugustin Schooling MD;  [facility]  Service: Orthopedics;  Laterality: Right;  . TRANSTHORACIC ECHOCARDIOGRAM  10/2012   EF 75-70%, mod conc hypertrophy, severely calcified MV annulus, LA mildly dailted, PA peak pressure 345mg    Allergies  Allergen Reactions  . Crestor [Rosuvastatin Calcium]     Muscle pain  . Lipitor [Atorvastatin Calcium]     Muscle pain    Current  Outpatient Medications  Medication Sig Dispense Refill  . amLODipine (NORVASC) 10 MG tablet Take 1 tablet (10 mg total) by mouth daily. 90 tablet 3  . aspirin EC 81 MG tablet Take 81 mg by mouth every evening.     . citalopram (CELEXA) 10 MG tablet Take 1 tablet (10 mg total) by mouth daily. (Patient taking differently: Take 10 mg by mouth at bedtime. ) 90 tablet 3  . clopidogrel (PLAVIX) 75 MG tablet TAKE 1 TABLET BY MOUTH DAILY 90 tablet 3  . ezetimibe (ZETIA)  10 MG tablet TAKE 1 TABLET (10 MG TOTAL) BY MOUTH DAILY. NEED OV. 30 tablet 1  . feeding supplement, ENSURE COMPLETE, (ENSURE COMPLETE) LIQD Take 237 mLs by mouth 3 (three) times daily between meals.    Marland Kitchen LIVALO 2 MG TABS TAKE 1 TABLET (2 MG TOTAL) BY MOUTH DAILY. 90 tablet 2  . losartan (COZAAR) 100 MG tablet TAKE 1 TABLET BY MOUTH DAILY 90 tablet 3  . Multiple Vitamins-Minerals (SENIOR MULTIVITAMIN PLUS PO) Take 1 tablet by mouth daily.    Marland Kitchen omega-3 acid ethyl esters (LOVAZA) 1 g capsule Take 1 capsule (1 g total) by mouth every evening. 90 capsule 3  . pantoprazole (PROTONIX) 40 MG tablet Take 1 tablet (40 mg total) by mouth 2 (two) times daily. (Patient taking differently: Take 40 mg by mouth daily as needed (for acid reflux). ) 180 tablet 3  . timolol (TIMOPTIC) 0.5 % ophthalmic solution Place 1 drop into both eyes 2 (two) times daily.     . metoprolol succinate (TOPROL-XL) 50 MG 24 hr tablet Take 1 tablet (50 mg total) by mouth daily. Take with or immediately following a meal. 90 tablet 3   No current facility-administered medications for this visit.     Social History   Socioeconomic History  . Marital status: Widowed    Spouse name: Not on file  . Number of children: 4  . Years of education: Not on file  . Highest education level: Not on file  Occupational History  . Not on file  Social Needs  . Financial resource strain: Not on file  . Food insecurity:    Worry: Not on file    Inability: Not on file  .  Transportation needs:    Medical: Not on file    Non-medical: Not on file  Tobacco Use  . Smoking status: Former Smoker    Last attempt to quit: 05/07/1984    Years since quitting: 33.8  . Smokeless tobacco: Never Used  Substance and Sexual Activity  . Alcohol use: No  . Drug use: No  . Sexual activity: Not Currently  Lifestyle  . Physical activity:    Days per week: Not on file    Minutes per session: Not on file  . Stress: Not on file  Relationships  . Social connections:    Talks on phone: Not on file    Gets together: Not on file    Attends religious service: Not on file    Active member of club or organization: Not on file    Attends meetings of clubs or organizations: Not on file    Relationship status: Not on file  . Intimate partner violence:    Fear of current or ex partner: Not on file    Emotionally abused: Not on file    Physically abused: Not on file    Forced sexual activity: Not on file  Other Topics Concern  . Not on file  Social History Narrative  . Not on file    Family History  Problem Relation Age of Onset  . Throat cancer Father   . Cancer Mother   . CAD Son   . Breast cancer Sister        cervical cancer  . Colon cancer Brother        throat cancer  . Pulmonary embolism Daughter   . 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 Redmond Baseman Pulmonary: Negative; No cough, wheezing, shortness of breath, hemoptysis Cardiovascular: See HPI:  Positive for claudication GI: Positive for GERD; No nausea, vomiting, diarrhea, or abdominal pain GU: Negative; No dysuria, hematuria, or difficulty voiding Musculoskeletal: Negative; no myalgias, joint pain, or weakness Hematologic: Negative; no easy bruising, bleeding Endocrine: Negative; no  heat/cold intolerance; no diabetes, Neuro: Negative; no changes in balance, headaches; mild dementia Skin: Negative; No rashes or skin lesions Psychiatric: Negative; No behavioral problems, depression Sleep: Positive for daytime sleepiness, hypersomnolence, no bruxism, restless legs, hypnogognic hallucinations. Other comprehensive 14 point system review is negative   Physical Exam BP (!) 187/79   Pulse 61   Ht 5' (1.524 m)   Wt 123 lb (55.8 kg)   BMI 24.02 kg/m    Repeat blood pressure by me was 160/82  Wt Readings from Last 3 Encounters:  02/21/18 123 lb (55.8 kg)  01/10/18 117 lb 4.6 oz (53.2 kg)  09/02/17 127 lb (57.6 kg)   General: Alert, oriented, no distress.  Skin: normal turgor, no rashes, warm and dry HEENT: Normocephalic, atraumatic. Pupils equal round and reactive to light; sclera anicteric; extraocular muscles intact;  Nose without nasal septal hypertrophy Mouth/Parynx benign; Mallinpatti scale 3 Neck: No JVD, no carotid bruits; normal carotid upstroke.  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 Back: no CVA tenderness Pulses 2+ Musculoskeletal: full range of motion, normal strength, no joint deformities Extremities: no clubbing cyanosis or edema, Homan's sign negative  Neurologic: grossly nonfocal; Cranial nerves grossly wnl Psychologic: Normal mood and affect   ECG (independently read by me): Sinus rhythm at 61 with first-degree AV block with a PR interval of 240 msec.  May 2018 ECG (independently read by me): Sinus bradycardia 52 bpm.  First degree AV block with a PR interval at  248 ms.  Nonspecific ST-T changes  November 2017 ECG (independently read by me), with rhythm strip: Sinus bradycardia 57 bpm.  First-degree AV block with a PR interval at 256 ms.  Rare PVC with VA conduction.  April 2016 ECG (independently read by me): Sinus bradycardia 55 bpm.  First-degree AV block with a PR interval at 246 ms.  Nonspecific ST-T changes.  November 2015 ECG (independently read by me): sinus bradycardia at 59 bpm with first-degree AV block.  LVH with repolarization changes.  ECG (independently read by me) normal sinus rhythm at 66 beats per minute.  First degree AV block with a PR interval at 236 ms.  June 2015 ECG (independently read by me): Sinus bradycardia 59 beats per minute.  First degree AV block with PR interval 240 ms.  Nonspecific ST-T changes.  LABS: BMP Latest Ref Rng & Units 02/21/2018 01/10/2018 01/08/2018  Glucose 65 - 99 mg/dL 91 105(H) 113(H)  BUN 10 - 36 mg/dL _0 Creatinine 0.57 - 1.00 mg/dL 1.05(H) 0.84 1.13(H)  BUN/Creat Ratio 12 - 28 11(L) - -  Sodium 134 - 144 mmol/L 145(H) 140 137  Potassium 3.5 - 5.2 mmol/L 5.3(H) 3.6 4.1  Chloride 96 - 106 mmol/L 105 108 102  CO2 20 -  29 mmol/L _0 Calcium 8.7 - 10.3 mg/dL 11.2(H) 9.4 9.4   Hepatic Function Latest Ref Rng & Units 02/21/2018 04/05/2017 10/14/2016  Total Protein 6.0 - 8.5 g/dL 7.5 6.9 6.9  Albumin 3.2 - 4.6 g/dL 4.6 4.4 4.3  AST 0 - 40 IU/L _1 ALT 0 - 32 IU/L _2 Alk Phosphatase 39 - 117 IU/L 53 43 35  Total Bilirubin 0.0 - 1.2 mg/dL 0.9 0.8 1.0   CBC Latest Ref Rng & Units 01/10/2018 01/08/2018 01/07/2018  WBC 4.0 - 10.5 K/uL - 9.1 10.7(H)  Hemoglobin 12.0 - 15.0 g/dL 14.3 14.7 16.4(H)  Hematocrit 36.0 - 46.0 % 43.3 45.6 49.6(H)  Platelets 150 - 400 K/uL - 160 147(L)   Lab Results  Component Value Date   TSH 2.280 02/21/2018   Lipid Panel     Component Value Date/Time   CHOL 156 01/08/2018 0639   TRIG 75 01/08/2018 0639   HDL 70 01/08/2018 0639   CHOLHDL 2.2  01/08/2018 0639   VLDL 15 01/08/2018 0639   LDLCALC 71 01/08/2018 0639     RADIOLOGY: Mm Digital Screening Bilateral  04/25/2014   CLINICAL DATA:  Screening.  EXAM: DIGITAL SCREENING BILATERAL MAMMOGRAM WITH CAD  COMPARISON:  02/17/2012, 09/22/2010.  ACR Breast Density Category b: There are scattered areas of fibroglandular density.  FINDINGS: In the right breast, skin thickening warrants further evaluation with physical examination and possibly ultrasound. In the left breast, no findings suspicious for malignancy. Images were processed with CAD.  IMPRESSION: Further evaluation is suggested for possible skin thickening in the right breast.  RECOMMENDATION: Physical examination and possibly ultrasound of the right breast. (Code:FI-R-98M)  The patient will be contacted regarding the findings, and additional imaging will be scheduled.  BI-RADS CATEGORY  0: Incomplete. Need additional imaging evaluation and/or prior mammograms for comparison.   Electronically Signed   By: Luberta Robertson M.D.   On: 04/25/2014 07:41   US Breast Ltd Uni Right Inc Axilla  05/08/2014   CLINICAL DATA:  Screening callback for right breast skin thickening asymmetrically in the inferior breast. The patient denies a history of congestive heart failure. She had right shoulder surgery approximately 1 year ago but denies right arm asymmetric edema.  EXAM: ULTRASOUND OF THE right BREAST  COMPARISON:  Prior mammograms  FINDINGS: On physical exam, I palpate no abnormality in the left breast 6 o'clock/ inferior breast. There is no skin thickening, induration, or erythema.  Ultrasound is performed, showing minimal skin thickening in the right inferior breast measuring 3 mm as compared to the right breast 12 o'clock location measuring 2 mm in maximal thickness. No underlying intramammary abnormality is identified.  IMPRESSION: Minimal asymmetric prominence of the right inferior breast skin without underlying intramammary abnormality. There is  no evidence for active infection or other dermatologic abnormality. This is of unclear clinical significance and clinical followup is recommended as part of the patient's routine healthcare maintenance.  RECOMMENDATION: Screening mammogram in one year.(Code:SM-B-01Y)  I have discussed the findings and recommendations with the patient. Results were also provided in writing at the conclusion of the visit. If applicable, a reminder letter will be sent to the patient regarding the next appointment.  BI-RADS CATEGORY  1: Negative.   Electronically Signed   By: Conchita Paris M.D.   On: 05/08/2014 15:34   IMPRESSION:  1. CHB (complete heart block) (HCC)   2. Stokes-Adams syncope   3. Essential hypertension   4. Excessive  daytime sleepiness   5. Pure hypercholesterolemia   6. History of NSVT (nonsustained ventricular tachycardia) (HCC)      ASSESSMENT AND PLAN: Ms.Dalgleish is a 84 year old female with a history of hypertension, moderate concentric left ventricular hypertrophy with LVOT gradient at the midcavity level of 10 mm at her previous echo evaluation in 2013 which was not detected on her subsequent echo evaluation.  She has severely calcified mitral annular calcification and mild pulmonary hypertension.  She has previously had a history of nonsustained ventricular tachycardia which had responded to increasing beta blocker therapy.  When I saw her in 2018, he was on beta-blocker therapy was reduced due to fatigability.  I reviewed her most recent hospitalization after she developed a syncopal spell and interrogation of her loop recorder showed a period of sinus arrest/asystole for 30 seconds.  She underwent successful implantation of a pacemaker.  She has not had any recurrent symptoms of presyncope or syncope.  Her blood pressure today is elevated and initially was 187/79.  On repeat by me this was improved but still elevated at 160/82.  Since she has a pacemaker, I have suggested resumption initially of  low-dose metoprolol succinate and will start at 25 mg for the first week.  If her blood pressure remains greater than 140 she will titrate this to 50 mg daily.  Remotely, she had been on a much higher dose of metoprolol tartrate.  Her amlodipine dose was just recently increased to 10 mg 4 days previously when I consistent blood pressure recordings at home her blood pressure was elevated.  He continues to be on Zetia and Livalo for hyperlipidemia.  She is not having any change in claudication symptoms.  She is now scheduled to undergo a home sleep study for evaluation of obstructive sleep apnea.  I will see her in 3 months for reevaluation.  Time spent: 25 minutes Troy Sine, MD, [facility]  02/23/2018 7:07 PM

## 2018-02-21 NOTE — Patient Instructions (Signed)
Medication Instructions:  START metoprolol succinate (Toprol XL) 50 mg daily  Follow-Up: 7/22 at 4:20 pm with Dr. Claiborne Billings  Any Other Special Instructions Will Be Listed Below (If Applicable).     If you need a refill on your cardiac medications before your next appointment, please call your pharmacy.

## 2018-02-22 LAB — COMPREHENSIVE METABOLIC PANEL
ALT: 14 IU/L (ref 0–32)
AST: 21 IU/L (ref 0–40)
Albumin/Globulin Ratio: 1.6 (ref 1.2–2.2)
Albumin: 4.6 g/dL (ref 3.2–4.6)
Alkaline Phosphatase: 53 IU/L (ref 39–117)
BUN/Creatinine Ratio: 11 — ABNORMAL LOW (ref 12–28)
BUN: 12 mg/dL (ref 10–36)
Bilirubin Total: 0.9 mg/dL (ref 0.0–1.2)
CO2: 25 mmol/L (ref 20–29)
Calcium: 11.2 mg/dL — ABNORMAL HIGH (ref 8.7–10.3)
Chloride: 105 mmol/L (ref 96–106)
Creatinine, Ser: 1.05 mg/dL — ABNORMAL HIGH (ref 0.57–1.00)
GFR calc Af Amer: 54 mL/min/{1.73_m2} — ABNORMAL LOW (ref >59)
GFR calc non Af Amer: 47 mL/min/{1.73_m2} — ABNORMAL LOW (ref >59)
Globulin, Total: 2.9 g/dL (ref 1.5–4.5)
Glucose: 91 mg/dL (ref 65–99)
Potassium: 5.3 mmol/L — ABNORMAL HIGH (ref 3.5–5.2)
Sodium: 145 mmol/L — ABNORMAL HIGH (ref 134–144)
Total Protein: 7.5 g/dL (ref 6.0–8.5)

## 2018-02-22 LAB — TSH: TSH: 2.28 u[IU]/mL (ref 0.450–4.500)

## 2018-02-23 ENCOUNTER — Encounter: Payer: Self-pay | Admitting: Cardiovascular Disease

## 2018-02-24 DIAGNOSIS — D119 Benign neoplasm of major salivary gland, unspecified: Secondary | ICD-10-CM | POA: Diagnosis not present

## 2018-02-24 DIAGNOSIS — Z48812 Encounter for surgical aftercare following surgery on the circulatory system: Secondary | ICD-10-CM | POA: Diagnosis not present

## 2018-02-24 DIAGNOSIS — M1990 Unspecified osteoarthritis, unspecified site: Secondary | ICD-10-CM | POA: Diagnosis not present

## 2018-02-24 DIAGNOSIS — M810 Age-related osteoporosis without current pathological fracture: Secondary | ICD-10-CM | POA: Diagnosis not present

## 2018-02-24 DIAGNOSIS — N183 Chronic kidney disease, stage 3 (moderate): Secondary | ICD-10-CM | POA: Diagnosis not present

## 2018-02-24 DIAGNOSIS — E785 Hyperlipidemia, unspecified: Secondary | ICD-10-CM | POA: Diagnosis not present

## 2018-02-24 DIAGNOSIS — K219 Gastro-esophageal reflux disease without esophagitis: Secondary | ICD-10-CM | POA: Diagnosis not present

## 2018-02-24 DIAGNOSIS — Z7982 Long term (current) use of aspirin: Secondary | ICD-10-CM | POA: Diagnosis not present

## 2018-02-24 DIAGNOSIS — I129 Hypertensive chronic kidney disease with stage 1 through stage 4 chronic kidney disease, or unspecified chronic kidney disease: Secondary | ICD-10-CM | POA: Diagnosis not present

## 2018-02-24 DIAGNOSIS — Z7902 Long term (current) use of antithrombotics/antiplatelets: Secondary | ICD-10-CM | POA: Diagnosis not present

## 2018-02-24 DIAGNOSIS — I739 Peripheral vascular disease, unspecified: Secondary | ICD-10-CM | POA: Diagnosis not present

## 2018-02-27 NOTE — Addendum Note (Signed)
Addended by: Zebedee Iba on: 02/27/2018 08:17 AM   Modules accepted: Orders

## 2018-03-01 ENCOUNTER — Other Ambulatory Visit: Payer: Self-pay | Admitting: *Deleted

## 2018-03-01 DIAGNOSIS — N289 Disorder of kidney and ureter, unspecified: Secondary | ICD-10-CM

## 2018-03-04 ENCOUNTER — Other Ambulatory Visit: Payer: Self-pay | Admitting: Cardiovascular Disease

## 2018-03-07 ENCOUNTER — Other Ambulatory Visit: Payer: Self-pay | Admitting: Cardiovascular Disease

## 2018-03-07 NOTE — Telephone Encounter (Signed)
REFILL 

## 2018-03-09 ENCOUNTER — Telehealth: Payer: Self-pay | Admitting: Cardiovascular Disease

## 2018-03-09 DIAGNOSIS — N289 Disorder of kidney and ureter, unspecified: Secondary | ICD-10-CM | POA: Diagnosis not present

## 2018-03-09 NOTE — Telephone Encounter (Signed)
New message   Devin from [facility] calling to request order for OT once a week for 1 week. Phone [phone]

## 2018-03-09 NOTE — Telephone Encounter (Signed)
Spoke with Devin from [facility] who states they are needing authorization paperwork faxed back. Informed that Dr. Claiborne Billings will be out of office until Tuesday and will have the requested information faxed over that day.

## 2018-03-10 ENCOUNTER — Ambulatory Visit (HOSPITAL_BASED_OUTPATIENT_CLINIC_OR_DEPARTMENT_OTHER): Payer: Medicare Other

## 2018-03-10 LAB — BASIC METABOLIC PANEL
BUN/Creatinine Ratio: 13 (ref 12–28)
BUN: 13 mg/dL (ref 10–36)
CO2: 22 mmol/L (ref 20–29)
Calcium: 10.9 mg/dL — ABNORMAL HIGH (ref 8.7–10.3)
Chloride: 107 mmol/L — ABNORMAL HIGH (ref 96–106)
Creatinine, Ser: 1.04 mg/dL — ABNORMAL HIGH (ref 0.57–1.00)
GFR calc Af Amer: 55 mL/min/{1.73_m2} — ABNORMAL LOW (ref >59)
GFR calc non Af Amer: 47 mL/min/{1.73_m2} — ABNORMAL LOW (ref >59)
Glucose: 92 mg/dL (ref 65–99)
Potassium: 4.7 mmol/L (ref 3.5–5.2)
Sodium: 148 mmol/L — ABNORMAL HIGH (ref 134–144)

## 2018-03-10 LAB — PTH, INTACT AND CALCIUM
Calcium: 10.6 mg/dL — ABNORMAL HIGH (ref 8.7–10.3)
PTH: 33 pg/mL (ref 15–65)

## 2018-03-10 LAB — PHOSPHORUS: Phosphorus: 3.6 mg/dL (ref 2.5–4.5)

## 2018-03-11 LAB — SPECIMEN STATUS REPORT

## 2018-03-11 LAB — VITAMIN D 25 HYDROXY (VIT D DEFICIENCY, FRACTURES): Vit D, 25-Hydroxy: 28.6 ng/mL — ABNORMAL LOW (ref 30.0–100.0)

## 2018-03-16 DIAGNOSIS — Z48812 Encounter for surgical aftercare following surgery on the circulatory system: Secondary | ICD-10-CM | POA: Diagnosis not present

## 2018-03-16 DIAGNOSIS — I129 Hypertensive chronic kidney disease with stage 1 through stage 4 chronic kidney disease, or unspecified chronic kidney disease: Secondary | ICD-10-CM | POA: Diagnosis not present

## 2018-03-16 DIAGNOSIS — I739 Peripheral vascular disease, unspecified: Secondary | ICD-10-CM | POA: Diagnosis not present

## 2018-03-16 DIAGNOSIS — M1990 Unspecified osteoarthritis, unspecified site: Secondary | ICD-10-CM | POA: Diagnosis not present

## 2018-03-16 DIAGNOSIS — E785 Hyperlipidemia, unspecified: Secondary | ICD-10-CM | POA: Diagnosis not present

## 2018-03-16 DIAGNOSIS — Z7902 Long term (current) use of antithrombotics/antiplatelets: Secondary | ICD-10-CM | POA: Diagnosis not present

## 2018-03-16 DIAGNOSIS — N183 Chronic kidney disease, stage 3 (moderate): Secondary | ICD-10-CM | POA: Diagnosis not present

## 2018-03-16 DIAGNOSIS — M810 Age-related osteoporosis without current pathological fracture: Secondary | ICD-10-CM | POA: Diagnosis not present

## 2018-03-16 DIAGNOSIS — Z7982 Long term (current) use of aspirin: Secondary | ICD-10-CM | POA: Diagnosis not present

## 2018-03-16 DIAGNOSIS — D119 Benign neoplasm of major salivary gland, unspecified: Secondary | ICD-10-CM | POA: Diagnosis not present

## 2018-03-16 DIAGNOSIS — K219 Gastro-esophageal reflux disease without esophagitis: Secondary | ICD-10-CM | POA: Diagnosis not present

## 2018-03-29 ENCOUNTER — Emergency Department (HOSPITAL_COMMUNITY)
Admission: EM | Admit: 2018-03-29 | Discharge: 2018-03-29 | Disposition: A | Discharge status: Home / Self Care | Payer: Medicare Other | Attending: Emergency Medicine | Admitting: Emergency Medicine

## 2018-03-29 ENCOUNTER — Emergency Department (HOSPITAL_COMMUNITY): Payer: Medicare Other

## 2018-03-29 ENCOUNTER — Other Ambulatory Visit: Payer: Self-pay

## 2018-03-29 ENCOUNTER — Encounter (HOSPITAL_COMMUNITY): Payer: Self-pay | Admitting: *Deleted

## 2018-03-29 DIAGNOSIS — Z7982 Long term (current) use of aspirin: Secondary | ICD-10-CM | POA: Insufficient documentation

## 2018-03-29 DIAGNOSIS — Z950 Presence of cardiac pacemaker: Secondary | ICD-10-CM

## 2018-03-29 DIAGNOSIS — Z87891 Personal history of nicotine dependence: Secondary | ICD-10-CM | POA: Insufficient documentation

## 2018-03-29 DIAGNOSIS — I129 Hypertensive chronic kidney disease with stage 1 through stage 4 chronic kidney disease, or unspecified chronic kidney disease: Secondary | ICD-10-CM | POA: Diagnosis not present

## 2018-03-29 DIAGNOSIS — R55 Syncope and collapse: Secondary | ICD-10-CM

## 2018-03-29 DIAGNOSIS — I10 Essential (primary) hypertension: Secondary | ICD-10-CM

## 2018-03-29 DIAGNOSIS — R404 Transient alteration of awareness: Secondary | ICD-10-CM | POA: Diagnosis not present

## 2018-03-29 DIAGNOSIS — F0390 Unspecified dementia without behavioral disturbance: Secondary | ICD-10-CM | POA: Diagnosis not present

## 2018-03-29 DIAGNOSIS — Z79899 Other long term (current) drug therapy: Secondary | ICD-10-CM | POA: Insufficient documentation

## 2018-03-29 DIAGNOSIS — N183 Chronic kidney disease, stage 3 (moderate): Secondary | ICD-10-CM | POA: Insufficient documentation

## 2018-03-29 DIAGNOSIS — Z7902 Long term (current) use of antithrombotics/antiplatelets: Secondary | ICD-10-CM | POA: Insufficient documentation

## 2018-03-29 DIAGNOSIS — Z96611 Presence of right artificial shoulder joint: Secondary | ICD-10-CM | POA: Diagnosis not present

## 2018-03-29 DIAGNOSIS — R531 Weakness: Secondary | ICD-10-CM | POA: Insufficient documentation

## 2018-03-29 LAB — BASIC METABOLIC PANEL
Anion gap: 8 (ref 5–15)
BUN: 16 mg/dL (ref 6–20)
CO2: 25 mmol/L (ref 22–32)
Calcium: 9.8 mg/dL (ref 8.9–10.3)
Chloride: 107 mmol/L (ref 101–111)
Creatinine, Ser: 1.05 mg/dL — ABNORMAL HIGH (ref 0.44–1.00)
GFR calc Af Amer: 53 mL/min — ABNORMAL LOW (ref >60)
GFR calc non Af Amer: 45 mL/min — ABNORMAL LOW (ref >60)
Glucose, Bld: 144 mg/dL — ABNORMAL HIGH (ref 65–99)
Potassium: 4.4 mmol/L (ref 3.5–5.1)
Sodium: 140 mmol/L (ref 135–145)

## 2018-03-29 LAB — CBC
HCT: 46.4 % — ABNORMAL HIGH (ref 36.0–46.0)
Hemoglobin: 14.9 g/dL (ref 12.0–15.0)
MCH: 30.9 pg (ref 26.0–34.0)
MCHC: 32.1 g/dL (ref 30.0–36.0)
MCV: 96.3 fL (ref 78.0–100.0)
Platelets: 173 10*3/uL (ref 150–400)
RBC: 4.82 MIL/uL (ref 3.87–5.11)
RDW: 12.4 % (ref 11.5–15.5)
WBC: 7.4 10*3/uL (ref 4.0–10.5)

## 2018-03-29 LAB — URINALYSIS, ROUTINE W REFLEX MICROSCOPIC
Bilirubin Urine: NEGATIVE
Glucose, UA: NEGATIVE mg/dL
Hgb urine dipstick: NEGATIVE
Ketones, ur: NEGATIVE mg/dL
Leukocytes, UA: NEGATIVE
Nitrite: NEGATIVE
Protein, ur: NEGATIVE mg/dL
Specific Gravity, Urine: 1.016 (ref 1.005–1.030)
pH: 6 (ref 5.0–8.0)

## 2018-03-29 NOTE — Consult Note (Signed)
Cardiology Consultation:   Patient ID: Julie Bass; [id number]; [birth year]  Admit date: 03/29/2018 Date of Consult: 03/29/2018  Primary Care Provider: Denita Lung, MD Primary Cardiologist: Shelva Majestic, MD ; Ezariah Nace (device) Primary Electrophysiologist:  Cristopher Peru MD   Patient Profile:   Julie Bass is a 84 y.o. female with a hx of intermittent complete AV block, NSVT, PAT who is being seen today for the evaluation of syncope at the request of Dr. Jeanell Sparrow.  History of Present Illness:   Julie Bass standing at the kitchen sink for a longer period of time when she began feeling faint. She told her daughter that "she could not turn around" and was staring into space. She did not have full blown syncope until her family "picked her up". She regained consciousness quickly once she was placed down horizontally. She did not have convulsions, stiffening or loss of sphincter tone. When she came to, she complained of flushing and diaphoresis.  Her intake of food and fluids has been suboptimal, per the family, but she has not had dyspnea, angina, palpitations, edema, nausea, vomiting, diarrhea, bleeding, fever, cough or urinary complaints.  She was restarted on beta blockers (metoprolol succinate 25 mg daily) a month ago (for HTN, NSVT and hyperdynamic LV with mild mid-cavity obstruction and moderate LVH). Beta blockers had been previously used in much higher doses, but were stopped when it became apparent that she had intermittent bradycardia, heart block. A Biotronik pacemaker was implanted about three months ago.   There is suspicion for OSA, but a home sleep study was nondiagnostic due to equipment failure. She has mild PAH, has had angioplasty/stents for PAD and takes statins for hyperlipidemia. She has moderate Alzheimer's versus vascular dementia and is followed by Dr. Leta Baptist.  I performed a device interrogation in the ED. The Biotronik [place] 8 DR-T device is working normally.  Presenting rhythm was A paced, V sensed with prolonged AV delay. Underlying rhythm is mild sinus bradycardia in low 50s. Lead parameters are excellent and stable. Estimated battery longevity (at BOL) is >9 years. 3 arrhythmic episodes have been recorded since February, none after April 3. Two episodes are 10-11 beat runs of NSVT. The third is PAT. They only lasted for 4 seconds each. Nothing was recorded today.  Past Medical History:  Diagnosis Date  . Arthritis   . Chronic kidney disease    KIDNEY STONES  . Dementia    BEGINNING STAGES   . Depression   . GERD (gastroesophageal reflux disease)   . Glaucoma   . Hiatal hernia   . HTN (hypertension) 10/18/2011  . Hyperlipemia 10/18/2011  . NSVT (nonsustained ventricular tachycardia) ([place]) 10/18/2011  . Osteoporosis   . PAD (peripheral artery disease) ([place]) 10/18/2011   h/o left SFA stent    Past Surgical History:  Procedure Laterality Date  . ABDOMINAL HYSTERECTOMY  1973  . CARDIAC CATHETERIZATION     2012  . CHOLECYSTECTOMY    . LOOP RECORDER IMPLANT N/A 08/27/2014   Procedure: LOOP RECORDER IMPLANT;  Surgeon: Coralyn Mark, MD;  Location: [facility];  Service: Cardiovascular;  Laterality: N/A;  . LOOP RECORDER REMOVAL N/A 01/09/2018   Procedure: LOOP RECORDER REMOVAL;  Surgeon: Evans Lance, MD;  Location: [facility];  Service: Cardiovascular;  Laterality: N/A;  . LOWER EXTREMITY ANGIOGRAM  11/02/2007   stent of mid left SFA with 6x173mm EV3 self-expanding stent and 6x3 in prox region (Dr. Adora Fridge)  . LOWER EXTREMITY ANGIOGRAM    .  LOWER EXTREMITY ARTERIAL DOPPLER  2013   right SFA with 50-69% diameter reduction, right PTA/peroneal occluded, L SFA prox to stent has narrowing with increased velocities >60% diameter reduction, L SFA stent patent, L ATA w/occlusive disease, bilat ABIs show mild arterial insuffiency at rest  . NM MYOCAR PERF WALL MOTION  10/2011   non-gated - normal stuy, EF 82%, normal LV wall motion  .  PACEMAKER IMPLANT N/A 01/09/2018   Procedure: PACEMAKER IMPLANT;  Surgeon: Evans Lance, MD;  Location: [facility];  Service: Cardiovascular;  Laterality: N/A;  . REVERSE SHOULDER ARTHROPLASTY Right 01/26/2013   Procedure: RIGHT REVERSE TOTAL SHOULDER ARTHROPLASTY;  Surgeon: Augustin Schooling, MD;  Location: [facility];  Service: Orthopedics;  Laterality: Right;  . TRANSTHORACIC ECHOCARDIOGRAM  10/2012   EF 75-70%, mod conc hypertrophy, severely calcified MV annulus, LA mildly dailted, PA peak pressure 69mmHg     Home Medications:  Prior to Admission medications   Medication Sig Start Date End Date Taking? Authorizing Provider  amLODipine (NORVASC) 10 MG tablet Take 1 tablet (10 mg total) by mouth daily. 02/21/18   Troy Sine, MD  aspirin EC 81 MG tablet Take 81 mg by mouth every evening.     [provider]  citalopram (CELEXA) 10 MG tablet TAKE 1 TABLET BY MOUTH EVERY DAY 03/06/18   Troy Sine, MD  clopidogrel (PLAVIX) 75 MG tablet TAKE 1 TABLET BY MOUTH DAILY 04/27/17   Troy Sine, MD  ezetimibe (ZETIA) 10 MG tablet Take 1 tablet (10 mg total) by mouth daily. 03/07/18   Shantice Menger, MD  feeding supplement, ENSURE COMPLETE, (ENSURE COMPLETE) LIQD Take 237 mLs by mouth 3 (three) times daily between meals. 08/27/14   Barrett, Evelene Croon, PA-C  LIVALO 2 MG TABS TAKE 1 TABLET BY MOUTH EVERY DAY 03/06/18   Troy Sine, MD  losartan (COZAAR) 100 MG tablet TAKE 1 TABLET BY MOUTH DAILY 04/26/17   Troy Sine, MD  metoprolol succinate (TOPROL-XL) 50 MG 24 hr tablet Take 1 tablet (50 mg total) by mouth daily. Take with or immediately following a meal. 02/21/18   Troy Sine, MD  Multiple Vitamins-Minerals (SENIOR MULTIVITAMIN PLUS PO) Take 1 tablet by mouth daily.    [provider]  omega-3 acid ethyl esters (LOVAZA) 1 g capsule Take 1 capsule (1 g total) by mouth every evening. 07/07/17   Troy Sine, MD  pantoprazole (PROTONIX) 40 MG tablet Take 1 tablet  (40 mg total) by mouth 2 (two) times daily. Patient taking differently: Take 40 mg by mouth daily as needed (for acid reflux).  02/09/16   Troy Sine, MD  timolol (TIMOPTIC) 0.5 % ophthalmic solution Place 1 drop into both eyes 2 (two) times daily.     [provider]    Inpatient Medications: Scheduled Meds:  Continuous Infusions:  PRN Meds:   Allergies:    Allergies  Allergen Reactions  . Crestor [Rosuvastatin Calcium]     Muscle pain  . Lipitor [Atorvastatin Calcium]     Muscle pain    Social History:   Social History   Socioeconomic History  . Marital status: Widowed    Spouse name: Not on file  . Number of children: 4  . Years of education: Not on file  . Highest education level: Not on file  Occupational History  . Not on file  Social Needs  . Financial resource strain: Not on file  . Food insecurity:  Worry: Not on file    Inability: Not on file  . Transportation needs:    Medical: Not on file    Non-medical: Not on file  Tobacco Use  . Smoking status: Former Smoker    Last attempt to quit: 05/07/1984    Years since quitting: 33.9  . Smokeless tobacco: Never Used  Substance and Sexual Activity  . Alcohol use: No  . Drug use: No  . Sexual activity: Not Currently  Lifestyle  . Physical activity:    Days per week: Not on file    Minutes per session: Not on file  . Stress: Not on file  Relationships  . Social connections:    Talks on phone: Not on file    Gets together: Not on file    Attends religious service: Not on file    Active member of club or organization: Not on file    Attends meetings of clubs or organizations: Not on file    Relationship status: Not on file  . Intimate partner violence:    Fear of current or ex partner: Not on file    Emotionally abused: Not on file    Physically abused: Not on file    Forced sexual activity: Not on file  Other Topics Concern  . Not on file  Social History Narrative  . Not on file      Family History:    Family History  Problem Relation Age of Onset  . Throat cancer Father   . Cancer Mother   . CAD Son   . Breast cancer Sister        cervical cancer  . Colon cancer Brother        throat cancer  . Pulmonary embolism Daughter   . Hypertension Daughter      ROS:  Please see the history of present illness.   All other ROS reviewed and negative.     Physical Exam/Data:   Vitals:   03/29/18 1856 03/29/18 1900  BP: (!) 152/60   Pulse: (!) 58   Resp: 16   Temp: 98.6 F (37 C)   TempSrc: Oral   SpO2: 97%   Weight:  123 lb (55.8 kg)  Height:  5\' 1"  (1.549 m)   No intake or output data in the 24 hours ending 03/29/18 1932 Filed Weights   03/29/18 1900  Weight: 123 lb (55.8 kg)   Body mass index is 23.24 kg/m.  General:  Well nourished, well developed, in no acute distress, appears quieter than usual, but she is alert and interactive HEENT: normal Lymph: no adenopathy Neck: no JVD Endocrine:  No thryomegaly Vascular: No carotid bruits; FA pulses 2+ bilaterally without bruits  Cardiac:  normal S1, loud S2; RRR; no murmur, healthy L subclavian PM site Lungs:  clear to auscultation bilaterally, no wheezing, rhonchi or rales  Abd: soft, nontender, no hepatomegaly  Ext: no edema Musculoskeletal:  No deformities, BUE and BLE strength normal and equal Skin: warm and dry  Neuro:  CNs 2-12 intact, no focal abnormalities noted Psych:  Normal affect   EKG:  The EKG was personally reviewed and demonstrates:  A paced and sinus beats with slightly prolonged native AV conduction, with narrow QRS. No ischemic repol changes Telemetry:  Telemetry was personally reviewed and demonstrates:  A paced V sensed rhythm  Relevant CV Studies: Echo Jan 10, 2018 Study Conclusions  - Left ventricle: The cavity size was normal. There was mild   concentric hypertrophy. Systolic function  was vigorous. The   estimated ejection fraction was in the range of 70% to 75%. There    was dynamic obstruction at restin the mid cavity, with a peak   velocity of 300 cm/sec and a peak gradient of 36 mm Hg. Wall   motion was normal; there were no regional wall motion   abnormalities. Doppler parameters are consistent with abnormal   left ventricular relaxation (grade 1 diastolic dysfunction). - Aortic valve: Valve area (VTI): 2.11 cm^2. Valve area (Vmax):   2.26 cm^2. Valve area (Vmean): 2.32 cm^2. - Mitral valve: Moderately to severely calcified annulus. Valve   area by pressure half-time: 2.47 cm^2. - Left atrium: The atrium was mildly dilated. - Atrial septum: No defect or patent foramen ovale was identified.  Impressions:  - Compared to November 2017, there is no longer evidence of   elevated left heart filling presures.  CAROTID DUPLEX 01/10/2018 Final Interpretation: Right Carotid: Velocities in the right ICA are consistent with a 1-39% stenosis.  Left Carotid: Velocities in the left ICA are consistent with a 1-39% stenosis.  Vertebrals: Both vertebral arteries were patent with antegrade flow.   Laboratory Data:  ChemistryNo results for input(s): NA, K, CL, CO2, GLUCOSE, BUN, CREATININE, CALCIUM, GFRNONAA, GFRAA, ANIONGAP in the last 168 hours.  No results for input(s): PROT, ALBUMIN, AST, ALT, ALKPHOS, BILITOT in the last 168 hours. HematologyNo results for input(s): WBC, RBC, HGB, HCT, MCV, MCH, MCHC, RDW, PLT in the last 168 hours. Cardiac EnzymesNo results for input(s): TROPONINI in the last 168 hours. No results for input(s): TROPIPOC in the last 168 hours.  BNPNo results for input(s): BNP, PROBNP in the last 168 hours.  DDimer No results for input(s): DDIMER in the last 168 hours.  Radiology/Studies:  No results found.  Assessment and Plan:   1. Syncope: sounds strongly suggestive of a vasovagal hypotensive episode (warmth/flushing, sweating, brief episode with recovery after lying down). Retrospectively, one wonders whether her long episode of  asystole/CHB may have had a similar mechanism, rather than age-related conduction system disease. She did not have any arrhythmia and may have had vasodepressor syncope (possibly potentiated by prolonged orthostasis and relative dehydration). She may benefit from some IV fluids, I think she is a little "dry". She does not have a history of seizures. 2. Hypertrophic CMP:  I would recommend continuing her beta blockers. Hyperdynamic LV contraction with mid cavity obliteration could be involved in the mechanism of neurally mediated syncope. 3. HTN:  It is important to reduce vasodilators and avoid diuretics.  4. PPM:  Normal device function. Consider sudden brady response. She has a CLS device that should offer optimal protection from neuraly mediated events.   For questions or updates, please contact [place] Please consult [email or web address] for contact info under Cardiology/STEMI.   Signed, Sanda Klein, MD  03/29/2018 7:32 PM

## 2018-03-29 NOTE — ED Triage Notes (Signed)
EMS reported  From info from family. Pt was standing in the kitchen and had a unresponsive stare. Family took Pt to chair where it was reported PT. Had a syncope  Episode for approx. 2 mins.

## 2018-03-29 NOTE — ED Provider Notes (Signed)
[facility] EMERGENCY DEPARTMENT Provider Note   CSN: [id number] Arrival date & time: 03/29/18  1846     History   Chief Complaint Chief Complaint  Patient presents with  . Loss of Consciousness    HPI RIYANSHI WAHAB is a 84 y.o. female.  HPI  84 year old female history of tach with pacemaker in place presents today with episode of generalized weakness and staring that lasted a couple of minutes.  She did not lose postural tension.  No signs of seizure were noted.  She has had a recent increase of her metoprolol to 50 mg.  She denies any headache, head injury, vision change, chest pain, dyspnea, nausea, vomiting, diarrhea, fever, or chills.  Patient was seen initially here with cardiology.  Her pacemaker was interrogated and there were no signs of arrhythmia.  She did not have chest pain.  Cardiology advises that patient will likely not need troponin.  Past Medical History:  Diagnosis Date  . Arthritis   . Chronic kidney disease    KIDNEY STONES  . Dementia    BEGINNING STAGES   . Depression   . GERD (gastroesophageal reflux disease)   . Glaucoma   . Hiatal hernia   . HTN (hypertension) 10/18/2011  . Hyperlipemia 10/18/2011  . NSVT (nonsustained ventricular tachycardia) ([place]) 10/18/2011  . Osteoporosis   . PAD (peripheral artery disease) ([place]) 10/18/2011   h/o left SFA stent    Patient Active Problem List   Diagnosis Date Noted  . CHB (complete heart block) ([place]) 01/20/2018  . OSA (obstructive sleep apnea) 01/20/2018  . 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 GERD (gastroesophageal reflux disease) 01/08/2018  . Chest pain 01/07/2018  . CKD (chronic kidney disease), stage III ([place]) 01/07/2018  . Bacteremia 06/16/2016  . UTI (lower urinary tract infection) 06/16/2016  . Warthin's tumor 01/01/2016  . Dementia 03/14/2015  . Orthostatic hypotension 08/27/2014  . Physical deconditioning 08/27/2014  . Abdominal pain in female 08/27/2014  . Osteoarthrosis,  unspecified whether generalized or localized, shoulder region 01/26/2013  . Chest wall pain 12/26/2012  . Rib contusion 12/26/2012  . NSVT (nonsustained ventricular tachycardia) ([place]) 10/18/2011  . HTN (hypertension) 10/18/2011  . HLD (hyperlipidemia) 10/18/2011  . PAD (peripheral artery disease) ([place]) 10/18/2011  . Syncope 09/30/2011  . Cough 09/30/2011  . Fatigue 09/30/2011  . Fall 09/30/2011  . Pelvic joint pain 09/30/2011  . Back pain 09/30/2011  . Need for prophylactic vaccination and inoculation against influenza 09/30/2011    Past Surgical History:  Procedure Laterality Date  . ABDOMINAL HYSTERECTOMY  1973  . CARDIAC CATHETERIZATION     2012  . CHOLECYSTECTOMY    . LOOP RECORDER IMPLANT N/A 08/27/2014   Procedure: LOOP RECORDER IMPLANT;  Surgeon: Coralyn Mark, MD;  Location: [facility];  Service: Cardiovascular;  Laterality: N/A;  . LOOP RECORDER REMOVAL N/A 01/09/2018   Procedure: LOOP RECORDER REMOVAL;  Surgeon: Evans Lance, MD;  Location: [facility];  Service: Cardiovascular;  Laterality: N/A;  . LOWER EXTREMITY ANGIOGRAM  11/02/2007   stent of mid left SFA with 6x116mm EV3 self-expanding stent and 6x3 in prox region (Dr. Adora Fridge)  . LOWER EXTREMITY ANGIOGRAM    . LOWER EXTREMITY ARTERIAL DOPPLER  2013   right SFA with 50-69% diameter reduction, right PTA/peroneal occluded, L SFA prox to stent has narrowing with increased velocities >60% diameter reduction, L SFA stent patent, L ATA w/occlusive disease, bilat ABIs show mild arterial insuffiency at rest  . NM  MYOCAR PERF WALL MOTION  10/2011   non-gated - normal stuy, EF 82%, normal LV wall motion  . PACEMAKER IMPLANT N/A 01/09/2018   Procedure: PACEMAKER IMPLANT;  Surgeon: Evans Lance, MD;  Location: [facility];  Service: Cardiovascular;  Laterality: N/A;  . REVERSE SHOULDER ARTHROPLASTY Right 01/26/2013   Procedure: RIGHT REVERSE TOTAL SHOULDER ARTHROPLASTY;  Surgeon: Augustin Schooling, MD;   Location: [facility];  Service: Orthopedics;  Laterality: Right;  . TRANSTHORACIC ECHOCARDIOGRAM  10/2012   EF 75-70%, mod conc hypertrophy, severely calcified MV annulus, LA mildly dailted, PA peak pressure 12mmHg     OB History   None      Home Medications    Prior to Admission medications   Medication Sig Start Date End Date Taking? Authorizing Provider  amLODipine (NORVASC) 10 MG tablet Take 1 tablet (10 mg total) by mouth daily. 02/21/18   Troy Sine, MD  aspirin EC 81 MG tablet Take 81 mg by mouth every evening.     [provider]  citalopram (CELEXA) 10 MG tablet TAKE 1 TABLET BY MOUTH EVERY DAY 03/06/18   Troy Sine, MD  clopidogrel (PLAVIX) 75 MG tablet TAKE 1 TABLET BY MOUTH DAILY 04/27/17   Troy Sine, MD  ezetimibe (ZETIA) 10 MG tablet Take 1 tablet (10 mg total) by mouth daily. 03/07/18   Croitoru, Mihai, MD  feeding supplement, ENSURE COMPLETE, (ENSURE COMPLETE) LIQD Take 237 mLs by mouth 3 (three) times daily between meals. 08/27/14   Barrett, Evelene Croon, PA-C  LIVALO 2 MG TABS TAKE 1 TABLET BY MOUTH EVERY DAY 03/06/18   Troy Sine, MD  losartan (COZAAR) 100 MG tablet TAKE 1 TABLET BY MOUTH DAILY 04/26/17   Troy Sine, MD  metoprolol succinate (TOPROL-XL) 50 MG 24 hr tablet Take 1 tablet (50 mg total) by mouth daily. Take with or immediately following a meal. 02/21/18   Troy Sine, MD  Multiple Vitamins-Minerals (SENIOR MULTIVITAMIN PLUS PO) Take 1 tablet by mouth daily.    [provider]  omega-3 acid ethyl esters (LOVAZA) 1 g capsule Take 1 capsule (1 g total) by mouth every evening. 07/07/17   Troy Sine, MD  pantoprazole (PROTONIX) 40 MG tablet Take 1 tablet (40 mg total) by mouth 2 (two) times daily. Patient taking differently: Take 40 mg by mouth daily as needed (for acid reflux).  02/09/16   Troy Sine, MD  timolol (TIMOPTIC) 0.5 % ophthalmic solution Place 1 drop into both eyes 2 (two) times daily.     [provider]    Family History Family History  Problem Relation Age of Onset  . Throat cancer Father   . Cancer Mother   . CAD Son   . Breast cancer Sister        cervical cancer  . Colon cancer Brother        throat cancer  . Pulmonary embolism Daughter   . Hypertension Daughter     Social History Social History   Tobacco Use  . Smoking status: Former Smoker    Last attempt to quit: 05/07/1984    Years since quitting: 33.9  . Smokeless tobacco: Never Used  Substance Use Topics  . Alcohol use: No  . Drug use: No     Allergies   Crestor [rosuvastatin calcium] and Lipitor [atorvastatin calcium]   Review of Systems Review of Systems  All other systems reviewed and are negative.    Physical Exam  Updated Vital Signs BP (!) 152/60   Pulse (!) 58   Temp 98.6 F (37 C) (Oral)   Resp 16   Ht 1.549 m (5\' 1" )   Wt 55.8 kg (123 lb)   SpO2 97%   BMI 23.24 kg/m   Physical Exam  Constitutional: She is oriented to person, place, and time. She appears well-developed and well-nourished.  HENT:  Head: Normocephalic and atraumatic.  Right Ear: External ear normal.  Left Ear: External ear normal.  Eyes: Pupils are equal, round, and reactive to light. Conjunctivae and EOM are normal.  Neck: Normal range of motion. Neck supple.  Cardiovascular: Normal rate, regular rhythm and normal heart sounds.  Pulmonary/Chest: Effort normal and breath sounds normal.  Abdominal: Soft. Bowel sounds are normal.  Neurological: She is alert and oriented to person, place, and time. She displays normal reflexes. No cranial nerve deficit or sensory deficit. She exhibits normal muscle tone. Coordination normal.  Skin: Skin is warm and dry. Capillary refill takes less than 2 seconds.  Nursing note and vitals reviewed.    ED Treatments / Results  Labs (all labs ordered are listed, but only abnormal results are displayed) Labs Reviewed  URINALYSIS, ROUTINE W REFLEX MICROSCOPIC - Abnormal; Notable for  the following components:      Result Value   APPearance HAZY (*)    All other components within normal limits  CBC - Abnormal; Notable for the following components:   HCT 46.4 (*)    All other components within normal limits  BASIC METABOLIC PANEL - Abnormal; Notable for the following components:   Glucose, Bld 144 (*)    Creatinine, Ser 1.05 (*)    GFR calc non Af Amer 45 (*)    GFR calc Af Amer 53 (*)    All other components within normal limits    EKG EKG Interpretation  Date/Time:  Wednesday Mar 29 2018 19:50:09 EDT Ventricular Rate:  64 PR Interval:    QRS Duration: 89 QT Interval:  430 QTC Calculation: 444 R Axis:   9 Text Interpretation:  Normal sinus rhythm Left anterior fasicular block No significant change since last tracing Confirmed by Pattricia Boss [phone]) on 03/29/2018 8:00:12 PM   Radiology No results found.  Procedures Procedures (including critical care time)  Medications Ordered in ED Medications - No data to display   Initial Impression / Assessment and Plan / ED Course  I have reviewed the triage vital signs and the nursing notes.  Pertinent labs & imaging results that were available during my care of the patient were reviewed by me and considered in my medical decision making (see chart for details).     84 year old female who had an episode of weakness this evening.  She was standing and was able to remain standing through this.  She denies any associated chest pain, nausea, vomiting, or fever.  No neuro deficits noted by patient, family, or on exam.  Patient seen here by cardiology and cleared by cardiology.  She has recently started a higher dose of metoprolol.  Plan recheck with cardiology in the next 1 to 2 days.  Discussed return precautions need for follow-up patient voices understanding.  Final Clinical Impressions(s) / ED Diagnoses   Final diagnoses:  None    ED Discharge Orders    None       Pattricia Boss, MD 03/29/18 2200

## 2018-04-03 ENCOUNTER — Encounter: Payer: Self-pay | Admitting: Cardiovascular Disease

## 2018-04-03 ENCOUNTER — Ambulatory Visit (INDEPENDENT_AMBULATORY_CARE_PROVIDER_SITE_OTHER): Payer: Medicare Other | Admitting: Cardiovascular Disease

## 2018-04-03 VITALS — BP 142/86 | HR 61 | Ht 60.0 in | Wt 122.4 lb

## 2018-04-03 DIAGNOSIS — I4729 Other ventricular tachycardia: Secondary | ICD-10-CM

## 2018-04-03 DIAGNOSIS — G4733 Obstructive sleep apnea (adult) (pediatric): Secondary | ICD-10-CM

## 2018-04-03 DIAGNOSIS — R55 Syncope and collapse: Secondary | ICD-10-CM | POA: Diagnosis not present

## 2018-04-03 DIAGNOSIS — Z950 Presence of cardiac pacemaker: Secondary | ICD-10-CM

## 2018-04-03 DIAGNOSIS — I442 Atrioventricular block, complete: Secondary | ICD-10-CM | POA: Diagnosis not present

## 2018-04-03 DIAGNOSIS — I472 Ventricular tachycardia: Secondary | ICD-10-CM | POA: Diagnosis not present

## 2018-04-03 DIAGNOSIS — I739 Peripheral vascular disease, unspecified: Secondary | ICD-10-CM

## 2018-04-03 MED ORDER — AMLODIPINE BESYLATE 2.5 MG PO TABS: 2.5000 mg | ORAL_TABLET | Freq: Every day | ORAL | 3 refills | Status: DC

## 2018-04-03 MED ORDER — METOPROLOL SUCCINATE ER 100 MG PO TB24: 100.0000 mg | ORAL_TABLET | Freq: Every day | ORAL | 3 refills | Status: DC

## 2018-04-03 NOTE — Progress Notes (Signed)
Cardiology Office Note:    Date:  04/03/2018   ID:  Julie Bass, DOB 1926/03/17, MRN [id number]  PCP:  Denita Lung, MD  Cardiologist:  Shelva Majestic, MD   Referring MD: Denita Lung, MD   Chief Complaint  Patient presents with  . Loss of Consciousness    Follow-up from 05/15 ER visit  Pacemaker wound check  History of Present Illness:    Julie Bass is a 84 y.o. female with a hx of syncope related to prolonged paroxysmal complete heart block lasting for 30 seconds, now roughly 10 days status post implantation of a dual-chamber permanent pacemaker Dr. Cristopher Peru.  Due to her advanced age and deconditioning her recovery has been slow and she is living with her daughter [name], who works in our office.   She had another syncopal episode placed last week.  This happened after she been standing at the sink for about an hour, preparing a meal.  Had numerous features suggestive of vasovagal event: Preceded by flushing warmth, quickly relieved by supine position.  Pacemaker interrogation showed normal device function and the absence of arrhythmia.  Labs were unremarkable.  ECG was unchanged.  She has been known to drink very small amounts of water throughout the day.  She is trying to do better with that.  We cut back on her amlodipine and increased her metoprolol dose. She has not had any further syncopal events since then and denies problems with dyspnea, angina, edema orthopnea, PND, palpitations dizziness or lightheadedness or focal neurological complaints  She has a long-standing history of PVD with previous angioplasty and stents placed in the lower extremity arterial circulation.  She has preserved left ventricular systolic function and no known coronary artery disease.  She has a history of hypertension and a previously implanted loop recorder has documented nonsustained ventricular tachycardia, for which she was taking a low-dose of beta-blocker.  This was stopped when she  presented with bradycardia and has not yet been restarted.  She has treated hyperlipidemia and systemic hypertension.  She has moderate Alzheimer's versus vascular dementia and is followed by Dr. Leta Baptist.  Her device is a Aeronautical engineer 8 DR-T.  Both leads are Biotronik leads.  Device check ER on May 15: Presenting rhythm was A paced, V sensed with prolonged AV delay. Underlying rhythm is mild sinus bradycardia in low 50s. Lead parameters are excellent and stable. Estimated battery longevity (at BOL) is >9 years. 3 arrhythmic episodes have been recorded since February, none after April 3. Two episodes are 10-11 beat runs of NSVT. The third is PAT. They only lasted for 4 seconds each.  Past Medical History:  Diagnosis Date  . Arthritis   . Chronic kidney disease    KIDNEY STONES  . Dementia    BEGINNING STAGES   . Depression   . GERD (gastroesophageal reflux disease)   . Glaucoma   . Hiatal hernia   . HTN (hypertension) 10/18/2011  . Hyperlipemia 10/18/2011  . NSVT (nonsustained ventricular tachycardia) ([place]) 10/18/2011  . Osteoporosis   . PAD (peripheral artery disease) ([place]) 10/18/2011   h/o left SFA stent    Past Surgical History:  Procedure Laterality Date  . ABDOMINAL HYSTERECTOMY  1973  . CARDIAC CATHETERIZATION     2012  . CHOLECYSTECTOMY    . LOOP RECORDER IMPLANT N/A 08/27/2014   Procedure: LOOP RECORDER IMPLANT;  Surgeon: Coralyn Mark, MD;  Location: [facility];  Service: Cardiovascular;  Laterality: N/A;  . LOOP  RECORDER REMOVAL N/A 01/09/2018   Procedure: LOOP RECORDER REMOVAL;  Surgeon: Evans Lance, MD;  Location: [facility];  Service: Cardiovascular;  Laterality: N/A;  . LOWER EXTREMITY ANGIOGRAM  11/02/2007   stent of mid left SFA with 6x168mm EV3 self-expanding stent and 6x3 in prox region (Dr. Adora Fridge)  . LOWER EXTREMITY ANGIOGRAM    . LOWER EXTREMITY ARTERIAL DOPPLER  2013   right SFA with 50-69% diameter reduction, right PTA/peroneal occluded, L  SFA prox to stent has narrowing with increased velocities >60% diameter reduction, L SFA stent patent, L ATA w/occlusive disease, bilat ABIs show mild arterial insuffiency at rest  . NM MYOCAR PERF WALL MOTION  10/2011   non-gated - normal stuy, EF 82%, normal LV wall motion  . PACEMAKER IMPLANT N/A 01/09/2018   Procedure: PACEMAKER IMPLANT;  Surgeon: Evans Lance, MD;  Location: [facility];  Service: Cardiovascular;  Laterality: N/A;  . REVERSE SHOULDER ARTHROPLASTY Right 01/26/2013   Procedure: RIGHT REVERSE TOTAL SHOULDER ARTHROPLASTY;  Surgeon: Augustin Schooling, MD;  Location: [facility];  Service: Orthopedics;  Laterality: Right;  . TRANSTHORACIC ECHOCARDIOGRAM  10/2012   EF 75-70%, mod conc hypertrophy, severely calcified MV annulus, LA mildly dailted, PA peak pressure 34mmHg    Current Medications: Current Meds  Medication Sig  . amLODipine (NORVASC) 2.5 MG tablet Take 1 tablet (2.5 mg total) by mouth daily.  Marland Kitchen aspirin EC 81 MG tablet Take 81 mg by mouth every evening.   . citalopram (CELEXA) 10 MG tablet TAKE 1 TABLET BY MOUTH EVERY DAY  . clopidogrel (PLAVIX) 75 MG tablet TAKE 1 TABLET BY MOUTH DAILY  . ezetimibe (ZETIA) 10 MG tablet Take 1 tablet (10 mg total) by mouth daily.  . feeding supplement, ENSURE COMPLETE, (ENSURE COMPLETE) LIQD Take 237 mLs by mouth 3 (three) times daily between meals.  Marland Kitchen LIVALO 2 MG TABS TAKE 1 TABLET BY MOUTH EVERY DAY  . losartan (COZAAR) 100 MG tablet TAKE 1 TABLET BY MOUTH DAILY  . metoprolol succinate (TOPROL-XL) 100 MG 24 hr tablet Take 1 tablet (100 mg total) by mouth daily. Take with or immediately following a meal.  . Multiple Vitamins-Minerals (SENIOR MULTIVITAMIN PLUS PO) Take 1 tablet by mouth daily.  Marland Kitchen omega-3 acid ethyl esters (LOVAZA) 1 g capsule Take 1 capsule (1 g total) by mouth every evening.  . pantoprazole (PROTONIX) 40 MG tablet Take 1 tablet (40 mg total) by mouth 2 (two) times daily. (Patient taking differently: Take 40 mg by  mouth daily as needed (for acid reflux). )  . timolol (TIMOPTIC) 0.5 % ophthalmic solution Place 1 drop into both eyes 2 (two) times daily.   . [DISCONTINUED] amLODipine (NORVASC) 5 MG tablet Take 5 mg by mouth daily.  . [DISCONTINUED] metoprolol succinate (TOPROL-XL) 50 MG 24 hr tablet Take 1 tablet (50 mg total) by mouth daily. Take with or immediately following a meal.     Allergies:   Crestor [rosuvastatin calcium] and Lipitor [atorvastatin calcium]   Social History   Socioeconomic History  . Marital status: Widowed    Spouse name: Not on file  . Number of children: 4  . Years of education: Not on file  . Highest education level: Not on file  Occupational History  . Not on file  Social Needs  . Financial resource strain: Not on file  . Food insecurity:    Worry: Not on file    Inability: Not on file  . Transportation needs:  Medical: Not on file    Non-medical: Not on file  Tobacco Use  . Smoking status: Former Smoker    Last attempt to quit: 05/07/1984    Years since quitting: 33.9  . Smokeless tobacco: Never Used  Substance and Sexual Activity  . Alcohol use: No  . Drug use: No  . Sexual activity: Not Currently  Lifestyle  . Physical activity:    Days per week: Not on file    Minutes per session: Not on file  . Stress: Not on file  Relationships  . Social connections:    Talks on phone: Not on file    Gets together: Not on file    Attends religious service: Not on file    Active member of club or organization: Not on file    Attends meetings of clubs or organizations: Not on file    Relationship status: Not on file  Other Topics Concern  . Not on file  Social History Narrative  . Not on file     Family History: The patient's family history includes Breast cancer in her sister; CAD in her son; Cancer in her mother; Colon cancer in her brother; Hypertension in her daughter; Pulmonary embolism in her daughter; Throat cancer in her father.  ROS:   Please  see the history of present illness.     All other systems reviewed and are negative.  EKGs/Labs/Other Studies Reviewed:    The following studies were reviewed today: Full recent comprehensive pacemaker check  Relevant CV Studies: Echo Jan 10, 2018 Study Conclusions  - Left ventricle: The cavity size was normal. There was mild concentric hypertrophy. Systolic function was vigorous. The estimated ejection fraction was in the range of 70% to 75%. There was dynamic obstruction at restin the mid cavity, with a peak velocity of 300 cm/sec and a peak gradient of 36 mm Hg. Wall motion was normal; there were no regional wall motion abnormalities. Doppler parameters are consistent with abnormal left ventricular relaxation (grade 1 diastolic dysfunction). - Aortic valve: Valve area (VTI): 2.11 cm^2. Valve area (Vmax): 2.26 cm^2. Valve area (Vmean): 2.32 cm^2. - Mitral valve: Moderately to severely calcified annulus. Valve area by pressure half-time: 2.47 cm^2. - Left atrium: The atrium was mildly dilated. - Atrial septum: No defect or patent foramen ovale was identified.  Impressions:  - Compared to November 2017, there is no longer evidence of elevated left heart filling presures.  CAROTID DUPLEX 01/10/2018 Final Interpretation: Right Carotid: Velocities in the right ICA are consistent with a 1-39% stenosis.  Left Carotid: Velocities in the left ICA are consistent with a 1-39% stenosis.  Vertebrals: Both vertebral arteries were patent with antegrade flow.    EKG:  EKG is not ordered today.  On May 15:  A-paced and sinus beats with slightly prolonged native AV conduction, with narrow QRS. No ischemic repol changes  Recent Labs: 02/21/2018: ALT 14; TSH 2.280 03/29/2018: BUN 16; Creatinine, Ser 1.05; Hemoglobin 14.9; Platelets 173; Potassium 4.4; Sodium 140  Recent Lipid Panel    Component Value Date/Time   CHOL 156 01/08/2018 0639   TRIG 75 01/08/2018 0639     HDL 70 01/08/2018 0639   CHOLHDL 2.2 01/08/2018 0639   VLDL 15 01/08/2018 0639   LDLCALC 71 01/08/2018 0639    Physical Exam:    VS:  BP (!) 142/86   Pulse 61   Ht 5' (1.524 m)   Wt 122 lb 6.4 oz (55.5 kg)   SpO2 99%  BMI 23.90 kg/m     Wt Readings from Last 3 Encounters:  04/03/18 122 lb 6.4 oz (55.5 kg)  03/29/18 123 lb (55.8 kg)  02/21/18 123 lb (55.8 kg)    Well-healed left subclavian pacemaker site  General: Alert, oriented x3, no distress, lean; appears comfortable Head: no evidence of trauma, PERRL, EOMI, no exophtalmos or lid lag, no myxedema, no xanthelasma; normal ears, nose and oropharynx Neck: normal jugular venous pulsations and no hepatojugular reflux; brisk carotid pulses without delay and no carotid bruits Chest: clear to auscultation, no signs of consolidation by percussion or palpation, normal fremitus, symmetrical and full respiratory excursions Cardiovascular: normal position and quality of the apical impulse, regular rhythm, normal first and loud P2 component of the second heart sounds, no murmurs, rubs or gallops Abdomen: no tenderness or distention, no masses by palpation, no abnormal pulsatility or arterial bruits, normal bowel sounds, no hepatosplenomegaly Extremities: no clubbing, cyanosis or edema; 2+ radial, ulnar and brachial pulses bilaterally; 2+ right femoral, posterior tibial and dorsalis pedis pulses; 2+ left femoral, posterior tibial and dorsalis pedis pulses; no subclavian or femoral bruits Neurological: grossly nonfocal Psych: Normal mood and affect   ASSESSMENT:    1. Vasovagal syncope   2. Complete heart block ([place])   3. NSVT (nonsustained ventricular tachycardia) ([place])   4. OSA (obstructive sleep apnea)   5. Pacemaker   6. PAD (peripheral artery disease) ([facility])    PLAN:    In order of problems listed above:  1. Syncope : Retrospectively, I wonder if the prolonged episode of complete heart block recorded by her loop recorder was  actually another manifestation of neurocardiogenic syncope.  I suspect that the recent syncopal event was due to a vasodepressor mechanism.  Cutting back on her vasodilators and gradually replacing them with higher doses of beta-blocker.  Today we will increase her metoprolol to 100 mg once daily and cut back amlodipine to 2.5 mg daily.  Discussed the need to stay well-hydrated and avoid prolonged standing without moving. 2. CHB: She had paroxysmal heart block and has not had meaningful recurrence of ventricular pacing since device implantation.  This reinforces my opinion that her heart block may have been due to a vagal response.  3. NSVT: Very infrequent events and not associated with her syncope.  Cutting back on the beta-blocker did not improve her fatigue.  We will increase the dose of metoprolol 4. OSA: It is possible that she has obstructive sleep apnea which explains her daytime hypersomnolence and may be responsible for her loud P2 as a sign of pulmonary hypertension (although her echo did not show pulmonary hypertension).  There was some technical problem with her home sleep study and this has been rescheduled 5. PPM: Site has healed well.  Normal device function.  Remote download every 3 months and yearly office visits.  6. PAD: Denies claudication, hard to assess due to sedentary lifestyle.  On lipid-lowering therapy.   Medication Adjustments/Labs and Tests Ordered: Current medicines are reviewed at length with the patient today.  Concerns regarding medicines are outlined above.  No orders of the defined types were placed in this encounter.  Meds ordered this encounter  Medications  . amLODipine (NORVASC) 2.5 MG tablet    Sig: Take 1 tablet (2.5 mg total) by mouth daily.    Dispense:  90 tablet    Refill:  3  . metoprolol succinate (TOPROL-XL) 100 MG 24 hr tablet    Sig: Take 1 tablet (100 mg total) by  mouth daily. Take with or immediately following a meal.    Dispense:  90 tablet     Refill:  3    Signed, Sanda Klein, MD  04/03/2018 6:58 PM    [facility]

## 2018-04-03 NOTE — Patient Instructions (Signed)
Dr Sallyanne Kuster has recommended making the following medication changes: 1. DECREASE Amlodipine to 2.5 mg daily 2. INCREASE Metoprolol succinate to 100 mg daily  Remote monitoring is used to monitor your Pacemaker or ICD from home. This monitoring reduces the number of office visits required to check your device to one time per year. It allows [place] to keep an eye on the functioning of your device to ensure it is working properly. You are scheduled for a device check from home on Monday, August 19th, 2019. You may send your transmission at any time that day. If you have a wireless device, the transmission will be sent automatically. After your physician reviews your transmission, you will receive a notification with your next transmission date.  To improve our patient care and to more adequately follow your device, [facility] has decided, as a practice, to start following each patient four times a year with your home monitor. This means that you may experience a remote appointment that is close to an in-office appointment with your physician. Your insurance will apply at the same rate as other remote monitoring transmissions.  Dr Sallyanne Kuster recommends that you schedule a follow-up appointment in 12 months with a pacemaker check. You will receive a reminder letter in the mail two months in advance. If you don't receive a letter, please call our office to schedule the follow-up appointment.  If you need a refill on your cardiac medications before your next appointment, please call your pharmacy.

## 2018-04-07 ENCOUNTER — Ambulatory Visit (HOSPITAL_BASED_OUTPATIENT_CLINIC_OR_DEPARTMENT_OTHER): Payer: Medicare Other | Attending: Cardiovascular Disease | Admitting: Cardiovascular Disease

## 2018-04-07 VITALS — Ht 60.0 in | Wt 120.0 lb

## 2018-04-07 DIAGNOSIS — G4733 Obstructive sleep apnea (adult) (pediatric): Secondary | ICD-10-CM | POA: Diagnosis not present

## 2018-04-10 ENCOUNTER — Other Ambulatory Visit: Payer: Self-pay | Admitting: Cardiovascular Disease

## 2018-04-11 NOTE — Telephone Encounter (Signed)
Rx sent to pharmacy   

## 2018-04-17 ENCOUNTER — Encounter (HOSPITAL_BASED_OUTPATIENT_CLINIC_OR_DEPARTMENT_OTHER): Payer: Self-pay | Admitting: Cardiovascular Disease

## 2018-04-17 NOTE — Procedures (Signed)
    Patient Name: Julie Bass, Julie Bass Date: 04/08/2018 Gender: Female D.O.B: 14-Jan-1926 Age (years): 82 Referring Provider: Dani Gobble Croitoru Height (inches): 60 Interpreting Physician: Shelva Majestic MD, ABSM Weight (lbs): 120 RPSGT: Jacolyn Reedy BMI: 23 MRN: [id number] Neck Size: 14.00  CLINICAL INFORMATION Sleep Study Type: HST  Indication for sleep study: Daytime Fatigue  Epworth Sleepiness Score: 10  SLEEP STUDY TECHNIQUE A multi-channel overnight portable sleep study was performed. The channels recorded were: nasal airflow, thoracic respiratory movement, and oxygen saturation with a pulse oximetry. Snoring was also monitored.  MEDICATIONS     amLODipine (NORVASC) 2.5 MG tablet             aspirin EC 81 MG tablet         citalopram (CELEXA) 10 MG tablet         clopidogrel (PLAVIX) 75 MG tablet         ezetimibe (ZETIA) 10 MG tablet         feeding supplement, ENSURE COMPLETE, (ENSURE COMPLETE) LIQD         LIVALO 2 MG TABS         losartan (COZAAR) 100 MG tablet         metoprolol succinate (TOPROL-XL) 100 MG 24 hr tablet         Multiple Vitamins-Minerals (SENIOR MULTIVITAMIN PLUS PO)         omega-3 acid ethyl esters (LOVAZA) 1 g capsule         pantoprazole (PROTONIX) 40 MG tablet         timolol (TIMOPTIC) 0.5 % ophthalmic solution      Patient self administered medications include: N/A.  SLEEP ARCHITECTURE Patient was studied for 624 minutes. The sleep efficiency was 100.0 % and the patient was supine for 93.3%. The arousal index was 0.0 per hour.  RESPIRATORY PARAMETERS The overall AHI was 51.8 per hour, with a central apnea index of 0.0 per hour.  The oxygen nadir was 87% during sleep.  CARDIAC DATA Mean heart rate during sleep was 60.2 bpm.  IMPRESSIONS - Severe obstructive sleep apnea occurred during this study (AHI 51.8/h); events were worse with supine (AHI 53.6/h) vs non-supine (AHI 27.4/h) sleep. - No significant central sleep apnea  occurred during this study (CAI = 0.0/h). - Mild oxygen desaturation to a nadir of 87%. - Patient snored 2.1% during the sleep.  DIAGNOSIS - Obstructive Sleep Apnea (327.23 [G47.33 ICD-10])  RECOMMENDATIONS - In this patient with significant cardiovascular comorbidities, recommend an in lab CPAP titration study. - Efforts should be made to optimize nasal and oropharyngeal patency. - Positional therapy avoiding supine position during sleep. - Avoid alcohol, sedatives and other CNS depressants that may worsen sleep apnea and disrupt normal sleep architecture. - Sleep hygiene should be reviewed to assess factors that may improve sleep quality. - Weight management and regular exercise should be initiated or continued.   [Electronically signed] 04/17/2018 05:51 PM  Shelva Majestic MD, [facility], [place], American Board of Sleep Medicine   NPI: [id number] [place] PH: [phone]   FX: [phone] [place]

## 2018-04-18 ENCOUNTER — Other Ambulatory Visit: Payer: Self-pay | Admitting: Cardiovascular Disease

## 2018-04-18 DIAGNOSIS — G4733 Obstructive sleep apnea (adult) (pediatric): Secondary | ICD-10-CM

## 2018-04-22 ENCOUNTER — Ambulatory Visit (HOSPITAL_COMMUNITY)
Admission: EM | Admit: 2018-04-22 | Discharge: 2018-04-22 | Disposition: A | Discharge status: Home / Self Care | Payer: Medicare Other | Attending: Internal Medicine | Admitting: Internal Medicine

## 2018-04-22 ENCOUNTER — Encounter (HOSPITAL_COMMUNITY): Payer: Self-pay

## 2018-04-22 DIAGNOSIS — F0390 Unspecified dementia without behavioral disturbance: Secondary | ICD-10-CM

## 2018-04-22 DIAGNOSIS — R51 Headache: Secondary | ICD-10-CM | POA: Diagnosis not present

## 2018-04-22 DIAGNOSIS — W19XXXA Unspecified fall, initial encounter: Secondary | ICD-10-CM | POA: Diagnosis not present

## 2018-04-22 NOTE — ED Provider Notes (Signed)
[place]    CSN: [id number] Arrival date & time: 04/22/18  1631     History   Chief Complaint Chief Complaint  Patient presents with  . Fall    HPI Julie Bass is a 83 y.o. female.   Patient is a 84 y.o. Female, brought in by daughter today for a fall that occurred this afternoon around 3pm. Patient states that she was just doing her house work and just suddenly fell down to the carpet floor. She doesn't remember how she fell. She doesn't know how she landed on the floor. She denies syncopal episode. She denies head injury. Daughter brought patient in because patient was complaining of a headache but headache resolved when she arrived to the urgent care. Patient denies nausea, dizziness, visual disturbances, focal weakness. Patient does have dementia. Is currently on Plavix and aspirin.      Past Medical History:  Diagnosis Date  . Arthritis   . Chronic kidney disease    KIDNEY STONES  . Dementia    BEGINNING STAGES   . Depression   . GERD (gastroesophageal reflux disease)   . Glaucoma   . Hiatal hernia   . HTN (hypertension) 10/18/2011  . Hyperlipemia 10/18/2011  . NSVT (nonsustained ventricular tachycardia) ([place]) 10/18/2011  . Osteoporosis   . PAD (peripheral artery disease) ([place]) 10/18/2011   h/o left SFA stent    Patient Active Problem List   Diagnosis Date Noted  . CHB (complete heart block) ([place]) 01/20/2018  . OSA (obstructive sleep apnea) 01/20/2018  . 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 GERD (gastroesophageal reflux disease) 01/08/2018  . Chest pain 01/07/2018  . CKD (chronic kidney disease), stage III ([place]) 01/07/2018  . Bacteremia 06/16/2016  . UTI (lower urinary tract infection) 06/16/2016  . Warthin's tumor 01/01/2016  . Dementia 03/14/2015  . Orthostatic hypotension 08/27/2014  . Physical deconditioning 08/27/2014  . Abdominal pain in female 08/27/2014  . Osteoarthrosis, unspecified whether generalized or localized, shoulder region  01/26/2013  . Chest wall pain 12/26/2012  . Rib contusion 12/26/2012  . NSVT (nonsustained ventricular tachycardia) ([place]) 10/18/2011  . HTN (hypertension) 10/18/2011  . HLD (hyperlipidemia) 10/18/2011  . PAD (peripheral artery disease) ([place]) 10/18/2011  . Syncope 09/30/2011  . Cough 09/30/2011  . Fatigue 09/30/2011  . Fall 09/30/2011  . Pelvic joint pain 09/30/2011  . Back pain 09/30/2011  . Need for prophylactic vaccination and inoculation against influenza 09/30/2011    Past Surgical History:  Procedure Laterality Date  . ABDOMINAL HYSTERECTOMY  1973  . CARDIAC CATHETERIZATION     2012  . CHOLECYSTECTOMY    . LOOP RECORDER IMPLANT N/A 08/27/2014   Procedure: LOOP RECORDER IMPLANT;  Surgeon: Coralyn Mark, MD;  Location: [facility];  Service: Cardiovascular;  Laterality: N/A;  . LOOP RECORDER REMOVAL N/A 01/09/2018   Procedure: LOOP RECORDER REMOVAL;  Surgeon: Evans Lance, MD;  Location: [facility];  Service: Cardiovascular;  Laterality: N/A;  . LOWER EXTREMITY ANGIOGRAM  11/02/2007   stent of mid left SFA with 6x181mm EV3 self-expanding stent and 6x3 in prox region (Dr. Adora Fridge)  . LOWER EXTREMITY ANGIOGRAM    . LOWER EXTREMITY ARTERIAL DOPPLER  2013   right SFA with 50-69% diameter reduction, right PTA/peroneal occluded, L SFA prox to stent has narrowing with increased velocities >60% diameter reduction, L SFA stent patent, L ATA w/occlusive disease, bilat ABIs show mild arterial insuffiency at rest  . NM MYOCAR PERF WALL MOTION  10/2011  non-gated - normal stuy, EF 82%, normal LV wall motion  . PACEMAKER IMPLANT N/A 01/09/2018   Procedure: PACEMAKER IMPLANT;  Surgeon: Evans Lance, MD;  Location: [facility];  Service: Cardiovascular;  Laterality: N/A;  . REVERSE SHOULDER ARTHROPLASTY Right 01/26/2013   Procedure: RIGHT REVERSE TOTAL SHOULDER ARTHROPLASTY;  Surgeon: Augustin Schooling, MD;  Location: [facility];  Service: Orthopedics;  Laterality: Right;  .  TRANSTHORACIC ECHOCARDIOGRAM  10/2012   EF 75-70%, mod conc hypertrophy, severely calcified MV annulus, LA mildly dailted, PA peak pressure 64mmHg    OB History   None      Home Medications    Prior to Admission medications   Medication Sig Start Date End Date Taking? Authorizing Provider  amLODipine (NORVASC) 2.5 MG tablet Take 1 tablet (2.5 mg total) by mouth daily. 04/03/18   Croitoru, Mihai, MD  aspirin EC 81 MG tablet Take 81 mg by mouth every evening.     [provider]  citalopram (CELEXA) 10 MG tablet TAKE 1 TABLET BY MOUTH EVERY DAY 03/06/18   Troy Sine, MD  clopidogrel (PLAVIX) 75 MG tablet TAKE 1 TABLET BY MOUTH DAILY 04/27/17   Troy Sine, MD  ezetimibe (ZETIA) 10 MG tablet Take 1 tablet (10 mg total) by mouth daily. 03/07/18   Croitoru, Mihai, MD  feeding supplement, ENSURE COMPLETE, (ENSURE COMPLETE) LIQD Take 237 mLs by mouth 3 (three) times daily between meals. 08/27/14   Barrett, Evelene Croon, PA-C  LIVALO 2 MG TABS TAKE 1 TABLET BY MOUTH EVERY DAY 03/06/18   Troy Sine, MD  losartan (COZAAR) 100 MG tablet TAKE 1 TABLET BY MOUTH EVERY DAY 04/11/18   Croitoru, Dani Gobble, MD  metoprolol succinate (TOPROL-XL) 100 MG 24 hr tablet Take 1 tablet (100 mg total) by mouth daily. Take with or immediately following a meal. 04/03/18   Croitoru, Mihai, MD  Multiple Vitamins-Minerals (SENIOR MULTIVITAMIN PLUS PO) Take 1 tablet by mouth daily.    [provider]  omega-3 acid ethyl esters (LOVAZA) 1 g capsule Take 1 capsule (1 g total) by mouth every evening. 07/07/17   Troy Sine, MD  pantoprazole (PROTONIX) 40 MG tablet Take 1 tablet (40 mg total) by mouth 2 (two) times daily. Patient taking differently: Take 40 mg by mouth daily as needed (for acid reflux).  02/09/16   Troy Sine, MD  timolol (TIMOPTIC) 0.5 % ophthalmic solution Place 1 drop into both eyes 2 (two) times daily.     [provider]    Family History Family History  Problem  Relation Age of Onset  . Throat cancer Father   . Cancer Mother   . CAD Son   . Breast cancer Sister        cervical cancer  . Colon cancer Brother        throat cancer  . Pulmonary embolism Daughter   . Hypertension Daughter     Social History Social History   Tobacco Use  . Smoking status: Former Smoker    Last attempt to quit: 05/07/1984    Years since quitting: 33.9  . Smokeless tobacco: Never Used  Substance Use Topics  . Alcohol use: No  . Drug use: No     Allergies   Crestor [rosuvastatin calcium] and Lipitor [atorvastatin calcium]   Review of Systems Review of Systems  Constitutional:       See HPI     Physical Exam Triage Vital Signs ED Triage Vitals [04/22/18 1701]  Enc Vitals Group     BP (!) 170/97     Pulse Rate (!) 59     Resp 18     Temp 98.4 F (36.9 C)     Temp src      SpO2 98 %     Weight      Height      Head Circumference      Peak Flow      Pain Score 0     Pain Loc      Pain Edu?      Excl. in [place]?    No data found.  Updated Vital Signs BP (!) 170/97   Pulse (!) 59   Temp 98.4 F (36.9 C)   Resp 18   SpO2 98%      Physical Exam  Constitutional: She appears well-developed and well-nourished.  HENT:  Head: Normocephalic.  Right Ear: External ear normal.  Left Ear: External ear normal.  Eyes: Pupils are equal, round, and reactive to light. Conjunctivae are normal.  Neck: Normal range of motion. Neck supple.  Cardiovascular: Normal rate, regular rhythm and normal heart sounds.  Pulmonary/Chest: Effort normal and breath sounds normal. She has no wheezes.  Abdominal: Soft. Bowel sounds are normal. There is no tenderness.  Neurological: She is alert. No cranial nerve deficit. She exhibits normal muscle tone. Coordination normal.  Has dementia, stated that she had a heart transplant but instead had a pacemaker per daughter.   Skin: Skin is warm and dry.  Psychiatric: She has a normal mood and affect.  Nursing note and  vitals reviewed.    UC Treatments / Results  Labs (all labs ordered are listed, but only abnormal results are displayed) Labs Reviewed - No data to display  EKG None  Radiology No results found.  Procedures Procedures (including critical care time)  Medications Ordered in UC Medications - No data to display  Initial Impression / Assessment and Plan / UC Course  I have reviewed the triage vital signs and the nursing notes.  Pertinent labs & imaging results that were available during my care of the patient were reviewed by me and considered in my medical decision making (see chart for details).   Final Clinical Impressions(s) / UC Diagnoses   Final diagnoses:  Fall, initial encounter   Due to history of dementia, she is a poor historian. She is not able to describe her fall to me. She had a headache but currently resolved. She is 65 yr. Old. She is on plavix. Although she is currently feeling fine without any symptoms, I recommended her to go to the ER for CT Head to rule out any bleed.   Daughter declined ER visit. Daughter prefers to take patient home and keep a close eye on  Her. Daughter states that she will take patient to ER if she shows any symptoms. We discussed about the risk of delayed care and daughter acknowledged.    Discharge Instructions   None    ED Prescriptions    None     Controlled Substance Prescriptions [place] Controlled Substance Registry consulted? Not Applicable   Barry Dienes, NP 04/22/18 [id number]

## 2018-04-22 NOTE — ED Triage Notes (Signed)
Pt presents with complaints of falling today from standing position. States she just fell in the floor she is not sure what part of her body she landed on. Denies any pain at this time.

## 2018-04-28 DIAGNOSIS — H401133 Primary open-angle glaucoma, bilateral, severe stage: Secondary | ICD-10-CM | POA: Diagnosis not present

## 2018-05-02 NOTE — Progress Notes (Unsigned)
This encounter was created in error - please disregard.  This encounter was created in error - please disregard.

## 2018-05-09 DIAGNOSIS — H903 Sensorineural hearing loss, bilateral: Secondary | ICD-10-CM | POA: Diagnosis not present

## 2018-05-09 DIAGNOSIS — G4733 Obstructive sleep apnea (adult) (pediatric): Secondary | ICD-10-CM | POA: Diagnosis not present

## 2018-05-09 DIAGNOSIS — H9193 Unspecified hearing loss, bilateral: Secondary | ICD-10-CM | POA: Diagnosis not present

## 2018-05-12 ENCOUNTER — Encounter (HOSPITAL_BASED_OUTPATIENT_CLINIC_OR_DEPARTMENT_OTHER): Payer: Medicare Other

## 2018-05-19 DIAGNOSIS — G4733 Obstructive sleep apnea (adult) (pediatric): Secondary | ICD-10-CM | POA: Diagnosis not present

## 2018-05-20 ENCOUNTER — Other Ambulatory Visit: Payer: Self-pay | Admitting: Cardiovascular Disease

## 2018-05-22 ENCOUNTER — Other Ambulatory Visit: Payer: Self-pay | Admitting: *Deleted

## 2018-05-22 MED ORDER — EZETIMIBE 10 MG PO TABS: 10.0000 mg | ORAL_TABLET | Freq: Every day | ORAL | 3 refills | Status: DC

## 2018-05-22 MED ORDER — CLOPIDOGREL BISULFATE 75 MG PO TABS: 75.0000 mg | ORAL_TABLET | Freq: Every day | ORAL | 3 refills | Status: DC

## 2018-05-22 MED ORDER — OMEGA-3-ACID ETHYL ESTERS 1 G PO CAPS: 1.0000 g | ORAL_CAPSULE | Freq: Every evening | ORAL | 3 refills | Status: DC

## 2018-05-22 MED ORDER — PITAVASTATIN CALCIUM 2 MG PO TABS: 1.0000 | ORAL_TABLET | Freq: Every day | ORAL | 3 refills | Status: DC

## 2018-06-05 ENCOUNTER — Encounter: Payer: Self-pay | Admitting: Cardiovascular Disease

## 2018-06-05 ENCOUNTER — Ambulatory Visit (INDEPENDENT_AMBULATORY_CARE_PROVIDER_SITE_OTHER): Payer: Medicare Other | Admitting: Cardiovascular Disease

## 2018-06-05 VITALS — BP 144/68 | HR 62 | Ht 60.0 in | Wt 118.0 lb

## 2018-06-05 DIAGNOSIS — G4733 Obstructive sleep apnea (adult) (pediatric): Secondary | ICD-10-CM | POA: Diagnosis not present

## 2018-06-05 DIAGNOSIS — I10 Essential (primary) hypertension: Secondary | ICD-10-CM

## 2018-06-05 DIAGNOSIS — I442 Atrioventricular block, complete: Secondary | ICD-10-CM

## 2018-06-05 DIAGNOSIS — Z950 Presence of cardiac pacemaker: Secondary | ICD-10-CM

## 2018-06-05 DIAGNOSIS — I739 Peripheral vascular disease, unspecified: Secondary | ICD-10-CM

## 2018-06-05 DIAGNOSIS — E781 Pure hyperglyceridemia: Secondary | ICD-10-CM | POA: Diagnosis not present

## 2018-06-05 NOTE — Progress Notes (Signed)
Patient ID: Julie Bass, female   DOB: 1926-11-07, 84 y.o.   MRN: [id number]    PCP: Dr. Jill Alexanders  HPI: KHRISTEN Bass is a 84 y.o. female who presents to the office today for a 3 month follow-up cardiology and new sleep evaluation.  Julie Bass has a history of hypertension, hyperlipidemia, peripheral vascular disease, as well as episodes of nonsustained ventricular tachycardia, for, which she's been treated with beta blocker therapy.  An echo Doppler study in December 2013 showed hyperdynamic LV function with an ejection fraction of at least 43%, grade 1 diastolic dysfunction, and dynamic obstruction in the mid cavity of the left ventricle with a gradient of 10 mm.  She had severely calcified mitral annulus and mild left atrial dilatation.  She has documented nonsustained ventricular tachycardia, which was picked up on a remote event monitor, and she has tolerated titration of her beta blocker therapy.  She also has had difficulty with statin intolerance for hyperlipidemia, but has tolerated livalo 2 mg daily.  She also has history of hypertension and has been on losartan 100 mg in addition to her metoprolol.  She has peripheral vascular disease and is status post stenting of her left SFA in the past.  She has one vessel runoff bilaterally and has moderate right SFA disease.  Her last LE Doppler study was in August 2013 , which showed occluded right PTA and peroneal occluded left PTA, left ATA with reconstitution of the dorsalis pedis artery.  ABIs were 0.88 on the right and 0.86 on the left.  She has been on aspirin 81 mg, and Plavix 75 mg   A 2 year followup echo Doppler study on 05/15/2014 showed an ejection fraction of 65-70% and there was grade 1 diastolic dysfunction.  There was moderate mitral annular calcification.  There was no mention of any dynamic obstruction.  Lower extremity Doppler studies revealed a patent left SFA stent with prior disease noted in several vessels, not  significantly changed from 2013.  She was hospitalized on 08/23/2014 after developing a syncopal spell.  She was not significantly orthostatic to her hospitalization.  Upon presentation her blood pressure was 170/58.  Her ECG showed sinus rhythm with mild ST changes laterally, and first degree heart block.  She was noted to have a single 15 beat run of nonsustained VT.  She ultimately underwent implantation of a loop recorder on 08/27/2014.  She underwent carotid duplex imaging and findings suggest 1-39% internal carotid artery stenosis bilaterally. The left vertebral artery is patent with antegrade flow but the the right vertebral artery was not able to be visualized.  After discharge, the loop recorder detected 2 episodes of questionable transient asystole.  Upon review of these tracings with Dr. Sallyanne Kuster it appears that these may very well be artifactual.  One episode was recorded on October 14, and another on October 16.  The episode in October 16 tach, rate of less than over 30 seconds.  The patient was with her daughter, who is my nurse at the time.  She denies any symptoms at this time.  Specifically, she denied any lightheadedness, and one would expect that with a >30 second pause she would have been significantly symptomatic and this would have also been noted by her daughter.  She denies any major episodes of dizziness but if she gets up quickly or lies down fast.  A CT of her abdomen showed atherosclerotic plaque throughout her abdominal aorta with suggestion of a tiny 0.5 x 0.2 cm  atherosclerotic ulcer.  She was also noted to have plaque that was irregular throughout the right superficial femoral artery and also plaque in the popliteal artery, not resulting in hemodynamically significance stenosis.  She had single-vessel runoff to the right lower leg and foot.  The right anterior tibial artery with atretic reconstitution of both posterior tibial and peroneal vascular distributions.  The right  dorsalis pedis artery was patent to the level of the forefoot.  She had stents noted in the distal aspect of her left common femoral artery and single vessel runoff to the left lower leg and foot.  There was occlusion of both the left anterior, posterior tibial arteries.  The peroneal artery was found to reconstitute a dorsalis pedis artery at the level of the hindfoot.  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    She has underone neurologic evaluation with Dr. Corwin Levins and is felt to have moderate  dementia felt to be Alzheimer's  versus vascular dementia.  A CT scan of her head revealed mild cortical atrophy and moderately advanced small vessel ischemic changes involving the periventricular white matter with remote infarction in the right frontal lobe.   In October 2017, she was found to have an episode of irregular nonsustained wide complex tachycardia with a maximum rate at 167 bpm and ultimately reverted back to sinus rhythm.  She was completely asymptomatic and this occurred while she was in bed.  At that time, her metoprolol dose, which had been 75 Mill grams twice a day was increased to 100 mg in the morning and 75 mg at night.  She has continued to take losartan 100 mg daily as well as amlodipine 5 mg for blood pressure control.  She has felt well on the increased beta blocker regimen.  She has a history of hyperlipidemia and has been able to tolerate Livalo but could not tolerate other statins.  Her current Livalo dose is  mg daily and she also takes Zetia 10 mg.  She is on gabapentin for neuropathy.  She also is on Celexa.  She underwent an echo Doppler study yesterday, 10/13/2016 which showed an EF of 55-60%.  There was mild LVH.  There was grade 2 diastolic dysfunction with abnormal tissue Doppler suggesting high ventricular filling pressures.  Her aortic valve mobility was restricted but she did not have  stenosis.  There was mitral annular calcification.  There is mild left atrial dilatation.  There was mild increased pulmonary pressure.    She has had issues with short-term memory loss. When  I last saw her in May 2018 she denied any episodes of chest pain or shortness of breath.  She was fatigued and was sleeping a lot.  She denied any episodes of tachycardia, presyncope or syncope.    A loop recorder assessment from 01/15/2017 through 03/15/2017 did not show any episodes of atrial fibrillation, tachycardia, bradycardia, pauses.  Laboratory was done 2 days ago.  TSH 2.33.  Hemoglobin 14.9, hematocrit 45.7.  BUN 18, creatinine 1.24.  LFTs were normal.  On Livalo  cholesterol 186, HDL 79, triglycerides 85, and LDL 90.    She developed an episode of syncope in February 2019 and was found to have complete heart block with a prolonged sinus pause for close to 30 seconds.  She was seen by Dr. Lovena Le during her hospitalization and underwent insertion of a permanent pacemaker [employer] 8 DR-T.  Echo Doppler study  on January 10, 2018 showed an EF of 70 to 75%.  There was dynamic obstruction at the mid cavity level with a peak velocity of 300 cm/s with peak gradient of 36 mm.  There was grade 1 diastolic dysfunction.  There was moderately severe mitral annular calcification.  She was subsequently seen in the office by Dr. Sallyanne Kuster who follows her pacemaker.  Remotely, she had previously experienced episodes of nonsustained VT.  Her beta-blocker had been held prior to insertion of her pacemaker after she presented with her prolonged pause and there was plans to later potentially reinitiate therapy.  There is also been concern in the past of probable obstructive sleep apnea but the patient had still never been tested.  She has seen Dr. Sallyanne Kuster in follow-up of an emergency room evaluation in May when she presented with generalized weakness to the emergency room.  Her pacemaker was interrogated and there was no  signs of arrhythmia.  She did not have any associated chest pain.  She was ultimately referred for a home sleep study due to significant concerns for obstructive sleep apnea.  She was found to have severe obstructive sleep apnea with an AHI 51.8/h.  Events were worse with supine position with an AHI 53.6/h.  Oxygen desaturated to 87%.  Her CPAP set up date was May 19, 2018 with [facility] is her DME company.  An initial download obtained in the office for the last 4 weeks today to the office today which showed very poor compliance with only 5 out of 17 usage days and average usage at 3 hours and 12 minutes.  According to the patient's daughter, the patient is very tired.  Her sleep is nonrestorative.  She snores.  She has no energy.  She presents for evaluation.   Past Medical History:  Diagnosis Date  . Arthritis   . Chronic kidney disease    KIDNEY STONES  . Dementia    BEGINNING STAGES   . Depression   . GERD (gastroesophageal reflux disease)   . Glaucoma   . Hiatal hernia   . HTN (hypertension) 10/18/2011  . Hyperlipemia 10/18/2011  . NSVT (nonsustained ventricular tachycardia) ([place]) 10/18/2011  . Osteoporosis   . PAD (peripheral artery disease) ([place]) 10/18/2011   h/o left SFA stent    Past Surgical History:  Procedure Laterality Date  . ABDOMINAL HYSTERECTOMY  1973  . CARDIAC CATHETERIZATION     2012  . CHOLECYSTECTOMY    . LOOP RECORDER IMPLANT N/A 08/27/2014   Procedure: LOOP RECORDER IMPLANT;  Surgeon: Coralyn Mark, MD;  Location: [facility];  Service: Cardiovascular;  Laterality: N/A;  . LOOP RECORDER REMOVAL N/A 01/09/2018   Procedure: LOOP RECORDER REMOVAL;  Surgeon: Evans Lance, MD;  Location: [facility];  Service: Cardiovascular;  Laterality: N/A;  . LOWER EXTREMITY ANGIOGRAM  11/02/2007   stent of mid left SFA with 6x181m EV3 self-expanding stent and 6x3 in prox region (Dr. JAdora Fridge  . LOWER EXTREMITY ANGIOGRAM    . LOWER EXTREMITY ARTERIAL  DOPPLER  2013   right SFA with 50-69% diameter reduction, right PTA/peroneal occluded, L SFA prox to stent has narrowing with increased velocities >60% diameter reduction, L SFA stent patent, L ATA w/occlusive disease, bilat ABIs show mild arterial insuffiency at rest  . NM MYOCAR PERF WALL MOTION  10/2011   non-gated - normal stuy, EF 82%, normal LV wall motion  . PACEMAKER IMPLANT N/A 01/09/2018   Procedure: PACEMAKER IMPLANT;  Surgeon:  Evans Lance, MD;  Location: [place] [facility];  Service: Cardiovascular;  Laterality: N/A;  . REVERSE SHOULDER ARTHROPLASTY Right 01/26/2013   Procedure: RIGHT REVERSE TOTAL SHOULDER ARTHROPLASTY;  Surgeon: Augustin Schooling, MD;  Location: [facility];  Service: Orthopedics;  Laterality: Right;  . TRANSTHORACIC ECHOCARDIOGRAM  10/2012   EF 75-70%, mod conc hypertrophy, severely calcified MV annulus, LA mildly dailted, PA peak pressure 62mHg    Allergies  Allergen Reactions  . Atorvastatin Calcium     Other reaction(s): Myalgias (intolerance) Muscle pain  . Crestor [Rosuvastatin Calcium]     Muscle pain  . Lipitor [Atorvastatin Calcium]     Muscle pain  . Rosuvastatin Calcium     Other reaction(s): Myalgias (intolerance) Muscle pain    Current Outpatient Medications  Medication Sig Dispense Refill  . amLODipine (NORVASC) 2.5 MG tablet Take 1 tablet (2.5 mg total) by mouth daily. 90 tablet 3  . aspirin EC 81 MG tablet Take 81 mg by mouth every evening.     . citalopram (CELEXA) 10 MG tablet TAKE 1 TABLET BY MOUTH EVERY DAY 90 tablet 1  . clopidogrel (PLAVIX) 75 MG tablet Take 1 tablet (75 mg total) by mouth daily. 90 tablet 3  . ezetimibe (ZETIA) 10 MG tablet Take 1 tablet (10 mg total) by mouth daily. 90 tablet 3  . feeding supplement, ENSURE COMPLETE, (ENSURE COMPLETE) LIQD Take 237 mLs by mouth 3 (three) times daily between meals.    .Marland Kitchenlosartan (COZAAR) 100 MG tablet TAKE 1 TABLET BY MOUTH EVERY DAY 90 tablet 3  . metoprolol succinate (TOPROL-XL)  100 MG 24 hr tablet Take 1 tablet (100 mg total) by mouth daily. Take with or immediately following a meal. 90 tablet 3  . Multiple Vitamins-Minerals (SENIOR MULTIVITAMIN PLUS PO) Take 1 tablet by mouth daily.    .Marland Kitchenomega-3 acid ethyl esters (LOVAZA) 1 g capsule Take 1 capsule (1 g total) by mouth every evening. 90 capsule 3  . pantoprazole (PROTONIX) 40 MG tablet Take 1 tablet (40 mg total) by mouth 2 (two) times daily. (Patient taking differently: Take 40 mg by mouth daily as needed (for acid reflux). ) 180 tablet 3  . Pitavastatin Calcium (LIVALO) 2 MG TABS Take 1 tablet (2 mg total) by mouth daily. 90 tablet 3  . timolol (TIMOPTIC) 0.5 % ophthalmic solution Place 1 drop into both eyes 2 (two) times daily.      No current facility-administered medications for this visit.     Social History   Socioeconomic History  . Marital status: Widowed    Spouse name: Not on file  . Number of children: 4  . Years of education: Not on file  . Highest education level: Not on file  Occupational History  . Not on file  Social Needs  . Financial resource strain: Not on file  . Food insecurity:    Worry: Not on file    Inability: Not on file  . Transportation needs:    Medical: Not on file    Non-medical: Not on file  Tobacco Use  . Smoking status: Former Smoker    Last attempt to quit: 05/07/1984    Years since quitting: 34.1  . Smokeless tobacco: Never Used  Substance and Sexual Activity  . Alcohol use: No  . Drug use: No  . Sexual activity: Not Currently  Lifestyle  . Physical activity:    Days per week: Not on file    Minutes per session: Not on file  .  Stress: Not on file  Relationships  . Social connections:    Talks on phone: Not on file    Gets together: Not on file    Attends religious service: Not on file    Active member of club or organization: Not on file    Attends meetings of clubs or organizations: Not on file    Relationship status: Not on file  . Intimate partner  violence:    Fear of current or ex partner: Not on file    Emotionally abused: Not on file    Physically abused: Not on file    Forced sexual activity: Not on file  Other Topics Concern  . Not on file  Social History Narrative  . Not on file    Family History  Problem Relation Age of Onset  . Throat cancer Father   . Cancer Mother   . CAD Son   . Breast cancer Sister        cervical cancer  . Colon cancer Brother        throat cancer  . Pulmonary embolism Daughter   . 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 Redmond Baseman Pulmonary: Negative; No cough, wheezing, shortness of breath, hemoptysis Cardiovascular: See HPI:  Positive for claudication GI: Positive for GERD; No nausea, vomiting, diarrhea, or abdominal pain GU: Negative; No dysuria, hematuria, or difficulty voiding Musculoskeletal: Negative; no myalgias, joint pain, or weakness Hematologic: Negative; no easy bruising, bleeding Endocrine: Negative; no heat/cold intolerance; no diabetes, Neuro: Negative; no changes in balance, headaches; mild dementia Skin: Negative; No rashes or skin lesions Psychiatric: Negative; No behavioral problems, depression Sleep: Positive for obstructive sleep apnea, CPAP instituted May 19, 2018  +daytime sleepiness, hypersomnolence, no bruxism, restless legs, hypnogognic hallucinations. Other comprehensive 14 point system review is negative   Physical Exam BP (!) 144/68   Pulse 62   Ht 5' (1.524 m)   Wt 118 lb (53.5 kg)   BMI 23.05 kg/m     Wt Readings from Last 3 Encounters:  06/05/18 118 lb (53.5 kg)  04/07/18 120 lb (54.4 kg)  04/03/18 122 lb 6.4 oz (55.5 kg)   General: Alert, oriented, no distress.  Skin: normal turgor, no rashes, warm and dry HEENT:  Normocephalic, atraumatic. Pupils equal round and reactive to light; sclera anicteric; extraocular muscles intact;  Nose without nasal septal hypertrophy Mouth/Parynx benign; Mallinpatti scale 3 Neck: No JVD, no carotid bruits; normal carotid upstroke.  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 Back: no CVA tenderness Pulses 2+ Musculoskeletal: full range of motion, normal strength, no joint deformities Extremities: no clubbing cyanosis or edema, Homan's sign negative  Neurologic: grossly nonfocal; Cranial nerves grossly wnl Psychologic: Normal mood and affect   April 2019 ECG (independently read by me): Sinus rhythm at 61 with first-degree AV block with a PR interval of 240 msec.  May 2018 ECG (independently read by me): Sinus bradycardia 52 bpm.  First degree AV block with a PR interval at 248  ms.  Nonspecific ST-T changes  November 2017 ECG (independently read by me), with rhythm strip: Sinus bradycardia 57 bpm.  First-degree AV block with a PR interval at 256 ms.  Rare PVC with VA conduction.  April 2016 ECG (independently read by me): Sinus bradycardia 55 bpm.  First-degree AV block with a PR interval at 246 ms.  Nonspecific ST-T changes.  November 2015 ECG (independently read by me): sinus bradycardia at 59 bpm with first-degree AV block.  LVH with repolarization changes.  ECG (independently read by me) normal sinus rhythm at 66 beats per minute.  First degree AV block with a PR interval at 236 ms.  June 2015 ECG (independently read by me): Sinus bradycardia 59 beats per minute.  First degree AV block  with PR interval 240 ms.  Nonspecific ST-T changes.  LABS: BMP Latest Ref Rng & Units 03/29/2018 03/09/2018 03/09/2018  Glucose 65 - 99 mg/dL 144(H) 92 -  BUN 6 - 20 mg/dL 16 13 -  Creatinine 0.44 - 1.00 mg/dL 1.05(H) 1.04(H) -  BUN/Creat Ratio 12 - 28 - 13 -  Sodium 135 - 145 mmol/L 140 148(H) -  Potassium 3.5 - 5.1 mmol/L 4.4 4.7 -  Chloride 101 - 111 mmol/L 107 107(H) -  CO2 22 - 32 mmol/L 25 22 -  Calcium 8.9 - 10.3 mg/dL 9.8 10.9(H) 10.6(H)   Hepatic Function Latest Ref Rng & Units 02/21/2018 04/05/2017 10/14/2016  Total Protein 6.0 - 8.5 g/dL 7.5 6.9 6.9  Albumin 3.2 - 4.6 g/dL 4.6 4.4 4.3  AST 0 - 40 IU/L 21 14 23   ALT 0 - 32 IU/L 14 13 13   Alk Phosphatase 39 - 117 IU/L 53 43 35  Total Bilirubin 0.0 - 1.2 mg/dL 0.9 0.8 1.0   CBC Latest Ref Rng & Units 03/29/2018 01/10/2018 01/08/2018  WBC 4.0 - 10.5 K/uL 7.4 - 9.1  Hemoglobin 12.0 - 15.0 g/dL 14.9 14.3 14.7  Hematocrit 36.0 - 46.0 % 46.4(H) 43.3 45.6  Platelets 150 - 400 K/uL 173 - 160   Lab Results  Component Value Date   TSH 2.280 02/21/2018   Lipid Panel     Component Value Date/Time   CHOL 156 01/08/2018 0639   TRIG 75 01/08/2018 0639   HDL 70 01/08/2018 0639   CHOLHDL 2.2 01/08/2018 0639   VLDL 15 01/08/2018 0639   LDLCALC 71 01/08/2018 0639     RADIOLOGY: Mm Digital Screening Bilateral  04/25/2014   CLINICAL DATA:  Screening.  EXAM: DIGITAL SCREENING BILATERAL MAMMOGRAM WITH CAD  COMPARISON:  02/17/2012, 09/22/2010.  ACR Breast Density Category b: There are scattered areas of fibroglandular density.  FINDINGS: In the right breast, skin thickening warrants further evaluation with physical examination and possibly ultrasound. In the left breast, no findings suspicious for malignancy. Images were processed with CAD.  IMPRESSION: Further evaluation is suggested for possible skin thickening in the right breast.  RECOMMENDATION: Physical examination and possibly ultrasound of the right breast. (Code:FI-R-60M)  The patient  will be contacted regarding the findings, and additional imaging will be scheduled.  BI-RADS CATEGORY  0: Incomplete. Need additional imaging evaluation and/or prior mammograms for comparison.   Electronically Signed   By: Luberta Robertson M.D.   On: 04/25/2014 07:41   US Breast Ltd Uni Right Inc Axilla  05/08/2014   CLINICAL DATA:  Screening callback for right breast skin thickening asymmetrically in the inferior breast. The patient denies a history of congestive heart failure. She had right shoulder surgery approximately 1  year ago but denies right arm asymmetric edema.  EXAM: ULTRASOUND OF THE right BREAST  COMPARISON:  Prior mammograms  FINDINGS: On physical exam, I palpate no abnormality in the left breast 6 o'clock/ inferior breast. There is no skin thickening, induration, or erythema.  Ultrasound is performed, showing minimal skin thickening in the right inferior breast measuring 3 mm as compared to the right breast 12 o'clock location measuring 2 mm in maximal thickness. No underlying intramammary abnormality is identified.  IMPRESSION: Minimal asymmetric prominence of the right inferior breast skin without underlying intramammary abnormality. There is no evidence for active infection or other dermatologic abnormality. This is of unclear clinical significance and clinical followup is recommended as part of the patient's routine healthcare maintenance.  RECOMMENDATION: Screening mammogram in one year.(Code:SM-B-01Y)  I have discussed the findings and recommendations with the patient. Results were also provided in writing at the conclusion of the visit. If applicable, a reminder letter will be sent to the patient regarding the next appointment.  BI-RADS CATEGORY  1: Negative.   Electronically Signed   By: Conchita Paris M.D.   On: 05/08/2014 15:34   IMPRESSION:  1. Obstructive sleep apnea (adult) (pediatric)   2. Essential hypertension   3. Pure hyperglyceridemia   4. Complete heart block ([place])   5.  Pacemaker   6. PAD (peripheral artery disease) ([place])      ASSESSMENT AND PLAN: JulieDavisson is a 84 year old female with a history of hypertension, moderate concentric left ventricular hypertrophy with LVOT gradient at the midcavity level, mitral annular calcification and mild pulmonary hypertension.  She has previously had a history of nonsustained ventricular tachycardia which had responded to increasing beta blocker therapy.  When I saw her in 2018, she was on beta-blocker therapy was reduced due to fatigability.  In February 2019, she developed a syncopal spell and interrogation of her loop recorder showed a period of sinus arrest/asystole for 30 seconds.  She underwent successful implantation of a pacemaker.  She has not had any recurrent symptoms of presyncope or syncope.  When I saw her in follow-up her blood pressure was elevated and I suggested resumption of metoprolol with gradual titration upward depending upon blood pressure response.  Blood pressure today is improved minimally increased on amlodipine 2.5 mg daily, losartan 100 mg, and Toprol-XL 100 mg daily.  She continues to tolerate and remains on Livalo 2 mg daily with most recent LDL in February 2019 at 35.  She is status post permanent pacemaker.  With reference to her PAD, she has not had any claudication symptoms.  I reviewed her sleep study in detail with her.  She has severe sleep apnea.  I spent significant time with her discussing the importance of using her CPAP particularly with reference to her cardiovascular comorbidities.  I discussed the importance of meeting compliance standards.  Her study was a home study and does not indicate severity of sleep apnea with REM sleep.  Her daughter [name] was with her in the office today who is her sleep coordinator.  Patient states that she has not been using the CPAP because she cannot get used to it.  I discussed with her changing her full facemask to a nasal type mask which may provide greater  comfort.  She agrees to reinitiate.  She is fully aware that she must meet compliance standards within 90 days of CPAP set up.  I will follow-up downloads frequently to make sure she is meeting compliance standards.  I will see her  in 2 months for reevaluation   Troy Sine, MD, [facility]  06/07/2018 8:31 AM

## 2018-06-05 NOTE — Patient Instructions (Signed)
Medication Instructions:  Your physician recommends that you continue on your current medications as directed. Please refer to the Current Medication list given to you today.  Follow-Up: 9/30 at 4 pm with Dr. Claiborne Billings   Any Other Special Instructions Will Be Listed Below (If Applicable).     If you need a refill on your cardiac medications before your next appointment, please call your pharmacy.

## 2018-06-07 ENCOUNTER — Encounter: Payer: Self-pay | Admitting: Cardiovascular Disease

## 2018-06-08 ENCOUNTER — Other Ambulatory Visit: Payer: Self-pay | Admitting: Cardiovascular Disease

## 2018-06-08 DIAGNOSIS — I739 Peripheral vascular disease, unspecified: Secondary | ICD-10-CM

## 2018-06-19 DIAGNOSIS — G4733 Obstructive sleep apnea (adult) (pediatric): Secondary | ICD-10-CM | POA: Diagnosis not present

## 2018-06-30 ENCOUNTER — Encounter (HOSPITAL_COMMUNITY): Payer: Medicare Other

## 2018-06-30 DIAGNOSIS — H401133 Primary open-angle glaucoma, bilateral, severe stage: Secondary | ICD-10-CM | POA: Diagnosis not present

## 2018-07-02 IMAGING — DX DG SHOULDER 2+V*R*
2 series · 2 of 2 positions shown · non-contrast
Comparison: 08/23/2014

CLINICAL DATA: Right shoulder pain

EXAM:
RIGHT SHOULDER - 2+ VIEW

[shoulder grashey]
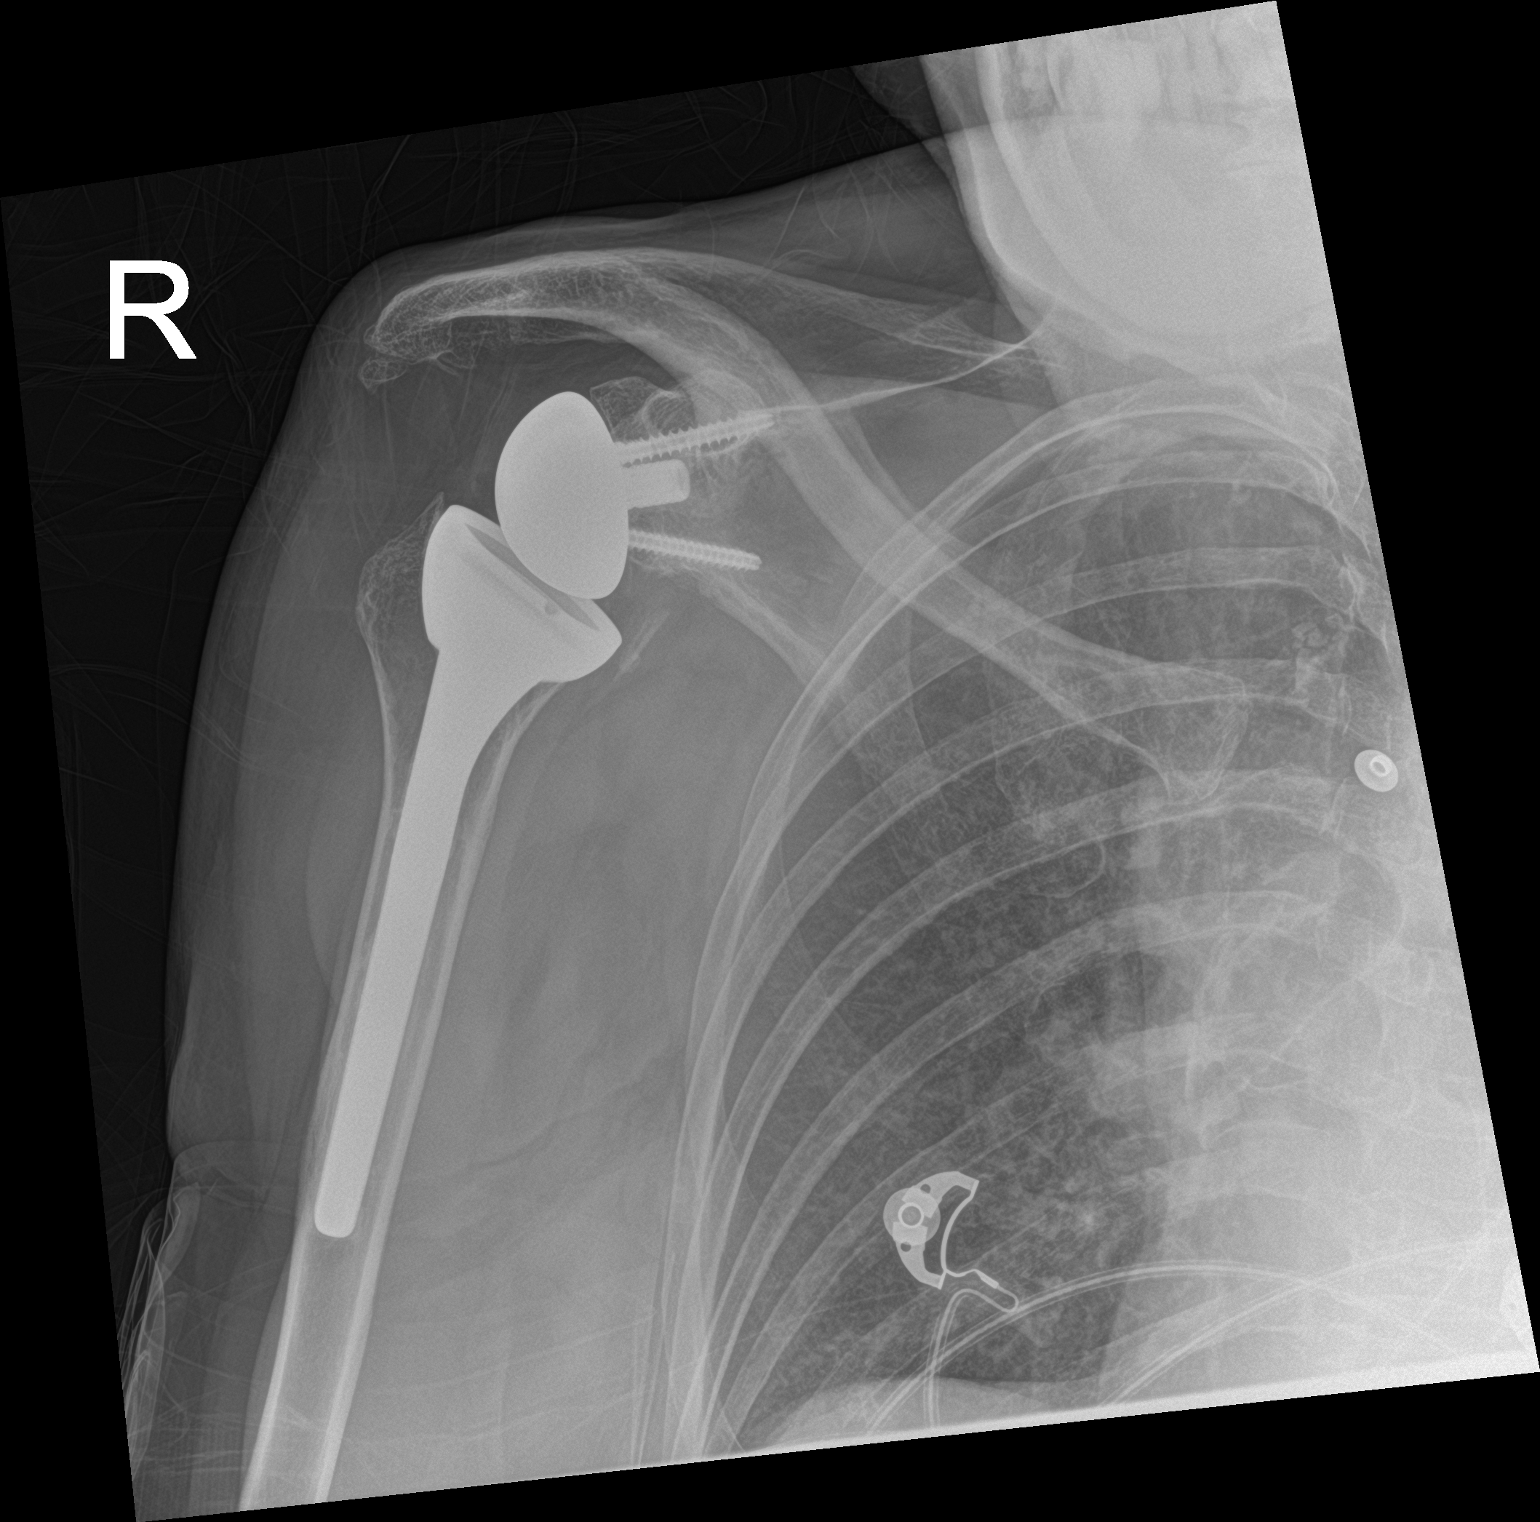

[shoulder y view]
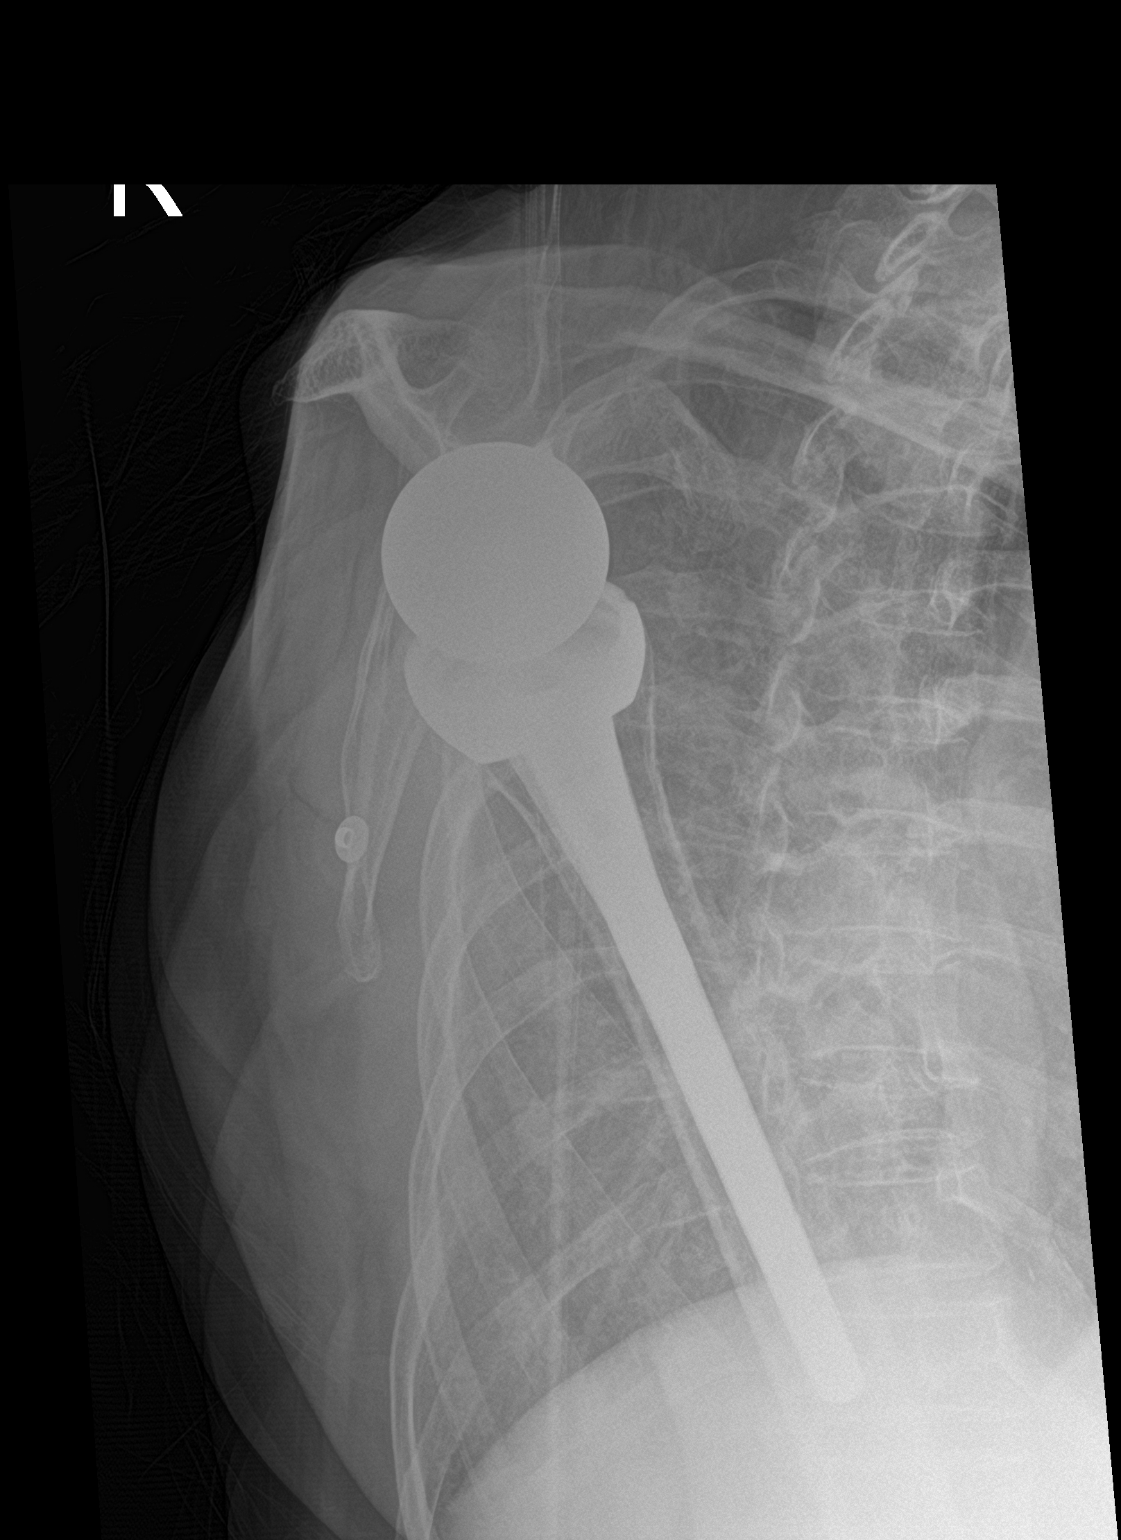

[2 of 2 positions shown; findings below may reference images not displayed]

FINDINGS: Status post reverse right shoulder replacement with intact hardware.
Normal alignment. No obvious fracture.
IMPRESSION: Status post right shoulder replacement. No definite acute osseous
abnormality

## 2018-07-03 ENCOUNTER — Ambulatory Visit (INDEPENDENT_AMBULATORY_CARE_PROVIDER_SITE_OTHER): Payer: Medicare Other | Admitting: *Deleted

## 2018-07-03 DIAGNOSIS — I442 Atrioventricular block, complete: Secondary | ICD-10-CM

## 2018-07-03 LAB — CUP PACEART REMOTE DEVICE CHECK
Battery Remaining Percentage: 95 %
Brady Statistic RA Percent Paced: 70 %
Brady Statistic RV Percent Paced: 0 %
Date Time Interrogation Session: 20190927052118
Implantable Lead Implant Date: 20190225
Implantable Lead Implant Date: 20190225
Implantable Lead Location: 753859
Implantable Lead Location: 753860
Implantable Lead Model: 377
Implantable Lead Model: 377
Implantable Lead Serial Number: 80514208
Implantable Lead Serial Number: 80598782
Implantable Pulse Generator Implant Date: 20190225
Lead Channel Impedance Value: 488 Ohm
Lead Channel Impedance Value: 546 Ohm
Lead Channel Pacing Threshold Amplitude: 0.6 V
Lead Channel Pacing Threshold Amplitude: 0.8 V
Lead Channel Pacing Threshold Pulse Width: 0.4 ms
Lead Channel Pacing Threshold Pulse Width: 0.4 ms
Lead Channel Setting Pacing Amplitude: 3 V
Lead Channel Setting Pacing Amplitude: 3 V
Lead Channel Setting Pacing Pulse Width: 0.4 ms
Pulse Gen Model: 407145
Pulse Gen Serial Number: 69245949

## 2018-07-04 NOTE — Progress Notes (Signed)
Remote pacemaker transmission.   

## 2018-07-20 DIAGNOSIS — G4733 Obstructive sleep apnea (adult) (pediatric): Secondary | ICD-10-CM | POA: Diagnosis not present

## 2018-07-20 MED FILL — LATANOPROST 0.005% EYE DRP: 0.005 | 50 days supply | Qty: 3 | Fill #0

## 2018-08-03 ENCOUNTER — Encounter (HOSPITAL_COMMUNITY): Payer: Medicare Other

## 2018-08-03 ENCOUNTER — Inpatient Hospital Stay (HOSPITAL_COMMUNITY): Admission: RE | Admit: 2018-08-03 | Payer: Medicare Other | Source: Ambulatory Visit

## 2018-08-14 ENCOUNTER — Encounter: Payer: Self-pay | Admitting: Cardiovascular Disease

## 2018-08-14 ENCOUNTER — Ambulatory Visit (INDEPENDENT_AMBULATORY_CARE_PROVIDER_SITE_OTHER): Payer: Medicare Other | Admitting: Cardiovascular Disease

## 2018-08-14 VITALS — BP 162/72 | HR 69 | Ht 60.0 in | Wt 120.4 lb

## 2018-08-14 DIAGNOSIS — E785 Hyperlipidemia, unspecified: Secondary | ICD-10-CM | POA: Diagnosis not present

## 2018-08-14 DIAGNOSIS — Z950 Presence of cardiac pacemaker: Secondary | ICD-10-CM

## 2018-08-14 DIAGNOSIS — G4733 Obstructive sleep apnea (adult) (pediatric): Secondary | ICD-10-CM

## 2018-08-14 DIAGNOSIS — I119 Hypertensive heart disease without heart failure: Secondary | ICD-10-CM | POA: Diagnosis not present

## 2018-08-14 DIAGNOSIS — I10 Essential (primary) hypertension: Secondary | ICD-10-CM

## 2018-08-14 DIAGNOSIS — F0390 Unspecified dementia without behavioral disturbance: Secondary | ICD-10-CM

## 2018-08-14 NOTE — Progress Notes (Signed)
Patient ID: TYSHAWN KEEL, female   DOB: Sep 30, 1926, 84 y.o.   MRN: [id number]    PCP: Dr. Jill Alexanders  HPI: BRIZA BARK is a 84 y.o. female who presents to the office today for a 2 month follow-up cardiology/ sleep evaluation.  Ms. Severtson has a history of hypertension, hyperlipidemia, peripheral vascular disease, as well as episodes of nonsustained ventricular tachycardia, for, which she's been treated with beta blocker therapy.  An echo Doppler study in December 2013 showed hyperdynamic LV function with an ejection fraction of at least 90%, grade 1 diastolic dysfunction, and dynamic obstruction in the mid cavity of the left ventricle with a gradient of 10 mm.  She had severely calcified mitral annulus and mild left atrial dilatation.  She has documented nonsustained ventricular tachycardia, which was picked up on a remote event monitor, and she has tolerated titration of her beta blocker therapy.  She also has had difficulty with statin intolerance for hyperlipidemia, but has tolerated livalo 2 mg daily.  She also has history of hypertension and has been on losartan 100 mg in addition to her metoprolol.  She has peripheral vascular disease and is status post stenting of her left SFA in the past.  She has one vessel runoff bilaterally and has moderate right SFA disease.  Her last LE Doppler study was in August 2013 , which showed occluded right PTA and peroneal occluded left PTA, left ATA with reconstitution of the dorsalis pedis artery.  ABIs were 0.88 on the right and 0.86 on the left.  She has been on aspirin 81 mg, and Plavix 75 mg   A 2 year followup echo Doppler study on 05/15/2014 showed an ejection fraction of 65-70% and there was grade 1 diastolic dysfunction.  There was moderate mitral annular calcification.  There was no mention of any dynamic obstruction.  Lower extremity Doppler studies revealed a patent left SFA stent with prior disease noted in several vessels, not significantly  changed from 2013.  She was hospitalized on 08/23/2014 after developing a syncopal spell.  She was not significantly orthostatic to her hospitalization.  Upon presentation her blood pressure was 170/58.  Her ECG showed sinus rhythm with mild ST changes laterally, and first degree heart block.  She was noted to have a single 15 beat run of nonsustained VT.  She ultimately underwent implantation of a loop recorder on 08/27/2014.  She underwent carotid duplex imaging and findings suggest 1-39% internal carotid artery stenosis bilaterally. The left vertebral artery is patent with antegrade flow but the the right vertebral artery was not able to be visualized.  After discharge, the loop recorder detected 2 episodes of questionable transient asystole.  Upon review of these tracings with Dr. Sallyanne Kuster it appears that these may very well be artifactual.  One episode was recorded on October 14, and another on October 16.  The episode in October 16 tach, rate of less than over 30 seconds.  The patient was with her daughter, who is my nurse at the time.  She denies any symptoms at this time.  Specifically, she denied any lightheadedness, and one would expect that with a >30 second pause she would have been significantly symptomatic and this would have also been noted by her daughter.  She denies any major episodes of dizziness but if she gets up quickly or lies down fast.  A CT of her abdomen showed atherosclerotic plaque throughout her abdominal aorta with suggestion of a tiny 0.5 x 0.2 cm atherosclerotic ulcer.  She was also noted to have plaque that was irregular throughout the right superficial femoral artery and also plaque in the popliteal artery, not resulting in hemodynamically significance stenosis.  She had single-vessel runoff to the right lower leg and foot.  The right anterior tibial artery with atretic reconstitution of both posterior tibial and peroneal vascular distributions.  The right dorsalis pedis  artery was patent to the level of the forefoot.  She had stents noted in the distal aspect of her left common femoral artery and single vessel runoff to the left lower leg and foot.  There was occlusion of both the left anterior, posterior tibial arteries.  The peroneal artery was found to reconstitute a dorsalis pedis artery at the level of the hindfoot.  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    She has underone neurologic evaluation with Dr. Corwin Levins and is felt to have moderate  dementia felt to be Alzheimer's  versus vascular dementia.  A CT scan of her head revealed mild cortical atrophy and moderately advanced small vessel ischemic changes involving the periventricular white matter with remote infarction in the right frontal lobe.   In October 2017, she was found to have an episode of irregular nonsustained wide complex tachycardia with a maximum rate at 167 bpm and ultimately reverted back to sinus rhythm.  She was completely asymptomatic and this occurred while she was in bed.  At that time, her metoprolol dose, which had been 75 Mill grams twice a day was increased to 100 mg in the morning and 75 mg at night.  She has continued to take losartan 100 mg daily as well as amlodipine 5 mg for blood pressure control.  She has felt well on the increased beta blocker regimen.  She has a history of hyperlipidemia and has been able to tolerate Livalo but could not tolerate other statins.  Her current Livalo dose is  mg daily and she also takes Zetia 10 mg.  She is on gabapentin for neuropathy.  She also is on Celexa.  She underwent an echo Doppler study yesterday, 10/13/2016 which showed an EF of 55-60%.  There was mild LVH.  There was grade 2 diastolic dysfunction with abnormal tissue Doppler suggesting high ventricular filling pressures.  Her aortic valve mobility was restricted but she did not have stenosis.   There was mitral annular calcification.  There is mild left atrial dilatation.  There was mild increased pulmonary pressure.    She has had issues with short-term memory loss. When  I last saw her in May 2018 she denied any episodes of chest pain or shortness of breath.  She was fatigued and was sleeping a lot.  She denied any episodes of tachycardia, presyncope or syncope.    A loop recorder assessment from 01/15/2017 through 03/15/2017 did not show any episodes of atrial fibrillation, tachycardia, bradycardia, pauses.  Laboratory was done 2 days ago.  TSH 2.33.  Hemoglobin 14.9, hematocrit 45.7.  BUN 18, creatinine 1.24.  LFTs were normal.  On Livalo  cholesterol 186, HDL 79, triglycerides 85, and LDL 90.    She developed an episode of syncope in February 2019 and was found to have complete heart block with a prolonged sinus pause for close to 30 seconds.  She was seen by Dr. Lovena Le during her hospitalization and underwent insertion of a permanent pacemaker [employer] End[employer] 8 DR-T.  Echo Doppler study on January 10, 2018 showed an EF of 70 to 75%.  There was dynamic obstruction at the mid cavity level with a peak velocity of 300 cm/s with peak gradient of 36 mm.  There was grade 1 diastolic dysfunction.  There was moderately severe mitral annular calcification.  She was subsequently seen in the office by Dr. Sallyanne Kuster who follows her pacemaker.  Remotely, she had previously experienced episodes of nonsustained VT.  Her beta-blocker had been held prior to insertion of her pacemaker after she presented with her prolonged pause and there was plans to later potentially reinitiate therapy.  There is also been concern in the past of probable obstructive sleep apnea but the patient had still never been tested.  She has seen Dr. Sallyanne Kuster in follow-up of an emergency room evaluation in May when she presented with generalized weakness to the emergency room.  Her pacemaker was interrogated and there was no signs of  arrhythmia.  She did not have any associated chest pain.  She was ultimately referred for a home sleep study due to significant concerns for obstructive sleep apnea.  She was found to have severe obstructive sleep apnea with an AHI 51.8/h.  Events were worse with supine position with an AHI 53.6/h.  Oxygen desaturated to 87%.  Her CPAP set up date was May 19, 2018 with [facility] is her DME company.  An initial download obtained in the office for the last 4 weeks today to the office today which showed very poor compliance with only 5 out of 17 usage days and average usage at 3 hours and 12 minutes.  According to the patient's daughter, the patient is very tired.  Her sleep is nonrestorative.  She snores.  She has no energy.    When I saw her in July I discussed the importance of initiating CPAP with compliance.  Her grandson was putting the mask on for proper fitting.  New download was obtained from July 21 through July 03, 2018 and she met compliance with 80% of usage days and usage greater than 4 hours at 70%.  She was averaging 7 hours and 58 minutes per night.  Her AHI was 3.7 on CPAP auto with a 95th percentile pressure at 10.5.  She had occasional moderate mask leak.  45 her daughter, she has had issues with cognitive memory.  Recently, she often has not been using her CPAP since she often waits for her grandson to come home late to put it on and then at times she does not remember how to put the mask on herself.  As result, a recent download from August 31 through September 29 showed very poor compliance.  Usage days was only 17%.  She is unaware of any episodes of presyncope or syncope.  She denies any episodes of chest pain.  She is unaware of palpitations.  She continues to be on aspirin and Plavix.  She continues to tolerate Livalo 2 mg in addition to Zetia for hyperlipidemia.  She is on losartan 100 mg, Toprol-XL 100 mg in addition to amlodipine 2.5 mg for hypertension.  She presents for  evaluation.  Past Medical History:  Diagnosis Date  . Arthritis   . Chronic kidney disease    KIDNEY STONES  . Dementia    BEGINNING STAGES   . Depression   . GERD (gastroesophageal reflux disease)   . Glaucoma   . Hiatal hernia   . HTN (hypertension) 10/18/2011  . Hyperlipemia 10/18/2011  . NSVT (nonsustained ventricular tachycardia) ([place])  10/18/2011  . Osteoporosis   . PAD (peripheral artery disease) ([place]) 10/18/2011   h/o left SFA stent    Past Surgical History:  Procedure Laterality Date  . ABDOMINAL HYSTERECTOMY  1973  . CARDIAC CATHETERIZATION     2012  . CHOLECYSTECTOMY    . LOOP RECORDER IMPLANT N/A 08/27/2014   Procedure: LOOP RECORDER IMPLANT;  Surgeon: Coralyn Mark, MD;  Location: [facility];  Service: Cardiovascular;  Laterality: N/A;  . LOOP RECORDER REMOVAL N/A 01/09/2018   Procedure: LOOP RECORDER REMOVAL;  Surgeon: Evans Lance, MD;  Location: [facility];  Service: Cardiovascular;  Laterality: N/A;  . LOWER EXTREMITY ANGIOGRAM  11/02/2007   stent of mid left SFA with 6x170m EV3 self-expanding stent and 6x3 in prox region (Dr. JAdora Fridge  . LOWER EXTREMITY ANGIOGRAM    . LOWER EXTREMITY ARTERIAL DOPPLER  2013   right SFA with 50-69% diameter reduction, right PTA/peroneal occluded, L SFA prox to stent has narrowing with increased velocities >60% diameter reduction, L SFA stent patent, L ATA w/occlusive disease, bilat ABIs show mild arterial insuffiency at rest  . NM MYOCAR PERF WALL MOTION  10/2011   non-gated - normal stuy, EF 82%, normal LV wall motion  . PACEMAKER IMPLANT N/A 01/09/2018   Procedure: PACEMAKER IMPLANT;  Surgeon: TEvans Lance MD;  [facility];  Service: Cardiovascular;  Laterality: N/A;  . REVERSE SHOULDER ARTHROPLASTY Right 01/26/2013   Procedure: RIGHT REVERSE TOTAL SHOULDER ARTHROPLASTY;  Surgeon: SAugustin Schooling MD;  Location: [facility]  Service: Orthopedics;  Laterality: Right;  . TRANSTHORACIC ECHOCARDIOGRAM   10/2012   EF 75-70%, mod conc hypertrophy, severely calcified MV annulus, LA mildly dailted, PA peak pressure 346mg    Allergies  Allergen Reactions  . Atorvastatin Calcium     Other reaction(s): Myalgias (intolerance) Muscle pain  . Crestor [Rosuvastatin Calcium]     Muscle pain  . Lipitor [Atorvastatin Calcium]     Muscle pain  . Rosuvastatin Calcium     Other reaction(s): Myalgias (intolerance) Muscle pain    Current Outpatient Medications  Medication Sig Dispense Refill  . amLODipine (NORVASC) 2.5 MG tablet Take 1 tablet (2.5 mg total) by mouth daily. 90 tablet 3  . aspirin EC 81 MG tablet Take 81 mg by mouth every evening.     . citalopram (CELEXA) 10 MG tablet TAKE 1 TABLET BY MOUTH EVERY DAY 90 tablet 1  . clopidogrel (PLAVIX) 75 MG tablet Take 1 tablet (75 mg total) by mouth daily. 90 tablet 3  . ezetimibe (ZETIA) 10 MG tablet Take 1 tablet (10 mg total) by mouth daily. 90 tablet 3  . feeding supplement, ENSURE COMPLETE, (ENSURE COMPLETE) LIQD Take 237 mLs by mouth 3 (three) times daily between meals.    . Marland Kitchenosartan (COZAAR) 100 MG tablet TAKE 1 TABLET BY MOUTH EVERY DAY 90 tablet 3  . metoprolol succinate (TOPROL-XL) 100 MG 24 hr tablet Take 1 tablet (100 mg total) by mouth daily. Take with or immediately following a meal. 90 tablet 3  . Multiple Vitamins-Minerals (SENIOR MULTIVITAMIN PLUS PO) Take 1 tablet by mouth daily.    . Marland Kitchenmega-3 acid ethyl esters (LOVAZA) 1 g capsule Take 1 capsule (1 g total) by mouth every evening. 90 capsule 3  . pantoprazole (PROTONIX) 40 MG tablet Take 1 tablet (40 mg total) by mouth 2 (two) times daily. (Patient taking differently: Take 40 mg by mouth daily as needed (for acid reflux). ) 180 tablet  3  . Pitavastatin Calcium (LIVALO) 2 MG TABS Take 1 tablet (2 mg total) by mouth daily. 90 tablet 3  . timolol (TIMOPTIC) 0.5 % ophthalmic solution Place 1 drop into both eyes 2 (two) times daily.      No current facility-administered medications for  this visit.     Social History   Socioeconomic History  . Marital status: Widowed    Spouse name: Not on file  . Number of children: 4  . Years of education: Not on file  . Highest education level: Not on file  Occupational History  . Not on file  Social Needs  . Financial resource strain: Not on file  . Food insecurity:    Worry: Not on file    Inability: Not on file  . Transportation needs:    Medical: Not on file    Non-medical: Not on file  Tobacco Use  . Smoking status: Former Smoker    Last attempt to quit: 05/07/1984    Years since quitting: 34.2  . Smokeless tobacco: Never Used  Substance and Sexual Activity  . Alcohol use: No  . Drug use: No  . Sexual activity: Not Currently  Lifestyle  . Physical activity:    Days per week: Not on file    Minutes per session: Not on file  . Stress: Not on file  Relationships  . Social connections:    Talks on phone: Not on file    Gets together: Not on file    Attends religious service: Not on file    Active member of club or organization: Not on file    Attends meetings of clubs or organizations: Not on file    Relationship status: Not on file  . Intimate partner violence:    Fear of current or ex partner: Not on file    Emotionally abused: Not on file    Physically abused: Not on file    Forced sexual activity: Not on file  Other Topics Concern  . Not on file  Social History Narrative  . Not on file    Family History  Problem Relation Age of Onset  . Throat cancer Father   . Cancer Mother   . CAD Son   . Breast cancer Sister        cervical cancer  . Colon cancer Brother        throat cancer  . Pulmonary embolism Daughter   . 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 Redmond Baseman Pulmonary:  Negative; No cough, wheezing, shortness of breath, hemoptysis Cardiovascular: See HPI:  Positive for claudication GI: Positive for GERD; No nausea, vomiting, diarrhea, or abdominal pain GU: Negative; No dysuria, hematuria, or difficulty voiding Musculoskeletal: Negative; no myalgias, joint pain, or weakness Hematologic: Negative; no easy bruising, bleeding Endocrine: Negative; no heat/cold intolerance; no diabetes, Neuro: Negative; no changes in balance, headaches; mild dementia  Skin: Negative; No rashes or skin lesions Psychiatric: Negative; No behavioral problems, depression Sleep: Positive for obstructive sleep apnea, CPAP instituted May 19, 2018  +daytime sleepiness, hypersomnolence, no bruxism, restless legs, hypnogognic hallucinations. Other comprehensive 14 point system review is negative   Physical Exam BP (!) 162/72   Pulse 69   Ht 5' (1.524 m)   Wt 120 lb 6.4 oz (54.6 kg)   BMI  23.51 kg/m    Repeat blood pressure by me 140/76  Wt Readings from Last 3 Encounters:  08/14/18 120 lb 6.4 oz (54.6 kg)  06/05/18 118 lb (53.5 kg)  04/07/18 120 lb (54.4 kg)   General: Alert, oriented, no distress.  Skin: normal turgor, no rashes, warm and dry HEENT: Normocephalic, atraumatic. Pupils equal round and reactive to light; sclera anicteric; extraocular muscles intact;  Nose without nasal septal hypertrophy Mouth/Parynx benign; Mallinpatti scale Neck: No JVD, no carotid bruits; normal carotid upstroke; Warthin's tumor left neck mass Lungs: clear to ausculatation and percussion; no wheezing or rales Chest wall: without tenderness to palpitation Heart: PMI not displaced, RRR, s1 s2 normal, 1/6 systolic murmur, no diastolic murmur, no rubs, gallops, thrills, or heaves Abdomen: soft, nontender; no hepatosplenomehaly, BS+; abdominal aorta nontender and not dilated by palpation. Back: no CVA tenderness Pulses 2+ Musculoskeletal: full range of motion, normal strength, no joint  deformities Extremities: no clubbing cyanosis or edema, Homan's sign negative  Neurologic: grossly nonfocal; Cranial nerves grossly wnl Psychologic: Normal mood and affect   April 2019 ECG (independently read by me): Sinus rhythm at 61 with first-degree AV block with a PR interval of 240 msec.  May 2018 ECG (independently read by me): Sinus bradycardia 52 bpm.  First degree AV block with a PR interval at 248 ms.  Nonspecific ST-T changes  November 2017 ECG (independently read by me), with rhythm strip: Sinus bradycardia 57 bpm.  First-degree AV block with a PR interval at 256 ms.  Rare PVC with VA conduction.  April 2016 ECG (independently read by me): Sinus bradycardia 55 bpm.  First-degree AV block with a PR interval at 246 ms.  Nonspecific ST-T changes.  November 2015 ECG (independently read by me): sinus bradycardia at 59 bpm with first-degree AV block.  LVH with repolarization changes.  ECG (independently read by me) normal sinus rhythm at 66 beats per minute.  First degree AV block with a PR interval at 236 ms.  June 2015 ECG (independently read by me): Sinus bradycardia 59 beats per minute.  First degree AV block with PR interval 240 ms.  Nonspecific ST-T changes.  LABS: BMP Latest Ref Rng & Units 03/29/2018 03/09/2018 03/09/2018  Glucose 65 - 99 mg/dL 144(H) 92 -  BUN 6 - 20 mg/dL 16 13 -  Creatinine 0.44 - 1.00 mg/dL 1.05(H) 1.04(H) -  BUN/Creat Ratio 12 - 28 - 13 -  Sodium 135 - 145 mmol/L 140 148(H) -  Potassium 3.5 - 5.1 mmol/L 4.4 4.7 -  Chloride 101 - 111 mmol/L 107 107(H) -  CO2 22 - 32 mmol/L 25 22 -  Calcium 8.9 - 10.3 mg/dL 9.8 10.9(H) 10.6(H)   Hepatic Function Latest Ref Rng & Units 02/21/2018 04/05/2017 10/14/2016  Total Protein 6.0 - 8.5 g/dL 7.5 6.9 6.9  Albumin 3.2 - 4.6 g/dL 4.6 4.4 4.3  AST 0 - 40 IU/L 21 14 23   ALT 0 - 32 IU/L 14 13 13   Alk Phosphatase 39 - 117 IU/L 53 43 35  Total Bilirubin 0.0 - 1.2 mg/dL 0.9 0.8 1.0   CBC Latest Ref Rng & Units  03/29/2018 01/10/2018 01/08/2018  WBC 4.0 - 10.5 K/uL 7.4 - 9.1  Hemoglobin 12.0 - 15.0 g/dL 14.9 14.3 14.7  Hematocrit 36.0 - 46.0 % 46.4(H) 43.3 45.6  Platelets 150 - 400 K/uL 173 - 160   Lab Results  Component Value Date   TSH 2.280 02/21/2018   Lipid Panel     Component  Value Date/Time   CHOL 156 01/08/2018 0639   TRIG 75 01/08/2018 0639   HDL 70 01/08/2018 0639   CHOLHDL 2.2 01/08/2018 0639   VLDL 15 01/08/2018 0639   LDLCALC 71 01/08/2018 0639     RADIOLOGY: Mm Digital Screening Bilateral  04/25/2014   CLINICAL DATA:  Screening.  EXAM: DIGITAL SCREENING BILATERAL MAMMOGRAM WITH CAD  COMPARISON:  02/17/2012, 09/22/2010.  ACR Breast Density Category b: There are scattered areas of fibroglandular density.  FINDINGS: In the right breast, skin thickening warrants further evaluation with physical examination and possibly ultrasound. In the left breast, no findings suspicious for malignancy. Images were processed with CAD.  IMPRESSION: Further evaluation is suggested for possible skin thickening in the right breast.  RECOMMENDATION: Physical examination and possibly ultrasound of the right breast. (Code:FI-R-23M)  The patient will be contacted regarding the findings, and additional imaging will be scheduled.  BI-RADS CATEGORY  0: Incomplete. Need additional imaging evaluation and/or prior mammograms for comparison.   Electronically Signed   By: Luberta Robertson M.D.   On: 04/25/2014 07:41   US Breast Ltd Uni Right Inc Axilla  05/08/2014   CLINICAL DATA:  Screening callback for right breast skin thickening asymmetrically in the inferior breast. The patient denies a history of congestive heart failure. She had right shoulder surgery approximately 1 year ago but denies right arm asymmetric edema.  EXAM: ULTRASOUND OF THE right BREAST  COMPARISON:  Prior mammograms  FINDINGS: On physical exam, I palpate no abnormality in the left breast 6 o'clock/ inferior breast. There is no skin thickening,  induration, or erythema.  Ultrasound is performed, showing minimal skin thickening in the right inferior breast measuring 3 mm as compared to the right breast 12 o'clock location measuring 2 mm in maximal thickness. No underlying intramammary abnormality is identified.  IMPRESSION: Minimal asymmetric prominence of the right inferior breast skin without underlying intramammary abnormality. There is no evidence for active infection or other dermatologic abnormality. This is of unclear clinical significance and clinical followup is recommended as part of the patient's routine healthcare maintenance.  RECOMMENDATION: Screening mammogram in one year.(Code:SM-B-01Y)  I have discussed the findings and recommendations with the patient. Results were also provided in writing at the conclusion of the visit. If applicable, a reminder letter will be sent to the patient regarding the next appointment.  BI-RADS CATEGORY  1: Negative.   Electronically Signed   By: Conchita Paris M.D.   On: 05/08/2014 15:34   IMPRESSION:  1. OSA (obstructive sleep apnea)   2. Essential hypertension   3. Hyperlipidemia with target LDL less than 70   4. Pacemaker   5. Hypertensive heart disease without heart failure   6. Dementia without behavioral disturbance, unspecified dementia type ([place])      ASSESSMENT AND PLAN: Ms.Harbeck is a 84 year old female with a history of hypertension, moderate concentric left ventricular hypertrophy with LVOT gradient at the midcavity level, mitral annular calcification and mild pulmonary hypertension.  She has previously had a history of nonsustained ventricular tachycardia which had responded to increasing beta blocker therapy reduced secondary to fatigue.  In February 2019, she developed a syncopal spell and interrogation of her loop recorder showed a period of sinus arrest/asystole for 30 seconds.  She underwent successful implantation of a pacemaker.  She has not had any recurrent symptoms of  presyncope or syncope.  When I saw her in follow-up her blood pressure was elevated and I suggested resumption of metoprolol with gradual titration upward depending upon  blood pressure response.  Blood pressure today is improved remains mildly increased increased on amlodipine 2.5 mg daily, losartan 100 mg, and Toprol-XL 100 mg daily 140/76 on recheck by me.  He has had some cognitive decline.  Again discussed the importance of using her CPAP therapy with her cardiovascular comorbidities.  She had met compliance standards from a Medicare standpoint.  However, most recently she is not compliant.  We discussed that she learn how to put the mask on by herself and not rely on others.  Since she has not been using CPAP she is not as alert during the day as she had been previously and according to her daughter often takes naps.  Denies any recent claudication symptoms with reference to her PAD.  I will discuss with her daughter [name] to obtain follow-up of improvement in CPAP utilization.  He has had more recent difficulty with hearing.  Her daughter will monitor her blood pressure at home.  Her ECG today shows she is appropriately sensing her atrium and ventricle and there was only one atrial paced beat.  I will see her in 1 year for evaluation.   Troy Sine, MD, [facility]  08/14/2018 7:37 PM

## 2018-08-14 NOTE — Patient Instructions (Signed)

## 2018-08-19 DIAGNOSIS — G4733 Obstructive sleep apnea (adult) (pediatric): Secondary | ICD-10-CM | POA: Diagnosis not present

## 2018-08-25 ENCOUNTER — Ambulatory Visit: Payer: Medicare Other | Admitting: Cardiovascular Disease

## 2018-09-01 DIAGNOSIS — H401133 Primary open-angle glaucoma, bilateral, severe stage: Secondary | ICD-10-CM | POA: Diagnosis not present

## 2018-09-19 DIAGNOSIS — G4733 Obstructive sleep apnea (adult) (pediatric): Secondary | ICD-10-CM | POA: Diagnosis not present

## 2018-10-02 ENCOUNTER — Ambulatory Visit (INDEPENDENT_AMBULATORY_CARE_PROVIDER_SITE_OTHER): Payer: Medicare Other

## 2018-10-02 DIAGNOSIS — I442 Atrioventricular block, complete: Secondary | ICD-10-CM | POA: Diagnosis not present

## 2018-10-02 NOTE — Progress Notes (Signed)
Remote pacemaker transmission.   

## 2018-10-05 ENCOUNTER — Encounter: Payer: Self-pay | Admitting: Cardiology

## 2018-10-19 DIAGNOSIS — G4733 Obstructive sleep apnea (adult) (pediatric): Secondary | ICD-10-CM | POA: Diagnosis not present

## 2018-11-02 ENCOUNTER — Telehealth: Payer: Self-pay

## 2018-11-02 ENCOUNTER — Encounter: Payer: Self-pay | Admitting: *Deleted

## 2018-11-02 DIAGNOSIS — E785 Hyperlipidemia, unspecified: Secondary | ICD-10-CM

## 2018-11-02 DIAGNOSIS — I4729 Other ventricular tachycardia: Secondary | ICD-10-CM

## 2018-11-02 DIAGNOSIS — I472 Ventricular tachycardia: Secondary | ICD-10-CM

## 2018-11-02 NOTE — Telephone Encounter (Signed)
Labs ordered and given to patient's daughter.

## 2018-11-02 NOTE — Progress Notes (Signed)
Agree with recommendations.  

## 2018-11-02 NOTE — Progress Notes (Signed)
Noted HVR episode from 11/02/18 at 12:10am, duration 10sec. Last EF 70-75% in 12/2017. EGM also placed in red folder and routed to Dr. Sallyanne Kuster for review.

## 2018-11-02 NOTE — Telephone Encounter (Signed)
Croitoru, Dani Gobble, MD  You; Donia Pounds Helane Gunther, RN; Troy Sine, MD 3 hours ago (11:49 AM)     Thanks, Raquel Sarna. Lengthy nonsustained VT.  I spoke with her daughter, [name]. No symptoms at the time. She is not being compliant with CPAP. [name], please order labs: CMET Mg Fasting lipid CBC

## 2018-11-02 NOTE — Progress Notes (Signed)
Thanks, Raquel Sarna. Lengthy nonsustained VT.  I spoke with her daughter, [name]. No symptoms at the time. She is not being compliant with CPAP. [name], please order labs: CMET Mg Fasting lipid CBC

## 2018-11-10 ENCOUNTER — Other Ambulatory Visit: Payer: Self-pay | Admitting: Cardiovascular Disease

## 2018-11-14 NOTE — Progress Notes (Signed)
Me    11/02/18 5:03 PM  Note    Labs ordered and given to patient's daughter.

## 2018-11-19 DIAGNOSIS — G4733 Obstructive sleep apnea (adult) (pediatric): Secondary | ICD-10-CM | POA: Diagnosis not present

## 2018-11-24 MED FILL — LATANOPROST 0.005% EYE DRP: 0.005 | 25 days supply | Qty: 3 | Fill #1

## 2018-11-28 LAB — CUP PACEART REMOTE DEVICE CHECK
Date Time Interrogation Session: 20200114200337
Implantable Lead Implant Date: 20190225
Implantable Lead Implant Date: 20190225
Implantable Lead Location: 753859
Implantable Lead Location: 753860
Implantable Lead Model: 377
Implantable Lead Model: 377
Implantable Lead Serial Number: 80514208
Implantable Lead Serial Number: 80598782
Implantable Pulse Generator Implant Date: 20190225
Pulse Gen Model: 407145
Pulse Gen Serial Number: 69245949

## 2018-12-05 DIAGNOSIS — H524 Presbyopia: Secondary | ICD-10-CM | POA: Diagnosis not present

## 2018-12-05 DIAGNOSIS — H401123 Primary open-angle glaucoma, left eye, severe stage: Secondary | ICD-10-CM | POA: Diagnosis not present

## 2018-12-18 ENCOUNTER — Other Ambulatory Visit: Payer: Self-pay | Admitting: *Deleted

## 2018-12-18 MED ORDER — OMEGA-3-ACID ETHYL ESTERS 1 G PO CAPS: 1.0000 g | ORAL_CAPSULE | Freq: Every evening | ORAL | 3 refills | Status: DC

## 2018-12-18 NOTE — Telephone Encounter (Signed)
Rx has been sent to the pharmacy electronically. Livalo 2 mg #90 3 refills

## 2018-12-20 DIAGNOSIS — G4733 Obstructive sleep apnea (adult) (pediatric): Secondary | ICD-10-CM | POA: Diagnosis not present

## 2018-12-21 ENCOUNTER — Other Ambulatory Visit: Payer: Self-pay | Admitting: Cardiovascular Disease

## 2019-01-01 ENCOUNTER — Ambulatory Visit (INDEPENDENT_AMBULATORY_CARE_PROVIDER_SITE_OTHER): Payer: Medicare Other

## 2019-01-01 DIAGNOSIS — I442 Atrioventricular block, complete: Secondary | ICD-10-CM | POA: Diagnosis not present

## 2019-01-01 LAB — CUP PACEART REMOTE DEVICE CHECK
Date Time Interrogation Session: 20200217193612
Implantable Lead Implant Date: 20190225
Implantable Lead Implant Date: 20190225
Implantable Lead Location: 753859
Implantable Lead Location: 753860
Implantable Lead Model: 377
Implantable Lead Model: 377
Implantable Lead Serial Number: 80514208
Implantable Lead Serial Number: 80598782
Implantable Pulse Generator Implant Date: 20190225
Pulse Gen Model: 407145
Pulse Gen Serial Number: 69245949

## 2019-01-01 MED FILL — LATANOPROST 0.005% EYE DRP: 0.005 | 25 days supply | Qty: 3 | Fill #2

## 2019-01-10 NOTE — Progress Notes (Signed)
Remote pacemaker transmission.   

## 2019-01-18 DIAGNOSIS — G4733 Obstructive sleep apnea (adult) (pediatric): Secondary | ICD-10-CM | POA: Diagnosis not present

## 2019-01-23 DIAGNOSIS — R1310 Dysphagia, unspecified: Secondary | ICD-10-CM | POA: Diagnosis not present

## 2019-01-23 DIAGNOSIS — K219 Gastro-esophageal reflux disease without esophagitis: Secondary | ICD-10-CM | POA: Diagnosis not present

## 2019-01-23 DIAGNOSIS — R1084 Generalized abdominal pain: Secondary | ICD-10-CM | POA: Diagnosis not present

## 2019-02-05 ENCOUNTER — Telehealth (INDEPENDENT_AMBULATORY_CARE_PROVIDER_SITE_OTHER): Payer: Medicare Other | Admitting: Family Medicine

## 2019-02-05 ENCOUNTER — Other Ambulatory Visit: Payer: Self-pay

## 2019-02-05 DIAGNOSIS — B9789 Other viral agents as the cause of diseases classified elsewhere: Principal | ICD-10-CM

## 2019-02-05 DIAGNOSIS — J069 Acute upper respiratory infection, unspecified: Secondary | ICD-10-CM

## 2019-02-05 NOTE — Progress Notes (Signed)
Documentation for Telephone encounter:  This telephone service is not related to other E/M service within previous 7 days.  Patient's daughter consented to the consult.  This telephone consult involved patient's daughter and myself,  Subjective: She has a 10-day history of cough and rhinorrhea but no fever, chills, shortness of breath, earache, sore throat.  She has no other concerns and is eating regularly.    Objective: No exam done as this was a phone encounter with the daughter who is her caregiver.   Assessment: Probable viral URI   Plan: Continue with present medication regimen which is mainly a Mucinex DM.    They were advised to follow up follow-up as needed especially if she gets worsening shortness of breath or coughing  Time involving medical discussion was 10 minutes.

## 2019-02-18 DIAGNOSIS — G4733 Obstructive sleep apnea (adult) (pediatric): Secondary | ICD-10-CM | POA: Diagnosis not present

## 2019-03-06 ENCOUNTER — Encounter: Payer: Self-pay | Admitting: Cardiovascular Disease

## 2019-03-06 NOTE — Progress Notes (Signed)
Very brief NSVT, previously recorded on event monitor (asymptomatic). No change in treatment.

## 2019-03-06 NOTE — Progress Notes (Signed)
Noted. Thanks.

## 2019-03-06 NOTE — Progress Notes (Signed)
agree

## 2019-03-06 NOTE — Progress Notes (Signed)
Biotronik alert for NSVT episode.  Pt with history of syncope and NSVT. Will route to Dr C for review.

## 2019-03-18 ENCOUNTER — Other Ambulatory Visit: Payer: Self-pay | Admitting: Cardiovascular Disease

## 2019-03-20 DIAGNOSIS — G4733 Obstructive sleep apnea (adult) (pediatric): Secondary | ICD-10-CM | POA: Diagnosis not present

## 2019-03-20 MED FILL — LATANOPROST 0.005% EYE DRP: 0.005 | 25 days supply | Qty: 3 | Fill #3

## 2019-04-02 ENCOUNTER — Ambulatory Visit (INDEPENDENT_AMBULATORY_CARE_PROVIDER_SITE_OTHER): Payer: Medicare Other | Admitting: *Deleted

## 2019-04-02 ENCOUNTER — Other Ambulatory Visit: Payer: Self-pay

## 2019-04-02 DIAGNOSIS — I442 Atrioventricular block, complete: Secondary | ICD-10-CM | POA: Diagnosis not present

## 2019-04-03 LAB — CUP PACEART REMOTE DEVICE CHECK
Date Time Interrogation Session: 20200519075617
Implantable Lead Implant Date: 20190225
Implantable Lead Implant Date: 20190225
Implantable Lead Location: 753859
Implantable Lead Location: 753860
Implantable Lead Model: 377
Implantable Lead Model: 377
Implantable Lead Serial Number: 80514208
Implantable Lead Serial Number: 80598782
Implantable Pulse Generator Implant Date: 20190225
Pulse Gen Model: 407145
Pulse Gen Serial Number: 69245949

## 2019-04-11 ENCOUNTER — Encounter: Payer: Self-pay | Admitting: Cardiology

## 2019-04-11 NOTE — Progress Notes (Signed)
Remote pacemaker transmission.   

## 2019-04-18 DIAGNOSIS — R1310 Dysphagia, unspecified: Secondary | ICD-10-CM | POA: Diagnosis not present

## 2019-04-18 DIAGNOSIS — R1084 Generalized abdominal pain: Secondary | ICD-10-CM | POA: Diagnosis not present

## 2019-04-18 DIAGNOSIS — K219 Gastro-esophageal reflux disease without esophagitis: Secondary | ICD-10-CM | POA: Diagnosis not present

## 2019-04-20 DIAGNOSIS — G4733 Obstructive sleep apnea (adult) (pediatric): Secondary | ICD-10-CM | POA: Diagnosis not present

## 2019-04-24 DIAGNOSIS — H401133 Primary open-angle glaucoma, bilateral, severe stage: Secondary | ICD-10-CM | POA: Diagnosis not present

## 2019-04-30 ENCOUNTER — Encounter (HOSPITAL_COMMUNITY): Payer: Self-pay

## 2019-05-07 MED FILL — LATANOPROST 0.005% EYE DRP: 0.005 | 25 days supply | Qty: 3 | Fill #4

## 2019-05-25 ENCOUNTER — Other Ambulatory Visit: Payer: Self-pay | Admitting: Cardiovascular Disease

## 2019-05-25 ENCOUNTER — Telehealth: Payer: Self-pay | Admitting: *Deleted

## 2019-05-25 NOTE — Telephone Encounter (Signed)
Rx(s) sent to pharmacy electronically.  

## 2019-05-25 NOTE — Telephone Encounter (Signed)

## 2019-05-25 NOTE — Telephone Encounter (Signed)
Left a message for the patient's daughter to call back concerning the patient's appointment on Monday with Dr. Sallyanne Kuster.

## 2019-05-28 ENCOUNTER — Telehealth (INDEPENDENT_AMBULATORY_CARE_PROVIDER_SITE_OTHER): Payer: Medicare Other | Admitting: Cardiovascular Disease

## 2019-05-28 DIAGNOSIS — I739 Peripheral vascular disease, unspecified: Secondary | ICD-10-CM | POA: Diagnosis not present

## 2019-05-28 DIAGNOSIS — E7800 Pure hypercholesterolemia, unspecified: Secondary | ICD-10-CM

## 2019-05-28 DIAGNOSIS — F0390 Unspecified dementia without behavioral disturbance: Secondary | ICD-10-CM

## 2019-05-28 DIAGNOSIS — I442 Atrioventricular block, complete: Secondary | ICD-10-CM | POA: Diagnosis not present

## 2019-05-28 DIAGNOSIS — W19XXXA Unspecified fall, initial encounter: Secondary | ICD-10-CM | POA: Diagnosis not present

## 2019-05-28 DIAGNOSIS — I472 Ventricular tachycardia: Secondary | ICD-10-CM

## 2019-05-28 DIAGNOSIS — Z950 Presence of cardiac pacemaker: Secondary | ICD-10-CM

## 2019-05-28 DIAGNOSIS — I4729 Other ventricular tachycardia: Secondary | ICD-10-CM

## 2019-05-28 DIAGNOSIS — G4733 Obstructive sleep apnea (adult) (pediatric): Secondary | ICD-10-CM

## 2019-05-28 MED ORDER — CILOSTAZOL 50 MG PO TABS: 50.0000 mg | ORAL_TABLET | Freq: Two times a day (BID) | ORAL | 3 refills | Status: DC

## 2019-05-28 NOTE — Progress Notes (Signed)
Virtual Visit via Telephone Note   This visit type was conducted due to national recommendations for restrictions regarding the COVID-19 Pandemic (e.g. social distancing) in an effort to limit this patient's exposure and mitigate transmission in our community.  Due to her co-morbid illnesses, this patient is at least at moderate risk for complications without adequate follow up.  This format is felt to be most appropriate for this patient at this time.  The patient did not have access to video technology/had technical difficulties with video requiring transitioning to audio format only (telephone).  All issues noted in this document were discussed and addressed.  No physical exam could be performed with this format.  Please refer to the patient's chart for her  consent to telehealth for [facility].   Date:  05/28/2019   ID:  Julie Bass, DOB [id number], MRN [id number]  Patient Location: Home Provider Location: Office  PCP:  Denita Lung, MD  Cardiologist:  Shelva Majestic, MD Elena Davia - pacemaker; PAD - Gwenlyn Found Electrophysiologist:  None   Evaluation Performed:  Follow-Up Visit  Chief Complaint:  PAD, pacemaker check  History of Present Illness:    Julie Bass is a 85 y.o. female with a history of syncope due to paroxysmal complete heart block, permanent pacemaker, PAD status post previous percutaneous intervention (L SFA stent), hypertension with left ventricular hypertrophy and mid cavitary gradient, hypercholesterolemia, obstructive sleep apnea, and asymptomatic nonsustained VT detected by pacemaker.  She has not had dizziness or syncope but has had a couple of mechanical falls.  Just last night she slipped off the edge of the bed when she was trying to swing her legs into the bed.  Several weeks ago she missed the chair when she went to sit down at the dinner table.  She has not had any serious injuries with either 1 of those falls.  She denies angina or dyspnea at rest  with activity, but is quite sedentary.  She has not had palpitations or lower extremity edema.  Her biggest complaint is pain in her right leg that appears to get worse when she tries to walk around the house, suggestive of intermittent claudication.  She has worsening dementia and is difficult to get detailed review of systems, but both her daughter and her grandson confirm her frequent complaints of right calf pain.  Her Biotronik Edora dual-chamber device was last checked on Apr 02, 2019.  There was a 17 beat episode of nonsustained VT that was asymptomatic and increase in PVC burden was noticed around the same time.  Lead parameters were normal.  Battery generator at 90%.  There was 73% atrial pacing and no ventricular pacing  The patient does not have symptoms concerning for COVID-19 infection (fever, chills, cough, or new shortness of breath).    Past Medical History:  Diagnosis Date  . Arthritis   . Chronic kidney disease    KIDNEY STONES  . Dementia    BEGINNING STAGES   . Depression   . GERD (gastroesophageal reflux disease)   . Glaucoma   . Hiatal hernia   . HTN (hypertension) 10/18/2011  . Hyperlipemia 10/18/2011  . NSVT (nonsustained ventricular tachycardia) ([place]) 10/18/2011  . Osteoporosis   . PAD (peripheral artery disease) ([place]) 10/18/2011   h/o left SFA stent   Past Surgical History:  Procedure Laterality Date  . ABDOMINAL HYSTERECTOMY  1973  . CARDIAC CATHETERIZATION     2012  . CHOLECYSTECTOMY    . LOOP RECORDER IMPLANT N/A  08/27/2014   Procedure: LOOP RECORDER IMPLANT;  Surgeon: Coralyn Mark, MD;  Location: [facility];  Service: Cardiovascular;  Laterality: N/A;  . LOOP RECORDER REMOVAL N/A 01/09/2018   Procedure: LOOP RECORDER REMOVAL;  Surgeon: Evans Lance, MD;  Location: [facility];  Service: Cardiovascular;  Laterality: N/A;  . LOWER EXTREMITY ANGIOGRAM  11/02/2007   stent of mid left SFA with 6x156mm EV3 self-expanding stent and 6x3 in prox region  (Dr. Adora Fridge)  . LOWER EXTREMITY ANGIOGRAM    . LOWER EXTREMITY ARTERIAL DOPPLER  2013   right SFA with 50-69% diameter reduction, right PTA/peroneal occluded, L SFA prox to stent has narrowing with increased velocities >60% diameter reduction, L SFA stent patent, L ATA w/occlusive disease, bilat ABIs show mild arterial insuffiency at rest  . NM MYOCAR PERF WALL MOTION  10/2011   non-gated - normal stuy, EF 82%, normal LV wall motion  . PACEMAKER IMPLANT N/A 01/09/2018   Procedure: PACEMAKER IMPLANT;  Surgeon: Evans Lance, MD;  Location: [facility];  Service: Cardiovascular;  Laterality: N/A;  . REVERSE SHOULDER ARTHROPLASTY Right 01/26/2013   Procedure: RIGHT REVERSE TOTAL SHOULDER ARTHROPLASTY;  Surgeon: Augustin Schooling, MD;  Location: [facility];  Service: Orthopedics;  Laterality: Right;  . TRANSTHORACIC ECHOCARDIOGRAM  10/2012   EF 75-70%, mod conc hypertrophy, severely calcified MV annulus, LA mildly dailted, PA peak pressure 60mmHg     No outpatient medications have been marked as taking for the 05/28/19 encounter (Appointment) with Terecia Plaut, Dani Gobble, MD.     Allergies:   Atorvastatin calcium, Crestor [rosuvastatin calcium], Lipitor [atorvastatin calcium], and Rosuvastatin calcium   Social History   Tobacco Use  . Smoking status: Former Smoker    Quit date: 05/07/1984    Years since quitting: 35.0  . Smokeless tobacco: Never Used  Substance Use Topics  . Alcohol use: No  . Drug use: No     Family Hx: The patient's family history includes Breast cancer in her sister; CAD in her son; Cancer in her mother; Colon cancer in her brother; Hypertension in her daughter; Pulmonary embolism in her daughter; Throat cancer in her father.  ROS:   Please see the history of present illness.    All other systems reviewed and are negative.  Prior CV studies:   The following studies were reviewed today: Pacemaker downloads February and May 2020 Lower extremity arterial Dopplers August  2018: Right ABI 0.87/right TBI 0.42, left ABI 0.89/left TBI 0.64  Labs/Other Tests and Data Reviewed:    EKG:  An ECG dated 08/14/2018 was personally reviewed today and demonstrated:  Sinus rhythm with long PR interval  Recent Labs: No results found for requested labs within last 8760 hours.   Recent Lipid Panel Lab Results  Component Value Date/Time   CHOL 156 01/08/2018 06:39 AM   TRIG 75 01/08/2018 06:39 AM   HDL 70 01/08/2018 06:39 AM   CHOLHDL 2.2 01/08/2018 06:39 AM   LDLCALC 71 01/08/2018 06:39 AM    Wt Readings from Last 3 Encounters:  08/14/18 120 lb 6.4 oz (54.6 kg)  06/05/18 118 lb (53.5 kg)  04/07/18 120 lb (54.4 kg)     Objective:    Vital Signs:  There were no vitals taken for this visit.   VITAL SIGNS:  reviewed Unable to examine  ASSESSMENT & PLAN:    1. PAD: Review of systems is difficult but she appears to be describing right calf intermittent claudication.  We will try  cilostazol 50 mg twice daily.  If well-tolerated can increase the dose to 100 mg twice daily.  Stop clopidogrel when she starts cilostazol.  Has a follow-up appointment Dr. Claiborne Billings planned for September. 2. CHB: Led to pacemaker implantation, but she almost never needs ventricular pacing.  3. PM: Normal device function.  Continue remote downloads every 3 months and yearly office visit.  4. NSVT: Asymptomatic.  Monitor.  On beta-blockers. 5. HLP :LDL cholesterol very close to target of 70. 6. OSA: She will not wear CPAP even if it has been prescribed.  7. Dementia: Slowly progressive.  Intolerant to cholinesterase inhibitors due to diarrhea.  COVID-19 Education: The signs and symptoms of COVID-19 were discussed with the patient and how to seek care for testing (follow up with PCP or arrange E-visit).  The importance of social distancing was discussed today.  Time:   Today, I have spent 15 minutes with the patient with telehealth technology discussing the above problems.     Medication  Adjustments/Labs and Tests Ordered: Current medicines are reviewed at length with the patient today.  Concerns regarding medicines are outlined above.   Tests Ordered: No orders of the defined types were placed in this encounter.   Medication Changes: No orders of the defined types were placed in this encounter.   Follow Up:  Virtual Visit or In Person 1 year  Signed, Sanda Klein, MD  05/28/2019 11:40 AM    [place]

## 2019-05-28 NOTE — Patient Instructions (Signed)
Medication Instructions:  STOP the Clopidogrel (Plavix) START Cilostazol (Pletal) 50 mg twice daily  If you need a refill on your cardiac medications before your next appointment, please call your pharmacy.   Lab work: None ordered  Testing/Procedures: None ordered  Follow-Up: At [facility], you and your health needs are our priority.  As part of our continuing mission to provide you with exceptional heart care, we have created designated Provider Care Teams.  These Care Teams include your primary Cardiologist (physician) and Advanced Practice Providers (APPs -  Physician Assistants and Nurse Practitioners) who all work together to provide you with the care you need, when you need it. You will need a follow up appointment in 12 months.  Please call our office 2 months in advance to schedule this appointment.  You may see Sanda Klein, MD or one of the following Advanced Practice Providers on your designated Care Team: [place], [place] . Fabian Sharp, PA-C

## 2019-05-31 ENCOUNTER — Other Ambulatory Visit: Payer: Self-pay

## 2019-05-31 MED ORDER — AMLODIPINE BESYLATE 2.5 MG PO TABS: 2.5000 mg | ORAL_TABLET | Freq: Every day | ORAL | 3 refills | Status: DC

## 2019-05-31 MED ORDER — METOPROLOL SUCCINATE ER 100 MG PO TB24: 100.0000 mg | ORAL_TABLET | Freq: Every day | ORAL | 3 refills | Status: DC

## 2019-06-04 MED FILL — CILOSTAZOL 50 MG TABLET: 50 | 30 days supply | Qty: 60 | Fill #0

## 2019-06-06 ENCOUNTER — Other Ambulatory Visit: Payer: Self-pay | Admitting: Cardiovascular Disease

## 2019-06-06 DIAGNOSIS — R1030 Lower abdominal pain, unspecified: Secondary | ICD-10-CM

## 2019-06-06 NOTE — Progress Notes (Signed)
Has had lower abdominal discomfort, low grade fever and frequency. Daughter will collect clean catch specimen.

## 2019-06-08 ENCOUNTER — Telehealth: Payer: Self-pay

## 2019-06-08 DIAGNOSIS — N3000 Acute cystitis without hematuria: Secondary | ICD-10-CM

## 2019-06-08 MED ORDER — LEVOFLOXACIN 500 MG PO TABS: 500.0000 mg | ORAL_TABLET | Freq: Every day | ORAL | 0 refills | Status: DC

## 2019-06-08 MED FILL — levoFLOXacin 500 MG TABS: 500 | 10 days supply | Qty: 10 | Fill #0

## 2019-06-08 NOTE — Telephone Encounter (Signed)
Her daughter states that she was having UTI symptoms.  Urine microscopic was loaded with bacteria.  I will give her Levaquin as she has a problem taking a lot of pills.

## 2019-06-08 NOTE — Telephone Encounter (Signed)
Pt U/A was loaded with bacteria . Daughter says she has a problem taking pills so as long she doesn't have to take a lot like Levaquin . Please advise [facility]

## 2019-06-10 ENCOUNTER — Other Ambulatory Visit: Payer: Self-pay | Admitting: Cardiovascular Disease

## 2019-06-11 ENCOUNTER — Other Ambulatory Visit: Payer: Self-pay

## 2019-06-11 MED ORDER — LOSARTAN POTASSIUM 100 MG PO TABS: 100.0000 mg | ORAL_TABLET | Freq: Every day | ORAL | 3 refills | Status: DC

## 2019-06-16 ENCOUNTER — Other Ambulatory Visit: Payer: Self-pay | Admitting: Cardiovascular Disease

## 2019-07-02 ENCOUNTER — Ambulatory Visit (INDEPENDENT_AMBULATORY_CARE_PROVIDER_SITE_OTHER): Payer: Medicare Other | Admitting: *Deleted

## 2019-07-02 DIAGNOSIS — I442 Atrioventricular block, complete: Secondary | ICD-10-CM

## 2019-07-04 LAB — CUP PACEART REMOTE DEVICE CHECK
Date Time Interrogation Session: 20200819093631
Implantable Lead Implant Date: 20190225
Implantable Lead Implant Date: 20190225
Implantable Lead Location: 753859
Implantable Lead Location: 753860
Implantable Lead Model: 377
Implantable Lead Model: 377
Implantable Lead Serial Number: 80514208
Implantable Lead Serial Number: 80598782
Implantable Pulse Generator Implant Date: 20190225
Pulse Gen Model: 407145
Pulse Gen Serial Number: 69245949

## 2019-07-09 MED FILL — CILOSTAZOL 50 MG TABLET: 50 | 30 days supply | Qty: 60 | Fill #1

## 2019-07-10 MED FILL — LATANOPROST 0.005% EYE DRP: 0.005 | 75 days supply | Qty: 8 | Fill #0

## 2019-07-11 ENCOUNTER — Encounter: Payer: Self-pay | Admitting: Cardiology

## 2019-07-11 NOTE — Progress Notes (Signed)
Remote pacemaker transmission.   

## 2019-07-19 DIAGNOSIS — H401133 Primary open-angle glaucoma, bilateral, severe stage: Secondary | ICD-10-CM | POA: Diagnosis not present

## 2019-08-06 ENCOUNTER — Telehealth (INDEPENDENT_AMBULATORY_CARE_PROVIDER_SITE_OTHER): Payer: Medicare Other | Admitting: Cardiovascular Disease

## 2019-08-06 DIAGNOSIS — I119 Hypertensive heart disease without heart failure: Secondary | ICD-10-CM | POA: Diagnosis not present

## 2019-08-06 DIAGNOSIS — I739 Peripheral vascular disease, unspecified: Secondary | ICD-10-CM | POA: Diagnosis not present

## 2019-08-06 DIAGNOSIS — Z79899 Other long term (current) drug therapy: Secondary | ICD-10-CM

## 2019-08-06 DIAGNOSIS — I10 Essential (primary) hypertension: Secondary | ICD-10-CM | POA: Diagnosis not present

## 2019-08-06 DIAGNOSIS — G4733 Obstructive sleep apnea (adult) (pediatric): Secondary | ICD-10-CM

## 2019-08-06 DIAGNOSIS — Z950 Presence of cardiac pacemaker: Secondary | ICD-10-CM | POA: Diagnosis not present

## 2019-08-06 MED ORDER — LIVALO 2 MG PO TABS: 1.0000 | ORAL_TABLET | Freq: Every day | ORAL | 1 refills | Status: DC

## 2019-08-06 MED ORDER — CILOSTAZOL 50 MG PO TABS: 50.0000 mg | ORAL_TABLET | Freq: Two times a day (BID) | ORAL | 2 refills | Status: DC

## 2019-08-06 NOTE — Progress Notes (Signed)
Virtual Visit via Telephone Note   This visit type was conducted due to national recommendations for restrictions regarding the COVID-19 Pandemic (e.g. social distancing) in an effort to limit this patient's exposure and mitigate transmission in our community.  Due to her co-morbid illnesses, this patient is at least at moderate risk for complications without adequate follow up.  This format is felt to be most appropriate for this patient at this time.  The patient did not have access to video technology/had technical difficulties with video requiring transitioning to audio format only (telephone).  All issues noted in this document were discussed and addressed.  No physical exam could be performed with this format.  Please refer to the patient's chart for her  consent to telehealth for [facility].   Date:  08/06/2019   ID:  Julie Bass, DOB [birth year], MRN [id number]  Patient Location: Home Provider Location: Office  PCP:  Denita Lung, MD  Cardiologist:  Shelva Majestic, MD  Electrophysiologist:  None   Evaluation Performed:  Follow-Up Visit  Chief Complaint:  12 month F/U   History of Present Illness:    Julie Bass is a 85 y.o. female who has a history of hypertension, hyperlipidemia, peripheral vascular disease, as well as episodes of nonsustained ventricular tachycardia, for, which she's been treated with beta blocker therapy.  An echo Doppler study in December 2013 showed hyperdynamic LV function with an ejection fraction of at least 32%, grade 1 diastolic dysfunction, and dynamic obstruction in the mid cavity of the left ventricle with a gradient of 10 mm.  She had severely calcified mitral annulus and mild left atrial dilatation.  She has documented nonsustained ventricular tachycardia, which was picked up on a remote event monitor, and she has tolerated titration of her beta blocker therapy.  She also has had difficulty with statin intolerance for hyperlipidemia, but  has tolerated livalo 2 mg daily.  She also has history of hypertension and has been on losartan 100 mg in addition to her metoprolol.  She has peripheral vascular disease and is status post stenting of her left SFA in the past.  She has one vessel runoff bilaterally and has moderate right SFA disease.  Her last LE Doppler study was in August 2013 , which showed occluded right PTA and peroneal occluded left PTA, left ATA with reconstitution of the dorsalis pedis artery.  ABIs were 0.88 on the right and 0.86 on the left.  She has been on aspirin 81 mg, and Plavix 75 mg   A 2 year followup echo Doppler study on 05/15/2014 showed an ejection fraction of 65-70% and there was grade 1 diastolic dysfunction.  There was moderate mitral annular calcification.  There was no mention of any dynamic obstruction.  Lower extremity Doppler studies revealed a patent left SFA stent with prior disease noted in several vessels, not significantly changed from 2013.  She was hospitalized on 08/23/2014 after developing a syncopal spell.  She was not significantly orthostatic to her hospitalization.  Upon presentation her blood pressure was 170/58.  Her ECG showed sinus rhythm with mild ST changes laterally, and first degree heart block.  She was noted to have a single 15 beat run of nonsustained VT.  She ultimately underwent implantation of a loop recorder on 08/27/2014.  She underwent carotid duplex imaging and findings suggest 1-39% internal carotid artery stenosis bilaterally. The left vertebral artery is patent with antegrade flow but the the right vertebral artery was not able to be  visualized.  After discharge, the loop recorder detected 2 episodes of questionable transient asystole.  Upon review of these tracings with Dr. Sallyanne Kuster it appears that these may very well be artifactual.  One episode was recorded on October 14, and another on October 16.  The episode in October 16 tach, rate of less than over 30 seconds.  The  patient was with her daughter, who is my nurse at the time.  She denies any symptoms at this time.  Specifically, she denied any lightheadedness, and one would expect that with a >30 second pause she would have been significantly symptomatic and this would have also been noted by her daughter.  She denies any major episodes of dizziness but if she gets up quickly or lies down fast.  A CT of her abdomen showed atherosclerotic plaque throughout her abdominal aorta with suggestion of a tiny 0.5 x 0.2 cm atherosclerotic ulcer.  She was also noted to have plaque that was irregular throughout the right superficial femoral artery and also plaque in the popliteal artery, not resulting in hemodynamically significance stenosis.  She had single-vessel runoff to the right lower leg and foot.  The right anterior tibial artery with atretic reconstitution of both posterior tibial and peroneal vascular distributions.  The right dorsalis pedis artery was patent to the level of the forefoot.  She had stents noted in the distal aspect of her left common femoral artery and single vessel runoff to the left lower leg and foot.  There was occlusion of both the left anterior, posterior tibial arteries.  The peroneal artery was found to reconstitute a dorsalis pedis artery at the level of the hindfoot.  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    She has underone neurologic evaluation with Dr. Corwin Levins and is felt to have moderate  dementia felt to be Alzheimer's  versus vascular dementia.  A CT scan of her head revealed mild cortical atrophy and moderately advanced small vessel ischemic changes involving the periventricular white matter with remote infarction in the right frontal lobe.   In October 2017, she was found to have an episode of irregular nonsustained wide complex tachycardia with a maximum rate at 167 bpm and ultimately  reverted back to sinus rhythm.  She was completely asymptomatic and this occurred while she was in bed.  At that time, her metoprolol dose, which had been 75 Mill grams twice a day was increased to 100 mg in the morning and 75 mg at night.  She has continued to take losartan 100 mg daily as well as amlodipine 5 mg for blood pressure control.  She has felt well on the increased beta blocker regimen.  She has a history of hyperlipidemia and has been able to tolerate Livalo but could not tolerate other statins.  Her current Livalo dose is  mg daily and she also takes Zetia 10 mg.  She is on gabapentin for neuropathy.  She also is on Celexa.  She underwent an echo Doppler study yesterday, 10/13/2016 which showed an EF of 55-60%.  There was mild LVH.  There was grade 2 diastolic dysfunction with abnormal tissue Doppler suggesting high ventricular filling pressures.  Her aortic valve mobility was restricted but she did not have stenosis.  There was mitral annular calcification.  There is mild left atrial dilatation.  There was mild increased pulmonary pressure.    She has had issues with short-term memory loss.  When  I last saw her in May 2018 she denied any episodes of chest pain or shortness of breath.  She was fatigued and was sleeping a lot.  She denied any episodes of tachycardia, presyncope or syncope.    A loop recorder assessment from 01/15/2017 through 03/15/2017 did not show any episodes of atrial fibrillation, tachycardia, bradycardia, pauses.  Laboratory was done 2 days ago.  TSH 2.33.  Hemoglobin 14.9, hematocrit 45.7.  BUN 18, creatinine 1.24.  LFTs were normal.  On Livalo  cholesterol 186, HDL 79, triglycerides 85, and LDL 90.    She developed an episode of syncope in February 2019 and was found to have complete heart block with a prolonged sinus pause for close to 30 seconds.  She was seen by Dr. Lovena Le during her hospitalization and underwent insertion of a permanent pacemaker [employer] Endora 8  DR-T.  Echo Doppler study on January 10, 2018 showed an EF of 70 to 75%.  There was dynamic obstruction at the mid cavity level with a peak velocity of 300 cm/s with peak gradient of 36 mm.  There was grade 1 diastolic dysfunction.  There was moderately severe mitral annular calcification.  She was subsequently seen in the office by Dr. Sallyanne Kuster who follows her pacemaker.  Remotely, she had previously experienced episodes of nonsustained VT.  Her beta-blocker had been held prior to insertion of her pacemaker after she presented with her prolonged pause and there was plans to later potentially reinitiate therapy.  There is also been concern in the past of probable obstructive sleep apnea but the patient had still never been tested.  She has seen Dr. Sallyanne Kuster in follow-up of an emergency room evaluation in May when she presented with generalized weakness to the emergency room.  Her pacemaker was interrogated and there was no signs of arrhythmia.  She did not have any associated chest pain.  She was ultimately referred for a home sleep study due to significant concerns for obstructive sleep apnea.  She was found to have severe obstructive sleep apnea with an AHI 51.8/h.  Events were worse with supine position with an AHI 53.6/h.  Oxygen desaturated to 87%.  Her CPAP set up date was May 19, 2018 with [facility] [employer] is her DME company.  An initial download obtained in the office for the last 4 weeks today to the office today which showed very poor compliance with only 5 out of 17 usage days and average usage at 3 hours and 12 minutes.  According to the patient's daughter, the patient is very tired.  Her sleep is nonrestorative.  She snores.  She has no energy.    When I saw her in July 2019 I discussed the importance of initiating CPAP with compliance.  Her grandson was putting the mask on for proper fitting. At her last office visit in September 2019 a new download was obtained from July 21 through July 03, 2018 and she met compliance with 80% of usage days and usage greater than 4 hours at 70%.  She was averaging 7 hours and 58 minutes per night.  Her AHI was 3.7 on CPAP auto with a 95th percentile pressure at 10.5.  She had occasional moderate mask leak.    Recently, she often has not been using her CPAP since she often waits for her grandson to come home late to put it on and then at times she does not remember how to put the mask on herself.  As result,  a recent download from August 31 through September 29 showed very poor compliance.  Usage days was only 17%.  She is unaware of any episodes of presyncope or syncope.  She denies any episodes of chest pain.  She is unaware of palpitations.  She continues to be on aspirin and Plavix.  She continues to tolerate Livalo 2 mg in addition to Zetia for hyperlipidemia.  She is on losartan 100 mg, Toprol-XL 100 mg in addition to amlodipine 2.5 mg for hypertension.    Since I last saw her, she was evaluated in a telemedicine evaluation by Dr. Sallyanne Kuster in July 2020.  Due to right calf intermittent claudication Pletal 50 mg twice a day was added to her regimen.  He was felt to have normal device function on her pacemaker.  Presently, she has good days and bad days.  She felt that she did not feel too well today.  She continues to experience some leg weakness.  She has not had recent laboratory.  She is unaware of any palpitations.  According to her grandson there is no swelling in her legs.  There is no wheezing.  He has been helping her apply her CPAP mask and she intermittently uses therapy.  She has not been walking much.  She denies chest pain PND or orthopnea.  She has continued to take Zetia and Livalo for hyperlipidemia.  She presents for evaluation.  The patient does not have symptoms concerning for COVID-19 infection (fever, chills, cough, or new shortness of breath).    Past Medical History:  Diagnosis Date  . Arthritis   . Chronic kidney disease     KIDNEY STONES  . Dementia    BEGINNING STAGES   . Depression   . GERD (gastroesophageal reflux disease)   . Glaucoma   . Hiatal hernia   . HTN (hypertension) 10/18/2011  . Hyperlipemia 10/18/2011  . NSVT (nonsustained ventricular tachycardia) ([place]) 10/18/2011  . Osteoporosis   . PAD (peripheral artery disease) ([place]) 10/18/2011   h/o left SFA stent   Past Surgical History:  Procedure Laterality Date  . ABDOMINAL HYSTERECTOMY  1973  . CARDIAC CATHETERIZATION     2012  . CHOLECYSTECTOMY    . LOOP RECORDER IMPLANT N/A 08/27/2014   Procedure: LOOP RECORDER IMPLANT;  Surgeon: Coralyn Mark, MD;  Location: [place] [facility];  Service: Cardiovascular;  Laterality: N/A;  . LOOP RECORDER REMOVAL N/A 01/09/2018   Procedure: LOOP RECORDER REMOVAL;  Surgeon: Evans Lance, MD;  Location: [place] [facility];  Service: Cardiovascular;  Laterality: N/A;  . LOWER EXTREMITY ANGIOGRAM  11/02/2007   stent of mid left SFA with 6x129m EV3 self-expanding stent and 6x3 in prox region (Dr. JAdora Fridge  . LOWER EXTREMITY ANGIOGRAM    . LOWER EXTREMITY ARTERIAL DOPPLER  2013   right SFA with 50-69% diameter reduction, right PTA/peroneal occluded, L SFA prox to stent has narrowing with increased velocities >60% diameter reduction, L SFA stent patent, L ATA w/occlusive disease, bilat ABIs show mild arterial insuffiency at rest  . NM MYOCAR PERF WALL MOTION  10/2011   non-gated - normal stuy, EF 82%, normal LV wall motion  . PACEMAKER IMPLANT N/A 01/09/2018   Procedure: PACEMAKER IMPLANT;  Surgeon: TEvans Lance MD;  [facility];  Service: Cardiovascular;  Laterality: N/A;  . REVERSE SHOULDER ARTHROPLASTY Right 01/26/2013   Procedure: RIGHT REVERSE TOTAL SHOULDER ARTHROPLASTY;  Surgeon: SAugustin Schooling MD;  [place] [facility]  Service: [place]opedics;  Laterality:  Right;  Marland Kitchen TRANSTHORACIC ECHOCARDIOGRAM  10/2012   EF 75-70%, mod conc hypertrophy, severely calcified MV annulus, LA mildly dailted, PA peak  pressure 38mHg     Current Meds  Medication Sig  . amLODipine (NORVASC) 2.5 MG tablet Take 1 tablet (2.5 mg total) by mouth daily.  . cilostazol (PLETAL) 50 MG tablet Take 1 tablet (50 mg total) by mouth 2 (two) times daily.  . citalopram (CELEXA) 10 MG tablet TAKE 1 TABLET BY MOUTH EVERY DAY  . ezetimibe (ZETIA) 10 MG tablet Take 1 tablet (10 mg total) by mouth daily.  . feeding supplement, ENSURE COMPLETE, (ENSURE COMPLETE) LIQD Take 237 mLs by mouth 3 (three) times daily between meals.  .Marland Kitchengeriatric multivitamins-minerals (ELDERTONIC/GEVRABON) LIQD Take 15 mLs by mouth daily.  .Marland Kitchenlevofloxacin (LEVAQUIN) 500 MG tablet Take 1 tablet (500 mg total) by mouth daily.  .Marland Kitchenlosartan (COZAAR) 100 MG tablet Take 1 tablet (100 mg total) by mouth daily.  . metoprolol succinate (TOPROL-XL) 100 MG 24 hr tablet Take 1 tablet (100 mg total) by mouth daily. Take with or immediately following a meal.  . Multiple Vitamins-Minerals (SENIOR MULTIVITAMIN PLUS PO) Take 1 tablet by mouth daily.  .Marland Kitchenomega-3 acid ethyl esters (LOVAZA) 1 g capsule Take 1 capsule (1 g total) by mouth every evening.  . pantoprazole (PROTONIX) 40 MG tablet Take 1 tablet (40 mg total) by mouth daily.  . Pitavastatin Calcium (LIVALO) 2 MG TABS Take 1 tablet (2 mg total) by mouth daily.  . timolol (TIMOPTIC) 0.5 % ophthalmic solution Place 1 drop into both eyes 2 (two) times daily.      Allergies:   Atorvastatin calcium, Crestor [rosuvastatin calcium], Lipitor [atorvastatin calcium], and Rosuvastatin calcium   Social History   Tobacco Use  . Smoking status: Former Smoker    Quit date: 05/07/1984    Years since quitting: 35.2  . Smokeless tobacco: Never Used  Substance Use Topics  . Alcohol use: No  . Drug use: No     Family Hx: The patient's family history includes Breast cancer in her sister; CAD in her son; Cancer in her mother; Colon cancer in her brother; Hypertension in her daughter; Pulmonary embolism in her daughter; Throat  cancer in her father.  ROS:   Please see the history of present illness.    No fever chills or night sweats. Fatigue No chest pain No awareness of palpitations Intermittent right leg discomfort Positive dementia She denies leg swelling Intermittent CPAP use All other systems reviewed and are negative.   Prior CV studies:   The following studies were reviewed today:  I reviewed her most recent evaluation with Dr. CSallyanne Kuster  Labs/Other Tests and Data Reviewed:    EKG: ECG was not adequately done today in this telemedicine visit.  However I reviewed her most recent ECG in epic from August 14, 2018 which showed sinus rhythm at 69 with PAC and first-degree AV block.  Recent Labs: No results found for requested labs within last 8760 hours.   Recent Lipid Panel Lab Results  Component Value Date/Time   CHOL 156 01/08/2018 06:39 AM   TRIG 75 01/08/2018 06:39 AM   HDL 70 01/08/2018 06:39 AM   CHOLHDL 2.2 01/08/2018 06:39 AM   LDLCALC 71 01/08/2018 06:39 AM    Wt Readings from Last 3 Encounters:  08/06/19 116 lb (52.6 kg)  08/14/18 120 lb 6.4 oz (54.6 kg)  06/05/18 118 lb (53.5 kg)     Objective:    Vital Signs:  BP 132/64   Pulse (!) 58   Ht _0  (1.626 m)   Wt 116 lb (52.6 kg)   BMI 19.91 kg/m    This this was a virtual visit I could not perform a physical exam.  However, she did not have any audible wheezing.  Her breathing was not labored. He denied any chest discomfort to palpation.  She denied any leg swelling.  Admitted to intermittent right leg weakness.  ASSESSMENT & PLAN:    1. Essential hypertension: Blood pressure today is stable at 132/64 on her regimen consisting of amlodipine 2.5 mg, losartan 100 mg, Toprol-XL 100 mg daily. 2. OSA: She is only intermittently using therapy.  Compliance is improved through the efforts of her grandson Braulio Conte.  She has issues putting the mask on herself. 3. Moderate concentric left ventricular hypertrophy with LVOT  gradient at the mid cavitary level.  Normal LV function on last echo in 2017 with grade 2 diastolic dysfunction. 4. Status post pacemaker: Followed by Dr. Sallyanne Kuster 5. Hyperlipidemia: Intolerant to other statins but can take Livalo and continues to tolerate 2 mg daily along with Zetia 10 mg 6. PVD: Cilostazol added at her most recent telemedicine evaluation Dr. Sallyanne Kuster. 7. Mild dementia: Slowly progressive, intolerant to cholinesterase inhibitors.    COVID-19 Education: The signs and symptoms of COVID-19 were discussed with the patient and how to seek care for testing (follow up with PCP or arrange E-visit).  The importance of social distancing was discussed today.  Time:   Today, I have spent 18 minutes with the patient with telehealth technology discussing the above problems.     Medication Adjustments/Labs and Tests Ordered: Current medicines are reviewed at length with the patient today.  Concerns regarding medicines are outlined above.   Tests Ordered: No orders of the defined types were placed in this encounter.   Medication Changes: Meds ordered this encounter  Medications  . cilostazol (PLETAL) 50 MG tablet    Sig: Take 1 tablet (50 mg total) by mouth 2 (two) times daily.    Dispense:  180 tablet    Refill:  2  . Pitavastatin Calcium (LIVALO) 2 MG TABS    Sig: Take 1 tablet (2 mg total) by mouth daily.    Dispense:  90 tablet    Refill:  1    Follow Up: Fasting laboratory with C met, CBC, TSH and lipid panel will be done.  She will see Dr. Jeannine Boga for pacemaker follow-up.  I will see her in 1 year for reevaluation.  Signed, Shelva Majestic, MD  08/06/2019 4:28 PM    [place]

## 2019-08-06 NOTE — Patient Instructions (Addendum)
Medication Instructions:  The current medical regimen is effective;  continue present plan and medications as directed. Please refer to the Current Medication list given to you today. If you need a refill on your cardiac medications before your next appointment, please call your pharmacy.  Labwork: CMET, CBC, TSH AND FASTING LIPID HERE IN OUR OFFICE AT [facility]    You will need to fast. DO NOT EAT OR DRINK PAST MIDNIGHT.       Take the provided lab slips with you to the lab for your blood draw.   When you have your labs (blood work) drawn today and your tests are completely normal, you will receive your results only by MyChart Message (if you have MyChart) -OR-  A paper copy in the mail.  If you have any lab test that is abnormal or we need to change your treatment, we will call you to review these results.  Follow-Up: You will need a follow up appointment WITH DR CROITORU.  Please call our office 2 months in advance, MAY 2021 to schedule this, JULY 2021 appointment.  You will need a follow up appointment in 12 months.  Please call our office 2 months in advance, July 2021 to schedule this, September 2021 appointment.  You may see Shelva Majestic, MD or one of the following Advanced Practice Providers on your designated Care Team:  Almyra Deforest, PA-C  Fabian Sharp, PA-C     At [facility], you and your health needs are our priority.  As part of our continuing mission to provide you with exceptional heart care, we have created designated Provider Care Teams.  These Care Teams include your primary Cardiologist (physician) and Advanced Practice Providers (APPs -  Physician Assistants and Nurse Practitioners) who all work together to provide you with the care you need, when you need it.  Thank you for choosing [facility] at [facility]!!

## 2019-08-08 ENCOUNTER — Other Ambulatory Visit: Payer: Self-pay

## 2019-08-08 DIAGNOSIS — Z79899 Other long term (current) drug therapy: Secondary | ICD-10-CM | POA: Diagnosis not present

## 2019-08-08 DIAGNOSIS — R1030 Lower abdominal pain, unspecified: Secondary | ICD-10-CM

## 2019-08-08 DIAGNOSIS — I10 Essential (primary) hypertension: Secondary | ICD-10-CM | POA: Diagnosis not present

## 2019-08-09 LAB — CBC
Hematocrit: 47.2 % — ABNORMAL HIGH (ref 34.0–46.6)
Hemoglobin: 15.3 g/dL (ref 11.1–15.9)
MCH: 31.4 pg (ref 26.6–33.0)
MCHC: 32.4 g/dL (ref 31.5–35.7)
MCV: 97 fL (ref 79–97)
Platelets: 177 10*3/uL (ref 150–450)
RBC: 4.88 x10E6/uL (ref 3.77–5.28)
RDW: 12.8 % (ref 11.7–15.4)
WBC: 7 10*3/uL (ref 3.4–10.8)

## 2019-08-09 LAB — COMPREHENSIVE METABOLIC PANEL
ALT: 22 IU/L (ref 0–32)
AST: 25 IU/L (ref 0–40)
Albumin/Globulin Ratio: 1.8 (ref 1.2–2.2)
Albumin: 4.6 g/dL (ref 3.5–4.6)
Alkaline Phosphatase: 56 IU/L (ref 39–117)
BUN/Creatinine Ratio: 11 — ABNORMAL LOW (ref 12–28)
BUN: 13 mg/dL (ref 10–36)
Bilirubin Total: 1.2 mg/dL (ref 0.0–1.2)
CO2: 23 mmol/L (ref 20–29)
Calcium: 10.2 mg/dL (ref 8.7–10.3)
Chloride: 103 mmol/L (ref 96–106)
Creatinine, Ser: 1.2 mg/dL — ABNORMAL HIGH (ref 0.57–1.00)
GFR calc Af Amer: 46 mL/min/{1.73_m2} — ABNORMAL LOW (ref >59)
GFR calc non Af Amer: 40 mL/min/{1.73_m2} — ABNORMAL LOW (ref >59)
Globulin, Total: 2.5 g/dL (ref 1.5–4.5)
Glucose: 85 mg/dL (ref 65–99)
Potassium: 4.5 mmol/L (ref 3.5–5.2)
Sodium: 140 mmol/L (ref 134–144)
Total Protein: 7.1 g/dL (ref 6.0–8.5)

## 2019-08-09 LAB — LIPID PANEL
Chol/HDL Ratio: 1.8 ratio (ref 0.0–4.4)
Cholesterol, Total: 166 mg/dL (ref 100–199)
HDL: 91 mg/dL (ref >39)
LDL Chol Calc (NIH): 59 mg/dL (ref 0–99)
Triglycerides: 91 mg/dL (ref 0–149)
VLDL Cholesterol Cal: 16 mg/dL (ref 5–40)

## 2019-08-09 LAB — TSH: TSH: 4.18 u[IU]/mL (ref 0.450–4.500)

## 2019-08-13 ENCOUNTER — Encounter: Payer: Self-pay | Admitting: *Deleted

## 2019-08-15 ENCOUNTER — Encounter: Payer: Self-pay | Admitting: Cardiovascular Disease

## 2019-08-21 ENCOUNTER — Other Ambulatory Visit: Payer: Self-pay | Admitting: Cardiovascular Disease

## 2019-08-30 DIAGNOSIS — H401133 Primary open-angle glaucoma, bilateral, severe stage: Secondary | ICD-10-CM | POA: Diagnosis not present

## 2019-09-09 ENCOUNTER — Other Ambulatory Visit: Payer: Self-pay | Admitting: Family Medicine

## 2019-09-09 MED ORDER — SULFAMETHOXAZOLE-TRIMETHOPRIM 800-160 MG PO TABS: 1.0000 | ORAL_TABLET | Freq: Two times a day (BID) | ORAL | 0 refills | Status: DC

## 2019-09-09 NOTE — Progress Notes (Signed)
She Is again having uti symptoms so will give Septra

## 2019-09-16 ENCOUNTER — Other Ambulatory Visit: Payer: Self-pay

## 2019-09-16 ENCOUNTER — Encounter (HOSPITAL_COMMUNITY): Payer: Self-pay | Admitting: *Deleted

## 2019-09-16 ENCOUNTER — Emergency Department (HOSPITAL_COMMUNITY): Payer: Medicare Other

## 2019-09-16 ENCOUNTER — Other Ambulatory Visit: Payer: Self-pay | Admitting: Cardiovascular Disease

## 2019-09-16 ENCOUNTER — Inpatient Hospital Stay (HOSPITAL_COMMUNITY)
Admission: EM | Admit: 2019-09-16 | Discharge: 2019-09-18 | DRG: 682 | Disposition: A | Discharge status: Home Health | Payer: Medicare Other | Attending: Internal Medicine | Admitting: Internal Medicine

## 2019-09-16 DIAGNOSIS — Z743 Need for continuous supervision: Secondary | ICD-10-CM | POA: Diagnosis not present

## 2019-09-16 DIAGNOSIS — Z96611 Presence of right artificial shoulder joint: Secondary | ICD-10-CM | POA: Diagnosis present

## 2019-09-16 DIAGNOSIS — I129 Hypertensive chronic kidney disease with stage 1 through stage 4 chronic kidney disease, or unspecified chronic kidney disease: Secondary | ICD-10-CM | POA: Diagnosis not present

## 2019-09-16 DIAGNOSIS — Z8049 Family history of malignant neoplasm of other genital organs: Secondary | ICD-10-CM

## 2019-09-16 DIAGNOSIS — R531 Weakness: Secondary | ICD-10-CM

## 2019-09-16 DIAGNOSIS — I472 Ventricular tachycardia: Secondary | ICD-10-CM | POA: Diagnosis not present

## 2019-09-16 DIAGNOSIS — Z87440 Personal history of urinary (tract) infections: Secondary | ICD-10-CM | POA: Diagnosis not present

## 2019-09-16 DIAGNOSIS — R197 Diarrhea, unspecified: Secondary | ICD-10-CM | POA: Diagnosis present

## 2019-09-16 DIAGNOSIS — I471 Supraventricular tachycardia: Secondary | ICD-10-CM | POA: Diagnosis not present

## 2019-09-16 DIAGNOSIS — M1990 Unspecified osteoarthritis, unspecified site: Secondary | ICD-10-CM | POA: Diagnosis present

## 2019-09-16 DIAGNOSIS — K449 Diaphragmatic hernia without obstruction or gangrene: Secondary | ICD-10-CM | POA: Diagnosis not present

## 2019-09-16 DIAGNOSIS — K219 Gastro-esophageal reflux disease without esophagitis: Secondary | ICD-10-CM | POA: Diagnosis not present

## 2019-09-16 DIAGNOSIS — R404 Transient alteration of awareness: Secondary | ICD-10-CM | POA: Diagnosis not present

## 2019-09-16 DIAGNOSIS — R509 Fever, unspecified: Secondary | ICD-10-CM | POA: Diagnosis not present

## 2019-09-16 DIAGNOSIS — Z79899 Other long term (current) drug therapy: Secondary | ICD-10-CM

## 2019-09-16 DIAGNOSIS — I10 Essential (primary) hypertension: Secondary | ICD-10-CM | POA: Diagnosis present

## 2019-09-16 DIAGNOSIS — R5081 Fever presenting with conditions classified elsewhere: Secondary | ICD-10-CM | POA: Diagnosis not present

## 2019-09-16 DIAGNOSIS — E785 Hyperlipidemia, unspecified: Secondary | ICD-10-CM | POA: Diagnosis not present

## 2019-09-16 DIAGNOSIS — R221 Localized swelling, mass and lump, neck: Secondary | ICD-10-CM | POA: Diagnosis present

## 2019-09-16 DIAGNOSIS — E860 Dehydration: Secondary | ICD-10-CM | POA: Diagnosis present

## 2019-09-16 DIAGNOSIS — N183 Chronic kidney disease, stage 3 unspecified: Secondary | ICD-10-CM | POA: Diagnosis not present

## 2019-09-16 DIAGNOSIS — U071 COVID-19: Secondary | ICD-10-CM | POA: Diagnosis not present

## 2019-09-16 DIAGNOSIS — I4729 Other ventricular tachycardia: Secondary | ICD-10-CM

## 2019-09-16 DIAGNOSIS — N281 Cyst of kidney, acquired: Secondary | ICD-10-CM | POA: Diagnosis present

## 2019-09-16 DIAGNOSIS — I739 Peripheral vascular disease, unspecified: Secondary | ICD-10-CM | POA: Diagnosis present

## 2019-09-16 DIAGNOSIS — Z800 Family history of malignant neoplasm of digestive organs: Secondary | ICD-10-CM

## 2019-09-16 DIAGNOSIS — N1831 Chronic kidney disease, stage 3a: Secondary | ICD-10-CM | POA: Diagnosis present

## 2019-09-16 DIAGNOSIS — H409 Unspecified glaucoma: Secondary | ICD-10-CM | POA: Diagnosis not present

## 2019-09-16 DIAGNOSIS — Z888 Allergy status to other drugs, medicaments and biological substances status: Secondary | ICD-10-CM

## 2019-09-16 DIAGNOSIS — Z87891 Personal history of nicotine dependence: Secondary | ICD-10-CM

## 2019-09-16 DIAGNOSIS — Z808 Family history of malignant neoplasm of other organs or systems: Secondary | ICD-10-CM | POA: Diagnosis not present

## 2019-09-16 DIAGNOSIS — G4733 Obstructive sleep apnea (adult) (pediatric): Secondary | ICD-10-CM | POA: Diagnosis present

## 2019-09-16 DIAGNOSIS — F0390 Unspecified dementia without behavioral disturbance: Secondary | ICD-10-CM | POA: Diagnosis present

## 2019-09-16 DIAGNOSIS — N179 Acute kidney failure, unspecified: Principal | ICD-10-CM | POA: Diagnosis present

## 2019-09-16 DIAGNOSIS — R5381 Other malaise: Secondary | ICD-10-CM | POA: Diagnosis not present

## 2019-09-16 DIAGNOSIS — F0150 Vascular dementia without behavioral disturbance: Secondary | ICD-10-CM | POA: Diagnosis not present

## 2019-09-16 DIAGNOSIS — E7800 Pure hypercholesterolemia, unspecified: Secondary | ICD-10-CM | POA: Diagnosis not present

## 2019-09-16 DIAGNOSIS — Z8249 Family history of ischemic heart disease and other diseases of the circulatory system: Secondary | ICD-10-CM

## 2019-09-16 DIAGNOSIS — M810 Age-related osteoporosis without current pathological fracture: Secondary | ICD-10-CM | POA: Diagnosis not present

## 2019-09-16 DIAGNOSIS — F329 Major depressive disorder, single episode, unspecified: Secondary | ICD-10-CM | POA: Diagnosis present

## 2019-09-16 DIAGNOSIS — W19XXXA Unspecified fall, initial encounter: Secondary | ICD-10-CM | POA: Diagnosis not present

## 2019-09-16 DIAGNOSIS — Z803 Family history of malignant neoplasm of breast: Secondary | ICD-10-CM

## 2019-09-16 LAB — COMPREHENSIVE METABOLIC PANEL
ALT: 20 U/L (ref 0–44)
AST: 30 U/L (ref 15–41)
Albumin: 4 g/dL (ref 3.5–5.0)
Alkaline Phosphatase: 42 U/L (ref 38–126)
Anion gap: 11 (ref 5–15)
BUN: 22 mg/dL (ref 8–23)
CO2: 20 mmol/L — ABNORMAL LOW (ref 22–32)
Calcium: 10.4 mg/dL — ABNORMAL HIGH (ref 8.9–10.3)
Chloride: 106 mmol/L (ref 98–111)
Creatinine, Ser: 1.7 mg/dL — ABNORMAL HIGH (ref 0.44–1.00)
GFR calc Af Amer: 30 mL/min — ABNORMAL LOW (ref >60)
GFR calc non Af Amer: 26 mL/min — ABNORMAL LOW (ref >60)
Glucose, Bld: 109 mg/dL — ABNORMAL HIGH (ref 70–99)
Potassium: 4.6 mmol/L (ref 3.5–5.1)
Sodium: 137 mmol/L (ref 135–145)
Total Bilirubin: 1.1 mg/dL (ref 0.3–1.2)
Total Protein: 7.3 g/dL (ref 6.5–8.1)

## 2019-09-16 LAB — LACTIC ACID, PLASMA: Lactic Acid, Venous: 1.3 mmol/L (ref 0.5–1.9)

## 2019-09-16 LAB — CBC WITH DIFFERENTIAL/PLATELET
Abs Immature Granulocytes: 0.01 10*3/uL (ref 0.00–0.07)
Basophils Absolute: 0 10*3/uL (ref 0.0–0.1)
Basophils Relative: 0 %
Eosinophils Absolute: 0 10*3/uL (ref 0.0–0.5)
Eosinophils Relative: 1 %
HCT: 48.4 % — ABNORMAL HIGH (ref 36.0–46.0)
Hemoglobin: 15.6 g/dL — ABNORMAL HIGH (ref 12.0–15.0)
Immature Granulocytes: 0 %
Lymphocytes Relative: 28 %
Lymphs Abs: 1.3 10*3/uL (ref 0.7–4.0)
MCH: 31.3 pg (ref 26.0–34.0)
MCHC: 32.2 g/dL (ref 30.0–36.0)
MCV: 97.2 fL (ref 80.0–100.0)
Monocytes Absolute: 0.6 10*3/uL (ref 0.1–1.0)
Monocytes Relative: 12 %
Neutro Abs: 2.9 10*3/uL (ref 1.7–7.7)
Neutrophils Relative %: 59 %
Platelets: 164 10*3/uL (ref 150–400)
RBC: 4.98 MIL/uL (ref 3.87–5.11)
RDW: 13.2 % (ref 11.5–15.5)
WBC: 4.9 10*3/uL (ref 4.0–10.5)
nRBC: 0 % (ref 0.0–0.2)

## 2019-09-16 MED ORDER — SODIUM CHLORIDE 0.9 % IV SOLN
Freq: Once | INTRAVENOUS | Status: AC
  Administered 2019-09-16: 20:00:00 via INTRAVENOUS

## 2019-09-16 MED ORDER — AMLODIPINE BESYLATE 5 MG PO TABS
2.5000 mg | ORAL_TABLET | Freq: Once | ORAL | Status: AC
  Administered 2019-09-17: 2.5 mg via ORAL
  Filled 2019-09-16: qty 1

## 2019-09-16 NOTE — ED Notes (Signed)
The pt was eating and drinking yesterday  She usually walks with walker  Today she has just been sleeping and she has not been eating and.or drinking today

## 2019-09-16 NOTE — ED Triage Notes (Signed)
The pt arrived by ptar from home where she lives with her daughter  She has had a uti for one week  Hx dementia not eating today elevatedtemp earlier today ems heked her temp  t97.2

## 2019-09-16 NOTE — ED Provider Notes (Signed)
[facility] EMERGENCY DEPARTMENT Provider Note   CSN: [id number] Arrival date & time: 09/16/19  1824     History   Chief Complaint Chief Complaint  Patient presents with  . Recurrent UTI    HPI Julie Bass is a 85 y.o. female.     Pt presents to the ED with a fever and decreased eating today.  Pt has been treated for a UTI for the past week.  Pt denies any pain.  Pt's daughter is here now.  Pt diagnosed with a UTI on 10/25.  She was put on Septra.  Daughter took her home to her house until Friday, 10/30.  Pt was doing better and wanted to go home, so she went back to her own house.  Pt's grandson stayed with her and said she was more confused and had a fever this morning.  They gave her ibuprofen 400 mg this morning.  Pt has also had diarrhea.  No known covid exposures.     Past Medical History:  Diagnosis Date  . Arthritis   . Chronic kidney disease    KIDNEY STONES  . Dementia ([place])    BEGINNING STAGES   . Depression   . GERD (gastroesophageal reflux disease)   . Glaucoma   . Hiatal hernia   . HTN (hypertension) 10/18/2011  . Hyperlipemia 10/18/2011  . NSVT (nonsustained ventricular tachycardia) ([place]) 10/18/2011  . Osteoporosis   . PAD (peripheral artery disease) ([place]) 10/18/2011   h/o left SFA stent    Patient Active Problem List   Diagnosis Date Noted  . Pacemaker 05/28/2019  . CHB (complete heart block) ([place]) 01/20/2018  . OSA (obstructive sleep apnea) 01/20/2018  . 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 GERD (gastroesophageal reflux disease) 01/08/2018  . Chest pain 01/07/2018  . CKD (chronic kidney disease), stage III 01/07/2018  . Bacteremia 06/16/2016  . UTI (lower urinary tract infection) 06/16/2016  . Warthin's tumor 01/01/2016  . Dementia ([place]) 03/14/2015  . Orthostatic hypotension 08/27/2014  . Physical deconditioning 08/27/2014  . Abdominal pain in female 08/27/2014  . Osteoarthrosis, unspecified whether generalized or  localized, shoulder region 01/26/2013  . Chest wall pain 12/26/2012  . Rib contusion 12/26/2012  . NSVT (nonsustained ventricular tachycardia) ([place]) 10/18/2011  . HTN (hypertension) 10/18/2011  . HLD (hyperlipidemia) 10/18/2011  . PAD (peripheral artery disease) ([place]) 10/18/2011  . Syncope 09/30/2011  . Cough 09/30/2011  . Fatigue 09/30/2011  . Fall 09/30/2011  . Pelvic joint pain 09/30/2011  . Back pain 09/30/2011  . Need for prophylactic vaccination and inoculation against influenza 09/30/2011    Past Surgical History:  Procedure Laterality Date  . ABDOMINAL HYSTERECTOMY  1973  . CARDIAC CATHETERIZATION     2012  . CHOLECYSTECTOMY    . LOOP RECORDER IMPLANT N/A 08/27/2014   Procedure: LOOP RECORDER IMPLANT;  Surgeon: Coralyn Mark, MD;  Location: [facility] CATH LAB;  Service: Cardiovascular;  Laterality: N/A;  . LOOP RECORDER REMOVAL N/A 01/09/2018   Procedure: LOOP RECORDER REMOVAL;  Surgeon: Evans Lance, MD;  Location: [facility] CV LAB;  Service: Cardiovascular;  Laterality: N/A;  . LOWER EXTREMITY ANGIOGRAM  11/02/2007   stent of mid left SFA with 6x18mm EV3 self-expanding stent and 6x3 in prox region (Dr. Adora Fridge)  . LOWER EXTREMITY ANGIOGRAM    . LOWER EXTREMITY ARTERIAL DOPPLER  2013   right SFA with 50-69% diameter reduction, right PTA/peroneal occluded, L SFA prox to stent has narrowing with increased velocities >60% diameter reduction, L  SFA stent patent, L ATA w/occlusive disease, bilat ABIs show mild arterial insuffiency at rest  . NM MYOCAR PERF WALL MOTION  10/2011   non-gated - normal stuy, EF 82%, normal LV wall motion  . PACEMAKER IMPLANT N/A 01/09/2018   Procedure: PACEMAKER IMPLANT;  Surgeon: Evans Lance, MD;  Location: [facility] CV LAB;  Service: Cardiovascular;  Laterality: N/A;  . REVERSE SHOULDER ARTHROPLASTY Right 01/26/2013   Procedure: RIGHT REVERSE TOTAL SHOULDER ARTHROPLASTY;  Surgeon: Augustin Schooling, MD;  Location: [facility];  Service: Orthopedics;   Laterality: Right;  . TRANSTHORACIC ECHOCARDIOGRAM  10/2012   EF 75-70%, mod conc hypertrophy, severely calcified MV annulus, LA mildly dailted, PA peak pressure 81mmHg     OB History   No obstetric history on file.      Home Medications    Prior to Admission medications   Medication Sig Start Date End Date Taking? Authorizing Provider  amLODipine (NORVASC) 2.5 MG tablet Take 1 tablet (2.5 mg total) by mouth daily. 05/31/19   Croitoru, Mihai, MD  aspirin EC 81 MG tablet Take 81 mg by mouth every evening.     [provider]  cilostazol (PLETAL) 50 MG tablet Take 1 tablet (50 mg total) by mouth 2 (two) times daily. 08/06/19   Troy Sine, MD  citalopram (CELEXA) 10 MG tablet TAKE 1 TABLET BY MOUTH EVERY DAY 03/19/19   Lorretta Harp, MD  ezetimibe (ZETIA) 10 MG tablet TAKE 1 TABLET BY MOUTH EVERY DAY 08/21/19   Troy Sine, MD  feeding supplement, ENSURE COMPLETE, (ENSURE COMPLETE) LIQD Take 237 mLs by mouth 3 (three) times daily between meals. 08/27/14   Barrett, Evelene Croon, PA-C  geriatric multivitamins-minerals (ELDERTONIC/GEVRABON) LIQD Take 15 mLs by mouth daily.    [provider]  levofloxacin (LEVAQUIN) 500 MG tablet Take 1 tablet (500 mg total) by mouth daily. 06/08/19   Denita Lung, MD  losartan (COZAAR) 100 MG tablet Take 1 tablet (100 mg total) by mouth daily. 06/11/19   Croitoru, Mihai, MD  metoprolol succinate (TOPROL-XL) 100 MG 24 hr tablet Take 1 tablet (100 mg total) by mouth daily. Take with or immediately following a meal. 05/31/19   Croitoru, Mihai, MD  Multiple Vitamins-Minerals (SENIOR MULTIVITAMIN PLUS PO) Take 1 tablet by mouth daily.    [provider]  omega-3 acid ethyl esters (LOVAZA) 1 g capsule Take 1 capsule (1 g total) by mouth every evening. 12/18/18   Troy Sine, MD  pantoprazole (PROTONIX) 40 MG tablet Take 1 tablet (40 mg total) by mouth daily. 06/18/19   Troy Sine, MD  Pitavastatin Calcium (LIVALO) 2 MG TABS Take 1  tablet (2 mg total) by mouth daily. 08/06/19   Troy Sine, MD  sulfamethoxazole-trimethoprim (BACTRIM DS) 800-160 MG tablet Take 1 tablet by mouth 2 (two) times daily. 09/09/19   Denita Lung, MD  timolol (TIMOPTIC) 0.5 % ophthalmic solution Place 1 drop into both eyes 2 (two) times daily.     [provider]    Family History Family History  Problem Relation Age of Onset  . Throat cancer Father   . Cancer Mother   . CAD Son   . Breast cancer Sister        cervical cancer  . Colon cancer Brother        throat cancer  . Pulmonary embolism Daughter   . Hypertension Daughter     Social History Social History   Tobacco Use  .  Smoking status: Former Smoker    Quit date: 05/07/1984    Years since quitting: 35.3  . Smokeless tobacco: Never Used  Substance Use Topics  . Alcohol use: No  . Drug use: No     Allergies   Atorvastatin calcium, Crestor [rosuvastatin calcium], Lipitor [atorvastatin calcium], and Rosuvastatin calcium   Review of Systems Review of Systems  Constitutional: Positive for fever.  All other systems reviewed and are negative.    Physical Exam Updated Vital Signs BP (!) 202/63   Pulse 69   Temp 98.4 F (36.9 C) (Rectal)   Resp (!) 25   Ht 5\' 4"  (1.626 m)   Wt 51.7 kg   SpO2 96%   BMI 19.57 kg/m   Physical Exam Vitals signs and nursing note reviewed.  Constitutional:      Appearance: Normal appearance.  HENT:     Head: Normocephalic and atraumatic.     Right Ear: External ear normal.     Left Ear: External ear normal.     Nose: Nose normal.     Mouth/Throat:     Mouth: Mucous membranes are moist.     Pharynx: Oropharynx is clear.  Eyes:     Extraocular Movements: Extraocular movements intact.     Comments: Right eye blind (large cataract)  Neck:     Musculoskeletal: Normal range of motion and neck supple.  Cardiovascular:     Rate and Rhythm: Normal rate and regular rhythm.     Pulses: Normal pulses.     Heart  sounds: Normal heart sounds.  Pulmonary:     Effort: Pulmonary effort is normal.     Breath sounds: Normal breath sounds.  Abdominal:     General: Abdomen is flat. Bowel sounds are normal.     Palpations: Abdomen is soft.  Musculoskeletal: Normal range of motion.  Skin:    General: Skin is warm.     Capillary Refill: Capillary refill takes less than 2 seconds.  Neurological:     General: No focal deficit present.     Mental Status: She is alert. Mental status is at baseline.  Psychiatric:        Mood and Affect: Mood normal.        Behavior: Behavior normal.      ED Treatments / Results  Labs (all labs ordered are listed, but only abnormal results are displayed) Labs Reviewed  CBC WITH DIFFERENTIAL/PLATELET - Abnormal; Notable for the following components:      Result Value   Hemoglobin 15.6 (*)    HCT 48.4 (*)    All other components within normal limits  COMPREHENSIVE METABOLIC PANEL - Abnormal; Notable for the following components:   CO2 20 (*)    Glucose, Bld 109 (*)    Creatinine, Ser 1.70 (*)    Calcium 10.4 (*)    GFR calc non Af Amer 26 (*)    GFR calc Af Amer 30 (*)    All other components within normal limits  URINE CULTURE  SARS CORONAVIRUS 2 (TAT 6-24 HRS)  LACTIC ACID, PLASMA  URINALYSIS, ROUTINE W REFLEX MICROSCOPIC  CBG MONITORING, ED    EKG EKG Interpretation  Date/Time:  Sunday September 16 2019 18:51:27 EST Ventricular Rate:  65 PR Interval:    QRS Duration: 84 QT Interval:  443 QTC Calculation: 461 R Axis:   -9 Text Interpretation: Sinus or ectopic atrial rhythm Prolonged PR interval Borderline ST depression, lateral leads No significant change since last tracing Confirmed by Gilford Raid,  Almyra Free [phone]) on 09/16/2019 8:12:29 PM   Radiology Dg Chest 2 View  Result Date: 09/16/2019 CLINICAL DATA:  Initial evaluation for fever, altered mental status. History of dementia. EXAM: CHEST - 2 VIEW COMPARISON:  Prior radiograph from 01/10/2018.  FINDINGS: Dual lead left-sided transvenous pacemaker/AICD in place with electrodes overlying the right atrium and right ventricle, stable. Mild cardiomegaly, unchanged. Mediastinal silhouette within normal limits. Aortic atherosclerosis. Lungs normally inflated. Mild subsegmental atelectatic changes at the lung bases. No focal infiltrates or consolidative opacity. No pulmonary edema or pleural effusion. No pneumothorax. No acute osseous finding. Right shoulder arthroplasty noted. T9 compression deformity is unchanged. Additional mild compression deformity of L1, new from previous, but favored to be chronic. IMPRESSION: 1. Mild bibasilar subsegmental atelectatic changes. No other active cardiopulmonary disease. 2. Mild cardiomegaly without pulmonary edema. Electronically Signed   By: Jeannine Boga M.D.   On: 09/16/2019 19:37    Procedures Procedures (including critical care time)  Medications Ordered in ED Medications  amLODipine (NORVASC) tablet 2.5 mg (has no administration in time range)  0.9 %  sodium chloride infusion ( Intravenous New Bag/Given 09/16/19 2009)     Initial Impression / Assessment and Plan / ED Course  I have reviewed the triage vital signs and the nursing notes.  Pertinent labs & imaging results that were available during my care of the patient were reviewed by me and considered in my medical decision making (see chart for details).     Pt is feeling a little better after IVFs.  She is eating and drinking.  Urine is pending at shift change.  Pt signed out to Dr. Betsey Holiday.  covid swab is pending.  Julie Bass was evaluated in Emergency Department on 09/16/2019 for the symptoms described in the history of present illness. She was evaluated in the context of the global COVID-19 pandemic, which necessitated consideration that the patient might be at risk for infection with the SARS-CoV-2 virus that causes COVID-19. Institutional protocols and algorithms that pertain  to the evaluation of patients at risk for COVID-19 are in a state of rapid change based on information released by regulatory bodies including the CDC and federal and state organizations. These policies and algorithms were followed during the patient's care in the ED.  Final Clinical Impressions(s) / ED Diagnoses   Final diagnoses:  Transient alteration of awareness    ED Discharge Orders    None       Isla Pence, MD 09/16/19 2340

## 2019-09-17 ENCOUNTER — Other Ambulatory Visit: Payer: Self-pay

## 2019-09-17 ENCOUNTER — Observation Stay (HOSPITAL_COMMUNITY): Payer: Medicare Other

## 2019-09-17 DIAGNOSIS — Z87891 Personal history of nicotine dependence: Secondary | ICD-10-CM | POA: Diagnosis not present

## 2019-09-17 DIAGNOSIS — U071 COVID-19: Secondary | ICD-10-CM | POA: Diagnosis not present

## 2019-09-17 DIAGNOSIS — R197 Diarrhea, unspecified: Secondary | ICD-10-CM | POA: Diagnosis not present

## 2019-09-17 DIAGNOSIS — I10 Essential (primary) hypertension: Secondary | ICD-10-CM

## 2019-09-17 DIAGNOSIS — Z87440 Personal history of urinary (tract) infections: Secondary | ICD-10-CM | POA: Diagnosis not present

## 2019-09-17 DIAGNOSIS — G4733 Obstructive sleep apnea (adult) (pediatric): Secondary | ICD-10-CM | POA: Diagnosis not present

## 2019-09-17 DIAGNOSIS — N179 Acute kidney failure, unspecified: Secondary | ICD-10-CM | POA: Diagnosis not present

## 2019-09-17 DIAGNOSIS — E7800 Pure hypercholesterolemia, unspecified: Secondary | ICD-10-CM | POA: Diagnosis not present

## 2019-09-17 DIAGNOSIS — E785 Hyperlipidemia, unspecified: Secondary | ICD-10-CM | POA: Diagnosis not present

## 2019-09-17 DIAGNOSIS — R531 Weakness: Secondary | ICD-10-CM | POA: Diagnosis not present

## 2019-09-17 DIAGNOSIS — R404 Transient alteration of awareness: Secondary | ICD-10-CM | POA: Diagnosis not present

## 2019-09-17 DIAGNOSIS — R5081 Fever presenting with conditions classified elsewhere: Secondary | ICD-10-CM | POA: Diagnosis not present

## 2019-09-17 DIAGNOSIS — Z96611 Presence of right artificial shoulder joint: Secondary | ICD-10-CM | POA: Diagnosis not present

## 2019-09-17 DIAGNOSIS — I472 Ventricular tachycardia: Secondary | ICD-10-CM | POA: Diagnosis not present

## 2019-09-17 DIAGNOSIS — M810 Age-related osteoporosis without current pathological fracture: Secondary | ICD-10-CM | POA: Diagnosis not present

## 2019-09-17 DIAGNOSIS — I471 Supraventricular tachycardia: Secondary | ICD-10-CM | POA: Diagnosis not present

## 2019-09-17 DIAGNOSIS — H409 Unspecified glaucoma: Secondary | ICD-10-CM | POA: Diagnosis not present

## 2019-09-17 DIAGNOSIS — N183 Chronic kidney disease, stage 3 unspecified: Secondary | ICD-10-CM | POA: Diagnosis not present

## 2019-09-17 DIAGNOSIS — E860 Dehydration: Secondary | ICD-10-CM | POA: Diagnosis not present

## 2019-09-17 DIAGNOSIS — I129 Hypertensive chronic kidney disease with stage 1 through stage 4 chronic kidney disease, or unspecified chronic kidney disease: Secondary | ICD-10-CM | POA: Diagnosis not present

## 2019-09-17 DIAGNOSIS — K219 Gastro-esophageal reflux disease without esophagitis: Secondary | ICD-10-CM | POA: Diagnosis not present

## 2019-09-17 DIAGNOSIS — Z808 Family history of malignant neoplasm of other organs or systems: Secondary | ICD-10-CM | POA: Diagnosis not present

## 2019-09-17 DIAGNOSIS — F0390 Unspecified dementia without behavioral disturbance: Secondary | ICD-10-CM | POA: Diagnosis present

## 2019-09-17 DIAGNOSIS — N281 Cyst of kidney, acquired: Secondary | ICD-10-CM | POA: Diagnosis not present

## 2019-09-17 DIAGNOSIS — F329 Major depressive disorder, single episode, unspecified: Secondary | ICD-10-CM | POA: Diagnosis present

## 2019-09-17 DIAGNOSIS — K449 Diaphragmatic hernia without obstruction or gangrene: Secondary | ICD-10-CM | POA: Diagnosis not present

## 2019-09-17 DIAGNOSIS — I739 Peripheral vascular disease, unspecified: Secondary | ICD-10-CM | POA: Diagnosis not present

## 2019-09-17 DIAGNOSIS — M1990 Unspecified osteoarthritis, unspecified site: Secondary | ICD-10-CM | POA: Diagnosis not present

## 2019-09-17 DIAGNOSIS — F0150 Vascular dementia without behavioral disturbance: Secondary | ICD-10-CM | POA: Diagnosis not present

## 2019-09-17 DIAGNOSIS — R221 Localized swelling, mass and lump, neck: Secondary | ICD-10-CM | POA: Diagnosis not present

## 2019-09-17 LAB — URINALYSIS, ROUTINE W REFLEX MICROSCOPIC
Bacteria, UA: NONE SEEN
Bilirubin Urine: NEGATIVE
Glucose, UA: NEGATIVE mg/dL
Hgb urine dipstick: NEGATIVE
Ketones, ur: NEGATIVE mg/dL
Leukocytes,Ua: NEGATIVE
Nitrite: NEGATIVE
Protein, ur: 30 mg/dL — AB
Specific Gravity, Urine: 1.019 (ref 1.005–1.030)
pH: 5 (ref 5.0–8.0)

## 2019-09-17 LAB — COMPREHENSIVE METABOLIC PANEL
ALT: 19 U/L (ref 0–44)
AST: 29 U/L (ref 15–41)
Albumin: 3.7 g/dL (ref 3.5–5.0)
Alkaline Phosphatase: 42 U/L (ref 38–126)
Anion gap: 9 (ref 5–15)
BUN: 15 mg/dL (ref 8–23)
CO2: 21 mmol/L — ABNORMAL LOW (ref 22–32)
Calcium: 9.8 mg/dL (ref 8.9–10.3)
Chloride: 106 mmol/L (ref 98–111)
Creatinine, Ser: 1.26 mg/dL — ABNORMAL HIGH (ref 0.44–1.00)
GFR calc Af Amer: 43 mL/min — ABNORMAL LOW (ref >60)
GFR calc non Af Amer: 37 mL/min — ABNORMAL LOW (ref >60)
Glucose, Bld: 100 mg/dL — ABNORMAL HIGH (ref 70–99)
Potassium: 5.3 mmol/L — ABNORMAL HIGH (ref 3.5–5.1)
Sodium: 136 mmol/L (ref 135–145)
Total Bilirubin: 1.4 mg/dL — ABNORMAL HIGH (ref 0.3–1.2)
Total Protein: 6.9 g/dL (ref 6.5–8.1)

## 2019-09-17 LAB — CBC
HCT: 46.9 % — ABNORMAL HIGH (ref 36.0–46.0)
Hemoglobin: 15.2 g/dL — ABNORMAL HIGH (ref 12.0–15.0)
MCH: 31.7 pg (ref 26.0–34.0)
MCHC: 32.4 g/dL (ref 30.0–36.0)
MCV: 97.9 fL (ref 80.0–100.0)
Platelets: 172 10*3/uL (ref 150–400)
RBC: 4.79 MIL/uL (ref 3.87–5.11)
RDW: 13.3 % (ref 11.5–15.5)
WBC: 5.3 10*3/uL (ref 4.0–10.5)
nRBC: 0 % (ref 0.0–0.2)

## 2019-09-17 LAB — SODIUM, URINE, RANDOM: Sodium, Ur: 106 mmol/L

## 2019-09-17 LAB — PROTEIN / CREATININE RATIO, URINE
Creatinine, Urine: 173.55 mg/dL
Protein Creatinine Ratio: 0.16 mg/mg{Cre} — ABNORMAL HIGH (ref 0.00–0.15)
Total Protein, Urine: 28 mg/dL

## 2019-09-17 LAB — SARS CORONAVIRUS 2 (TAT 6-24 HRS): SARS Coronavirus 2: POSITIVE — AB

## 2019-09-17 LAB — URINE CULTURE: Culture: NO GROWTH

## 2019-09-17 MED ORDER — CITALOPRAM HYDROBROMIDE 10 MG PO TABS
10.0000 mg | ORAL_TABLET | Freq: Every day | ORAL | Status: DC
  Administered 2019-09-17: 10 mg via ORAL
  Filled 2019-09-17: qty 1

## 2019-09-17 MED ORDER — TIMOLOL MALEATE 0.5 % OP SOLN
1.0000 [drp] | Freq: Two times a day (BID) | OPHTHALMIC | Status: DC
  Administered 2019-09-17 – 2019-09-18 (×3): 1 [drp] via OPHTHALMIC
  Filled 2019-09-17: qty 5

## 2019-09-17 MED ORDER — CILOSTAZOL 50 MG PO TABS
50.0000 mg | ORAL_TABLET | Freq: Two times a day (BID) | ORAL | Status: DC
  Administered 2019-09-17 – 2019-09-18 (×3): 50 mg via ORAL
  Filled 2019-09-17 (×7): qty 1

## 2019-09-17 MED ORDER — PRAVASTATIN SODIUM 10 MG PO TABS
20.0000 mg | ORAL_TABLET | Freq: Every day | ORAL | Status: DC
  Filled 2019-09-17 (×2): qty 2

## 2019-09-17 MED ORDER — ACETAMINOPHEN 325 MG PO TABS: 650.0000 mg | ORAL_TABLET | Freq: Four times a day (QID) | ORAL | Status: DC | PRN

## 2019-09-17 MED ORDER — SODIUM ZIRCONIUM CYCLOSILICATE 10 G PO PACK
10.0000 g | PACK | Freq: Three times a day (TID) | ORAL | Status: AC
  Administered 2019-09-17: 10 g via ORAL
  Filled 2019-09-17: qty 1

## 2019-09-17 MED ORDER — ADULT MULTIVITAMIN LIQUID CH
15.0000 mL | Freq: Every day | ORAL | Status: DC
  Administered 2019-09-17 – 2019-09-18 (×2): 15 mL via ORAL
  Filled 2019-09-17 (×4): qty 15

## 2019-09-17 MED ORDER — ACETAMINOPHEN 650 MG RE SUPP: 650.0000 mg | Freq: Four times a day (QID) | RECTAL | Status: DC | PRN

## 2019-09-17 MED ORDER — EZETIMIBE 10 MG PO TABS
10.0000 mg | ORAL_TABLET | Freq: Every day | ORAL | Status: DC
  Administered 2019-09-17 – 2019-09-18 (×2): 10 mg via ORAL
  Filled 2019-09-17 (×4): qty 1

## 2019-09-17 MED ORDER — METOPROLOL SUCCINATE ER 100 MG PO TB24
100.0000 mg | ORAL_TABLET | Freq: Every day | ORAL | Status: DC
  Administered 2019-09-17 – 2019-09-18 (×2): 100 mg via ORAL
  Filled 2019-09-17 (×2): qty 1

## 2019-09-17 MED ORDER — HYDRALAZINE HCL 20 MG/ML IJ SOLN
5.0000 mg | Freq: Four times a day (QID) | INTRAMUSCULAR | Status: DC | PRN
  Administered 2019-09-18: 5 mg via INTRAVENOUS
  Filled 2019-09-17: qty 1
  Filled 2019-09-17 (×2): qty 0.25

## 2019-09-17 MED ORDER — ENOXAPARIN SODIUM 30 MG/0.3ML ~~LOC~~ SOLN
30.0000 mg | SUBCUTANEOUS | Status: DC
  Administered 2019-09-17: 30 mg via SUBCUTANEOUS
  Filled 2019-09-17 (×2): qty 0.3

## 2019-09-17 MED ORDER — SODIUM CHLORIDE 0.9 % IV SOLN
INTRAVENOUS | Status: AC
  Administered 2019-09-17: 08:00:00 via INTRAVENOUS

## 2019-09-17 MED ORDER — ENSURE ENLIVE PO LIQD
237.0000 mL | Freq: Three times a day (TID) | ORAL | Status: DC
  Administered 2019-09-17 – 2019-09-18 (×3): 237 mL via ORAL
  Filled 2019-09-17 (×7): qty 237

## 2019-09-17 MED ORDER — AMLODIPINE BESYLATE 5 MG PO TABS
2.5000 mg | ORAL_TABLET | Freq: Every day | ORAL | Status: DC
  Administered 2019-09-17 – 2019-09-18 (×2): 2.5 mg via ORAL
  Filled 2019-09-17 (×2): qty 1

## 2019-09-17 MED ORDER — PANTOPRAZOLE SODIUM 40 MG PO TBEC: 40.0000 mg | DELAYED_RELEASE_TABLET | Freq: Every day | ORAL | Status: DC | PRN

## 2019-09-17 NOTE — Progress Notes (Signed)
PT Cancellation Note  Patient Details Name: DEENA SHIPPEN MRN: [id number] DOB: 1926-01-18   Cancelled Treatment:    Reason Eval/Treat Not Completed: Other (comment); patient transferred to [facility],  Will complete PT evaluation next day.    Reginia Naas 09/17/2019, 1:23 PM  Magda Kiel, [place] [phone] 09/17/2019

## 2019-09-17 NOTE — ED Notes (Signed)
Breakfast Ordered 

## 2019-09-17 NOTE — Progress Notes (Signed)
ASSUMPTION OF CARE NOTE   09/17/2019 9:48 AM  Julie Bass was seen and examined.  The H&P by the admitting provider, orders, imaging was reviewed.  Please see new orders.  Will continue to follow.  PT HAS TESTED POSITIVE FOR COVID 19.  I INFORMED PATIENT PLACEMENT COORDINATOR LYNN WHO DIRECTED ME TO CHANGE ORDERS FOR PATIENT TO BE ADMITTED TO [facility].  PT IS STABLE. 98% ON ROOM AIR. CONTINUE SUPPORTIVE CARE.  I CALLED TO INFORM PATIENT'S DAUGHTER AND LEFT MESSAGE PATIENT IS GOING TO [place].   Vitals:   09/17/19 0654 09/17/19 0800  BP:  (!) 158/82  Pulse: 90 95  Resp: (!) 22 (!) 22  Temp:    SpO2: 93% 97%    Results for orders placed or performed during the hospital encounter of 09/16/19  SARS CORONAVIRUS 2 (TAT 6-24 HRS) Nasopharyngeal Nasopharyngeal Swab   Specimen: Nasopharyngeal Swab  Result Value Ref Range   SARS Coronavirus 2 POSITIVE (A) NEGATIVE  CBC with Differential  Result Value Ref Range   WBC 4.9 4.0 - 10.5 K/uL   RBC 4.98 3.87 - 5.11 MIL/uL   Hemoglobin 15.6 (H) 12.0 - 15.0 g/dL   HCT 48.4 (H) 36.0 - 46.0 %   MCV 97.2 80.0 - 100.0 fL   MCH 31.3 26.0 - 34.0 pg   MCHC 32.2 30.0 - 36.0 g/dL   RDW 13.2 11.5 - 15.5 %   Platelets 164 150 - 400 K/uL   nRBC 0.0 0.0 - 0.2 %   Neutrophils Relative % 59 %   Neutro Abs 2.9 1.7 - 7.7 K/uL   Lymphocytes Relative 28 %   Lymphs Abs 1.3 0.7 - 4.0 K/uL   Monocytes Relative 12 %   Monocytes Absolute 0.6 0.1 - 1.0 K/uL   Eosinophils Relative 1 %   Eosinophils Absolute 0.0 0.0 - 0.5 K/uL   Basophils Relative 0 %   Basophils Absolute 0.0 0.0 - 0.1 K/uL   Immature Granulocytes 0 %   Abs Immature Granulocytes 0.01 0.00 - 0.07 K/uL  Comprehensive metabolic panel  Result Value Ref Range   Sodium 137 135 - 145 mmol/L   Potassium 4.6 3.5 - 5.1 mmol/L   Chloride 106 98 - 111 mmol/L   CO2 20 (L) 22 - 32 mmol/L   Glucose, Bld 109 (H) 70 - 99 mg/dL   BUN 22 8 - 23 mg/dL   Creatinine, Ser 1.70 (H) 0.44 - 1.00 mg/dL   Calcium 10.4 (H)  8.9 - 10.3 mg/dL   Total Protein 7.3 6.5 - 8.1 g/dL   Albumin 4.0 3.5 - 5.0 g/dL   AST 30 15 - 41 U/L   ALT 20 0 - 44 U/L   Alkaline Phosphatase 42 38 - 126 U/L   Total Bilirubin 1.1 0.3 - 1.2 mg/dL   GFR calc non Af Amer 26 (L) >60 mL/min   GFR calc Af Amer 30 (L) >60 mL/min   Anion gap 11 5 - 15  Urinalysis, Routine w reflex microscopic  Result Value Ref Range   Color, Urine YELLOW YELLOW   APPearance CLEAR CLEAR   Specific Gravity, Urine 1.019 1.005 - 1.030   pH 5.0 5.0 - 8.0   Glucose, UA NEGATIVE NEGATIVE mg/dL   Hgb urine dipstick NEGATIVE NEGATIVE   Bilirubin Urine NEGATIVE NEGATIVE   Ketones, ur NEGATIVE NEGATIVE mg/dL   Protein, ur 30 (A) NEGATIVE mg/dL   Nitrite NEGATIVE NEGATIVE   Leukocytes,Ua NEGATIVE NEGATIVE   WBC, UA 0-5 0 -  5 WBC/hpf   Bacteria, UA NONE SEEN NONE SEEN   Squamous Epithelial / LPF 0-5 0 - 5   Mucus PRESENT   Lactic acid, plasma  Result Value Ref Range   Lactic Acid, Venous 1.3 0.5 - 1.9 mmol/L  Comprehensive metabolic panel  Result Value Ref Range   Sodium 136 135 - 145 mmol/L   Potassium 5.3 (H) 3.5 - 5.1 mmol/L   Chloride 106 98 - 111 mmol/L   CO2 21 (L) 22 - 32 mmol/L   Glucose, Bld 100 (H) 70 - 99 mg/dL   BUN 15 8 - 23 mg/dL   Creatinine, Ser 1.26 (H) 0.44 - 1.00 mg/dL   Calcium 9.8 8.9 - 10.3 mg/dL   Total Protein 6.9 6.5 - 8.1 g/dL   Albumin 3.7 3.5 - 5.0 g/dL   AST 29 15 - 41 U/L   ALT 19 0 - 44 U/L   Alkaline Phosphatase 42 38 - 126 U/L   Total Bilirubin 1.4 (H) 0.3 - 1.2 mg/dL   GFR calc non Af Amer 37 (L) >60 mL/min   GFR calc Af Amer 43 (L) >60 mL/min   Anion gap 9 5 - 15  CBC  Result Value Ref Range   WBC 5.3 4.0 - 10.5 K/uL   RBC 4.79 3.87 - 5.11 MIL/uL   Hemoglobin 15.2 (H) 12.0 - 15.0 g/dL   HCT 46.9 (H) 36.0 - 46.0 %   MCV 97.9 80.0 - 100.0 fL   MCH 31.7 26.0 - 34.0 pg   MCHC 32.4 30.0 - 36.0 g/dL   RDW 13.3 11.5 - 15.5 %   Platelets 172 150 - 400 K/uL   nRBC 0.0 0.0 - 0.2 %  Sodium, urine, random  Result  Value Ref Range   Sodium, Ur 106 mmol/L  Protein / creatinine ratio, urine  Result Value Ref Range   Creatinine, Urine 173.55 mg/dL   Total Protein, Urine 28 mg/dL   Protein Creatinine Ratio 0.16 (H) 0.00 - 0.15 mg/mg[Cre]   TOTAL TIME SPENT 30 Consuella Lose, MD [facility]   09/16/2019  6:24 PM How to contact the [facility] Attending or Consulting provider [place] or covering provider during after hours 7P -7A, for this patient?  1. Check the care team in [facility] and look for a) attending/consulting [facility] provider listed and b) the [facility] team listed 2. Log into [email or web address] and use [place]'s universal password to access. If you do not have the password, please contact the hospital operator. 3. Locate the [facility] provider you are looking for under [facility] and page to a number that you can be directly reached. 4. If you still have difficulty reaching the provider, please page the [facility] (Director on Call) for the Hospitalists listed on amion for assistance.

## 2019-09-17 NOTE — ED Notes (Signed)
asmitting dotor in room

## 2019-09-17 NOTE — Progress Notes (Signed)
Daughter updated. No other needs or concerns at this time. Will continue to monitor.

## 2019-09-17 NOTE — ED Provider Notes (Signed)
Patient signed out to me by Dr. Lorretta Harp to follow-up on urinalysis.  Patient has been treated with Septra by her primary care doctor this past week for a UTI.  She initially had urinary tract symptoms including a fever and altered mental status but improved with adding the Septra.  This morning, however, patient has had once again altered mental status as well as significant weakness.  Daughter reports that she had a fever of 101.  They have not been able to get her up and out of bed and she has not been able to eat or drink.  Urinalysis does not suggest continued infection.  She has a normal white blood cell count and normal lactic acid, no sign of sepsis.  She does not have a fever here.  She does have an elevation of her creatinine, possibly simply dehydration.  She has received a liter of fluid here in the ER over her period of evaluation, still is very weak.  She is still requiring 2 people to get her up out of bed to a wheelchair to go to the bathroom.  She cannot stand on her own to feed or support her own weight.  Just yesterday she was ambulating with a walker.  This is a significant change.  Discussed with Dr. Maudie Mercury, hospitalist.  He does feel that she can be brought into the hospital for continued hydration, possible PT evaluation.   Orpah Greek, MD 09/17/19 [phone]

## 2019-09-17 NOTE — ED Notes (Signed)
Lunch Tray Ordered @ 1032. 

## 2019-09-17 NOTE — H&P (Signed)
TRH H&P    Patient Demographics:    Julie Bass, is a 85 y.o. female  MRN: [id number]  DOB - [birth year]  Admit Date - 09/16/2019  Referring MD/NP/PA:   Scot Jun  Outpatient Primary MD for the patient is Denita Lung, MD  Patient coming from:  home  Chief complaint- fever, weakness   HPI:    Julie Bass  is a 85 y.o. female, with hypertension, hyperlipidemia, pad, h/o L SFA stent, depression, dementia who presents per daughter due to fever to 101 as well as somnolence today.  Pt has had recent UTI and treated with Bactrim.  Pt as started last Sunday and still is taking the abx.    In Ed,  T 98.4,  P 65 R 16, Bp 164/66 pox 100% on RA Wt 51.7 kg  CXR IMPRESSION: 1. Mild bibasilar subsegmental atelectatic changes. No other active ardiopulmonary disease. 2. Mild cardiomegaly without pulmonary edema.  Na 137, K 4.6, Bun 22, Creatinine 1.70 Calcium 10.4, Alb 4.0 Ast 30, Alt 20 Lactic acid 1.3 Urinalysis negative  Pt was given ns 250mL iv x1 in the ED.   Pt will be admitted for AMS and weakness and ? diarrhea and ARF       Review of systems:    In addition to the HPI above,  No Fever-chills, No Headache, No changes with Vision or hearing, No problems swallowing food or Liquids, No Chest pain, Cough or Shortness of Breath, No Abdominal pain, No Nausea or Vomiting,  No Blood in stool or Urine, No dysuria, No new skin rashes or bruises, No new joints pains-aches,  No new weakness, tingling, numbness in any extremity, No recent weight gain or loss, No polyuria, polydypsia or polyphagia, No significant Mental Stressors.  All other systems reviewed and are negative.    Past History of the following :    Past Medical History:  Diagnosis Date  . Arthritis   . Chronic kidney disease    KIDNEY STONES  . Dementia (HCC)    BEGINNING STAGES   . Depression   . GERD  (gastroesophageal reflux disease)   . Glaucoma   . Hiatal hernia   . HTN (hypertension) 10/18/2011  . Hyperlipemia 10/18/2011  . NSVT (nonsustained ventricular tachycardia) (HCC) 10/18/2011  . Osteoporosis   . PAD (peripheral artery disease) (HCC) 10/18/2011   h/o left SFA stent      Past Surgical History:  Procedure Laterality Date  . ABDOMINAL HYSTERECTOMY  1973  . CARDIAC CATHETERIZATION     20 12  . CHOLECYSTECTOMY    . LOOP RECORDER IMPLANT N/A 08/27/2014   Procedure: LOOP RECORDER IMPLANT;  Surgeon: Coralyn Mark, MD;  Location: [facility];  Service: Cardiovascular;  Laterality: N/A;  . LOOP RECORDER REMOVAL N/A 01/09/2018   Procedure: LOOP RECORDER REMOVAL;  Surgeon: Evans Lance, MD;  Location: [facility];  Service: Cardiovascular;  Laterality: N/A;  . LOWER EXTREMITY ANGIOGRAM  11/02/2007   stent of mid left SFA with 6x175mm EV3 self-expanding stent and 6x3 in  prox region (Dr. Adora Fridge)  . LOWER EXTREMITY ANGIOGRAM    . LOWER EXTREMITY ARTERIAL DOPPLER  2013   right SFA with 50-69% diameter reduction, right PTA/peroneal occluded, L SFA prox to stent has narrowing with increased velocities >60% diameter reduction, L SFA stent patent, L ATA w/occlusive disease, bilat ABIs show mild arterial insuffiency at rest  . NM MYOCAR PERF WALL MOTION  10/2011   non-gated - normal stuy, EF 82%, normal LV wall motion  . PACEMAKER IMPLANT N/A 01/09/2018   Procedure: PACEMAKER IMPLANT;  Surgeon: Evans Lance, MD;  Location: [facility];  Service: Cardiovascular;  Laterality: N/A;  . REVERSE SHOULDER ARTHROPLASTY Right 01/26/2013   Procedure: RIGHT REVERSE TOTAL SHOULDER ARTHROPLASTY;  Surgeon: Augustin Schooling, MD;  Location: [facility];  Service: Orthopedics;  Laterality: Right;  . TRANSTHORACIC ECHOCARDIOGRAM  10/2012   EF 75-70%, mod conc hypertrophy, severely calcified MV annulus, LA mildly dailted, PA peak pressure 27mmHg      Social History:      Social History    Tobacco Use  . Smoking status: Former Smoker    Quit date: 05/07/1984    Years since quitting: 35.3  . Smokeless tobacco: Never Used  Substance Use Topics  . Alcohol use: No       Family History :     Family History  Problem Relation Age of Onset  . Throat cancer Father   . Cancer Mother   . CAD Son   . Breast cancer Sister        cervical cancer  . Colon cancer Brother        throat cancer  . Pulmonary embolism Daughter   . Hypertension Daughter        Home Medications:   Prior to Admission medications   Medication Sig Start Date End Date Taking? Authorizing Provider  amLODipine (NORVASC) 2.5 MG tablet Take 1 tablet (2.5 mg total) by mouth daily. 05/31/19  Yes Croitoru, Mihai, MD  cilostazol (PLETAL) 50 MG tablet Take 1 tablet (50 mg total) by mouth 2 (two) times daily. 08/06/19  Yes Troy Sine, MD  citalopram (CELEXA) 10 MG tablet TAKE 1 TABLET BY MOUTH EVERY DAY Patient taking differently: Take 10 mg by mouth at bedtime.  03/19/19  Yes Lorretta Harp, MD  ezetimibe (ZETIA) 10 MG tablet TAKE 1 TABLET BY MOUTH EVERY DAY Patient taking differently: Take 10 mg by mouth daily.  08/21/19  Yes Troy Sine, MD  feeding supplement, ENSURE COMPLETE, (ENSURE COMPLETE) LIQD Take 237 mLs by mouth 3 (three) times daily between meals. 08/27/14  Yes Barrett, Evelene Croon, PA-C  geriatric multivitamins-minerals (ELDERTONIC/GEVRABON) LIQD Take 15 mLs by mouth daily.   Yes [provider]  losartan (COZAAR) 100 MG tablet Take 1 tablet (100 mg total) by mouth daily. 06/11/19  Yes Croitoru, Mihai, MD  metoprolol succinate (TOPROL-XL) 100 MG 24 hr tablet Take 1 tablet (100 mg total) by mouth daily. Take with or immediately following a meal. 05/31/19  Yes Croitoru, Mihai, MD  omega-3 acid ethyl esters (LOVAZA) 1 g capsule Take 1 capsule (1 g total) by mouth every evening. 12/18/18  Yes Troy Sine, MD  pantoprazole (PROTONIX) 40 MG tablet Take 1 tablet (40 mg total) by mouth  daily. Patient taking differently: Take 40 mg by mouth daily as needed (for acid reflux).  06/18/19  Yes Troy Sine, MD  Pitavastatin Calcium (LIVALO) 2 MG TABS Take 1 tablet (2 mg total) by mouth  daily. 08/06/19  Yes Troy Sine, MD  sulfamethoxazole-trimethoprim (BACTRIM DS) 800-160 MG tablet Take 1 tablet by mouth 2 (two) times daily. 09/09/19  Yes Denita Lung, MD  timolol (TIMOPTIC) 0.5 % ophthalmic solution Place 1 drop into both eyes 2 (two) times daily.    Yes [provider]  levofloxacin (LEVAQUIN) 500 MG tablet Take 1 tablet (500 mg total) by mouth daily. Patient not taking: Reported on 09/16/2019 06/08/19   Denita Lung, MD     Allergies:     Allergies  Allergen Reactions  . Atorvastatin Calcium     Other reaction(s): Myalgias (intolerance) Muscle pain  . Crestor [Rosuvastatin Calcium]     Muscle pain  . Lipitor [Atorvastatin Calcium]     Muscle pain  . Rosuvastatin Calcium     Other reaction(s): Myalgias (intolerance) Muscle pain     Physical Exam:   Vitals  Blood pressure (!) 152/70, pulse 72, temperature 98.4 F (36.9 C), temperature source Rectal, resp. rate (!) 22, height 5\' 4"  (1.626 m), weight 51.7 kg, SpO2 95 %.  1.  General: axoxo3 (person, hospital, year)  2. Psychiatric: euthymic  3. Neurologic: cn2-12 intact, reflexes 2+ symmetric, difffuse with no clonus, motor 5/5 in all 4ext  4. HEENMT:  Anicteric, right cornea cloudy, pupils 1.77mm left, direct, consensual, intact Neck: no jvd  5. Respiratory : CTAB  6. Cardiovascular : rrr s1, s2, 1/6 sem rusb  7. Gastrointestinal:  Abd: soft, nt, nd, +bs  8. Skin:  Ext: no c/c/e, no rash  9.Musculoskeletal:  Good ROM    Data Review:    CBC Recent Labs  Lab 09/16/19 1955  WBC 4.9  HGB 15.6*  HCT 48.4*  PLT 164  MCV 97.2  MCH 31.3  MCHC 32.2  RDW 13.2  LYMPHSABS 1.3  MONOABS 0.6  EOSABS 0.0  BASOSABS 0.0    ------------------------------------------------------------------------------------------------------------------  Results for orders placed or performed during the hospital encounter of 09/16/19 (from the past 48 hour(s))  CBC with Differential     Status: Abnormal   Collection Time: 09/16/19  7:55 PM  Result Value Ref Range   WBC 4.9 4.0 - 10.5 K/uL   RBC 4.98 3.87 - 5.11 MIL/uL   Hemoglobin 15.6 (H) 12.0 - 15.0 g/dL   HCT 48.4 (H) 36.0 - 46.0 %   MCV 97.2 80.0 - 100.0 fL   MCH 31.3 26.0 - 34.0 pg   MCHC 32.2 30.0 - 36.0 g/dL   RDW 13.2 11.5 - 15.5 %   Platelets 164 150 - 400 K/uL   nRBC 0.0 0.0 - 0.2 %   Neutrophils Relative % 59 %   Neutro Abs 2.9 1.7 - 7.7 K/uL   Lymphocytes Relative 28 %   Lymphs Abs 1.3 0.7 - 4.0 K/uL   Monocytes Relative 12 %   Monocytes Absolute 0.6 0.1 - 1.0 K/uL   Eosinophils Relative 1 %   Eosinophils Absolute 0.0 0.0 - 0.5 K/uL   Basophils Relative 0 %   Basophils Absolute 0.0 0.0 - 0.1 K/uL   Immature Granulocytes 0 %   Abs Immature Granulocytes 0.01 0.00 - 0.07 K/uL    Comment: Performed at [facility], [address], [place], [place] [address]  Comprehensive metabolic panel     Status: Abnormal   Collection Time: 09/16/19  7:55 PM  Result Value Ref Range   Sodium 137 135 - 145 mmol/L   Potassium 4.6 3.5 - 5.1 mmol/L   Chloride 106 98 -  111 mmol/L   CO2 20 (L) 22 - 32 mmol/L   Glucose, Bld 109 (H) 70 - 99 mg/dL   BUN 22 8 - 23 mg/dL   Creatinine, Ser 1.70 (H) 0.44 - 1.00 mg/dL   Calcium 10.4 (H) 8.9 - 10.3 mg/dL   Total Protein 7.3 6.5 - 8.1 g/dL   Albumin 4.0 3.5 - 5.0 g/dL   AST 30 15 - 41 U/L   ALT 20 0 - 44 U/L   Alkaline Phosphatase 42 38 - 126 U/L   Total Bilirubin 1.1 0.3 - 1.2 mg/dL   GFR calc non Af Amer 26 (L) >60 mL/min   GFR calc Af Amer 30 (L) >60 mL/min   Anion gap 11 5 - 15    Comment: Performed at [facility] [address] [place], [place] [address]  Lactic acid, plasma     Status: None    Collection Time: 09/16/19  7:56 PM  Result Value Ref Range   Lactic Acid, Venous 1.3 0.5 - 1.9 mmol/L    Comment: Performed at [facility] [address] [place], [place] [address]  Urinalysis, Routine w reflex microscopic     Status: Abnormal   Collection Time: 09/17/19  1:03 AM  Result Value Ref Range   Color, Urine YELLOW YELLOW   APPearance CLEAR CLEAR   Specific Gravity, Urine 1.019 1.005 - 1.030   pH 5.0 5.0 - 8.0   Glucose, UA NEGATIVE NEGATIVE mg/dL   Hgb urine dipstick NEGATIVE NEGATIVE   Bilirubin Urine NEGATIVE NEGATIVE   Ketones, ur NEGATIVE NEGATIVE mg/dL   Protein, ur 30 (A) NEGATIVE mg/dL   Nitrite NEGATIVE NEGATIVE   Leukocytes,Ua NEGATIVE NEGATIVE   WBC, UA 0-5 0 - 5 WBC/hpf   Bacteria, UA NONE SEEN NONE SEEN   Squamous Epithelial / LPF 0-5 0 - 5   Mucus PRESENT     Comment: Performed at [facility], [place] [address] [place], [place] [address]    Chemistries  Recent Labs  Lab 09/16/19 1955  NA 137  K 4.6  CL 106  CO2 20*  GLUCOSE 109*  BUN 22  CREATININE 1.70*  CALCIUM 10.4*  AST 30  ALT 20  ALKPHOS 42  BILITOT 1.1   ------------------------------------------------------------------------------------------------------------------  ------------------------------------------------------------------------------------------------------------------ GFR: Estimated Creatinine Clearance: 17.6 mL/min (A) (by C-G formula based on SCr of 1.7 mg/dL (H)). Liver Function Tests: Recent Labs  Lab 09/16/19 1955  AST 30  ALT 20  ALKPHOS 42  BILITOT 1.1  PROT 7.3  ALBUMIN 4.0   No results for input(s): LIPASE, AMYLASE in the last 168 hours. No results for input(s): AMMONIA in the last 168 hours. Coagulation Profile: No results for input(s): INR, PROTIME in the last 168 hours. Cardiac Enzymes: No results for input(s): CKTOTAL, CKMB, CKMBINDEX, TROPONINI in the last 168 hours. BNP (last 3 results) No results for input(s): PROBNP in the last  8760 hours. HbA1C: No results for input(s): HGBA1C in the last 72 hours. CBG: No results for input(s): GLUCAP in the last 168 hours. Lipid Profile: No results for input(s): CHOL, HDL, LDLCALC, TRIG, CHOLHDL, LDLDIRECT in the last 72 hours. Thyroid Function Tests: No results for input(s): TSH, T4TOTAL, FREET4, T3FREE, THYROIDAB in the last 72 hours. Anemia Panel: No results for input(s): VITAMINB12, FOLATE, FERRITIN, TIBC, IRON, RETICCTPCT in the last 72 hours.  --------------------------------------------------------------------------------------------------------------- Urine analysis:    Component Value Date/Time   COLORURINE YELLOW 09/17/2019 0103   APPEARANCEUR CLEAR 09/17/2019 0103  LABSPEC 1.019 09/17/2019 0103   PHURINE 5.0 09/17/2019 0103   GLUCOSEU NEGATIVE 09/17/2019 0103   HGBUR NEGATIVE 09/17/2019 0103   BILIRUBINUR NEGATIVE 09/17/2019 0103   BILIRUBINUR n 11/25/2016 1409   KETONESUR NEGATIVE 09/17/2019 0103   PROTEINUR 30 (A) 09/17/2019 0103   UROBILINOGEN negative 11/25/2016 1409   UROBILINOGEN 1 10/08/2014 1529   NITRITE NEGATIVE 09/17/2019 0103   LEUKOCYTESUR NEGATIVE 09/17/2019 0103      Imaging Results:    Dg Chest 2 View  Result Date: 09/16/2019 CLINICAL DATA:  Initial evaluation for fever, altered mental status. History of dementia. EXAM: CHEST - 2 VIEW COMPARISON:  Prior radiograph from 01/10/2018. FINDINGS: Dual lead left-sided transvenous pacemaker/AICD in place with electrodes overlying the right atrium and right ventricle, stable. Mild cardiomegaly, unchanged. Mediastinal silhouette within normal limits. Aortic atherosclerosis. Lungs normally inflated. Mild subsegmental atelectatic changes at the lung bases. No focal infiltrates or consolidative opacity. No pulmonary edema or pleural effusion. No pneumothorax. No acute osseous finding. Right shoulder arthroplasty noted. T9 compression deformity is unchanged. Additional mild compression deformity of L1,  new from previous, but favored to be chronic. IMPRESSION: 1. Mild bibasilar subsegmental atelectatic changes. No other active cardiopulmonary disease. 2. Mild cardiomegaly without pulmonary edema. Electronically Signed   By: Jeannine Boga M.D.   On: 09/16/2019 19:37      Assessment & Plan:    Principal Problem:   ARF (acute renal failure) (HCC) Active Problems:   HTN (hypertension)   Weakness  ARF ddx Bactrim, Dehydration Hold Losartan  Check renal ultrasound Check urine sodium, urine creatinine, urine eosinophils Hydrate with normal saline  Check cmp in am  Weakness ? Secondary to dehydration Hydrate with ns iv If persistent consider MRI brain  H/o UTI Currently urinalysis negative Stop abx  H/o fever Monitor  Hypertension/ SVT Cont Metoprolol 100mg  po qday Cont Amlodipine 2.5mg  po qday  Hyperlipidemia, PVD Cont Pletal 50mg  po bid Cont Livalo 2mg  po qday Consider Xarelto 2.5mg  po qday FDA approved for PVD  Glaucoma Cont Timoptic    DVT Prophylaxis-   Lovenox - SCDs   AM Labs Ordered, also please review Full Orders  Family Communication: Admission, patients condition and plan of care including tests being ordered have been discussed with the patient  who indicate understanding and agree with the plan and Code Status.  Code Status:  FULL CODE per daughter.  Daughter is present with patient in ED.   Admission status: Observation: Based on patients clinical presentation and evaluation of above clinical data, I have made determination that patient meets observation criteria at this time.  Time spent in minutes : 70   Jani Gravel M.D on 09/17/2019 at 3:19 AM

## 2019-09-17 NOTE — ED Notes (Signed)
Daughter at bedside until 5am  Will ome back

## 2019-09-18 DIAGNOSIS — R404 Transient alteration of awareness: Secondary | ICD-10-CM

## 2019-09-18 DIAGNOSIS — R509 Fever, unspecified: Secondary | ICD-10-CM | POA: Diagnosis present

## 2019-09-18 DIAGNOSIS — I739 Peripheral vascular disease, unspecified: Secondary | ICD-10-CM | POA: Diagnosis present

## 2019-09-18 DIAGNOSIS — N179 Acute kidney failure, unspecified: Secondary | ICD-10-CM | POA: Diagnosis present

## 2019-09-18 DIAGNOSIS — I472 Ventricular tachycardia: Secondary | ICD-10-CM

## 2019-09-18 DIAGNOSIS — R5081 Fever presenting with conditions classified elsewhere: Secondary | ICD-10-CM

## 2019-09-18 DIAGNOSIS — R221 Localized swelling, mass and lump, neck: Secondary | ICD-10-CM | POA: Diagnosis present

## 2019-09-18 DIAGNOSIS — K449 Diaphragmatic hernia without obstruction or gangrene: Secondary | ICD-10-CM

## 2019-09-18 DIAGNOSIS — I10 Essential (primary) hypertension: Secondary | ICD-10-CM | POA: Diagnosis present

## 2019-09-18 DIAGNOSIS — F0150 Vascular dementia without behavioral disturbance: Secondary | ICD-10-CM

## 2019-09-18 LAB — COMPREHENSIVE METABOLIC PANEL
ALT: 20 U/L (ref 0–44)
AST: 33 U/L (ref 15–41)
Albumin: 4.4 g/dL (ref 3.5–5.0)
Alkaline Phosphatase: 50 U/L (ref 38–126)
Anion gap: 13 (ref 5–15)
BUN: 16 mg/dL (ref 8–23)
CO2: 17 mmol/L — ABNORMAL LOW (ref 22–32)
Calcium: 10.3 mg/dL (ref 8.9–10.3)
Chloride: 105 mmol/L (ref 98–111)
Creatinine, Ser: 0.98 mg/dL (ref 0.44–1.00)
GFR calc Af Amer: 58 mL/min — ABNORMAL LOW (ref >60)
GFR calc non Af Amer: 50 mL/min — ABNORMAL LOW (ref >60)
Glucose, Bld: 109 mg/dL — ABNORMAL HIGH (ref 70–99)
Potassium: 4.4 mmol/L (ref 3.5–5.1)
Sodium: 135 mmol/L (ref 135–145)
Total Bilirubin: 1.3 mg/dL — ABNORMAL HIGH (ref 0.3–1.2)
Total Protein: 8.4 g/dL — ABNORMAL HIGH (ref 6.5–8.1)

## 2019-09-18 LAB — BASIC METABOLIC PANEL
Anion gap: 11 (ref 5–15)
BUN: 16 mg/dL (ref 8–23)
CO2: 18 mmol/L — ABNORMAL LOW (ref 22–32)
Calcium: 10.4 mg/dL — ABNORMAL HIGH (ref 8.9–10.3)
Chloride: 107 mmol/L (ref 98–111)
Creatinine, Ser: 0.99 mg/dL (ref 0.44–1.00)
GFR calc Af Amer: 58 mL/min — ABNORMAL LOW (ref >60)
GFR calc non Af Amer: 50 mL/min — ABNORMAL LOW (ref >60)
Glucose, Bld: 112 mg/dL — ABNORMAL HIGH (ref 70–99)
Potassium: 4.4 mmol/L (ref 3.5–5.1)
Sodium: 136 mmol/L (ref 135–145)

## 2019-09-18 LAB — CBC WITH DIFFERENTIAL/PLATELET
Abs Immature Granulocytes: 0.01 10*3/uL (ref 0.00–0.07)
Basophils Absolute: 0 10*3/uL (ref 0.0–0.1)
Basophils Relative: 1 %
Eosinophils Absolute: 0 10*3/uL (ref 0.0–0.5)
Eosinophils Relative: 0 %
HCT: 51.3 % — ABNORMAL HIGH (ref 36.0–46.0)
Hemoglobin: 16.7 g/dL — ABNORMAL HIGH (ref 12.0–15.0)
Immature Granulocytes: 0 %
Lymphocytes Relative: 23 %
Lymphs Abs: 1.5 10*3/uL (ref 0.7–4.0)
MCH: 31 pg (ref 26.0–34.0)
MCHC: 32.6 g/dL (ref 30.0–36.0)
MCV: 95.4 fL (ref 80.0–100.0)
Monocytes Absolute: 0.6 10*3/uL (ref 0.1–1.0)
Monocytes Relative: 9 %
Neutro Abs: 4.4 10*3/uL (ref 1.7–7.7)
Neutrophils Relative %: 67 %
Platelets: 220 10*3/uL (ref 150–400)
RBC: 5.38 MIL/uL — ABNORMAL HIGH (ref 3.87–5.11)
RDW: 13.2 % (ref 11.5–15.5)
WBC: 6.6 10*3/uL (ref 4.0–10.5)
nRBC: 0 % (ref 0.0–0.2)

## 2019-09-18 LAB — MAGNESIUM: Magnesium: 1.9 mg/dL (ref 1.7–2.4)

## 2019-09-18 LAB — FERRITIN: Ferritin: 211 ng/mL (ref 11–307)

## 2019-09-18 LAB — C-REACTIVE PROTEIN: CRP: 4.3 mg/dL — ABNORMAL HIGH (ref <1.0)

## 2019-09-18 LAB — D-DIMER, QUANTITATIVE: D-Dimer, Quant: 1.13 ug/mL-FEU — ABNORMAL HIGH (ref 0.00–0.50)

## 2019-09-18 LAB — LACTATE DEHYDROGENASE: LDH: 188 U/L (ref 98–192)

## 2019-09-18 MED ORDER — AMLODIPINE BESYLATE 2.5 MG PO TABS
2.5000 mg | ORAL_TABLET | Freq: Once | ORAL | Status: DC
  Filled 2019-09-18: qty 1

## 2019-09-18 MED ORDER — AMLODIPINE BESYLATE 2.5 MG PO TABS: 2.5000 mg | ORAL_TABLET | Freq: Once | ORAL | 0 refills | Status: DC

## 2019-09-18 MED ORDER — AMLODIPINE BESYLATE 5 MG PO TABS: 5.0000 mg | ORAL_TABLET | Freq: Every day | ORAL | 0 refills | Status: DC

## 2019-09-18 MED ORDER — AMLODIPINE BESYLATE 5 MG PO TABS: 5.0000 mg | ORAL_TABLET | Freq: Every day | ORAL | Status: DC

## 2019-09-18 NOTE — Discharge Summary (Addendum)
Physician Discharge Summary  Julie Bass [id number] DOB: [birth year] DOA: 09/16/2019  PCP: Julie Lung, MD  Admit date: 09/16/2019 Discharge date: 09/18/2019  Time spent: 30 minutes  Recommendations for Outpatient Follow-up:    Acute renal failure Recent Labs  Lab 09/16/19 1955 09/17/19 0519 09/18/19 0613  CREATININE 1.70* 1.26* 0.98  0.99  -Resolved -Most likely secondary to Bactrim + dehydration -Renal ultrasound negative for acute findings.  Chronic finding of complex cyst see results below.  Hx UTI -Currently negative sign of UTI antibiotics discontinued before transport to [place]  Hx fever -Afebrile last 24 hours  Weakness -PT/OT per note was able to ambulate a few steps with maximum help.  See PT note.  PT recommends SNF however family has declined in favor of home health.  Essential HTN/SVT -Currently NSR -11/3 increase amlodipine 5 mg daily -Toprol  100 mg daily  HLD/PVD -Cilostazol 50 mg  BID -Pravastatin 20 mg daily in place of home Pitavastatin  -Given patient's age, eyesight, and weakness would not anticoagulate for PVD.  Believed to high risk for fall   Glaucoma -Atenolol 0.5% ophthalmic solution   LEFT neck mass -Negative erythema, negative warmth, negative pain to palpation suspect, though suspect chronic however given patient's mental status she is unable to answer that question. Julie Bass (daughter) confirms mass has been on her neck for many years, has been biopsied and found to be benign.  Patient refused removal.  Goals of care -Family wishes patient discharged home with home health.  Face-to-face has been placed.    Discharge Diagnoses:  Principal Problem:   ARF (acute renal failure) (HCC) Active Problems:   NSVT (nonsustained ventricular tachycardia) (HCC)   HTN (hypertension)   HLD (hyperlipidemia)   PAD (peripheral artery disease) (HCC)   Dementia (HCC)   CKD (chronic kidney disease), stage III   OSA (obstructive sleep  apnea)   Weakness   Hiatal hernia   Peripheral artery disease (HCC)   Acute renal failure (ARF) (HCC)   Fever   Essential hypertension   Neck mass   Discharge Condition: Stable  Diet recommendation: Heart healthy Filed Weights   09/16/19 1901  Weight: 51.7 kg    History of present illness:  [name] a89 y.o.BF  PMHx Depression, Dementia, OSA, HTN, NSVT, PAD s/p LEFT SFA stent, HLD, CKD Stage III , hiatal hernia,  Presents per daughter due to fever to 101 as well as somnolence today. Pt has had recent UTI and treated with Bactrim. Pt as started last Sunday and still is taking the abx.   In Ed,  T 98.4, P 65 R 16, Bp 164/66 pox 100% on RA Wt 51.7 kg  Hospital Course:  During his hospitalization patient was treated for electrolyte abnormality secondary to dehydration.  Patient has electrolyte abnormalities and AKI have resolved with hydration.  Unable at baseline.  Stable for discharge SNF   Procedure 11/2 CT head W. Wo contrast;No acute intracranial abnormalit 11/2 ultrasound renal;Right Kidney:Renal measurements: 9.3 x 2.9 x 3.8 cm = volume: 53 mL . -Echogenicity WNL  Left Kidney:measurements: 8.6 x 4 x 3.5 cm = volume: 62 mL.; complex cyst in the upper pole of the left kidney measuring approximately 3 x 2.5 cm. There are calcifications deep to this cystic structure. This is overall similar to prior CT from 2010.    Antibiotics Anti-infectives (From admission, onward)   None       Discharge Exam: Vitals:   09/18/19 0320 09/18/19 0347 09/18/19 0510 09/18/19 0954  BP: Marland Kitchen)  157/83 (S) (!) 173/92 136/75 132/77  Pulse: 72 74 88   Resp: 12 20 20    Temp: 98.1 F (36.7 C)     TempSrc: Oral     SpO2: 96% 95% 97%   Weight:      Height:        General: A/O x1 (does not know where, why, when).  Does know she is in the hospital.  Negative acute respiratory failure. Cardiovascular: Regular rhythm and rate, negative murmurs rubs or gallops, normal  S1/S2 Respiratory: Clear to auscultation bilateral, negative crackles, wheezes Skin; large mass LEFT side of neck patient unable to tell us how long it has been there.  Discharge Instructions   Allergies as of 09/18/2019      Reactions   Atorvastatin Calcium    Other reaction(s): Myalgias (intolerance) Muscle pain   Crestor [rosuvastatin Calcium]    Muscle pain   Lipitor [atorvastatin Calcium]    Muscle pain   Rosuvastatin Calcium    Other reaction(s): Myalgias (intolerance) Muscle pain      Medication List    STOP taking these medications   levofloxacin 500 MG tablet Commonly known as: LEVAQUIN   sulfamethoxazole-trimethoprim 800-160 MG tablet Commonly known as: BACTRIM DS     TAKE these medications   amLODipine 2.5 MG tablet Commonly known as: NORVASC Take 1 tablet (2.5 mg total) by mouth daily. What changed: Another medication with the same name was added. Make sure you understand how and when to take each.   amLODipine 2.5 MG tablet Commonly known as: NORVASC Take 1 tablet (2.5 mg total) by mouth once for 1 dose. What changed: You were already taking a medication with the same name, and this prescription was added. Make sure you understand how and when to take each.   amLODipine 5 MG tablet Commonly known as: NORVASC Take 1 tablet (5 mg total) by mouth daily. Start taking on: September 19, 2019 What changed: You were already taking a medication with the same name, and this prescription was added. Make sure you understand how and when to take each.   cilostazol 50 MG tablet Commonly known as: PLETAL Take 1 tablet (50 mg total) by mouth 2 (two) times daily.   citalopram 10 MG tablet Commonly known as: CELEXA TAKE 1 TABLET BY MOUTH EVERY DAY What changed: when to take this   ezetimibe 10 MG tablet Commonly known as: ZETIA TAKE 1 TABLET BY MOUTH EVERY DAY   feeding supplement (ENSURE COMPLETE) Liqd Take 237 mLs by mouth 3 (three) times daily between meals.    geriatric multivitamins-minerals Liqd Take 15 mLs by mouth daily.   Livalo 2 MG Tabs Generic drug: Pitavastatin Calcium Take 1 tablet (2 mg total) by mouth daily.   losartan 100 MG tablet Commonly known as: COZAAR Take 1 tablet (100 mg total) by mouth daily.   metoprolol succinate 100 MG 24 hr tablet Commonly known as: TOPROL-XL Take 1 tablet (100 mg total) by mouth daily. Take with or immediately following a meal.   omega-3 acid ethyl esters 1 g capsule Commonly known as: LOVAZA Take 1 capsule (1 g total) by mouth every evening.   pantoprazole 40 MG tablet Commonly known as: PROTONIX Take 1 tablet (40 mg total) by mouth daily. What changed:   when to take this  reasons to take this   timolol 0.5 % ophthalmic solution Commonly known as: TIMOPTIC Place 1 drop into both eyes 2 (two) times daily.      Allergies  Allergen Reactions  . Atorvastatin Calcium     Other reaction(s): Myalgias (intolerance) Muscle pain  . Crestor [Rosuvastatin Calcium]     Muscle pain  . Lipitor [Atorvastatin Calcium]     Muscle pain  . Rosuvastatin Calcium     Other reaction(s): Myalgias (intolerance) Muscle pain      The results of significant diagnostics from this hospitalization (including imaging, microbiology, ancillary and laboratory) are listed below for reference.    Significant Diagnostic Studies: Dg Chest 2 View  Result Date: 09/16/2019 CLINICAL DATA:  Initial evaluation for fever, altered mental status. History of dementia. EXAM: CHEST - 2 VIEW COMPARISON:  Prior radiograph from 01/10/2018. FINDINGS: Dual lead left-sided transvenous pacemaker/AICD in place with electrodes overlying the right atrium and right ventricle, stable. Mild cardiomegaly, unchanged. Mediastinal silhouette within normal limits. Aortic atherosclerosis. Lungs normally inflated. Mild subsegmental atelectatic changes at the Bass bases. No focal infiltrates or consolidative opacity. No pulmonary edema or  pleural effusion. No pneumothorax. No acute osseous finding. Right shoulder arthroplasty noted. T9 compression deformity is unchanged. Additional mild compression deformity of L1, new from previous, but favored to be chronic. IMPRESSION: 1. Mild bibasilar subsegmental atelectatic changes. No other active cardiopulmonary disease. 2. Mild cardiomegaly without pulmonary edema. Electronically Signed   By: Jeannine Boga M.D.   On: 09/16/2019 19:37   Ct Head Wo Contrast  Result Date: 09/17/2019 CLINICAL DATA:  Altered mental status, weakness EXAM: CT HEAD WITHOUT CONTRAST TECHNIQUE: Contiguous axial images were obtained from the base of the skull through the vertex without intravenous contrast. COMPARISON:  01/08/2018 FINDINGS: Brain: There is severe atrophy and chronic small vessel disease changes. No acute intracranial abnormality. Specifically, no hemorrhage, hydrocephalus, mass lesion, acute infarction, or significant intracranial injury. Vascular: No hyperdense vessel or unexpected calcification. Skull: No acute calvarial abnormality. Sinuses/Orbits: Visualized paranasal sinuses and mastoids clear. Orbital soft tissues unremarkable. Other: None IMPRESSION: Atrophy, chronic microvascular disease. No acute intracranial abnormality. Electronically Signed   By: Rolm Baptise M.D.   On: 09/17/2019 03:44   US Renal  Result Date: 09/17/2019 CLINICAL DATA:  Acute renal failure EXAM: RENAL / URINARY TRACT ULTRASOUND COMPLETE COMPARISON:  May 06, 2009 CT FINDINGS: Right Kidney: Renal measurements: 9.3 x 2.9 x 3.8 cm = volume: 53 mL . Echogenicity within normal limits. No mass or hydronephrosis visualized. Left Kidney: Renal measurements: 8.6 x 4 x 3.5 cm = volume: 62 mL. There is a complex cyst in the upper pole of the left kidney measuring approximately 3 x 2.5 cm. There are calcifications deep to this cystic structure. This is overall similar to prior CT from 2010. Bladder: Appears normal for degree of bladder  distention. Other: None IMPRESSION: No acute abnormality.  No hydronephrosis. Electronically Signed   By: Constance Holster M.D.   On: 09/17/2019 04:35    Microbiology: Recent Results (from the past 240 hour(s))  SARS CORONAVIRUS 2 (TAT 6-24 HRS) Nasopharyngeal Nasopharyngeal Swab     Status: Abnormal   Collection Time: 09/16/19 10:51 PM   Specimen: Nasopharyngeal Swab  Result Value Ref Range Status   SARS Coronavirus 2 POSITIVE (A) NEGATIVE Final    Comment: RESULT CALLED TO, READ BACK BY AND VERIFIED WITH: N KOONTZ,RN AT [id number] 09/17/2019 BY L BENFIELD (NOTE) SARS-CoV-2 target nucleic acids are DETECTED. The SARS-CoV-2 RNA is generally detectable in upper and lower respiratory specimens during the acute phase of infection. Positive results are indicative of active infection with SARS-CoV-2. Clinical  correlation with patient history and other diagnostic information is  necessary to determine patient infection status. Positive results do  not rule out bacterial infection or co-infection with other viruses. The expected result is Negative. Fact Sheet for Patients: SugarRoll.be Fact Sheet for Healthcare Providers: https://www.Jacqlyn Marolf-mathews.com/ This test is not yet approved or cleared by the [place] FDA and  has been authorized for detection and/or diagnosis of SARS-CoV-2 by FDA under an Emergency Use Authorization (EUA). This EUA will remain  in effect (meaning this test can be use d) for the duration of the COVID-19 declaration under Section 564(b)(1) of the Act, 21 U.S.C. section 360bbb-3(b)(1), unless the authorization is terminated or revoked sooner. Performed at [facility], [place] [address], [place], [place] [address]   Urine culture     Status: None   Collection Time: 09/17/19  1:03 AM   Specimen: Urine, Random  Result Value Ref Range Status   Specimen Description URINE, RANDOM  Final   Special Requests NONE  Final    Culture   Final    NO GROWTH Performed at [facility], [place] [address], [place], [place] [address]    Report Status 09/17/2019 FINAL  Final     Labs: Basic Metabolic Panel: Recent Labs  Lab 09/16/19 1955 09/17/19 0519 09/18/19 0613  NA 137 136 135  136  K 4.6 5.3* 4.4  4.4  CL 106 106 105  107  CO2 20* 21* 17*  18*  GLUCOSE 109* 100* 109*  112*  BUN 22 15 16  16   CREATININE 1.70* 1.26* 0.98  0.99  CALCIUM 10.4* 9.8 10.3  10.4*  MG  --   --  1.9   Liver Function Tests: Recent Labs  Lab 09/16/19 1955 09/17/19 0519 09/18/19 0613  AST 30 29 33  ALT 20 19 20   ALKPHOS 42 42 50  BILITOT 1.1 1.4* 1.3*  PROT 7.3 6.9 8.4*  ALBUMIN 4.0 3.7 4.4   No results for input(s): LIPASE, AMYLASE in the last 168 hours. No results for input(s): AMMONIA in the last 168 hours. CBC: Recent Labs  Lab 09/16/19 1955 09/17/19 0519 09/18/19 0613  WBC 4.9 5.3 6.6  NEUTROABS 2.9  --  4.4  HGB 15.6* 15.2* 16.7*  HCT 48.4* 46.9* 51.3*  MCV 97.2 97.9 95.4  PLT 164 172 220   Cardiac Enzymes: No results for input(s): CKTOTAL, CKMB, CKMBINDEX, TROPONINI in the last 168 hours. BNP: BNP (last 3 results) No results for input(s): BNP in the last 8760 hours.  ProBNP (last 3 results) No results for input(s): PROBNP in the last 8760 hours.  CBG: No results for input(s): GLUCAP in the last 168 hours.     Signed:  Dia Crawford, MD [facility] [phone] pager

## 2019-09-18 NOTE — Evaluation (Signed)
Physical Therapy Evaluation Patient Details Name: Julie Bass MRN: [id number] DOB: 1926/06/05 Today's Date: 09/18/2019   History of Present Illness  85 y/o female w/ hx of PAD. osteoporosis, NSVT, HLD, HTN, glaucoma, GERD, depression, dementia, CKD, arthritis. Pacemaker. Presented to ED w/ fever and decreased eating was found to be COVID + 11/2  Clinical Impression   Pt admitted with above diagnosis. PTA was living home alone but has family members who can stay with her when needed. Pt currently with functional limitations due to the deficits listed below (see PT Problem List). Pt is extremely weak this am needing mod-max a with most mobility. Pt was able to sit unsupported EOB x approx 5 mins, attempted to take some steps but only able to take 3  Steps at EOB with max a and HHA. Pt will benefit from skilled PT to increase their independence and safety with mobility to allow discharge to the venue listed below.       Follow Up Recommendations SNF    Equipment Recommendations  None recommended by PT    Recommendations for Other Services OT consult     Precautions / Restrictions Precautions Precautions: Fall Restrictions Weight Bearing Restrictions: No      Mobility  Bed Mobility Overal bed mobility: Needs Assistance Bed Mobility: Supine to Sit;Sit to Supine     Supine to sit: Mod assist Sit to supine: Mod assist      Transfers Overall transfer level: Needs assistance Equipment used: 1 person hand held assist Transfers: Sit to/from Stand Sit to Stand: Max assist;Mod assist            Ambulation/Gait Ambulation/Gait assistance: Max assist Gait Distance (Feet): 3 Feet Assistive device: 1 person hand held assist Gait Pattern/deviations: Shuffle Gait velocity: very slow   General Gait Details: only able to take few steps at EOB this am  Stairs            Wheelchair Mobility    Modified Rankin (Stroke Patients Only)       Balance                                             Pertinent Vitals/Pain Pain Assessment: Faces Faces Pain Scale: Hurts even more Pain Location: abdomen, states "right at the top" Pain Intervention(s): Limited activity within patient's tolerance    Home Living Family/patient expects to be discharged to:: Private residence Living Arrangements: Other relatives Available Help at Discharge: Family Type of Home: House           Additional Comments: states grandson stays with her when needed    Prior Function Level of Independence: Needs assistance   Gait / Transfers Assistance Needed: states has walker and walking stick, needs some assist with mobility   ADL's / Homemaking Assistance Needed: needs assist with ADLs and IADLs        Hand Dominance        Extremity/Trunk Assessment   Upper Extremity Assessment Upper Extremity Assessment: Defer to OT evaluation    Lower Extremity Assessment Lower Extremity Assessment: Generalized weakness       Communication   Communication: Other (comment)(confused)  Cognition Arousal/Alertness: Lethargic Behavior During Therapy: Flat affect Overall Cognitive Status: No family/caregiver present to determine baseline cognitive functioning  General Comments General comments (skin integrity, edema, etc.): Pt moving very slowly this am, was able to get to EOB and sit unsupported x approx 5 mins. Pt is on room air this am and sats in low 90s    Exercises     Assessment/Plan    PT Assessment Patient needs continued PT services  PT Problem List Decreased strength;Decreased activity tolerance;Decreased balance;Decreased mobility;Decreased coordination;Decreased safety awareness       PT Treatment Interventions Gait training;Functional mobility training;Therapeutic activities;Therapeutic exercise;Balance training;Neuromuscular re-education;Patient/family education    PT Goals  (Current goals can be found in the Care Plan section)  Acute Rehab PT Goals Patient Stated Goal: unable to state goals Time For Goal Achievement: 10/02/19 Potential to Achieve Goals: Fair    Frequency Min 2X/week   Barriers to discharge        Co-evaluation               AM-PAC PT "6 Clicks" Mobility  Outcome Measure Help needed turning from your back to your side while in a flat bed without using bedrails?: A Lot Help needed moving from lying on your back to sitting on the side of a flat bed without using bedrails?: A Lot Help needed moving to and from a bed to a chair (including a wheelchair)?: A Lot Help needed standing up from a chair using your arms (e.g., wheelchair or bedside chair)?: A Lot Help needed to walk in hospital room?: Total Help needed climbing 3-5 steps with a railing? : Total 6 Click Score: 10    End of Session   Activity Tolerance: Patient tolerated treatment well Patient left: in bed;with call bell/phone within reach   PT Visit Diagnosis: Other abnormalities of gait and mobility (R26.89);Unsteadiness on feet (R26.81);Muscle weakness (generalized) (M62.81)    Time: [id number] PT Time Calculation (min) (ACUTE ONLY): 25 min   Charges:   PT Evaluation $PT Eval Moderate Complexity: 1 Mod PT Treatments $Therapeutic Activity: 8-22 mins        Horald Chestnut, PT   Delford Field 09/18/2019, 1:10 PM

## 2019-09-18 NOTE — Progress Notes (Signed)
Updated grandson. No other questions or concerns at this time.

## 2019-09-18 NOTE — Progress Notes (Signed)
Dr. Clydene Laming at bedside. Purwik placed. No needs or concerns at this time/

## 2019-09-18 NOTE — TOC Initial Note (Addendum)
Transition of Care [facility]) - Initial/Assessment Note    Patient Details  Name: Julie Bass MRN: [id number] Date of Birth: 1926-03-26  Transition of Care [facility]) CM/SW Contact:    Julie Labrador, RN Phone Number: 09/18/2019, 3:37 PM  Clinical Narrative:    Per bedside nurse pt is not oriented and decisions are to be made pt pt's daughter Julie Bass.  PTA from home with family, daughter Julie Bass contacted regarding discharge.  CM discussed recommendation for pt to discharge to SNF - daughter declining SNF stating she will take pt home "like before with home health".  CM provided medicare.gov HH choice and daughter chose [facility] (agency previously provided [facility] for pt).  CM informed daughter of the barrier with [employer] and [place] - daughter acknowledged and informed CM that she does not have a preference other than [facility] and she gave CM consent to provide referrals to other agencies to determine acceptability if [facility] can not accept.    Daughter informed CM that she wasn't aware that pt was ready for discharge today and requests to be contacted by attending to discuss.  CM text paged request to attending - attending made aware that daughter is refusing SNF and plans to take pt home with [facility].    Update;  Post daughter's discussion with attending she remains in agreement for pt to discharge home with [facility], daughter will stay with pt at discharge and provide 24/7 supervision,  other family also there.  [facility] unfortunately was not able to accept pt, CM exhausted medicare.gov HH list and the only accepting agency was [facility].  CM informed daughter and informed of quality rating - daughter is in agreement with [facility].  [facility] aware that pt will discharge home today, address confirmed to be correct in Epic .  CM read aloud to daughter PT and OT assessment - daughter declines all other equipment needs with the exception of a 3:1 - choice given- [employer] chosen - AC to deliver to pts room, [employer] informed.  Daughter declined for ambulance  transport home - stated she and her husband would transport pt home.          Expected Discharge Plan: [place]     Patient Goals and CMS Choice     Choice offered to / list presented to : Adult Children  Expected Discharge Plan and Services Expected Discharge Plan: [place]       Living arrangements for the past 2 months: Single Family Home Expected Discharge Date: 09/18/19                    3:1 supplied by [facility]     HH Arranged: RN, Disease Management, PT, OT, Nurse's Aide, Social Work [employer] Agency: [place] Date [place]: 09/18/19 Time [place]: [place] Representative spoke with at [place] Arrangements/Services Living arrangements for the past 2 months: [place] Lives with:: Adult Children          Need for Family Participation in Patient Care: Yes (Comment) Care giver support system in place?: Yes (comment)   Criminal Activity/Legal Involvement Pertinent to Current Situation/Hospitalization: No - Comment as needed  Activities of Daily Living Home Assistive Devices/Equipment: None ADL Screening (condition at time of admission) Patient's cognitive ability adequate to safely complete daily activities?: Yes Is the patient deaf or have difficulty hearing?: No Does the patient have difficulty seeing, even when wearing glasses/contacts?: No Does  the patient have difficulty concentrating, remembering, or making decisions?: Yes Patient able to express need for assistance with ADLs?: Yes Does the patient have difficulty dressing or bathing?: Yes Independently performs ADLs?: No Does the patient have difficulty walking or climbing stairs?: Yes Weakness of Legs: Both Weakness of Arms/Hands: Both  Permission Sought/Granted                  Emotional Assessment       Orientation: : Fluctuating Orientation (Suspected and/or reported [place])   Psych  Involvement: No (comment)  Admission diagnosis:  Transient alteration of awareness [R40.4] ARF (acute renal failure) ([place]) [N17.9] Patient Active Problem List   Diagnosis Date Noted  . Hiatal hernia 09/18/2019  . Peripheral artery disease ([place]) 09/18/2019  . Acute renal failure (ARF) ([place]) 09/18/2019  . Fever 09/18/2019  . Essential hypertension 09/18/2019  . Neck mass 09/18/2019  . Weakness 09/17/2019  . ARF (acute renal failure) ([place]) 09/17/2019  . Pacemaker 05/28/2019  . CHB (complete heart block) ([place]) 01/20/2018  . OSA (obstructive sleep apnea) 01/20/2018  . 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 GERD (gastroesophageal reflux disease) 01/08/2018  . Chest pain 01/07/2018  . CKD (chronic kidney disease), stage III 01/07/2018  . Bacteremia 06/16/2016  . UTI (lower urinary tract infection) 06/16/2016  . Warthin's tumor 01/01/2016  . Dementia ([place]) 03/14/2015  . Orthostatic hypotension 08/27/2014  . Physical deconditioning 08/27/2014  . Abdominal pain in female 08/27/2014  . Osteoarthrosis, unspecified whether generalized or localized, shoulder region 01/26/2013  . Chest wall pain 12/26/2012  . Rib contusion 12/26/2012  . NSVT (nonsustained ventricular tachycardia) ([place]) 10/18/2011  . HTN (hypertension) 10/18/2011  . HLD (hyperlipidemia) 10/18/2011  . PAD (peripheral artery disease) ([place]) 10/18/2011  . Syncope 09/30/2011  . Cough 09/30/2011  . Fatigue 09/30/2011  . Fall 09/30/2011  . Pelvic joint pain 09/30/2011  . Back pain 09/30/2011  . Need for prophylactic vaccination and inoculation against influenza 09/30/2011   PCP:  Denita Lung, MD Pharmacy:   [id number] [address], [place] [address] Phone: [phone] Fax: [phone]     Social Determinants of Health (SDOH) Interventions    Readmission Risk Interventions No flowsheet data found.

## 2019-09-20 ENCOUNTER — Encounter: Payer: Self-pay | Admitting: Cardiovascular Disease

## 2019-09-20 NOTE — Progress Notes (Signed)
Discussed the patient situation with Barry Brunner, her daughter. The patient had a brief presyncopal episode at home associated with low blood pressure, recovering quickly after she was laid horizontally. She is at home recovering from Covid-19 infection, after brief hospitalization at [facility]. She is afebrile and has no breathing difficulties and has only an occasional raspy cough. I recommended discontinuing her amlodipine and losartan, but continuing her beta-blocker.  If her blood pressure increases excessively, will start back on half of her previous dose of losartan. She needs to eat well and stay well-hydrated.  We will touch base again in the morning.  Sanda Klein, MD, [facility] [phone] office [phone] pager

## 2019-09-21 ENCOUNTER — Telehealth: Payer: Self-pay | Admitting: Family Medicine

## 2019-09-21 DIAGNOSIS — N390 Urinary tract infection, site not specified: Secondary | ICD-10-CM | POA: Diagnosis not present

## 2019-09-21 DIAGNOSIS — U071 COVID-19: Secondary | ICD-10-CM | POA: Diagnosis not present

## 2019-09-21 DIAGNOSIS — N183 Chronic kidney disease, stage 3 unspecified: Secondary | ICD-10-CM | POA: Diagnosis not present

## 2019-09-21 DIAGNOSIS — I1310 Hypertensive heart and chronic kidney disease without heart failure, with stage 1 through stage 4 chronic kidney disease, or unspecified chronic kidney disease: Secondary | ICD-10-CM | POA: Diagnosis not present

## 2019-09-21 NOTE — Telephone Encounter (Signed)
Pts home health nurse Mordecai Rasmussen  called and wants orders to see pt 1 time a week for 3 weeks. She can be reached at [phone]

## 2019-09-21 NOTE — Telephone Encounter (Signed)
ok 

## 2019-09-24 DIAGNOSIS — N390 Urinary tract infection, site not specified: Secondary | ICD-10-CM | POA: Diagnosis not present

## 2019-09-24 DIAGNOSIS — I1310 Hypertensive heart and chronic kidney disease without heart failure, with stage 1 through stage 4 chronic kidney disease, or unspecified chronic kidney disease: Secondary | ICD-10-CM | POA: Diagnosis not present

## 2019-09-24 DIAGNOSIS — N183 Chronic kidney disease, stage 3 unspecified: Secondary | ICD-10-CM | POA: Diagnosis not present

## 2019-09-24 DIAGNOSIS — U071 COVID-19: Secondary | ICD-10-CM | POA: Diagnosis not present

## 2019-09-24 NOTE — Telephone Encounter (Signed)
lvm advising of the ok. Julie Bass

## 2019-09-25 DIAGNOSIS — I1310 Hypertensive heart and chronic kidney disease without heart failure, with stage 1 through stage 4 chronic kidney disease, or unspecified chronic kidney disease: Secondary | ICD-10-CM | POA: Diagnosis not present

## 2019-09-25 DIAGNOSIS — N390 Urinary tract infection, site not specified: Secondary | ICD-10-CM | POA: Diagnosis not present

## 2019-09-25 DIAGNOSIS — N183 Chronic kidney disease, stage 3 unspecified: Secondary | ICD-10-CM | POA: Diagnosis not present

## 2019-09-25 DIAGNOSIS — U071 COVID-19: Secondary | ICD-10-CM | POA: Diagnosis not present

## 2019-09-26 ENCOUNTER — Encounter: Payer: Self-pay | Admitting: Family Medicine

## 2019-09-27 DIAGNOSIS — U071 COVID-19: Secondary | ICD-10-CM | POA: Diagnosis not present

## 2019-09-27 DIAGNOSIS — N390 Urinary tract infection, site not specified: Secondary | ICD-10-CM | POA: Diagnosis not present

## 2019-09-27 DIAGNOSIS — I1310 Hypertensive heart and chronic kidney disease without heart failure, with stage 1 through stage 4 chronic kidney disease, or unspecified chronic kidney disease: Secondary | ICD-10-CM | POA: Diagnosis not present

## 2019-09-27 DIAGNOSIS — N183 Chronic kidney disease, stage 3 unspecified: Secondary | ICD-10-CM | POA: Diagnosis not present

## 2019-10-01 ENCOUNTER — Ambulatory Visit (INDEPENDENT_AMBULATORY_CARE_PROVIDER_SITE_OTHER): Payer: Medicare Other | Admitting: *Deleted

## 2019-10-01 DIAGNOSIS — N183 Chronic kidney disease, stage 3 unspecified: Secondary | ICD-10-CM | POA: Diagnosis not present

## 2019-10-01 DIAGNOSIS — I442 Atrioventricular block, complete: Secondary | ICD-10-CM | POA: Diagnosis not present

## 2019-10-01 DIAGNOSIS — N390 Urinary tract infection, site not specified: Secondary | ICD-10-CM | POA: Diagnosis not present

## 2019-10-01 DIAGNOSIS — U071 COVID-19: Secondary | ICD-10-CM | POA: Diagnosis not present

## 2019-10-01 DIAGNOSIS — I472 Ventricular tachycardia: Secondary | ICD-10-CM

## 2019-10-01 DIAGNOSIS — I4729 Other ventricular tachycardia: Secondary | ICD-10-CM

## 2019-10-01 DIAGNOSIS — I1310 Hypertensive heart and chronic kidney disease without heart failure, with stage 1 through stage 4 chronic kidney disease, or unspecified chronic kidney disease: Secondary | ICD-10-CM | POA: Diagnosis not present

## 2019-10-02 DIAGNOSIS — U071 COVID-19: Secondary | ICD-10-CM | POA: Diagnosis not present

## 2019-10-02 DIAGNOSIS — N183 Chronic kidney disease, stage 3 unspecified: Secondary | ICD-10-CM | POA: Diagnosis not present

## 2019-10-02 DIAGNOSIS — I1310 Hypertensive heart and chronic kidney disease without heart failure, with stage 1 through stage 4 chronic kidney disease, or unspecified chronic kidney disease: Secondary | ICD-10-CM | POA: Diagnosis not present

## 2019-10-02 DIAGNOSIS — N390 Urinary tract infection, site not specified: Secondary | ICD-10-CM | POA: Diagnosis not present

## 2019-10-02 LAB — CUP PACEART REMOTE DEVICE CHECK
Date Time Interrogation Session: 20201117093256
Implantable Lead Implant Date: 20190225
Implantable Lead Implant Date: 20190225
Implantable Lead Location: 753859
Implantable Lead Location: 753860
Implantable Lead Model: 377
Implantable Lead Model: 377
Implantable Lead Serial Number: 80514208
Implantable Lead Serial Number: 80598782
Implantable Pulse Generator Implant Date: 20190225
Pulse Gen Model: 407145
Pulse Gen Serial Number: 69245949

## 2019-10-03 DIAGNOSIS — N390 Urinary tract infection, site not specified: Secondary | ICD-10-CM | POA: Diagnosis not present

## 2019-10-03 DIAGNOSIS — N183 Chronic kidney disease, stage 3 unspecified: Secondary | ICD-10-CM | POA: Diagnosis not present

## 2019-10-03 DIAGNOSIS — I1310 Hypertensive heart and chronic kidney disease without heart failure, with stage 1 through stage 4 chronic kidney disease, or unspecified chronic kidney disease: Secondary | ICD-10-CM | POA: Diagnosis not present

## 2019-10-03 DIAGNOSIS — U071 COVID-19: Secondary | ICD-10-CM | POA: Diagnosis not present

## 2019-10-04 DIAGNOSIS — I1310 Hypertensive heart and chronic kidney disease without heart failure, with stage 1 through stage 4 chronic kidney disease, or unspecified chronic kidney disease: Secondary | ICD-10-CM | POA: Diagnosis not present

## 2019-10-04 DIAGNOSIS — U071 COVID-19: Secondary | ICD-10-CM | POA: Diagnosis not present

## 2019-10-04 DIAGNOSIS — N183 Chronic kidney disease, stage 3 unspecified: Secondary | ICD-10-CM | POA: Diagnosis not present

## 2019-10-04 DIAGNOSIS — N390 Urinary tract infection, site not specified: Secondary | ICD-10-CM | POA: Diagnosis not present

## 2019-10-07 DIAGNOSIS — I1310 Hypertensive heart and chronic kidney disease without heart failure, with stage 1 through stage 4 chronic kidney disease, or unspecified chronic kidney disease: Secondary | ICD-10-CM | POA: Diagnosis not present

## 2019-10-07 DIAGNOSIS — N183 Chronic kidney disease, stage 3 unspecified: Secondary | ICD-10-CM | POA: Diagnosis not present

## 2019-10-07 DIAGNOSIS — N390 Urinary tract infection, site not specified: Secondary | ICD-10-CM | POA: Diagnosis not present

## 2019-10-07 DIAGNOSIS — U071 COVID-19: Secondary | ICD-10-CM | POA: Diagnosis not present

## 2019-10-08 DIAGNOSIS — N390 Urinary tract infection, site not specified: Secondary | ICD-10-CM | POA: Diagnosis not present

## 2019-10-08 DIAGNOSIS — I1310 Hypertensive heart and chronic kidney disease without heart failure, with stage 1 through stage 4 chronic kidney disease, or unspecified chronic kidney disease: Secondary | ICD-10-CM | POA: Diagnosis not present

## 2019-10-08 DIAGNOSIS — N183 Chronic kidney disease, stage 3 unspecified: Secondary | ICD-10-CM | POA: Diagnosis not present

## 2019-10-08 DIAGNOSIS — U071 COVID-19: Secondary | ICD-10-CM | POA: Diagnosis not present

## 2019-10-09 DIAGNOSIS — U071 COVID-19: Secondary | ICD-10-CM | POA: Diagnosis not present

## 2019-10-09 DIAGNOSIS — N390 Urinary tract infection, site not specified: Secondary | ICD-10-CM | POA: Diagnosis not present

## 2019-10-09 DIAGNOSIS — I1310 Hypertensive heart and chronic kidney disease without heart failure, with stage 1 through stage 4 chronic kidney disease, or unspecified chronic kidney disease: Secondary | ICD-10-CM | POA: Diagnosis not present

## 2019-10-09 DIAGNOSIS — N183 Chronic kidney disease, stage 3 unspecified: Secondary | ICD-10-CM | POA: Diagnosis not present

## 2019-10-10 DIAGNOSIS — I1310 Hypertensive heart and chronic kidney disease without heart failure, with stage 1 through stage 4 chronic kidney disease, or unspecified chronic kidney disease: Secondary | ICD-10-CM | POA: Diagnosis not present

## 2019-10-10 DIAGNOSIS — N390 Urinary tract infection, site not specified: Secondary | ICD-10-CM | POA: Diagnosis not present

## 2019-10-10 DIAGNOSIS — U071 COVID-19: Secondary | ICD-10-CM | POA: Diagnosis not present

## 2019-10-10 DIAGNOSIS — N183 Chronic kidney disease, stage 3 unspecified: Secondary | ICD-10-CM | POA: Diagnosis not present

## 2019-10-15 DIAGNOSIS — I1310 Hypertensive heart and chronic kidney disease without heart failure, with stage 1 through stage 4 chronic kidney disease, or unspecified chronic kidney disease: Secondary | ICD-10-CM | POA: Diagnosis not present

## 2019-10-15 DIAGNOSIS — N390 Urinary tract infection, site not specified: Secondary | ICD-10-CM | POA: Diagnosis not present

## 2019-10-15 DIAGNOSIS — U071 COVID-19: Secondary | ICD-10-CM | POA: Diagnosis not present

## 2019-10-15 DIAGNOSIS — N183 Chronic kidney disease, stage 3 unspecified: Secondary | ICD-10-CM | POA: Diagnosis not present

## 2019-10-16 DIAGNOSIS — U071 COVID-19: Secondary | ICD-10-CM | POA: Diagnosis not present

## 2019-10-16 DIAGNOSIS — I1310 Hypertensive heart and chronic kidney disease without heart failure, with stage 1 through stage 4 chronic kidney disease, or unspecified chronic kidney disease: Secondary | ICD-10-CM | POA: Diagnosis not present

## 2019-10-16 DIAGNOSIS — N390 Urinary tract infection, site not specified: Secondary | ICD-10-CM | POA: Diagnosis not present

## 2019-10-16 DIAGNOSIS — N183 Chronic kidney disease, stage 3 unspecified: Secondary | ICD-10-CM | POA: Diagnosis not present

## 2019-10-17 DIAGNOSIS — I1310 Hypertensive heart and chronic kidney disease without heart failure, with stage 1 through stage 4 chronic kidney disease, or unspecified chronic kidney disease: Secondary | ICD-10-CM | POA: Diagnosis not present

## 2019-10-17 DIAGNOSIS — N390 Urinary tract infection, site not specified: Secondary | ICD-10-CM | POA: Diagnosis not present

## 2019-10-17 DIAGNOSIS — N183 Chronic kidney disease, stage 3 unspecified: Secondary | ICD-10-CM | POA: Diagnosis not present

## 2019-10-17 DIAGNOSIS — U071 COVID-19: Secondary | ICD-10-CM | POA: Diagnosis not present

## 2019-10-18 DIAGNOSIS — I1310 Hypertensive heart and chronic kidney disease without heart failure, with stage 1 through stage 4 chronic kidney disease, or unspecified chronic kidney disease: Secondary | ICD-10-CM | POA: Diagnosis not present

## 2019-10-18 DIAGNOSIS — N183 Chronic kidney disease, stage 3 unspecified: Secondary | ICD-10-CM | POA: Diagnosis not present

## 2019-10-18 DIAGNOSIS — U071 COVID-19: Secondary | ICD-10-CM | POA: Diagnosis not present

## 2019-10-18 DIAGNOSIS — N390 Urinary tract infection, site not specified: Secondary | ICD-10-CM | POA: Diagnosis not present

## 2019-10-22 DIAGNOSIS — U071 COVID-19: Secondary | ICD-10-CM | POA: Diagnosis not present

## 2019-10-22 DIAGNOSIS — N390 Urinary tract infection, site not specified: Secondary | ICD-10-CM | POA: Diagnosis not present

## 2019-10-22 DIAGNOSIS — N183 Chronic kidney disease, stage 3 unspecified: Secondary | ICD-10-CM | POA: Diagnosis not present

## 2019-10-22 DIAGNOSIS — I1310 Hypertensive heart and chronic kidney disease without heart failure, with stage 1 through stage 4 chronic kidney disease, or unspecified chronic kidney disease: Secondary | ICD-10-CM | POA: Diagnosis not present

## 2019-10-26 NOTE — Progress Notes (Signed)
Remote pacemaker transmission.   

## 2019-10-29 DIAGNOSIS — N390 Urinary tract infection, site not specified: Secondary | ICD-10-CM | POA: Diagnosis not present

## 2019-10-29 DIAGNOSIS — I1310 Hypertensive heart and chronic kidney disease without heart failure, with stage 1 through stage 4 chronic kidney disease, or unspecified chronic kidney disease: Secondary | ICD-10-CM | POA: Diagnosis not present

## 2019-10-29 DIAGNOSIS — N183 Chronic kidney disease, stage 3 unspecified: Secondary | ICD-10-CM | POA: Diagnosis not present

## 2019-10-29 DIAGNOSIS — U071 COVID-19: Secondary | ICD-10-CM | POA: Diagnosis not present

## 2019-10-30 ENCOUNTER — Other Ambulatory Visit: Payer: Self-pay | Admitting: Cardiovascular Disease

## 2019-10-30 NOTE — Telephone Encounter (Signed)
Rx has been sent to the pharmacy electronically. ° °

## 2019-11-05 ENCOUNTER — Encounter: Payer: Self-pay | Admitting: Family Medicine

## 2019-11-05 MED ORDER — SULFAMETHOXAZOLE-TRIMETHOPRIM 800-160 MG PO TABS: 1.0000 | ORAL_TABLET | Freq: Two times a day (BID) | ORAL | 0 refills | Status: DC

## 2019-11-06 ENCOUNTER — Telehealth: Payer: Self-pay | Admitting: Family Medicine

## 2019-11-06 ENCOUNTER — Other Ambulatory Visit: Payer: Self-pay

## 2019-11-06 ENCOUNTER — Ambulatory Visit (INDEPENDENT_AMBULATORY_CARE_PROVIDER_SITE_OTHER): Payer: Medicare Other | Admitting: Family Medicine

## 2019-11-06 DIAGNOSIS — R5381 Other malaise: Secondary | ICD-10-CM | POA: Diagnosis not present

## 2019-11-06 DIAGNOSIS — N390 Urinary tract infection, site not specified: Secondary | ICD-10-CM | POA: Diagnosis not present

## 2019-11-06 DIAGNOSIS — F0150 Vascular dementia without behavioral disturbance: Secondary | ICD-10-CM | POA: Diagnosis not present

## 2019-11-06 DIAGNOSIS — G4733 Obstructive sleep apnea (adult) (pediatric): Secondary | ICD-10-CM | POA: Diagnosis not present

## 2019-11-06 NOTE — Progress Notes (Signed)
   Subjective:    Patient ID: Julie Bass, female    DOB: Nov 06, 1926, 85 y.o.   MRN: [id number]  HPI Documentation for virtual telephone encounter. Documentation for virtual audio and video telecommunications through [place] encounter: The patient was located at home. The provider was located in the office. The patient did consent to this visit and is aware of possible charges through their insurance for this visit. The other persons participating in this telemedicine service was her daughterThis virtual service is not related to other E/M service within previous 7 days. Her daughter and son are her primary caregivers.  She does have underlying dementia as well as difficulty with incontinence and is using depends.  Recently she has had difficulty with hallucinations, fidgety behavior, sleepiness.  These usually indicate UTI and she was started on Septra yesterday.  We will attempt to get a urine specimen.  She was in the hospital in early November because of the above as well as difficulty with ARF that did clear fairly quickly.  She was also diagnosed with Covid at that time but was essentially asymptomatic.  When I sent her home from the hospital she did have PT as well as nursing to help with bathing and dressing.  She does have an underlying history of OSA but will not do the CPAP.  The daughter is is interested in getting an FMLA for intermittent use.  Right now she gets scared during the day from her grandson and another family member is apparently staying in the house as well.     Review of Systems     Objective:   Physical Exam Alert and in no distress otherwise not examined       Assessment & Plan:  Vascular dementia without behavioral disturbance ([place])  Lower urinary tract infectious disease  OSA (obstructive sleep apnea)  Physical deconditioning We will attempt to get Julie Bass out here to help with bathing and dressing.  They will keep me informed as to how she is  doing with her behavior especially in regard to the antibiotic.  Must need to consider other options if she continues to have difficulty. Approximately 25 minutes spent discussing these issues FMLA form filled out.

## 2019-11-06 NOTE — Telephone Encounter (Signed)
Julie Bass   called [phone]  To let you know that pt/family refused PT this week due to delirium

## 2019-11-14 DIAGNOSIS — U071 COVID-19: Secondary | ICD-10-CM | POA: Diagnosis not present

## 2019-11-14 DIAGNOSIS — I1310 Hypertensive heart and chronic kidney disease without heart failure, with stage 1 through stage 4 chronic kidney disease, or unspecified chronic kidney disease: Secondary | ICD-10-CM | POA: Diagnosis not present

## 2019-11-14 DIAGNOSIS — N390 Urinary tract infection, site not specified: Secondary | ICD-10-CM | POA: Diagnosis not present

## 2019-11-14 DIAGNOSIS — N183 Chronic kidney disease, stage 3 unspecified: Secondary | ICD-10-CM | POA: Diagnosis not present

## 2019-11-16 ENCOUNTER — Other Ambulatory Visit: Payer: Self-pay | Admitting: Cardiovascular Disease

## 2019-11-19 DIAGNOSIS — I1310 Hypertensive heart and chronic kidney disease without heart failure, with stage 1 through stage 4 chronic kidney disease, or unspecified chronic kidney disease: Secondary | ICD-10-CM | POA: Diagnosis not present

## 2019-11-19 DIAGNOSIS — U071 COVID-19: Secondary | ICD-10-CM | POA: Diagnosis not present

## 2019-11-19 DIAGNOSIS — I739 Peripheral vascular disease, unspecified: Secondary | ICD-10-CM | POA: Diagnosis not present

## 2019-11-19 DIAGNOSIS — N183 Chronic kidney disease, stage 3 unspecified: Secondary | ICD-10-CM | POA: Diagnosis not present

## 2019-11-21 DIAGNOSIS — I739 Peripheral vascular disease, unspecified: Secondary | ICD-10-CM | POA: Diagnosis not present

## 2019-11-21 DIAGNOSIS — N183 Chronic kidney disease, stage 3 unspecified: Secondary | ICD-10-CM | POA: Diagnosis not present

## 2019-11-21 DIAGNOSIS — I1310 Hypertensive heart and chronic kidney disease without heart failure, with stage 1 through stage 4 chronic kidney disease, or unspecified chronic kidney disease: Secondary | ICD-10-CM | POA: Diagnosis not present

## 2019-11-21 DIAGNOSIS — U071 COVID-19: Secondary | ICD-10-CM | POA: Diagnosis not present

## 2019-11-26 DIAGNOSIS — U071 COVID-19: Secondary | ICD-10-CM | POA: Diagnosis not present

## 2019-11-26 DIAGNOSIS — N183 Chronic kidney disease, stage 3 unspecified: Secondary | ICD-10-CM | POA: Diagnosis not present

## 2019-11-26 DIAGNOSIS — I739 Peripheral vascular disease, unspecified: Secondary | ICD-10-CM | POA: Diagnosis not present

## 2019-11-26 DIAGNOSIS — I1310 Hypertensive heart and chronic kidney disease without heart failure, with stage 1 through stage 4 chronic kidney disease, or unspecified chronic kidney disease: Secondary | ICD-10-CM | POA: Diagnosis not present

## 2019-11-28 DIAGNOSIS — I739 Peripheral vascular disease, unspecified: Secondary | ICD-10-CM | POA: Diagnosis not present

## 2019-11-28 DIAGNOSIS — I1310 Hypertensive heart and chronic kidney disease without heart failure, with stage 1 through stage 4 chronic kidney disease, or unspecified chronic kidney disease: Secondary | ICD-10-CM | POA: Diagnosis not present

## 2019-11-28 DIAGNOSIS — U071 COVID-19: Secondary | ICD-10-CM | POA: Diagnosis not present

## 2019-11-28 DIAGNOSIS — N183 Chronic kidney disease, stage 3 unspecified: Secondary | ICD-10-CM | POA: Diagnosis not present

## 2019-11-30 DIAGNOSIS — I1310 Hypertensive heart and chronic kidney disease without heart failure, with stage 1 through stage 4 chronic kidney disease, or unspecified chronic kidney disease: Secondary | ICD-10-CM | POA: Diagnosis not present

## 2019-11-30 DIAGNOSIS — U071 COVID-19: Secondary | ICD-10-CM | POA: Diagnosis not present

## 2019-11-30 DIAGNOSIS — N183 Chronic kidney disease, stage 3 unspecified: Secondary | ICD-10-CM | POA: Diagnosis not present

## 2019-11-30 DIAGNOSIS — I739 Peripheral vascular disease, unspecified: Secondary | ICD-10-CM | POA: Diagnosis not present

## 2019-12-02 ENCOUNTER — Other Ambulatory Visit: Payer: Self-pay | Admitting: Cardiovascular Disease

## 2019-12-03 ENCOUNTER — Telehealth: Payer: Self-pay

## 2019-12-03 DIAGNOSIS — U071 COVID-19: Secondary | ICD-10-CM | POA: Diagnosis not present

## 2019-12-03 DIAGNOSIS — I739 Peripheral vascular disease, unspecified: Secondary | ICD-10-CM | POA: Diagnosis not present

## 2019-12-03 DIAGNOSIS — N183 Chronic kidney disease, stage 3 unspecified: Secondary | ICD-10-CM | POA: Diagnosis not present

## 2019-12-03 DIAGNOSIS — I1310 Hypertensive heart and chronic kidney disease without heart failure, with stage 1 through stage 4 chronic kidney disease, or unspecified chronic kidney disease: Secondary | ICD-10-CM | POA: Diagnosis not present

## 2019-12-03 NOTE — Telephone Encounter (Signed)
Called pt daughter need some info for pt form [facility]

## 2019-12-04 NOTE — Telephone Encounter (Signed)
Rx(s) sent to pharmacy electronically.  

## 2019-12-05 DIAGNOSIS — U071 COVID-19: Secondary | ICD-10-CM | POA: Diagnosis not present

## 2019-12-05 DIAGNOSIS — I1310 Hypertensive heart and chronic kidney disease without heart failure, with stage 1 through stage 4 chronic kidney disease, or unspecified chronic kidney disease: Secondary | ICD-10-CM | POA: Diagnosis not present

## 2019-12-05 DIAGNOSIS — I739 Peripheral vascular disease, unspecified: Secondary | ICD-10-CM | POA: Diagnosis not present

## 2019-12-05 DIAGNOSIS — N183 Chronic kidney disease, stage 3 unspecified: Secondary | ICD-10-CM | POA: Diagnosis not present

## 2019-12-07 ENCOUNTER — Other Ambulatory Visit (INDEPENDENT_AMBULATORY_CARE_PROVIDER_SITE_OTHER): Payer: Medicare Other

## 2019-12-07 ENCOUNTER — Other Ambulatory Visit: Payer: Self-pay

## 2019-12-07 ENCOUNTER — Telehealth: Payer: Self-pay

## 2019-12-07 DIAGNOSIS — I739 Peripheral vascular disease, unspecified: Secondary | ICD-10-CM | POA: Diagnosis not present

## 2019-12-07 DIAGNOSIS — U071 COVID-19: Secondary | ICD-10-CM | POA: Diagnosis not present

## 2019-12-07 DIAGNOSIS — N390 Urinary tract infection, site not specified: Secondary | ICD-10-CM

## 2019-12-07 DIAGNOSIS — N3000 Acute cystitis without hematuria: Secondary | ICD-10-CM

## 2019-12-07 DIAGNOSIS — N183 Chronic kidney disease, stage 3 unspecified: Secondary | ICD-10-CM | POA: Diagnosis not present

## 2019-12-07 DIAGNOSIS — I1310 Hypertensive heart and chronic kidney disease without heart failure, with stage 1 through stage 4 chronic kidney disease, or unspecified chronic kidney disease: Secondary | ICD-10-CM | POA: Diagnosis not present

## 2019-12-07 LAB — POCT URINALYSIS DIP (PROADVANTAGE DEVICE)
Bilirubin, UA: NEGATIVE
Blood, UA: NEGATIVE
Glucose, UA: NEGATIVE mg/dL
Ketones, POC UA: NEGATIVE mg/dL
Leukocytes, UA: NEGATIVE
Nitrite, UA: NEGATIVE
Protein Ur, POC: NEGATIVE mg/dL
Specific Gravity, Urine: 1.015
Urobilinogen, Ur: 0.2
pH, UA: 6.5 (ref 5.0–8.0)

## 2019-12-07 NOTE — Telephone Encounter (Signed)
Good.  Leave her alone

## 2019-12-07 NOTE — Telephone Encounter (Signed)
Daughter was advised . [place]

## 2019-12-07 NOTE — Telephone Encounter (Signed)
Pt just finished abx an her U/A results are normal. Please advise. [place]

## 2019-12-10 ENCOUNTER — Ambulatory Visit: Payer: Self-pay | Admitting: Family Medicine

## 2019-12-10 DIAGNOSIS — N183 Chronic kidney disease, stage 3 unspecified: Secondary | ICD-10-CM | POA: Diagnosis not present

## 2019-12-10 DIAGNOSIS — I1310 Hypertensive heart and chronic kidney disease without heart failure, with stage 1 through stage 4 chronic kidney disease, or unspecified chronic kidney disease: Secondary | ICD-10-CM | POA: Diagnosis not present

## 2019-12-10 DIAGNOSIS — I739 Peripheral vascular disease, unspecified: Secondary | ICD-10-CM | POA: Diagnosis not present

## 2019-12-10 DIAGNOSIS — U071 COVID-19: Secondary | ICD-10-CM | POA: Diagnosis not present

## 2019-12-12 DIAGNOSIS — N183 Chronic kidney disease, stage 3 unspecified: Secondary | ICD-10-CM | POA: Diagnosis not present

## 2019-12-12 DIAGNOSIS — U071 COVID-19: Secondary | ICD-10-CM | POA: Diagnosis not present

## 2019-12-12 DIAGNOSIS — I1310 Hypertensive heart and chronic kidney disease without heart failure, with stage 1 through stage 4 chronic kidney disease, or unspecified chronic kidney disease: Secondary | ICD-10-CM | POA: Diagnosis not present

## 2019-12-12 DIAGNOSIS — I739 Peripheral vascular disease, unspecified: Secondary | ICD-10-CM | POA: Diagnosis not present

## 2019-12-13 DIAGNOSIS — I1310 Hypertensive heart and chronic kidney disease without heart failure, with stage 1 through stage 4 chronic kidney disease, or unspecified chronic kidney disease: Secondary | ICD-10-CM | POA: Diagnosis not present

## 2019-12-13 DIAGNOSIS — U071 COVID-19: Secondary | ICD-10-CM | POA: Diagnosis not present

## 2019-12-13 DIAGNOSIS — I739 Peripheral vascular disease, unspecified: Secondary | ICD-10-CM | POA: Diagnosis not present

## 2019-12-13 DIAGNOSIS — N183 Chronic kidney disease, stage 3 unspecified: Secondary | ICD-10-CM | POA: Diagnosis not present

## 2019-12-17 DIAGNOSIS — U071 COVID-19: Secondary | ICD-10-CM | POA: Diagnosis not present

## 2019-12-17 DIAGNOSIS — N183 Chronic kidney disease, stage 3 unspecified: Secondary | ICD-10-CM | POA: Diagnosis not present

## 2019-12-17 DIAGNOSIS — I1310 Hypertensive heart and chronic kidney disease without heart failure, with stage 1 through stage 4 chronic kidney disease, or unspecified chronic kidney disease: Secondary | ICD-10-CM | POA: Diagnosis not present

## 2019-12-17 DIAGNOSIS — I739 Peripheral vascular disease, unspecified: Secondary | ICD-10-CM | POA: Diagnosis not present

## 2019-12-25 ENCOUNTER — Other Ambulatory Visit: Payer: Self-pay | Admitting: Cardiovascular Disease

## 2019-12-25 NOTE — Telephone Encounter (Signed)
Rx has been sent to the pharmacy electronically. ° °

## 2019-12-31 ENCOUNTER — Ambulatory Visit (INDEPENDENT_AMBULATORY_CARE_PROVIDER_SITE_OTHER): Payer: Medicare Other | Admitting: *Deleted

## 2019-12-31 DIAGNOSIS — I442 Atrioventricular block, complete: Secondary | ICD-10-CM

## 2020-01-01 LAB — CUP PACEART REMOTE DEVICE CHECK
Date Time Interrogation Session: 20210216061628
Implantable Lead Implant Date: 20190225
Implantable Lead Implant Date: 20190225
Implantable Lead Location: 753859
Implantable Lead Location: 753860
Implantable Lead Model: 377
Implantable Lead Model: 377
Implantable Lead Serial Number: 80514208
Implantable Lead Serial Number: 80598782
Implantable Pulse Generator Implant Date: 20190225
Pulse Gen Model: 407145
Pulse Gen Serial Number: 69245949

## 2020-01-01 NOTE — Progress Notes (Signed)
PPM Remote  

## 2020-02-14 ENCOUNTER — Other Ambulatory Visit: Payer: Self-pay | Admitting: *Deleted

## 2020-02-14 MED ORDER — SULFAMETHOXAZOLE-TRIMETHOPRIM 800-160 MG PO TABS: 1.0000 | ORAL_TABLET | Freq: Two times a day (BID) | ORAL | 0 refills | Status: DC

## 2020-02-19 ENCOUNTER — Ambulatory Visit: Payer: Medicare Other | Admitting: Podiatry

## 2020-02-19 ENCOUNTER — Other Ambulatory Visit: Payer: Self-pay

## 2020-02-19 DIAGNOSIS — B351 Tinea unguium: Secondary | ICD-10-CM | POA: Diagnosis not present

## 2020-02-19 DIAGNOSIS — M79676 Pain in unspecified toe(s): Secondary | ICD-10-CM

## 2020-02-19 NOTE — Patient Instructions (Addendum)

## 2020-02-19 NOTE — Progress Notes (Signed)
  Subjective:  Patient ID: Julie Bass, female    DOB: 22-Sep-1926,  MRN: [id number]  No chief complaint on file.   86 y.o. female presents with the above complaint. History confirmed with patient.  States that her granddaughter stepped on the left great toenail and the nail started to come off reports some blood underneath it states that all of her nails were thick deformed and painful in the coming  Objective:  Physical Exam: warm, good capillary refill, nail exam onychomycosis of the toenails -left hallux nail with partial lysis subungual hemorrhage, no trophic changes or ulcerative lesions. DP pulses palpable, PT pulses palpable and protective sensation intact Left Foot: normal exam, no swelling, tenderness, instability; ligaments intact, full range of motion of all ankle/foot joints  Right Foot: normal exam, no swelling, tenderness, instability; ligaments intact, full range of motion of all ankle/foot joints   No images are attached to the encounter.  Assessment:   1. Pain due to onychomycosis of nail      Plan:  Patient was evaluated and treated and all questions answered.  Onycholysis -Nail further debrided  Onychomycosis -Nails debrided due to pain  No follow-ups on file.

## 2020-03-29 ENCOUNTER — Other Ambulatory Visit: Payer: Self-pay | Admitting: Cardiovascular Disease

## 2020-03-31 ENCOUNTER — Ambulatory Visit (INDEPENDENT_AMBULATORY_CARE_PROVIDER_SITE_OTHER): Payer: Medicare Other | Admitting: *Deleted

## 2020-03-31 DIAGNOSIS — I442 Atrioventricular block, complete: Secondary | ICD-10-CM | POA: Diagnosis not present

## 2020-03-31 LAB — CUP PACEART REMOTE DEVICE CHECK
Date Time Interrogation Session: 20210517103137
Implantable Lead Implant Date: 20190225
Implantable Lead Implant Date: 20190225
Implantable Lead Location: 753859
Implantable Lead Location: 753860
Implantable Lead Model: 377
Implantable Lead Model: 377
Implantable Lead Serial Number: 80514208
Implantable Lead Serial Number: 80598782
Implantable Pulse Generator Implant Date: 20190225
Pulse Gen Model: 407145
Pulse Gen Serial Number: 69245949

## 2020-04-01 NOTE — Progress Notes (Signed)
Remote pacemaker transmission.   

## 2020-05-28 ENCOUNTER — Encounter (HOSPITAL_COMMUNITY): Payer: Self-pay

## 2020-05-28 ENCOUNTER — Inpatient Hospital Stay (HOSPITAL_COMMUNITY)
Admission: EM | Admit: 2020-05-28 | Discharge: 2020-06-01 | DRG: 078 | Disposition: A | Discharge status: Home Health | Payer: Medicare Other | Attending: Internal Medicine | Admitting: Internal Medicine

## 2020-05-28 DIAGNOSIS — I160 Hypertensive urgency: Secondary | ICD-10-CM | POA: Diagnosis not present

## 2020-05-28 DIAGNOSIS — I739 Peripheral vascular disease, unspecified: Secondary | ICD-10-CM | POA: Diagnosis not present

## 2020-05-28 DIAGNOSIS — M810 Age-related osteoporosis without current pathological fracture: Secondary | ICD-10-CM | POA: Diagnosis present

## 2020-05-28 DIAGNOSIS — G9341 Metabolic encephalopathy: Secondary | ICD-10-CM | POA: Diagnosis present

## 2020-05-28 DIAGNOSIS — Z803 Family history of malignant neoplasm of breast: Secondary | ICD-10-CM

## 2020-05-28 DIAGNOSIS — I674 Hypertensive encephalopathy: Secondary | ICD-10-CM | POA: Diagnosis not present

## 2020-05-28 DIAGNOSIS — Z96611 Presence of right artificial shoulder joint: Secondary | ICD-10-CM | POA: Diagnosis present

## 2020-05-28 DIAGNOSIS — R1084 Generalized abdominal pain: Secondary | ICD-10-CM | POA: Diagnosis not present

## 2020-05-28 DIAGNOSIS — E876 Hypokalemia: Secondary | ICD-10-CM | POA: Diagnosis not present

## 2020-05-28 DIAGNOSIS — H409 Unspecified glaucoma: Secondary | ICD-10-CM | POA: Diagnosis not present

## 2020-05-28 DIAGNOSIS — N1831 Chronic kidney disease, stage 3a: Secondary | ICD-10-CM | POA: Diagnosis present

## 2020-05-28 DIAGNOSIS — Z800 Family history of malignant neoplasm of digestive organs: Secondary | ICD-10-CM | POA: Diagnosis not present

## 2020-05-28 DIAGNOSIS — R4182 Altered mental status, unspecified: Secondary | ICD-10-CM

## 2020-05-28 DIAGNOSIS — Z8249 Family history of ischemic heart disease and other diseases of the circulatory system: Secondary | ICD-10-CM

## 2020-05-28 DIAGNOSIS — Z950 Presence of cardiac pacemaker: Secondary | ICD-10-CM

## 2020-05-28 DIAGNOSIS — Z79899 Other long term (current) drug therapy: Secondary | ICD-10-CM

## 2020-05-28 DIAGNOSIS — Z87891 Personal history of nicotine dependence: Secondary | ICD-10-CM

## 2020-05-28 DIAGNOSIS — G9389 Other specified disorders of brain: Secondary | ICD-10-CM | POA: Diagnosis not present

## 2020-05-28 DIAGNOSIS — Z66 Do not resuscitate: Secondary | ICD-10-CM | POA: Diagnosis present

## 2020-05-28 DIAGNOSIS — N179 Acute kidney failure, unspecified: Secondary | ICD-10-CM | POA: Diagnosis not present

## 2020-05-28 DIAGNOSIS — Q048 Other specified congenital malformations of brain: Secondary | ICD-10-CM | POA: Diagnosis not present

## 2020-05-28 DIAGNOSIS — G9340 Encephalopathy, unspecified: Secondary | ICD-10-CM | POA: Diagnosis not present

## 2020-05-28 DIAGNOSIS — I517 Cardiomegaly: Secondary | ICD-10-CM | POA: Diagnosis not present

## 2020-05-28 DIAGNOSIS — M1990 Unspecified osteoarthritis, unspecified site: Secondary | ICD-10-CM | POA: Diagnosis present

## 2020-05-28 DIAGNOSIS — E785 Hyperlipidemia, unspecified: Secondary | ICD-10-CM | POA: Diagnosis present

## 2020-05-28 DIAGNOSIS — Z808 Family history of malignant neoplasm of other organs or systems: Secondary | ICD-10-CM

## 2020-05-28 DIAGNOSIS — Z8049 Family history of malignant neoplasm of other genital organs: Secondary | ICD-10-CM

## 2020-05-28 DIAGNOSIS — G4733 Obstructive sleep apnea (adult) (pediatric): Secondary | ICD-10-CM | POA: Diagnosis present

## 2020-05-28 DIAGNOSIS — Z888 Allergy status to other drugs, medicaments and biological substances status: Secondary | ICD-10-CM

## 2020-05-28 DIAGNOSIS — I10 Essential (primary) hypertension: Secondary | ICD-10-CM | POA: Diagnosis not present

## 2020-05-28 DIAGNOSIS — G319 Degenerative disease of nervous system, unspecified: Secondary | ICD-10-CM | POA: Diagnosis not present

## 2020-05-28 DIAGNOSIS — K219 Gastro-esophageal reflux disease without esophagitis: Secondary | ICD-10-CM | POA: Diagnosis not present

## 2020-05-28 DIAGNOSIS — Z03818 Encounter for observation for suspected exposure to other biological agents ruled out: Secondary | ICD-10-CM | POA: Diagnosis not present

## 2020-05-28 DIAGNOSIS — F0390 Unspecified dementia without behavioral disturbance: Secondary | ICD-10-CM | POA: Diagnosis present

## 2020-05-28 DIAGNOSIS — K449 Diaphragmatic hernia without obstruction or gangrene: Secondary | ICD-10-CM | POA: Diagnosis not present

## 2020-05-28 DIAGNOSIS — E872 Acidosis, unspecified: Secondary | ICD-10-CM | POA: Diagnosis present

## 2020-05-28 DIAGNOSIS — Z20822 Contact with and (suspected) exposure to covid-19: Secondary | ICD-10-CM | POA: Diagnosis present

## 2020-05-28 DIAGNOSIS — Z7902 Long term (current) use of antithrombotics/antiplatelets: Secondary | ICD-10-CM

## 2020-05-28 LAB — COMPREHENSIVE METABOLIC PANEL
ALT: 24 U/L (ref 0–44)
AST: 34 U/L (ref 15–41)
Albumin: 4.6 g/dL (ref 3.5–5.0)
Alkaline Phosphatase: 40 U/L (ref 38–126)
Anion gap: 12 (ref 5–15)
BUN: 14 mg/dL (ref 8–23)
CO2: 21 mmol/L — ABNORMAL LOW (ref 22–32)
Calcium: 10.6 mg/dL — ABNORMAL HIGH (ref 8.9–10.3)
Chloride: 105 mmol/L (ref 98–111)
Creatinine, Ser: 1.43 mg/dL — ABNORMAL HIGH (ref 0.44–1.00)
GFR calc Af Amer: 37 mL/min — ABNORMAL LOW (ref >60)
GFR calc non Af Amer: 32 mL/min — ABNORMAL LOW (ref >60)
Glucose, Bld: 160 mg/dL — ABNORMAL HIGH (ref 70–99)
Potassium: 3.8 mmol/L (ref 3.5–5.1)
Sodium: 138 mmol/L (ref 135–145)
Total Bilirubin: 1.1 mg/dL (ref 0.3–1.2)
Total Protein: 7.8 g/dL (ref 6.5–8.1)

## 2020-05-28 LAB — CBC
HCT: 46.9 % — ABNORMAL HIGH (ref 36.0–46.0)
Hemoglobin: 15.4 g/dL — ABNORMAL HIGH (ref 12.0–15.0)
MCH: 31.6 pg (ref 26.0–34.0)
MCHC: 32.8 g/dL (ref 30.0–36.0)
MCV: 96.1 fL (ref 80.0–100.0)
Platelets: 251 10*3/uL (ref 150–400)
RBC: 4.88 MIL/uL (ref 3.87–5.11)
RDW: 13.2 % (ref 11.5–15.5)
WBC: 8.3 10*3/uL (ref 4.0–10.5)
nRBC: 0 % (ref 0.0–0.2)

## 2020-05-28 LAB — LIPASE, BLOOD: Lipase: 33 U/L (ref 11–51)

## 2020-05-28 NOTE — ED Triage Notes (Signed)
Pt BIB by son-in-law for eval of UTI-like symptoms. Son reports increased confusion, agitation and hallucinations. States she frequently gets those and is acting similarly.

## 2020-05-29 ENCOUNTER — Encounter (HOSPITAL_COMMUNITY): Payer: Self-pay | Admitting: Internal Medicine

## 2020-05-29 ENCOUNTER — Emergency Department (HOSPITAL_COMMUNITY): Payer: Medicare Other

## 2020-05-29 DIAGNOSIS — E872 Acidosis, unspecified: Secondary | ICD-10-CM | POA: Diagnosis present

## 2020-05-29 DIAGNOSIS — G9341 Metabolic encephalopathy: Secondary | ICD-10-CM

## 2020-05-29 DIAGNOSIS — I160 Hypertensive urgency: Secondary | ICD-10-CM | POA: Diagnosis present

## 2020-05-29 LAB — URINALYSIS, ROUTINE W REFLEX MICROSCOPIC
Bacteria, UA: NONE SEEN
Bilirubin Urine: NEGATIVE
Glucose, UA: NEGATIVE mg/dL
Hgb urine dipstick: NEGATIVE
Ketones, ur: NEGATIVE mg/dL
Leukocytes,Ua: NEGATIVE
Nitrite: NEGATIVE
Protein, ur: 100 mg/dL — AB
Specific Gravity, Urine: 1.016 (ref 1.005–1.030)
pH: 5 (ref 5.0–8.0)

## 2020-05-29 LAB — COMPREHENSIVE METABOLIC PANEL
ALT: 24 U/L (ref 0–44)
AST: 41 U/L (ref 15–41)
Albumin: 4.3 g/dL (ref 3.5–5.0)
Alkaline Phosphatase: 38 U/L (ref 38–126)
Anion gap: 13 (ref 5–15)
BUN: 11 mg/dL (ref 8–23)
CO2: 19 mmol/L — ABNORMAL LOW (ref 22–32)
Calcium: 9.8 mg/dL (ref 8.9–10.3)
Chloride: 107 mmol/L (ref 98–111)
Creatinine, Ser: 1.41 mg/dL — ABNORMAL HIGH (ref 0.44–1.00)
GFR calc Af Amer: 37 mL/min — ABNORMAL LOW (ref >60)
GFR calc non Af Amer: 32 mL/min — ABNORMAL LOW (ref >60)
Glucose, Bld: 135 mg/dL — ABNORMAL HIGH (ref 70–99)
Potassium: 3.2 mmol/L — ABNORMAL LOW (ref 3.5–5.1)
Sodium: 139 mmol/L (ref 135–145)
Total Bilirubin: 1.2 mg/dL (ref 0.3–1.2)
Total Protein: 7.4 g/dL (ref 6.5–8.1)

## 2020-05-29 LAB — CBC WITH DIFFERENTIAL/PLATELET
Abs Immature Granulocytes: 0.04 10*3/uL (ref 0.00–0.07)
Basophils Absolute: 0.1 10*3/uL (ref 0.0–0.1)
Basophils Relative: 1 %
Eosinophils Absolute: 0 10*3/uL (ref 0.0–0.5)
Eosinophils Relative: 0 %
HCT: 47.7 % — ABNORMAL HIGH (ref 36.0–46.0)
Hemoglobin: 15.7 g/dL — ABNORMAL HIGH (ref 12.0–15.0)
Immature Granulocytes: 0 %
Lymphocytes Relative: 15 %
Lymphs Abs: 1.7 10*3/uL (ref 0.7–4.0)
MCH: 31.6 pg (ref 26.0–34.0)
MCHC: 32.9 g/dL (ref 30.0–36.0)
MCV: 96 fL (ref 80.0–100.0)
Monocytes Absolute: 1.1 10*3/uL — ABNORMAL HIGH (ref 0.1–1.0)
Monocytes Relative: 10 %
Neutro Abs: 8.1 10*3/uL — ABNORMAL HIGH (ref 1.7–7.7)
Neutrophils Relative %: 74 %
Platelets: 206 10*3/uL (ref 150–400)
RBC: 4.97 MIL/uL (ref 3.87–5.11)
RDW: 13.3 % (ref 11.5–15.5)
WBC: 10.9 10*3/uL — ABNORMAL HIGH (ref 4.0–10.5)
nRBC: 0 % (ref 0.0–0.2)

## 2020-05-29 LAB — MAGNESIUM: Magnesium: 1.6 mg/dL — ABNORMAL LOW (ref 1.7–2.4)

## 2020-05-29 LAB — SARS CORONAVIRUS 2 BY RT PCR (HOSPITAL ORDER, PERFORMED IN ~~LOC~~ HOSPITAL LAB): SARS Coronavirus 2: NEGATIVE

## 2020-05-29 LAB — VITAMIN B12: Vitamin B-12: 883 pg/mL (ref 180–914)

## 2020-05-29 LAB — LACTIC ACID, PLASMA
Lactic Acid, Venous: 4.1 mmol/L (ref 0.5–1.9)
Lactic Acid, Venous: 3.9 mmol/L (ref 0.5–1.9)
Lactic Acid, Venous: 4.3 mmol/L (ref 0.5–1.9)

## 2020-05-29 LAB — TSH: TSH: 1.974 u[IU]/mL (ref 0.350–4.500)

## 2020-05-29 LAB — FOLATE: Folate: UNDETERMINED ng/mL (ref >5.9)

## 2020-05-29 LAB — C-REACTIVE PROTEIN: CRP: <0.5 mg/dL (ref <1.0)

## 2020-05-29 LAB — AMMONIA: Ammonia: 36 umol/L — ABNORMAL HIGH (ref 9–35)

## 2020-05-29 MED ORDER — SODIUM CHLORIDE 0.9 % IV SOLN
2.0000 g | Freq: Once | INTRAVENOUS | Status: AC
  Administered 2020-05-29: 2 g via INTRAVENOUS
  Filled 2020-05-29: qty 2

## 2020-05-29 MED ORDER — ACETAMINOPHEN 325 MG PO TABS: 650.0000 mg | ORAL_TABLET | Freq: Four times a day (QID) | ORAL | Status: DC | PRN

## 2020-05-29 MED ORDER — CILOSTAZOL 50 MG PO TABS
50.0000 mg | ORAL_TABLET | Freq: Two times a day (BID) | ORAL | Status: DC
  Administered 2020-05-29 – 2020-06-01 (×7): 50 mg via ORAL
  Filled 2020-05-29 (×9): qty 1

## 2020-05-29 MED ORDER — ACETAMINOPHEN 650 MG RE SUPP: 650.0000 mg | Freq: Four times a day (QID) | RECTAL | Status: DC | PRN

## 2020-05-29 MED ORDER — ELDERTONIC PO LIQD
15.0000 mL | Freq: Every day | ORAL | Status: DC
  Administered 2020-05-29 – 2020-06-01 (×3): 15 mL via ORAL
  Filled 2020-05-29 (×4): qty 15

## 2020-05-29 MED ORDER — ENOXAPARIN SODIUM 30 MG/0.3ML ~~LOC~~ SOLN
30.0000 mg | SUBCUTANEOUS | Status: DC
  Administered 2020-05-29 – 2020-06-01 (×4): 30 mg via SUBCUTANEOUS
  Filled 2020-05-29 (×4): qty 0.3

## 2020-05-29 MED ORDER — OMEGA-3-ACID ETHYL ESTERS 1 G PO CAPS
1.0000 g | ORAL_CAPSULE | Freq: Every evening | ORAL | Status: DC
  Administered 2020-05-30 – 2020-05-31 (×2): 1 g via ORAL
  Filled 2020-05-29 (×3): qty 1

## 2020-05-29 MED ORDER — LACTATED RINGERS IV SOLN: INTRAVENOUS | Status: AC

## 2020-05-29 MED ORDER — SODIUM CHLORIDE 0.9 % IV SOLN
1.0000 g | INTRAVENOUS | Status: DC
  Administered 2020-05-29 – 2020-06-01 (×4): 1 g via INTRAVENOUS
  Filled 2020-05-29 (×2): qty 1
  Filled 2020-05-29: qty 10
  Filled 2020-05-29: qty 1

## 2020-05-29 MED ORDER — CITALOPRAM HYDROBROMIDE 20 MG PO TABS
10.0000 mg | ORAL_TABLET | Freq: Every day | ORAL | Status: DC
  Administered 2020-05-29 – 2020-06-01 (×4): 10 mg via ORAL
  Filled 2020-05-29 (×4): qty 1

## 2020-05-29 MED ORDER — PANTOPRAZOLE SODIUM 40 MG PO TBEC
40.0000 mg | DELAYED_RELEASE_TABLET | Freq: Every day | ORAL | Status: DC
  Administered 2020-05-29 – 2020-06-01 (×4): 40 mg via ORAL
  Filled 2020-05-29 (×4): qty 1

## 2020-05-29 MED ORDER — ONDANSETRON HCL 4 MG/2ML IJ SOLN
4.0000 mg | Freq: Four times a day (QID) | INTRAMUSCULAR | Status: DC | PRN
  Administered 2020-05-30: 4 mg via INTRAVENOUS
  Filled 2020-05-29: qty 2

## 2020-05-29 MED ORDER — HYDRALAZINE HCL 20 MG/ML IJ SOLN
10.0000 mg | Freq: Four times a day (QID) | INTRAMUSCULAR | Status: DC | PRN
  Administered 2020-05-29: 10 mg via INTRAVENOUS
  Filled 2020-05-29: qty 1

## 2020-05-29 MED ORDER — LACTATED RINGERS IV BOLUS (SEPSIS): 500.0000 mL | Freq: Once | INTRAVENOUS | Status: DC

## 2020-05-29 MED ORDER — LACTATED RINGERS IV BOLUS (SEPSIS)
1000.0000 mL | Freq: Once | INTRAVENOUS | Status: AC
  Administered 2020-05-29: 1000 mL via INTRAVENOUS

## 2020-05-29 MED ORDER — LACTATED RINGERS IV BOLUS (SEPSIS): 250.0000 mL | Freq: Once | INTRAVENOUS | Status: DC

## 2020-05-29 MED ORDER — ENSURE ENLIVE PO LIQD
237.0000 mL | Freq: Three times a day (TID) | ORAL | Status: DC
  Administered 2020-05-29 – 2020-06-01 (×6): 237 mL via ORAL
  Filled 2020-05-29: qty 237

## 2020-05-29 MED ORDER — VANCOMYCIN HCL IN DEXTROSE 1-5 GM/200ML-% IV SOLN: 1000.0000 mg | Freq: Once | INTRAVENOUS | Status: DC

## 2020-05-29 MED ORDER — POLYETHYLENE GLYCOL 3350 17 G PO PACK: 17.0000 g | PACK | Freq: Every day | ORAL | Status: DC | PRN

## 2020-05-29 MED ORDER — METOPROLOL SUCCINATE ER 100 MG PO TB24
100.0000 mg | ORAL_TABLET | Freq: Every day | ORAL | Status: DC
  Administered 2020-05-29: 100 mg via ORAL
  Filled 2020-05-29: qty 1

## 2020-05-29 MED ORDER — HYDRALAZINE HCL 20 MG/ML IJ SOLN
10.0000 mg | INTRAMUSCULAR | Status: DC | PRN
  Administered 2020-05-29 – 2020-06-01 (×4): 10 mg via INTRAVENOUS
  Filled 2020-05-29 (×4): qty 1

## 2020-05-29 MED ORDER — METRONIDAZOLE IN NACL 5-0.79 MG/ML-% IV SOLN
500.0000 mg | Freq: Once | INTRAVENOUS | Status: AC
  Administered 2020-05-29: 500 mg via INTRAVENOUS
  Filled 2020-05-29: qty 100

## 2020-05-29 MED ORDER — ONDANSETRON HCL 4 MG PO TABS: 4.0000 mg | ORAL_TABLET | Freq: Four times a day (QID) | ORAL | Status: DC | PRN

## 2020-05-29 MED ORDER — EZETIMIBE 10 MG PO TABS
10.0000 mg | ORAL_TABLET | Freq: Every day | ORAL | Status: DC
  Administered 2020-05-29 – 2020-06-01 (×4): 10 mg via ORAL
  Filled 2020-05-29 (×4): qty 1

## 2020-05-29 MED ORDER — TIMOLOL MALEATE 0.5 % OP SOLN
1.0000 [drp] | Freq: Two times a day (BID) | OPHTHALMIC | Status: DC
  Administered 2020-05-29 – 2020-06-01 (×7): 1 [drp] via OPHTHALMIC
  Filled 2020-05-29 (×4): qty 5

## 2020-05-29 MED ORDER — SODIUM CHLORIDE 0.9 % IV BOLUS
500.0000 mL | Freq: Once | INTRAVENOUS | Status: AC
  Administered 2020-05-29: 500 mL via INTRAVENOUS

## 2020-05-29 NOTE — ED Provider Notes (Addendum)
[facility] EMERGENCY DEPARTMENT Provider Note   CSN: [id number] Arrival date & time: 05/28/20  1956     History Chief Complaint  Patient presents with  . Urinary Tract Infection    Julie Bass is a 86 y.o. female.  The history is provided by the patient and medical records.  Urinary Tract Infection   Level 5 caveat: Altered mental status  86 year old female with history of arthritis, chronic kidney disease, dementia, depression, GERD, hyperlipidemia, glaucoma, PAD, presenting to the ED with altered mental status.  Son-in-law present at bedside providing history.  States of the past 24 to 36 hours patient has been confused, agitated, and hallucinating.  States she has been "talking out of her head" including talking to family members who have been dead for quite some time.  States she has been picking at and fidgeting with her clothing.  States this is typically what she does when she has a UTI.  She does tend to get UTI's every 4-6 months or so.  She is intermittently incontinent, wears depends most days.  No noted fever, cough, or URI symptoms.  Family has given her 3 tabs of left over bactrim already without change.  Past Medical History:  Diagnosis Date  . Arthritis   . Chronic kidney disease    KIDNEY STONES  . Dementia ([place])    BEGINNING STAGES   . Depression   . GERD (gastroesophageal reflux disease)   . Glaucoma   . Hiatal hernia   . HTN (hypertension) 10/18/2011  . Hyperlipemia 10/18/2011  . NSVT (nonsustained ventricular tachycardia) ([place]) 10/18/2011  . Osteoporosis   . PAD (peripheral artery disease) ([place]) 10/18/2011   h/o left SFA stent    Patient Active Problem List   Diagnosis Date Noted  . Hiatal hernia 09/18/2019  . Peripheral artery disease ([place]) 09/18/2019  . Essential hypertension 09/18/2019  . Neck mass 09/18/2019  . Weakness 09/17/2019  . Pacemaker 05/28/2019  . Bilateral sensorineural hearing loss 05/09/2018  . CHB (complete  heart block) ([place]) 01/20/2018  . OSA (obstructive sleep apnea) 01/20/2018  . 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 GERD (gastroesophageal reflux disease) 01/08/2018  . CKD (chronic kidney disease), stage III 01/07/2018  . Lower urinary tract infectious disease 06/16/2016  . Warthin's tumor 01/01/2016  . Dementia ([place]) 03/14/2015  . Orthostatic hypotension 08/27/2014  . Physical deconditioning 08/27/2014  . Abdominal pain in female 08/27/2014  . Osteoarthrosis, unspecified whether generalized or localized, shoulder region 01/26/2013  . Chest wall pain 12/26/2012  . NSVT (nonsustained ventricular tachycardia) ([place]) 10/18/2011  . HTN (hypertension) 10/18/2011  . HLD (hyperlipidemia) 10/18/2011  . PAD (peripheral artery disease) ([place]) 10/18/2011  . Syncope 09/30/2011  . Pelvic joint pain 09/30/2011  . Back pain 09/30/2011    Past Surgical History:  Procedure Laterality Date  . ABDOMINAL HYSTERECTOMY  1973  . CARDIAC CATHETERIZATION     2012  . CHOLECYSTECTOMY    . LOOP RECORDER IMPLANT N/A 08/27/2014   Procedure: LOOP RECORDER IMPLANT;  Surgeon: Coralyn Mark, MD;  Location: [facility];  Service: Cardiovascular;  Laterality: N/A;  . LOOP RECORDER REMOVAL N/A 01/09/2018   Procedure: LOOP RECORDER REMOVAL;  Surgeon: Evans Lance, MD;  Location: [facility];  Service: Cardiovascular;  Laterality: N/A;  . LOWER EXTREMITY ANGIOGRAM  11/02/2007   stent of mid left SFA with 6x177mm EV3 self-expanding stent and 6x3 in prox region (Dr. Adora Fridge)  . LOWER EXTREMITY ANGIOGRAM    . LOWER  EXTREMITY ARTERIAL DOPPLER  2013   right SFA with 50-69% diameter reduction, right PTA/peroneal occluded, L SFA prox to stent has narrowing with increased velocities >60% diameter reduction, L SFA stent patent, L ATA w/occlusive disease, bilat ABIs show mild arterial insuffiency at rest  . NM MYOCAR PERF WALL MOTION  10/2011   non-gated - normal stuy, EF 82%, normal LV wall motion  . PACEMAKER IMPLANT  N/A 01/09/2018   Procedure: PACEMAKER IMPLANT;  Surgeon: Evans Lance, MD;  Location: [facility];  Service: Cardiovascular;  Laterality: N/A;  . REVERSE SHOULDER ARTHROPLASTY Right 01/26/2013   Procedure: RIGHT REVERSE TOTAL SHOULDER ARTHROPLASTY;  Surgeon: Augustin Schooling, MD;  Location: [facility];  Service: Orthopedics;  Laterality: Right;  . TRANSTHORACIC ECHOCARDIOGRAM  10/2012   EF 75-70%, mod conc hypertrophy, severely calcified MV annulus, LA mildly dailted, PA peak pressure 10mmHg     OB History   No obstetric history on file.     Family History  Problem Relation Age of Onset  . Throat cancer Father   . Cancer Mother   . CAD Son   . Breast cancer Sister        cervical cancer  . Colon cancer Brother        throat cancer  . Pulmonary embolism Daughter   . Hypertension Daughter     Social History   Tobacco Use  . Smoking status: Former Smoker    Quit date: 05/07/1984    Years since quitting: 36.0  . Smokeless tobacco: Never Used  Substance Use Topics  . Alcohol use: No  . Drug use: No    Home Medications Prior to Admission medications   Medication Sig Start Date End Date Taking? Authorizing Provider  amLODipine (NORVASC) 2.5 MG tablet Take 1 tablet (2.5 mg total) by mouth daily. Patient taking differently: Take 2.5 mg by mouth daily. On hold due to b/p dropping 05/31/19   Croitoru, Mihai, MD  amLODipine (NORVASC) 2.5 MG tablet Take 1 tablet (2.5 mg total) by mouth once for 1 dose. 09/18/19 09/18/19  Allie Bossier, MD  amLODipine (NORVASC) 5 MG tablet Take 1 tablet (5 mg total) by mouth daily. 09/19/19   Allie Bossier, MD  cilostazol (PLETAL) 50 MG tablet TAKE 1 TABLET BY MOUTH TWICE A DAY 10/30/19   Troy Sine, MD  citalopram (CELEXA) 10 MG tablet TAKE 1 TABLET BY MOUTH EVERY DAY 12/04/19   Troy Sine, MD  ezetimibe (ZETIA) 10 MG tablet TAKE 1 TABLET BY MOUTH EVERY DAY 11/19/19   Troy Sine, MD  feeding supplement, ENSURE COMPLETE, (ENSURE  COMPLETE) LIQD Take 237 mLs by mouth 3 (three) times daily between meals. 08/27/14   Barrett, Evelene Croon, PA-C  geriatric multivitamins-minerals (ELDERTONIC/GEVRABON) LIQD Take 15 mLs by mouth daily.    [provider]  LIVALO 2 MG TABS TAKE 1 TABLET BY MOUTH EVERY DAY 12/04/19   Troy Sine, MD  losartan (COZAAR) 100 MG tablet Take 1 tablet (100 mg total) by mouth daily. Patient not taking: Reported on 11/06/2019 06/11/19   Croitoru, Mihai, MD  metoprolol succinate (TOPROL-XL) 100 MG 24 hr tablet TAKE 1 TABLET (100 MG TOTAL) BY MOUTH DAILY. TAKE WITH OR IMMEDIATELY FOLLOWING A MEAL. 04/01/20   Croitoru, Mihai, MD  omega-3 acid ethyl esters (LOVAZA) 1 g capsule TAKE 1 CAPSULE (1 G TOTAL) BY MOUTH EVERY EVENING. 12/25/19   Troy Sine, MD  pantoprazole (PROTONIX) 40 MG tablet Take 1 tablet (40  mg total) by mouth daily. Patient taking differently: Take 40 mg by mouth daily as needed (for acid reflux).  06/18/19   Troy Sine, MD  sulfamethoxazole-trimethoprim (BACTRIM DS) 800-160 MG tablet Take 1 tablet by mouth 2 (two) times daily. 02/14/20   Troy Sine, MD  timolol (TIMOPTIC) 0.5 % ophthalmic solution Place 1 drop into both eyes 2 (two) times daily.     [provider]    Allergies    Atorvastatin calcium, Crestor [rosuvastatin calcium], Lipitor [atorvastatin calcium], and Rosuvastatin calcium  Review of Systems   Review of Systems  Unable to perform ROS: Mental status change    Physical Exam Updated Vital Signs BP (!) 218/104 (BP Location: Left Arm)   Pulse 93   Temp 98.5 F (36.9 C) (Oral)   Resp 18   Ht 5\' 4"  (1.626 m)   Wt 52 kg   SpO2 98%   BMI 19.68 kg/m   Physical Exam Vitals and nursing note reviewed.  Constitutional:      Appearance: She is well-developed.     Comments: Awake, picking at clothing throughout exam  HENT:     Head: Normocephalic and atraumatic.  Eyes:     Conjunctiva/sclera: Conjunctivae normal.     Pupils: Pupils are equal,  round, and reactive to light.  Cardiovascular:     Rate and Rhythm: Normal rate and regular rhythm.     Heart sounds: Normal heart sounds.  Pulmonary:     Effort: Pulmonary effort is normal.     Breath sounds: Normal breath sounds.  Abdominal:     General: Bowel sounds are normal.     Palpations: Abdomen is soft.     Tenderness: There is no abdominal tenderness. There is no guarding or rebound.     Comments: Soft, non-tender  Musculoskeletal:        General: Normal range of motion.     Cervical back: Normal range of motion.  Skin:    General: Skin is warm and dry.  Neurological:     Mental Status: She is alert.     Comments: Awake, alert, talking nonsensical, moving extremities spontaneously during exam     ED Results / Procedures / Treatments   Labs (all labs ordered are listed, but only abnormal results are displayed) Labs Reviewed  COMPREHENSIVE METABOLIC PANEL - Abnormal; Notable for the following components:      Result Value   CO2 21 (*)    Glucose, Bld 160 (*)    Creatinine, Ser 1.43 (*)    Calcium 10.6 (*)    GFR calc non Af Amer 32 (*)    GFR calc Af Amer 37 (*)    All other components within normal limits  CBC - Abnormal; Notable for the following components:   Hemoglobin 15.4 (*)    HCT 46.9 (*)    All other components within normal limits  URINALYSIS, ROUTINE W REFLEX MICROSCOPIC - Abnormal; Notable for the following components:   Protein, ur 100 (*)    All other components within normal limits  LACTIC ACID, PLASMA - Abnormal; Notable for the following components:   Lactic Acid, Venous 4.1 (*)    All other components within normal limits  SARS CORONAVIRUS 2 BY RT PCR (HOSPITAL ORDER, [facility])  URINE CULTURE  CULTURE, BLOOD (ROUTINE X 2)  CULTURE, BLOOD (ROUTINE X 2)  LIPASE, BLOOD    EKG EKG Interpretation  Date/Time:  Thursday May 29 2020 05:31:20 EDT Ventricular  Rate:  86 PR Interval:    QRS Duration: 83 QT  Interval:  407 QTC Calculation: 487 R Axis:   4 Text Interpretation: Sinus rhythm Prolonged PR interval RSR' in V1 or V2, probably normal variant Probable LVH with secondary repol abnrm Borderline prolonged QT interval Baseline wander in lead(s) V2 Confirmed by Pryor Curia [phone]) on 05/29/2020 5:36:29 AM   Radiology CT Head Wo Contrast  Result Date: 05/29/2020 CLINICAL DATA:  Encephalopathy. EXAM: CT HEAD WITHOUT CONTRAST TECHNIQUE: Contiguous axial images were obtained from the base of the skull through the vertex without intravenous contrast. COMPARISON:  09/17/2019 FINDINGS: Brain: Stable age related cerebral atrophy, ventriculomegaly and periventricular white matter disease. No extra-axial fluid collections are identified. No CT findings for acute hemispheric infarction or intracranial hemorrhage. No mass lesions. The brainstem and cerebellum are normal. Vascular: Stable advanced vascular calcifications but no definite aneurysm or hyperdense vessels. Skull: No skull fracture or bone lesions. Sinuses/Orbits: The paranasal sinuses and mastoid air cells are clear except for some mucoperiosteal thickening in the right maxillary sinus. The globes are intact. Other: No scalp lesions or scalp hematoma. IMPRESSION: 1. Stable age related cerebral atrophy, ventriculomegaly and periventricular white matter disease. 2. No acute intracranial findings or mass lesions. Electronically Signed   By: Marijo Sanes M.D.   On: 05/29/2020 05:16   DG Chest Port 1 View  Result Date: 05/29/2020 CLINICAL DATA:  Altered mental status EXAM: PORTABLE CHEST 1 VIEW COMPARISON:  09/16/2019 FINDINGS: Cardiomegaly and aortic tortuosity. Dual-chamber pacer leads in unremarkable position. Mitral annular calcification. There is no edema, consolidation, effusion, or pneumothorax. Right glenohumeral arthroplasty, reversed. There is osteopenia. Cholecystectomy clips. IMPRESSION: No evidence of acute disease. Electronically Signed   By:  Monte Fantasia M.D.   On: 05/29/2020 04:24    Procedures Procedures (including critical care time)  CRITICAL CARE Performed by: Larene Pickett   Total critical care time: 45 minutes  Critical care time was exclusive of separately billable procedures and treating other patients.  Critical care was necessary to treat or prevent imminent or life-threatening deterioration.  Critical care was time spent personally by me on the following activities: development of treatment plan with patient and/or surrogate as well as nursing, discussions with consultants, evaluation of patient's response to treatment, examination of patient, obtaining history from patient or surrogate, ordering and performing treatments and interventions, ordering and review of laboratory studies, ordering and review of radiographic studies, pulse oximetry and re-evaluation of patient's condition.   Medications Ordered in ED Medications - No data to display  ED Course  I have reviewed the triage vital signs and the nursing notes.  Pertinent labs & imaging results that were available during my care of the patient were reviewed by me and considered in my medical decision making (see chart for details).    MDM Rules/Calculators/A&P  86 year old female presenting to the ED with altered mental status.  Son-in-law present at bedside states this is been over the past 24 to 36 hours.  They thought she had UTI as she is acted similarly in the past.  They gave her 3 tablets of leftover Bactrim from prior infection without any noted change.  She has not had any cough, fever, or other infectious symptoms.  She has not really had much to eat over the past 24 hours but no vomiting.  Patient is awake but obviously confused.  She is talking to herself, picking at items on her clothing, etc.  Screening labs obtained from triage are overall  reassuring.  UA pending.  Will add on lactate, blood cultures, chest x-ray, and CT of the head given  her change in mental status.  UA is normal without any noted bacteria or WBCs.  Chest x-ray is clear.  CT of the head is negative.  Lactate is 4.1.  Code sepsis initiated and given broad-spectrum antibiotics.  Rectal temp is 98.4.  Not currently on metformin.  She has not had any real infectious symptoms, this may be from dehydration, however does have a history of bacteremia in the past.  Will admit for ongoing care.  Discussed with hospitalist, Dr. Cyd Silence-- will admit for ongoing care.  Final Clinical Impression(s) / ED Diagnoses Final diagnoses:  Altered mental status, unspecified altered mental status type    Rx / DC Orders ED Discharge Orders    None       Larene Pickett, PA-C 05/29/20 [place], [place], PA-C 05/29/20 0552    Ward, Delice Bison, DO 05/29/20 0559

## 2020-05-29 NOTE — ED Notes (Signed)
[name] [phone]

## 2020-05-29 NOTE — ED Notes (Signed)
Patient transported to CT 

## 2020-05-29 NOTE — ED Notes (Signed)
RN notified Lattie Haw, [place] of critical lactic acid.

## 2020-05-29 NOTE — ED Notes (Signed)
Dr. Cyd Silence at bedside, he is now aware of HTN.

## 2020-05-29 NOTE — H&P (Signed)
History and Physical    Julie Bass YBO:[id number] DOB: [birth year] DOA: 05/28/2020  PCP: Denita Lung, MD  Patient coming from: Home   Chief Complaint:  Chief Complaint  Patient presents with  . Urinary Tract Infection     HPI:    86 year old female with past medical history of dementia, COVID-19 infection (01/2020), hypertension, chronic kidney disease stage III, hyperlipidemia, gastroesophageal reflux disease who presents to [facility] emergency department with family due to concerns for acute confusion.  Patient is an extremely poor historian due to acute confusion.  The majority the history has been obtained from the daughter via phone conversation.  Daughter explains that approximately 2 days ago the patient began to exhibit confusion.  Patient "was not making any sense when talking."  Patient also had a substantially worsened appetite.  Upon further questioning the daughter denies any slurring of speech facial droop or focal weakness in the patient.  Patient has not had any sick contacts or fevers as of late.    Upon further questioning it turns out that the patient has been complaining of vague generalized abdominal pain for approximately 1 week prior to the onset of her confusion.  Patient has not exhibited any diarrhea nausea or vomiting.  The daughter, concern for a recurrent urinary tract infection this patient started the patient on an old supply of Bactrim despite the patient not having been seen by a medical provider or having an appropriate work-up.  Due to lack of improvement in symptoms after 3 doses of Bactrim the daughter brought the patient into [facility] emergency department for evaluation.  Upon evaluation in the emergency department the patient is visibly confused and not consistently following commands.  Patient is also been found to be in hypertensive urgency.  Patient has been found to have a substantial lactic acidosis of 4.1.  Due to  concerns for acute encephalopathy and possible underlying infectious process the hospitalist group has been called to assess the patient for admission the hospital.   Review of Systems: Unable to obtain due to patient's substantial confusion.   Past Medical History:  Diagnosis Date  . Arthritis   . Chronic kidney disease    KIDNEY STONES  . Dementia ([place])    BEGINNING STAGES   . Depression   . GERD (gastroesophageal reflux disease)   . Glaucoma   . Hiatal hernia   . HTN (hypertension) 10/18/2011  . Hyperlipemia 10/18/2011  . NSVT (nonsustained ventricular tachycardia) ([place]) 10/18/2011  . Osteoporosis   . PAD (peripheral artery disease) ([place]) 10/18/2011   h/o left SFA stent    Past Surgical History:  Procedure Laterality Date  . ABDOMINAL HYSTERECTOMY  1973  . CARDIAC CATHETERIZATION     2012  . CHOLECYSTECTOMY    . LOOP RECORDER IMPLANT N/A 08/27/2014   Procedure: LOOP RECORDER IMPLANT;  Surgeon: Coralyn Mark, MD;  Location: [place] CATH LAB;  Service: Cardiovascular;  Laterality: N/A;  . LOOP RECORDER REMOVAL N/A 01/09/2018   Procedure: LOOP RECORDER REMOVAL;  Surgeon: Evans Lance, MD;  Location: [place] CV LAB;  Service: Cardiovascular;  Laterality: N/A;  . LOWER EXTREMITY ANGIOGRAM  11/02/2007   stent of mid left SFA with 6x121mm EV3 self-expanding stent and 6x3 in prox region (Dr. Adora Fridge)  . LOWER EXTREMITY ANGIOGRAM    . LOWER EXTREMITY ARTERIAL DOPPLER  2013   right SFA with 50-69% diameter reduction, right PTA/peroneal occluded, L SFA prox to stent has narrowing with increased velocities >  60% diameter reduction, L SFA stent patent, L ATA w/occlusive disease, bilat ABIs show mild arterial insuffiency at rest  . NM MYOCAR PERF WALL MOTION  10/2011   non-gated - normal stuy, EF 82%, normal LV wall motion  . PACEMAKER IMPLANT N/A 01/09/2018   Procedure: PACEMAKER IMPLANT;  Surgeon: Evans Lance, MD;  Location: [place] CV [facility];  Service: Cardiovascular;   Laterality: N/A;  . REVERSE SHOULDER ARTHROPLASTY Right 01/26/2013   Procedure: RIGHT REVERSE TOTAL SHOULDER ARTHROPLASTY;  Surgeon: Augustin Schooling, MD;  Location: [place];  Service: Orthopedics;  Laterality: Right;  . TRANSTHORACIC ECHOCARDIOGRAM  10/2012   EF 75-70%, mod conc hypertrophy, severely calcified MV annulus, LA mildly dailted, PA peak pressure 49mmHg     reports that she quit smoking about 36 years ago. She has never used smokeless tobacco. She reports that she does not drink alcohol and does not use drugs.  Allergies  Allergen Reactions  . Atorvastatin Calcium     Other reaction(s): Myalgias (intolerance) Muscle pain  . Crestor [Rosuvastatin Calcium]     Muscle pain  . Lipitor [Atorvastatin Calcium]     Muscle pain  . Rosuvastatin Calcium     Other reaction(s): Myalgias (intolerance) Muscle pain    Family History  Problem Relation Age of Onset  . Throat cancer Father   . Cancer Mother   . CAD Son   . Breast cancer Sister        cervical cancer  . Colon cancer Brother        throat cancer  . Pulmonary embolism Daughter   . Hypertension Daughter      Prior to Admission medications   Medication Sig Start Date End Date Taking? Authorizing Provider  amLODipine (NORVASC) 2.5 MG tablet Take 1 tablet (2.5 mg total) by mouth daily. Patient taking differently: Take 2.5 mg by mouth daily. On hold due to b/p dropping 05/31/19  Yes Croitoru, Mihai, MD  amLODipine (NORVASC) 5 MG tablet Take 1 tablet (5 mg total) by mouth daily. 09/19/19  Yes Allie Bossier, MD  cilostazol (PLETAL) 50 MG tablet TAKE 1 TABLET BY MOUTH TWICE A DAY Patient taking differently: Take 50 mg by mouth 2 (two) times daily.  10/30/19  Yes Troy Sine, MD  citalopram (CELEXA) 10 MG tablet TAKE 1 TABLET BY MOUTH EVERY DAY Patient taking differently: Take 10 mg by mouth daily.  12/04/19  Yes Troy Sine, MD  ezetimibe (ZETIA) 10 MG tablet TAKE 1 TABLET BY MOUTH EVERY DAY Patient taking  differently: Take 10 mg by mouth daily.  11/19/19  Yes Troy Sine, MD  feeding supplement, ENSURE COMPLETE, (ENSURE COMPLETE) LIQD Take 237 mLs by mouth 3 (three) times daily between meals. 08/27/14  Yes Barrett, Evelene Croon, PA-C  geriatric multivitamins-minerals (ELDERTONIC/GEVRABON) LIQD Take 15 mLs by mouth daily.   Yes [provider]  LIVALO 2 MG TABS TAKE 1 TABLET BY MOUTH EVERY DAY Patient taking differently: Take 2 mg by mouth daily.  12/04/19  Yes Troy Sine, MD  losartan (COZAAR) 100 MG tablet Take 1 tablet (100 mg total) by mouth daily. 06/11/19  Yes Croitoru, Mihai, MD  metoprolol succinate (TOPROL-XL) 100 MG 24 hr tablet TAKE 1 TABLET (100 MG TOTAL) BY MOUTH DAILY. TAKE WITH OR IMMEDIATELY FOLLOWING A MEAL. 04/01/20  Yes Croitoru, Mihai, MD  omega-3 acid ethyl esters (LOVAZA) 1 g capsule TAKE 1 CAPSULE (1 G TOTAL) BY MOUTH EVERY EVENING. 12/25/19  Yes Troy Sine, MD  pantoprazole (PROTONIX) 40 MG tablet Take 1 tablet (40 mg total) by mouth daily. Patient taking differently: Take 40 mg by mouth daily as needed (for acid reflux).  06/18/19  Yes Troy Sine, MD  sulfamethoxazole-trimethoprim (BACTRIM DS) 800-160 MG tablet Take 1 tablet by mouth 2 (two) times daily. 02/14/20  Yes Troy Sine, MD  timolol (TIMOPTIC) 0.5 % ophthalmic solution Place 1 drop into both eyes 2 (two) times daily.    Yes [provider]  amLODipine (NORVASC) 2.5 MG tablet Take 1 tablet (2.5 mg total) by mouth once for 1 dose. 09/18/19 09/18/19  Allie Bossier, MD    Physical Exam: Vitals:   05/29/20 0208 05/29/20 0430 05/29/20 0515 05/29/20 0533  BP: (!) 218/104 (!) 177/94 (!) 209/81   Pulse: 93 89 85   Resp: 18 (!) 23 18   Temp: 98.5 F (36.9 C)   98.4 F (36.9 C)  TempSrc: Oral   Rectal  SpO2: 98% 97% 100%   Weight:      Height:        Constitutional: Lethargic but arousable, disoriented, no associated distress.   Skin: no rashes, no lesions, poor skin turgor noted. Eyes:  Pupils are equally reactive to light.  No evidence of scleral icterus or conjunctival pallor.  ENMT: Moist mucous membranes noted.  Posterior pharynx clear of any exudate or lesions.   Neck: normal, supple, no masses, no thyromegaly.  No evidence of jugular venous distension.   Respiratory: clear to auscultation bilaterally, no wheezing, no crackles. Normal respiratory effort. No accessory muscle use.  Cardiovascular: Regular rate and rhythm, no murmurs / rubs / gallops. No extremity edema. 2+ pedal pulses. No carotid bruits.  Chest:   Nontender without crepitus or deformity.   Back:   Nontender without crepitus or deformity. Abdomen: Occasional mild generalized abdominal tenderness.  Abdomen is soft.  No evidence of intra-abdominal masses.  Positive bowel sounds noted in all quadrants.   Musculoskeletal: No joint deformity upper and lower extremities. Good ROM, no contractures. Normal muscle tone.  Neurologic: Patient is moving all 4 extremities spontaneously.  Patient only intermittently follows commands.  Patient is lethargic but arousable and disoriented.  Patient is responsive to verbal stimuli.  Patient localizes to pain.   Psychiatric: Unable to fully assess due to substantial confusion in this patient.  Patient currently does not seem to possess insight as to her current situation.     Labs on Admission: I have personally reviewed following labs and imaging studies -   CBC: Recent Labs  Lab 05/28/20 2101  WBC 8.3  HGB 15.4*  HCT 46.9*  MCV 96.1  PLT 409   Basic Metabolic Panel: Recent Labs  Lab 05/28/20 2101  NA 138  K 3.8  CL 105  CO2 21*  GLUCOSE 160*  BUN 14  CREATININE 1.43*  CALCIUM 10.6*   GFR: Estimated Creatinine Clearance: 20.6 mL/min (A) (by C-G formula based on SCr of 1.43 mg/dL (H)). Liver Function Tests: Recent Labs  Lab 05/28/20 2101  AST 34  ALT 24  ALKPHOS 40  BILITOT 1.1  PROT 7.8  ALBUMIN 4.6   Recent Labs  Lab 05/28/20 2101  LIPASE 33     No results for input(s): AMMONIA in the last 168 hours. Coagulation Profile: No results for input(s): INR, PROTIME in the last 168 hours. Cardiac Enzymes: No results for input(s): CKTOTAL, CKMB, CKMBINDEX, TROPONINI in the last 168 hours. BNP (last 3 results) No results for input(s): PROBNP in the  last 8760 hours. HbA1C: No results for input(s): HGBA1C in the last 72 hours. CBG: No results for input(s): GLUCAP in the last 168 hours. Lipid Profile: No results for input(s): CHOL, HDL, LDLCALC, TRIG, CHOLHDL, LDLDIRECT in the last 72 hours. Thyroid Function Tests: No results for input(s): TSH, T4TOTAL, FREET4, T3FREE, THYROIDAB in the last 72 hours. Anemia Panel: No results for input(s): VITAMINB12, FOLATE, FERRITIN, TIBC, IRON, RETICCTPCT in the last 72 hours. Urine analysis:    Component Value Date/Time   COLORURINE YELLOW 05/29/2020 0426   APPEARANCEUR CLEAR 05/29/2020 0426   LABSPEC 1.016 05/29/2020 0426   LABSPEC 1.015 12/07/2019 1527   PHURINE 5.0 05/29/2020 0426   GLUCOSEU NEGATIVE 05/29/2020 0426   HGBUR NEGATIVE 05/29/2020 0426   BILIRUBINUR NEGATIVE 05/29/2020 0426   BILIRUBINUR negative 12/07/2019 1527   BILIRUBINUR n 11/25/2016 1409   KETONESUR NEGATIVE 05/29/2020 0426   PROTEINUR 100 (A) 05/29/2020 0426   UROBILINOGEN negative 11/25/2016 1409   UROBILINOGEN 1 10/08/2014 1529   NITRITE NEGATIVE 05/29/2020 0426   LEUKOCYTESUR NEGATIVE 05/29/2020 0426    Radiological Exams on Admission - Personally Reviewed: CT Head Wo Contrast  Result Date: 05/29/2020 CLINICAL DATA:  Encephalopathy. EXAM: CT HEAD WITHOUT CONTRAST TECHNIQUE: Contiguous axial images were obtained from the base of the skull through the vertex without intravenous contrast. COMPARISON:  09/17/2019 FINDINGS: Brain: Stable age related cerebral atrophy, ventriculomegaly and periventricular white matter disease. No extra-axial fluid collections are identified. No CT findings for acute hemispheric  infarction or intracranial hemorrhage. No mass lesions. The brainstem and cerebellum are normal. Vascular: Stable advanced vascular calcifications but no definite aneurysm or hyperdense vessels. Skull: No skull fracture or bone lesions. Sinuses/Orbits: The paranasal sinuses and mastoid air cells are clear except for some mucoperiosteal thickening in the right maxillary sinus. The globes are intact. Other: No scalp lesions or scalp hematoma. IMPRESSION: 1. Stable age related cerebral atrophy, ventriculomegaly and periventricular white matter disease. 2. No acute intracranial findings or mass lesions. Electronically Signed   By: Marijo Sanes M.D.   On: 05/29/2020 05:16   DG Chest Port 1 View  Result Date: 05/29/2020 CLINICAL DATA:  Altered mental status EXAM: PORTABLE CHEST 1 VIEW COMPARISON:  09/16/2019 FINDINGS: Cardiomegaly and aortic tortuosity. Dual-chamber pacer leads in unremarkable position. Mitral annular calcification. There is no edema, consolidation, effusion, or pneumothorax. Right glenohumeral arthroplasty, reversed. There is osteopenia. Cholecystectomy clips. IMPRESSION: No evidence of acute disease. Electronically Signed   By: Monte Fantasia M.D.   On: 05/29/2020 04:24    EKG: Personally reviewed.  Rhythm is normal sinus rhythm with heart rate of 86 bpm.  No dynamic ST segment changes appreciated.  Assessment/Plan Principal Problem:   Acute metabolic encephalopathy   Patient exhibiting 2 days of acute confusion according to family.    Physical examination is nonfocal with CT imaging of the head not revealing any acute disease.  No obvious significant electrolyte abnormalities.  Urinalysis unremarkable although this may be falsely normalized due to several doses of Bactrim administered by family in the outpatient setting.  Obtaining vitamin B12 level, folate, ammonia, TSH  Urine cultures and blood cultures obtained considering substantial lactic acidosis.  Encephalopathy may  be secondary to hypertensive encephalopathy considering concurrent presentation with hypertensive urgency/emergency.  We are working to bring this down with a combination of resuming home regimen of oral antihypertensives and as needed intravenous antihypertensives.  Empirically treating with intravenous antibiotic therapy for now which can be quickly deescalated as work-up is completed.  Hydrating patient gently  with intravenous isotonic fluids.  Active Problems:   Acute renal failure superimposed on stage 3a chronic kidney disease (HCC)   Acute kidney injury superimposed on chronic kidney disease stage III likely secondary to poor oral intake as of late due to acute confusion and abdominal pain  Hydrating patient gently with intravenous isotonic fluids  Strict input and output monitoring  Monitoring renal function and electrolytes with serial chemistries  Minimizing nephrotoxic agents    Lactic acidosis   Markedly elevated lactic acid of 4.1  Possibly secondary to significant volume depletion or underlying infectious process  Empiric antibiotic therapy with intravenous ceftriaxone for now  Considering patient's vague complaints of generalized abdominal pain over the past week, if patient continues to exhibit lactic acidosis and abdominal pain will obtain CT imaging of the abdomen and pelvis with intravenous contrast within the next 24 hours as renal function allows.  Hydrating patient with intravenous isotonic fluids  Monitoring lactic acid levels to ensure downtrending and resolution    Hypertensive urgency   Patient presenting with substantial hypertension with systolic blood pressures consistently above 200  Hypertensive urgency/emergency could be contributing to patient's confusion in the form of hypertensive encephalopathy  Discussed antihypertensive regimen with daughter, it seems that several of the patient's antihypertensives were discontinued several months ago  due to low blood pressure  Will resume home regimen of oral antihypertensives and provide patient with.  Intravenous antihypertensives    GERD without esophagitis  Continue home regimen of PPI    Code Status:  DNR Family Communication: Discussed patient care with daughter via phone conversation  Status is: Observation  The patient remains OBS appropriate and will d/c before 2 midnights.  Dispo: The patient is from: Home              Anticipated d/c is to: Home              Anticipated d/c date is: 2 days              Patient currently is not medically stable to d/c.        Vernelle Emerald MD [facility] Pager [phone]  If 7PM-7AM, please contact night-coverage [email or web address] Use universal [place] password for that web site. If you do not have the password, please call the hospital operator.  05/29/2020, 7:04 AM

## 2020-05-29 NOTE — Progress Notes (Signed)
PROGRESS NOTE                                                                                                                                                                                                             Patient Demographics:    Julie Bass, is a 86 y.o. female, DOB - 06-25-1926, HYI:[id number]  Admit date - 05/28/2020   Admitting Physician Vernelle Emerald, MD  Outpatient Primary MD for the patient is Denita Lung, MD  LOS - 0   Chief Complaint  Patient presents with  . Urinary Tract Infection       Brief Narrative   This is a no charge note, as patient was seen and admitted earlier today  86 year old female with past medical history of dementia, COVID-19 infection (01/2020), hypertension, chronic kidney disease stage III, hyperlipidemia, gastroesophageal reflux disease who presents to [facility] emergency department with family due to concerns for acute confusion.  Patient is an extremely poor historian due to acute confusion.  The majority the history has been obtained from the daughter via phone conversation.  Daughter explains that approximately 2 days ago the patient began to exhibit confusion.  Patient "was not making any sense when talking."  Patient also had a substantially worsened appetite.  Upon further questioning the daughter denies any slurring of speech facial droop or focal weakness in the patient.  Patient has not had any sick contacts or fevers as of late.    Upon further questioning it turns out that the patient has been complaining of vague generalized abdominal pain for approximately 1 week prior to the onset of her confusion.  Patient has not exhibited any diarrhea nausea or vomiting.  The daughter, concern for a recurrent urinary tract infection this patient started the patient on an old supply of Bactrim despite the patient not having been seen by a medical provider or having an appropriate  work-up.  Due to lack of improvement in symptoms after 3 doses of Bactrim the daughter brought the patient into [facility] emergency department for evaluation.  Upon evaluation in the emergency department the patient is visibly confused and not consistently following commands.  Patient is also been found to be in hypertensive urgency.  Patient has been found to have a substantial lactic acidosis of 4.1.  Due to concerns for acute  encephalopathy and possible underlying infectious process the hospitalist group has been called to assess the patient for admission the hospital.   Subjective:    Julie Bass today remains confused, cannot provide reliable complaints .   Assessment  & Plan :    Principal Problem:   Acute metabolic encephalopathy Active Problems:   GERD without esophagitis   Acute renal failure superimposed on stage 3a chronic kidney disease (HCC)   Lactic acidosis   Hypertensive urgency    Acute metabolic encephalopathy   Patient exhibiting 2 days of acute confusion according to family.    Physical examination is nonfocal with CT imaging of the head not revealing any acute disease.  No obvious significant electrolyte abnormalities.  Urinalysis unremarkable although this may be falsely normalized due to several doses of Bactrim administered by family in the outpatient setting.  Obtaining vitamin B12 level, folate, ammonia, TSH  Urine cultures and blood cultures obtained considering substantial lactic acidosis.  Encephalopathy may be secondary to hypertensive encephalopathy considering concurrent presentation with hypertensive urgency/emergency.    Please see discussion below under hypertension .  empirically treating with intravenous antibiotic therapy for now which can be quickly deescalated as work-up is completed.  Hydrating patient gently with intravenous isotonic fluids.     Acute renal failure superimposed on stage 3a chronic kidney disease  ([place])   Acute kidney injury superimposed on chronic kidney disease stage III likely secondary to poor oral intake as of late due to acute confusion and abdominal pain  Hydrating patient gently with intravenous isotonic fluids  Strict input and output monitoring  Monitoring renal function and electrolytes with serial chemistries  Minimizing nephrotoxic agents    Lactic acidosis   Markedly elevated lactic acid of 4.1 Pete remains elevated despite appropriate hydration at 4.3, continue with IV hydration, and continue with antibiotic coverage.  Possibly secondary to significant volume depletion or underlying infectious process  Empiric antibiotic therapy with intravenous ceftriaxone for now  Considering patient's vague complaints of generalized abdominal pain over the past week, if patient continues to exhibit lactic acidosis and abdominal pain will obtain CT imaging of the abdomen and pelvis with intravenous contrast within the next 24 hours as renal function allows.  Hydrating patient with intravenous isotonic fluids  Monitoring lactic acid levels to ensure downtrending and resolution    Hypertensive urgency   Patient presenting with substantial hypertension with systolic blood pressures consistently above 200  Hypertensive urgency/emergency could be contributing to patient's confusion in the form of hypertensive encephalopathy  Admitting provider discussed antihypertensive regimen with daughter, it seems that several of the patient's antihypertensives were discontinued several months ago due to low blood pressure  Is resumed back on her home medication, as well she was started on as needed IV hydralazine, she did require x2 doses already since this a.m., but currently blood pressure has been acceptable and her oral regimen and as needed hydralazine .    GERD without esophagitis  Continue home regimen of PPI   COVID-19 Labs  Recent Labs    05/29/20 0733  CRP <0.5     Lab Results  Component Value Date   SARSCOV2NAA NEGATIVE 05/29/2020   SARSCOV2NAA POSITIVE (A) 09/16/2019     Code Status : DNR  Family Communication  : none at bedside  Disposition Plan  :  Status is: Observation  The patient remains OBS appropriate and will d/c before 2 midnights.  Dispo: The patient is from: Home  Anticipated d/c is to: Home              Anticipated d/c date is: 1 day              Patient currently is not medically stable to d/c.       Consults  :  none  Procedures  : None  DVT Prophylaxis  :   Lovenox  Lab Results  Component Value Date   PLT 206 05/29/2020    Antibiotics  :    Anti-infectives (From admission, onward)   Start     Dose/Rate Route Frequency Ordered Stop   05/29/20 1400  cefTRIAXone (ROCEPHIN) 1 g in sodium chloride 0.9 % 100 mL IVPB     Discontinue     1 g 200 mL/hr over 30 Minutes Intravenous Every 24 hours 05/29/20 0701     05/29/20 0530  vancomycin (VANCOCIN) IVPB 1000 mg/200 mL premix  Status:  Discontinued        1,000 mg 200 mL/hr over 60 Minutes Intravenous  Once 05/29/20 0517 05/29/20 0649   05/29/20 0530  ceFEPIme (MAXIPIME) 2 g in sodium chloride 0.9 % 100 mL IVPB        2 g 200 mL/hr over 30 Minutes Intravenous  Once 05/29/20 0517 05/29/20 0620   05/29/20 0515  metroNIDAZOLE (FLAGYL) IVPB 500 mg        500 mg 100 mL/hr over 60 Minutes Intravenous  Once 05/29/20 0513 05/29/20 0646        Objective:   Vitals:   05/29/20 1154 05/29/20 1315 05/29/20 1330 05/29/20 1345  BP: (!) 207/92 (!) 111/59 (!) 138/58 (!) 154/70  Pulse: 93 76 74 80  Resp: 16   18  Temp:      TempSrc:      SpO2: 98% 94% 96% 96%  Weight:      Height:        Wt Readings from Last 3 Encounters:  05/28/20 52 kg  09/16/19 51.7 kg  08/06/19 52.6 kg    No intake or output data in the 24 hours ending 05/29/20 1416   Physical Exam  Awake Alert, confused, significantly impaired cognition and insight . Symmetrical  Chest wall movement, Good air movement bilaterally, CTAB RRR,No Gallops,Rubs or new Murmurs, No Parasternal Heave +ve B.Sounds, Abd Soft, No tenderness,  No rebound - guarding or rigidity. No Cyanosis, Clubbing or edema, No new Rash or bruise     Data Review:    CBC Recent Labs  Lab 05/28/20 2101 05/29/20 1005  WBC 8.3 10.9*  HGB 15.4* 15.7*  HCT 46.9* 47.7*  PLT 251 206  MCV 96.1 96.0  MCH 31.6 31.6  MCHC 32.8 32.9  RDW 13.2 13.3  LYMPHSABS  --  1.7  MONOABS  --  1.1*  EOSABS  --  0.0  BASOSABS  --  0.1    Chemistries  Recent Labs  Lab 05/28/20 2101 05/29/20 0919  NA 138 139  K 3.8 3.2*  CL 105 107  CO2 21* 19*  GLUCOSE 160* 135*  BUN 14 11  CREATININE 1.43* 1.41*  CALCIUM 10.6* 9.8  MG  --  1.6*  AST 34 41  ALT 24 24  ALKPHOS 40 38  BILITOT 1.1 1.2   ------------------------------------------------------------------------------------------------------------------ No results for input(s): CHOL, HDL, LDLCALC, TRIG, CHOLHDL, LDLDIRECT in the last 72 hours.  Lab Results  Component Value Date   HGBA1C 5.1 01/08/2018   ------------------------------------------------------------------------------------------------------------------ Recent Labs    05/29/20 0733  TSH  1.974   ------------------------------------------------------------------------------------------------------------------ Recent Labs    05/29/20 0733  VITAMINB12 883  FOLATE QUANTITY NOT SUFFICIENT, UNABLE TO PERFORM TEST    Coagulation profile No results for input(s): INR, PROTIME in the last 168 hours.  No results for input(s): DDIMER in the last 72 hours.  Cardiac Enzymes No results for input(s): CKMB, TROPONINI, MYOGLOBIN in the last 168 hours.  Invalid input(s): CK ------------------------------------------------------------------------------------------------------------------ No results found for: BNP  Inpatient Medications  Scheduled Meds: . cilostazol  50 mg Oral BID   . citalopram  10 mg Oral Daily  . enoxaparin (LOVENOX) injection  30 mg Subcutaneous Q24H  . ezetimibe  10 mg Oral Daily  . feeding supplement (ENSURE ENLIVE)  237 mL Oral TID BM  . geriatric multivitamins-minerals  15 mL Oral Daily  . metoprolol succinate  100 mg Oral Daily  . omega-3 acid ethyl esters  1 g Oral QPM  . pantoprazole  40 mg Oral Daily  . timolol  1 drop Both Eyes BID   Continuous Infusions: . cefTRIAXone (ROCEPHIN)  IV 1 g (05/29/20 1406)  . lactated ringers 75 mL/hr at 05/29/20 0851   PRN Meds:.acetaminophen **OR** acetaminophen, hydrALAZINE, ondansetron **OR** ondansetron (ZOFRAN) IV, polyethylene glycol  Micro Results Recent Results (from the past 240 hour(s))  Blood culture (routine x 2)     Status: None (Preliminary result)   Collection Time: 05/29/20  4:15 AM   Specimen: BLOOD  Result Value Ref Range Status   Specimen Description BLOOD LEFT ARM  Final   Special Requests   Final    BOTTLES DRAWN AEROBIC AND ANAEROBIC Blood Culture adequate volume   Culture   Final    NO GROWTH < 12 HOURS Performed at [facility], [place] [address], [place], [place] [address]    Report Status PENDING  Incomplete  Blood culture (routine x 2)     Status: None (Preliminary result)   Collection Time: 05/29/20  4:20 AM   Specimen: BLOOD  Result Value Ref Range Status   Specimen Description BLOOD RIGHT ARM  Final   Special Requests   Final    BOTTLES DRAWN AEROBIC AND ANAEROBIC Blood Culture results may not be optimal due to an inadequate volume of blood received in culture bottles   Culture   Final    NO GROWTH < 12 HOURS Performed at [facility], [place] [address] [place], [place] [address]    Report Status PENDING  Incomplete  SARS Coronavirus 2 by RT PCR (hospital order, performed in [facility] hospital lab) Nasopharyngeal Nasopharyngeal Swab     Status: None   Collection Time: 05/29/20  4:27 AM   Specimen: Nasopharyngeal Swab  Result Value Ref Range Status    SARS Coronavirus 2 NEGATIVE NEGATIVE Final    Comment: (NOTE) SARS-CoV-2 target nucleic acids are NOT DETECTED.  The SARS-CoV-2 RNA is generally detectable in upper and lower respiratory specimens during the acute phase of infection. The lowest concentration of SARS-CoV-2 viral copies this assay can detect is 250 copies / mL. A negative result does not preclude SARS-CoV-2 infection and should not be used as the sole basis for treatment or other patient management decisions.  A negative result may occur with improper specimen collection / handling, submission of specimen other than nasopharyngeal swab, presence of viral mutation(s) within the areas targeted by this assay, and inadequate number of viral copies (<250 copies / mL). A negative result must be combined with clinical observations, patient history, and epidemiological information.  Fact Sheet for Patients:   StrictlyIdeas.no  Fact Sheet for Healthcare Providers: BankingDealers.co.za  This test is not yet approved or  cleared by the [place] FDA and has been authorized for detection and/or diagnosis of SARS-CoV-2 by FDA under an Emergency Use Authorization (EUA).  This EUA will remain in effect (meaning this test can be used) for the duration of the COVID-19 declaration under Section 564(b)(1) of the Act, 21 U.S.C. section 360bbb-3(b)(1), unless the authorization is terminated or revoked sooner.  Performed at [facility], [place] [address], [place], [place] [address]     Radiology Reports CT Head Wo Contrast  Result Date: 05/29/2020 CLINICAL DATA:  Encephalopathy. EXAM: CT HEAD WITHOUT CONTRAST TECHNIQUE: Contiguous axial images were obtained from the base of the skull through the vertex without intravenous contrast. COMPARISON:  09/17/2019 FINDINGS: Brain: Stable age related cerebral atrophy, ventriculomegaly and periventricular white matter disease. No  extra-axial fluid collections are identified. No CT findings for acute hemispheric infarction or intracranial hemorrhage. No mass lesions. The brainstem and cerebellum are normal. Vascular: Stable advanced vascular calcifications but no definite aneurysm or hyperdense vessels. Skull: No skull fracture or bone lesions. Sinuses/Orbits: The paranasal sinuses and mastoid air cells are clear except for some mucoperiosteal thickening in the right maxillary sinus. The globes are intact. Other: No scalp lesions or scalp hematoma. IMPRESSION: 1. Stable age related cerebral atrophy, ventriculomegaly and periventricular white matter disease. 2. No acute intracranial findings or mass lesions. Electronically Signed   By: Marijo Sanes M.D.   On: 05/29/2020 05:16   DG Chest Port 1 View  Result Date: 05/29/2020 CLINICAL DATA:  Altered mental status EXAM: PORTABLE CHEST 1 VIEW COMPARISON:  09/16/2019 FINDINGS: Cardiomegaly and aortic tortuosity. Dual-chamber pacer leads in unremarkable position. Mitral annular calcification. There is no edema, consolidation, effusion, or pneumothorax. Right glenohumeral arthroplasty, reversed. There is osteopenia. Cholecystectomy clips. IMPRESSION: No evidence of acute disease. Electronically Signed   By: Monte Fantasia M.D.   On: 05/29/2020 04:24     Phillips Climes M.D on 05/29/2020 at 2:16 PM    [facility] -  Office  [phone]

## 2020-05-29 NOTE — ED Notes (Signed)
Lunch Tray Ordered @ 1120. 

## 2020-05-29 NOTE — Progress Notes (Signed)
Pt was seen for mobility on hallway with HHA.of two persons, and still has unsteady control of gait with turns being esp difficult.  Pt is assisted to BR, and will be recommended based on this struggle to go to SNF.  Pt is unsafe and unable to initiate safe mobility.  Will benefit to see if home can be recovered by rehab intervention.    05/29/20 2000  PT Visit Information  Last PT Received On 05/29/20  Assistance Needed +2  History of Present Illness 86 yo female with onset of confusion and loss of independence with mobility was found to have acute renal failure, acute metabolic encephalopathy, lactic acidosis, and AKI.  Pt was brought to ED and tested for UTI and with fluids. PMHx:  pacemaker, Covid 46 in March 2021, HTN, CKD3, HLD, GERD,   Precautions  Precautions Fall  Precaution Comments unsafe gait  Restrictions  Weight Bearing Restrictions No  Other Position/Activity Restrictions pt prefers fetal posture on bed  Home Living  Family/patient expects to be discharged to: Private residence  Living Arrangements Other relatives  Available Help at Discharge Family  Type of [place] - single point;Walker - 4 wheels  Additional Comments has family assisting her  Prior Function  Level of Independence Needs assistance  Gait / Transfers Assistance Needed RW vs rollator, SPC with help  ADL's / Homemaking Assistance Needed needs assist with ADLs and IADLs  Communication  Communication Other (comment) (confusion about history)  Pain Assessment  Pain Assessment No/denies pain  Cognition  Arousal/Alertness Awake/alert  Behavior During Therapy Flat affect  Overall Cognitive Status Impaired/Different from baseline  Area of Impairment Orientation;Attention;Memory;Following commands;Safety/judgement;Awareness;Problem solving  Orientation Level Disoriented to;Situation;Time;Place  Current Attention Level Selective  Memory Decreased recall of  precautions;Decreased short-term memory  Following Commands Follows one step commands inconsistently;Follows one step commands with increased time  Safety/Judgement Decreased awareness of safety;Decreased awareness of deficits  Awareness Intellectual  Problem Solving Slow processing;Requires verbal cues;Requires tactile cues;Decreased initiation;Difficulty sequencing  Upper Extremity Assessment  Upper Extremity Assessment Generalized weakness  Lower Extremity Assessment  Lower Extremity Assessment Generalized weakness  Cervical / Trunk Assessment  Cervical / Trunk Assessment Kyphotic  Bed Mobility  Overal bed mobility Needs Assistance  Bed Mobility Supine to Sit;Sit to Supine  Supine to sit Mod assist  Sit to supine Mod assist  General bed mobility comments min assist for support of trunk and back with leg support  Transfers  Overall transfer level Needs assistance  Equipment used 2 person hand held assist  Transfers Sit to/from Stand  Sit to Stand Mod assist;+2 physical assistance;+2 safety/equipment  Ambulation/Gait  Ambulation/Gait assistance Mod assist;+2 physical assistance;+2 safety/equipment  Gait Distance (Feet) 38 Feet  Assistive device 2 person hand held assist  Gait Pattern/deviations Step-through pattern;Decreased stride length;Shuffle;Wide base of support  General Gait Details pt is unsafe, shuffling on RLE and struggling to pivot turn  Gait velocity educed  Gait velocity interpretation <1.31 ft/sec, indicative of household ambulator  Balance  Overall balance assessment Needs assistance  Sitting-balance support Feet supported;Bilateral upper extremity supported  Sitting balance-Leahy Scale Fair  Postural control Posterior lean  Standing balance support Bilateral upper extremity supported;During functional activity  Standing balance-Leahy Scale Poor  General Comments  General comments (skin integrity, edema, etc.) pt demonstrates fair sit balance with poor balance to  control standing, with struggle to balance without AD  Exercises  Exercises Other exercises (3+ LE strength on RLE)  PT -  End of Session  Equipment Utilized During Treatment Gait belt  Activity Tolerance Treatment limited secondary to medical complications (Comment);Patient limited by fatigue  Patient left with call bell/phone within reach;in bed;with bed alarm set  Nurse Communication Mobility status  PT Assessment  PT Recommendation/Assessment Patient needs continued PT services  PT Visit Diagnosis Unsteadiness on feet (R26.81);Muscle weakness (generalized) (M62.81);Difficulty in walking, not elsewhere classified (R26.2)  PT Problem List Decreased strength;Decreased range of motion;Decreased activity tolerance;Decreased balance;Decreased mobility;Decreased coordination;Decreased cognition;Decreased knowledge of use of DME;Decreased safety awareness;Decreased knowledge of precautions;Cardiopulmonary status limiting activity  Barriers to Discharge Decreased caregiver support;Inaccessible home environment  Barriers to Discharge Comments home alone, stairs to enter house  PT Plan  PT Frequency (ACUTE ONLY) Min 3X/week  PT Treatment/Interventions (ACUTE ONLY) Gait training;DME instruction;Stair training;Functional mobility training;Therapeutic activities;Therapeutic exercise;Balance training;Neuromuscular re-education;Patient/family education  AM-PAC PT "6 Clicks" Mobility Outcome Measure (Version 2)  Help needed turning from your back to your side while in a flat bed without using bedrails? 2  Help needed moving from lying on your back to sitting on the side of a flat bed without using bedrails? 2  Help needed moving to and from a bed to a chair (including a wheelchair)? 2  Help needed standing up from a chair using your arms (e.g., wheelchair or bedside chair)? 2  Help needed to walk in hospital room? 2  Help needed climbing 3-5 steps with a railing?  1  6 Click Score 11  Consider  Recommendation of Discharge To: CIR/SNF/LTACH  PT Recommendation  Follow Up Recommendations SNF  PT equipment None recommended by PT  Individuals Consulted  Consulted and Agree with Results and Recommendations Patient;Family member/caregiver  Family Member Consulted son  Acute Rehab PT Goals  Patient Stated Goal none stated  PT Goal Formulation With patient  Potential to Achieve Goals Good  PT Time Calculation  PT Start Time (ACUTE ONLY) 1110  PT Stop Time (ACUTE ONLY) 1140  PT Time Calculation (min) (ACUTE ONLY) 30 min  PT General Charges  $$ ACUTE PT VISIT 1 Visit  PT Evaluation  $PT Eval Moderate Complexity 1 Mod  PT Treatments  $Gait Training 8-22 mins  Written Expression  Dominant Hand Right    Mee Hives, PT MS Acute Rehab Dept. Number: [facility] [place]

## 2020-05-30 ENCOUNTER — Inpatient Hospital Stay (HOSPITAL_COMMUNITY): Payer: Medicare Other

## 2020-05-30 DIAGNOSIS — Z803 Family history of malignant neoplasm of breast: Secondary | ICD-10-CM | POA: Diagnosis not present

## 2020-05-30 DIAGNOSIS — K219 Gastro-esophageal reflux disease without esophagitis: Secondary | ICD-10-CM | POA: Diagnosis not present

## 2020-05-30 DIAGNOSIS — Z800 Family history of malignant neoplasm of digestive organs: Secondary | ICD-10-CM | POA: Diagnosis not present

## 2020-05-30 DIAGNOSIS — R4182 Altered mental status, unspecified: Secondary | ICD-10-CM | POA: Diagnosis present

## 2020-05-30 DIAGNOSIS — Z808 Family history of malignant neoplasm of other organs or systems: Secondary | ICD-10-CM | POA: Diagnosis not present

## 2020-05-30 DIAGNOSIS — E876 Hypokalemia: Secondary | ICD-10-CM | POA: Diagnosis not present

## 2020-05-30 DIAGNOSIS — M810 Age-related osteoporosis without current pathological fracture: Secondary | ICD-10-CM | POA: Diagnosis present

## 2020-05-30 DIAGNOSIS — K449 Diaphragmatic hernia without obstruction or gangrene: Secondary | ICD-10-CM | POA: Diagnosis present

## 2020-05-30 DIAGNOSIS — Z20822 Contact with and (suspected) exposure to covid-19: Secondary | ICD-10-CM | POA: Diagnosis present

## 2020-05-30 DIAGNOSIS — G9341 Metabolic encephalopathy: Secondary | ICD-10-CM | POA: Diagnosis not present

## 2020-05-30 DIAGNOSIS — E785 Hyperlipidemia, unspecified: Secondary | ICD-10-CM | POA: Diagnosis present

## 2020-05-30 DIAGNOSIS — R1084 Generalized abdominal pain: Secondary | ICD-10-CM

## 2020-05-30 DIAGNOSIS — H409 Unspecified glaucoma: Secondary | ICD-10-CM | POA: Diagnosis present

## 2020-05-30 DIAGNOSIS — Z96611 Presence of right artificial shoulder joint: Secondary | ICD-10-CM | POA: Diagnosis present

## 2020-05-30 DIAGNOSIS — N179 Acute kidney failure, unspecified: Secondary | ICD-10-CM | POA: Diagnosis not present

## 2020-05-30 DIAGNOSIS — M1990 Unspecified osteoarthritis, unspecified site: Secondary | ICD-10-CM | POA: Diagnosis present

## 2020-05-30 DIAGNOSIS — F0390 Unspecified dementia without behavioral disturbance: Secondary | ICD-10-CM | POA: Diagnosis present

## 2020-05-30 DIAGNOSIS — N1831 Chronic kidney disease, stage 3a: Secondary | ICD-10-CM | POA: Diagnosis present

## 2020-05-30 DIAGNOSIS — E872 Acidosis: Secondary | ICD-10-CM | POA: Diagnosis present

## 2020-05-30 DIAGNOSIS — R1110 Vomiting, unspecified: Secondary | ICD-10-CM | POA: Diagnosis not present

## 2020-05-30 DIAGNOSIS — Z8049 Family history of malignant neoplasm of other genital organs: Secondary | ICD-10-CM | POA: Diagnosis not present

## 2020-05-30 DIAGNOSIS — I160 Hypertensive urgency: Secondary | ICD-10-CM | POA: Diagnosis not present

## 2020-05-30 DIAGNOSIS — Z950 Presence of cardiac pacemaker: Secondary | ICD-10-CM | POA: Diagnosis not present

## 2020-05-30 DIAGNOSIS — G4733 Obstructive sleep apnea (adult) (pediatric): Secondary | ICD-10-CM | POA: Diagnosis present

## 2020-05-30 DIAGNOSIS — Z8249 Family history of ischemic heart disease and other diseases of the circulatory system: Secondary | ICD-10-CM | POA: Diagnosis not present

## 2020-05-30 DIAGNOSIS — I674 Hypertensive encephalopathy: Secondary | ICD-10-CM | POA: Diagnosis present

## 2020-05-30 DIAGNOSIS — I739 Peripheral vascular disease, unspecified: Secondary | ICD-10-CM | POA: Diagnosis present

## 2020-05-30 DIAGNOSIS — Z66 Do not resuscitate: Secondary | ICD-10-CM | POA: Diagnosis present

## 2020-05-30 LAB — COMPREHENSIVE METABOLIC PANEL
ALT: 20 U/L (ref 0–44)
AST: 40 U/L (ref 15–41)
Albumin: 3.6 g/dL (ref 3.5–5.0)
Alkaline Phosphatase: 32 U/L — ABNORMAL LOW (ref 38–126)
Anion gap: 10 (ref 5–15)
BUN: 11 mg/dL (ref 8–23)
CO2: 20 mmol/L — ABNORMAL LOW (ref 22–32)
Calcium: 9.7 mg/dL (ref 8.9–10.3)
Chloride: 110 mmol/L (ref 98–111)
Creatinine, Ser: 1.21 mg/dL — ABNORMAL HIGH (ref 0.44–1.00)
GFR calc Af Amer: 45 mL/min — ABNORMAL LOW (ref >60)
GFR calc non Af Amer: 39 mL/min — ABNORMAL LOW (ref >60)
Glucose, Bld: 112 mg/dL — ABNORMAL HIGH (ref 70–99)
Potassium: 4.1 mmol/L (ref 3.5–5.1)
Sodium: 140 mmol/L (ref 135–145)
Total Bilirubin: 1.6 mg/dL — ABNORMAL HIGH (ref 0.3–1.2)
Total Protein: 6.2 g/dL — ABNORMAL LOW (ref 6.5–8.1)

## 2020-05-30 LAB — URINE CULTURE: Culture: NO GROWTH

## 2020-05-30 LAB — CBC WITH DIFFERENTIAL/PLATELET
Abs Immature Granulocytes: 0.01 10*3/uL (ref 0.00–0.07)
Basophils Absolute: 0 10*3/uL (ref 0.0–0.1)
Basophils Relative: 0 %
Eosinophils Absolute: 0.1 10*3/uL (ref 0.0–0.5)
Eosinophils Relative: 1 %
HCT: 41.5 % (ref 36.0–46.0)
Hemoglobin: 14 g/dL (ref 12.0–15.0)
Immature Granulocytes: 0 %
Lymphocytes Relative: 25 %
Lymphs Abs: 2 10*3/uL (ref 0.7–4.0)
MCH: 32.1 pg (ref 26.0–34.0)
MCHC: 33.7 g/dL (ref 30.0–36.0)
MCV: 95.2 fL (ref 80.0–100.0)
Monocytes Absolute: 0.7 10*3/uL (ref 0.1–1.0)
Monocytes Relative: 9 %
Neutro Abs: 5.1 10*3/uL (ref 1.7–7.7)
Neutrophils Relative %: 65 %
Platelets: 260 10*3/uL (ref 150–400)
RBC: 4.36 MIL/uL (ref 3.87–5.11)
RDW: 13.5 % (ref 11.5–15.5)
WBC: 7.9 10*3/uL (ref 4.0–10.5)
nRBC: 0 % (ref 0.0–0.2)

## 2020-05-30 LAB — LACTIC ACID, PLASMA: Lactic Acid, Venous: 1 mmol/L (ref 0.5–1.9)

## 2020-05-30 LAB — MAGNESIUM: Magnesium: 1.8 mg/dL (ref 1.7–2.4)

## 2020-05-30 MED ORDER — SODIUM CHLORIDE 0.9 % IV SOLN: INTRAVENOUS | Status: DC

## 2020-05-30 MED ORDER — METOPROLOL SUCCINATE ER 100 MG PO TB24
100.0000 mg | ORAL_TABLET | Freq: Every day | ORAL | Status: DC
  Administered 2020-05-30 – 2020-06-01 (×3): 100 mg via ORAL
  Filled 2020-05-30 (×4): qty 1

## 2020-05-30 MED ORDER — IOHEXOL 300 MG/ML  SOLN
80.0000 mL | Freq: Once | INTRAMUSCULAR | Status: AC | PRN
  Administered 2020-05-30: 80 mL via INTRAVENOUS

## 2020-05-30 MED ORDER — SODIUM CHLORIDE 0.9 % IV BOLUS
500.0000 mL | Freq: Once | INTRAVENOUS | Status: AC
  Administered 2020-05-30: 500 mL via INTRAVENOUS

## 2020-05-30 MED ORDER — AMLODIPINE BESYLATE 2.5 MG PO TABS
2.5000 mg | ORAL_TABLET | Freq: Every evening | ORAL | Status: DC
  Administered 2020-05-30 – 2020-05-31 (×2): 2.5 mg via ORAL
  Filled 2020-05-30 (×2): qty 1

## 2020-05-30 NOTE — Progress Notes (Signed)
No urine output noted, bladder scan done and 32ml.noted

## 2020-05-30 NOTE — TOC Initial Note (Signed)
Transition of Care [facility] - Initial/Assessment Note    Patient Details  Name: Julie Bass MRN: [id number] Date of Birth: 09-Jan-1926  Transition of Care [facility]) CM/SW Contact:    Alexander Mt, LCSW Phone Number: 05/30/2020, 10:42 AM  Clinical Narrative:                 CSW spoke with pt daughter [name] via telephone at [phone]. Introduced self, role, reason for call. Confirmed home address and PCP. Pt from home with her nephew and grandson, [name] is an only child and also visits and helps pt daily with what she needs. Pt usually uses a rollator at baseline and [name] has a two wheel walker available too. We discussed recommendations for SNF, pt daughter would like pt to return home with [facility] when ready, they have used [facility] in the past. She also gives permission for pt to have PCP appointment made, in anticipation for the weekend CSW has scheduled this for 7/27 at 10:30am.   Referral called to [facility] with Alvis Lemmings who can accept for PT/OT. TOC team will follow.   Expected Discharge Plan: [place] Barriers to Discharge: Continued Medical Work up   Patient Goals and CMS Choice Patient states their goals for this hospitalization and ongoing recovery are:: return home with home health/have her continue therapies CMS Medicare.gov Compare Post Acute Care list provided to:: Patient Represenative (must comment) (pt daughter [name]) Choice offered to / list presented to : Adult Children  Expected Discharge Plan and Services Expected Discharge Plan: [place] In-house Referral: Clinical Social Work Discharge Planning Services: CM Consult, Follow-up appt scheduled Post Acute Care Choice: [place] arrangements for the past 2 months: [place] Arranged: OT, PT [place] Agency: [place] Date [place]: 05/30/20 Time [place]: 1042 Representative spoke with at [place]: Adela Lank  Prior Living  Arrangements/Services Living arrangements for the past 2 months: [place] Lives with:: Relatives Patient language and need for interpreter reviewed:: Yes (no needs) Do you feel safe going back to the place where you live?: Yes      Need for Family Participation in Patient Care: Yes (Comment) Care giver support system in place?: Yes (comment) Current home services: DME Criminal Activity/Legal Involvement Pertinent to Current Situation/Hospitalization: No - Comment as needed  Permission Sought/Granted Permission sought to share information with : Family Supports Permission granted to share information with : No (pt with fluctuating orientation)  Share Information with NAME: Barry Brunner  Permission granted to share info w AGENCY: [place] granted to share info w Relationship: daughter  Permission granted to share info w Contact Information: [phone]  Emotional Assessment Appearance:: Other (Comment Required (telephonic assessment w/ pt daughter) Attitude/Demeanor/Rapport: Other (comment) (telephonic assessment w/ pt daughter) Affect (typically observed): Other (comment) (telephonic assessment w/ pt daughter) Orientation: : Oriented to Self, Oriented to Place, Oriented to Situation, Fluctuating Orientation (Suspected and/or reported Sundowners) Alcohol / Substance Use: Not Applicable Psych Involvement: No (comment)  Admission diagnosis:  Altered mental status, unspecified altered mental status type [G28.36] Acute metabolic encephalopathy [O29.47] Patient Active Problem List   Diagnosis Date Noted  . Acute metabolic encephalopathy 65/46/5035  . Lactic acidosis 05/29/2020  . Hypertensive urgency 05/29/2020  . Hiatal hernia 09/18/2019  . Peripheral artery disease ([place]) 09/18/2019  . Acute renal failure superimposed on stage 3a chronic kidney disease ([place]) 09/18/2019  . Essential hypertension 09/18/2019  . Neck mass 09/18/2019  .  Weakness 09/17/2019  .  Pacemaker 05/28/2019  . Bilateral sensorineural hearing loss 05/09/2018  . CHB (complete heart block) ([place]) 01/20/2018  . OSA (obstructive sleep apnea) 01/20/2018  . 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 GERD without esophagitis 01/08/2018  . CKD (chronic kidney disease), stage III 01/07/2018  . Lower urinary tract infectious disease 06/16/2016  . Warthin's tumor 01/01/2016  . Dementia ([place]) 03/14/2015  . Orthostatic hypotension 08/27/2014  . Physical deconditioning 08/27/2014  . Abdominal pain in female 08/27/2014  . Osteoarthrosis, unspecified whether generalized or localized, shoulder region 01/26/2013  . Chest wall pain 12/26/2012  . NSVT (nonsustained ventricular tachycardia) ([place]) 10/18/2011  . HTN (hypertension) 10/18/2011  . HLD (hyperlipidemia) 10/18/2011  . PAD (peripheral artery disease) ([place]) 10/18/2011  . Syncope 09/30/2011  . Pelvic joint pain 09/30/2011  . Back pain 09/30/2011   PCP:  Denita Lung, MD Pharmacy:   [id number] [place], [place] [address] Phone: [phone] Fax: [phone]  Readmission Risk Interventions No flowsheet data found.

## 2020-05-30 NOTE — Progress Notes (Addendum)
PROGRESS NOTE                                                                                                                                                                                                             Patient Demographics:    Julie Bass, is a 86 y.o. female, DOB - 05/26/1926, KNL:[id number]  Admit date - 05/28/2020   Admitting Physician Vernelle Emerald, MD  Outpatient Primary MD for the patient is Denita Lung, MD  LOS - 0   Chief Complaint  Patient presents with  . Urinary Tract Infection       Brief Narrative   86 year old female with past medical history of dementia, COVID-19 infection (01/2020), hypertension, chronic kidney disease stage III, hyperlipidemia, gastroesophageal reflux disease who presents to [facility] emergency department with family due to concerns for acute confusion.  Patient is an extremely poor historian due to acute confusion.  The majority the history has been obtained from the daughter via phone conversation.  Daughter explains that approximately 2 days ago the patient began to exhibit confusion.  Patient "was not making any sense when talking."  Patient also had a substantially worsened appetite.  Upon further questioning the daughter denies any slurring of speech facial droop or focal weakness in the patient.  Patient has not had any sick contacts or fevers as of late.    Upon further questioning it turns out that the patient has been complaining of vague generalized abdominal pain for approximately 1 week prior to the onset of her confusion.  Patient has not exhibited any diarrhea nausea or vomiting.  The daughter, concern for a recurrent urinary tract infection this patient started the patient on an old supply of Bactrim despite the patient not having been seen by a medical provider or having an appropriate work-up.  Due to lack of improvement in symptoms after 3 doses of Bactrim  the daughter brought the patient into [facility] emergency department for evaluation.  Upon evaluation in the emergency department the patient is visibly confused and not consistently following commands.  Patient is also been found to be in hypertensive urgency.  Patient has been found to have a substantial lactic acidosis of 4.1.  Due to concerns for acute encephalopathy and possible underlying infectious process the hospitalist group has been called to assess the  patient for admission the hospital.   Subjective:    Julie Bass today mentation has improved, she is more coherent today, but she is with very poor oral intake, have discussed with staff, she has not taken any breakfast or lunch today .   Assessment  & Plan :    Principal Problem:   Acute metabolic encephalopathy Active Problems:   GERD without esophagitis   Acute renal failure superimposed on stage 3a chronic kidney disease (HCC)   Lactic acidosis   Hypertensive urgency    Acute metabolic encephalopathy - Patient exhibiting 2 days of acute confusion according to family.   - Physical examination is nonfocal with CT imaging of the head not revealing any acute disease. -No clear etiology, no evidence of infectious process, no significant electrolyte abnormalities, . -Possibly related to hypertensive encephalopathy, as mentation has improved today once her blood pressure has improved . -Normal B12 and TSH level. -Continue with empiric antibiotic coverage given lactic acidosis on presentation, please see discussion below under nausea and abdominal pain.  Nausea/abdominal pain -Patient has been complaining of nausea, and abdominal pain, even though abdominal exam is benign, this appears to be more as a chronic problem, but she is with no oral intake today, she did refuse her breakfast and lunch, so I will go ahead and proceed with CT abdomen pelvis with IV and p.o. contrast for further evaluation, and will start on IV  fluids as well for appropriate hydration.  Acute kidney injury superimposed on chronic kidney disease stage III likely secondary to poor oral intake as of late due to acute confusion and abdominal pain  Hydrating patient gently with intravenous isotonic fluids  Strict input and output monitoring  Monitoring renal function and electrolytes with serial chemistries  Minimizing nephrotoxic agents    Lactic acidosis   Markedly elevated lactic acid of 4.1 Pete remains elevated despite appropriate hydration at 4.3, continue with IV hydration, and continue with antibiotic coverage.  Possibly secondary to significant volume depletion or underlying infectious process  Empiric antibiotic therapy with intravenous ceftriaxone for now  Considering patient's vague complaints of generalized abdominal pain over the past week, if patient continues to exhibit lactic acidosis and abdominal pain will obtain CT imaging of the abdomen and pelvis with intravenous contrast within the next 24 hours as renal function allows.  Hydrating patient with intravenous isotonic fluids  Resolved, lactic acid within normal limit today    Hypertensive urgency   Patient presenting with substantial hypertension with systolic blood pressures consistently above 200  Hypertensive urgency/emergency could be contributing to patient's confusion in the form of hypertensive encephalopathy  Admitting provider discussed antihypertensive regimen with daughter, it seems that several of the patient's antihypertensives were discontinued several months ago due to low blood pressure  Is resumed back on her home medication, as well she was started on as needed IV hydralazine, she did require x2 doses already since this a.m., but currently blood pressure has been acceptable and her oral regimen and as needed hydralazine .  -Patient back on her metoprolol, blood pressure remains uncontrolled, so I have resumed her on Norvasc evening  time.    GERD without esophagitis  Continue home regimen of PPI   COVID-19 Labs  Recent Labs    05/29/20 0733  CRP <0.5    Lab Results  Component Value Date   SARSCOV2NAA NEGATIVE 05/29/2020   SARSCOV2NAA POSITIVE (A) 09/16/2019     Code Status : DNR  Family Communication  : none at bedside,  I have left her daughter in a voicemail  Disposition Plan  :  Status is: Observation  The patient will require care spanning > 2 midnights and should be moved to inpatient because: IV treatments appropriate due to intensity of illness or inability to take PO  Dispo: The patient is from: Home              Anticipated d/c is to: Home              Anticipated d/c date is: 1 day              Patient currently is not medically stable to d/c.  Patient remains with very poor oral intake, requiring IV fluids and further work-up.       Consults  :  none  Procedures  : None  DVT Prophylaxis  :  [place] Lovenox  Lab Results  Component Value Date   PLT 260 05/30/2020    Antibiotics  :    Anti-infectives (From admission, onward)   Start     Dose/Rate Route Frequency Ordered Stop   05/29/20 1400  cefTRIAXone (ROCEPHIN) 1 g in sodium chloride 0.9 % 100 mL IVPB     Discontinue     1 g 200 mL/hr over 30 Minutes Intravenous Every 24 hours 05/29/20 0701     05/29/20 0530  vancomycin (VANCOCIN) IVPB 1000 mg/200 mL premix  Status:  Discontinued        1,000 mg 200 mL/hr over 60 Minutes Intravenous  Once 05/29/20 0517 05/29/20 0649   05/29/20 0530  ceFEPIme (MAXIPIME) 2 g in sodium chloride 0.9 % 100 mL IVPB        2 g 200 mL/hr over 30 Minutes Intravenous  Once 05/29/20 0517 05/29/20 0620   05/29/20 0515  metroNIDAZOLE (FLAGYL) IVPB 500 mg        500 mg 100 mL/hr over 60 Minutes Intravenous  Once 05/29/20 0513 05/29/20 0646        Objective:   Vitals:   05/30/20 0254 05/30/20 0610 05/30/20 1022 05/30/20 1435  BP: (!) 143/87 (!) 171/66 (!) 126/58 (!) 125/48  Pulse: 84 87 76 80    Resp: 15 14 16 17   Temp: 99.1 F (37.3 C) 98.9 F (37.2 C) 98.9 F (37.2 C) 98.8 F (37.1 C)  TempSrc: Oral Oral    SpO2: 95% 97% 96% 97%  Weight:      Height:        Wt Readings from Last 3 Encounters:  05/28/20 52 kg  09/16/19 51.7 kg  08/06/19 52.6 kg     Intake/Output Summary (Last 24 hours) at 05/30/2020 1449 Last data filed at 05/30/2020 0610 Gross per 24 hour  Intake 420 ml  Output 300 ml  Net 120 ml     Physical Exam  Awake Alert, pleasant, remains confused, but mentation has improved today. Symmetrical Chest wall movement, Good air movement bilaterally, CTAB RRR,No Gallops,Rubs or new Murmurs, No Parasternal Heave +ve B.Sounds, Abd Soft, No tenderness, No rebound - guarding or rigidity. No Cyanosis, Clubbing or edema, No new Rash or bruise      Data Review:    CBC Recent Labs  Lab 05/28/20 2101 05/29/20 1005 05/30/20 0212  WBC 8.3 10.9* 7.9  HGB 15.4* 15.7* 14.0  HCT 46.9* 47.7* 41.5  PLT 251 206 260  MCV 96.1 96.0 95.2  MCH 31.6 31.6 32.1  MCHC 32.8 32.9 33.7  RDW 13.2 13.3 13.5  LYMPHSABS  --  1.7 2.0  MONOABS  --  1.1* 0.7  EOSABS  --  0.0 0.1  BASOSABS  --  0.1 0.0    Chemistries  Recent Labs  Lab 05/28/20 2101 05/29/20 0919 05/30/20 0212  NA 138 139 140  K 3.8 3.2* 4.1  CL 105 107 110  CO2 21* 19* 20*  GLUCOSE 160* 135* 112*  BUN 14 11 11   CREATININE 1.43* 1.41* 1.21*  CALCIUM 10.6* 9.8 9.7  MG  --  1.6* 1.8  AST 34 41 40  ALT 24 24 20   ALKPHOS 40 38 32*  BILITOT 1.1 1.2 1.6*   ------------------------------------------------------------------------------------------------------------------ No results for input(s): CHOL, HDL, LDLCALC, TRIG, CHOLHDL, LDLDIRECT in the last 72 hours.  Lab Results  Component Value Date   HGBA1C 5.1 01/08/2018   ------------------------------------------------------------------------------------------------------------------ Recent Labs    05/29/20 0733  TSH 1.974    ------------------------------------------------------------------------------------------------------------------ Recent Labs    05/29/20 0733  VITAMINB12 883  FOLATE QUANTITY NOT SUFFICIENT, UNABLE TO PERFORM TEST    Coagulation profile No results for input(s): INR, PROTIME in the last 168 hours.  No results for input(s): DDIMER in the last 72 hours.  Cardiac Enzymes No results for input(s): CKMB, TROPONINI, MYOGLOBIN in the last 168 hours.  Invalid input(s): CK ------------------------------------------------------------------------------------------------------------------ No results found for: BNP  Inpatient Medications  Scheduled Meds: . amLODipine  2.5 mg Oral QPM  . cilostazol  50 mg Oral BID  . citalopram  10 mg Oral Daily  . enoxaparin (LOVENOX) injection  30 mg Subcutaneous Q24H  . ezetimibe  10 mg Oral Daily  . feeding supplement (ENSURE ENLIVE)  237 mL Oral TID BM  . geriatric multivitamins-minerals  15 mL Oral Daily  . metoprolol succinate  100 mg Oral Q breakfast  . omega-3 acid ethyl esters  1 g Oral QPM  . pantoprazole  40 mg Oral Daily  . timolol  1 drop Both Eyes BID   Continuous Infusions: . sodium chloride 50 mL/hr at 05/30/20 0957  . cefTRIAXone (ROCEPHIN)  IV 1 g (05/30/20 1336)   PRN Meds:.acetaminophen **OR** acetaminophen, hydrALAZINE, ondansetron **OR** ondansetron (ZOFRAN) IV, polyethylene glycol  Micro Results Recent Results (from the past 240 hour(s))  Blood culture (routine x 2)     Status: None (Preliminary result)   Collection Time: 05/29/20  4:15 AM   Specimen: BLOOD  Result Value Ref Range Status   Specimen Description BLOOD LEFT ARM  Final   Special Requests   Final    BOTTLES DRAWN AEROBIC AND ANAEROBIC Blood Culture adequate volume   Culture   Final    NO GROWTH 1 DAY Performed at [facility], [place] [address], [place], [place] [address]    Report Status PENDING  Incomplete  Blood culture (routine x 2)     Status:  None (Preliminary result)   Collection Time: 05/29/20  4:20 AM   Specimen: BLOOD  Result Value Ref Range Status   Specimen Description BLOOD RIGHT ARM  Final   Special Requests   Final    BOTTLES DRAWN AEROBIC AND ANAEROBIC Blood Culture results may not be optimal due to an inadequate volume of blood received in culture bottles   Culture   Final    NO GROWTH 1 DAY Performed at [facility], [place] [address] [place], [place] [address]    Report Status PENDING  Incomplete  Urine culture     Status: None   Collection Time: 05/29/20  4:26 AM   Specimen: Urine, Random  Result Value  Ref Range Status   Specimen Description URINE, RANDOM  Final   Special Requests NONE  Final   Culture   Final    NO GROWTH Performed at [facility], [address], [place], [place] [address]    Report Status 05/30/2020 FINAL  Final  SARS Coronavirus 2 by RT PCR (hospital order, performed in [facility] hospital lab) Nasopharyngeal Nasopharyngeal Swab     Status: None   Collection Time: 05/29/20  4:27 AM   Specimen: Nasopharyngeal Swab  Result Value Ref Range Status   SARS Coronavirus 2 NEGATIVE NEGATIVE Final    Comment: (NOTE) SARS-CoV-2 target nucleic acids are NOT DETECTED.  The SARS-CoV-2 RNA is generally detectable in upper and lower respiratory specimens during the acute phase of infection. The lowest concentration of SARS-CoV-2 viral copies this assay can detect is 250 copies / mL. A negative result does not preclude SARS-CoV-2 infection and should not be used as the sole basis for treatment or other patient management decisions.  A negative result may occur with improper specimen collection / handling, submission of specimen other than nasopharyngeal swab, presence of viral mutation(s) within the areas targeted by this assay, and inadequate number of viral copies (<250 copies / mL). A negative result must be combined with clinical observations, patient history, and epidemiological  information.  Fact Sheet for Patients:   StrictlyIdeas.no  Fact Sheet for Healthcare Providers: BankingDealers.co.za  This test is not yet approved or  cleared by the [place] FDA and has been authorized for detection and/or diagnosis of SARS-CoV-2 by FDA under an Emergency Use Authorization (EUA).  This EUA will remain in effect (meaning this test can be used) for the duration of the COVID-19 declaration under Section 564(b)(1) of the Act, 21 U.S.C. section 360bbb-3(b)(1), unless the authorization is terminated or revoked sooner.  Performed at [facility], [place] [address], [place], [place] [address]     Radiology Reports CT Head Wo Contrast  Result Date: 05/29/2020 CLINICAL DATA:  Encephalopathy. EXAM: CT HEAD WITHOUT CONTRAST TECHNIQUE: Contiguous axial images were obtained from the base of the skull through the vertex without intravenous contrast. COMPARISON:  09/17/2019 FINDINGS: Brain: Stable age related cerebral atrophy, ventriculomegaly and periventricular white matter disease. No extra-axial fluid collections are identified. No CT findings for acute hemispheric infarction or intracranial hemorrhage. No mass lesions. The brainstem and cerebellum are normal. Vascular: Stable advanced vascular calcifications but no definite aneurysm or hyperdense vessels. Skull: No skull fracture or bone lesions. Sinuses/Orbits: The paranasal sinuses and mastoid air cells are clear except for some mucoperiosteal thickening in the right maxillary sinus. The globes are intact. Other: No scalp lesions or scalp hematoma. IMPRESSION: 1. Stable age related cerebral atrophy, ventriculomegaly and periventricular white matter disease. 2. No acute intracranial findings or mass lesions. Electronically Signed   By: Marijo Sanes M.D.   On: 05/29/2020 05:16   DG Chest Port 1 View  Result Date: 05/29/2020 CLINICAL DATA:  Altered mental status EXAM: PORTABLE  CHEST 1 VIEW COMPARISON:  09/16/2019 FINDINGS: Cardiomegaly and aortic tortuosity. Dual-chamber pacer leads in unremarkable position. Mitral annular calcification. There is no edema, consolidation, effusion, or pneumothorax. Right glenohumeral arthroplasty, reversed. There is osteopenia. Cholecystectomy clips. IMPRESSION: No evidence of acute disease. Electronically Signed   By: Monte Fantasia M.D.   On: 05/29/2020 04:24     Phillips Climes M.D on 05/30/2020 at 2:49 PM    [facility] -  Office  [phone]

## 2020-05-31 DIAGNOSIS — N179 Acute kidney failure, unspecified: Secondary | ICD-10-CM

## 2020-05-31 DIAGNOSIS — N1831 Chronic kidney disease, stage 3a: Secondary | ICD-10-CM

## 2020-05-31 DIAGNOSIS — E872 Acidosis: Secondary | ICD-10-CM

## 2020-05-31 LAB — CBC
HCT: 42 % (ref 36.0–46.0)
Hemoglobin: 13.6 g/dL (ref 12.0–15.0)
MCH: 31.6 pg (ref 26.0–34.0)
MCHC: 32.4 g/dL (ref 30.0–36.0)
MCV: 97.7 fL (ref 80.0–100.0)
Platelets: 212 10*3/uL (ref 150–400)
RBC: 4.3 MIL/uL (ref 3.87–5.11)
RDW: 13.6 % (ref 11.5–15.5)
WBC: 7.5 10*3/uL (ref 4.0–10.5)
nRBC: 0 % (ref 0.0–0.2)

## 2020-05-31 LAB — HEPATIC FUNCTION PANEL
ALT: 20 U/L (ref 0–44)
AST: 29 U/L (ref 15–41)
Albumin: 3.3 g/dL — ABNORMAL LOW (ref 3.5–5.0)
Alkaline Phosphatase: 32 U/L — ABNORMAL LOW (ref 38–126)
Bilirubin, Direct: 0.1 mg/dL (ref 0.0–0.2)
Indirect Bilirubin: 1 mg/dL — ABNORMAL HIGH (ref 0.3–0.9)
Total Bilirubin: 1.1 mg/dL (ref 0.3–1.2)
Total Protein: 5.9 g/dL — ABNORMAL LOW (ref 6.5–8.1)

## 2020-05-31 LAB — LIPASE, BLOOD: Lipase: 72 U/L — ABNORMAL HIGH (ref 11–51)

## 2020-05-31 LAB — BASIC METABOLIC PANEL
Anion gap: 9 (ref 5–15)
BUN: 14 mg/dL (ref 8–23)
CO2: 19 mmol/L — ABNORMAL LOW (ref 22–32)
Calcium: 9.3 mg/dL (ref 8.9–10.3)
Chloride: 110 mmol/L (ref 98–111)
Creatinine, Ser: 1.31 mg/dL — ABNORMAL HIGH (ref 0.44–1.00)
GFR calc Af Amer: 41 mL/min — ABNORMAL LOW (ref >60)
GFR calc non Af Amer: 35 mL/min — ABNORMAL LOW (ref >60)
Glucose, Bld: 155 mg/dL — ABNORMAL HIGH (ref 70–99)
Potassium: 3.3 mmol/L — ABNORMAL LOW (ref 3.5–5.1)
Sodium: 138 mmol/L (ref 135–145)

## 2020-05-31 LAB — PHOSPHORUS: Phosphorus: 2.4 mg/dL — ABNORMAL LOW (ref 2.5–4.6)

## 2020-05-31 LAB — MAGNESIUM: Magnesium: 1.7 mg/dL (ref 1.7–2.4)

## 2020-05-31 MED ORDER — PROSOURCE PLUS PO LIQD
30.0000 mL | Freq: Two times a day (BID) | ORAL | Status: DC
  Administered 2020-05-31 – 2020-06-01 (×3): 30 mL via ORAL
  Filled 2020-05-31 (×2): qty 30

## 2020-05-31 MED ORDER — MIRTAZAPINE 15 MG PO TBDP
15.0000 mg | ORAL_TABLET | Freq: Every day | ORAL | Status: DC
  Administered 2020-05-31: 15 mg via ORAL
  Filled 2020-05-31 (×2): qty 1

## 2020-05-31 MED ORDER — POTASSIUM CHLORIDE CRYS ER 20 MEQ PO TBCR
40.0000 meq | EXTENDED_RELEASE_TABLET | Freq: Once | ORAL | Status: AC
  Administered 2020-05-31: 40 meq via ORAL
  Filled 2020-05-31: qty 2

## 2020-05-31 MED ORDER — K PHOS MONO-SOD PHOS DI & MONO 155-852-130 MG PO TABS
500.0000 mg | ORAL_TABLET | Freq: Two times a day (BID) | ORAL | Status: AC
  Administered 2020-05-31 (×2): 500 mg via ORAL
  Filled 2020-05-31 (×2): qty 2

## 2020-05-31 NOTE — Progress Notes (Signed)
PROGRESS NOTE                                                                                                                                                                                                             Patient Demographics:    Julie Bass, is a 86 y.o. female, DOB - Mar 31, 1926, WJX:[id number]  Admit date - 05/28/2020   Admitting Physician Vernelle Emerald, MD  Outpatient Primary MD for the patient is Denita Lung, MD  LOS - 1   Chief Complaint  Patient presents with  . Urinary Tract Infection       Brief Narrative   86 year old female with past medical history of dementia, COVID-19 infection (01/2020), hypertension, chronic kidney disease stage III, hyperlipidemia, gastroesophageal reflux disease who presents to [facility] emergency department with family due to concerns for acute confusion.  Patient is an extremely poor historian due to acute confusion.  The majority the history has been obtained from the daughter via phone conversation.  Daughter explains that approximately 2 days ago the patient began to exhibit confusion.  Patient "was not making any sense when talking."  Patient also had a substantially worsened appetite.  Upon further questioning the daughter denies any slurring of speech facial droop or focal weakness in the patient.  Patient has not had any sick contacts or fevers as of late.    Upon further questioning it turns out that the patient has been complaining of vague generalized abdominal pain for approximately 1 week prior to the onset of her confusion.  Patient has not exhibited any diarrhea nausea or vomiting.  The daughter, concern for a recurrent urinary tract infection this patient started the patient on an old supply of Bactrim despite the patient not having been seen by a medical provider or having an appropriate work-up.  Due to lack of improvement in symptoms after 3 doses of Bactrim  the daughter brought the patient into [facility] emergency department for evaluation.  Upon evaluation in the emergency department the patient is visibly confused and not consistently following commands.  Patient is also been found to be in hypertensive urgency.  Patient has been found to have a substantial lactic acidosis of 4.1.  Due to concerns for acute encephalopathy and possible underlying infectious process the hospitalist group has been called to assess the  patient for admission the hospital.   Subjective:    Julie Bass today mentation has significantly improved, she had some urine retention yesterday required 1 time in and out, appetite remains poor today as well as discussed with staff.   Assessment  & Plan :    Principal Problem:   Acute metabolic encephalopathy Active Problems:   GERD without esophagitis   Acute renal failure superimposed on stage 3a chronic kidney disease (HCC)   Lactic acidosis   Hypertensive urgency    Acute metabolic encephalopathy - Patient exhibiting 2 days of acute confusion according to family.   - Physical examination is nonfocal with CT imaging of the head not revealing any acute disease. -No clear etiology, no evidence of infectious process, no significant electrolyte abnormalities, . -Possibly related to hypertensive encephalopathy, as mentation has improved today once her blood pressure has improved . -Normal B12 and TSH level. -Continue with empiric antibiotic coverage given lactic acidosis on presentation, please see discussion below under nausea and abdominal pain.  Nausea/abdominal pain -Patient does report nausea, abdominal pain presentation, this appears to be resolved today, but overall very poor oral intake, so CT abdomen pelvis was obtained, with no acute findings . -Patient with very poor appetite, but she has been tolerating her Ensure, I have discussed with the daughter, she has poor appetite at baseline, so I will start on  Remeron, will add prostat to her Ensure . -Lipase elevated at 77, no evidence of acute pancreatitis on imaging, LFTs within normal limits, no abdominal pain currently, she has some mild nausea, I will continue with IV fluids, and repeat lipase level tomorrow, very low clinical suspicion for acute pancreatitis at this point .   Acute kidney injury superimposed on chronic kidney disease stage III likely secondary to poor oral intake as of late due to acute confusion and abdominal pain  Hydrating patient gently with intravenous isotonic fluids  Strict input and output monitoring  Monitoring renal function and electrolytes with serial chemistries  Minimizing nephrotoxic agents    Lactic acidosis -Has resolved with hydration, no evidence of infectious process    Hypertensive urgency - Patient presenting with substantial hypertension with systolic blood pressures consistently above 200  - Hypertensive urgency/emergency could be contributing to patient's confusion in the form of hypertensive encephalopathy -Currently appears to be controlled on current regimen of metoprolol every morning, and low-dose amlodipine QPM.    GERD without esophagitis  Continue home regimen of PPI   COVID-19 Labs  Recent Labs    05/29/20 0733  CRP <0.5    Lab Results  Component Value Date   SARSCOV2NAA NEGATIVE 05/29/2020   SARSCOV2NAA POSITIVE (A) 09/16/2019     Code Status : DNR  Family Communication  : Discussed with daughter via phone  Disposition Plan  :  Status is: Observation  The patient will require care spanning > 2 midnights and should be moved to inpatient because: IV treatments appropriate due to intensity of illness or inability to take PO  Dispo: The patient is from: Home              Anticipated d/c is to: Home              Anticipated d/c date is: 1 day              Patient currently is not medically stable to d/c.  Patient remains with very poor oral intake, requiring IV  fluids and further work-up.  Consults  :  none  Procedures  : None  DVT Prophylaxis  :  [place] Lovenox  Lab Results  Component Value Date   PLT 212 05/31/2020    Antibiotics  :    Anti-infectives (From admission, onward)   Start     Dose/Rate Route Frequency Ordered Stop   05/29/20 1400  cefTRIAXone (ROCEPHIN) 1 g in sodium chloride 0.9 % 100 mL IVPB     Discontinue     1 g 200 mL/hr over 30 Minutes Intravenous Every 24 hours 05/29/20 0701     05/29/20 0530  vancomycin (VANCOCIN) IVPB 1000 mg/200 mL premix  Status:  Discontinued        1,000 mg 200 mL/hr over 60 Minutes Intravenous  Once 05/29/20 0517 05/29/20 0649   05/29/20 0530  ceFEPIme (MAXIPIME) 2 g in sodium chloride 0.9 % 100 mL IVPB        2 g 200 mL/hr over 30 Minutes Intravenous  Once 05/29/20 0517 05/29/20 0620   05/29/20 0515  metroNIDAZOLE (FLAGYL) IVPB 500 mg        500 mg 100 mL/hr over 60 Minutes Intravenous  Once 05/29/20 0513 05/29/20 0646        Objective:   Vitals:   05/30/20 1022 05/30/20 1435 05/30/20 2106 05/31/20 0519  BP: (!) 126/58 (!) 125/48 (!) 126/51 (!) 125/49  Pulse: 76 80 81 82  Resp: 16 17 17 17   Temp: 98.9 F (37.2 C) 98.8 F (37.1 C) 98.6 F (37 C) 98.4 F (36.9 C)  TempSrc:   Oral Oral  SpO2: 96% 97% 98% 98%  Weight:      Height:        Wt Readings from Last 3 Encounters:  05/28/20 52 kg  09/16/19 51.7 kg  08/06/19 52.6 kg     Intake/Output Summary (Last 24 hours) at 05/31/2020 1450 Last data filed at 05/31/2020 0500 Gross per 24 hour  Intake 782.18 ml  Output 850 ml  Net -67.82 ml     Physical Exam  Awake Alert, pleasant, remains confused, but mentation has improved today. Awake Alert, Oriented X 3, No new F.N deficits, Normal affect Symmetrical Chest wall movement, Good air movement bilaterally, CTAB RRR,No Gallops,Rubs or new Murmurs, No Parasternal Heave +ve B.Sounds, Abd Soft, No tenderness, No rebound - guarding or rigidity. No Cyanosis, Clubbing  or edema, No new Rash or bruise       Data Review:    CBC Recent Labs  Lab 05/28/20 2101 05/29/20 1005 05/30/20 0212 05/31/20 1152  WBC 8.3 10.9* 7.9 7.5  HGB 15.4* 15.7* 14.0 13.6  HCT 46.9* 47.7* 41.5 42.0  PLT 251 206 260 212  MCV 96.1 96.0 95.2 97.7  MCH 31.6 31.6 32.1 31.6  MCHC 32.8 32.9 33.7 32.4  RDW 13.2 13.3 13.5 13.6  LYMPHSABS  --  1.7 2.0  --   MONOABS  --  1.1* 0.7  --   EOSABS  --  0.0 0.1  --   BASOSABS  --  0.1 0.0  --     Chemistries  Recent Labs  Lab 05/28/20 2101 05/29/20 0919 05/30/20 0212 05/31/20 1152  NA 138 139 140 138  K 3.8 3.2* 4.1 3.3*  CL 105 107 110 110  CO2 21* 19* 20* 19*  GLUCOSE 160* 135* 112* 155*  BUN 14 11 11 14   CREATININE 1.43* 1.41* 1.21* 1.31*  CALCIUM 10.6* 9.8 9.7 9.3  MG  --  1.6* 1.8 1.7  AST 34 41 40  29  ALT 24 24 20 20   ALKPHOS 40 38 32* 32*  BILITOT 1.1 1.2 1.6* 1.1   ------------------------------------------------------------------------------------------------------------------ No results for input(s): CHOL, HDL, LDLCALC, TRIG, CHOLHDL, LDLDIRECT in the last 72 hours.  Lab Results  Component Value Date   HGBA1C 5.1 01/08/2018   ------------------------------------------------------------------------------------------------------------------ Recent Labs    05/29/20 0733  TSH 1.974   ------------------------------------------------------------------------------------------------------------------ Recent Labs    05/29/20 0733  VITAMINB12 883  FOLATE QUANTITY NOT SUFFICIENT, UNABLE TO PERFORM TEST    Coagulation profile No results for input(s): INR, PROTIME in the last 168 hours.  No results for input(s): DDIMER in the last 72 hours.  Cardiac Enzymes No results for input(s): CKMB, TROPONINI, MYOGLOBIN in the last 168 hours.  Invalid input(s): CK ------------------------------------------------------------------------------------------------------------------ No results found for: BNP   Inpatient Medications  Scheduled Meds: . (feeding supplement) PROSource Plus  30 mL Oral BID BM  . amLODipine  2.5 mg Oral QPM  . cilostazol  50 mg Oral BID  . citalopram  10 mg Oral Daily  . enoxaparin (LOVENOX) injection  30 mg Subcutaneous Q24H  . ezetimibe  10 mg Oral Daily  . feeding supplement (ENSURE ENLIVE)  237 mL Oral TID BM  . geriatric multivitamins-minerals  15 mL Oral Daily  . metoprolol succinate  100 mg Oral Q breakfast  . mirtazapine  15 mg Oral QHS  . omega-3 acid ethyl esters  1 g Oral QPM  . pantoprazole  40 mg Oral Daily  . phosphorus  500 mg Oral BID  . potassium chloride  40 mEq Oral Once  . timolol  1 drop Both Eyes BID   Continuous Infusions: . sodium chloride 75 mL/hr at 05/31/20 1349  . cefTRIAXone (ROCEPHIN)  IV 1 g (05/30/20 1336)   PRN Meds:.acetaminophen **OR** acetaminophen, hydrALAZINE, ondansetron **OR** ondansetron (ZOFRAN) IV, polyethylene glycol  Micro Results Recent Results (from the past 240 hour(s))  Blood culture (routine x 2)     Status: None (Preliminary result)   Collection Time: 05/29/20  4:15 AM   Specimen: BLOOD  Result Value Ref Range Status   Specimen Description BLOOD LEFT ARM  Final   Special Requests   Final    BOTTLES DRAWN AEROBIC AND ANAEROBIC Blood Culture adequate volume   Culture   Final    NO GROWTH 2 DAYS Performed at [facility], [place] [address] [place], [place] [address]    Report Status PENDING  Incomplete  Blood culture (routine x 2)     Status: None (Preliminary result)   Collection Time: 05/29/20  4:20 AM   Specimen: BLOOD  Result Value Ref Range Status   Specimen Description BLOOD RIGHT ARM  Final   Special Requests   Final    BOTTLES DRAWN AEROBIC AND ANAEROBIC Blood Culture results may not be optimal due to an inadequate volume of blood received in culture bottles   Culture   Final    NO GROWTH 2 DAYS Performed at [facility], [place] [address], [place], [place] [address]    Report  Status PENDING  Incomplete  Urine culture     Status: None   Collection Time: 05/29/20  4:26 AM   Specimen: Urine, Random  Result Value Ref Range Status   Specimen Description URINE, RANDOM  Final   Special Requests NONE  Final   Culture   Final    NO GROWTH Performed at [facility], [place] [address] [place], [place] [address]    Report  Status 05/30/2020 FINAL  Final  SARS Coronavirus 2 by RT PCR (hospital order, performed in [facility] hospital lab) Nasopharyngeal Nasopharyngeal Swab     Status: None   Collection Time: 05/29/20  4:27 AM   Specimen: Nasopharyngeal Swab  Result Value Ref Range Status   SARS Coronavirus 2 NEGATIVE NEGATIVE Final    Comment: (NOTE) SARS-CoV-2 target nucleic acids are NOT DETECTED.  The SARS-CoV-2 RNA is generally detectable in upper and lower respiratory specimens during the acute phase of infection. The lowest concentration of SARS-CoV-2 viral copies this assay can detect is 250 copies / mL. A negative result does not preclude SARS-CoV-2 infection and should not be used as the sole basis for treatment or other patient management decisions.  A negative result may occur with improper specimen collection / handling, submission of specimen other than nasopharyngeal swab, presence of viral mutation(s) within the areas targeted by this assay, and inadequate number of viral copies (<250 copies / mL). A negative result must be combined with clinical observations, patient history, and epidemiological information.  Fact Sheet for Patients:   StrictlyIdeas.no  Fact Sheet for Healthcare Providers: BankingDealers.co.za  This test is not yet approved or  cleared by the [place] FDA and has been authorized for detection and/or diagnosis of SARS-CoV-2 by FDA under an Emergency Use Authorization (EUA).  This EUA will remain in effect (meaning this test can be used) for the duration of the COVID-19  declaration under Section 564(b)(1) of the Act, 21 U.S.C. section 360bbb-3(b)(1), unless the authorization is terminated or revoked sooner.  Performed at [facility], [place] [address], [place],  [address]     Radiology Reports CT Head Wo Contrast  Result Date: 05/29/2020 CLINICAL DATA:  Encephalopathy. EXAM: CT HEAD WITHOUT CONTRAST TECHNIQUE: Contiguous axial images were obtained from the base of the skull through the vertex without intravenous contrast. COMPARISON:  09/17/2019 FINDINGS: Brain: Stable age related cerebral atrophy, ventriculomegaly and periventricular white matter disease. No extra-axial fluid collections are identified. No CT findings for acute hemispheric infarction or intracranial hemorrhage. No mass lesions. The brainstem and cerebellum are normal. Vascular: Stable advanced vascular calcifications but no definite aneurysm or hyperdense vessels. Skull: No skull fracture or bone lesions. Sinuses/Orbits: The paranasal sinuses and mastoid air cells are clear except for some mucoperiosteal thickening in the right maxillary sinus. The globes are intact. Other: No scalp lesions or scalp hematoma. IMPRESSION: 1. Stable age related cerebral atrophy, ventriculomegaly and periventricular white matter disease. 2. No acute intracranial findings or mass lesions. Electronically Signed   By: Marijo Sanes M.D.   On: 05/29/2020 05:16   CT ABDOMEN PELVIS W CONTRAST  Result Date: 05/30/2020 CLINICAL DATA:  86 year old female with nausea vomiting. Chronic abdominal pain. EXAM: CT ABDOMEN AND PELVIS WITH CONTRAST TECHNIQUE: Multidetector CT imaging of the abdomen and pelvis was performed using the standard protocol following bolus administration of intravenous contrast. CONTRAST:  82mL OMNIPAQUE IOHEXOL 300 MG/ML  SOLN COMPARISON:  CT abdomen pelvis dated 05/06/2009. FINDINGS: Lower chest: Partially visualized bibasilar subsegmental atelectasis. There is mild cardiomegaly. Coronary  vascular calcification and and calcification of the mitral annulus. Pacemaker wires noted. No intra-abdominal free air or free fluid. Hepatobiliary: The liver is unremarkable. Mild intrahepatic biliary ductal dilatation, likely post cholecystectomy. No retained calcified stone noted in the central CBD. Pancreas: Unremarkable. No pancreatic ductal dilatation or surrounding inflammatory changes. Spleen: Normal in size without focal abnormality. Adrenals/Urinary Tract: Mild left adrenal thickening. The right adrenal gland is unremarkable. There  is no hydronephrosis on either side. There is a 4.5 cm left renal interpolar calyceal diverticulum containing several stones similar to prior CT of 2010. There is symmetric enhancement and excretion of contrast by both kidneys. The visualized ureters and urinary bladder appear unremarkable. Stomach/Bowel: There is sigmoid diverticulosis without active inflammatory changes. There is no bowel obstruction or active inflammation. The appendix is not visualized with certainty. No inflammatory changes identified in the right lower quadrant. Vascular/Lymphatic: Advanced aortoiliac atherosclerotic disease. The IVC is unremarkable. No portal venous gas. There is no adenopathy. Reproductive: Hysterectomy. No adnexal masses. Other: Mild subcutaneous edema. Musculoskeletal: Osteopenia with degenerative changes of the spine. Age indeterminate L1 compression fracture with approximately 50% loss of vertebral body height. Correlation with clinical exam and point tenderness recommended. IMPRESSION: 1. No acute intra-abdominal or pelvic pathology. 2. Sigmoid diverticulosis. No bowel obstruction. 3. Left renal interpolar calyceal diverticulum containing several stones similar to prior CT of 2010. No hydronephrosis. 4. Age indeterminate L1 compression fracture with approximately 50% loss of vertebral body height. Correlation with clinical exam and point tenderness recommended. 5. Aortic  Atherosclerosis (ICD10-I70.0). Electronically Signed   By: Anner Crete M.D.   On: 05/30/2020 19:17   DG Chest Port 1 View  Result Date: 05/29/2020 CLINICAL DATA:  Altered mental status EXAM: PORTABLE CHEST 1 VIEW COMPARISON:  09/16/2019 FINDINGS: Cardiomegaly and aortic tortuosity. Dual-chamber pacer leads in unremarkable position. Mitral annular calcification. There is no edema, consolidation, effusion, or pneumothorax. Right glenohumeral arthroplasty, reversed. There is osteopenia. Cholecystectomy clips. IMPRESSION: No evidence of acute disease. Electronically Signed   By: Monte Fantasia M.D.   On: 05/29/2020 04:24     Phillips Climes M.D on 05/31/2020 at 2:50 PM    [facility] -  Office  [phone]

## 2020-05-31 NOTE — Progress Notes (Signed)
Pt has not voided all day.  Bladder scan showed 79 mls.  In and out cath performed and obtain 150 mls.  MD notified. Will continue to monitor.

## 2020-06-01 DIAGNOSIS — K219 Gastro-esophageal reflux disease without esophagitis: Secondary | ICD-10-CM

## 2020-06-01 DIAGNOSIS — I160 Hypertensive urgency: Secondary | ICD-10-CM

## 2020-06-01 LAB — CBC
HCT: 41.2 % (ref 36.0–46.0)
Hemoglobin: 13.3 g/dL (ref 12.0–15.0)
MCH: 31.7 pg (ref 26.0–34.0)
MCHC: 32.3 g/dL (ref 30.0–36.0)
MCV: 98.1 fL (ref 80.0–100.0)
Platelets: 203 10*3/uL (ref 150–400)
RBC: 4.2 MIL/uL (ref 3.87–5.11)
RDW: 13.6 % (ref 11.5–15.5)
WBC: 6.2 10*3/uL (ref 4.0–10.5)
nRBC: 0 % (ref 0.0–0.2)

## 2020-06-01 LAB — BASIC METABOLIC PANEL
Anion gap: 10 (ref 5–15)
BUN: 17 mg/dL (ref 8–23)
CO2: 21 mmol/L — ABNORMAL LOW (ref 22–32)
Calcium: 9.2 mg/dL (ref 8.9–10.3)
Chloride: 111 mmol/L (ref 98–111)
Creatinine, Ser: 1.09 mg/dL — ABNORMAL HIGH (ref 0.44–1.00)
GFR calc Af Amer: 51 mL/min — ABNORMAL LOW (ref >60)
GFR calc non Af Amer: 44 mL/min — ABNORMAL LOW (ref >60)
Glucose, Bld: 131 mg/dL — ABNORMAL HIGH (ref 70–99)
Potassium: 3.9 mmol/L (ref 3.5–5.1)
Sodium: 142 mmol/L (ref 135–145)

## 2020-06-01 LAB — LIPASE, BLOOD: Lipase: 76 U/L — ABNORMAL HIGH (ref 11–51)

## 2020-06-01 MED ORDER — METOPROLOL SUCCINATE ER 100 MG PO TB24: 100.0000 mg | ORAL_TABLET | Freq: Every day | ORAL | 1 refills | Status: DC

## 2020-06-01 MED ORDER — ACETAMINOPHEN 325 MG PO TABS: 650.0000 mg | ORAL_TABLET | Freq: Four times a day (QID) | ORAL | Status: DC | PRN

## 2020-06-01 MED ORDER — AMLODIPINE BESYLATE 2.5 MG PO TABS: 2.5000 mg | ORAL_TABLET | Freq: Every evening | ORAL | 0 refills | Status: DC

## 2020-06-01 NOTE — TOC Transition Note (Signed)
Transition of Care [facility]) - CM/SW Discharge Note   Patient Details  Name: Julie Bass MRN: [id number] Date of Birth: 09-Oct-1926  [facility] CM/SW Contact:  Bartholomew Crews, RN Phone Number: [phone] 06/01/2020, 12:35 PM   Clinical Narrative:     Notified by MD of patient's readiness to transition home. HH orders place for PT, OT. Notified Alvis Lemmings of transition home today. No other TOC needs identified at this time.   Final next level of care: [place] Barriers to Discharge: No Barriers Identified   Patient Goals and CMS Choice Patient states their goals for this hospitalization and ongoing recovery are:: return home with home health/have her continue therapies CMS Medicare.gov Compare Post Acute Care list provided to:: Patient Represenative (must comment) (pt daughter [name]) Choice offered to / list presented to : Adult Children  Discharge Placement                       Discharge Plan and Services In-house Referral: Clinical Social Work Discharge Planning Services: CM Consult, Follow-up appt scheduled Post Acute Care Choice: Home Health          DME Arranged: N/A DME Agency: NA       HH Arranged: PT, OT HH Agency: [place] Date [facility] Agency Contacted: 06/01/20 Time [place]: [phone] Representative spoke with at [place]: [place] Determinants of Health ([place]) Interventions     Readmission Risk Interventions No flowsheet data found.

## 2020-06-01 NOTE — Discharge Summary (Signed)
Julie Bass, is a 86 y.o. female  DOB 02/26/1926  MRN [id number].  Admission date:  05/28/2020  Admitting Physician  Vernelle Emerald, MD  Discharge Date:  06/01/2020   Primary MD  Denita Lung, MD  Recommendations for primary care physician for things to follow:  -Please check CBC, CMP during next visit. -Patient with labile blood pressure, adjust medications as needed.  Admission Diagnosis  Altered mental status, unspecified altered mental status type [Z85.88] Acute metabolic encephalopathy [F02.77]   Discharge Diagnosis  Altered mental status, unspecified altered mental status type [A12.87] Acute metabolic encephalopathy [O67.67]    Principal Problem:   Acute metabolic encephalopathy Active Problems:   GERD without esophagitis   Acute renal failure superimposed on stage 3a chronic kidney disease (HCC)   Lactic acidosis   Hypertensive urgency      Past Medical History:  Diagnosis Date  . Arthritis   . Chronic kidney disease    KIDNEY STONES  . Dementia ([place])    BEGINNING STAGES   . Depression   . GERD (gastroesophageal reflux disease)   . Glaucoma   . Hiatal hernia   . HTN (hypertension) 10/18/2011  . Hyperlipemia 10/18/2011  . NSVT (nonsustained ventricular tachycardia) ([place]) 10/18/2011  . Osteoporosis   . PAD (peripheral artery disease) ([place]) 10/18/2011   h/o left SFA stent    Past Surgical History:  Procedure Laterality Date  . ABDOMINAL HYSTERECTOMY  1973  . CARDIAC CATHETERIZATION     2012  . CHOLECYSTECTOMY    . LOOP RECORDER IMPLANT N/A 08/27/2014   Procedure: LOOP RECORDER IMPLANT;  Surgeon: Coralyn Mark, MD;  Location: [facility];  Service: Cardiovascular;  Laterality: N/A;  . LOOP RECORDER REMOVAL N/A 01/09/2018   Procedure: LOOP RECORDER REMOVAL;  Surgeon: Evans Lance, MD;  Location: [facility];  Service: Cardiovascular;  Laterality: N/A;  .  LOWER EXTREMITY ANGIOGRAM  11/02/2007   stent of mid left SFA with 6x145mm EV3 self-expanding stent and 6x3 in prox region (Dr. Adora Fridge)  . LOWER EXTREMITY ANGIOGRAM    . LOWER EXTREMITY ARTERIAL DOPPLER  2013   right SFA with 50-69% diameter reduction, right PTA/peroneal occluded, L SFA prox to stent has narrowing with increased velocities >60% diameter reduction, L SFA stent patent, L ATA w/occlusive disease, bilat ABIs show mild arterial insuffiency at rest  . NM MYOCAR PERF WALL MOTION  10/2011   non-gated - normal stuy, EF 82%, normal LV wall motion  . PACEMAKER IMPLANT N/A 01/09/2018   Procedure: PACEMAKER IMPLANT;  Surgeon: Evans Lance, MD;  Location: [facility];  Service: Cardiovascular;  Laterality: N/A;  . REVERSE SHOULDER ARTHROPLASTY Right 01/26/2013   Procedure: RIGHT REVERSE TOTAL SHOULDER ARTHROPLASTY;  Surgeon: Augustin Schooling, MD;  Location: [facility];  Service: Orthopedics;  Laterality: Right;  . TRANSTHORACIC ECHOCARDIOGRAM  10/2012   EF 75-70%, mod conc hypertrophy, severely calcified MV annulus, LA mildly dailted, PA peak pressure 59mmHg       History of  present illness and  Hospital Course:     Kindly see H&P for history of present illness and admission details, please review complete Labs, Consult reports and Test reports for all details in brief  HPI  from the history and physical done on the day of admission 05/29/2020  86 year old female with past medical history of dementia, COVID-19 infection (01/2020), hypertension, chronic kidney disease stage III, hyperlipidemia, gastroesophageal reflux disease who presents to [facility] emergency department with family due to concerns for acute confusion.  Patient is an extremely poor historian due to acute confusion.  The majority the history has been obtained from the daughter via phone conversation.  Daughter explains that approximately 2 days ago the patient began to exhibit confusion.  Patient "was not  making any sense when talking."  Patient also had a substantially worsened appetite.  Upon further questioning the daughter denies any slurring of speech facial droop or focal weakness in the patient.  Patient has not had any sick contacts or fevers as of late.    Upon further questioning it turns out that the patient has been complaining of vague generalized abdominal pain for approximately 1 week prior to the onset of her confusion.  Patient has not exhibited any diarrhea nausea or vomiting.  The daughter, concern for a recurrent urinary tract infection this patient started the patient on an old supply of Bactrim despite the patient not having been seen by a medical provider or having an appropriate work-up.  Due to lack of improvement in symptoms after 3 doses of Bactrim the daughter brought the patient into [facility] emergency department for evaluation.  Upon evaluation in the emergency department the patient is visibly confused and not consistently following commands.  Patient is also been found to be in hypertensive urgency.  Patient has been found to have a substantial lactic acidosis of 4.1.  Due to concerns for acute encephalopathy and possible underlying infectious process the hospitalist group has been called to assess the patient for admission the hospital.   Hospital Course    Acute metabolic encephalopathy - Patient exhibiting 2 days of acute confusion according to family. this is most likely due to hypertensive encephalopathy - Physical examination is nonfocal with CT imaging of the head not revealing any acute disease. -No clear etiology, no evidence of infectious process, no significant electrolyte abnormalities, . -Possibly related to hypertensive encephalopathy, as mentation has improved today once her blood pressure has improved . -Normal B12 and TSH level. -Treated with empiric antibiotic coverage given lactic acidosis on presentation, please see discussion  below under nausea and abdominal pain.  Nausea/abdominal pain -Patient does report nausea, abdominal pain presentation, this appears to be resolved today, but overall very poor oral intake, so CT abdomen pelvis was obtained, with no acute findings . -Patient with very poor appetite, but she has been tolerating her Ensure, I have discussed with the daughter, she has poor appetite at baseline, so I will start on Remeron, will add prostat to her Ensure . -Lipase level mildly elevated in the 70s range, but there is no evidence of acute pancreatitis on imaging, LFTs within normal limits, no abdominal pain , she is tolerating oral intake well.  Acute kidney injury superimposed on chronic kidney disease stage III likely secondary to poor oral intake as of late due to acute confusion and abdominal pain -Overall patient with poor oral intake and poor appetite, but she is able to drink enough fluids, and she is drinking 3 ensures  at least per day, I have tried her on Remeron, but it did cause her excessive sleepiness during daytime, so it has been discontinued upon discharge.  Lactic acidosis -Has resolved with hydration, no evidence of infectious process  Hypertensive urgency - Patient presenting with substantial hypertension with systolic blood pressures consistently above 200  - Hypertensive urgency/emergency could be contributing to patient's confusion in the form of hypertensive encephalopathy -Currently appears to be controlled on current regimen of metoprolol every morning, and low-dose amlodipine QPM.  GERD without esophagitis  Continue home regimen of PPI    Discharge Condition:  stable   Follow UP   Follow-up Information    Denita Lung, MD. Go on 06/10/2020.   Specialty: Family Medicine Why: 7/27 at 10:30am. Please call to reschedule as needed. Contact information: [address] [place] [address] [phone]        [facility], [facility] Follow  up.   Specialty: Home Health Services Why: They will call to schedule first vist, you have been arranged to have home health physical and occupational therapy.  Contact information: [employer] [place] [address] [phone]                 Discharge Instructions  and  Discharge Medications     Discharge Instructions    Discharge instructions   Complete by: As directed    Follow with Primary MD Denita Lung, MD in 7 days   Get CBC, CMP,checked  by Primary MD next visit.    Activity: As tolerated with Full fall precautions use walker/cane & assistance as needed   Disposition Home   Diet:  Regular diet   On your next visit with your primary care physician please Get Medicines reviewed and adjusted.   Please request your Prim.MD to go over all Hospital Tests and Procedure/Radiological results at the follow up, please get all Hospital records sent to your Prim MD by signing hospital release before you go home.   If you experience worsening of your admission symptoms, develop shortness of breath, life threatening emergency, suicidal or homicidal thoughts you must seek medical attention immediately by calling 911 or calling your MD immediately  if symptoms less severe.  You Must read complete instructions/literature along with all the possible adverse reactions/side effects for all the Medicines you take and that have been prescribed to you. Take any new Medicines after you have completely understood and accpet all the possible adverse reactions/side effects.   Do not drive, operating heavy machinery, perform activities at heights, swimming or participation in water activities or provide baby sitting services if your were admitted for syncope or siezures until you have seen by Primary MD or a Neurologist and advised to do so again.  Do not drive when taking Pain medications.    Do not take more than prescribed Pain, Sleep and Anxiety  Medications  Special Instructions: If you have smoked or chewed Tobacco  in the last 2 yrs please stop smoking, stop any regular Alcohol  and or any Recreational drug use.  Wear Seat belts while driving.   Please note  You were cared for by a hospitalist during your hospital stay. If you have any questions about your discharge medications or the care you received while you were in the hospital after you are discharged, you can call the unit and asked to speak with the hospitalist on call if the hospitalist that took care of you is not available. Once you are  discharged, your primary care physician will handle any further medical issues. Please note that NO REFILLS for any discharge medications will be authorized once you are discharged, as it is imperative that you return to your primary care physician (or establish a relationship with a primary care physician if you do not have one) for your aftercare needs so that they can reassess your need for medications and monitor your lab values.   Increase activity slowly   Complete by: As directed      Allergies as of 06/01/2020      Reactions   Atorvastatin Calcium    Other reaction(s): Myalgias (intolerance) Muscle pain   Crestor [rosuvastatin Calcium]    Muscle pain   Lipitor [atorvastatin Calcium]    Muscle pain   Rosuvastatin Calcium    Other reaction(s): Myalgias (intolerance) Muscle pain      Medication List    STOP taking these medications   losartan 100 MG tablet Commonly known as: COZAAR   sulfamethoxazole-trimethoprim 800-160 MG tablet Commonly known as: BACTRIM DS     TAKE these medications   acetaminophen 325 MG tablet Commonly known as: TYLENOL Take 2 tablets (650 mg total) by mouth every 6 (six) hours as needed for mild pain (or Fever >/= 101).   amLODipine 2.5 MG tablet Commonly known as: NORVASC Take 1 tablet (2.5 mg total) by mouth every evening. What changed:   when to take this  Another medication with  the same name was removed. Continue taking this medication, and follow the directions you see here.   cilostazol 50 MG tablet Commonly known as: PLETAL TAKE 1 TABLET BY MOUTH TWICE A DAY   citalopram 10 MG tablet Commonly known as: CELEXA TAKE 1 TABLET BY MOUTH EVERY DAY   CRANBERRY PO Take 1 capsule by mouth daily.   ezetimibe 10 MG tablet Commonly known as: ZETIA TAKE 1 TABLET BY MOUTH EVERY DAY   feeding supplement (ENSURE COMPLETE) Liqd Take 237 mLs by mouth 3 (three) times daily between meals. What changed:   when to take this  additional instructions   geriatric multivitamins-minerals Liqd Take 15 mLs by mouth daily.   Livalo 2 MG Tabs Generic drug: Pitavastatin Calcium TAKE 1 TABLET BY MOUTH EVERY DAY What changed: how much to take   metoprolol succinate 100 MG 24 hr tablet Commonly known as: TOPROL-XL Take 1 tablet (100 mg total) by mouth daily. Please take in morning after breakfast What changed: additional instructions   omega-3 acid ethyl esters 1 g capsule Commonly known as: LOVAZA TAKE 1 CAPSULE (1 G TOTAL) BY MOUTH EVERY EVENING.   pantoprazole 40 MG tablet Commonly known as: PROTONIX Take 1 tablet (40 mg total) by mouth daily. What changed:   when to take this  reasons to take this   timolol 0.5 % ophthalmic solution Commonly known as: TIMOPTIC Place 1 drop into both eyes 2 (two) times daily.         Diet and Activity recommendation: See Discharge Instructions above   Consults obtained -  None   Major procedures and Radiology Reports - PLEASE review detailed and final reports for all details, in brief -      CT Head Wo Contrast  Result Date: 05/29/2020 CLINICAL DATA:  Encephalopathy. EXAM: CT HEAD WITHOUT CONTRAST TECHNIQUE: Contiguous axial images were obtained from the base of the skull through the vertex without intravenous contrast. COMPARISON:  09/17/2019 FINDINGS: Brain: Stable age related cerebral atrophy,  ventriculomegaly and periventricular white matter disease. No  extra-axial fluid collections are identified. No CT findings for acute hemispheric infarction or intracranial hemorrhage. No mass lesions. The brainstem and cerebellum are normal. Vascular: Stable advanced vascular calcifications but no definite aneurysm or hyperdense vessels. Skull: No skull fracture or bone lesions. Sinuses/Orbits: The paranasal sinuses and mastoid air cells are clear except for some mucoperiosteal thickening in the right maxillary sinus. The globes are intact. Other: No scalp lesions or scalp hematoma. IMPRESSION: 1. Stable age related cerebral atrophy, ventriculomegaly and periventricular white matter disease. 2. No acute intracranial findings or mass lesions. Electronically Signed   By: Marijo Sanes M.D.   On: 05/29/2020 05:16   CT ABDOMEN PELVIS W CONTRAST  Result Date: 05/30/2020 CLINICAL DATA:  86 year old female with nausea vomiting. Chronic abdominal pain. EXAM: CT ABDOMEN AND PELVIS WITH CONTRAST TECHNIQUE: Multidetector CT imaging of the abdomen and pelvis was performed using the standard protocol following bolus administration of intravenous contrast. CONTRAST:  48mL OMNIPAQUE IOHEXOL 300 MG/ML  SOLN COMPARISON:  CT abdomen pelvis dated 05/06/2009. FINDINGS: Lower chest: Partially visualized bibasilar subsegmental atelectasis. There is mild cardiomegaly. Coronary vascular calcification and and calcification of the mitral annulus. Pacemaker wires noted. No intra-abdominal free air or free fluid. Hepatobiliary: The liver is unremarkable. Mild intrahepatic biliary ductal dilatation, likely post cholecystectomy. No retained calcified stone noted in the central CBD. Pancreas: Unremarkable. No pancreatic ductal dilatation or surrounding inflammatory changes. Spleen: Normal in size without focal abnormality. Adrenals/Urinary Tract: Mild left adrenal thickening. The right adrenal gland is unremarkable. There is no hydronephrosis  on either side. There is a 4.5 cm left renal interpolar calyceal diverticulum containing several stones similar to prior CT of 2010. There is symmetric enhancement and excretion of contrast by both kidneys. The visualized ureters and urinary bladder appear unremarkable. Stomach/Bowel: There is sigmoid diverticulosis without active inflammatory changes. There is no bowel obstruction or active inflammation. The appendix is not visualized with certainty. No inflammatory changes identified in the right lower quadrant. Vascular/Lymphatic: Advanced aortoiliac atherosclerotic disease. The IVC is unremarkable. No portal venous gas. There is no adenopathy. Reproductive: Hysterectomy. No adnexal masses. Other: Mild subcutaneous edema. Musculoskeletal: Osteopenia with degenerative changes of the spine. Age indeterminate L1 compression fracture with approximately 50% loss of vertebral body height. Correlation with clinical exam and point tenderness recommended. IMPRESSION: 1. No acute intra-abdominal or pelvic pathology. 2. Sigmoid diverticulosis. No bowel obstruction. 3. Left renal interpolar calyceal diverticulum containing several stones similar to prior CT of 2010. No hydronephrosis. 4. Age indeterminate L1 compression fracture with approximately 50% loss of vertebral body height. Correlation with clinical exam and point tenderness recommended. 5. Aortic Atherosclerosis (ICD10-I70.0). Electronically Signed   By: Anner Crete M.D.   On: 05/30/2020 19:17   DG Chest Port 1 View  Result Date: 05/29/2020 CLINICAL DATA:  Altered mental status EXAM: PORTABLE CHEST 1 VIEW COMPARISON:  09/16/2019 FINDINGS: Cardiomegaly and aortic tortuosity. Dual-chamber pacer leads in unremarkable position. Mitral annular calcification. There is no edema, consolidation, effusion, or pneumothorax. Right glenohumeral arthroplasty, reversed. There is osteopenia. Cholecystectomy clips. IMPRESSION: No evidence of acute disease. Electronically  Signed   By: Monte Fantasia M.D.   On: 05/29/2020 04:24    Micro Results     Recent Results (from the past 240 hour(s))  Blood culture (routine x 2)     Status: None (Preliminary result)   Collection Time: 05/29/20  4:15 AM   Specimen: BLOOD  Result Value Ref Range Status   Specimen Description BLOOD LEFT ARM  Final   Special  Requests   Final    BOTTLES DRAWN AEROBIC AND ANAEROBIC Blood Culture adequate volume   Culture   Final    NO GROWTH 2 DAYS Performed at [facility], [place] [address], [place], [place] [address]    Report Status PENDING  Incomplete  Blood culture (routine x 2)     Status: None (Preliminary result)   Collection Time: 05/29/20  4:20 AM   Specimen: BLOOD  Result Value Ref Range Status   Specimen Description BLOOD RIGHT ARM  Final   Special Requests   Final    BOTTLES DRAWN AEROBIC AND ANAEROBIC Blood Culture results may not be optimal due to an inadequate volume of blood received in culture bottles   Culture   Final    NO GROWTH 2 DAYS Performed at [facility], [place] [address], [place], [place] [address]    Report Status PENDING  Incomplete  Urine culture     Status: None   Collection Time: 05/29/20  4:26 AM   Specimen: Urine, Random  Result Value Ref Range Status   Specimen Description URINE, RANDOM  Final   Special Requests NONE  Final   Culture   Final    NO GROWTH Performed at [facility], [place] [address], [place], [place] [address]    Report Status 05/30/2020 FINAL  Final  SARS Coronavirus 2 by RT PCR (hospital order, performed in [facility] hospital lab) Nasopharyngeal Nasopharyngeal Swab     Status: None   Collection Time: 05/29/20  4:27 AM   Specimen: Nasopharyngeal Swab  Result Value Ref Range Status   SARS Coronavirus 2 NEGATIVE NEGATIVE Final    Comment: (NOTE) SARS-CoV-2 target nucleic acids are NOT DETECTED.  The SARS-CoV-2 RNA is generally detectable in upper and lower respiratory specimens during the acute phase  of infection. The lowest concentration of SARS-CoV-2 viral copies this assay can detect is 250 copies / mL. A negative result does not preclude SARS-CoV-2 infection and should not be used as the sole basis for treatment or other patient management decisions.  A negative result may occur with improper specimen collection / handling, submission of specimen other than nasopharyngeal swab, presence of viral mutation(s) within the areas targeted by this assay, and inadequate number of viral copies (<250 copies / mL). A negative result must be combined with clinical observations, patient history, and epidemiological information.  Fact Sheet for Patients:   StrictlyIdeas.no  Fact Sheet for Healthcare Providers: BankingDealers.co.za  This test is not yet approved or  cleared by the [place] FDA and has been authorized for detection and/or diagnosis of SARS-CoV-2 by FDA under an Emergency Use Authorization (EUA).  This EUA will remain in effect (meaning this test can be used) for the duration of the COVID-19 declaration under Section 564(b)(1) of the Act, 21 U.S.C. section 360bbb-3(b)(1), unless the authorization is terminated or revoked sooner.  Performed at [facility], [place] [address], [place],  [address]        Today   Subjective:   Julie Bass today has no headache,no chest or abdominal pain, no urinary retention overnight .   Objective:   Blood pressure (!) 136/57, pulse 80, temperature 98.2 F (36.8 C), resp. rate 17, height 5\' 4"  (1.626 m), weight 52 kg, SpO2 96 %.   Intake/Output Summary (Last 24 hours) at 06/01/2020 1229 Last data filed at 06/01/2020 1014 Gross per 24 hour  Intake 2236.82 ml  Output 600 ml  Net 1636.82  ml    Exam Awake Alert, pleasant, answering questions appropriately, frail. Symmetrical Chest wall movement, Good air movement bilaterally, CTAB RRR,No Gallops,Rubs or new  Murmurs, No Parasternal Heave +ve B.Sounds, Abd Soft, Non tender, No rebound -guarding or rigidity. No Cyanosis, Clubbing or edema, No new Rash or bruise  Data Review   CBC w Diff:  Lab Results  Component Value Date   WBC 6.2 06/01/2020   HGB 13.3 06/01/2020   HGB 15.3 08/08/2019   HCT 41.2 06/01/2020   HCT 47.2 (H) 08/08/2019   PLT 203 06/01/2020   PLT 177 08/08/2019   LYMPHOPCT 25 05/30/2020   MONOPCT 9 05/30/2020   EOSPCT 1 05/30/2020   BASOPCT 0 05/30/2020    CMP:  Lab Results  Component Value Date   NA 142 06/01/2020   NA 140 08/08/2019   K 3.9 06/01/2020   CL 111 06/01/2020   CO2 21 (L) 06/01/2020   BUN 17 06/01/2020   BUN 13 08/08/2019   CREATININE 1.09 (H) 06/01/2020   CREATININE 1.24 (H) 04/05/2017   PROT 5.9 (L) 05/31/2020   PROT 7.1 08/08/2019   ALBUMIN 3.3 (L) 05/31/2020   ALBUMIN 4.6 08/08/2019   BILITOT 1.1 05/31/2020   BILITOT 1.2 08/08/2019   ALKPHOS 32 (L) 05/31/2020   AST 29 05/31/2020   ALT 20 05/31/2020  .   Total Time in preparing paper work, data evaluation and todays exam - 39 minutes  Phillips Climes M.D on 06/01/2020 at 12:29 PM  [facility]   Office  [phone]

## 2020-06-01 NOTE — Discharge Instructions (Signed)
Follow with Primary MD Denita Lung, MD in 7 days   Get CBC, CMP,checked  by Primary MD next visit.    Activity: As tolerated with Full fall precautions use walker/cane & assistance as needed   Disposition Home   Diet:  Regular diet   On your next visit with your primary care physician please Get Medicines reviewed and adjusted.   Please request your Prim.MD to go over all Hospital Tests and Procedure/Radiological results at the follow up, please get all Hospital records sent to your Prim MD by signing hospital release before you go home.   If you experience worsening of your admission symptoms, develop shortness of breath, life threatening emergency, suicidal or homicidal thoughts you must seek medical attention immediately by calling 911 or calling your MD immediately  if symptoms less severe.  You Must read complete instructions/literature along with all the possible adverse reactions/side effects for all the Medicines you take and that have been prescribed to you. Take any new Medicines after you have completely understood and accpet all the possible adverse reactions/side effects.   Do not drive, operating heavy machinery, perform activities at heights, swimming or participation in water activities or provide baby sitting services if your were admitted for syncope or siezures until you have seen by Primary MD or a Neurologist and advised to do so again.  Do not drive when taking Pain medications.    Do not take more than prescribed Pain, Sleep and Anxiety Medications  Special Instructions: If you have smoked or chewed Tobacco  in the last 2 yrs please stop smoking, stop any regular Alcohol  and or any Recreational drug use.  Wear Seat belts while driving.   Please note  You were cared for by a hospitalist during your hospital stay. If you have any questions about your discharge medications or the care you received while you were in the hospital after you are discharged,  you can call the unit and asked to speak with the hospitalist on call if the hospitalist that took care of you is not available. Once you are discharged, your primary care physician will handle any further medical issues. Please note that NO REFILLS for any discharge medications will be authorized once you are discharged, as it is imperative that you return to your primary care physician (or establish a relationship with a primary care physician if you do not have one) for your aftercare needs so that they can reassess your need for medications and monitor your lab values.

## 2020-06-03 LAB — CULTURE, BLOOD (ROUTINE X 2)
Culture: NO GROWTH
Culture: NO GROWTH
Special Requests: ADEQUATE

## 2020-06-06 DIAGNOSIS — G9341 Metabolic encephalopathy: Secondary | ICD-10-CM | POA: Diagnosis not present

## 2020-06-06 DIAGNOSIS — N1831 Chronic kidney disease, stage 3a: Secondary | ICD-10-CM | POA: Diagnosis not present

## 2020-06-06 DIAGNOSIS — I1310 Hypertensive heart and chronic kidney disease without heart failure, with stage 1 through stage 4 chronic kidney disease, or unspecified chronic kidney disease: Secondary | ICD-10-CM | POA: Diagnosis not present

## 2020-06-06 DIAGNOSIS — I2510 Atherosclerotic heart disease of native coronary artery without angina pectoris: Secondary | ICD-10-CM | POA: Diagnosis not present

## 2020-06-09 ENCOUNTER — Telehealth: Payer: Self-pay

## 2020-06-09 DIAGNOSIS — I1310 Hypertensive heart and chronic kidney disease without heart failure, with stage 1 through stage 4 chronic kidney disease, or unspecified chronic kidney disease: Secondary | ICD-10-CM | POA: Diagnosis not present

## 2020-06-09 DIAGNOSIS — N1831 Chronic kidney disease, stage 3a: Secondary | ICD-10-CM | POA: Diagnosis not present

## 2020-06-09 DIAGNOSIS — G9341 Metabolic encephalopathy: Secondary | ICD-10-CM | POA: Diagnosis not present

## 2020-06-09 DIAGNOSIS — I2510 Atherosclerotic heart disease of native coronary artery without angina pectoris: Secondary | ICD-10-CM | POA: Diagnosis not present

## 2020-06-09 NOTE — Telephone Encounter (Signed)
Arnold from home health called and requested verbal for pt to have P/T 1 for 1 than 2 for 3 and and finally 1 for one. Please advise if ok. [place]

## 2020-06-10 ENCOUNTER — Telehealth: Payer: Self-pay

## 2020-06-10 ENCOUNTER — Inpatient Hospital Stay: Payer: Medicare Other | Admitting: Family Medicine

## 2020-06-10 DIAGNOSIS — I1310 Hypertensive heart and chronic kidney disease without heart failure, with stage 1 through stage 4 chronic kidney disease, or unspecified chronic kidney disease: Secondary | ICD-10-CM | POA: Diagnosis not present

## 2020-06-10 DIAGNOSIS — I2510 Atherosclerotic heart disease of native coronary artery without angina pectoris: Secondary | ICD-10-CM | POA: Diagnosis not present

## 2020-06-10 DIAGNOSIS — G9341 Metabolic encephalopathy: Secondary | ICD-10-CM | POA: Diagnosis not present

## 2020-06-10 DIAGNOSIS — N1831 Chronic kidney disease, stage 3a: Secondary | ICD-10-CM | POA: Diagnosis not present

## 2020-06-10 NOTE — Telephone Encounter (Signed)
ok 

## 2020-06-10 NOTE — Telephone Encounter (Signed)
They were advised [facility]

## 2020-06-10 NOTE — Telephone Encounter (Signed)
Elmo Putt form [facility] called wanting to know if he could get verbal orders for a social worker for the pt. If someone could call him back at the number provided to give the orders. [phone].

## 2020-06-10 NOTE — Telephone Encounter (Signed)
LVM advising the ok. [place]

## 2020-06-12 ENCOUNTER — Telehealth: Payer: Self-pay | Admitting: Family Medicine

## 2020-06-12 ENCOUNTER — Encounter: Payer: Self-pay | Admitting: Family Medicine

## 2020-06-12 ENCOUNTER — Other Ambulatory Visit: Payer: Self-pay

## 2020-06-12 ENCOUNTER — Ambulatory Visit (INDEPENDENT_AMBULATORY_CARE_PROVIDER_SITE_OTHER): Payer: Medicare Other | Admitting: Family Medicine

## 2020-06-12 VITALS — BP 148/82 | HR 74 | Temp 98.1°F | Wt 110.6 lb

## 2020-06-12 DIAGNOSIS — R4182 Altered mental status, unspecified: Secondary | ICD-10-CM | POA: Diagnosis not present

## 2020-06-12 DIAGNOSIS — G9341 Metabolic encephalopathy: Secondary | ICD-10-CM

## 2020-06-12 LAB — POCT URINALYSIS DIP (PROADVANTAGE DEVICE)
Bilirubin, UA: NEGATIVE
Blood, UA: NEGATIVE
Glucose, UA: NEGATIVE mg/dL
Ketones, POC UA: NEGATIVE mg/dL
Leukocytes, UA: NEGATIVE
Nitrite, UA: NEGATIVE
Protein Ur, POC: NEGATIVE mg/dL
Specific Gravity, Urine: 1.015
Urobilinogen, Ur: 0.2
pH, UA: 6.5 (ref 5.0–8.0)

## 2020-06-12 NOTE — Telephone Encounter (Signed)
Debbie from [facility] called and is requesting verbal orders for a social worker visit for 06/17/2020 she can be reached at [phone] states the pt care giver is requesting this visit

## 2020-06-12 NOTE — Telephone Encounter (Signed)
Julie Bass was  advised. [place]

## 2020-06-12 NOTE — Progress Notes (Signed)
   Subjective:    Patient ID: Julie Bass, female    DOB: 08/24/1926, 86 y.o.   MRN: [id number]  HPI She is here for a follow-up visit after recent hospitalization for treatment of metabolic encephalopathy with elevated lactic acid and evidence of dehydration.  The hospital record including the emergency room and discharge summary was reviewed.  Presently she is essentially back to her normal health with eating habits returning, physical activity and mental status at her normal state.  She is cared for by family members 24/7 and they state that she is now back to her normal state of health.   Review of Systems     Objective:   Physical Exam Alert and in no distress.  Cardiac exam does show a systolic murmur with loud S2.  Lungs are clear to auscultation.  Abdominal exam shows no masses or tenderness.       Assessment & Plan:  Altered mental status, unspecified altered mental status type - Plan: CBC with Differential/Platelet, Comprehensive metabolic panel, Lactic acid, plasma  Metabolic encephalopathy - Plan: CBC with Differential/Platelet, Comprehensive metabolic panel, Lactic acid, plasma We will try to get a urine on her as well.  Discussed the care that she is getting at home.  They seem to have a good approach towards taking care of her in terms of dementia.  She seems a very comfortable and at present living situation.  30 minutes spent in reviewing her medical record, care and examination.

## 2020-06-12 NOTE — Telephone Encounter (Signed)
ok 

## 2020-06-13 DIAGNOSIS — I1310 Hypertensive heart and chronic kidney disease without heart failure, with stage 1 through stage 4 chronic kidney disease, or unspecified chronic kidney disease: Secondary | ICD-10-CM | POA: Diagnosis not present

## 2020-06-13 DIAGNOSIS — N1831 Chronic kidney disease, stage 3a: Secondary | ICD-10-CM | POA: Diagnosis not present

## 2020-06-13 DIAGNOSIS — G9341 Metabolic encephalopathy: Secondary | ICD-10-CM | POA: Diagnosis not present

## 2020-06-13 DIAGNOSIS — I2510 Atherosclerotic heart disease of native coronary artery without angina pectoris: Secondary | ICD-10-CM | POA: Diagnosis not present

## 2020-06-16 DIAGNOSIS — I1310 Hypertensive heart and chronic kidney disease without heart failure, with stage 1 through stage 4 chronic kidney disease, or unspecified chronic kidney disease: Secondary | ICD-10-CM | POA: Diagnosis not present

## 2020-06-16 DIAGNOSIS — G9341 Metabolic encephalopathy: Secondary | ICD-10-CM | POA: Diagnosis not present

## 2020-06-16 DIAGNOSIS — N1831 Chronic kidney disease, stage 3a: Secondary | ICD-10-CM | POA: Diagnosis not present

## 2020-06-16 DIAGNOSIS — I2510 Atherosclerotic heart disease of native coronary artery without angina pectoris: Secondary | ICD-10-CM | POA: Diagnosis not present

## 2020-06-16 LAB — COMPREHENSIVE METABOLIC PANEL
ALT: 22 IU/L (ref 0–32)
AST: 27 IU/L (ref 0–40)
Albumin/Globulin Ratio: 2.3 — ABNORMAL HIGH (ref 1.2–2.2)
Albumin: 4.5 g/dL (ref 3.5–4.6)
Alkaline Phosphatase: 40 IU/L — ABNORMAL LOW (ref 48–121)
BUN/Creatinine Ratio: 15 (ref 12–28)
BUN: 17 mg/dL (ref 10–36)
Bilirubin Total: 0.6 mg/dL (ref 0.0–1.2)
CO2: 23 mmol/L (ref 20–29)
Calcium: 10.1 mg/dL (ref 8.7–10.3)
Chloride: 105 mmol/L (ref 96–106)
Creatinine, Ser: 1.16 mg/dL — ABNORMAL HIGH (ref 0.57–1.00)
GFR calc Af Amer: 47 mL/min/{1.73_m2} — ABNORMAL LOW (ref >59)
GFR calc non Af Amer: 41 mL/min/{1.73_m2} — ABNORMAL LOW (ref >59)
Globulin, Total: 2 g/dL (ref 1.5–4.5)
Glucose: 106 mg/dL — ABNORMAL HIGH (ref 65–99)
Potassium: 4.8 mmol/L (ref 3.5–5.2)
Sodium: 139 mmol/L (ref 134–144)
Total Protein: 6.5 g/dL (ref 6.0–8.5)

## 2020-06-16 LAB — CBC WITH DIFFERENTIAL/PLATELET
Basophils Absolute: 0 10*3/uL (ref 0.0–0.2)
Basos: 1 %
EOS (ABSOLUTE): 0.1 10*3/uL (ref 0.0–0.4)
Eos: 2 %
Hematocrit: 41.5 % (ref 34.0–46.6)
Hemoglobin: 13 g/dL (ref 11.1–15.9)
Immature Grans (Abs): 0 10*3/uL (ref 0.0–0.1)
Immature Granulocytes: 1 %
Lymphocytes Absolute: 1.8 10*3/uL (ref 0.7–3.1)
Lymphs: 33 %
MCH: 31.4 pg (ref 26.6–33.0)
MCHC: 31.3 g/dL — ABNORMAL LOW (ref 31.5–35.7)
MCV: 100 fL — ABNORMAL HIGH (ref 79–97)
Monocytes Absolute: 0.6 10*3/uL (ref 0.1–0.9)
Monocytes: 10 %
Neutrophils Absolute: 2.9 10*3/uL (ref 1.4–7.0)
Neutrophils: 53 %
Platelets: 219 10*3/uL (ref 150–450)
RBC: 4.14 x10E6/uL (ref 3.77–5.28)
RDW: 13.2 % (ref 11.7–15.4)
WBC: 5.4 10*3/uL (ref 3.4–10.8)

## 2020-06-17 ENCOUNTER — Other Ambulatory Visit: Payer: Self-pay

## 2020-06-17 DIAGNOSIS — N1831 Chronic kidney disease, stage 3a: Secondary | ICD-10-CM | POA: Diagnosis not present

## 2020-06-17 DIAGNOSIS — I1310 Hypertensive heart and chronic kidney disease without heart failure, with stage 1 through stage 4 chronic kidney disease, or unspecified chronic kidney disease: Secondary | ICD-10-CM | POA: Diagnosis not present

## 2020-06-17 DIAGNOSIS — I2510 Atherosclerotic heart disease of native coronary artery without angina pectoris: Secondary | ICD-10-CM | POA: Diagnosis not present

## 2020-06-17 DIAGNOSIS — G9341 Metabolic encephalopathy: Secondary | ICD-10-CM | POA: Diagnosis not present

## 2020-06-17 MED ORDER — AMLODIPINE BESYLATE 2.5 MG PO TABS: 2.5000 mg | ORAL_TABLET | Freq: Every evening | ORAL | 0 refills | Status: DC

## 2020-06-20 DIAGNOSIS — I1310 Hypertensive heart and chronic kidney disease without heart failure, with stage 1 through stage 4 chronic kidney disease, or unspecified chronic kidney disease: Secondary | ICD-10-CM | POA: Diagnosis not present

## 2020-06-20 DIAGNOSIS — N1831 Chronic kidney disease, stage 3a: Secondary | ICD-10-CM | POA: Diagnosis not present

## 2020-06-20 DIAGNOSIS — G9341 Metabolic encephalopathy: Secondary | ICD-10-CM | POA: Diagnosis not present

## 2020-06-20 DIAGNOSIS — I2510 Atherosclerotic heart disease of native coronary artery without angina pectoris: Secondary | ICD-10-CM | POA: Diagnosis not present

## 2020-06-23 DIAGNOSIS — N1831 Chronic kidney disease, stage 3a: Secondary | ICD-10-CM | POA: Diagnosis not present

## 2020-06-23 DIAGNOSIS — I1310 Hypertensive heart and chronic kidney disease without heart failure, with stage 1 through stage 4 chronic kidney disease, or unspecified chronic kidney disease: Secondary | ICD-10-CM | POA: Diagnosis not present

## 2020-06-23 DIAGNOSIS — G9341 Metabolic encephalopathy: Secondary | ICD-10-CM | POA: Diagnosis not present

## 2020-06-23 DIAGNOSIS — I2510 Atherosclerotic heart disease of native coronary artery without angina pectoris: Secondary | ICD-10-CM | POA: Diagnosis not present

## 2020-06-24 DIAGNOSIS — N1831 Chronic kidney disease, stage 3a: Secondary | ICD-10-CM | POA: Diagnosis not present

## 2020-06-24 DIAGNOSIS — G9341 Metabolic encephalopathy: Secondary | ICD-10-CM | POA: Diagnosis not present

## 2020-06-24 DIAGNOSIS — I2510 Atherosclerotic heart disease of native coronary artery without angina pectoris: Secondary | ICD-10-CM | POA: Diagnosis not present

## 2020-06-24 DIAGNOSIS — I1310 Hypertensive heart and chronic kidney disease without heart failure, with stage 1 through stage 4 chronic kidney disease, or unspecified chronic kidney disease: Secondary | ICD-10-CM | POA: Diagnosis not present

## 2020-06-30 ENCOUNTER — Ambulatory Visit (INDEPENDENT_AMBULATORY_CARE_PROVIDER_SITE_OTHER): Payer: Medicare Other | Admitting: *Deleted

## 2020-06-30 DIAGNOSIS — I442 Atrioventricular block, complete: Secondary | ICD-10-CM

## 2020-06-30 LAB — CUP PACEART REMOTE DEVICE CHECK
Date Time Interrogation Session: 20210816100705
Implantable Lead Implant Date: 20190225
Implantable Lead Implant Date: 20190225
Implantable Lead Location: 753859
Implantable Lead Location: 753860
Implantable Lead Model: 377
Implantable Lead Model: 377
Implantable Lead Serial Number: 80514208
Implantable Lead Serial Number: 80598782
Implantable Pulse Generator Implant Date: 20190225
Pulse Gen Model: 407145
Pulse Gen Serial Number: 69245949

## 2020-07-01 NOTE — Progress Notes (Signed)
Remote pacemaker transmission.   

## 2020-07-02 DIAGNOSIS — G9341 Metabolic encephalopathy: Secondary | ICD-10-CM | POA: Diagnosis not present

## 2020-07-02 DIAGNOSIS — I1310 Hypertensive heart and chronic kidney disease without heart failure, with stage 1 through stage 4 chronic kidney disease, or unspecified chronic kidney disease: Secondary | ICD-10-CM | POA: Diagnosis not present

## 2020-07-02 DIAGNOSIS — N1831 Chronic kidney disease, stage 3a: Secondary | ICD-10-CM | POA: Diagnosis not present

## 2020-07-02 DIAGNOSIS — I2510 Atherosclerotic heart disease of native coronary artery without angina pectoris: Secondary | ICD-10-CM | POA: Diagnosis not present

## 2020-07-03 DIAGNOSIS — G9341 Metabolic encephalopathy: Secondary | ICD-10-CM | POA: Diagnosis not present

## 2020-07-03 DIAGNOSIS — I2510 Atherosclerotic heart disease of native coronary artery without angina pectoris: Secondary | ICD-10-CM | POA: Diagnosis not present

## 2020-07-03 DIAGNOSIS — I1310 Hypertensive heart and chronic kidney disease without heart failure, with stage 1 through stage 4 chronic kidney disease, or unspecified chronic kidney disease: Secondary | ICD-10-CM | POA: Diagnosis not present

## 2020-07-03 DIAGNOSIS — N1831 Chronic kidney disease, stage 3a: Secondary | ICD-10-CM | POA: Diagnosis not present

## 2020-07-16 ENCOUNTER — Other Ambulatory Visit: Payer: Self-pay | Admitting: Cardiovascular Disease

## 2020-08-27 ENCOUNTER — Other Ambulatory Visit: Payer: Self-pay | Admitting: Cardiovascular Disease

## 2020-08-27 NOTE — Telephone Encounter (Signed)
Rx request sent to pharmacy.  

## 2020-09-11 ENCOUNTER — Encounter: Payer: Self-pay | Admitting: Cardiovascular Disease

## 2020-09-11 ENCOUNTER — Other Ambulatory Visit: Payer: Self-pay

## 2020-09-11 ENCOUNTER — Ambulatory Visit: Payer: Medicare Other | Admitting: Cardiovascular Disease

## 2020-09-11 VITALS — BP 150/74 | HR 62 | Ht 64.0 in | Wt 108.0 lb

## 2020-09-11 DIAGNOSIS — Z950 Presence of cardiac pacemaker: Secondary | ICD-10-CM | POA: Diagnosis not present

## 2020-09-11 DIAGNOSIS — I739 Peripheral vascular disease, unspecified: Secondary | ICD-10-CM | POA: Diagnosis not present

## 2020-09-11 DIAGNOSIS — I472 Ventricular tachycardia: Secondary | ICD-10-CM | POA: Diagnosis not present

## 2020-09-11 DIAGNOSIS — E785 Hyperlipidemia, unspecified: Secondary | ICD-10-CM

## 2020-09-11 DIAGNOSIS — I119 Hypertensive heart disease without heart failure: Secondary | ICD-10-CM

## 2020-09-11 DIAGNOSIS — G4733 Obstructive sleep apnea (adult) (pediatric): Secondary | ICD-10-CM

## 2020-09-11 DIAGNOSIS — I10 Essential (primary) hypertension: Secondary | ICD-10-CM | POA: Diagnosis not present

## 2020-09-11 DIAGNOSIS — I4729 Other ventricular tachycardia: Secondary | ICD-10-CM

## 2020-09-11 MED ORDER — METOPROLOL SUCCINATE ER 100 MG PO TB24
100.0000 mg | ORAL_TABLET | Freq: Every evening | ORAL | 1 refills | Status: DC
Start: 2020-09-11 — End: 2021-05-21

## 2020-09-11 MED ORDER — METOPROLOL SUCCINATE ER 25 MG PO TB24: 25.0000 mg | ORAL_TABLET | Freq: Every morning | ORAL | 3 refills | Status: DC

## 2020-09-11 NOTE — Patient Instructions (Signed)
Medication Instructions:  TAKE Metoprolol Succinate 25 mg in the morning and then 100 mg in the evening.  *If you need a refill on your cardiac medications before your next appointment, please call your pharmacy*   Lab Work: None ordered If you have labs (blood work) drawn today and your tests are completely normal, you will receive your results only by: Marland Kitchen MyChart Message (if you have MyChart) OR . A paper copy in the mail If you have any lab test that is abnormal or we need to change your treatment, we will call you to review the results.   Testing/Procedures: None ordered   Follow-Up: At [facility], you and your health needs are our priority.  As part of our continuing mission to provide you with exceptional heart care, we have created designated Provider Care Teams.  These Care Teams include your primary Cardiologist (physician) and Advanced Practice Providers (APPs -  Physician Assistants and Nurse Practitioners) who all work together to provide you with the care you need, when you need it.  We recommend signing up for the patient portal called "MyChart".  Sign up information is provided on this After Visit Summary.  MyChart is used to connect with patients for Virtual Visits (Telemedicine).  Patients are able to view lab/test results, encounter notes, upcoming appointments, etc.  Non-urgent messages can be sent to your provider as well.   To learn more about what you can do with MyChart, go to [email or web address].    Your next appointment:   12 month(s)  The format for your next appointment:   In Person  Provider:   You may see Shelva Majestic, MD or one of the following Advanced Practice Providers on your designated Care Team:    Almyra Deforest, PA-C  Fabian Sharp, PA-C or   Roby Lofts, [place]

## 2020-09-11 NOTE — Progress Notes (Signed)
Cardiology Office Note    Date:  09/18/2020   ID:  Julie Bass, DOB [birth year], MRN [id number]  PCP:  Denita Lung, MD  Cardiologist:  Shelva Majestic, MD , Dr. Sallyanne Kuster (pacemaker)  1 year follow-up evaluation  History of Present Illness:  Julie Bass is a 86 y.o. female who has a history of hypertension, hyperlipidemia, peripheral vascular disease, as well as episodes of nonsustained ventricular tachycardia, for, which she's been treated with beta blocker therapy. An echo Doppler study in December 2013 showed hyperdynamic LV function with an ejection fraction of at least 40%, grade 1 diastolic dysfunction, and dynamic obstruction in the mid cavity of the left ventricle with a gradient of 10 mm. She had severely calcified mitral annulus and mild left atrial dilatation. She has documented nonsustained ventricular tachycardia, which was picked up on a remote event monitor, and she has tolerated titration of her beta blocker therapy. She also has had difficulty with statin intolerance for hyperlipidemia, but has tolerated livalo 2 mg daily. She also has history of hypertension and has been on losartan 100 mg in addition to her metoprolol. She has peripheral vascular disease and is status post stenting of her left SFA in the past. She has one vessel runoff bilaterally and has moderate right SFA disease. Her last LE Doppler study was in August 2013 , which showed occluded right PTA and peroneal occluded left PTA, left ATA with reconstitution of the dorsalis pedis artery. ABIs were 0.88 on the right and 0.86 on the left. She has been on aspirin 81 mg, and Plavix 75 mg   A 2 year followup echo Doppler study on 05/15/2014 showed an ejection fraction of 65-70% and there was grade 1 diastolic dysfunction. There was moderate mitral annular calcification. There was no mention of any dynamic obstruction. Lower extremity Doppler studies revealed a patent left SFA stent with prior disease  noted in several vessels, not significantly changed from 2013.  She was hospitalized on 08/23/2014 after developing a syncopal spell. She was not significantly orthostatic to her hospitalization. Upon presentation her blood pressure was 170/58. Her ECG showed sinus rhythm with mild ST changes laterally, and first degree heart block. She was noted to have a single 15 beat run of nonsustained VT. She ultimately underwent implantation of a loop recorder on 08/27/2014. She underwent carotid duplex imaging and findings suggest 1-39% internal carotid artery stenosis bilaterally. The left vertebral artery is patent with antegrade flow but the the right vertebral artery was not able to be visualized.  After discharge, the loop recorder detected 2 episodes of questionable transient asystole. Upon review of these tracings with Dr. Sallyanne Kuster it appears that these may very well be artifactual. One episode was recorded on October 14, and another on October 16. The episode in October 16 tach, rate of less than over 30 seconds. The patient was with her daughter, who is my nurse at the time. She denies any symptoms at this time. Specifically, she denied any lightheadedness, and one would expect that with a >30 second pause she would have been significantly symptomatic and this would have also been noted by her daughter.  She denies any major episodes of dizziness but if she gets up quickly or lies down fast. A CT of her abdomen showed atherosclerotic plaque throughout her abdominal aorta with suggestion of a tiny 0.5 x 0.2 cm atherosclerotic ulcer. She was also noted to have plaque that was irregular throughout the right superficial femoral artery and also  plaque in the popliteal artery, not resulting in hemodynamically significance stenosis. She had single-vessel runoff to the right lower leg and foot. The right anterior tibial artery with atretic reconstitution of both posterior tibial and peroneal  vascular distributions. The right dorsalis pedis artery was patent to the level of the forefoot. She had stents noted in the distal aspect of her left common femoral artery and single vessel runoff to the left lower leg and foot. There was occlusion of both the left anterior, posterior tibial arteries. The peroneal artery was found to reconstitute a dorsalis pedis artery at the level of the hindfoot. 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   She has underone neurologic evaluation with Dr. Corwin Levins and is felt to have moderate dementia felt to be Alzheimer's versus vascular dementia. A CT scan of her head revealed mild cortical atrophy and moderately advanced small vessel ischemic changes involving the periventricular white matter with remote infarction in the right frontal lobe.   In October 2017, she was found to have an episode of irregular nonsustained wide complex tachycardia with a maximum rate at 167 bpm and ultimately reverted back to sinus rhythm. She was completely asymptomatic and this occurred while she was in bed. At that time, her metoprolol dose, which had been 75 Mill grams twice a day was increased to 100 mg in the morning and 75 mg at night. She has continued to take losartan 100 mg daily as well as amlodipine 5 mg for blood pressure control. She has felt well on the increased beta blocker regimen. She has a history of hyperlipidemia and has been able to tolerate Livalo but could not tolerate other statins. Her current Livalo dose is mg daily and she also takes Zetia 10 mg. She is on gabapentin for neuropathy. She also is on Celexa. She underwent an echo Doppler study yesterday, 10/13/2016 which showed an EF of 55-60%. There was mild LVH. There was grade 2 diastolic dysfunction with abnormal tissue Doppler suggesting high ventricular filling pressures. Her aortic valve  mobility was restricted but she did not have stenosis. There was mitral annular calcification. There is mild left atrial dilatation. There was mild increased pulmonary pressure.   She has had issues with short-term memory loss. When I last saw her in May 2018 she denied any episodes of chest pain or shortness of breath. She was fatigued and was sleeping a lot. She denied any episodes of tachycardia, presyncope or syncope. A loop recorder assessment from 01/15/2017 through 03/15/2017 did not show any episodes of atrial fibrillation, tachycardia, bradycardia, pauses. Laboratory was done 2 days ago. TSH 2.33. Hemoglobin 14.9, hematocrit 45.7. BUN 18, creatinine 1.24. LFTs were normal. On Livalo cholesterol 186, HDL 79, triglycerides 85, and LDL 90.   She developed an episode of syncope in February 2019 and was found to have complete heart block with a prolonged sinus pause for close to 30 seconds. She was seen by Dr. Lovena Le during her hospitalization and underwent insertion of a permanent pacemaker [employer] 8 DR-T. Echo Doppler study on January 10, 2018 showed an EF of 70 to 75%. There was dynamic obstruction at the mid cavity level with a peak velocity of 300 cm/s with peak gradient of 36 mm. There was grade 1 diastolic dysfunction. There was moderately severe mitral annular calcification. She was subsequently seen in the office by Dr. Sallyanne Kuster who follows her pacemaker. Remotely, she had previously experienced  episodes of nonsustained VT. Her beta-blocker had been held prior to insertion of her pacemaker after she presented with her prolonged pause and there was plans to later potentially reinitiate therapy. There is also been concern in the past of probable obstructive sleep apnea but the patient had still never been tested.  She has seen Dr. Sallyanne Kuster in follow-up of an emergency room evaluation in May when she presented with generalized weakness to the emergency room.  Her pacemaker was interrogated and there was no signs of arrhythmia. She did not have any associated chest pain. She was ultimately referred for a home sleep study due to significant concerns for obstructive sleep apnea. She was found to have severe obstructive sleep apnea with an AHI 51.8/h. Events were worse with supine position with an AHI 53.6/h. Oxygen desaturated to 87%.  Her CPAP set up date was May 19, 2018 with [facility] is her DME company. An initial download obtained in the office for the last 4 weeks today to the office today which showed very poor compliance with only 5 out of 17 usage days and average usage at 3 hours and 12 minutes. According to the patient's daughter, the patient is very tired. Her sleep is nonrestorative. She snores. She has no energy.   When I saw her in July 2019 I discussed the importance of initiating CPAP with compliance. Her grandson was putting the mask on for proper fitting. At her last office visit in September 2019 anew download was obtained from July 21 through July 03, 2018 and she met compliance with 80% of usage days and usage greater than 4 hours at 70%. She was averaging 7 hours and 58 minutes per night. Her AHI was 3.7 on CPAP auto with a 95th percentile pressure at 10.5. She had occasional moderate mask leak.  Recently, she often has not been using her CPAP since she often waits for her grandson to come home late to put it on and then at times she does not remember how to put the mask on herself. As result, a recent download from August 31 through September 29 showed very poor compliance. Usage days was only 17%. She is unaware of any episodes of presyncope or syncope. She denies any episodes of chest pain. She is unaware of palpitations. She continues to be on aspirin and Plavix. She continues to tolerate Livalo 2 mg in addition to Zetia for hyperlipidemia. She is on losartan 100 mg, Toprol-XL 100 mg in addition to  amlodipine 2.5 mg for hypertension.   She was evaluated in a telemedicine evaluation by Dr. Sallyanne Kuster in July 2020.  Due to right calf intermittent claudication Pletal 50 mg twice a day was added to her regimen.  He was felt to have normal device function on her pacemaker.  She was last evaluated by me in a telemedicine visit in September 2020.  At that time she admitted to having both good and bad days. She felt that she did not feel too well today.  She continues to experience some leg weakness.  She has not had recent laboratory.  She is unaware of any palpitations.  According to her grandson there was no swelling in her legs.  There was no wheezing.  He has been helping her apply her CPAP mask and she intermittently uses therapy.  She has not been walking much.  She denied chest pain PND or orthopnea.  She continued to take Zetia and Livalo for hyperlipidemia.   Over the past year,  she tested positive for Covid and was hospitalized at [facility] for 1 night in November 2013.  He had been treated with antibiotics for urinary tract infection but was not symptomatic with Covid tested positive.  She also has had recurrent urinary tract infection with urosepsis and altered mental status.  Due to the Covid pandemic, she has not been back to the office  has not had a follow-up evaluation with Dr. Sallyanne Kuster for her pacemaker.  Presently, she has been taking amlodipine 2.5 mg and metoprolol succinate 100 mg daily for blood pressure and her history of prior SVT/NSVT.  She continues to tolerate Livalo 2 mg in addition to Zetia and Lovaza for hyperlipidemia.  She is on pantoprazole for GERD.  She presents for a yearly evaluation as well as a pacemaker interrogation.   Past Medical History:  Diagnosis Date  . Arthritis   . Chronic kidney disease    KIDNEY STONES  . Dementia ([place])    BEGINNING STAGES   . Depression   . GERD (gastroesophageal reflux disease)   . Glaucoma   . Hiatal hernia   .  HTN (hypertension) 10/18/2011  . Hyperlipemia 10/18/2011  . NSVT (nonsustained ventricular tachycardia) ([place]) 10/18/2011  . Osteoporosis   . PAD (peripheral artery disease) ([place]) 10/18/2011   h/o left SFA stent    Past Surgical History:  Procedure Laterality Date  . ABDOMINAL HYSTERECTOMY  1973  . CARDIAC CATHETERIZATION     2012  . CHOLECYSTECTOMY    . LOOP RECORDER IMPLANT N/A 08/27/2014   Procedure: LOOP RECORDER IMPLANT;  Surgeon: Coralyn Mark, MD;  Location: [place] CATH [facility];  Service: Cardiovascular;  Laterality: N/A;  . LOOP RECORDER REMOVAL N/A 01/09/2018   Procedure: LOOP RECORDER REMOVAL;  Surgeon: Evans Lance, MD;  Location: [place] [facility];  Service: Cardiovascular;  Laterality: N/A;  . LOWER EXTREMITY ANGIOGRAM  11/02/2007   stent of mid left SFA with [id number] EV3 self-expanding stent and 6x3 in prox region (Dr. JAdora Fridge  . LOWER EXTREMITY ANGIOGRAM    . LOWER EXTREMITY ARTERIAL DOPPLER  2013   right SFA with 50-69% diameter reduction, right PTA/peroneal occluded, L SFA prox to stent has narrowing with increased velocities >60% diameter reduction, L SFA stent patent, L ATA w/occlusive disease, bilat ABIs show mild arterial insuffiency at rest  . NM MYOCAR PERF WALL MOTION  10/2011   non-gated - normal stuy, EF 82%, normal LV wall motion  . PACEMAKER IMPLANT N/A 01/09/2018   Procedure: PACEMAKER IMPLANT;  Surgeon: TEvans Lance MD;  [facility];  Service: Cardiovascular;  Laterality: N/A;  . REVERSE SHOULDER ARTHROPLASTY Right 01/26/2013   Procedure: RIGHT REVERSE TOTAL SHOULDER ARTHROPLASTY;  Surgeon: SAugustin Schooling MD;  Location: [facility]  Service: Orthopedics;  Laterality: Right;  . TRANSTHORACIC ECHOCARDIOGRAM  10/2012   EF 75-70%, mod conc hypertrophy, severely calcified MV annulus, LA mildly dailted, PA peak pressure 390mg    Current Medications: Outpatient Medications Prior to Visit  Medication Sig Dispense Refill  . acetaminophen (TYLENOL) 325  MG tablet Take 2 tablets (650 mg total) by mouth every 6 (six) hours as needed for mild pain (or Fever >/= 101).    . Marland KitchenmLODipine (NORVASC) 2.5 MG tablet TAKE 1 TABLET (2.5 MG TOTAL) BY MOUTH EVERY EVENING. 60 tablet 1  . CRANBERRY PO Take 1 capsule by mouth daily.    . feeding supplement, ENSURE COMPLETE, (ENSURE COMPLETE) LIQD Take 237 mLs by mouth 3 (three) times  daily between meals. (Patient taking differently: Take 237 mLs by mouth See admin instructions. Once to twice daily)    . geriatric multivitamins-minerals (ELDERTONIC/GEVRABON) LIQD Take 15 mLs by mouth daily.    Marland Kitchen omega-3 acid ethyl esters (LOVAZA) 1 g capsule TAKE 1 CAPSULE (1 G TOTAL) BY MOUTH EVERY EVENING. 90 capsule 3  . pantoprazole (PROTONIX) 40 MG tablet Take 1 tablet (40 mg total) by mouth daily. (Patient taking differently: Take 40 mg by mouth daily as needed (for acid reflux). ) 90 tablet 3  . Pitavastatin Calcium (LIVALO) 2 MG TABS Take 1 tablet (2 mg total) by mouth daily. 90 tablet 1  . timolol (TIMOPTIC) 0.5 % ophthalmic solution Place 1 drop into both eyes 2 (two) times daily.     . cilostazol (PLETAL) 50 MG tablet TAKE 1 TABLET BY MOUTH TWICE A DAY 120 tablet 1  . ezetimibe (ZETIA) 10 MG tablet TAKE 1 TABLET BY MOUTH EVERY DAY (Patient taking differently: Take 10 mg by mouth daily. ) 90 tablet 3  . metoprolol succinate (TOPROL-XL) 100 MG 24 hr tablet Take 1 tablet (100 mg total) by mouth daily. Please take in morning after breakfast 90 tablet 1  . citalopram (CELEXA) 10 MG tablet TAKE 1 TABLET BY MOUTH EVERY DAY (Patient not taking: Reported on 09/11/2020) 90 tablet 1   No facility-administered medications prior to visit.     Allergies:   Atorvastatin calcium, Crestor [rosuvastatin calcium], Lipitor [atorvastatin calcium], and Rosuvastatin calcium   Social History   Socioeconomic History  . Marital status: Widowed    Spouse name: Not on file  . Number of children: 4  . Years of education: Not on file  . Highest  education level: Not on file  Occupational History  . Not on file  Tobacco Use  . Smoking status: Former Smoker    Quit date: 05/07/1984    Years since quitting: 36.3  . Smokeless tobacco: Never Used  Substance and Sexual Activity  . Alcohol use: No  . Drug use: No  . Sexual activity: Not Currently  Other Topics Concern  . Not on file  Social History Narrative  . Not on file   Social Determinants of Health   Financial Resource Strain:   . Difficulty of Paying Living Expenses: Not on file  Food Insecurity:   . Worried About Charity fundraiser in the Last Year: Not on file  . Ran Out of Food in the Last Year: Not on file  Transportation Needs:   . Lack of Transportation (Medical): Not on file  . Lack of Transportation (Non-Medical): Not on file  Physical Activity:   . Days of Exercise per Week: Not on file  . Minutes of Exercise per Session: Not on file  Stress:   . Feeling of Stress : Not on file  Social Connections:   . Frequency of Communication with Friends and Family: Not on file  . Frequency of Social Gatherings with Friends and Family: Not on file  . Attends Religious Services: Not on file  . Active Member of Clubs or Organizations: Not on file  . Attends Archivist Meetings: Not on file  . Marital Status: Not on file     Family History:  The patient's family history includes Breast cancer in her sister; CAD in her son; Cancer in her mother; Colon cancer in her brother; Hypertension in her daughter; Pulmonary embolism in her daughter; Throat cancer in her father.   ROS General: Negative; No  fevers, chills, or night sweats;  HEENT: Negative; No changes in vision or hearing, sinus congestion, difficulty swallowing Pulmonary: Negative; No cough, wheezing, shortness of breath, hemoptysis Cardiovascular: Negative; No chest pain, presyncope, syncope, palpitations GI: Negative; No nausea, vomiting, diarrhea, or abdominal pain GU: Frequent urinary tract  infections Musculoskeletal: Negative; no myalgias, joint pain, or weakness Hematologic/Oncology: Negative; no easy bruising, bleeding Endocrine: Negative; no heat/cold intolerance; no diabetes Neuro: Negative; no changes in balance, headaches Skin: Negative; No rashes or skin lesions Psychiatric: Negative; No behavioral problems, depression Sleep: History of obstructive sleep apnea with previous CPAP use, currently not using, Other comprehensive 14 point system review is negative.   PHYSICAL EXAM:   VS:  BP (!) 150/74   Pulse 62   Ht 5' 4"  (1.626 m)   Wt 108 lb (49 kg)   SpO2 98%   BMI 18.54 kg/m     Repeat blood pressure by me was 150/74  Wt Readings from Last 3 Encounters:  09/11/20 108 lb (49 kg)  06/12/20 110 lb 9.6 oz (50.2 kg)  05/28/20 114 lb 10.2 oz (52 kg)    General: Alert, oriented, no distress.  Skin: normal turgor, no rashes, warm and dry HEENT: Normocephalic, atraumatic. Pupils equal round and reactive to light; sclera anicteric; extraocular muscles intact; Nose without nasal septal hypertrophy Mouth/Parynx benign; Mallinpatti scale 33 Neck: No JVD, no carotid bruits; normal carotid upstroke Lungs: clear to ausculatation and percussion; no wheezing or rales Chest wall: without tenderness to palpitation Heart: PMI not displaced, RRR, s1 s2 normal, 8-6/7 systolic murmur, no diastolic murmur, no rubs, gallops, thrills, or heaves Abdomen: soft, nontender; no hepatosplenomehaly, BS+; abdominal aorta nontender and not dilated by palpation. Back: no CVA tenderness Pulses 2+ Musculoskeletal: full range of motion, normal strength, no joint deformities Extremities: no clubbing cyanosis or edema, Homan's sign negative  Neurologic: grossly nonfocal; Cranial nerves grossly wnl Psychologic: Normal mood and affect      Studies/Labs Reviewed:   EKG:  EKG is ordered today.  ECG (independently read by me): Sinus rhythm at 62 bpm with first-degree AV block with PR interval  252 ms.  LVH with repolarization changes.  Occasional atrial paced rhythm with mostly atrial sensing.   Since the patient had not been able to have an interrogation of her pacemaker since Mar 29, 2018 (implant date January 09, 2018) the [employer] rep was here and her pacemaker was interrogated today. She was pacing with  high output and the rep decreased her voltage from 3 V to 2 V.  She was 46% atrial paced 0 ventricular paced.  Battery life wasat 80%.  She had 27 short high ventricular rate episodes over 2-1/2 years with a longest episode lasting 10 hours.  She had episodes of SVT and NSVT with the fastest heart rate 205 bpm of short duration.  Underlying rhythm was sinus rhythm at 65 with occasional PVCs.    Recent Labs: BMP Latest Ref Rng & Units 06/12/2020 06/01/2020 05/31/2020  Glucose 65 - 99 mg/dL 106(H) 131(H) 155(H)  BUN 10 - 36 mg/dL 17 17 14   Creatinine 0.57 - 1.00 mg/dL 1.16(H) 1.09(H) 1.31(H)  BUN/Creat Ratio 12 - 28 15 - -  Sodium 134 - 144 mmol/L 139 142 138  Potassium 3.5 - 5.2 mmol/L 4.8 3.9 3.3(L)  Chloride 96 - 106 mmol/L 105 111 110  CO2 20 - 29 mmol/L 23 21(L) 19(L)  Calcium 8.7 - 10.3 mg/dL 10.1 9.2 9.3     Hepatic Function Latest Ref Rng &  Units 06/12/2020 05/31/2020 05/30/2020  Total Protein 6.0 - 8.5 g/dL 6.5 5.9(L) 6.2(L)  Albumin 3.5 - 4.6 g/dL 4.5 3.3(L) 3.6  AST 0 - 40 IU/L 27 29 40  ALT 0 - 32 IU/L 22 20 20   Alk Phosphatase 48 - 121 IU/L 40(L) 32(L) 32(L)  Total Bilirubin 0.0 - 1.2 mg/dL 0.6 1.1 1.6(H)  Bilirubin, Direct 0.0 - 0.2 mg/dL - 0.1 -    CBC Latest Ref Rng & Units 06/12/2020 06/01/2020 05/31/2020  WBC 3.4 - 10.8 x10E3/uL 5.4 6.2 7.5  Hemoglobin 11.1 - 15.9 g/dL 13.0 13.3 13.6  Hematocrit 34.0 - 46.6 % 41.5 41.2 42.0  Platelets 150 - 450 x10E3/uL 219 203 212   Lab Results  Component Value Date   MCV 100 (H) 06/12/2020   MCV 98.1 06/01/2020   MCV 97.7 05/31/2020   Lab Results  Component Value Date   TSH 1.974 05/29/2020   Lab Results    Component Value Date   HGBA1C 5.1 01/08/2018     BNP No results found for: BNP  ProBNP    Component Value Date/Time   PROBNP 93.7 10/18/2011 2002     Lipid Panel     Component Value Date/Time   CHOL 166 08/08/2019 1201   TRIG 91 08/08/2019 1201   HDL 91 08/08/2019 1201   CHOLHDL 1.8 08/08/2019 1201   CHOLHDL 2.2 01/08/2018 0639   VLDL 15 01/08/2018 0639   LDLCALC 59 08/08/2019 1201   LABVLDL 16 08/08/2019 1201     RADIOLOGY: No results found.   Additional studies/ records that were reviewed today include:  I reviewed the hospital records since the patient's last visit with me.   ASSESSMENT:    1. Essential hypertension   2. Pacemaker   3. NSVT (nonsustained ventricular tachycardia) ([place])   4. OSA (obstructive sleep apnea)   5. PAD (peripheral artery disease) ([place])   6. Hypertensive heart disease without heart failure   7. Dyslipidemia     PLAN:  1.  Essential hypertension: Blood pressure today is mildly elevated despite taking amlodipine 2.5 mg metoprolol succinate 100 mg daily.  With her pacemaker interrogation showing 27 high ventricular rate episodes with SVT and non-SVT I have recommended slight titration of Toprol-XL and she will take 25 mg in the morning and continue to take 100 mg at bedtime.  2.  Permanent pacemaker.  The patient's pacemaker had not been interrogated since May 2019.  Since the patient has not been in the office to see Dr. Sallyanne Kuster he was at the town, the pacemaker rep was notified and pacemaker interrogation was performed.  She was still at high output of 3 V and this was reduced to 2 V.  As noted above she was 46% atrial paced and 0 ventricular paced.  Battery life was at 80%.  There were several short high ventricular rate episodes over the 2-1/2 years with the longest episode lasting 10 seconds and had bursts of SVT and non-SVT.  3.  Hyperlipidemia: She continues to be on Livalo 2 mg which she is able to tolerate but was on able to  tolerate other statins.  She continues also to be on Zetia 10 mg and low positive.  In September 2020 LDL cholesterol was 59 with total cholesterol 166.  Repeat laboratory in the fasting state was recommended.  4.  OSA: The patient no longer is using CPAP therapy.  We discussed possible reinstitution of this  5.  Moderate concentric LVH with LVOT gradient at the  mid cavity level.  Grade 2 diastolic dysfunction.  6.  PVD.  She continues to be on cilostazol 50 mg twice a day.  7.  Mild dementia: Slowly progressive, intolerant to cholinesterase inhibitors   Medication Adjustments/Labs and Tests Ordered: Current medicines are reviewed at length with the patient today.  Concerns regarding medicines are outlined above.  Medication changes, Labs and Tests ordered today are listed in the Patient Instructions below. Patient Instructions  Medication Instructions:  TAKE Metoprolol Succinate 25 mg in the morning and then 100 mg in the evening.  *If you need a refill on your cardiac medications before your next appointment, please call your pharmacy*   Lab Work: None ordered If you have labs (blood work) drawn today and your tests are completely normal, you will receive your results only by: Marland Kitchen MyChart Message (if you have MyChart) OR . A paper copy in the mail If you have any lab test that is abnormal or we need to change your treatment, we will call you to review the results.   Testing/Procedures: None ordered   Follow-Up: At [facility], you and your health needs are our priority.  As part of our continuing mission to provide you with exceptional heart care, we have created designated Provider Care Teams.  These Care Teams include your primary Cardiologist (physician) and Advanced Practice Providers (APPs -  Physician Assistants and Nurse Practitioners) who all work together to provide you with the care you need, when you need it.  We recommend signing up for the patient portal called  "MyChart".  Sign up information is provided on this After Visit Summary.  MyChart is used to connect with patients for Virtual Visits (Telemedicine).  Patients are able to view lab/test results, encounter notes, upcoming appointments, etc.  Non-urgent messages can be sent to your provider as well.   To learn more about what you can do with MyChart, go to [email or web address].    Your next appointment:   12 month(s)  The format for your next appointment:   In Person  Provider:   You may see Shelva Majestic, MD or one of the following Advanced Practice Providers on your designated Care Team:    Almyra Deforest, PA-C  Fabian Sharp, PA-C or   Roby Lofts, [place]       Signed, Shelva Majestic, MD  09/18/2020 6:41 PM    [facility] [address] [place], [place], [place]  [address] Phone: [phone]  Days.

## 2020-09-16 ENCOUNTER — Other Ambulatory Visit: Payer: Self-pay | Admitting: Cardiovascular Disease

## 2020-09-16 ENCOUNTER — Telehealth: Payer: Self-pay | Admitting: Family Medicine

## 2020-09-16 MED ORDER — SULFAMETHOXAZOLE-TRIMETHOPRIM 800-160 MG PO TABS: 1.0000 | ORAL_TABLET | Freq: Two times a day (BID) | ORAL | 0 refills | Status: DC

## 2020-09-16 NOTE — Telephone Encounter (Signed)
Please call Hortense Ramal called and said her mom is having UTI symptoms starting and wants to know if you can either send in antibiotic for her or does she need to do urine specimen? She is having back and stomach pain, which is typical for how hers start out. She was in hospital in August for it and UTI's can get really bad with her and she trying to catch it early. Per [name] you have given her Bactrim in the past for it but in hospital she was given Levaquin and that worked well for her.  [name] is still recovering after back surgery and can't get her in but if you need specimen, she will have someone come by and pick up specimen come and bring it back in.   [place]

## 2020-09-16 NOTE — Telephone Encounter (Signed)
Let her know that I called in the Septra

## 2020-09-16 NOTE — Telephone Encounter (Signed)
Left message for Julie Bass, patient's daughter that rx Sarina Ill has been sent in

## 2020-09-18 ENCOUNTER — Encounter: Payer: Self-pay | Admitting: Cardiovascular Disease

## 2020-09-23 ENCOUNTER — Ambulatory Visit: Payer: Medicare Other | Admitting: Family Medicine

## 2020-09-29 ENCOUNTER — Ambulatory Visit (INDEPENDENT_AMBULATORY_CARE_PROVIDER_SITE_OTHER): Payer: Medicare Other

## 2020-09-29 DIAGNOSIS — I442 Atrioventricular block, complete: Secondary | ICD-10-CM

## 2020-09-30 DIAGNOSIS — H349 Unspecified retinal vascular occlusion: Secondary | ICD-10-CM | POA: Diagnosis not present

## 2020-09-30 DIAGNOSIS — H401133 Primary open-angle glaucoma, bilateral, severe stage: Secondary | ICD-10-CM | POA: Diagnosis not present

## 2020-09-30 DIAGNOSIS — H524 Presbyopia: Secondary | ICD-10-CM | POA: Diagnosis not present

## 2020-09-30 LAB — CUP PACEART REMOTE DEVICE CHECK
Date Time Interrogation Session: 20211115175044
Implantable Lead Implant Date: 20190225
Implantable Lead Implant Date: 20190225
Implantable Lead Location: 753859
Implantable Lead Location: 753860
Implantable Lead Model: 377
Implantable Lead Model: 377
Implantable Lead Serial Number: 80514208
Implantable Lead Serial Number: 80598782
Implantable Pulse Generator Implant Date: 20190225
Pulse Gen Model: 407145
Pulse Gen Serial Number: 69245949

## 2020-09-30 NOTE — Progress Notes (Signed)
Remote pacemaker transmission.   

## 2020-10-31 DIAGNOSIS — H401133 Primary open-angle glaucoma, bilateral, severe stage: Secondary | ICD-10-CM | POA: Diagnosis not present

## 2020-10-31 DIAGNOSIS — H349 Unspecified retinal vascular occlusion: Secondary | ICD-10-CM | POA: Diagnosis not present

## 2020-11-20 IMAGING — CT CT HEAD W/O CM
4 series · 16 of 47 positions shown, 18 images · non-contrast
Comparison: 09/17/2019

CLINICAL DATA: Encephalopathy.

EXAM:
CT HEAD WITHOUT CONTRAST
TECHNIQUE: Contiguous axial images were obtained from the base of the skull
through the vertex without intravenous contrast.

[Series 3: head wo · axial · 0.44mm/px · z∈[-91,+29]mm · 7 of 33 slices shown, 9 images]
[im 5/33  brain]
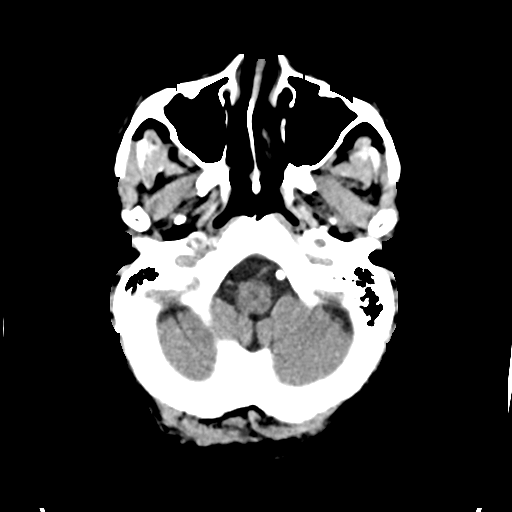
[im 5/33  bone]
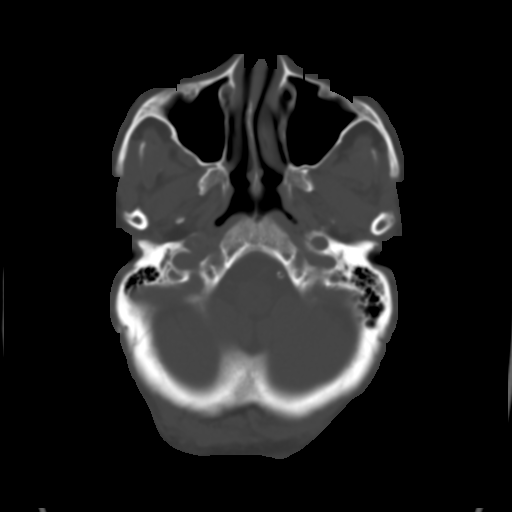
[im 9/33  brain]
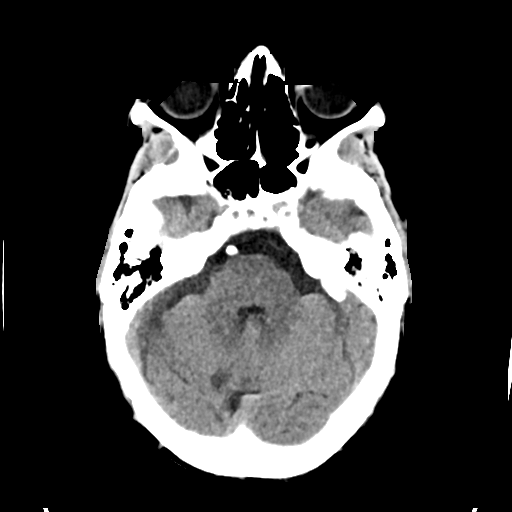
[im 13/33  brain]
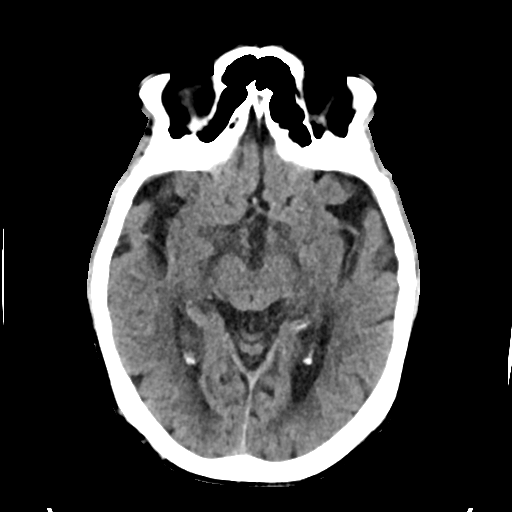
[im 17/33  brain]
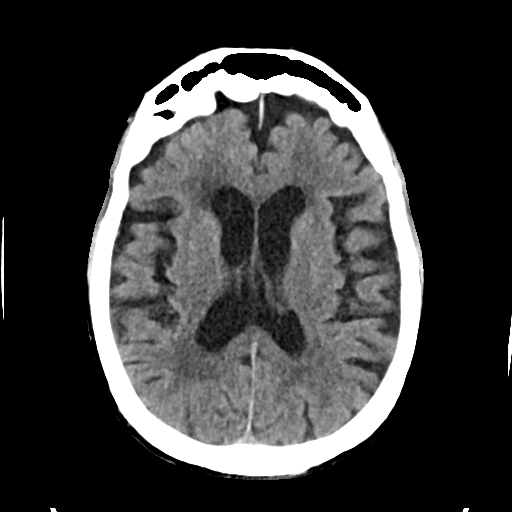
[im 21/33  brain]
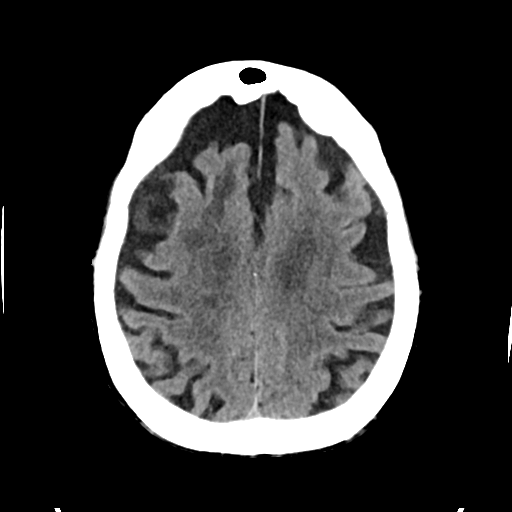
[im 21/33  bone]
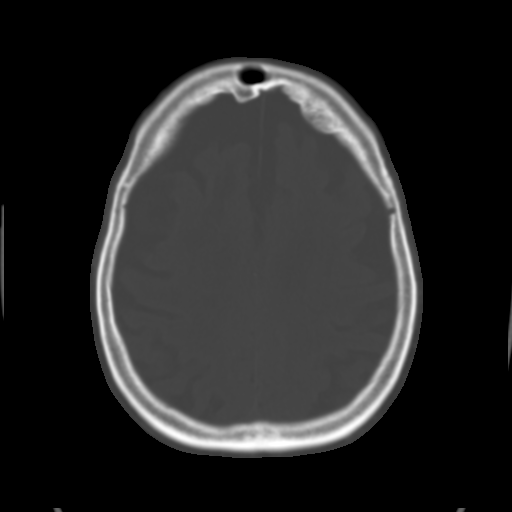
[im 25/33  brain]
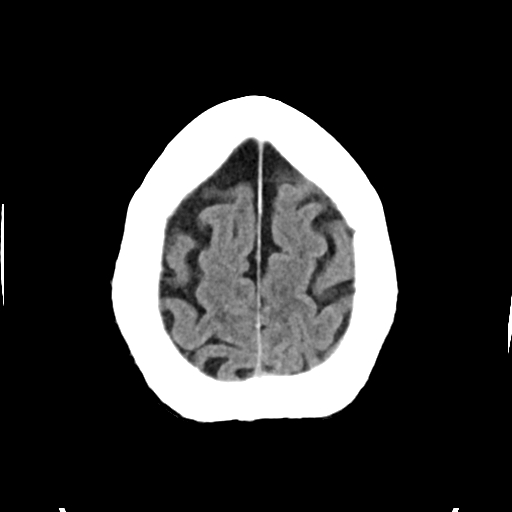
[im 29/33  brain]
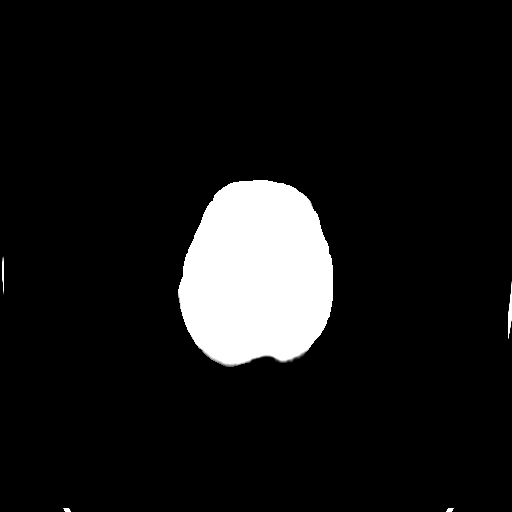

[Series 4: head bone · axial · 0.44mm/px · z∈[-95,-63]mm · 3 of 81 slices shown]
[im 9/81  bone]
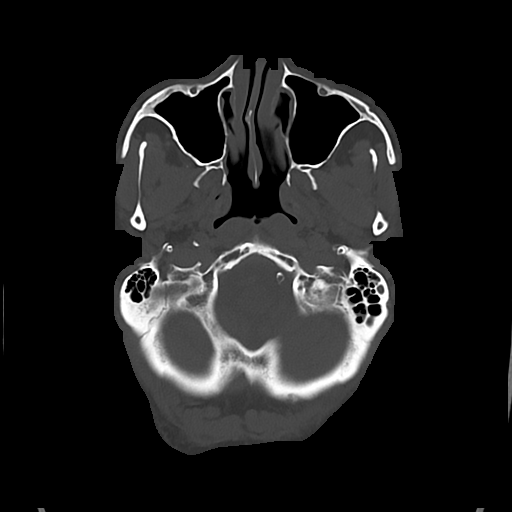
[im 17/81  bone]
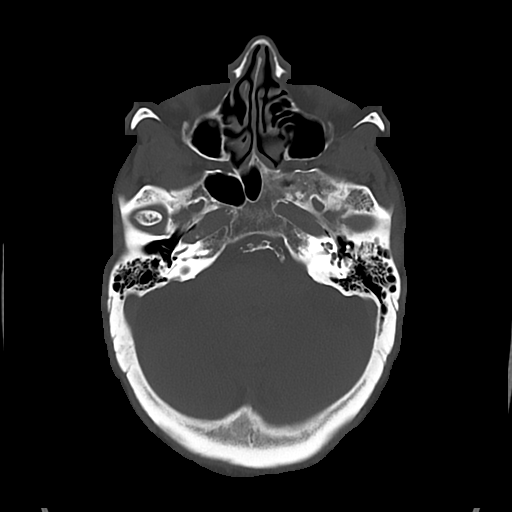
[im 25/81  bone]
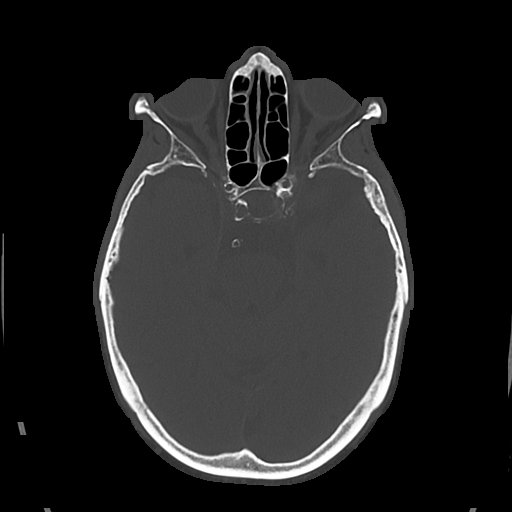

[Series 5: cor soft · coronal · 0.31mm/px · 3 of 72 slices shown]
[im 24/72  brain]
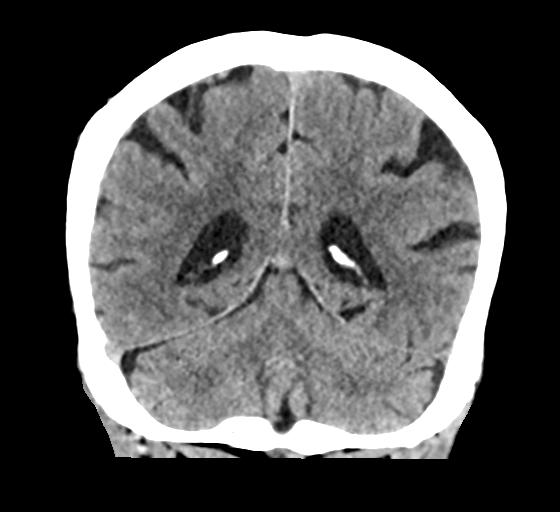
[im 32/72  brain]
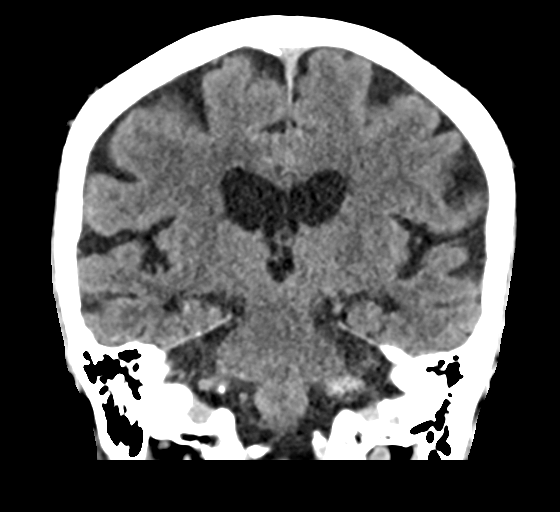
[im 40/72  brain]
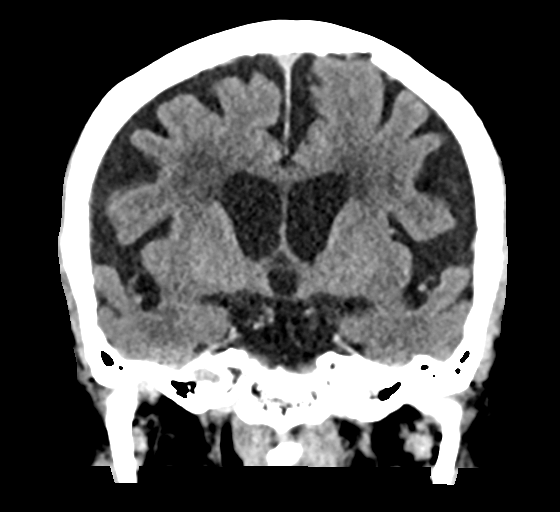

[Series 6: sag soft · sagittal · 0.29mm/px · 3 of 58 slices shown]
[im 20/58  brain]
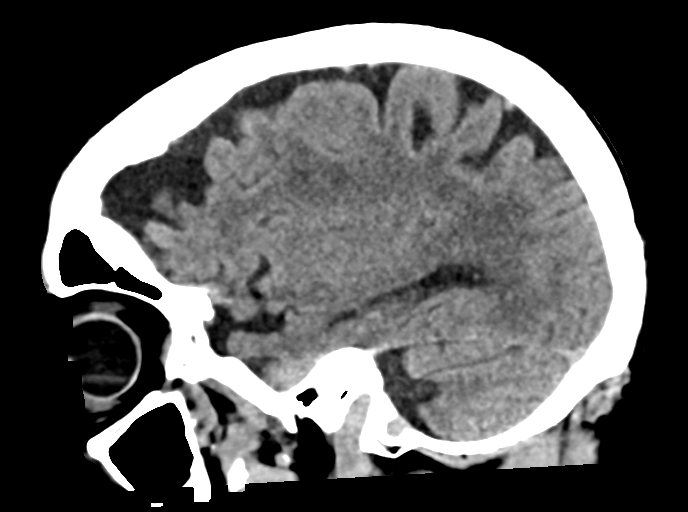
[im 29/58  brain]
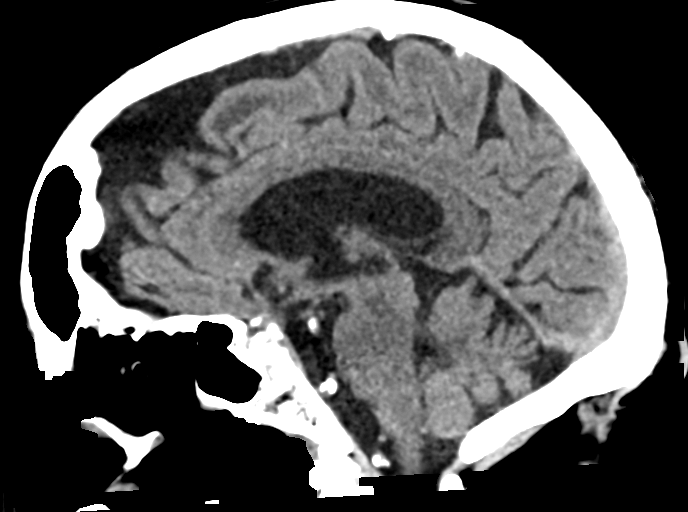
[im 39/58  brain]
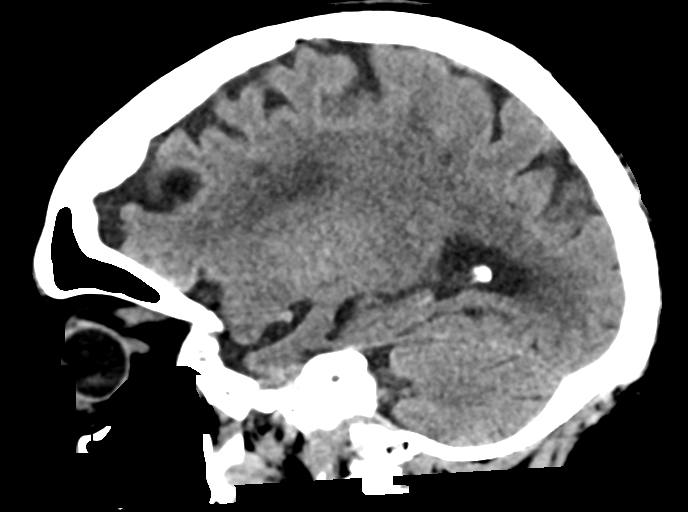

[16 of 47 positions shown; findings below may reference images not displayed]

FINDINGS: Brain: Stable age related cerebral atrophy, ventriculomegaly and
periventricular white matter disease. No extra-axial fluid
collections are identified. No CT findings for acute hemispheric
infarction or intracranial hemorrhage. No mass lesions. The
brainstem and cerebellum are normal.

Vascular: Stable advanced vascular calcifications but no definite
aneurysm or hyperdense vessels.

Skull: No skull fracture or bone lesions.

Sinuses/Orbits: The paranasal sinuses and mastoid air cells are
clear except for some mucoperiosteal thickening in the right
maxillary sinus. The globes are intact.

Other: No scalp lesions or scalp hematoma.
IMPRESSION: 1. Stable age related cerebral atrophy, ventriculomegaly and
periventricular white matter disease.
2. No acute intracranial findings or mass lesions.

## 2020-12-11 ENCOUNTER — Other Ambulatory Visit: Payer: Self-pay | Admitting: Cardiovascular Disease

## 2020-12-15 ENCOUNTER — Other Ambulatory Visit: Payer: Self-pay | Admitting: *Deleted

## 2020-12-15 MED ORDER — CITALOPRAM HYDROBROMIDE 10 MG PO TABS
10.0000 mg | ORAL_TABLET | Freq: Every day | ORAL | 1 refills | Status: DC
Start: 2020-12-15 — End: 2021-01-23

## 2020-12-15 NOTE — Telephone Encounter (Signed)
Rx has been sent to the pharmacy electronically. ° °

## 2020-12-16 DIAGNOSIS — H349 Unspecified retinal vascular occlusion: Secondary | ICD-10-CM | POA: Diagnosis not present

## 2020-12-16 DIAGNOSIS — H401133 Primary open-angle glaucoma, bilateral, severe stage: Secondary | ICD-10-CM | POA: Diagnosis not present

## 2020-12-18 ENCOUNTER — Other Ambulatory Visit: Payer: Self-pay

## 2020-12-18 ENCOUNTER — Encounter: Payer: Self-pay | Admitting: Family Medicine

## 2020-12-18 ENCOUNTER — Telehealth (INDEPENDENT_AMBULATORY_CARE_PROVIDER_SITE_OTHER): Payer: Medicare Other | Admitting: Family Medicine

## 2020-12-18 VITALS — Temp 98.0°F | Wt 108.0 lb

## 2020-12-18 DIAGNOSIS — Z87440 Personal history of urinary (tract) infections: Secondary | ICD-10-CM

## 2020-12-18 DIAGNOSIS — R109 Unspecified abdominal pain: Secondary | ICD-10-CM

## 2020-12-18 NOTE — Progress Notes (Signed)
   Subjective:    Patient ID: Julie Bass, female    DOB: [birth year], 87 y.o.   MRN: [id number]  HPI I connected with  Julie Bass on 12/18/20 by a video enabled telemedicine application and verified that I am speaking with the correct person using two identifiers.  Caregility used.  Me: Office.  Care was discussed with patient's daughter, [name] who is her main caregiver. I discussed the limitations of evaluation and management by telemedicine. The patient expressed understanding and agreed to proceed. She apparently is now complaining of some flank/back pain that does get worse with motion.  The daughter admits to mom and not sit in the proper posturing.  She has given her ibuprofen which apparently has helped.  On Tuesday some blood was seen in her diaper area and her daughter started her on Bactrim in anticipation of this being a UTI.  Normally when she has a UTI she does get "squarely" which is usually a sign of her UTI.   Review of Systems     Objective:   Physical Exam Patient not seen       Assessment & Plan:  History of UTI  Flank pain Recommend she continue on the Bactrim to ensure that this is not a UTI.  If the bleeding continues, she will need to be seen.  Her daughter is comfortable with that.  Discussed proper posturing for her mother and to use Tylenol and if no improvement can then use ibuprofen.  She expressed understanding of this. 22 minutes spent in counseling and coordination of care.

## 2020-12-25 ENCOUNTER — Encounter: Payer: Self-pay | Admitting: Family Medicine

## 2020-12-25 ENCOUNTER — Ambulatory Visit
Admission: RE | Admit: 2020-12-25 | Discharge: 2020-12-25 | Disposition: A | Discharge status: Home / Self Care | Payer: Medicare Other | Source: Ambulatory Visit | Attending: Family Medicine | Admitting: Family Medicine

## 2020-12-25 ENCOUNTER — Ambulatory Visit (INDEPENDENT_AMBULATORY_CARE_PROVIDER_SITE_OTHER): Payer: Medicare Other | Admitting: Family Medicine

## 2020-12-25 ENCOUNTER — Other Ambulatory Visit: Payer: Self-pay

## 2020-12-25 VITALS — BP 200/110 | HR 73 | Temp 98.5°F | Wt 104.2 lb

## 2020-12-25 DIAGNOSIS — Z87440 Personal history of urinary (tract) infections: Secondary | ICD-10-CM | POA: Diagnosis not present

## 2020-12-25 DIAGNOSIS — R5381 Other malaise: Secondary | ICD-10-CM

## 2020-12-25 DIAGNOSIS — M5136 Other intervertebral disc degeneration, lumbar region: Secondary | ICD-10-CM | POA: Diagnosis not present

## 2020-12-25 DIAGNOSIS — M48061 Spinal stenosis, lumbar region without neurogenic claudication: Secondary | ICD-10-CM | POA: Diagnosis not present

## 2020-12-25 DIAGNOSIS — M545 Low back pain, unspecified: Secondary | ICD-10-CM | POA: Diagnosis not present

## 2020-12-25 NOTE — Progress Notes (Signed)
   Subjective:    Patient ID: Julie Bass, female    DOB: 05-04-1926, 87 y.o.   MRN: [id number]  HPI She is brought in by her son-in-law for evaluation of a 1 week history of low to mid back pain.  No history of injury.  She has essentially been sitting around a lot and has become deconditioned.  She has a history of complaining of back pain but no previous x-rays per no frequency or dysuria.  She does have a recent history of UTI.   Review of Systems     Objective:   Physical Exam Alert and complaining of mid to low back pain.  She does have some tenderness palpation over the lower thoracic and upper lumbar area. Urine dipstick is negative.      Assessment & Plan:  Physical deconditioning - Plan: DG Lumbar Spine Complete  Acute bilateral low back pain without sciatica - Plan: DG Lumbar Spine Complete  History of UTI No present evidence of UTI.  We will evaluate her for possible compression fractures.  May possibly need referral to orthopedics.

## 2020-12-28 LAB — CUP PACEART REMOTE DEVICE CHECK
Date Time Interrogation Session: 20220212072159
Implantable Lead Implant Date: 20190225
Implantable Lead Implant Date: 20190225
Implantable Lead Location: 753859
Implantable Lead Location: 753860
Implantable Lead Model: 377
Implantable Lead Model: 377
Implantable Lead Serial Number: 80514208
Implantable Lead Serial Number: 80598782
Implantable Pulse Generator Implant Date: 20190225
Pulse Gen Model: 407145
Pulse Gen Serial Number: 69245949

## 2020-12-29 ENCOUNTER — Ambulatory Visit (INDEPENDENT_AMBULATORY_CARE_PROVIDER_SITE_OTHER): Payer: Medicare Other

## 2020-12-29 DIAGNOSIS — I442 Atrioventricular block, complete: Secondary | ICD-10-CM

## 2020-12-30 ENCOUNTER — Telehealth: Payer: Self-pay | Admitting: *Deleted

## 2020-12-30 NOTE — Telephone Encounter (Signed)
Patient scheduled to see Dr Alinda Money @ [facility] for back pain and kidney stones.

## 2020-12-31 ENCOUNTER — Other Ambulatory Visit: Payer: Self-pay | Admitting: Cardiovascular Disease

## 2020-12-31 DIAGNOSIS — M545 Low back pain, unspecified: Secondary | ICD-10-CM | POA: Diagnosis not present

## 2020-12-31 DIAGNOSIS — N200 Calculus of kidney: Secondary | ICD-10-CM | POA: Diagnosis not present

## 2021-01-05 NOTE — Progress Notes (Signed)
Remote pacemaker transmission.   

## 2021-01-23 ENCOUNTER — Telehealth: Payer: Self-pay | Admitting: Family Medicine

## 2021-01-23 MED ORDER — CITALOPRAM HYDROBROMIDE 20 MG PO TABS: 20.0000 mg | ORAL_TABLET | Freq: Every day | ORAL | 3 refills | Status: DC

## 2021-01-23 NOTE — Telephone Encounter (Signed)
[name] ( daughter) called requesting rx for patient She states that patient is crying all of the time and is scared of everything. She spent the weekend with [name] and would not let her out of her sight, slept with her and wanted her with her constantly. She is on citalopram but daughter feels it may not be working. Her grandson, JR, who has been living with her is getting married and moving out and that has her very upset.  She does have another relative staying with her.

## 2021-01-23 NOTE — Telephone Encounter (Signed)
She is having crying spells, Julie Bass is moving out. Disruption of her routine. I will increase her Celea

## 2021-02-03 DIAGNOSIS — H401133 Primary open-angle glaucoma, bilateral, severe stage: Secondary | ICD-10-CM | POA: Diagnosis not present

## 2021-02-03 DIAGNOSIS — H349 Unspecified retinal vascular occlusion: Secondary | ICD-10-CM | POA: Diagnosis not present

## 2021-02-11 ENCOUNTER — Other Ambulatory Visit: Payer: Self-pay | Admitting: Family Medicine

## 2021-02-11 MED ORDER — SULFAMETHOXAZOLE-TRIMETHOPRIM 800-160 MG PO TABS: 1.0000 | ORAL_TABLET | Freq: Two times a day (BID) | ORAL | 0 refills | Status: DC

## 2021-02-16 ENCOUNTER — Encounter: Payer: Self-pay | Admitting: Family Medicine

## 2021-02-16 MED ORDER — MIRTAZAPINE 15 MG PO TBDP: 15.0000 mg | ORAL_TABLET | Freq: Every day | ORAL | 0 refills | Status: DC

## 2021-02-18 ENCOUNTER — Other Ambulatory Visit: Payer: Self-pay | Admitting: Family Medicine

## 2021-02-18 ENCOUNTER — Other Ambulatory Visit: Payer: Self-pay | Admitting: Cardiovascular Disease

## 2021-02-18 NOTE — Telephone Encounter (Signed)
[facility] requesting to fill pt celexa for 90 days please advise [facility]

## 2021-02-19 NOTE — Telephone Encounter (Signed)
[facility] Please advise [facility]

## 2021-02-20 ENCOUNTER — Emergency Department (HOSPITAL_BASED_OUTPATIENT_CLINIC_OR_DEPARTMENT_OTHER): Payer: Medicare Other

## 2021-02-20 ENCOUNTER — Other Ambulatory Visit: Payer: Self-pay

## 2021-02-20 ENCOUNTER — Emergency Department (HOSPITAL_BASED_OUTPATIENT_CLINIC_OR_DEPARTMENT_OTHER)
Admission: EM | Admit: 2021-02-20 | Discharge: 2021-02-20 | Disposition: A | Discharge status: Home / Self Care | Payer: Medicare Other | Attending: Emergency Medicine | Admitting: Emergency Medicine

## 2021-02-20 ENCOUNTER — Encounter (HOSPITAL_BASED_OUTPATIENT_CLINIC_OR_DEPARTMENT_OTHER): Payer: Self-pay

## 2021-02-20 DIAGNOSIS — M545 Low back pain, unspecified: Secondary | ICD-10-CM | POA: Diagnosis not present

## 2021-02-20 DIAGNOSIS — W01198A Fall on same level from slipping, tripping and stumbling with subsequent striking against other object, initial encounter: Secondary | ICD-10-CM | POA: Insufficient documentation

## 2021-02-20 DIAGNOSIS — Z79899 Other long term (current) drug therapy: Secondary | ICD-10-CM | POA: Insufficient documentation

## 2021-02-20 DIAGNOSIS — S345XXA Injury of lumbar, sacral and pelvic sympathetic nerves, initial encounter: Secondary | ICD-10-CM | POA: Diagnosis present

## 2021-02-20 DIAGNOSIS — Y92002 Bathroom of unspecified non-institutional (private) residence single-family (private) house as the place of occurrence of the external cause: Secondary | ICD-10-CM | POA: Insufficient documentation

## 2021-02-20 DIAGNOSIS — F0390 Unspecified dementia without behavioral disturbance: Secondary | ICD-10-CM | POA: Insufficient documentation

## 2021-02-20 DIAGNOSIS — S0003XA Contusion of scalp, initial encounter: Secondary | ICD-10-CM | POA: Insufficient documentation

## 2021-02-20 DIAGNOSIS — S32030A Wedge compression fracture of third lumbar vertebra, initial encounter for closed fracture: Secondary | ICD-10-CM | POA: Diagnosis not present

## 2021-02-20 DIAGNOSIS — S32010A Wedge compression fracture of first lumbar vertebra, initial encounter for closed fracture: Secondary | ICD-10-CM | POA: Diagnosis not present

## 2021-02-20 DIAGNOSIS — I129 Hypertensive chronic kidney disease with stage 1 through stage 4 chronic kidney disease, or unspecified chronic kidney disease: Secondary | ICD-10-CM | POA: Insufficient documentation

## 2021-02-20 DIAGNOSIS — S32000A Wedge compression fracture of unspecified lumbar vertebra, initial encounter for closed fracture: Secondary | ICD-10-CM

## 2021-02-20 DIAGNOSIS — Z950 Presence of cardiac pacemaker: Secondary | ICD-10-CM | POA: Insufficient documentation

## 2021-02-20 DIAGNOSIS — S0990XA Unspecified injury of head, initial encounter: Secondary | ICD-10-CM | POA: Diagnosis not present

## 2021-02-20 DIAGNOSIS — N1831 Chronic kidney disease, stage 3a: Secondary | ICD-10-CM | POA: Diagnosis not present

## 2021-02-20 DIAGNOSIS — Z87891 Personal history of nicotine dependence: Secondary | ICD-10-CM | POA: Insufficient documentation

## 2021-02-20 DIAGNOSIS — Z96611 Presence of right artificial shoulder joint: Secondary | ICD-10-CM | POA: Diagnosis not present

## 2021-02-20 DIAGNOSIS — Z043 Encounter for examination and observation following other accident: Secondary | ICD-10-CM | POA: Diagnosis not present

## 2021-02-20 DIAGNOSIS — S32020A Wedge compression fracture of second lumbar vertebra, initial encounter for closed fracture: Secondary | ICD-10-CM | POA: Insufficient documentation

## 2021-02-20 DIAGNOSIS — W19XXXA Unspecified fall, initial encounter: Secondary | ICD-10-CM

## 2021-02-20 MED ORDER — HYDROCODONE-ACETAMINOPHEN 5-325 MG PO TABS: 1.0000 | ORAL_TABLET | ORAL | 0 refills | Status: DC | PRN

## 2021-02-20 MED ORDER — HYDROCODONE-ACETAMINOPHEN 5-325 MG PO TABS
1.0000 | ORAL_TABLET | Freq: Once | ORAL | Status: AC
  Administered 2021-02-20: 1 via ORAL
  Filled 2021-02-20: qty 1

## 2021-02-20 MED ORDER — LIDOCAINE 5 % EX PTCH: 1.0000 | MEDICATED_PATCH | CUTANEOUS | 0 refills | Status: DC

## 2021-02-20 NOTE — ED Triage Notes (Signed)
Pt's daughter reports that the Pt had a unwitnessed fall backwards and hit her head. No LOC. Denies any thinner.

## 2021-02-20 NOTE — ED Provider Notes (Signed)
[facility] EMERGENCY DEPT Provider Note   CSN: [id number] Arrival date & time: 02/20/21  1832     History Chief Complaint  Patient presents with  . Fall    Julie Bass is a 87 y.o. female.  Patient is an 87 year old female patient who presents after a fall.  She has a history of dementia so history is limited.  She is brought in by family member who states that she lives at home but has someone who stays with her all the time.  She fell backward while she was in the bathroom and hit a table.  This was unwitnessed although she has a history of falls in the past which is not very unusual for her.  She did not have any loss of consciousness.  She has a hematoma to the back of her head.  She is not on any anticoagulants.  She does complain of some pain in her low back.  This is been going on for several months although the family member says it seems to be worse over the last couple days.  Not sure if it is related to the fall or not.  She otherwise has been acting normally and at baseline.  No increased confusion.  No fevers.  No vomiting.  No other recent illnesses.        Past Medical History:  Diagnosis Date  . Arthritis   . Chronic kidney disease    KIDNEY STONES  . Dementia ([place])    BEGINNING STAGES   . Depression   . GERD (gastroesophageal reflux disease)   . Glaucoma   . Hiatal hernia   . HTN (hypertension) 10/18/2011  . Hyperlipemia 10/18/2011  . NSVT (nonsustained ventricular tachycardia) ([place]) 10/18/2011  . Osteoporosis   . PAD (peripheral artery disease) ([place]) 10/18/2011   h/o left SFA stent    Patient Active Problem List   Diagnosis Date Noted  . Acute metabolic encephalopathy 32/20/2542  . Lactic acidosis 05/29/2020  . Hypertensive urgency 05/29/2020  . Hiatal hernia 09/18/2019  . Peripheral artery disease ([place]) 09/18/2019  . Acute renal failure superimposed on stage 3a chronic kidney disease ([place]) 09/18/2019  . Essential hypertension  09/18/2019  . Neck mass 09/18/2019  . Weakness 09/17/2019  . Pacemaker 05/28/2019  . Bilateral sensorineural hearing loss 05/09/2018  . CHB (complete heart block) ([place]) 01/20/2018  . OSA (obstructive sleep apnea) 01/20/2018  . 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 GERD without esophagitis 01/08/2018  . CKD (chronic kidney disease), stage III 01/07/2018  . Lower urinary tract infectious disease 06/16/2016  . Warthin's tumor 01/01/2016  . Dementia ([place]) 03/14/2015  . Orthostatic hypotension 08/27/2014  . Physical deconditioning 08/27/2014  . Abdominal pain in female 08/27/2014  . Osteoarthrosis, unspecified whether generalized or localized, shoulder region 01/26/2013  . Chest wall pain 12/26/2012  . NSVT (nonsustained ventricular tachycardia) ([place]) 10/18/2011  . HTN (hypertension) 10/18/2011  . HLD (hyperlipidemia) 10/18/2011  . PAD (peripheral artery disease) ([place]) 10/18/2011  . Syncope 09/30/2011  . Pelvic joint pain 09/30/2011  . Back pain 09/30/2011    Past Surgical History:  Procedure Laterality Date  . ABDOMINAL HYSTERECTOMY  1973  . CARDIAC CATHETERIZATION     2012  . CHOLECYSTECTOMY    . LOOP RECORDER IMPLANT N/A 08/27/2014   Procedure: LOOP RECORDER IMPLANT;  Surgeon: Coralyn Mark, MD;  Location: [facility];  Service: Cardiovascular;  Laterality: N/A;  . LOOP RECORDER REMOVAL N/A 01/09/2018   Procedure: LOOP RECORDER REMOVAL;  Surgeon: Lovena Le,  Champ Mungo, MD;  Location: [facility];  Service: Cardiovascular;  Laterality: N/A;  . LOWER EXTREMITY ANGIOGRAM  11/02/2007   stent of mid left SFA with 6x113mm EV3 self-expanding stent and 6x3 in prox region (Dr. Adora Fridge)  . LOWER EXTREMITY ANGIOGRAM    . LOWER EXTREMITY ARTERIAL DOPPLER  2013   right SFA with 50-69% diameter reduction, right PTA/peroneal occluded, L SFA prox to stent has narrowing with increased velocities >60% diameter reduction, L SFA stent patent, L ATA w/occlusive disease, bilat ABIs show mild arterial  insuffiency at rest  . NM MYOCAR PERF WALL MOTION  10/2011   non-gated - normal stuy, EF 82%, normal LV wall motion  . PACEMAKER IMPLANT N/A 01/09/2018   Procedure: PACEMAKER IMPLANT;  Surgeon: Evans Lance, MD;  Location: [facility];  Service: Cardiovascular;  Laterality: N/A;  . REVERSE SHOULDER ARTHROPLASTY Right 01/26/2013   Procedure: RIGHT REVERSE TOTAL SHOULDER ARTHROPLASTY;  Surgeon: Augustin Schooling, MD;  Location: [facility];  Service: Orthopedics;  Laterality: Right;  . TRANSTHORACIC ECHOCARDIOGRAM  10/2012   EF 75-70%, mod conc hypertrophy, severely calcified MV annulus, LA mildly dailted, PA peak pressure 23mmHg     OB History   No obstetric history on file.     Family History  Problem Relation Age of Onset  . Throat cancer Father   . Cancer Mother   . CAD Son   . Breast cancer Sister        cervical cancer  . Colon cancer Brother        throat cancer  . Pulmonary embolism Daughter   . Hypertension Daughter     Social History   Tobacco Use  . Smoking status: Former Smoker    Quit date: 05/07/1984    Years since quitting: 36.8  . Smokeless tobacco: Never Used  Substance Use Topics  . Alcohol use: No  . Drug use: No    Home Medications Prior to Admission medications   Medication Sig Start Date End Date Taking? Authorizing Provider  citalopram (CELEXA) 20 MG tablet TAKE 1 TABLET BY MOUTH EVERY DAY 02/18/21   Denita Lung, MD  HYDROcodone-acetaminophen (NORCO/VICODIN) 5-325 MG tablet Take 1 tablet by mouth every 4 (four) hours as needed. 02/20/21  Yes Malvin Johns, MD  lidocaine (LIDODERM) 5 % Place 1 patch onto the skin daily. Remove & Discard patch within 12 hours or as directed by MD 02/20/21  Yes Malvin Johns, MD  amLODipine (NORVASC) 2.5 MG tablet TAKE 1 TABLET (2.5 MG TOTAL) BY MOUTH EVERY EVENING. 12/11/20   Troy Sine, MD  cilostazol (PLETAL) 50 MG tablet TAKE 1 TABLET BY MOUTH TWICE A DAY 02/20/21   Croitoru, Mihai, MD  CRANBERRY PO Take 1  capsule by mouth daily.    [provider]  ezetimibe (ZETIA) 10 MG tablet TAKE 1 TABLET BY MOUTH EVERY DAY 09/16/20   Troy Sine, MD  feeding supplement, ENSURE COMPLETE, (ENSURE COMPLETE) LIQD Take 237 mLs by mouth 3 (three) times daily between meals. Patient taking differently: Take 237 mLs by mouth See admin instructions. Once to twice daily 08/27/14   Barrett, Evelene Croon, PA-C  geriatric multivitamins-minerals (ELDERTONIC/GEVRABON) LIQD Take 15 mLs by mouth daily.    [provider]  LIVALO 2 MG TABS TAKE 1 TABLET BY MOUTH EVERY DAY 01/01/21   Troy Sine, MD  metoprolol succinate (TOPROL-XL) 100 MG 24 hr tablet Take 1 tablet (100 mg total) by mouth every evening. 09/11/20  Troy Sine, MD  metoprolol succinate (TOPROL-XL) 25 MG 24 hr tablet Take 1 tablet (25 mg total) by mouth in the morning. Take with or immediately following a meal. 09/11/20 12/10/20  Troy Sine, MD  mirtazapine (REMERON SOL-TAB) 15 MG disintegrating tablet Take 1 tablet (15 mg total) by mouth at bedtime. 02/16/21   Denita Lung, MD  omega-3 acid ethyl esters (LOVAZA) 1 g capsule TAKE 1 CAPSULE (1 G TOTAL) BY MOUTH EVERY EVENING. 01/01/21   Troy Sine, MD  pantoprazole (PROTONIX) 40 MG tablet Take 1 tablet (40 mg total) by mouth daily. Patient taking differently: Take 40 mg by mouth daily as needed (for acid reflux). 06/18/19   Troy Sine, MD  sulfamethoxazole-trimethoprim (BACTRIM DS) 800-160 MG tablet Take 1 tablet by mouth 2 (two) times daily. 02/11/21   Denita Lung, MD  timolol (TIMOPTIC) 0.5 % ophthalmic solution Place 1 drop into both eyes 2 (two) times daily.     [provider]    Allergies    Atorvastatin calcium, Crestor [rosuvastatin calcium], Lipitor [atorvastatin calcium], and Rosuvastatin calcium  Review of Systems   Review of Systems  Unable to perform ROS: Dementia    Physical Exam Updated Vital Signs BP (!) 189/91   Pulse 68   Temp 99.1 F (37.3  C) (Oral)   Resp 18   Ht 5\' 4"  (1.626 m)   Wt 48.7 kg   SpO2 100%   BMI 18.42 kg/m   Physical Exam Constitutional:      Appearance: She is well-developed.  HENT:     Head: Normocephalic.     Comments: Small hematoma to the posterior scalp Eyes:     Pupils: Pupils are equal, round, and reactive to light.  Neck:     Comments: No pain along the cervical or thoracic spine.  There is some tenderness to the mid and lower lumbosacral spine, no step-offs or deformities Cardiovascular:     Rate and Rhythm: Normal rate and regular rhythm.     Heart sounds: Normal heart sounds.  Pulmonary:     Effort: Pulmonary effort is normal. No respiratory distress.     Breath sounds: Normal breath sounds. No wheezing or rales.     Comments: No tenderness along the ribs Chest:     Chest wall: No tenderness.  Abdominal:     General: Bowel sounds are normal.     Palpations: Abdomen is soft.     Tenderness: There is no abdominal tenderness. There is no guarding or rebound.  Musculoskeletal:        General: Normal range of motion.     Comments: No pain on palpation or range of motion of the extremities, including the hips  Lymphadenopathy:     Cervical: No cervical adenopathy.  Skin:    General: Skin is warm and dry.     Findings: No rash.  Neurological:     Mental Status: She is alert.     Comments: Oriented to person, moves all extremities symmetrically without focal deficits     ED Results / Procedures / Treatments   Labs (all labs ordered are listed, but only abnormal results are displayed) Labs Reviewed - No data to display  EKG None  Radiology CT Head Wo Contrast  Result Date: 02/20/2021 CLINICAL DATA:  Unwitnessed fall EXAM: CT HEAD WITHOUT CONTRAST TECHNIQUE: Contiguous axial images were obtained from the base of the skull through the vertex without intravenous contrast. COMPARISON:  CT brain 05/29/2020 FINDINGS:  Brain: No acute territorial infarction, hemorrhage, or intracranial  mass. Advanced atrophy. Moderate hypodensity in the white matter consistent with chronic small vessel ischemic change. Encephalomalacia within the right parasagittal frontal lobe as before. Stable ventricle size. Vascular: No hyperdense vessels. Vertebral and carotid vascular calcification Skull: Normal. Negative for fracture or focal lesion. Sinuses/Orbits: No acute finding. Other: None IMPRESSION: 1. No CT evidence for acute intracranial abnormality. 2. Atrophy and chronic small vessel ischemic changes of the white matter. Electronically Signed   By: Donavan Foil M.D.   On: 02/20/2021 20:39   CT Cervical Spine Wo Contrast  Result Date: 02/20/2021 CLINICAL DATA:  Fall EXAM: CT CERVICAL SPINE WITHOUT CONTRAST TECHNIQUE: Multidetector CT imaging of the cervical spine was performed without intravenous contrast. Multiplanar CT image reconstructions were also generated. COMPARISON:  None. FINDINGS: Alignment: Trace anterolisthesis C3 on C4, and C4 on C5. Reversal of cervical lordosis. Facet alignment is maintained Skull base and vertebrae: Vertebral body heights are maintained. Age indeterminate mild superior endplate deformity T3. Soft tissues and spinal canal: No prevertebral fluid or swelling. No visible canal hematoma. Disc levels: Multiple level degenerative change with moderate disc space narrowing and degenerative change at C4-C5 and C6-C7. Facet degenerative changes at multiple levels. Upper chest: Apical blebs Other: None IMPRESSION: 1. Reversal of cervical lordosis with trace anterolisthesis C3 on C4, and C4 on C5, likely degenerative. No acute osseous abnormality of the cervical spine. 2. Age indeterminate mild superior endplate deformity T3. Electronically Signed   By: Donavan Foil M.D.   On: 02/20/2021 20:51   CT Lumbar Spine Wo Contrast  Result Date: 02/20/2021 CLINICAL DATA:  Fall with low back pain EXAM: CT LUMBAR SPINE WITHOUT CONTRAST TECHNIQUE: Multidetector CT imaging of the lumbar spine was  performed without intravenous contrast administration. Multiplanar CT image reconstructions were also generated. COMPARISON:  Radiograph 12/25/2020 FINDINGS: Segmentation: 5 lumbar type vertebrae. Alignment: Normal. Vertebrae: Chronic moderate compression fracture at L1. Subacute appearing superior endplate fractures at L2 and L3, new since radiographs from February. Slight retropulsion of superior aspects of the posterior vertebral bodies at L2 and L3 but no significant canal stenosis. Paraspinal and other soft tissues: Extensive vascular calcification. Intrarenal vascular calcification and possible stones. Disc levels: At T12-L1, maintained disc space. No significant canal stenosis. The foramen are patent bilaterally. At L1-L2, maintained disc space. Small central posterior disc osteophyte complex. No high-grade canal stenosis. Foramen grossly patent. There are facet degenerative changes. At L2-L3, maintained disc space. No significant canal stenosis. Facet degenerative changes. Foramen are grossly patent. At L3 L4, maintained disc space. Mild diffuse disc bulge without high-grade canal stenosis. Facet degenerative changes with ligamentum flavum thickening and calcification. Mild left foraminal narrowing. At L4-L5, disc space narrowing with vacuum disc. Mild diffuse disc bulge without significant canal stenosis. Facet degenerative change with ligamentum flavum thickening and calcification. Mild to moderate foraminal narrowing bilaterally. At L5-S1, disc space narrowing and vacuum discs. Diffuse broad-based disc bulge without high-grade canal stenosis. Ligamentum flavum thickening and calcification with facet degenerative changes. There is mild bilateral foraminal narrowing. IMPRESSION: 1. Chronic moderate compression fracture at L1. Subacute appearing superior endplate fractures at L2 and L3, new since radiographs from February. Slight retropulsion of superior aspects of the posterior vertebral bodies at L2 and L3  but without significant canal stenosis. 2. Multilevel degenerative changes as described above. Electronically Signed   By: Donavan Foil M.D.   On: 02/20/2021 21:03    Procedures Procedures   Medications Ordered in ED Medications  HYDROcodone-acetaminophen (  NORCO/VICODIN) 5-325 MG per tablet 1 tablet (has no administration in time range)    ED Course  I have reviewed the triage vital signs and the nursing notes.  Pertinent labs & imaging results that were available during my care of the patient were reviewed by me and considered in my medical decision making (see chart for details).    MDM Rules/Calculators/A&P                          Patient presents after a fall.  She does not have any change in her mental status or other apparent acute illnesses.  She has been having some worsening back pain that does not seem to be related to this fall.  She had a CT scan of her head and cervical spine which showed no acute abnormalities.  CT scan of her lumbar spine shows a chronic compression fracture of L1 and some subacute appearing endplate fractures of L2 and L3.  This could be etiology of her pain.  She was given dose of Vicodin.  She will be started on Lidoderm patches and Vicodin only at night per her family members.  Will follow up with her PCP.  Return precautions were given.  She is neurologically intact.  Her blood pressure is elevated but she has not taken her blood pressure medicine tonight. Final Clinical Impression(s) / ED Diagnoses Final diagnoses:  Fall, initial encounter  Lumbar compression fracture, closed, initial encounter [facility])    Rx / DC Orders ED Discharge Orders         Ordered    lidocaine (LIDODERM) 5 %  Every 24 hours        02/20/21 2138    HYDROcodone-acetaminophen (NORCO/VICODIN) 5-325 MG tablet  Every 4 hours PRN        02/20/21 2138           Malvin Johns, MD 02/20/21 2140

## 2021-03-14 ENCOUNTER — Other Ambulatory Visit: Payer: Self-pay | Admitting: Family Medicine

## 2021-03-16 NOTE — Telephone Encounter (Signed)
Pt. Requesting refill on mirtazapine pt.last apt was 12/25/20

## 2021-03-19 ENCOUNTER — Ambulatory Visit (HOSPITAL_COMMUNITY)
Admission: EM | Admit: 2021-03-19 | Discharge: 2021-03-19 | Disposition: A | Discharge status: Home / Self Care | Payer: Medicare Other | Attending: Internal Medicine | Admitting: Internal Medicine

## 2021-03-19 ENCOUNTER — Encounter (HOSPITAL_COMMUNITY): Payer: Self-pay

## 2021-03-19 DIAGNOSIS — K2900 Acute gastritis without bleeding: Secondary | ICD-10-CM | POA: Diagnosis not present

## 2021-03-19 NOTE — ED Triage Notes (Signed)
Pt c/o abdominal pain x 2 days. Pt caregiver states she recently had a UTI. She states she has taken medication. Pt states she does not feel nauseas.

## 2021-03-19 NOTE — Discharge Instructions (Signed)
Take Mylanta as needed Complete antibiotics If symptoms worsen ,please return to the urgent care to be re-evaluated.

## 2021-03-22 NOTE — ED Provider Notes (Signed)
[facility]    CSN: [id number] Arrival date & time: 03/19/21  7741      History   Chief Complaint Chief Complaint  Patient presents with  . Abdominal Pain    HPI Julie Bass is a 87 y.o. female is brought to the urgent care with a 2-day history of abdominal pain.  Patient was recently treated with Bactrim for urinary tract infection.  She denies any dysuria, urgency or frequency.  No diarrhea.  Pain is difficult to describe.  The patient.  It appears to be mainly in the epigastric area.  She was given a dose of Mylanta which resulted resolution of the pain.  Currently patient does not have any abdominal pain no abdominal distention.  No nausea or vomiting.  She was accompanied by her caretaker who confirmed that the patient is back to her baseline.  She brought the patient to the urgent care for her to be evaluated.  HPI  Past Medical History:  Diagnosis Date  . Arthritis   . Chronic kidney disease    KIDNEY STONES  . Dementia ([place])    BEGINNING STAGES   . Depression   . GERD (gastroesophageal reflux disease)   . Glaucoma   . Hiatal hernia   . HTN (hypertension) 10/18/2011  . Hyperlipemia 10/18/2011  . NSVT (nonsustained ventricular tachycardia) ([place]) 10/18/2011  . Osteoporosis   . PAD (peripheral artery disease) ([place]) 10/18/2011   h/o left SFA stent    Patient Active Problem List   Diagnosis Date Noted  . Acute metabolic encephalopathy 28/78/6767  . Lactic acidosis 05/29/2020  . Hypertensive urgency 05/29/2020  . Hiatal hernia 09/18/2019  . Peripheral artery disease ([place]) 09/18/2019  . Acute renal failure superimposed on stage 3a chronic kidney disease ([place]) 09/18/2019  . Essential hypertension 09/18/2019  . Neck mass 09/18/2019  . Weakness 09/17/2019  . Pacemaker 05/28/2019  . Bilateral sensorineural hearing loss 05/09/2018  . CHB (complete heart block) ([place]) 01/20/2018  . OSA (obstructive sleep apnea) 01/20/2018  . 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 GERD without esophagitis 01/08/2018  . CKD (chronic kidney disease), stage III 01/07/2018  . Lower urinary tract infectious disease 06/16/2016  . Warthin's tumor 01/01/2016  . Dementia ([place]) 03/14/2015  . Orthostatic hypotension 08/27/2014  . Physical deconditioning 08/27/2014  . Abdominal pain in female 08/27/2014  . Osteoarthrosis, unspecified whether generalized or localized, shoulder region 01/26/2013  . Chest wall pain 12/26/2012  . NSVT (nonsustained ventricular tachycardia) ([place]) 10/18/2011  . HTN (hypertension) 10/18/2011  . HLD (hyperlipidemia) 10/18/2011  . PAD (peripheral artery disease) ([place]) 10/18/2011  . Syncope 09/30/2011  . Pelvic joint pain 09/30/2011  . Back pain 09/30/2011    Past Surgical History:  Procedure Laterality Date  . ABDOMINAL HYSTERECTOMY  1973  . CARDIAC CATHETERIZATION     2012  . CHOLECYSTECTOMY    . LOOP RECORDER IMPLANT N/A 08/27/2014   Procedure: LOOP RECORDER IMPLANT;  Surgeon: Coralyn Mark, MD;  Location: [facility] CATH [facility];  Service: Cardiovascular;  Laterality: N/A;  . LOOP RECORDER REMOVAL N/A 01/09/2018   Procedure: LOOP RECORDER REMOVAL;  Surgeon: Evans Lance, MD;  Location: [facility] CV [facility];  Service: Cardiovascular;  Laterality: N/A;  . LOWER EXTREMITY ANGIOGRAM  11/02/2007   stent of mid left SFA with 6x147mm EV3 self-expanding stent and 6x3 in prox region (Dr. Adora Fridge)  . LOWER EXTREMITY ANGIOGRAM    . LOWER EXTREMITY ARTERIAL DOPPLER  2013   right SFA with 50-69% diameter reduction,  right PTA/peroneal occluded, L SFA prox to stent has narrowing with increased velocities >60% diameter reduction, L SFA stent patent, L ATA w/occlusive disease, bilat ABIs show mild arterial insuffiency at rest  . NM MYOCAR PERF WALL MOTION  10/2011   non-gated - normal stuy, EF 82%, normal LV wall motion  . PACEMAKER IMPLANT N/A 01/09/2018   Procedure: PACEMAKER IMPLANT;  Surgeon: Evans Lance, MD;  Location: [facility];  Service:  Cardiovascular;  Laterality: N/A;  . REVERSE SHOULDER ARTHROPLASTY Right 01/26/2013   Procedure: RIGHT REVERSE TOTAL SHOULDER ARTHROPLASTY;  Surgeon: Augustin Schooling, MD;  Location: [facility];  Service: Orthopedics;  Laterality: Right;  . TRANSTHORACIC ECHOCARDIOGRAM  10/2012   EF 75-70%, mod conc hypertrophy, severely calcified MV annulus, LA mildly dailted, PA peak pressure 13mmHg    OB History   No obstetric history on file.      Home Medications    Prior to Admission medications   Medication Sig Start Date End Date Taking? Authorizing Provider  citalopram (CELEXA) 20 MG tablet TAKE 1 TABLET BY MOUTH EVERY DAY 02/18/21   Denita Lung, MD  mirtazapine (REMERON SOL-TAB) 15 MG disintegrating tablet TAKE 1 TABLET BY MOUTH EVERYDAY AT BEDTIME 03/16/21   Denita Lung, MD  amLODipine (NORVASC) 2.5 MG tablet TAKE 1 TABLET (2.5 MG TOTAL) BY MOUTH EVERY EVENING. 12/11/20   Troy Sine, MD  cilostazol (PLETAL) 50 MG tablet TAKE 1 TABLET BY MOUTH TWICE A DAY 02/20/21   Croitoru, Mihai, MD  CRANBERRY PO Take 1 capsule by mouth daily.    [provider]  ezetimibe (ZETIA) 10 MG tablet TAKE 1 TABLET BY MOUTH EVERY DAY 09/16/20   Troy Sine, MD  feeding supplement, ENSURE COMPLETE, (ENSURE COMPLETE) LIQD Take 237 mLs by mouth 3 (three) times daily between meals. Patient taking differently: Take 237 mLs by mouth See admin instructions. Once to twice daily 08/27/14   Barrett, Evelene Croon, PA-C  geriatric multivitamins-minerals (ELDERTONIC/GEVRABON) LIQD Take 15 mLs by mouth daily.    [provider]  HYDROcodone-acetaminophen (NORCO/VICODIN) 5-325 MG tablet Take 1 tablet by mouth every 4 (four) hours as needed. 02/20/21   Malvin Johns, MD  lidocaine (LIDODERM) 5 % Place 1 patch onto the skin daily. Remove & Discard patch within 12 hours or as directed by MD 02/20/21   Malvin Johns, MD  LIVALO 2 MG TABS TAKE 1 TABLET BY MOUTH EVERY DAY 01/01/21   Troy Sine, MD  metoprolol succinate  (TOPROL-XL) 100 MG 24 hr tablet Take 1 tablet (100 mg total) by mouth every evening. 09/11/20   Troy Sine, MD  metoprolol succinate (TOPROL-XL) 25 MG 24 hr tablet Take 1 tablet (25 mg total) by mouth in the morning. Take with or immediately following a meal. 09/11/20 12/10/20  Troy Sine, MD  omega-3 acid ethyl esters (LOVAZA) 1 g capsule TAKE 1 CAPSULE (1 G TOTAL) BY MOUTH EVERY EVENING. 01/01/21   Troy Sine, MD  pantoprazole (PROTONIX) 40 MG tablet Take 1 tablet (40 mg total) by mouth daily. Patient taking differently: Take 40 mg by mouth daily as needed (for acid reflux). 06/18/19   Troy Sine, MD  sulfamethoxazole-trimethoprim (BACTRIM DS) 800-160 MG tablet Take 1 tablet by mouth 2 (two) times daily. 02/11/21   Denita Lung, MD  timolol (TIMOPTIC) 0.5 % ophthalmic solution Place 1 drop into both eyes 2 (two) times daily.     [provider]    [facility]  History Family History  Problem Relation Age of Onset  . Throat cancer Father   . Cancer Mother   . CAD Son   . Breast cancer Sister        cervical cancer  . Colon cancer Brother        throat cancer  . Pulmonary embolism Daughter   . Hypertension Daughter     Social History Social History   Tobacco Use  . Smoking status: Former Smoker    Quit date: 05/07/1984    Years since quitting: 36.8  . Smokeless tobacco: Never Used  Substance Use Topics  . Alcohol use: No  . Drug use: No     Allergies   Atorvastatin calcium, Crestor [rosuvastatin calcium], Lipitor [atorvastatin calcium], and Rosuvastatin calcium   Review of Systems Review of Systems  Unable to perform ROS: Age     Physical Exam Triage Vital Signs ED Triage Vitals  Enc Vitals Group     BP 03/19/21 1942 (!) 150/60     Pulse Rate 03/19/21 1942 69     Resp 03/19/21 1942 15     Temp 03/19/21 1942 99.2 F (37.3 C)     Temp Source 03/19/21 1942 Oral     SpO2 03/19/21 1942 97 %     Weight --      Height --      Head Circumference  --      Peak Flow --      Pain Score 03/19/21 1940 7     Pain Loc --      Pain Edu? --      Excl. in [place]? --    No data found.  Updated Vital Signs BP (!) 150/60 (BP Location: Left Arm)   Pulse 69   Temp 99.2 F (37.3 C) (Oral)   Resp 15   SpO2 97%   Visual Acuity Right Eye Distance:   Left Eye Distance:   Bilateral Distance:    Right Eye Near:   Left Eye Near:    Bilateral Near:     Physical Exam Vitals and nursing note reviewed.  Constitutional:      Appearance: She is well-developed.  Abdominal:     General: Abdomen is flat. Bowel sounds are normal. There is no distension.     Palpations: Abdomen is soft. There is no shifting dullness, fluid wave, hepatomegaly or splenomegaly.     Tenderness: There is no abdominal tenderness.     Hernia: No hernia is present.  Neurological:     Mental Status: She is alert.      UC Treatments / Results  Labs (all labs ordered are listed, but only abnormal results are displayed) Labs Reviewed - No data to display  EKG   Radiology No results found.  Procedures Procedures (including critical care time)  Medications Ordered in UC Medications - No data to display  Initial Impression / Assessment and Plan / UC Course  I have reviewed the triage vital signs and the nursing notes.  Pertinent labs & imaging results that were available during my care of the patient were reviewed by me and considered in my medical decision making (see chart for details).     1.  Acute gastritis without hemorrhage: Continue Mylanta use as needed Complete antibiotics Return precautions given Final Clinical Impressions(s) / UC Diagnoses   Final diagnoses:  Other acute gastritis without hemorrhage     Discharge Instructions     Take Mylanta as needed Complete antibiotics If symptoms worsen ,  please return to the urgent care to be re-evaluated.   ED Prescriptions    None     PDMP not reviewed this encounter.   Chase Picket, MD 03/22/21 [phone]

## 2021-03-23 ENCOUNTER — Other Ambulatory Visit: Payer: Self-pay

## 2021-03-23 ENCOUNTER — Emergency Department (HOSPITAL_COMMUNITY)
Admission: EM | Admit: 2021-03-23 | Discharge: 2021-03-24 | Disposition: A | Discharge status: Home / Self Care | Payer: Medicare Other | Attending: Emergency Medicine | Admitting: Emergency Medicine

## 2021-03-23 ENCOUNTER — Other Ambulatory Visit: Payer: Self-pay | Admitting: Family Medicine

## 2021-03-23 ENCOUNTER — Emergency Department (HOSPITAL_COMMUNITY): Payer: Medicare Other

## 2021-03-23 DIAGNOSIS — R531 Weakness: Secondary | ICD-10-CM | POA: Diagnosis not present

## 2021-03-23 DIAGNOSIS — F0390 Unspecified dementia without behavioral disturbance: Secondary | ICD-10-CM | POA: Diagnosis not present

## 2021-03-23 DIAGNOSIS — Z950 Presence of cardiac pacemaker: Secondary | ICD-10-CM | POA: Insufficient documentation

## 2021-03-23 DIAGNOSIS — N1831 Chronic kidney disease, stage 3a: Secondary | ICD-10-CM | POA: Insufficient documentation

## 2021-03-23 DIAGNOSIS — I129 Hypertensive chronic kidney disease with stage 1 through stage 4 chronic kidney disease, or unspecified chronic kidney disease: Secondary | ICD-10-CM | POA: Diagnosis not present

## 2021-03-23 DIAGNOSIS — Z79899 Other long term (current) drug therapy: Secondary | ICD-10-CM | POA: Insufficient documentation

## 2021-03-23 DIAGNOSIS — Z87891 Personal history of nicotine dependence: Secondary | ICD-10-CM | POA: Diagnosis not present

## 2021-03-23 DIAGNOSIS — R4182 Altered mental status, unspecified: Secondary | ICD-10-CM | POA: Insufficient documentation

## 2021-03-23 DIAGNOSIS — R410 Disorientation, unspecified: Secondary | ICD-10-CM

## 2021-03-23 LAB — COMPREHENSIVE METABOLIC PANEL
ALT: 32 U/L (ref 0–44)
AST: 31 U/L (ref 15–41)
Albumin: 3.9 g/dL (ref 3.5–5.0)
Alkaline Phosphatase: 53 U/L (ref 38–126)
Anion gap: 11 (ref 5–15)
BUN: 23 mg/dL (ref 8–23)
CO2: 21 mmol/L — ABNORMAL LOW (ref 22–32)
Calcium: 10.1 mg/dL (ref 8.9–10.3)
Chloride: 103 mmol/L (ref 98–111)
Creatinine, Ser: 1.49 mg/dL — ABNORMAL HIGH (ref 0.44–1.00)
GFR, Estimated: 33 mL/min — ABNORMAL LOW (ref >60)
Glucose, Bld: 184 mg/dL — ABNORMAL HIGH (ref 70–99)
Potassium: 3.6 mmol/L (ref 3.5–5.1)
Sodium: 135 mmol/L (ref 135–145)
Total Bilirubin: 1 mg/dL (ref 0.3–1.2)
Total Protein: 6.9 g/dL (ref 6.5–8.1)

## 2021-03-23 LAB — CBC WITH DIFFERENTIAL/PLATELET
Abs Immature Granulocytes: 0.02 10*3/uL (ref 0.00–0.07)
Basophils Absolute: 0 10*3/uL (ref 0.0–0.1)
Basophils Relative: 1 %
Eosinophils Absolute: 0.1 10*3/uL (ref 0.0–0.5)
Eosinophils Relative: 1 %
HCT: 42.7 % (ref 36.0–46.0)
Hemoglobin: 13.7 g/dL (ref 12.0–15.0)
Immature Granulocytes: 0 %
Lymphocytes Relative: 30 %
Lymphs Abs: 1.6 10*3/uL (ref 0.7–4.0)
MCH: 32.5 pg (ref 26.0–34.0)
MCHC: 32.1 g/dL (ref 30.0–36.0)
MCV: 101.2 fL — ABNORMAL HIGH (ref 80.0–100.0)
Monocytes Absolute: 0.5 10*3/uL (ref 0.1–1.0)
Monocytes Relative: 10 %
Neutro Abs: 3 10*3/uL (ref 1.7–7.7)
Neutrophils Relative %: 58 %
Platelets: 174 10*3/uL (ref 150–400)
RBC: 4.22 MIL/uL (ref 3.87–5.11)
RDW: 13.1 % (ref 11.5–15.5)
WBC: 5.2 10*3/uL (ref 4.0–10.5)
nRBC: 0 % (ref 0.0–0.2)

## 2021-03-23 LAB — LACTIC ACID, PLASMA: Lactic Acid, Venous: 2.1 mmol/L (ref 0.5–1.9)

## 2021-03-23 LAB — CBG MONITORING, ED: Glucose-Capillary: 174 mg/dL — ABNORMAL HIGH (ref 70–99)

## 2021-03-23 MED ORDER — SULFAMETHOXAZOLE-TRIMETHOPRIM 800-160 MG PO TABS: 1.0000 | ORAL_TABLET | Freq: Two times a day (BID) | ORAL | 0 refills | Status: DC

## 2021-03-23 MED ORDER — PANTOPRAZOLE SODIUM 40 MG PO TBEC: 40.0000 mg | DELAYED_RELEASE_TABLET | Freq: Every day | ORAL | 3 refills | Status: DC | PRN

## 2021-03-23 NOTE — ED Provider Notes (Addendum)
Emergency Medicine Provider Triage Evaluation Note  Julie Bass , a 87 y.o. female  was evaluated in triage.  Brought in by daughter from home for altered mental status.  Daughter states patient seemed to be hallucinating about 2 weeks ago which is typically what happens when she is getting a UTI, gave her Septra twice daily x3 days per plan with PCP.  Patient cleared up and was doing better today, patient is confused, seeing people injuries, ambulatory at home but does not seem to realize she is at home and asking where the bathroom is.  Patient does not recognize her daughter at this time, is alert to person and date of birth.  Has mentioned having abdominal pain today and has had decreased p.o. intake.  No recent falls or injuries.  Review of Systems  Positive: Altered mental status, abdominal pain, hallucinations Negative: Fever  Physical Exam  There were no vitals taken for this visit. Gen:   Awake, no distress   Resp:  Normal effort  MSK:   Moves extremities without difficulty  Other:  Abdomen soft and non tender  Medical Decision Making  Medically screening exam initiated at 9:20 PM.  Appropriate orders placed.  [name] was informed that the remainder of the evaluation will be completed by another provider, this initial triage assessment does not replace that evaluation, and the importance of remaining in the ED until their evaluation is complete.  9:44, called back to triage room by patient's daughter who reports patient is becoming more altered, spitting into a paper towel. Patient is diaphoretic, warm to the touch with temp 97.7. Follows simple commands.  Plan is to check CBG, added CT head.    Tacy Learn, PA-C 03/23/21 2126    Tacy Learn, PA-C 03/23/21 2145    Lucrezia Starch, MD 03/24/21 [phone]

## 2021-03-23 NOTE — ED Triage Notes (Signed)
Altered mental status - noticed about today. Pt seeing things that are not there.   Last week had episode of UTI and took septra x 3 days. Pt reports having abdominal pain. Hx of dementia.

## 2021-03-23 NOTE — ED Provider Notes (Signed)
[facility] EMERGENCY DEPARTMENT Provider Note   CSN: [id number] Arrival date & time: 03/23/21  2112     History Chief Complaint  Patient presents with  . Altered Mental Status    Julie Bass is a 87 y.o. female.  Patient is a 87 year old female with past medical history of hypertension, chronic renal insufficiency, depression, dementia.  Patient brought today for evaluation of altered mental status.  According to the daughter, she has been having confusion and hallucinations for the past 2 weeks.  This is typically what happens when she has a urinary tract infection.  Patient was treated for UTI last week with 3 days of Septra, but has been off of this medication for the past several days.  Patient is somewhat somnolent and adds little additional history.  She denies to me that she is experiencing any discomfort at present.  The history is provided by the patient.  Altered Mental Status Presenting symptoms: lethargy   Severity:  Moderate Duration:  2 weeks Timing:  Constant Progression:  Worsening Chronicity:  Recurrent      Past Medical History:  Diagnosis Date  . Arthritis   . Chronic kidney disease    KIDNEY STONES  . Dementia ([place])    BEGINNING STAGES   . Depression   . GERD (gastroesophageal reflux disease)   . Glaucoma   . Hiatal hernia   . HTN (hypertension) 10/18/2011  . Hyperlipemia 10/18/2011  . NSVT (nonsustained ventricular tachycardia) ([place]) 10/18/2011  . Osteoporosis   . PAD (peripheral artery disease) ([place]) 10/18/2011   h/o left SFA stent    Patient Active Problem List   Diagnosis Date Noted  . Acute metabolic encephalopathy 28/41/3244  . Lactic acidosis 05/29/2020  . Hypertensive urgency 05/29/2020  . Hiatal hernia 09/18/2019  . Peripheral artery disease ([place]) 09/18/2019  . Acute renal failure superimposed on stage 3a chronic kidney disease ([place]) 09/18/2019  . Essential hypertension 09/18/2019  . Neck mass 09/18/2019  .  Weakness 09/17/2019  . Pacemaker 05/28/2019  . Bilateral sensorineural hearing loss 05/09/2018  . CHB (complete heart block) ([place]) 01/20/2018  . OSA (obstructive sleep apnea) 01/20/2018  . 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 GERD without esophagitis 01/08/2018  . CKD (chronic kidney disease), stage III 01/07/2018  . Lower urinary tract infectious disease 06/16/2016  . Warthin's tumor 01/01/2016  . Dementia ([place]) 03/14/2015  . Orthostatic hypotension 08/27/2014  . Physical deconditioning 08/27/2014  . Abdominal pain in female 08/27/2014  . Osteoarthrosis, unspecified whether generalized or localized, shoulder region 01/26/2013  . Chest wall pain 12/26/2012  . NSVT (nonsustained ventricular tachycardia) ([place]) 10/18/2011  . HTN (hypertension) 10/18/2011  . HLD (hyperlipidemia) 10/18/2011  . PAD (peripheral artery disease) ([place]) 10/18/2011  . Syncope 09/30/2011  . Pelvic joint pain 09/30/2011  . Back pain 09/30/2011    Past Surgical History:  Procedure Laterality Date  . ABDOMINAL HYSTERECTOMY  1973  . CARDIAC CATHETERIZATION     2012  . CHOLECYSTECTOMY    . LOOP RECORDER IMPLANT N/A 08/27/2014   Procedure: LOOP RECORDER IMPLANT;  Surgeon: Coralyn Mark, MD;  Location: [facility] CATH LAB;  Service: Cardiovascular;  Laterality: N/A;  . LOOP RECORDER REMOVAL N/A 01/09/2018   Procedure: LOOP RECORDER REMOVAL;  Surgeon: Evans Lance, MD;  Location: [facility] CV LAB;  Service: Cardiovascular;  Laterality: N/A;  . LOWER EXTREMITY ANGIOGRAM  11/02/2007   stent of mid left SFA with 6x132mm EV3 self-expanding stent and 6x3 in prox region (Dr. Adora Fridge)  .  LOWER EXTREMITY ANGIOGRAM    . LOWER EXTREMITY ARTERIAL DOPPLER  2013   right SFA with 50-69% diameter reduction, right PTA/peroneal occluded, L SFA prox to stent has narrowing with increased velocities >60% diameter reduction, L SFA stent patent, L ATA w/occlusive disease, bilat ABIs show mild arterial insuffiency at rest  . NM MYOCAR PERF  WALL MOTION  10/2011   non-gated - normal stuy, EF 82%, normal LV wall motion  . PACEMAKER IMPLANT N/A 01/09/2018   Procedure: PACEMAKER IMPLANT;  Surgeon: Evans Lance, MD;  Location: [facility] CV LAB;  Service: Cardiovascular;  Laterality: N/A;  . REVERSE SHOULDER ARTHROPLASTY Right 01/26/2013   Procedure: RIGHT REVERSE TOTAL SHOULDER ARTHROPLASTY;  Surgeon: Augustin Schooling, MD;  Location: [facility];  Service: Orthopedics;  Laterality: Right;  . TRANSTHORACIC ECHOCARDIOGRAM  10/2012   EF 75-70%, mod conc hypertrophy, severely calcified MV annulus, LA mildly dailted, PA peak pressure 29mmHg     OB History   No obstetric history on file.     Family History  Problem Relation Age of Onset  . Throat cancer Father   . Cancer Mother   . CAD Son   . Breast cancer Sister        cervical cancer  . Colon cancer Brother        throat cancer  . Pulmonary embolism Daughter   . Hypertension Daughter     Social History   Tobacco Use  . Smoking status: Former Smoker    Quit date: 05/07/1984    Years since quitting: 36.9  . Smokeless tobacco: Never Used  Substance Use Topics  . Alcohol use: No  . Drug use: No    Home Medications Prior to Admission medications   Medication Sig Start Date End Date Taking? Authorizing Provider  citalopram (CELEXA) 20 MG tablet TAKE 1 TABLET BY MOUTH EVERY DAY 02/18/21   Denita Lung, MD  mirtazapine (REMERON SOL-TAB) 15 MG disintegrating tablet TAKE 1 TABLET BY MOUTH EVERYDAY AT BEDTIME 03/16/21   Denita Lung, MD  sulfamethoxazole-trimethoprim (BACTRIM DS) 800-160 MG tablet Take 1 tablet by mouth 2 (two) times daily. 03/23/21   Denita Lung, MD  amLODipine (NORVASC) 2.5 MG tablet TAKE 1 TABLET (2.5 MG TOTAL) BY MOUTH EVERY EVENING. 12/11/20   Troy Sine, MD  cilostazol (PLETAL) 50 MG tablet TAKE 1 TABLET BY MOUTH TWICE A DAY 02/20/21   Croitoru, Mihai, MD  CRANBERRY PO Take 1 capsule by mouth daily.    [provider]  ezetimibe (ZETIA) 10  MG tablet TAKE 1 TABLET BY MOUTH EVERY DAY 09/16/20   Troy Sine, MD  feeding supplement, ENSURE COMPLETE, (ENSURE COMPLETE) LIQD Take 237 mLs by mouth 3 (three) times daily between meals. Patient taking differently: Take 237 mLs by mouth See admin instructions. Once to twice daily 08/27/14   Barrett, Evelene Croon, PA-C  geriatric multivitamins-minerals (ELDERTONIC/GEVRABON) LIQD Take 15 mLs by mouth daily.    [provider]  HYDROcodone-acetaminophen (NORCO/VICODIN) 5-325 MG tablet Take 1 tablet by mouth every 4 (four) hours as needed. 02/20/21   Malvin Johns, MD  lidocaine (LIDODERM) 5 % Place 1 patch onto the skin daily. Remove & Discard patch within 12 hours or as directed by MD 02/20/21   Malvin Johns, MD  LIVALO 2 MG TABS TAKE 1 TABLET BY MOUTH EVERY DAY 01/01/21   Troy Sine, MD  metoprolol succinate (TOPROL-XL) 100 MG 24 hr tablet Take 1 tablet (100 mg total) by mouth every  evening. 09/11/20   Troy Sine, MD  metoprolol succinate (TOPROL-XL) 25 MG 24 hr tablet Take 1 tablet (25 mg total) by mouth in the morning. Take with or immediately following a meal. 09/11/20 12/10/20  Troy Sine, MD  omega-3 acid ethyl esters (LOVAZA) 1 g capsule TAKE 1 CAPSULE (1 G TOTAL) BY MOUTH EVERY EVENING. 01/01/21   Troy Sine, MD  pantoprazole (PROTONIX) 40 MG tablet Take 1 tablet (40 mg total) by mouth daily as needed (for acid reflux). 03/23/21   Troy Sine, MD  timolol (TIMOPTIC) 0.5 % ophthalmic solution Place 1 drop into both eyes 2 (two) times daily.     [provider]    Allergies    Atorvastatin calcium, Crestor [rosuvastatin calcium], Lipitor [atorvastatin calcium], and Rosuvastatin calcium  Review of Systems   Review of Systems  All other systems reviewed and are negative.   Physical Exam Updated Vital Signs BP (!) 150/72 (BP Location: Right Arm)   Pulse 74   Temp 98.2 F (36.8 C) (Oral)   Resp 16   Ht 5\' 4"  (1.626 m)   Wt 48.7 kg   SpO2 97%   BMI  18.43 kg/m   Physical Exam Vitals and nursing note reviewed.  Constitutional:      General: She is not in acute distress.    Appearance: She is well-developed. She is not diaphoretic.     Comments: Patient is somewhat somnolent, but arousable.  She follows commands.  HENT:     Head: Normocephalic and atraumatic.  Cardiovascular:     Rate and Rhythm: Normal rate and regular rhythm.     Heart sounds: No murmur heard. No friction rub. No gallop.   Pulmonary:     Effort: Pulmonary effort is normal. No respiratory distress.     Breath sounds: Normal breath sounds. No wheezing.  Abdominal:     General: Bowel sounds are normal. There is no distension.     Palpations: Abdomen is soft.     Tenderness: There is no abdominal tenderness.  Musculoskeletal:        General: Normal range of motion.     Cervical back: Normal range of motion and neck supple.  Skin:    General: Skin is warm and dry.  Neurological:     Mental Status: She is oriented to person, place, and time.     Cranial Nerves: No cranial nerve deficit.     Motor: Weakness present.     Coordination: Coordination normal.     Comments: There is no focal deficit, but patient does display generalized weakness.     ED Results / Procedures / Treatments   Labs (all labs ordered are listed, but only abnormal results are displayed) Labs Reviewed  CBC WITH DIFFERENTIAL/PLATELET - Abnormal; Notable for the following components:      Result Value   MCV 101.2 (*)    All other components within normal limits  COMPREHENSIVE METABOLIC PANEL - Abnormal; Notable for the following components:   CO2 21 (*)    Glucose, Bld 184 (*)    Creatinine, Ser 1.49 (*)    GFR, Estimated 33 (*)    All other components within normal limits  LACTIC ACID, PLASMA - Abnormal; Notable for the following components:   Lactic Acid, Venous 2.1 (*)    All other components within normal limits  CBG MONITORING, ED - Abnormal; Notable for the following  components:   Glucose-Capillary 174 (*)    All other components  within normal limits  URINE CULTURE  URINALYSIS, ROUTINE W REFLEX MICROSCOPIC  CBG MONITORING, ED    EKG None  Radiology CT Head Wo Contrast  Result Date: 03/23/2021 CLINICAL DATA:  Mental status change EXAM: CT HEAD WITHOUT CONTRAST TECHNIQUE: Contiguous axial images were obtained from the base of the skull through the vertex without intravenous contrast. COMPARISON:  CT 02/20/2021, 05/29/2020 FINDINGS: Brain: No acute territorial infarction, hemorrhage, or intracranial mass. Moderate hypodensity in the white matter consistent with chronic small vessel ischemic change. Chronic appearing small infarcts in the right parasagittal frontal lobe and the right cerebellum. Moderate hypodensity in the white matter consistent with chronic small vessel ischemic change. Stable ventricle size. Vascular: No hyperdense vessels. Vertebral and carotid vascular calcification Skull: Normal. Negative for fracture or focal lesion. Sinuses/Orbits: No acute finding. Other: None IMPRESSION: 1. No CT evidence for acute intracranial abnormality. 2. Atrophy and chronic small vessel ischemic changes of the white matter. Electronically Signed   By: Donavan Foil M.D.   On: 03/23/2021 22:57    Procedures Procedures   Medications Ordered in ED Medications - No data to display  ED Course  I have reviewed the triage vital signs and the nursing notes.  Pertinent labs & imaging results that were available during my care of the patient were reviewed by me and considered in my medical decision making (see chart for details).    MDM Rules/Calculators/A&P  Patient is a 87 year old female brought by her son-in-law for evaluation of confusion and possible UTI.    Work-up initiated in triage including laboratory studies, CT scan of the head and urinalysis have all returned essentially unremarkable.  Patient observed in the ER for several hours and was able to  ambulate with assistance from her son in law to the bathroom.  At this point, patient's mental status is improving.  She does have a history of dementia and I suspect this was an exacerbation of this.  Discharge seems appropriate with outpatient follow-up and return as needed.  Final Clinical Impression(s) / ED Diagnoses Final diagnoses:  None    Rx / DC Orders ED Discharge Orders    None       Veryl Speak, MD 03/24/21 [phone]

## 2021-03-23 NOTE — Telephone Encounter (Signed)
Please advise KH 

## 2021-03-23 NOTE — ED Notes (Signed)
MD notified of BP

## 2021-03-24 LAB — URINALYSIS, ROUTINE W REFLEX MICROSCOPIC
Bilirubin Urine: NEGATIVE
Glucose, UA: NEGATIVE mg/dL
Hgb urine dipstick: NEGATIVE
Ketones, ur: NEGATIVE mg/dL
Leukocytes,Ua: NEGATIVE
Nitrite: NEGATIVE
Protein, ur: NEGATIVE mg/dL
Specific Gravity, Urine: 1.005 (ref 1.005–1.030)
pH: 7 (ref 5.0–8.0)

## 2021-03-24 NOTE — Discharge Instructions (Addendum)
Continue medications as previously prescribed.  Follow-up with your primary doctor in the next few days, and return to the ER if symptoms significantly worsen or change. 

## 2021-03-25 LAB — URINE CULTURE

## 2021-03-29 ENCOUNTER — Other Ambulatory Visit: Payer: Self-pay | Admitting: Cardiovascular Disease

## 2021-03-29 ENCOUNTER — Other Ambulatory Visit: Payer: Self-pay | Admitting: Family Medicine

## 2021-03-30 ENCOUNTER — Ambulatory Visit (INDEPENDENT_AMBULATORY_CARE_PROVIDER_SITE_OTHER): Payer: Medicare Other

## 2021-03-30 DIAGNOSIS — I442 Atrioventricular block, complete: Secondary | ICD-10-CM

## 2021-03-30 NOTE — Telephone Encounter (Signed)
[facility] to [facility]. Please advise [facility]

## 2021-03-31 LAB — CUP PACEART REMOTE DEVICE CHECK
Date Time Interrogation Session: 20220516164815
Implantable Lead Implant Date: 20190225
Implantable Lead Implant Date: 20190225
Implantable Lead Location: 753859
Implantable Lead Location: 753860
Implantable Lead Model: 377
Implantable Lead Model: 377
Implantable Lead Serial Number: 80514208
Implantable Lead Serial Number: 80598782
Implantable Pulse Generator Implant Date: 20190225
Pulse Gen Model: 407145
Pulse Gen Serial Number: 69245949

## 2021-04-22 NOTE — Progress Notes (Signed)
Remote pacemaker transmission.   

## 2021-04-29 ENCOUNTER — Other Ambulatory Visit: Payer: Self-pay | Admitting: Cardiovascular Disease

## 2021-04-29 ENCOUNTER — Other Ambulatory Visit: Payer: Self-pay | Admitting: Family Medicine

## 2021-04-30 NOTE — Telephone Encounter (Signed)
[facility]. Please advise [facility]

## 2021-05-07 DIAGNOSIS — H401133 Primary open-angle glaucoma, bilateral, severe stage: Secondary | ICD-10-CM | POA: Diagnosis not present

## 2021-05-21 ENCOUNTER — Other Ambulatory Visit: Payer: Self-pay | Admitting: *Deleted

## 2021-05-21 MED ORDER — METOPROLOL SUCCINATE ER 100 MG PO TB24: 100.0000 mg | ORAL_TABLET | Freq: Every evening | ORAL | 3 refills | Status: DC

## 2021-06-29 ENCOUNTER — Ambulatory Visit (INDEPENDENT_AMBULATORY_CARE_PROVIDER_SITE_OTHER): Payer: Medicare Other

## 2021-06-29 DIAGNOSIS — I442 Atrioventricular block, complete: Secondary | ICD-10-CM

## 2021-07-01 LAB — CUP PACEART REMOTE DEVICE CHECK
Date Time Interrogation Session: 20220815183637
Implantable Lead Implant Date: 20190225
Implantable Lead Implant Date: 20190225
Implantable Lead Location: 753859
Implantable Lead Location: 753860
Implantable Lead Model: 377
Implantable Lead Model: 377
Implantable Lead Serial Number: 80514208
Implantable Lead Serial Number: 80598782
Implantable Pulse Generator Implant Date: 20190225
Pulse Gen Model: 407145
Pulse Gen Serial Number: 69245949

## 2021-07-04 ENCOUNTER — Other Ambulatory Visit: Payer: Self-pay | Admitting: Family Medicine

## 2021-07-04 ENCOUNTER — Other Ambulatory Visit: Payer: Self-pay | Admitting: Cardiovascular Disease

## 2021-07-06 NOTE — Telephone Encounter (Signed)
Received refill request for pts. Mirtazapine last apt was 12/25/20 and has no future apts.

## 2021-07-17 NOTE — Progress Notes (Signed)
Remote pacemaker transmission.   

## 2021-08-25 ENCOUNTER — Telehealth: Payer: Self-pay | Admitting: Family Medicine

## 2021-08-25 NOTE — Telephone Encounter (Signed)
Done [facility] 

## 2021-08-25 NOTE — Telephone Encounter (Signed)
Pt's daughter called Ms Dombrosky received her HD flu vaccine on 08/24/2021 at [place]

## 2021-09-01 ENCOUNTER — Other Ambulatory Visit: Payer: Self-pay | Admitting: Family Medicine

## 2021-09-01 ENCOUNTER — Encounter: Payer: Self-pay | Admitting: Family Medicine

## 2021-09-02 ENCOUNTER — Other Ambulatory Visit: Payer: Self-pay

## 2021-09-02 DIAGNOSIS — R5381 Other malaise: Secondary | ICD-10-CM

## 2021-09-02 DIAGNOSIS — R630 Anorexia: Secondary | ICD-10-CM

## 2021-09-02 DIAGNOSIS — R627 Adult failure to thrive: Secondary | ICD-10-CM

## 2021-09-02 NOTE — Telephone Encounter (Signed)
[facility]. Please advise [facility]

## 2021-09-02 NOTE — Telephone Encounter (Signed)
Done Kh 

## 2021-09-03 ENCOUNTER — Telehealth: Payer: Self-pay

## 2021-09-03 NOTE — Telephone Encounter (Signed)
Dr. Tomasa Hosteller call to advise that pt does not meet the requirements. He has asked for a verbal to change order to palliative care. He stated that you can call him if you have questions [phone]. If not please advise if this is ok for pt referral to be changed .  Blima Singer RMA

## 2021-09-04 NOTE — Telephone Encounter (Signed)
Dr. Tomasa Hosteller was advised. [place]

## 2021-09-07 ENCOUNTER — Telehealth: Payer: Self-pay

## 2021-09-07 NOTE — Telephone Encounter (Signed)
Attempted to contact patient's daughter [name] to schedule a Palliative Care consult appointment. No answer left a message to return call.

## 2021-09-10 ENCOUNTER — Telehealth: Payer: Self-pay

## 2021-09-10 DIAGNOSIS — H401133 Primary open-angle glaucoma, bilateral, severe stage: Secondary | ICD-10-CM | POA: Diagnosis not present

## 2021-09-10 NOTE — Telephone Encounter (Signed)
Attempted to contact patient's daughter [name] to schedule a Palliative Care consult appointment. No answer left a message to return call.

## 2021-09-14 ENCOUNTER — Telehealth: Payer: Self-pay

## 2021-09-14 NOTE — Telephone Encounter (Signed)
Attempted to contact patient's daughter [name] to schedule a Palliative Care consult appointment. No answer left a message to return call.

## 2021-09-16 ENCOUNTER — Other Ambulatory Visit: Payer: Self-pay | Admitting: Cardiovascular Disease

## 2021-09-16 ENCOUNTER — Other Ambulatory Visit: Payer: Self-pay | Admitting: Family Medicine

## 2021-09-17 NOTE — Telephone Encounter (Signed)
[facility]. Please advise [facility] °

## 2021-09-18 ENCOUNTER — Telehealth: Payer: Self-pay

## 2021-09-18 NOTE — Telephone Encounter (Signed)
Spoke with patient's daughter [name] and scheduled an in-person Palliative Consult for 10/14/21 @ 12:30PM.  COVID screening was negative. No pets in home. Patient's nephew lives with her.  Consent obtained; updated Outlook/Netsmart/Team List and Epic.   Family is aware they may be receiving a call from provider the day before or day of to confirm appointment.

## 2021-09-28 ENCOUNTER — Ambulatory Visit (INDEPENDENT_AMBULATORY_CARE_PROVIDER_SITE_OTHER): Payer: Medicare Other

## 2021-09-28 DIAGNOSIS — I442 Atrioventricular block, complete: Secondary | ICD-10-CM

## 2021-09-28 LAB — CUP PACEART REMOTE DEVICE CHECK
Date Time Interrogation Session: 20221114081554
Implantable Lead Implant Date: 20190225
Implantable Lead Implant Date: 20190225
Implantable Lead Location: 753859
Implantable Lead Location: 753860
Implantable Lead Model: 377
Implantable Lead Model: 377
Implantable Lead Serial Number: 80514208
Implantable Lead Serial Number: 80598782
Implantable Pulse Generator Implant Date: 20190225
Pulse Gen Model: 407145
Pulse Gen Serial Number: 69245949

## 2021-10-06 NOTE — Progress Notes (Signed)
Remote pacemaker transmission.   

## 2021-10-14 ENCOUNTER — Other Ambulatory Visit: Payer: Medicare Other | Admitting: Hospice

## 2021-10-14 ENCOUNTER — Other Ambulatory Visit: Payer: Self-pay

## 2021-10-14 DIAGNOSIS — I10 Essential (primary) hypertension: Secondary | ICD-10-CM

## 2021-10-14 DIAGNOSIS — F0390 Unspecified dementia without behavioral disturbance: Secondary | ICD-10-CM

## 2021-10-14 DIAGNOSIS — I739 Peripheral vascular disease, unspecified: Secondary | ICD-10-CM

## 2021-10-14 DIAGNOSIS — Z515 Encounter for palliative care: Secondary | ICD-10-CM | POA: Diagnosis not present

## 2021-10-14 NOTE — Progress Notes (Signed)
[facility] Consult Note Telephone: [phone]  Fax: [phone]  PATIENT NAME: Julie Bass [address] [place] [address] [phone] (home)  DOB: [birth year] MRN: [id number]  PRIMARY CARE PROVIDER:    Denita Lung, MD,  [place] [address] [phone]  REFERRING PROVIDER:   Denita Lung, [place],  [place] [address] [phone]  RESPONSIBLE PARTY:   [name] - best to call [name] works as a Psychologist, sport and exercise with Heart care.    I met face to face with patient at home. Palliative Care was asked to follow this patient by consultation request of  Denita Lung, MD to address advance care planning, complex medical decision making and goals of care clarification. [name] is home with patient during visit. This is the initial visit.    ASSESSMENT AND / RECOMMENDATIONS:   Advance Care Planning: Our advance care planning conversation included a discussion about:    The value and importance of advance care planning  Difference between Hospice and Palliative care Exploration of goals of care in the event of a sudden injury or illness  Identification and preparation of a healthcare agent  Review and updating or creation of an  advance directive document . Decision not to resuscitate or to de-escalate disease focused treatments due to poor prognosis.  CODE STATUS:Patient is a Do Not Resuscitate. NP signed DNR for patient to keep at home. Same document uploaded to Epic  Goals of Care: Goals include to maximize quality of life and symptom management. Patient was recently recommended for hospice service but was not admitted.  Patient/family still interested in hospice service when patient qualifies for it.  I spent 46 minutes providing this initial consultation. More than 50% of the time in this consultation was spent on counseling patient and coordinating  communication. --------------------------------------------------------------------------------------------------------------------------------------  Symptom Management/Plan: Dementia: Memory loss and confusion is ongoing in line with Dementia disease trajectory. FAST 6C, ambulatory with her rolling walker within her home. Education on what to expect; encouraged ongoing supportive care. PAD: Chronic, continue with Pletal, Zetia, Pitavastatin. Followed by Cardiologist HTN/Complete Heart Block:Managed with Metoprolol, Amlodipine.  Pacemaker in place. Followed by Cardiologist.  Follow up: Palliative care will continue to follow for complex medical decision making, advance care planning, and clarification of goals. Return 6 weeks or prn.Encouraged to call provider sooner with any concerns.   Family /Caregiver/Community Supports: Patient lives at home at home with her nephew. Daughter - [name] lives nearby and visit daily; coordinates her care. Strong family support system identified. Private pay caregiver comes twice a week for assistance with bathing and ADLs.   HOSPICE ELIGIBILITY/DIAGNOSIS: TBD  Chief Complaint: Initial Palliative care visit  HISTORY OF PRESENT ILLNESS:  Julie Bass is a 87 y.o. year old female  with multiple medical conditions including Dementia which is progressive and worsening with decline in her cognitive and functional decline which impairs her quality of life. Her memory loss and confusion is worse as the day progresses; cueing is sometimes helpful. History of PAD, Anxiety managed with Citalopram and Rameron.  History obtained from review of EMR, discussion with primary team, caregiver, family and/or Ms. Hassell Done.  Review and summarization of Epic records shows history from other than patient. Rest of 10 point ROS asked and negative.  I reviewed, as needed, available labs, patient records, imaging, studies and related documents from the EMR.  ROS General: NAD EYES:  denies vision changes, wears eyeglasses, followed by Ophthalmologist  ENMT: denies dysphagia Cardiovascular: denies chest pain/discomfort Pulmonary: denies cough, denies SOB Abdomen: endorses good appetite, denies constipation/diarrhea GU: denies dysuria, urinary frequency MSK:  endorses weakness,  no falls reported Skin: denies rashes or wounds Neurological: denies pain, denies insomnia Psych: Endorses positive mood Heme/lymph/immuno: denies bruises, abnormal bleeding  Physical Exam: Height/Weight: 5 feet 4 inches/115 Ibs BP 132/68 P 60 R 19 02 97% RA Constitutional: NAD General: Well groomed, cooperative EYES: anicteric sclera, lids intact, no discharge  ENMT: Moist mucous membrane CV: S1 S2, RRR, no LE edema Pulmonary: LCTA, no increased work of breathing, no cough, Abdomen: active BS + 4 quadrants, soft and non tender GU: no suprapubic tenderness MSK: weakness, limited ROM, ambulatory with rolling walker Skin: warm and dry, no rashes or wounds on visible skin Neuro:  weakness, otherwise non focal, memory loss/confusion Psych: non-anxious affect Hem/lymph/immuno: no widespread bruising   PAST MEDICAL HISTORY:  Active Ambulatory Problems    Diagnosis Date Noted   Syncope 09/30/2011   Pelvic joint pain 09/30/2011   Back pain 09/30/2011   NSVT (nonsustained ventricular tachycardia) 10/18/2011   HTN (hypertension) 10/18/2011   HLD (hyperlipidemia) 10/18/2011   PAD (peripheral artery disease) ([place]) 10/18/2011   Chest wall pain 12/26/2012   Osteoarthrosis, unspecified whether generalized or localized, shoulder region 01/26/2013   Orthostatic hypotension 08/27/2014   Physical deconditioning 08/27/2014   Abdominal pain in female 08/27/2014   Dementia ([place]) 03/14/2015   Warthin's tumor 01/01/2016   Lower urinary tract infectious disease 06/16/2016   CKD (chronic kidney disease), stage III 01/07/2018   GERD without esophagitis 01/08/2018   Stokes-Adams syncope 01/09/2018    CHB (complete heart block) ([place]) 01/20/2018   OSA (obstructive sleep apnea) 01/20/2018   Pacemaker 05/28/2019   Weakness 09/17/2019   Hiatal hernia 09/18/2019   Peripheral artery disease ([place]) 09/18/2019   Acute renal failure superimposed on stage 3a chronic kidney disease ([place]) 09/18/2019   Essential hypertension 09/18/2019   Neck mass 09/18/2019   Bilateral sensorineural hearing loss 48/27/0786   Acute metabolic encephalopathy 75/44/9201   Lactic acidosis 05/29/2020   Hypertensive urgency 05/29/2020   Resolved Ambulatory Problems    Diagnosis Date Noted   Cough 09/30/2011   Fatigue 09/30/2011   Fall 09/30/2011   Need for prophylactic vaccination and inoculation against influenza 09/30/2011   Rib contusion 12/26/2012   Bacteremia 06/16/2016   Chest pain 01/07/2018   ARF (acute renal failure) ([place]) 09/17/2019   Fever 09/18/2019   Past Medical History:  Diagnosis Date   Arthritis    Chronic kidney disease    Depression    GERD (gastroesophageal reflux disease)    Glaucoma    Hyperlipemia 10/18/2011   Osteoporosis     SOCIAL HX:  Social History   Tobacco Use   Smoking status: Former    Types: Cigarettes    Quit date: 05/07/1984    Years since quitting: 37.4   Smokeless tobacco: Never  Substance Use Topics   Alcohol use: No     FAMILY HX:  Family History  Problem Relation Age of Onset   Throat cancer Father    Cancer Mother    CAD Son    Breast cancer Sister        cervical cancer   Colon cancer Brother        throat cancer   Pulmonary embolism Daughter    Hypertension Daughter       ALLERGIES:  Allergies  Allergen Reactions   Atorvastatin Calcium     Other  reaction(s): Myalgias (intolerance) Muscle pain   Crestor [Rosuvastatin Calcium]     Muscle pain   Lipitor [Atorvastatin Calcium]     Muscle pain   Rosuvastatin Calcium     Other reaction(s): Myalgias (intolerance) Muscle pain      PERTINENT MEDICATIONS:  Outpatient Encounter Medications  as of 10/14/2021  Medication Sig   citalopram (CELEXA) 20 MG tablet TAKE 1 TABLET BY MOUTH EVERY DAY   sulfamethoxazole-trimethoprim (BACTRIM DS) 800-160 MG tablet Take 1 tablet by mouth 2 (two) times daily.   amLODipine (NORVASC) 2.5 MG tablet TAKE 1 TABLET (2.5 MG TOTAL) BY MOUTH EVERY EVENING.   cilostazol (PLETAL) 50 MG tablet TAKE 1 TABLET BY MOUTH TWICE A DAY   citalopram (CELEXA) 10 MG tablet TAKE 1 TABLET BY MOUTH EVERY DAY   CRANBERRY PO Take 1 capsule by mouth daily.   ezetimibe (ZETIA) 10 MG tablet TAKE 1 TABLET BY MOUTH EVERY DAY   feeding supplement, ENSURE COMPLETE, (ENSURE COMPLETE) LIQD Take 237 mLs by mouth 3 (three) times daily between meals. (Patient taking differently: Take 237 mLs by mouth See admin instructions. Once to twice daily)   geriatric multivitamins-minerals (ELDERTONIC/GEVRABON) LIQD Take 15 mLs by mouth daily.   HYDROcodone-acetaminophen (NORCO/VICODIN) 5-325 MG tablet Take 1 tablet by mouth every 4 (four) hours as needed.   lidocaine (LIDODERM) 5 % Place 1 patch onto the skin daily. Remove & Discard patch within 12 hours or as directed by MD   LIVALO 2 MG TABS TAKE 1 TABLET BY MOUTH EVERY DAY   metoprolol succinate (TOPROL-XL) 100 MG 24 hr tablet Take 1 tablet (100 mg total) by mouth every evening.   mirtazapine (REMERON SOL-TAB) 15 MG disintegrating tablet TAKE 1 TABLET BY MOUTH EVERYDAY AT BEDTIME   omega-3 acid ethyl esters (LOVAZA) 1 g capsule TAKE 1 CAPSULE (1 G TOTAL) BY MOUTH EVERY EVENING.   pantoprazole (PROTONIX) 40 MG tablet Take 1 tablet (40 mg total) by mouth daily as needed (for acid reflux).   timolol (TIMOPTIC) 0.5 % ophthalmic solution Place 1 drop into both eyes 2 (two) times daily.    No facility-administered encounter medications on file as of 10/14/2021.   Thank you for the opportunity to participate in the care of Ms. Hassell Done.  The palliative care team will continue to follow. Please call our office at [phone] if we can be of  additional assistance.   Note: Portions of this note were generated with Lobbyist. Dictation errors may occur despite best attempts at proofreading.  Teodoro Spray, NP

## 2021-10-27 ENCOUNTER — Other Ambulatory Visit: Payer: Self-pay | Admitting: Family Medicine

## 2021-10-28 MED ORDER — MIRTAZAPINE 15 MG PO TBDP: 15.0000 mg | ORAL_TABLET | Freq: Every day | ORAL | 0 refills | Status: DC

## 2021-10-28 MED ORDER — SULFAMETHOXAZOLE-TRIMETHOPRIM 800-160 MG PO TABS: 1.0000 | ORAL_TABLET | Freq: Two times a day (BID) | ORAL | 0 refills | Status: DC

## 2021-11-12 DIAGNOSIS — H401133 Primary open-angle glaucoma, bilateral, severe stage: Secondary | ICD-10-CM | POA: Diagnosis not present

## 2021-11-13 ENCOUNTER — Other Ambulatory Visit: Payer: Self-pay

## 2021-11-13 ENCOUNTER — Other Ambulatory Visit: Payer: Medicare Other | Admitting: Hospice

## 2021-11-13 DIAGNOSIS — K5901 Slow transit constipation: Secondary | ICD-10-CM

## 2021-11-13 DIAGNOSIS — F0390 Unspecified dementia without behavioral disturbance: Secondary | ICD-10-CM

## 2021-11-13 DIAGNOSIS — I739 Peripheral vascular disease, unspecified: Secondary | ICD-10-CM

## 2021-11-13 DIAGNOSIS — Z515 Encounter for palliative care: Secondary | ICD-10-CM

## 2021-11-13 NOTE — Progress Notes (Signed)
[facility] Consult Note Telephone: [phone]  Fax: [phone]  PATIENT NAME: Julie Bass [address] [place] [address] [phone] (home)  DOB: [birth year] MRN: [id number]  PRIMARY CARE PROVIDER:    Denita Lung, MD,  [place] [address] [phone]  REFERRING PROVIDER:   Denita Lung, [place],  [place] [address] [phone]  RESPONSIBLE PARTY:   [name] - best to call [name] works as a Psychologist, sport and exercise with Heart care.  Larry stays at home with patient.   I met face to face with patient at home. Palliative Care was asked to follow this patient by consultation request of  Denita Lung, MD to address advance care planning, complex medical decision making and goals of care clarification. [name] is home with patient during visit. This is a follow up visit.    ASSESSMENT AND / RECOMMENDATIONS:   CODE STATUS: Patient is a Do Not Resuscitate.  Goals of Care: Goals include to maximize quality of life and symptom management.  Patient was recently recommended for hospice service but was not admitted.   Patient/family still interested in hospice service when patient qualifies for it.  Visit consisted of counseling and education dealing with the complex and emotionally intense issues of symptom management and palliative care in the setting of serious and potentially life-threatening illness. Therapeutic listening and validation also provided. Palliative care team will continue to support patient, patient's family, and medical team.  Symptom Management/Plan: Dementia: Memory loss and confusion at baseline. FAST 6C, ambulatory with her rolling walker within her home. Education reiterated on what to expect; encouraged ongoing supportive care. Offer small meals, soft meals, finger foods. Provide assistance meals to ensure adequate oral intake. PAD: Chronic, continue  with Pletal, Zetia, Pitavastatin. Followed by Cardiologist HTN/Complete Heart Block:Managed with Metoprolol, Amlodipine.  Pacemaker in place. Followed by Cardiologist.  Constipation: Miralax 17 gram in a 4-8 ounces of fluid of choice BID and back off with diarrhea. Maintenance bowel regimen with 17 gram prn daily.   Follow up: Palliative care will continue to follow for complex medical decision making, advance care planning, and clarification of goals. Return 6 weeks or prn.Encouraged to call provider sooner with any concerns.   Family /Caregiver/Community Supports: Patient lives at home at home with her nephew. Daughter - [name] lives nearby and visit daily; coordinates her care.  Strong family support system identified.  Private pay caregiver comes twice a week for assistance with bathing and ADLs.   HOSPICE ELIGIBILITY/DIAGNOSIS: TBD  Chief Complaint: Follow up visit  HISTORY OF PRESENT ILLNESS:  Julie Bass is a 87 y.o. year old female  with multiple medical conditions including Dementia ,  PAD, HTN, presence of pacemaker. History of Anxiety managed (with Citalopram and Rameron). Patient denies pain/discomfort. [name] with no concerns after discussions on Dementia disease trajectory and how to care for patient in terms engaging her and the kinds of foods she may eat without difficulty. All her questions were answered to her satisfaction.  History obtained from review of EMR, discussion with primary team, caregiver, family and/or Ms. Hassell Done.  Review and summarization of Epic records shows history from other than patient. Rest of 10 point ROS asked and negative.  I reviewed, as needed, available labs, patient records, imaging, studies and related documents from the EMR.  ROS General: NAD EYES: denies vision changes, wears eyeglasses, followed by Ophthalmologist ENMT: denies dysphagia Cardiovascular: denies chest pain/discomfort Pulmonary: denies cough, denies  SOB Abdomen: endorses fair  to good appetite, denies constipation/diarrhea GU: denies dysuria, urinary frequency MSK:  endorses weakness,  no falls reported Skin: denies rashes or wounds Neurological: denies pain, denies insomnia Psych: Endorses positive mood Heme/lymph/immuno: denies bruises, abnormal bleeding  Physical Exam: Height/Weight: 5 feet 4 inches/115 Ibs Constitutional: NAD General: Well groomed, cooperative EYES: anicteric sclera, lids intact, no discharge  ENMT: Moist mucous membrane CV: S1 S2, RRR, no LE edema Pulmonary: LCTA, no increased work of breathing, no cough, Abdomen: active BS + 4 quadrants, soft and non tender GU: no suprapubic tenderness MSK: weakness, limited ROM, ambulatory with rolling walker Skin: warm and dry, no rashes or wounds on visible skin Neuro:  weakness, otherwise non focal, memory loss/confusion Psych: non-anxious affect Hem/lymph/immuno: no widespread bruising   PAST MEDICAL HISTORY:  Active Ambulatory Problems    Diagnosis Date Noted   Syncope 09/30/2011   Pelvic joint pain 09/30/2011   Back pain 09/30/2011   NSVT (nonsustained ventricular tachycardia) 10/18/2011   HTN (hypertension) 10/18/2011   HLD (hyperlipidemia) 10/18/2011   PAD (peripheral artery disease) ([place]) 10/18/2011   Chest wall pain 12/26/2012   Osteoarthrosis, unspecified whether generalized or localized, shoulder region 01/26/2013   Orthostatic hypotension 08/27/2014   Physical deconditioning 08/27/2014   Abdominal pain in female 08/27/2014   Dementia ([place]) 03/14/2015   Warthin's tumor 01/01/2016   Lower urinary tract infectious disease 06/16/2016   CKD (chronic kidney disease), stage III 01/07/2018   GERD without esophagitis 01/08/2018   Stokes-Adams syncope 01/09/2018   CHB (complete heart block) ([place]) 01/20/2018   OSA (obstructive sleep apnea) 01/20/2018   Pacemaker 05/28/2019   Weakness 09/17/2019   Hiatal hernia 09/18/2019   Peripheral artery disease ([place]) 09/18/2019   Acute renal  failure superimposed on stage 3a chronic kidney disease ([place]) 09/18/2019   Essential hypertension 09/18/2019   Neck mass 09/18/2019   Bilateral sensorineural hearing loss 93/26/7124   Acute metabolic encephalopathy 58/07/9832   Lactic acidosis 05/29/2020   Hypertensive urgency 05/29/2020   Resolved Ambulatory Problems    Diagnosis Date Noted   Cough 09/30/2011   Fatigue 09/30/2011   Fall 09/30/2011   Need for prophylactic vaccination and inoculation against influenza 09/30/2011   Rib contusion 12/26/2012   Bacteremia 06/16/2016   Chest pain 01/07/2018   ARF (acute renal failure) ([place]) 09/17/2019   Fever 09/18/2019   Past Medical History:  Diagnosis Date   Arthritis    Chronic kidney disease    Depression    GERD (gastroesophageal reflux disease)    Glaucoma    Hyperlipemia 10/18/2011   Osteoporosis     SOCIAL HX:  Social History   Tobacco Use   Smoking status: Former    Types: Cigarettes    Quit date: 05/07/1984    Years since quitting: 37.5   Smokeless tobacco: Never  Substance Use Topics   Alcohol use: No     FAMILY HX:  Family History  Problem Relation Age of Onset   Throat cancer Father    Cancer Mother    CAD Son    Breast cancer Sister        cervical cancer   Colon cancer Brother        throat cancer   Pulmonary embolism Daughter    Hypertension Daughter       ALLERGIES:  Allergies  Allergen Reactions   Atorvastatin Calcium     Other reaction(s): Myalgias (intolerance) Muscle pain   Crestor [Rosuvastatin Calcium]     Muscle pain  Lipitor [Atorvastatin Calcium]     Muscle pain   Rosuvastatin Calcium     Other reaction(s): Myalgias (intolerance) Muscle pain      PERTINENT MEDICATIONS:  Outpatient Encounter Medications as of 11/13/2021  Medication Sig   citalopram (CELEXA) 20 MG tablet TAKE 1 TABLET BY MOUTH EVERY DAY   amLODipine (NORVASC) 2.5 MG tablet TAKE 1 TABLET (2.5 MG TOTAL) BY MOUTH EVERY EVENING.   cilostazol (PLETAL) 50 MG  tablet TAKE 1 TABLET BY MOUTH TWICE A DAY   citalopram (CELEXA) 10 MG tablet TAKE 1 TABLET BY MOUTH EVERY DAY   CRANBERRY PO Take 1 capsule by mouth daily.   ezetimibe (ZETIA) 10 MG tablet TAKE 1 TABLET BY MOUTH EVERY DAY   feeding supplement, ENSURE COMPLETE, (ENSURE COMPLETE) LIQD Take 237 mLs by mouth 3 (three) times daily between meals. (Patient taking differently: Take 237 mLs by mouth See admin instructions. Once to twice daily)   geriatric multivitamins-minerals (ELDERTONIC/GEVRABON) LIQD Take 15 mLs by mouth daily.   HYDROcodone-acetaminophen (NORCO/VICODIN) 5-325 MG tablet Take 1 tablet by mouth every 4 (four) hours as needed.   lidocaine (LIDODERM) 5 % Place 1 patch onto the skin daily. Remove & Discard patch within 12 hours or as directed by MD   LIVALO 2 MG TABS TAKE 1 TABLET BY MOUTH EVERY DAY   metoprolol succinate (TOPROL-XL) 100 MG 24 hr tablet Take 1 tablet (100 mg total) by mouth every evening.   mirtazapine (REMERON SOL-TAB) 15 MG disintegrating tablet Take 1 tablet (15 mg total) by mouth at bedtime.   omega-3 acid ethyl esters (LOVAZA) 1 g capsule TAKE 1 CAPSULE (1 G TOTAL) BY MOUTH EVERY EVENING.   pantoprazole (PROTONIX) 40 MG tablet Take 1 tablet (40 mg total) by mouth daily as needed (for acid reflux).   sulfamethoxazole-trimethoprim (BACTRIM DS) 800-160 MG tablet Take 1 tablet by mouth 2 (two) times daily.   timolol (TIMOPTIC) 0.5 % ophthalmic solution Place 1 drop into both eyes 2 (two) times daily.    No facility-administered encounter medications on file as of 11/13/2021.  I spent 60 minutes providing this consultation; this includes time spent with patient/family, chart review and documentation. More than 50% of the time in this consultation was spent on counseling and coordinating communication  Thank you for the opportunity to participate in the care of Ms. Hassell Done.  The palliative care team will continue to follow. Please call our office at [phone] if we can be  of additional assistance.   Note: Portions of this note were generated with Lobbyist. Dictation errors may occur despite best attempts at proofreading.  Teodoro Spray, NP

## 2021-11-17 ENCOUNTER — Other Ambulatory Visit: Payer: Self-pay | Admitting: Cardiovascular Disease

## 2021-11-26 DIAGNOSIS — H401133 Primary open-angle glaucoma, bilateral, severe stage: Secondary | ICD-10-CM | POA: Diagnosis not present

## 2021-11-26 DIAGNOSIS — H02005 Unspecified entropion of left lower eyelid: Secondary | ICD-10-CM | POA: Diagnosis not present

## 2021-11-27 ENCOUNTER — Encounter: Payer: Self-pay | Admitting: Family Medicine

## 2021-11-27 ENCOUNTER — Other Ambulatory Visit: Payer: Self-pay

## 2021-11-27 DIAGNOSIS — R5381 Other malaise: Secondary | ICD-10-CM

## 2021-11-29 ENCOUNTER — Other Ambulatory Visit: Payer: Self-pay | Admitting: Cardiovascular Disease

## 2021-12-03 ENCOUNTER — Encounter: Payer: Self-pay | Admitting: Family Medicine

## 2021-12-03 ENCOUNTER — Other Ambulatory Visit: Payer: Self-pay

## 2021-12-03 ENCOUNTER — Telehealth (INDEPENDENT_AMBULATORY_CARE_PROVIDER_SITE_OTHER): Payer: Medicare Other | Admitting: Family Medicine

## 2021-12-03 VITALS — BP 140/82 | HR 60 | Temp 97.4°F | Resp 18

## 2021-12-03 DIAGNOSIS — R627 Adult failure to thrive: Secondary | ICD-10-CM | POA: Diagnosis not present

## 2021-12-03 DIAGNOSIS — H401133 Primary open-angle glaucoma, bilateral, severe stage: Secondary | ICD-10-CM | POA: Diagnosis not present

## 2021-12-03 DIAGNOSIS — R5381 Other malaise: Secondary | ICD-10-CM

## 2021-12-03 DIAGNOSIS — H5212 Myopia, left eye: Secondary | ICD-10-CM | POA: Diagnosis not present

## 2021-12-03 NOTE — Progress Notes (Signed)
° °  Subjective:    Patient ID: Julie Bass, female    DOB: March 14, 1926, 88 y.o.   MRN: [id number]  HPI Documentation for virtual audio and video telecommunications through [facility] encounter: The patient was located at home. 2 patient identifiers used.  The provider was located in the office. The patient did consent to this visit and is aware of possible charges through their insurance for this visit. The  person participating in this telemedicine service was her daughter, [name] Time spent on call was 5 minutes and in review of previous records >16 minutes total for counseling and coordination of care. This virtual service is not related to other E/M service within previous 7 days.  [name] states that her mother is sitting around all day and not making any effort to stay physically active.  Apparently she did fairly well when she had physical therapy coming by to help with this and would like to start this again.  Review of Systems     Objective:   Physical Exam Patient not examined       Assessment & Plan:  Physical deconditioning  Failure to thrive in adult I think it is reasonable to get physical therapy involved with this.

## 2021-12-13 DIAGNOSIS — Z87891 Personal history of nicotine dependence: Secondary | ICD-10-CM | POA: Diagnosis not present

## 2021-12-13 DIAGNOSIS — I442 Atrioventricular block, complete: Secondary | ICD-10-CM | POA: Diagnosis not present

## 2021-12-13 DIAGNOSIS — Z9181 History of falling: Secondary | ICD-10-CM | POA: Diagnosis not present

## 2021-12-13 DIAGNOSIS — N1831 Chronic kidney disease, stage 3a: Secondary | ICD-10-CM | POA: Diagnosis not present

## 2021-12-13 DIAGNOSIS — I129 Hypertensive chronic kidney disease with stage 1 through stage 4 chronic kidney disease, or unspecified chronic kidney disease: Secondary | ICD-10-CM | POA: Diagnosis not present

## 2021-12-13 DIAGNOSIS — R627 Adult failure to thrive: Secondary | ICD-10-CM | POA: Diagnosis not present

## 2021-12-13 DIAGNOSIS — F32A Depression, unspecified: Secondary | ICD-10-CM | POA: Diagnosis not present

## 2021-12-13 DIAGNOSIS — H903 Sensorineural hearing loss, bilateral: Secondary | ICD-10-CM | POA: Diagnosis not present

## 2021-12-13 DIAGNOSIS — M810 Age-related osteoporosis without current pathological fracture: Secondary | ICD-10-CM | POA: Diagnosis not present

## 2021-12-13 DIAGNOSIS — M19019 Primary osteoarthritis, unspecified shoulder: Secondary | ICD-10-CM | POA: Diagnosis not present

## 2021-12-13 DIAGNOSIS — E785 Hyperlipidemia, unspecified: Secondary | ICD-10-CM | POA: Diagnosis not present

## 2021-12-13 DIAGNOSIS — K219 Gastro-esophageal reflux disease without esophagitis: Secondary | ICD-10-CM | POA: Diagnosis not present

## 2021-12-13 DIAGNOSIS — I739 Peripheral vascular disease, unspecified: Secondary | ICD-10-CM | POA: Diagnosis not present

## 2021-12-13 DIAGNOSIS — F0393 Unspecified dementia, unspecified severity, with mood disturbance: Secondary | ICD-10-CM | POA: Diagnosis not present

## 2021-12-13 DIAGNOSIS — F0394 Unspecified dementia, unspecified severity, with anxiety: Secondary | ICD-10-CM | POA: Diagnosis not present

## 2021-12-13 DIAGNOSIS — K449 Diaphragmatic hernia without obstruction or gangrene: Secondary | ICD-10-CM | POA: Diagnosis not present

## 2021-12-13 DIAGNOSIS — H409 Unspecified glaucoma: Secondary | ICD-10-CM | POA: Diagnosis not present

## 2021-12-13 DIAGNOSIS — G4733 Obstructive sleep apnea (adult) (pediatric): Secondary | ICD-10-CM | POA: Diagnosis not present

## 2021-12-13 DIAGNOSIS — Z87440 Personal history of urinary (tract) infections: Secondary | ICD-10-CM | POA: Diagnosis not present

## 2021-12-14 ENCOUNTER — Ambulatory Visit: Payer: Medicare Other | Admitting: Podiatry

## 2021-12-16 ENCOUNTER — Encounter: Payer: Self-pay | Admitting: Podiatry

## 2021-12-16 ENCOUNTER — Ambulatory Visit: Payer: Medicare Other | Admitting: Podiatry

## 2021-12-16 ENCOUNTER — Other Ambulatory Visit: Payer: Self-pay

## 2021-12-16 DIAGNOSIS — M79609 Pain in unspecified limb: Secondary | ICD-10-CM

## 2021-12-16 DIAGNOSIS — B351 Tinea unguium: Secondary | ICD-10-CM

## 2021-12-16 NOTE — Progress Notes (Signed)
Subjective:   Patient ID: Julie Bass, female   DOB: 88 y.o.   MRN: [id number]   HPI Patient presents with thick yellow brittle nailbeds 1-5 both feet that she or caregivers cannot cut and she is in wheelchair neuro   ROS      Objective:  Physical Exam  Vascular status unchanged thick yellow brittle nailbeds 1-5 both feet very elongated and painful     Assessment:  Chronic mycotic nail infection 1-5 bilateral its not been taking care of for a long time     Plan:  Debridement of nailbeds 1-5 both feet no iatrogenic bleeding reappoint routine care

## 2021-12-17 DIAGNOSIS — I442 Atrioventricular block, complete: Secondary | ICD-10-CM | POA: Diagnosis not present

## 2021-12-17 DIAGNOSIS — H409 Unspecified glaucoma: Secondary | ICD-10-CM | POA: Diagnosis not present

## 2021-12-17 DIAGNOSIS — E785 Hyperlipidemia, unspecified: Secondary | ICD-10-CM | POA: Diagnosis not present

## 2021-12-17 DIAGNOSIS — K449 Diaphragmatic hernia without obstruction or gangrene: Secondary | ICD-10-CM | POA: Diagnosis not present

## 2021-12-17 DIAGNOSIS — M19019 Primary osteoarthritis, unspecified shoulder: Secondary | ICD-10-CM | POA: Diagnosis not present

## 2021-12-17 DIAGNOSIS — M810 Age-related osteoporosis without current pathological fracture: Secondary | ICD-10-CM | POA: Diagnosis not present

## 2021-12-17 DIAGNOSIS — I129 Hypertensive chronic kidney disease with stage 1 through stage 4 chronic kidney disease, or unspecified chronic kidney disease: Secondary | ICD-10-CM | POA: Diagnosis not present

## 2021-12-17 DIAGNOSIS — G4733 Obstructive sleep apnea (adult) (pediatric): Secondary | ICD-10-CM | POA: Diagnosis not present

## 2021-12-17 DIAGNOSIS — N1831 Chronic kidney disease, stage 3a: Secondary | ICD-10-CM | POA: Diagnosis not present

## 2021-12-17 DIAGNOSIS — F0394 Unspecified dementia, unspecified severity, with anxiety: Secondary | ICD-10-CM | POA: Diagnosis not present

## 2021-12-17 DIAGNOSIS — R627 Adult failure to thrive: Secondary | ICD-10-CM | POA: Diagnosis not present

## 2021-12-17 DIAGNOSIS — F32A Depression, unspecified: Secondary | ICD-10-CM | POA: Diagnosis not present

## 2021-12-17 DIAGNOSIS — Z87440 Personal history of urinary (tract) infections: Secondary | ICD-10-CM | POA: Diagnosis not present

## 2021-12-17 DIAGNOSIS — K219 Gastro-esophageal reflux disease without esophagitis: Secondary | ICD-10-CM | POA: Diagnosis not present

## 2021-12-17 DIAGNOSIS — Z87891 Personal history of nicotine dependence: Secondary | ICD-10-CM | POA: Diagnosis not present

## 2021-12-17 DIAGNOSIS — F0393 Unspecified dementia, unspecified severity, with mood disturbance: Secondary | ICD-10-CM | POA: Diagnosis not present

## 2021-12-17 DIAGNOSIS — H903 Sensorineural hearing loss, bilateral: Secondary | ICD-10-CM | POA: Diagnosis not present

## 2021-12-17 DIAGNOSIS — I739 Peripheral vascular disease, unspecified: Secondary | ICD-10-CM | POA: Diagnosis not present

## 2021-12-17 DIAGNOSIS — Z9181 History of falling: Secondary | ICD-10-CM | POA: Diagnosis not present

## 2021-12-21 DIAGNOSIS — I129 Hypertensive chronic kidney disease with stage 1 through stage 4 chronic kidney disease, or unspecified chronic kidney disease: Secondary | ICD-10-CM | POA: Diagnosis not present

## 2021-12-21 DIAGNOSIS — F0393 Unspecified dementia, unspecified severity, with mood disturbance: Secondary | ICD-10-CM | POA: Diagnosis not present

## 2021-12-21 DIAGNOSIS — N1831 Chronic kidney disease, stage 3a: Secondary | ICD-10-CM | POA: Diagnosis not present

## 2021-12-21 DIAGNOSIS — G4733 Obstructive sleep apnea (adult) (pediatric): Secondary | ICD-10-CM | POA: Diagnosis not present

## 2021-12-21 DIAGNOSIS — K219 Gastro-esophageal reflux disease without esophagitis: Secondary | ICD-10-CM | POA: Diagnosis not present

## 2021-12-21 DIAGNOSIS — I739 Peripheral vascular disease, unspecified: Secondary | ICD-10-CM | POA: Diagnosis not present

## 2021-12-21 DIAGNOSIS — I442 Atrioventricular block, complete: Secondary | ICD-10-CM | POA: Diagnosis not present

## 2021-12-21 DIAGNOSIS — F32A Depression, unspecified: Secondary | ICD-10-CM | POA: Diagnosis not present

## 2021-12-21 DIAGNOSIS — F0394 Unspecified dementia, unspecified severity, with anxiety: Secondary | ICD-10-CM | POA: Diagnosis not present

## 2021-12-21 DIAGNOSIS — R627 Adult failure to thrive: Secondary | ICD-10-CM | POA: Diagnosis not present

## 2021-12-21 DIAGNOSIS — Z87891 Personal history of nicotine dependence: Secondary | ICD-10-CM | POA: Diagnosis not present

## 2021-12-21 DIAGNOSIS — H903 Sensorineural hearing loss, bilateral: Secondary | ICD-10-CM | POA: Diagnosis not present

## 2021-12-21 DIAGNOSIS — K449 Diaphragmatic hernia without obstruction or gangrene: Secondary | ICD-10-CM | POA: Diagnosis not present

## 2021-12-21 DIAGNOSIS — Z87440 Personal history of urinary (tract) infections: Secondary | ICD-10-CM | POA: Diagnosis not present

## 2021-12-21 DIAGNOSIS — H409 Unspecified glaucoma: Secondary | ICD-10-CM | POA: Diagnosis not present

## 2021-12-21 DIAGNOSIS — M810 Age-related osteoporosis without current pathological fracture: Secondary | ICD-10-CM | POA: Diagnosis not present

## 2021-12-21 DIAGNOSIS — M19019 Primary osteoarthritis, unspecified shoulder: Secondary | ICD-10-CM | POA: Diagnosis not present

## 2021-12-21 DIAGNOSIS — Z9181 History of falling: Secondary | ICD-10-CM | POA: Diagnosis not present

## 2021-12-21 DIAGNOSIS — E785 Hyperlipidemia, unspecified: Secondary | ICD-10-CM | POA: Diagnosis not present

## 2021-12-24 ENCOUNTER — Other Ambulatory Visit: Payer: Self-pay | Admitting: Cardiovascular Disease

## 2021-12-24 DIAGNOSIS — N1831 Chronic kidney disease, stage 3a: Secondary | ICD-10-CM | POA: Diagnosis not present

## 2021-12-24 DIAGNOSIS — G4733 Obstructive sleep apnea (adult) (pediatric): Secondary | ICD-10-CM | POA: Diagnosis not present

## 2021-12-24 DIAGNOSIS — Z9181 History of falling: Secondary | ICD-10-CM | POA: Diagnosis not present

## 2021-12-24 DIAGNOSIS — Z87440 Personal history of urinary (tract) infections: Secondary | ICD-10-CM | POA: Diagnosis not present

## 2021-12-24 DIAGNOSIS — K449 Diaphragmatic hernia without obstruction or gangrene: Secondary | ICD-10-CM | POA: Diagnosis not present

## 2021-12-24 DIAGNOSIS — I739 Peripheral vascular disease, unspecified: Secondary | ICD-10-CM | POA: Diagnosis not present

## 2021-12-24 DIAGNOSIS — M810 Age-related osteoporosis without current pathological fracture: Secondary | ICD-10-CM | POA: Diagnosis not present

## 2021-12-24 DIAGNOSIS — I129 Hypertensive chronic kidney disease with stage 1 through stage 4 chronic kidney disease, or unspecified chronic kidney disease: Secondary | ICD-10-CM | POA: Diagnosis not present

## 2021-12-24 DIAGNOSIS — K219 Gastro-esophageal reflux disease without esophagitis: Secondary | ICD-10-CM | POA: Diagnosis not present

## 2021-12-24 DIAGNOSIS — M19019 Primary osteoarthritis, unspecified shoulder: Secondary | ICD-10-CM | POA: Diagnosis not present

## 2021-12-24 DIAGNOSIS — Z87891 Personal history of nicotine dependence: Secondary | ICD-10-CM | POA: Diagnosis not present

## 2021-12-24 DIAGNOSIS — F0393 Unspecified dementia, unspecified severity, with mood disturbance: Secondary | ICD-10-CM | POA: Diagnosis not present

## 2021-12-24 DIAGNOSIS — F32A Depression, unspecified: Secondary | ICD-10-CM | POA: Diagnosis not present

## 2021-12-24 DIAGNOSIS — F0394 Unspecified dementia, unspecified severity, with anxiety: Secondary | ICD-10-CM | POA: Diagnosis not present

## 2021-12-24 DIAGNOSIS — I442 Atrioventricular block, complete: Secondary | ICD-10-CM | POA: Diagnosis not present

## 2021-12-24 DIAGNOSIS — H903 Sensorineural hearing loss, bilateral: Secondary | ICD-10-CM | POA: Diagnosis not present

## 2021-12-24 DIAGNOSIS — H409 Unspecified glaucoma: Secondary | ICD-10-CM | POA: Diagnosis not present

## 2021-12-24 DIAGNOSIS — R627 Adult failure to thrive: Secondary | ICD-10-CM | POA: Diagnosis not present

## 2021-12-24 DIAGNOSIS — E785 Hyperlipidemia, unspecified: Secondary | ICD-10-CM | POA: Diagnosis not present

## 2021-12-28 ENCOUNTER — Ambulatory Visit (INDEPENDENT_AMBULATORY_CARE_PROVIDER_SITE_OTHER): Payer: Medicare Other

## 2021-12-28 DIAGNOSIS — I442 Atrioventricular block, complete: Secondary | ICD-10-CM

## 2021-12-28 LAB — CUP PACEART REMOTE DEVICE CHECK
Date Time Interrogation Session: 20230213121215
Implantable Lead Implant Date: 20190225
Implantable Lead Implant Date: 20190225
Implantable Lead Location: 753859
Implantable Lead Location: 753860
Implantable Lead Model: 377
Implantable Lead Model: 377
Implantable Lead Serial Number: 80514208
Implantable Lead Serial Number: 80598782
Implantable Pulse Generator Implant Date: 20190225
Pulse Gen Model: 407145
Pulse Gen Serial Number: 69245949

## 2021-12-29 DIAGNOSIS — M810 Age-related osteoporosis without current pathological fracture: Secondary | ICD-10-CM | POA: Diagnosis not present

## 2021-12-29 DIAGNOSIS — F0394 Unspecified dementia, unspecified severity, with anxiety: Secondary | ICD-10-CM | POA: Diagnosis not present

## 2021-12-29 DIAGNOSIS — Z9181 History of falling: Secondary | ICD-10-CM | POA: Diagnosis not present

## 2021-12-29 DIAGNOSIS — H409 Unspecified glaucoma: Secondary | ICD-10-CM | POA: Diagnosis not present

## 2021-12-29 DIAGNOSIS — Z87891 Personal history of nicotine dependence: Secondary | ICD-10-CM | POA: Diagnosis not present

## 2021-12-29 DIAGNOSIS — R627 Adult failure to thrive: Secondary | ICD-10-CM | POA: Diagnosis not present

## 2021-12-29 DIAGNOSIS — N1831 Chronic kidney disease, stage 3a: Secondary | ICD-10-CM | POA: Diagnosis not present

## 2021-12-29 DIAGNOSIS — I129 Hypertensive chronic kidney disease with stage 1 through stage 4 chronic kidney disease, or unspecified chronic kidney disease: Secondary | ICD-10-CM | POA: Diagnosis not present

## 2021-12-29 DIAGNOSIS — G4733 Obstructive sleep apnea (adult) (pediatric): Secondary | ICD-10-CM | POA: Diagnosis not present

## 2021-12-29 DIAGNOSIS — I442 Atrioventricular block, complete: Secondary | ICD-10-CM | POA: Diagnosis not present

## 2021-12-29 DIAGNOSIS — Z87440 Personal history of urinary (tract) infections: Secondary | ICD-10-CM | POA: Diagnosis not present

## 2021-12-29 DIAGNOSIS — K219 Gastro-esophageal reflux disease without esophagitis: Secondary | ICD-10-CM | POA: Diagnosis not present

## 2021-12-29 DIAGNOSIS — E785 Hyperlipidemia, unspecified: Secondary | ICD-10-CM | POA: Diagnosis not present

## 2021-12-29 DIAGNOSIS — F0393 Unspecified dementia, unspecified severity, with mood disturbance: Secondary | ICD-10-CM | POA: Diagnosis not present

## 2021-12-29 DIAGNOSIS — I739 Peripheral vascular disease, unspecified: Secondary | ICD-10-CM | POA: Diagnosis not present

## 2021-12-29 DIAGNOSIS — K449 Diaphragmatic hernia without obstruction or gangrene: Secondary | ICD-10-CM | POA: Diagnosis not present

## 2021-12-29 DIAGNOSIS — F32A Depression, unspecified: Secondary | ICD-10-CM | POA: Diagnosis not present

## 2021-12-29 DIAGNOSIS — M19019 Primary osteoarthritis, unspecified shoulder: Secondary | ICD-10-CM | POA: Diagnosis not present

## 2021-12-29 DIAGNOSIS — H903 Sensorineural hearing loss, bilateral: Secondary | ICD-10-CM | POA: Diagnosis not present

## 2021-12-31 ENCOUNTER — Telehealth: Payer: Self-pay

## 2021-12-31 DIAGNOSIS — M19019 Primary osteoarthritis, unspecified shoulder: Secondary | ICD-10-CM | POA: Diagnosis not present

## 2021-12-31 DIAGNOSIS — F0394 Unspecified dementia, unspecified severity, with anxiety: Secondary | ICD-10-CM | POA: Diagnosis not present

## 2021-12-31 DIAGNOSIS — I442 Atrioventricular block, complete: Secondary | ICD-10-CM | POA: Diagnosis not present

## 2021-12-31 DIAGNOSIS — I739 Peripheral vascular disease, unspecified: Secondary | ICD-10-CM | POA: Diagnosis not present

## 2021-12-31 DIAGNOSIS — I129 Hypertensive chronic kidney disease with stage 1 through stage 4 chronic kidney disease, or unspecified chronic kidney disease: Secondary | ICD-10-CM | POA: Diagnosis not present

## 2021-12-31 DIAGNOSIS — R627 Adult failure to thrive: Secondary | ICD-10-CM | POA: Diagnosis not present

## 2021-12-31 DIAGNOSIS — H903 Sensorineural hearing loss, bilateral: Secondary | ICD-10-CM | POA: Diagnosis not present

## 2021-12-31 DIAGNOSIS — K219 Gastro-esophageal reflux disease without esophagitis: Secondary | ICD-10-CM | POA: Diagnosis not present

## 2021-12-31 DIAGNOSIS — Z87440 Personal history of urinary (tract) infections: Secondary | ICD-10-CM | POA: Diagnosis not present

## 2021-12-31 DIAGNOSIS — F32A Depression, unspecified: Secondary | ICD-10-CM | POA: Diagnosis not present

## 2021-12-31 DIAGNOSIS — Z9181 History of falling: Secondary | ICD-10-CM | POA: Diagnosis not present

## 2021-12-31 DIAGNOSIS — Z87891 Personal history of nicotine dependence: Secondary | ICD-10-CM | POA: Diagnosis not present

## 2021-12-31 DIAGNOSIS — N1831 Chronic kidney disease, stage 3a: Secondary | ICD-10-CM | POA: Diagnosis not present

## 2021-12-31 DIAGNOSIS — E785 Hyperlipidemia, unspecified: Secondary | ICD-10-CM | POA: Diagnosis not present

## 2021-12-31 DIAGNOSIS — K449 Diaphragmatic hernia without obstruction or gangrene: Secondary | ICD-10-CM | POA: Diagnosis not present

## 2021-12-31 DIAGNOSIS — M810 Age-related osteoporosis without current pathological fracture: Secondary | ICD-10-CM | POA: Diagnosis not present

## 2021-12-31 DIAGNOSIS — H409 Unspecified glaucoma: Secondary | ICD-10-CM | POA: Diagnosis not present

## 2021-12-31 DIAGNOSIS — F0393 Unspecified dementia, unspecified severity, with mood disturbance: Secondary | ICD-10-CM | POA: Diagnosis not present

## 2021-12-31 DIAGNOSIS — G4733 Obstructive sleep apnea (adult) (pediatric): Secondary | ICD-10-CM | POA: Diagnosis not present

## 2021-12-31 NOTE — Progress Notes (Signed)
Remote pacemaker transmission.   

## 2021-12-31 NOTE — Telephone Encounter (Signed)
PT for 1 for 4 weeks please  advise . [place]

## 2022-01-01 DIAGNOSIS — Z87440 Personal history of urinary (tract) infections: Secondary | ICD-10-CM | POA: Diagnosis not present

## 2022-01-01 DIAGNOSIS — H903 Sensorineural hearing loss, bilateral: Secondary | ICD-10-CM | POA: Diagnosis not present

## 2022-01-01 DIAGNOSIS — N1831 Chronic kidney disease, stage 3a: Secondary | ICD-10-CM | POA: Diagnosis not present

## 2022-01-01 DIAGNOSIS — I442 Atrioventricular block, complete: Secondary | ICD-10-CM | POA: Diagnosis not present

## 2022-01-01 DIAGNOSIS — H409 Unspecified glaucoma: Secondary | ICD-10-CM | POA: Diagnosis not present

## 2022-01-01 DIAGNOSIS — R627 Adult failure to thrive: Secondary | ICD-10-CM | POA: Diagnosis not present

## 2022-01-01 DIAGNOSIS — M810 Age-related osteoporosis without current pathological fracture: Secondary | ICD-10-CM | POA: Diagnosis not present

## 2022-01-01 DIAGNOSIS — G4733 Obstructive sleep apnea (adult) (pediatric): Secondary | ICD-10-CM | POA: Diagnosis not present

## 2022-01-01 DIAGNOSIS — F32A Depression, unspecified: Secondary | ICD-10-CM | POA: Diagnosis not present

## 2022-01-01 DIAGNOSIS — E785 Hyperlipidemia, unspecified: Secondary | ICD-10-CM | POA: Diagnosis not present

## 2022-01-01 DIAGNOSIS — Z9181 History of falling: Secondary | ICD-10-CM | POA: Diagnosis not present

## 2022-01-01 DIAGNOSIS — Z87891 Personal history of nicotine dependence: Secondary | ICD-10-CM | POA: Diagnosis not present

## 2022-01-01 DIAGNOSIS — K219 Gastro-esophageal reflux disease without esophagitis: Secondary | ICD-10-CM | POA: Diagnosis not present

## 2022-01-01 DIAGNOSIS — K449 Diaphragmatic hernia without obstruction or gangrene: Secondary | ICD-10-CM | POA: Diagnosis not present

## 2022-01-01 DIAGNOSIS — I739 Peripheral vascular disease, unspecified: Secondary | ICD-10-CM | POA: Diagnosis not present

## 2022-01-01 DIAGNOSIS — M19019 Primary osteoarthritis, unspecified shoulder: Secondary | ICD-10-CM | POA: Diagnosis not present

## 2022-01-01 DIAGNOSIS — I129 Hypertensive chronic kidney disease with stage 1 through stage 4 chronic kidney disease, or unspecified chronic kidney disease: Secondary | ICD-10-CM | POA: Diagnosis not present

## 2022-01-01 DIAGNOSIS — F0394 Unspecified dementia, unspecified severity, with anxiety: Secondary | ICD-10-CM | POA: Diagnosis not present

## 2022-01-01 DIAGNOSIS — F0393 Unspecified dementia, unspecified severity, with mood disturbance: Secondary | ICD-10-CM | POA: Diagnosis not present

## 2022-01-04 NOTE — Telephone Encounter (Signed)
Done [facility] 

## 2022-01-07 DIAGNOSIS — M19019 Primary osteoarthritis, unspecified shoulder: Secondary | ICD-10-CM | POA: Diagnosis not present

## 2022-01-07 DIAGNOSIS — M810 Age-related osteoporosis without current pathological fracture: Secondary | ICD-10-CM | POA: Diagnosis not present

## 2022-01-07 DIAGNOSIS — Z87440 Personal history of urinary (tract) infections: Secondary | ICD-10-CM | POA: Diagnosis not present

## 2022-01-07 DIAGNOSIS — I739 Peripheral vascular disease, unspecified: Secondary | ICD-10-CM | POA: Diagnosis not present

## 2022-01-07 DIAGNOSIS — F0394 Unspecified dementia, unspecified severity, with anxiety: Secondary | ICD-10-CM | POA: Diagnosis not present

## 2022-01-07 DIAGNOSIS — Z9181 History of falling: Secondary | ICD-10-CM | POA: Diagnosis not present

## 2022-01-07 DIAGNOSIS — F0393 Unspecified dementia, unspecified severity, with mood disturbance: Secondary | ICD-10-CM | POA: Diagnosis not present

## 2022-01-07 DIAGNOSIS — H409 Unspecified glaucoma: Secondary | ICD-10-CM | POA: Diagnosis not present

## 2022-01-07 DIAGNOSIS — I129 Hypertensive chronic kidney disease with stage 1 through stage 4 chronic kidney disease, or unspecified chronic kidney disease: Secondary | ICD-10-CM | POA: Diagnosis not present

## 2022-01-07 DIAGNOSIS — E785 Hyperlipidemia, unspecified: Secondary | ICD-10-CM | POA: Diagnosis not present

## 2022-01-07 DIAGNOSIS — F32A Depression, unspecified: Secondary | ICD-10-CM | POA: Diagnosis not present

## 2022-01-07 DIAGNOSIS — K449 Diaphragmatic hernia without obstruction or gangrene: Secondary | ICD-10-CM | POA: Diagnosis not present

## 2022-01-07 DIAGNOSIS — N1831 Chronic kidney disease, stage 3a: Secondary | ICD-10-CM | POA: Diagnosis not present

## 2022-01-07 DIAGNOSIS — K219 Gastro-esophageal reflux disease without esophagitis: Secondary | ICD-10-CM | POA: Diagnosis not present

## 2022-01-07 DIAGNOSIS — H903 Sensorineural hearing loss, bilateral: Secondary | ICD-10-CM | POA: Diagnosis not present

## 2022-01-07 DIAGNOSIS — G4733 Obstructive sleep apnea (adult) (pediatric): Secondary | ICD-10-CM | POA: Diagnosis not present

## 2022-01-07 DIAGNOSIS — I442 Atrioventricular block, complete: Secondary | ICD-10-CM | POA: Diagnosis not present

## 2022-01-07 DIAGNOSIS — R627 Adult failure to thrive: Secondary | ICD-10-CM | POA: Diagnosis not present

## 2022-01-07 DIAGNOSIS — Z87891 Personal history of nicotine dependence: Secondary | ICD-10-CM | POA: Diagnosis not present

## 2022-01-14 DIAGNOSIS — F0393 Unspecified dementia, unspecified severity, with mood disturbance: Secondary | ICD-10-CM | POA: Diagnosis not present

## 2022-01-14 DIAGNOSIS — Z87440 Personal history of urinary (tract) infections: Secondary | ICD-10-CM | POA: Diagnosis not present

## 2022-01-14 DIAGNOSIS — F0394 Unspecified dementia, unspecified severity, with anxiety: Secondary | ICD-10-CM | POA: Diagnosis not present

## 2022-01-14 DIAGNOSIS — H903 Sensorineural hearing loss, bilateral: Secondary | ICD-10-CM | POA: Diagnosis not present

## 2022-01-14 DIAGNOSIS — G4733 Obstructive sleep apnea (adult) (pediatric): Secondary | ICD-10-CM | POA: Diagnosis not present

## 2022-01-14 DIAGNOSIS — F32A Depression, unspecified: Secondary | ICD-10-CM | POA: Diagnosis not present

## 2022-01-14 DIAGNOSIS — I442 Atrioventricular block, complete: Secondary | ICD-10-CM | POA: Diagnosis not present

## 2022-01-14 DIAGNOSIS — R627 Adult failure to thrive: Secondary | ICD-10-CM | POA: Diagnosis not present

## 2022-01-14 DIAGNOSIS — Z87891 Personal history of nicotine dependence: Secondary | ICD-10-CM | POA: Diagnosis not present

## 2022-01-14 DIAGNOSIS — I129 Hypertensive chronic kidney disease with stage 1 through stage 4 chronic kidney disease, or unspecified chronic kidney disease: Secondary | ICD-10-CM | POA: Diagnosis not present

## 2022-01-14 DIAGNOSIS — Z9181 History of falling: Secondary | ICD-10-CM | POA: Diagnosis not present

## 2022-01-14 DIAGNOSIS — E785 Hyperlipidemia, unspecified: Secondary | ICD-10-CM | POA: Diagnosis not present

## 2022-01-14 DIAGNOSIS — K449 Diaphragmatic hernia without obstruction or gangrene: Secondary | ICD-10-CM | POA: Diagnosis not present

## 2022-01-14 DIAGNOSIS — M19019 Primary osteoarthritis, unspecified shoulder: Secondary | ICD-10-CM | POA: Diagnosis not present

## 2022-01-14 DIAGNOSIS — N1831 Chronic kidney disease, stage 3a: Secondary | ICD-10-CM | POA: Diagnosis not present

## 2022-01-14 DIAGNOSIS — I739 Peripheral vascular disease, unspecified: Secondary | ICD-10-CM | POA: Diagnosis not present

## 2022-01-14 DIAGNOSIS — H409 Unspecified glaucoma: Secondary | ICD-10-CM | POA: Diagnosis not present

## 2022-01-14 DIAGNOSIS — M810 Age-related osteoporosis without current pathological fracture: Secondary | ICD-10-CM | POA: Diagnosis not present

## 2022-01-14 DIAGNOSIS — K219 Gastro-esophageal reflux disease without esophagitis: Secondary | ICD-10-CM | POA: Diagnosis not present

## 2022-01-17 ENCOUNTER — Other Ambulatory Visit: Payer: Self-pay | Admitting: Cardiovascular Disease

## 2022-01-21 DIAGNOSIS — Z9181 History of falling: Secondary | ICD-10-CM | POA: Diagnosis not present

## 2022-01-21 DIAGNOSIS — Z87440 Personal history of urinary (tract) infections: Secondary | ICD-10-CM | POA: Diagnosis not present

## 2022-01-21 DIAGNOSIS — K219 Gastro-esophageal reflux disease without esophagitis: Secondary | ICD-10-CM | POA: Diagnosis not present

## 2022-01-21 DIAGNOSIS — F32A Depression, unspecified: Secondary | ICD-10-CM | POA: Diagnosis not present

## 2022-01-21 DIAGNOSIS — I442 Atrioventricular block, complete: Secondary | ICD-10-CM | POA: Diagnosis not present

## 2022-01-21 DIAGNOSIS — E785 Hyperlipidemia, unspecified: Secondary | ICD-10-CM | POA: Diagnosis not present

## 2022-01-21 DIAGNOSIS — R627 Adult failure to thrive: Secondary | ICD-10-CM | POA: Diagnosis not present

## 2022-01-21 DIAGNOSIS — N1831 Chronic kidney disease, stage 3a: Secondary | ICD-10-CM | POA: Diagnosis not present

## 2022-01-21 DIAGNOSIS — F0394 Unspecified dementia, unspecified severity, with anxiety: Secondary | ICD-10-CM | POA: Diagnosis not present

## 2022-01-21 DIAGNOSIS — M19019 Primary osteoarthritis, unspecified shoulder: Secondary | ICD-10-CM | POA: Diagnosis not present

## 2022-01-21 DIAGNOSIS — M810 Age-related osteoporosis without current pathological fracture: Secondary | ICD-10-CM | POA: Diagnosis not present

## 2022-01-21 DIAGNOSIS — I129 Hypertensive chronic kidney disease with stage 1 through stage 4 chronic kidney disease, or unspecified chronic kidney disease: Secondary | ICD-10-CM | POA: Diagnosis not present

## 2022-01-21 DIAGNOSIS — H409 Unspecified glaucoma: Secondary | ICD-10-CM | POA: Diagnosis not present

## 2022-01-21 DIAGNOSIS — Z87891 Personal history of nicotine dependence: Secondary | ICD-10-CM | POA: Diagnosis not present

## 2022-01-21 DIAGNOSIS — I739 Peripheral vascular disease, unspecified: Secondary | ICD-10-CM | POA: Diagnosis not present

## 2022-01-21 DIAGNOSIS — G4733 Obstructive sleep apnea (adult) (pediatric): Secondary | ICD-10-CM | POA: Diagnosis not present

## 2022-01-21 DIAGNOSIS — F0393 Unspecified dementia, unspecified severity, with mood disturbance: Secondary | ICD-10-CM | POA: Diagnosis not present

## 2022-01-21 DIAGNOSIS — H903 Sensorineural hearing loss, bilateral: Secondary | ICD-10-CM | POA: Diagnosis not present

## 2022-01-21 DIAGNOSIS — K449 Diaphragmatic hernia without obstruction or gangrene: Secondary | ICD-10-CM | POA: Diagnosis not present

## 2022-01-25 DIAGNOSIS — F32A Depression, unspecified: Secondary | ICD-10-CM | POA: Diagnosis not present

## 2022-01-25 DIAGNOSIS — F0394 Unspecified dementia, unspecified severity, with anxiety: Secondary | ICD-10-CM | POA: Diagnosis not present

## 2022-01-25 DIAGNOSIS — M19019 Primary osteoarthritis, unspecified shoulder: Secondary | ICD-10-CM | POA: Diagnosis not present

## 2022-01-25 DIAGNOSIS — I442 Atrioventricular block, complete: Secondary | ICD-10-CM | POA: Diagnosis not present

## 2022-01-25 DIAGNOSIS — Z87891 Personal history of nicotine dependence: Secondary | ICD-10-CM | POA: Diagnosis not present

## 2022-01-25 DIAGNOSIS — Z9181 History of falling: Secondary | ICD-10-CM | POA: Diagnosis not present

## 2022-01-25 DIAGNOSIS — K219 Gastro-esophageal reflux disease without esophagitis: Secondary | ICD-10-CM | POA: Diagnosis not present

## 2022-01-25 DIAGNOSIS — Z87440 Personal history of urinary (tract) infections: Secondary | ICD-10-CM | POA: Diagnosis not present

## 2022-01-25 DIAGNOSIS — I739 Peripheral vascular disease, unspecified: Secondary | ICD-10-CM | POA: Diagnosis not present

## 2022-01-25 DIAGNOSIS — K449 Diaphragmatic hernia without obstruction or gangrene: Secondary | ICD-10-CM | POA: Diagnosis not present

## 2022-01-25 DIAGNOSIS — R627 Adult failure to thrive: Secondary | ICD-10-CM | POA: Diagnosis not present

## 2022-01-25 DIAGNOSIS — F0393 Unspecified dementia, unspecified severity, with mood disturbance: Secondary | ICD-10-CM | POA: Diagnosis not present

## 2022-01-25 DIAGNOSIS — H903 Sensorineural hearing loss, bilateral: Secondary | ICD-10-CM | POA: Diagnosis not present

## 2022-01-25 DIAGNOSIS — H409 Unspecified glaucoma: Secondary | ICD-10-CM | POA: Diagnosis not present

## 2022-01-25 DIAGNOSIS — I129 Hypertensive chronic kidney disease with stage 1 through stage 4 chronic kidney disease, or unspecified chronic kidney disease: Secondary | ICD-10-CM | POA: Diagnosis not present

## 2022-01-25 DIAGNOSIS — E785 Hyperlipidemia, unspecified: Secondary | ICD-10-CM | POA: Diagnosis not present

## 2022-01-25 DIAGNOSIS — N1831 Chronic kidney disease, stage 3a: Secondary | ICD-10-CM | POA: Diagnosis not present

## 2022-01-25 DIAGNOSIS — M810 Age-related osteoporosis without current pathological fracture: Secondary | ICD-10-CM | POA: Diagnosis not present

## 2022-01-25 DIAGNOSIS — G4733 Obstructive sleep apnea (adult) (pediatric): Secondary | ICD-10-CM | POA: Diagnosis not present

## 2022-01-28 ENCOUNTER — Other Ambulatory Visit: Payer: Self-pay | Admitting: Family Medicine

## 2022-01-28 NOTE — Telephone Encounter (Signed)
[facility] to [facility] pt remeron. Please advise [place] ?

## 2022-02-01 ENCOUNTER — Other Ambulatory Visit: Payer: Self-pay | Admitting: Cardiovascular Disease

## 2022-02-22 ENCOUNTER — Encounter: Payer: Self-pay | Admitting: Family Medicine

## 2022-02-22 MED ORDER — SULFAMETHOXAZOLE-TRIMETHOPRIM 800-160 MG PO TABS: 1.0000 | ORAL_TABLET | Freq: Two times a day (BID) | ORAL | 0 refills | Status: DC

## 2022-02-26 ENCOUNTER — Other Ambulatory Visit: Payer: Self-pay | Admitting: Cardiovascular Disease

## 2022-03-01 DIAGNOSIS — H401133 Primary open-angle glaucoma, bilateral, severe stage: Secondary | ICD-10-CM | POA: Diagnosis not present

## 2022-03-11 ENCOUNTER — Telehealth: Payer: Self-pay | Admitting: Family Medicine

## 2022-03-11 NOTE — Telephone Encounter (Signed)
No answer unable to leave a messagefor patient to call back and schedule Medicare Annual Wellness Visit (AWV) in office. ? ? ? ?If unable to come into the office for AWV,  please offer to do virtually or by telephone. ? ? ? ?Due for due 11/15/09 AWVI  per [facility]    ? ? ? ?Please schedule at anytime with PFM-Nurse Health Advisor. ? ? ? ?30 minute appointment for Virtual or phone ?45 minute appointment for in office or Initial virtual/phone ? ? ? ?Any questions, please contact me at [phone] ?

## 2022-03-15 ENCOUNTER — Ambulatory Visit: Payer: Medicare Other | Admitting: Podiatry

## 2022-03-29 ENCOUNTER — Ambulatory Visit (INDEPENDENT_AMBULATORY_CARE_PROVIDER_SITE_OTHER): Payer: Medicare Other

## 2022-03-29 DIAGNOSIS — I442 Atrioventricular block, complete: Secondary | ICD-10-CM

## 2022-03-30 LAB — CUP PACEART REMOTE DEVICE CHECK
Date Time Interrogation Session: 20230516093357
Implantable Lead Implant Date: 20190225
Implantable Lead Implant Date: 20190225
Implantable Lead Location: 753859
Implantable Lead Location: 753860
Implantable Lead Model: 377
Implantable Lead Model: 377
Implantable Lead Serial Number: 80514208
Implantable Lead Serial Number: 80598782
Implantable Pulse Generator Implant Date: 20190225
Pulse Gen Model: 407145
Pulse Gen Serial Number: 69245949

## 2022-04-06 ENCOUNTER — Telehealth: Payer: Self-pay | Admitting: Family Medicine

## 2022-04-06 NOTE — Telephone Encounter (Signed)
Left message for patient to call back and schedule Medicare Annual Wellness Visit (AWV) either virtually or in office. I left my number for patient to call [phone].  due 11/15/09 AWVI  per [facility]     please schedule at anytime with health coach  This should be a 45 minute visit.

## 2022-04-16 NOTE — Progress Notes (Signed)
Remote pacemaker transmission.   

## 2022-04-21 ENCOUNTER — Other Ambulatory Visit: Payer: Self-pay

## 2022-04-21 MED ORDER — SULFAMETHOXAZOLE-TRIMETHOPRIM 800-160 MG PO TABS: 1.0000 | ORAL_TABLET | Freq: Two times a day (BID) | ORAL | 0 refills | Status: DC

## 2022-04-27 DIAGNOSIS — H401133 Primary open-angle glaucoma, bilateral, severe stage: Secondary | ICD-10-CM | POA: Diagnosis not present

## 2022-05-03 ENCOUNTER — Telehealth: Payer: Self-pay

## 2022-05-03 ENCOUNTER — Ambulatory Visit: Payer: Medicare Other | Admitting: Family Medicine

## 2022-05-03 ENCOUNTER — Ambulatory Visit (INDEPENDENT_AMBULATORY_CARE_PROVIDER_SITE_OTHER): Payer: Medicare Other | Admitting: Family Medicine

## 2022-05-03 ENCOUNTER — Encounter: Payer: Self-pay | Admitting: Family Medicine

## 2022-05-03 VITALS — BP 146/70 | HR 71 | Temp 97.5°F | Wt 102.2 lb

## 2022-05-03 DIAGNOSIS — K118 Other diseases of salivary glands: Secondary | ICD-10-CM

## 2022-05-03 DIAGNOSIS — R5383 Other fatigue: Secondary | ICD-10-CM | POA: Diagnosis not present

## 2022-05-03 DIAGNOSIS — R7301 Impaired fasting glucose: Secondary | ICD-10-CM | POA: Diagnosis not present

## 2022-05-03 NOTE — Telephone Encounter (Signed)
Called to find out about her eval and treatment. Pt daughter was concerned that she has not hear from them in about six months. Lvm for Channing to call back. [place]

## 2022-05-03 NOTE — Progress Notes (Signed)
   Subjective:    Patient ID: Julie Bass, female    DOB: 1926/10/19, 88 y.o.   MRN: [id number]  HPI She is here for an interval evaluation.  Her son-in-law is here with her.  He states that there is no particular issues going on other than occasional bouts of loose stools.  She has also had previous bouts of UTI.  Presently she offers no complaints other than general aches and pains and fatigue..   Review of Systems     Objective:   Physical Exam Alert and in no distress.  With a flat affect.  Tympanic membranes and canals are normal. Pharyngeal area is normal. Neck is supple without adenopathy or thyromegaly, 4 x 5 cm round smooth cystic lesion is noted in the left submandibular area.. Cardiac exam shows a regular sinus rhythm without murmurs or gallops. Lungs are clear to auscultation.        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  I will do routine blood screening to be safe.

## 2022-05-04 LAB — CBC WITH DIFFERENTIAL/PLATELET
Basophils Absolute: 0 10*3/uL (ref 0.0–0.2)
Basos: 1 %
EOS (ABSOLUTE): 0.1 10*3/uL (ref 0.0–0.4)
Eos: 3 %
Hematocrit: 41.7 % (ref 34.0–46.6)
Hemoglobin: 14.3 g/dL (ref 11.1–15.9)
Immature Grans (Abs): 0 10*3/uL (ref 0.0–0.1)
Immature Granulocytes: 0 %
Lymphocytes Absolute: 1.8 10*3/uL (ref 0.7–3.1)
Lymphs: 33 %
MCH: 32.5 pg (ref 26.6–33.0)
MCHC: 34.3 g/dL (ref 31.5–35.7)
MCV: 95 fL (ref 79–97)
Monocytes Absolute: 0.5 10*3/uL (ref 0.1–0.9)
Monocytes: 8 %
Neutrophils Absolute: 3 10*3/uL (ref 1.4–7.0)
Neutrophils: 55 %
Platelets: 288 10*3/uL (ref 150–450)
RBC: 4.4 x10E6/uL (ref 3.77–5.28)
RDW: 12.3 % (ref 11.7–15.4)
WBC: 5.5 10*3/uL (ref 3.4–10.8)

## 2022-05-04 LAB — COMPREHENSIVE METABOLIC PANEL
ALT: 48 IU/L — ABNORMAL HIGH (ref 0–32)
AST: 43 IU/L — ABNORMAL HIGH (ref 0–40)
Albumin/Globulin Ratio: 1.5 (ref 1.2–2.2)
Albumin: 4.5 g/dL (ref 3.5–4.6)
Alkaline Phosphatase: 73 IU/L (ref 44–121)
BUN/Creatinine Ratio: 11 — ABNORMAL LOW (ref 12–28)
BUN: 14 mg/dL (ref 10–36)
Bilirubin Total: 0.3 mg/dL (ref 0.0–1.2)
CO2: 19 mmol/L — ABNORMAL LOW (ref 20–29)
Calcium: 10.4 mg/dL — ABNORMAL HIGH (ref 8.7–10.3)
Chloride: 104 mmol/L (ref 96–106)
Creatinine, Ser: 1.33 mg/dL — ABNORMAL HIGH (ref 0.57–1.00)
Globulin, Total: 3 g/dL (ref 1.5–4.5)
Glucose: 183 mg/dL — ABNORMAL HIGH (ref 70–99)
Potassium: 4.7 mmol/L (ref 3.5–5.2)
Sodium: 141 mmol/L (ref 134–144)
Total Protein: 7.5 g/dL (ref 6.0–8.5)
eGFR: 37 mL/min/{1.73_m2} — ABNORMAL LOW (ref >59)

## 2022-05-04 LAB — TSH: TSH: 3.23 u[IU]/mL (ref 0.450–4.500)

## 2022-05-06 LAB — SPECIMEN STATUS REPORT

## 2022-05-06 LAB — HGB A1C W/O EAG: Hgb A1c MFr Bld: 5.1 % (ref 4.8–5.6)

## 2022-05-10 ENCOUNTER — Other Ambulatory Visit: Payer: Medicare Other | Admitting: Hospice

## 2022-05-10 DIAGNOSIS — I10 Essential (primary) hypertension: Secondary | ICD-10-CM | POA: Diagnosis not present

## 2022-05-10 DIAGNOSIS — F0390 Unspecified dementia without behavioral disturbance: Secondary | ICD-10-CM

## 2022-05-10 DIAGNOSIS — R1310 Dysphagia, unspecified: Secondary | ICD-10-CM

## 2022-05-10 DIAGNOSIS — I739 Peripheral vascular disease, unspecified: Secondary | ICD-10-CM

## 2022-05-10 DIAGNOSIS — R634 Abnormal weight loss: Secondary | ICD-10-CM

## 2022-05-10 DIAGNOSIS — Z515 Encounter for palliative care: Secondary | ICD-10-CM

## 2022-05-10 DIAGNOSIS — K5901 Slow transit constipation: Secondary | ICD-10-CM

## 2022-05-11 ENCOUNTER — Other Ambulatory Visit: Payer: Self-pay | Admitting: Cardiovascular Disease

## 2022-05-24 ENCOUNTER — Other Ambulatory Visit: Payer: Self-pay

## 2022-05-24 ENCOUNTER — Emergency Department (HOSPITAL_COMMUNITY): Payer: Medicare Other

## 2022-05-24 ENCOUNTER — Inpatient Hospital Stay (HOSPITAL_COMMUNITY)
Admission: EM | Admit: 2022-05-24 | Discharge: 2022-05-28 | DRG: 071 | Disposition: A | Discharge status: Hospice | Payer: Medicare Other | Attending: Internal Medicine | Admitting: Internal Medicine

## 2022-05-24 ENCOUNTER — Encounter (HOSPITAL_COMMUNITY): Payer: Self-pay

## 2022-05-24 DIAGNOSIS — R778 Other specified abnormalities of plasma proteins: Secondary | ICD-10-CM | POA: Diagnosis present

## 2022-05-24 DIAGNOSIS — I472 Ventricular tachycardia, unspecified: Secondary | ICD-10-CM | POA: Diagnosis not present

## 2022-05-24 DIAGNOSIS — Z66 Do not resuscitate: Secondary | ICD-10-CM | POA: Diagnosis present

## 2022-05-24 DIAGNOSIS — G9341 Metabolic encephalopathy: Principal | ICD-10-CM | POA: Diagnosis present

## 2022-05-24 DIAGNOSIS — Z96611 Presence of right artificial shoulder joint: Secondary | ICD-10-CM | POA: Diagnosis not present

## 2022-05-24 DIAGNOSIS — R6889 Other general symptoms and signs: Secondary | ICD-10-CM | POA: Diagnosis not present

## 2022-05-24 DIAGNOSIS — Z79899 Other long term (current) drug therapy: Secondary | ICD-10-CM

## 2022-05-24 DIAGNOSIS — I4430 Unspecified atrioventricular block: Secondary | ICD-10-CM | POA: Diagnosis present

## 2022-05-24 DIAGNOSIS — E785 Hyperlipidemia, unspecified: Secondary | ICD-10-CM | POA: Diagnosis not present

## 2022-05-24 DIAGNOSIS — R008 Other abnormalities of heart beat: Secondary | ICD-10-CM | POA: Diagnosis not present

## 2022-05-24 DIAGNOSIS — R627 Adult failure to thrive: Secondary | ICD-10-CM | POA: Diagnosis not present

## 2022-05-24 DIAGNOSIS — R5381 Other malaise: Secondary | ICD-10-CM | POA: Diagnosis not present

## 2022-05-24 DIAGNOSIS — I739 Peripheral vascular disease, unspecified: Secondary | ICD-10-CM | POA: Diagnosis present

## 2022-05-24 DIAGNOSIS — H409 Unspecified glaucoma: Secondary | ICD-10-CM | POA: Diagnosis present

## 2022-05-24 DIAGNOSIS — Z515 Encounter for palliative care: Secondary | ICD-10-CM

## 2022-05-24 DIAGNOSIS — M810 Age-related osteoporosis without current pathological fracture: Secondary | ICD-10-CM | POA: Diagnosis present

## 2022-05-24 DIAGNOSIS — G9340 Encephalopathy, unspecified: Secondary | ICD-10-CM | POA: Diagnosis not present

## 2022-05-24 DIAGNOSIS — I160 Hypertensive urgency: Secondary | ICD-10-CM | POA: Diagnosis present

## 2022-05-24 DIAGNOSIS — R54 Age-related physical debility: Secondary | ICD-10-CM | POA: Diagnosis present

## 2022-05-24 DIAGNOSIS — F32A Depression, unspecified: Secondary | ICD-10-CM | POA: Diagnosis not present

## 2022-05-24 DIAGNOSIS — I340 Nonrheumatic mitral (valve) insufficiency: Secondary | ICD-10-CM | POA: Diagnosis present

## 2022-05-24 DIAGNOSIS — R4182 Altered mental status, unspecified: Principal | ICD-10-CM

## 2022-05-24 DIAGNOSIS — I129 Hypertensive chronic kidney disease with stage 1 through stage 4 chronic kidney disease, or unspecified chronic kidney disease: Secondary | ICD-10-CM | POA: Diagnosis present

## 2022-05-24 DIAGNOSIS — R109 Unspecified abdominal pain: Secondary | ICD-10-CM | POA: Diagnosis not present

## 2022-05-24 DIAGNOSIS — Z95828 Presence of other vascular implants and grafts: Secondary | ICD-10-CM

## 2022-05-24 DIAGNOSIS — Z8249 Family history of ischemic heart disease and other diseases of the circulatory system: Secondary | ICD-10-CM | POA: Diagnosis not present

## 2022-05-24 DIAGNOSIS — Z87440 Personal history of urinary (tract) infections: Secondary | ICD-10-CM

## 2022-05-24 DIAGNOSIS — Z87891 Personal history of nicotine dependence: Secondary | ICD-10-CM

## 2022-05-24 DIAGNOSIS — R531 Weakness: Secondary | ICD-10-CM | POA: Diagnosis not present

## 2022-05-24 DIAGNOSIS — E7800 Pure hypercholesterolemia, unspecified: Secondary | ICD-10-CM | POA: Diagnosis not present

## 2022-05-24 DIAGNOSIS — R7989 Other specified abnormal findings of blood chemistry: Secondary | ICD-10-CM | POA: Diagnosis not present

## 2022-05-24 DIAGNOSIS — E876 Hypokalemia: Secondary | ICD-10-CM | POA: Diagnosis not present

## 2022-05-24 DIAGNOSIS — N183 Chronic kidney disease, stage 3 unspecified: Secondary | ICD-10-CM | POA: Diagnosis present

## 2022-05-24 DIAGNOSIS — Z888 Allergy status to other drugs, medicaments and biological substances status: Secondary | ICD-10-CM

## 2022-05-24 DIAGNOSIS — F0390 Unspecified dementia without behavioral disturbance: Secondary | ICD-10-CM | POA: Diagnosis present

## 2022-05-24 DIAGNOSIS — K219 Gastro-esophageal reflux disease without esophagitis: Secondary | ICD-10-CM | POA: Diagnosis not present

## 2022-05-24 DIAGNOSIS — N390 Urinary tract infection, site not specified: Secondary | ICD-10-CM

## 2022-05-24 DIAGNOSIS — Z950 Presence of cardiac pacemaker: Secondary | ICD-10-CM | POA: Diagnosis not present

## 2022-05-24 DIAGNOSIS — R9431 Abnormal electrocardiogram [ECG] [EKG]: Secondary | ICD-10-CM | POA: Diagnosis not present

## 2022-05-24 DIAGNOSIS — N1832 Chronic kidney disease, stage 3b: Secondary | ICD-10-CM | POA: Diagnosis not present

## 2022-05-24 DIAGNOSIS — Z0389 Encounter for observation for other suspected diseases and conditions ruled out: Secondary | ICD-10-CM | POA: Diagnosis not present

## 2022-05-24 DIAGNOSIS — Z7189 Other specified counseling: Secondary | ICD-10-CM | POA: Diagnosis not present

## 2022-05-24 DIAGNOSIS — Z743 Need for continuous supervision: Secondary | ICD-10-CM | POA: Diagnosis not present

## 2022-05-24 DIAGNOSIS — R413 Other amnesia: Secondary | ICD-10-CM | POA: Diagnosis not present

## 2022-05-24 LAB — COMPREHENSIVE METABOLIC PANEL
ALT: 21 U/L (ref 0–44)
AST: 39 U/L (ref 15–41)
Albumin: 3.7 g/dL (ref 3.5–5.0)
Alkaline Phosphatase: 45 U/L (ref 38–126)
Anion gap: 13 (ref 5–15)
BUN: 17 mg/dL (ref 8–23)
CO2: 24 mmol/L (ref 22–32)
Calcium: 10.2 mg/dL (ref 8.9–10.3)
Chloride: 101 mmol/L (ref 98–111)
Creatinine, Ser: 1.38 mg/dL — ABNORMAL HIGH (ref 0.44–1.00)
GFR, Estimated: 35 mL/min — ABNORMAL LOW (ref >60)
Glucose, Bld: 109 mg/dL — ABNORMAL HIGH (ref 70–99)
Potassium: 4 mmol/L (ref 3.5–5.1)
Sodium: 138 mmol/L (ref 135–145)
Total Bilirubin: 0.9 mg/dL (ref 0.3–1.2)
Total Protein: 6.9 g/dL (ref 6.5–8.1)

## 2022-05-24 LAB — CBC WITH DIFFERENTIAL/PLATELET
Abs Immature Granulocytes: 0.02 10*3/uL (ref 0.00–0.07)
Basophils Absolute: 0 10*3/uL (ref 0.0–0.1)
Basophils Relative: 1 %
Eosinophils Absolute: 0.1 10*3/uL (ref 0.0–0.5)
Eosinophils Relative: 2 %
HCT: 41.1 % (ref 36.0–46.0)
Hemoglobin: 13.2 g/dL (ref 12.0–15.0)
Immature Granulocytes: 0 %
Lymphocytes Relative: 22 %
Lymphs Abs: 1.4 10*3/uL (ref 0.7–4.0)
MCH: 32 pg (ref 26.0–34.0)
MCHC: 32.1 g/dL (ref 30.0–36.0)
MCV: 99.8 fL (ref 80.0–100.0)
Monocytes Absolute: 0.7 10*3/uL (ref 0.1–1.0)
Monocytes Relative: 11 %
Neutro Abs: 4.1 10*3/uL (ref 1.7–7.7)
Neutrophils Relative %: 64 %
Platelets: 165 10*3/uL (ref 150–400)
RBC: 4.12 MIL/uL (ref 3.87–5.11)
RDW: 14 % (ref 11.5–15.5)
WBC: 6.3 10*3/uL (ref 4.0–10.5)
nRBC: 0 % (ref 0.0–0.2)

## 2022-05-24 LAB — PROTIME-INR
INR: 1.1 (ref 0.8–1.2)
Prothrombin Time: 13.6 seconds (ref 11.4–15.2)

## 2022-05-24 LAB — TROPONIN I (HIGH SENSITIVITY): Troponin I (High Sensitivity): 129 ng/L (ref <18)

## 2022-05-24 LAB — LACTIC ACID, PLASMA: Lactic Acid, Venous: 1.4 mmol/L (ref 0.5–1.9)

## 2022-05-24 NOTE — ED Triage Notes (Signed)
Pt arrived from home via [facility] w c/o possible sepsis per EMS. Pt is poor historian. Per family pt is unable to ambulate and communicate as per baseline. Pt known to have recurrent UTIS's. Currently on cipro for prophylactic.

## 2022-05-24 NOTE — ED Provider Notes (Signed)
[facility] EMERGENCY DEPARTMENT Provider Note   CSN: [id number] Arrival date & time: 05/24/22  2003     History  Chief Complaint  Patient presents with   Altered Mental Status    Julie Bass is a 88 y.o. female.  Presented to the emergency room due to concern for altered mental status.  Family has noted that patient over the last few days has seemed to be getting more confused, lethargic, less interactive.  Having a harder time walking, now not able to ambulate.  Has history of UTIs previously.  Patient had complained of some back pain earlier.  Daughter reports that she gave patient some Septra that was a prescription given by prior doctor to use as needed.  Completed 3 days of this without improvement in symptoms.  No fevers or chills at home.  Patient is enrolled in palliative care but not formally on hospice care.  Daughter reports patient has durable DNR.  Confirms would not want aggressive care but okay with things like antibiotics and fluids.  HPI     Home Medications Prior to Admission medications   Medication Sig Start Date End Date Taking? Authorizing Provider  amLODipine (NORVASC) 2.5 MG tablet TAKE 1 TABLET (2.5 MG TOTAL) BY MOUTH EVERY EVENING. KEEP APPOINTMENT FOR FURTHER REFILLS. 05/12/22   Troy Sine, MD  cilostazol (PLETAL) 50 MG tablet TAKE 1 TABLET BY MOUTH TWICE A DAY 02/26/22   Croitoru, Mihai, MD  citalopram (CELEXA) 10 MG tablet TAKE 1 TABLET BY MOUTH EVERY DAY Patient not taking: Reported on 12/03/2021 09/17/21   Troy Sine, MD  citalopram (CELEXA) 20 MG tablet TAKE 1 TABLET BY MOUTH EVERY DAY 09/17/21   Denita Lung, MD  CRANBERRY PO Take 1 capsule by mouth daily.    [provider]  ezetimibe (ZETIA) 10 MG tablet TAKE 1 TABLET BY MOUTH EVERY DAY 09/17/21   Troy Sine, MD  feeding supplement, ENSURE COMPLETE, (ENSURE COMPLETE) LIQD Take 237 mLs by mouth 3 (three) times daily between meals. Patient taking  differently: Take 237 mLs by mouth See admin instructions. Once to twice daily 08/27/14   Barrett, Evelene Croon, PA-C  HYDROcodone-acetaminophen (NORCO/VICODIN) 5-325 MG tablet Take 1 tablet by mouth every 4 (four) hours as needed. Patient not taking: Reported on 12/03/2021 02/20/21   Malvin Johns, MD  lidocaine (LIDODERM) 5 % Place 1 patch onto the skin daily. Remove & Discard patch within 12 hours or as directed by MD Patient not taking: Reported on 12/03/2021 02/20/21   Malvin Johns, MD  LIVALO 2 MG TABS TAKE 1 TABLET BY MOUTH EVERY DAY 11/18/21   Troy Sine, MD  metoprolol succinate (TOPROL-XL) 100 MG 24 hr tablet TAKE 1 TABLET BY MOUTH EVERY EVENING. 05/12/22   Troy Sine, MD  mirtazapine (REMERON SOL-TAB) 15 MG disintegrating tablet TAKE 1 TABLET BY MOUTH EVERYDAY AT BEDTIME 01/28/22   Denita Lung, MD  omega-3 acid ethyl esters (LOVAZA) 1 g capsule TAKE 1 CAPSULE (1 G TOTAL) BY MOUTH EVERY EVENING. 11/18/21   Troy Sine, MD  pantoprazole (PROTONIX) 40 MG tablet TAKE 1 TABLET (40 MG TOTAL) BY MOUTH DAILY AS NEEDED (FOR ACID REFLUX). 05/12/22   Troy Sine, MD  sulfamethoxazole-trimethoprim (BACTRIM DS) 800-160 MG tablet Take 1 tablet by mouth 2 (two) times daily. Patient not taking: Reported on 05/03/2022 04/21/22   Croitoru, Mihai, MD  timolol (TIMOPTIC) 0.5 % ophthalmic solution Place 1 drop into both eyes 2 (two)  times daily.     [provider]      Allergies    Atorvastatin calcium, Crestor [rosuvastatin calcium], Lipitor [atorvastatin calcium], and Rosuvastatin calcium    Review of Systems   Review of Systems  Unable to perform ROS: Dementia    Physical Exam Updated Vital Signs BP (!) 150/70   Pulse 67   Temp 98 F (36.7 C) (Oral)   Resp 18   SpO2 96%  Physical Exam Vitals and nursing note reviewed.  Constitutional:      Appearance: She is well-developed.     Comments: Appears somewhat lethargic, confused  HENT:     Head: Normocephalic and atraumatic.   Eyes:     Conjunctiva/sclera: Conjunctivae normal.  Cardiovascular:     Rate and Rhythm: Normal rate and regular rhythm.     Heart sounds: No murmur heard. Pulmonary:     Effort: Pulmonary effort is normal. No respiratory distress.     Breath sounds: Normal breath sounds.  Abdominal:     Palpations: Abdomen is soft.     Tenderness: There is abdominal tenderness.     Comments: Generalized tenderness to palpation  Musculoskeletal:        General: No swelling.     Cervical back: Neck supple.  Skin:    General: Skin is warm and dry.     Capillary Refill: Capillary refill takes less than 2 seconds.  Neurological:     Comments: Somewhat lethargic but arouses to voice, follows commands, answers very basic questions, baseline dementia  Psychiatric:        Mood and Affect: Mood normal.     ED Results / Procedures / Treatments   Labs (all labs ordered are listed, but only abnormal results are displayed) Labs Reviewed  COMPREHENSIVE METABOLIC PANEL - Abnormal; Notable for the following components:      Result Value   Glucose, Bld 109 (*)    Creatinine, Ser 1.38 (*)    GFR, Estimated 35 (*)    All other components within normal limits  TROPONIN I (HIGH SENSITIVITY) - Abnormal; Notable for the following components:   Troponin I (High Sensitivity) 129 (*)    All other components within normal limits  CULTURE, BLOOD (ROUTINE X 2)  CULTURE, BLOOD (ROUTINE X 2)  URINE CULTURE  LACTIC ACID, PLASMA  CBC WITH DIFFERENTIAL/PLATELET  PROTIME-INR  LACTIC ACID, PLASMA  URINALYSIS, ROUTINE W REFLEX MICROSCOPIC  TROPONIN I (HIGH SENSITIVITY)    EKG EKG Interpretation  Date/Time:  Monday May 24 2022 21:51:13 EDT Ventricular Rate:  65 PR Interval:  220 QRS Duration: 84 QT Interval:  446 QTC Calculation: 464 R Axis:   -7 Text Interpretation: Sinus rhythm Prolonged PR interval LVH with secondary repolarization abnormality Confirmed by Madalyn Rob [phone]) on 05/24/2022 11:37:31  PM  Radiology CT ABDOMEN PELVIS WO CONTRAST  Result Date: 05/24/2022 CLINICAL DATA:  Abdominal pain EXAM: CT ABDOMEN AND PELVIS WITHOUT CONTRAST TECHNIQUE: Multidetector CT imaging of the abdomen and pelvis was performed following the standard protocol without IV contrast. RADIATION DOSE REDUCTION: This exam was performed according to the departmental dose-optimization program which includes automated exposure control, adjustment of the mA and/or kV according to patient size and/or use of iterative reconstruction technique. COMPARISON:  05/30/2020 FINDINGS: Lower chest: Dependent opacities in the lower lobes, likely dependent atelectasis. Hepatobiliary: No focal hepatic abnormality.  Prior cholecystectomy. Pancreas: No focal abnormality or ductal dilatation. Spleen: No focal abnormality.  Normal size. Adrenals/Urinary Tract: Left interpolar calyceal diverticulum again noted containing  layering stones, unchanged since prior study. Renal vascular calcifications in the renal hila bilaterally. No hydronephrosis. Adrenal glands and urinary bladder unremarkable. Stomach/Bowel: Extensive sigmoid diverticulosis. No active diverticulitis. Diffuse gaseous distention of bowel without evidence of obstruction. This may reflect mild ileus. Vascular/Lymphatic: Diffuse aortic and iliac calcifications. No evidence of aneurysm or adenopathy. Reproductive: Prior hysterectomy.  No adnexal masses. Other: No free fluid or free air. Musculoskeletal: No acute bony abnormality. 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 No ureteral stones or hydronephrosis. Dependent atelectasis in the lower lobes. Diffuse gaseous distention of bowel may reflect ileus. Aortoiliac atherosclerosis. Electronically Signed   By: Rolm Baptise M.D.   On: 05/24/2022 23:18   CT Head Wo Contrast  Result Date: 05/24/2022 CLINICAL DATA:  Memory loss Mental status change, unknown cause EXAM: CT HEAD WITHOUT CONTRAST TECHNIQUE:  Contiguous axial images were obtained from the base of the skull through the vertex without intravenous contrast. RADIATION DOSE REDUCTION: This exam was performed according to the departmental dose-optimization program which includes automated exposure control, adjustment of the mA and/or kV according to patient size and/or use of iterative reconstruction technique. COMPARISON:  03/23/2021 FINDINGS: Brain: There is atrophy and chronic small vessel disease changes. No acute intracranial abnormality. Specifically, no hemorrhage, hydrocephalus, mass lesion, acute infarction, or significant intracranial injury. Vascular: No hyperdense vessel or unexpected calcification. Extensive vertebral and carotid vascular calcifications. Skull: No acute calvarial abnormality. Sinuses/Orbits: No acute findings Other: None IMPRESSION: Atrophy, chronic microvascular disease. No acute intracranial abnormality. Electronically Signed   By: Rolm Baptise M.D.   On: 05/24/2022 23:12   DG Chest Port 1 View  Result Date: 05/24/2022 CLINICAL DATA:  Questionable sepsis - evaluate for abnormality EXAM: PORTABLE CHEST 1 VIEW COMPARISON:  05/29/2020 FINDINGS: Left pacer remains in place, unchanged. Heart and mediastinal contours are within normal limits. No focal opacities or effusions. No acute bony abnormality. Prior right shoulder replacement, unchanged. IMPRESSION: No active cardiopulmonary disease. Electronically Signed   By: Rolm Baptise M.D.   On: 05/24/2022 21:02    Procedures Procedures    Medications Ordered in ED Medications - No data to display  ED Course/ Medical Decision Making/ A&P                           Medical Decision Making Amount and/or Complexity of Data Reviewed Labs: ordered. Radiology: ordered. ECG/medicine tests: ordered.   88 year old presents to ER from home due to concern for increasing lethargy.  Has dementia, enrolled in palliative care but not hospice.  Daughter reports durable DNR, okay  with fluids, antibiotics but no aggressive interventions/surgeries.  Patient appears somewhat lethargic but is moving all 4 extremities.  Noted some tenderness to her abdomen on exam.  Broad work-up commenced.  Normal lactic acid, no leukocytosis, no fever which lower suspicion for sepsis.  Her troponin is somewhat elevated but no EKG changes.  I anticipate patient will need admission.  I have discussed the case with Dr. Laverta Baltimore who will follow-up on urinalysis, CT scan of head, abdomen pelvis.  I further anticipate patient would benefit from formal palliative care consultation while in hospital to continue goals of care conversation.          Final Clinical Impression(s) / ED Diagnoses Final diagnoses:  Altered mental status, unspecified altered mental status type  Elevated troponin    Rx / DC Orders ED Discharge Orders     None  Lucrezia Starch, MD 05/24/22 [phone]

## 2022-05-24 NOTE — ED Provider Notes (Signed)
Blood pressure (!) 150/70, pulse 67, temperature 98 F (36.7 C), temperature source Oral, resp. rate 18, SpO2 96 %.  Assuming care from Dr. Roslynn Amble.  In short, Julie Bass is a 88 y.o. female with a chief complaint of Altered Mental Status .  Refer to the original H&P for additional details.  The current plan of care is to follow up on CT imaging and UA.  CT imaging reassuring. Plan for admit.   Discussed patient's case with [facility] to request admission. Patient and family (if present) updated with plan. Care transferred to [facility] service.  I reviewed all nursing notes, vitals, pertinent old records, EKGs, labs, imaging (as available).     Margette Fast, MD 05/25/22 [phone]

## 2022-05-25 ENCOUNTER — Inpatient Hospital Stay (HOSPITAL_COMMUNITY): Payer: Medicare Other

## 2022-05-25 DIAGNOSIS — R627 Adult failure to thrive: Secondary | ICD-10-CM | POA: Diagnosis not present

## 2022-05-25 DIAGNOSIS — E876 Hypokalemia: Secondary | ICD-10-CM | POA: Diagnosis not present

## 2022-05-25 DIAGNOSIS — M810 Age-related osteoporosis without current pathological fracture: Secondary | ICD-10-CM | POA: Diagnosis present

## 2022-05-25 DIAGNOSIS — E785 Hyperlipidemia, unspecified: Secondary | ICD-10-CM | POA: Diagnosis present

## 2022-05-25 DIAGNOSIS — Z95828 Presence of other vascular implants and grafts: Secondary | ICD-10-CM | POA: Diagnosis not present

## 2022-05-25 DIAGNOSIS — I739 Peripheral vascular disease, unspecified: Secondary | ICD-10-CM | POA: Diagnosis present

## 2022-05-25 DIAGNOSIS — Z515 Encounter for palliative care: Secondary | ICD-10-CM | POA: Diagnosis not present

## 2022-05-25 DIAGNOSIS — G9340 Encephalopathy, unspecified: Secondary | ICD-10-CM | POA: Diagnosis present

## 2022-05-25 DIAGNOSIS — R7989 Other specified abnormal findings of blood chemistry: Secondary | ICD-10-CM | POA: Diagnosis not present

## 2022-05-25 DIAGNOSIS — Z8249 Family history of ischemic heart disease and other diseases of the circulatory system: Secondary | ICD-10-CM | POA: Diagnosis not present

## 2022-05-25 DIAGNOSIS — N1832 Chronic kidney disease, stage 3b: Secondary | ICD-10-CM | POA: Diagnosis present

## 2022-05-25 DIAGNOSIS — I160 Hypertensive urgency: Secondary | ICD-10-CM | POA: Diagnosis present

## 2022-05-25 DIAGNOSIS — F0390 Unspecified dementia without behavioral disturbance: Secondary | ICD-10-CM | POA: Diagnosis present

## 2022-05-25 DIAGNOSIS — R531 Weakness: Secondary | ICD-10-CM | POA: Diagnosis not present

## 2022-05-25 DIAGNOSIS — R008 Other abnormalities of heart beat: Secondary | ICD-10-CM | POA: Diagnosis not present

## 2022-05-25 DIAGNOSIS — R778 Other specified abnormalities of plasma proteins: Secondary | ICD-10-CM

## 2022-05-25 DIAGNOSIS — I340 Nonrheumatic mitral (valve) insufficiency: Secondary | ICD-10-CM | POA: Diagnosis present

## 2022-05-25 DIAGNOSIS — Z66 Do not resuscitate: Secondary | ICD-10-CM | POA: Diagnosis present

## 2022-05-25 DIAGNOSIS — Z888 Allergy status to other drugs, medicaments and biological substances status: Secondary | ICD-10-CM | POA: Diagnosis not present

## 2022-05-25 DIAGNOSIS — I472 Ventricular tachycardia, unspecified: Secondary | ICD-10-CM | POA: Diagnosis present

## 2022-05-25 DIAGNOSIS — R54 Age-related physical debility: Secondary | ICD-10-CM | POA: Diagnosis present

## 2022-05-25 DIAGNOSIS — N390 Urinary tract infection, site not specified: Secondary | ICD-10-CM | POA: Diagnosis present

## 2022-05-25 DIAGNOSIS — E7800 Pure hypercholesterolemia, unspecified: Secondary | ICD-10-CM | POA: Diagnosis not present

## 2022-05-25 DIAGNOSIS — I129 Hypertensive chronic kidney disease with stage 1 through stage 4 chronic kidney disease, or unspecified chronic kidney disease: Secondary | ICD-10-CM | POA: Diagnosis present

## 2022-05-25 DIAGNOSIS — H409 Unspecified glaucoma: Secondary | ICD-10-CM | POA: Diagnosis present

## 2022-05-25 DIAGNOSIS — Z7189 Other specified counseling: Secondary | ICD-10-CM | POA: Diagnosis not present

## 2022-05-25 DIAGNOSIS — Z87891 Personal history of nicotine dependence: Secondary | ICD-10-CM | POA: Diagnosis not present

## 2022-05-25 DIAGNOSIS — Z950 Presence of cardiac pacemaker: Secondary | ICD-10-CM | POA: Diagnosis not present

## 2022-05-25 DIAGNOSIS — Z96611 Presence of right artificial shoulder joint: Secondary | ICD-10-CM | POA: Diagnosis present

## 2022-05-25 DIAGNOSIS — F32A Depression, unspecified: Secondary | ICD-10-CM | POA: Diagnosis present

## 2022-05-25 DIAGNOSIS — K219 Gastro-esophageal reflux disease without esophagitis: Secondary | ICD-10-CM | POA: Diagnosis present

## 2022-05-25 DIAGNOSIS — G9341 Metabolic encephalopathy: Secondary | ICD-10-CM | POA: Diagnosis present

## 2022-05-25 LAB — COMPREHENSIVE METABOLIC PANEL
ALT: 24 U/L (ref 0–44)
AST: 36 U/L (ref 15–41)
Albumin: 3.5 g/dL (ref 3.5–5.0)
Alkaline Phosphatase: 46 U/L (ref 38–126)
Anion gap: 13 (ref 5–15)
BUN: 13 mg/dL (ref 8–23)
CO2: 23 mmol/L (ref 22–32)
Calcium: 9.7 mg/dL (ref 8.9–10.3)
Chloride: 104 mmol/L (ref 98–111)
Creatinine, Ser: 1.21 mg/dL — ABNORMAL HIGH (ref 0.44–1.00)
GFR, Estimated: 42 mL/min — ABNORMAL LOW (ref >60)
Glucose, Bld: 103 mg/dL — ABNORMAL HIGH (ref 70–99)
Potassium: 3.5 mmol/L (ref 3.5–5.1)
Sodium: 140 mmol/L (ref 135–145)
Total Bilirubin: 1.2 mg/dL (ref 0.3–1.2)
Total Protein: 7 g/dL (ref 6.5–8.1)

## 2022-05-25 LAB — ECHOCARDIOGRAM COMPLETE
Area-P 1/2: 2.85 cm2
MV VTI: 1.59 cm2
S' Lateral: 2 cm

## 2022-05-25 LAB — RESPIRATORY PANEL BY PCR

## 2022-05-25 LAB — URINALYSIS, ROUTINE W REFLEX MICROSCOPIC
Bilirubin Urine: NEGATIVE
Glucose, UA: NEGATIVE mg/dL
Hgb urine dipstick: NEGATIVE
Ketones, ur: NEGATIVE mg/dL
Leukocytes,Ua: NEGATIVE
Nitrite: NEGATIVE
Protein, ur: NEGATIVE mg/dL
Specific Gravity, Urine: 1.012 (ref 1.005–1.030)
pH: 7 (ref 5.0–8.0)

## 2022-05-25 LAB — I-STAT VENOUS BLOOD GAS, ED
Acid-Base Excess: 1 mmol/L (ref 0.0–2.0)
Bicarbonate: 25 mmol/L (ref 20.0–28.0)
Calcium, Ion: 1.24 mmol/L (ref 1.15–1.40)
HCT: 40 % (ref 36.0–46.0)
Hemoglobin: 13.6 g/dL (ref 12.0–15.0)
O2 Saturation: 81 %
Potassium: 3.5 mmol/L (ref 3.5–5.1)
Sodium: 139 mmol/L (ref 135–145)
TCO2: 26 mmol/L (ref 22–32)
pCO2, Ven: 36.5 mmHg — ABNORMAL LOW (ref 44–60)
pH, Ven: 7.444 — ABNORMAL HIGH (ref 7.25–7.43)
pO2, Ven: 43 mmHg (ref 32–45)

## 2022-05-25 LAB — HEMOGLOBIN A1C
Hgb A1c MFr Bld: 4.7 % — ABNORMAL LOW (ref 4.8–5.6)
Mean Plasma Glucose: 88.19 mg/dL

## 2022-05-25 LAB — BRAIN NATRIURETIC PEPTIDE: B Natriuretic Peptide: 168.1 pg/mL — ABNORMAL HIGH (ref 0.0–100.0)

## 2022-05-25 LAB — LACTIC ACID, PLASMA: Lactic Acid, Venous: 1.3 mmol/L (ref 0.5–1.9)

## 2022-05-25 LAB — AMMONIA: Ammonia: 15 umol/L (ref 9–35)

## 2022-05-25 LAB — CBC WITH DIFFERENTIAL/PLATELET
Abs Immature Granulocytes: 0.01 10*3/uL (ref 0.00–0.07)
Basophils Absolute: 0 10*3/uL (ref 0.0–0.1)
Basophils Relative: 1 %
Eosinophils Absolute: 0.2 10*3/uL (ref 0.0–0.5)
Eosinophils Relative: 2 %
HCT: 40.4 % (ref 36.0–46.0)
Hemoglobin: 13.5 g/dL (ref 12.0–15.0)
Immature Granulocytes: 0 %
Lymphocytes Relative: 25 %
Lymphs Abs: 1.6 10*3/uL (ref 0.7–4.0)
MCH: 32.1 pg (ref 26.0–34.0)
MCHC: 33.4 g/dL (ref 30.0–36.0)
MCV: 96.2 fL (ref 80.0–100.0)
Monocytes Absolute: 0.7 10*3/uL (ref 0.1–1.0)
Monocytes Relative: 11 %
Neutro Abs: 4 10*3/uL (ref 1.7–7.7)
Neutrophils Relative %: 61 %
Platelets: 168 10*3/uL (ref 150–400)
RBC: 4.2 MIL/uL (ref 3.87–5.11)
RDW: 13.7 % (ref 11.5–15.5)
WBC: 6.5 10*3/uL (ref 4.0–10.5)
nRBC: 0 % (ref 0.0–0.2)

## 2022-05-25 LAB — TROPONIN I (HIGH SENSITIVITY)
Troponin I (High Sensitivity): 133 ng/L (ref <18)
Troponin I (High Sensitivity): 134 ng/L (ref <18)
Troponin I (High Sensitivity): 148 ng/L (ref <18)

## 2022-05-25 LAB — TSH: TSH: 2.388 u[IU]/mL (ref 0.350–4.500)

## 2022-05-25 MED ORDER — ONDANSETRON HCL 4 MG PO TABS: 4.0000 mg | ORAL_TABLET | Freq: Four times a day (QID) | ORAL | Status: DC | PRN

## 2022-05-25 MED ORDER — OMEGA-3-ACID ETHYL ESTERS 1 G PO CAPS
1.0000 g | ORAL_CAPSULE | Freq: Every evening | ORAL | Status: DC
  Administered 2022-05-25 – 2022-05-26 (×2): 1 g via ORAL
  Filled 2022-05-25 (×5): qty 1

## 2022-05-25 MED ORDER — METOPROLOL SUCCINATE ER 100 MG PO TB24
100.0000 mg | ORAL_TABLET | Freq: Every day | ORAL | Status: DC
  Administered 2022-05-25: 100 mg via ORAL
  Filled 2022-05-25: qty 4
  Filled 2022-05-25: qty 1

## 2022-05-25 MED ORDER — TIMOLOL MALEATE 0.5 % OP SOLN
1.0000 [drp] | Freq: Two times a day (BID) | OPHTHALMIC | Status: DC
  Administered 2022-05-25: 1 [drp] via OPHTHALMIC
  Filled 2022-05-25: qty 5

## 2022-05-25 MED ORDER — ACETAMINOPHEN 650 MG RE SUPP: 650.0000 mg | Freq: Four times a day (QID) | RECTAL | Status: DC | PRN

## 2022-05-25 MED ORDER — SODIUM CHLORIDE 0.9 % IV SOLN: INTRAVENOUS | Status: AC

## 2022-05-25 MED ORDER — ONDANSETRON HCL 4 MG/2ML IJ SOLN: 4.0000 mg | Freq: Four times a day (QID) | INTRAMUSCULAR | Status: DC | PRN

## 2022-05-25 MED ORDER — METOPROLOL SUCCINATE ER 25 MG PO TB24
100.0000 mg | ORAL_TABLET | Freq: Every evening | ORAL | Status: DC
Start: 2022-05-25 — End: 2022-05-25

## 2022-05-25 MED ORDER — PANTOPRAZOLE SODIUM 40 MG PO TBEC: 40.0000 mg | DELAYED_RELEASE_TABLET | Freq: Every day | ORAL | Status: DC | PRN

## 2022-05-25 MED ORDER — ACETAMINOPHEN 325 MG PO TABS
650.0000 mg | ORAL_TABLET | Freq: Four times a day (QID) | ORAL | Status: DC | PRN
  Administered 2022-05-25 – 2022-05-27 (×2): 650 mg via ORAL
  Filled 2022-05-25 (×3): qty 2

## 2022-05-25 MED ORDER — CILOSTAZOL 50 MG PO TABS
50.0000 mg | ORAL_TABLET | Freq: Two times a day (BID) | ORAL | Status: DC
  Administered 2022-05-25 – 2022-05-28 (×7): 50 mg via ORAL
  Filled 2022-05-25 (×9): qty 1

## 2022-05-25 MED ORDER — CITALOPRAM HYDROBROMIDE 10 MG PO TABS
20.0000 mg | ORAL_TABLET | Freq: Every day | ORAL | Status: DC
  Administered 2022-05-25 – 2022-05-28 (×4): 20 mg via ORAL
  Filled 2022-05-25 (×4): qty 2

## 2022-05-25 MED ORDER — SODIUM CHLORIDE 0.9 % IV SOLN
1.0000 g | INTRAVENOUS | Status: DC
  Administered 2022-05-25 – 2022-05-28 (×4): 1 g via INTRAVENOUS
  Filled 2022-05-25 (×4): qty 10

## 2022-05-25 MED ORDER — HEPARIN SODIUM (PORCINE) 5000 UNIT/ML IJ SOLN
5000.0000 [IU] | Freq: Three times a day (TID) | INTRAMUSCULAR | Status: DC
  Administered 2022-05-25 – 2022-05-28 (×10): 5000 [IU] via SUBCUTANEOUS
  Filled 2022-05-25 (×9): qty 1

## 2022-05-25 MED ORDER — AMLODIPINE BESYLATE 5 MG PO TABS: 2.5000 mg | ORAL_TABLET | Freq: Every evening | ORAL | Status: DC

## 2022-05-25 MED ORDER — EZETIMIBE 10 MG PO TABS
10.0000 mg | ORAL_TABLET | Freq: Every day | ORAL | Status: DC
  Administered 2022-05-25 – 2022-05-28 (×4): 10 mg via ORAL
  Filled 2022-05-25 (×4): qty 1

## 2022-05-25 MED ORDER — HYDRALAZINE HCL 25 MG PO TABS
25.0000 mg | ORAL_TABLET | Freq: Four times a day (QID) | ORAL | Status: DC | PRN
  Administered 2022-05-25 – 2022-05-27 (×3): 25 mg via ORAL
  Filled 2022-05-25 (×3): qty 1

## 2022-05-25 MED ORDER — ALBUTEROL SULFATE (2.5 MG/3ML) 0.083% IN NEBU: 2.5000 mg | INHALATION_SOLUTION | RESPIRATORY_TRACT | Status: DC | PRN

## 2022-05-25 MED ORDER — SODIUM CHLORIDE 0.9% FLUSH
3.0000 mL | Freq: Two times a day (BID) | INTRAVENOUS | Status: DC
  Administered 2022-05-25 – 2022-05-28 (×6): 3 mL via INTRAVENOUS

## 2022-05-25 MED ORDER — AMLODIPINE BESYLATE 5 MG PO TABS
5.0000 mg | ORAL_TABLET | Freq: Every day | ORAL | Status: DC
  Administered 2022-05-25 – 2022-05-28 (×4): 5 mg via ORAL
  Filled 2022-05-25 (×4): qty 1

## 2022-05-25 MED ORDER — SODIUM CHLORIDE 0.9 % IV BOLUS
500.0000 mL | Freq: Once | INTRAVENOUS | Status: AC
  Administered 2022-05-25: 500 mL via INTRAVENOUS

## 2022-05-25 MED ORDER — ENSURE ENLIVE PO LIQD
237.0000 mL | Freq: Three times a day (TID) | ORAL | Status: DC
  Administered 2022-05-25 – 2022-05-27 (×5): 237 mL via ORAL
  Filled 2022-05-25 (×2): qty 237

## 2022-05-25 NOTE — H&P (Signed)
History and Physical    Julie Bass RWE:[id number] DOB: 1926/11/11 DOA: 05/24/2022  PCP: Denita Lung, MD  Patient coming from: home I have personally briefly reviewed patient's old medical records in [place]  Chief Complaint: change in ms weakness  HPI: Julie Bass is a 88 y.o. female with medical history significant of  CKDIII,Dememtia, GERD, HTN , NSVT,PAD , Glaucoma who presents to ED brought in by EMS due to change in ms and weakness from baseline , family with concern for uti. Per family patient over the last 4 days has not been her self. She had become more lethargic and  weak. The notes patient is usually able to help with her  ADLS but has not been abel to do this due to being weak.  Unable to get any further history due to patient dementia and current lethargic state.  Family  noted that she has complaints of increase urinary frequency and at at that time was given septra.   ED Course:  Afeb, bp 176/70, hr 69, rr 16, sat 97%  Cxr :NAD Wbc: 6.3, plt 165  hgb 13.2 Lactic 1.4 Na 138, k 4, cr 1.38 close to baseline Ce: 129,133 UA: neg Ekg sinus rhythm LVH CTAB IMPRESSION: Stable left renal calyceal diverticulum with layering stones. No ureteral stones or hydronephrosis.   Dependent atelectasis in the lower lobes.   Diffuse gaseous distention of bowel may reflect ileus.   Aortoiliac atherosclerosis. Review of Systems: As per HPI otherwise 10 point review of systems negative.   Past Medical History:  Diagnosis Date   Arthritis    Chronic kidney disease    KIDNEY STONES   Dementia ([place])    BEGINNING STAGES    Depression    GERD (gastroesophageal reflux disease)    Glaucoma    Hiatal hernia    HTN (hypertension) 10/18/2011   Hyperlipemia 10/18/2011   NSVT (nonsustained ventricular tachycardia) ([facility]) 10/18/2011   Osteoporosis    PAD (peripheral artery disease) ([place]) 10/18/2011   h/o left SFA stent    Past Surgical History:  Procedure Laterality  Date   ABDOMINAL HYSTERECTOMY  1973   CARDIAC CATHETERIZATION     2012   CHOLECYSTECTOMY     LOOP RECORDER IMPLANT N/A 08/27/2014   Procedure: LOOP RECORDER IMPLANT;  Surgeon: Coralyn Mark, MD;  Location: [facility];  Service: Cardiovascular;  Laterality: N/A;   LOOP RECORDER REMOVAL N/A 01/09/2018   Procedure: LOOP RECORDER REMOVAL;  Surgeon: Evans Lance, MD;  Location: [facility];  Service: Cardiovascular;  Laterality: N/A;   LOWER EXTREMITY ANGIOGRAM  11/02/2007   stent of mid left SFA with 6x172m EV3 self-expanding stent and 6x3 in prox region (Dr. JAdora Fridge   [place] DOPPLER  2013   right SFA with 50-69% diameter reduction, right PTA/peroneal occluded, L SFA prox to stent has narrowing with increased velocities >60% diameter reduction, L SFA stent patent, L ATA w/occlusive disease, bilat ABIs show mild arterial insuffiency at rest   NM MYOCAR PERF WALL MOTION  10/2011   non-gated - normal stuy, EF 82%, normal LV wall motion   PACEMAKER IMPLANT N/A 01/09/2018   Procedure: PACEMAKER IMPLANT;  Surgeon: TEvans Lance MD;  [facility];  Service: Cardiovascular;  Laterality: N/A;   REVERSE SHOULDER ARTHROPLASTY Right 01/26/2013   Procedure: RIGHT REVERSE TOTAL SHOULDER ARTHROPLASTY;  Surgeon: SAugustin Schooling MD;  [facility]  Service: Orthopedics;  Laterality: Right;   TRANSTHORACIC ECHOCARDIOGRAM  10/2012   EF 75-70%, mod conc hypertrophy, severely calcified MV annulus, LA mildly dailted, PA peak pressure 50mHg     reports that she quit smoking about 38 years ago. Her smoking use included cigarettes. She has never used smokeless tobacco. She reports that she does not drink alcohol and does not use drugs.  Allergies  Allergen Reactions   Atorvastatin Calcium     Other reaction(s): Myalgias (intolerance) Muscle pain   Crestor [Rosuvastatin Calcium]     Muscle pain   Lipitor [Atorvastatin Calcium]      Muscle pain   Rosuvastatin Calcium     Other reaction(s): Myalgias (intolerance) Muscle pain    Family History  Problem Relation Age of Onset   Throat cancer Father    Cancer Mother    CAD Son    Breast cancer Sister        cervical cancer   Colon cancer Brother        throat cancer   Pulmonary embolism Daughter    Hypertension Daughter     Prior to Admission medications   Medication Sig Start Date End Date Taking? Authorizing Provider  amLODipine (NORVASC) 2.5 MG tablet TAKE 1 TABLET (2.5 MG TOTAL) BY MOUTH EVERY EVENING. KEEP APPOINTMENT FOR FURTHER REFILLS. 05/12/22   KTroy Sine MD  cilostazol (PLETAL) 50 MG tablet TAKE 1 TABLET BY MOUTH TWICE A DAY 02/26/22   Croitoru, Mihai, MD  citalopram (CELEXA) 10 MG tablet TAKE 1 TABLET BY MOUTH EVERY DAY Patient not taking: Reported on 12/03/2021 09/17/21   KTroy Sine MD  citalopram (CELEXA) 20 MG tablet TAKE 1 TABLET BY MOUTH EVERY DAY 09/17/21   LDenita Lung MD  CRANBERRY PO Take 1 capsule by mouth daily.    [provider]  ezetimibe (ZETIA) 10 MG tablet TAKE 1 TABLET BY MOUTH EVERY DAY 09/17/21   KTroy Sine MD  feeding supplement, ENSURE COMPLETE, (ENSURE COMPLETE) LIQD Take 237 mLs by mouth 3 (three) times daily between meals. Patient taking differently: Take 237 mLs by mouth See admin instructions. Once to twice daily 08/27/14   Barrett, REvelene Croon PA-C  HYDROcodone-acetaminophen (NORCO/VICODIN) 5-325 MG tablet Take 1 tablet by mouth every 4 (four) hours as needed. Patient not taking: Reported on 12/03/2021 02/20/21   BMalvin Johns MD  lidocaine (LIDODERM) 5 % Place 1 patch onto the skin daily. Remove & Discard patch within 12 hours or as directed by MD Patient not taking: Reported on 12/03/2021 02/20/21   BMalvin Johns MD  LIVALO 2 MG TABS TAKE 1 TABLET BY MOUTH EVERY DAY 11/18/21   KTroy Sine MD  metoprolol succinate (TOPROL-XL) 100 MG 24 hr tablet TAKE 1 TABLET BY MOUTH EVERY EVENING. 05/12/22   KTroy Sine MD  mirtazapine (REMERON SOL-TAB) 15 MG disintegrating tablet TAKE 1 TABLET BY MOUTH EVERYDAY AT BEDTIME 01/28/22   LDenita Lung MD  omega-3 acid ethyl esters (LOVAZA) 1 g capsule TAKE 1 CAPSULE (1 G TOTAL) BY MOUTH EVERY EVENING. 11/18/21   KTroy Sine MD  pantoprazole (PROTONIX) 40 MG tablet TAKE 1 TABLET (40 MG TOTAL) BY MOUTH DAILY AS NEEDED (FOR ACID REFLUX). 05/12/22   KTroy Sine MD  sulfamethoxazole-trimethoprim (BACTRIM DS) 800-160 MG tablet Take 1 tablet by mouth 2 (two) times daily. Patient not taking: Reported on 05/03/2022 04/21/22   Croitoru, Mihai, MD  timolol (TIMOPTIC) 0.5 % ophthalmic solution Place 1 drop into both  eyes 2 (two) times daily.     [provider]    Physical Exam: Vitals:   05/25/22 0330 05/25/22 0400 05/25/22 0430 05/25/22 0500  BP: (!) 199/78 (!) 167/66 (!) 168/72 (!) 181/156  Pulse: 76 66 66 68  Resp: '12 16 17 19  '$ Temp:      TempSrc:      SpO2: 96% 95% 92% 92%    Constitutional: NAD, calm, comfortable Vitals:   05/25/22 0330 05/25/22 0400 05/25/22 0430 05/25/22 0500  BP: (!) 199/78 (!) 167/66 (!) 168/72 (!) 181/156  Pulse: 76 66 66 68  Resp: '12 16 17 19  '$ Temp:      TempSrc:      SpO2: 96% 95% 92% 92%   Eyes: PERRL opaque cornea/lenz  unable to  assess, lids and conjunctivae normal ENMT: Mucous membranes are dry Posterior pharynx clear of any exudate or lesions.Normal dentition.  Neck: normal, supple, no masses, no thyromegaly Respiratory: clear to auscultation bilaterally, no wheezing, no crackles. Normal respiratory effort. No accessory muscle use.  Cardiovascular: Regular rate and rhythm, no murmurs / rubs / gallops. No extremity edema. 2+ pedal pulses. Abdomen: +suprapubic  tenderness, no masses palpated. No hepatosplenomegaly. Bowel sounds positive.  Musculoskeletal: no clubbing / cyanosis. No joint deformity upper and lower extremities. Good ROM, no contractures. Normal muscle tone.  Skin: no rashes, lesions,  ulcers. No induration Neurologic: CN 2-12 grossly intact. Sensation intact,  MAE  4+/5 strength  Psychiatric: unable to assess   Labs on Admission: I have personally reviewed following labs and imaging studies  CBC: Recent Labs  Lab 05/24/22 2108  WBC 6.3  NEUTROABS 4.1  HGB 13.2  HCT 41.1  MCV 99.8  PLT 147   Basic Metabolic Panel: Recent Labs  Lab 05/24/22 2108  NA 138  K 4.0  CL 101  CO2 24  GLUCOSE 109*  BUN 17  CREATININE 1.38*  CALCIUM 10.2   GFR: CrCl cannot be calculated (Unknown ideal weight.). Liver Function Tests: Recent Labs  Lab 05/24/22 2108  AST 39  ALT 21  ALKPHOS 45  BILITOT 0.9  PROT 6.9  ALBUMIN 3.7   No results for input(s): "LIPASE", "AMYLASE" in the last 168 hours. No results for input(s): "AMMONIA" in the last 168 hours. Coagulation Profile: Recent Labs  Lab 05/24/22 2108  INR 1.1   Cardiac Enzymes: No results for input(s): "CKTOTAL", "CKMB", "CKMBINDEX", "TROPONINI" in the last 168 hours. BNP (last 3 results) No results for input(s): "PROBNP" in the last 8760 hours. HbA1C: No results for input(s): "HGBA1C" in the last 72 hours. CBG: No results for input(s): "GLUCAP" in the last 168 hours. Lipid Profile: No results for input(s): "CHOL", "HDL", "LDLCALC", "TRIG", "CHOLHDL", "LDLDIRECT" in the last 72 hours. Thyroid Function Tests: No results for input(s): "TSH", "T4TOTAL", "FREET4", "T3FREE", "THYROIDAB" in the last 72 hours. Anemia Panel: No results for input(s): "VITAMINB12", "FOLATE", "FERRITIN", "TIBC", "IRON", "RETICCTPCT" in the last 72 hours. Urine analysis:    Component Value Date/Time   COLORURINE YELLOW 05/25/2022 0354   APPEARANCEUR CLOUDY (A) 05/25/2022 0354   LABSPEC 1.012 05/25/2022 0354   LABSPEC 1.015 06/12/2020 1456   PHURINE 7.0 05/25/2022 0354   GLUCOSEU NEGATIVE 05/25/2022 0354   HGBUR NEGATIVE 05/25/2022 0354   BILIRUBINUR NEGATIVE 05/25/2022 0354   BILIRUBINUR negative 06/12/2020 1456    BILIRUBINUR n 11/25/2016 1409   KETONESUR NEGATIVE 05/25/2022 0354   PROTEINUR NEGATIVE 05/25/2022 0354   UROBILINOGEN negative 11/25/2016 1409   UROBILINOGEN 1 10/08/2014 1529  NITRITE NEGATIVE 05/25/2022 0354   LEUKOCYTESUR NEGATIVE 05/25/2022 0354    Radiological Exams on Admission: CT ABDOMEN PELVIS WO CONTRAST  Result Date: 05/24/2022 CLINICAL DATA:  Abdominal pain EXAM: CT ABDOMEN AND PELVIS WITHOUT CONTRAST TECHNIQUE: Multidetector CT imaging of the abdomen and pelvis was performed following the standard protocol without IV contrast. RADIATION DOSE REDUCTION: This exam was performed according to the departmental dose-optimization program which includes automated exposure control, adjustment of the mA and/or kV according to patient size and/or use of iterative reconstruction technique. COMPARISON:  05/30/2020 FINDINGS: Lower chest: Dependent opacities in the lower lobes, likely dependent atelectasis. Hepatobiliary: No focal hepatic abnormality.  Prior cholecystectomy. Pancreas: No focal abnormality or ductal dilatation. Spleen: No focal abnormality.  Normal size. Adrenals/Urinary Tract: Left interpolar calyceal diverticulum again noted containing layering stones, unchanged since prior study. Renal vascular calcifications in the renal hila bilaterally. No hydronephrosis. Adrenal glands and urinary bladder unremarkable. Stomach/Bowel: Extensive sigmoid diverticulosis. No active diverticulitis. Diffuse gaseous distention of bowel without evidence of obstruction. This may reflect mild ileus. Vascular/Lymphatic: Diffuse aortic and iliac calcifications. No evidence of aneurysm or adenopathy. Reproductive: Prior hysterectomy.  No adnexal masses. Other: No free fluid or free air. Musculoskeletal: No acute bony abnormality. 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 No ureteral stones or hydronephrosis. Dependent atelectasis in the lower lobes. Diffuse gaseous distention of  bowel may reflect ileus. Aortoiliac atherosclerosis. Electronically Signed   By: Rolm Baptise M.D.   On: 05/24/2022 23:18   CT Head Wo Contrast  Result Date: 05/24/2022 CLINICAL DATA:  Memory loss Mental status change, unknown cause EXAM: CT HEAD WITHOUT CONTRAST TECHNIQUE: Contiguous axial images were obtained from the base of the skull through the vertex without intravenous contrast. RADIATION DOSE REDUCTION: This exam was performed according to the departmental dose-optimization program which includes automated exposure control, adjustment of the mA and/or kV according to patient size and/or use of iterative reconstruction technique. COMPARISON:  03/23/2021 FINDINGS: Brain: There is atrophy and chronic small vessel disease changes. No acute intracranial abnormality. Specifically, no hemorrhage, hydrocephalus, mass lesion, acute infarction, or significant intracranial injury. Vascular: No hyperdense vessel or unexpected calcification. Extensive vertebral and carotid vascular calcifications. Skull: No acute calvarial abnormality. Sinuses/Orbits: No acute findings Other: None IMPRESSION: Atrophy, chronic microvascular disease. No acute intracranial abnormality. Electronically Signed   By: Rolm Baptise M.D.   On: 05/24/2022 23:12   DG Chest Port 1 View  Result Date: 05/24/2022 CLINICAL DATA:  Questionable sepsis - evaluate for abnormality EXAM: PORTABLE CHEST 1 VIEW COMPARISON:  05/29/2020 FINDINGS: Left pacer remains in place, unchanged. Heart and mediastinal contours are within normal limits. No focal opacities or effusions. No acute bony abnormality. Prior right shoulder replacement, unchanged. IMPRESSION: No active cardiopulmonary disease. Electronically Signed   By: Rolm Baptise M.D.   On: 05/24/2022 21:02    EKG: Independently reviewed. See above   Assessment/Plan  Encephalopathy nos - head CT negative  -neuro exam  no focal weakness  -check respiratory panel  -UA negative , per family was  treated recently for UTI  -will seen urine culture to be complete  -check vbg, ammonia     HTN -elevated in ED -resume home regimen  -resume home regimen  -prn ant -htn medications  Abn CE  --no cardiac symptoms no ekg changes , no significant delta -continue to cycle ,echo in am to be complete     CKDIII -at baseline   Dementia  -at baseline able to help with adls  -  but per family has note steady progressive decline    GERD -ppi  PAD  -continue home meds   Glaucoma  -continue home meds  Social : Family requesting palliative care   DVT prophylaxis: heparin Code Status: DNR/DNI Family Communication:Waddell,[name] M (Daughter)  [phone] (Mobile) Disposition Plan: patient  expected to be admitted greater than 2 midnights  Consults called: n/a Admission status: med tele   Clance Boll MD [facility]   If 7PM-7AM, please contact night-coverage [email or web address] Password [facility]  05/25/2022, 5:59 AM

## 2022-05-25 NOTE — ED Notes (Signed)
Pt in room with son-in-law fidgeting around in the bed. Pt altered. Talking about car maintenance and driving. Per visitor pt hasn't driven in over [place]. Pt cooperative with staff at this time.

## 2022-05-25 NOTE — Progress Notes (Signed)
Subjective: Patient admitted this morning, see detailed H&P by Dr Marcello Moores 88 year old female with medical history of CKD stage III, dementia, GERD, hypertension, NSVT, PAD normal, came to ED due to change in mental status and weakness from baseline.  Patient family was concern for possible UTI amlodipine empirically given her Bactrim.  In the ED patient is confused.  Has mild elevation of troponin, blood pressure is elevated.  UA is clear.  Vitals:   05/25/22 0600 05/25/22 0630  BP: (!) 188/82 (!) 166/105  Pulse: 80 82  Resp: 18 16  Temp:    SpO2: 93% 53%      A/P Metabolic encephalopathy -Unclear etiology, patient has underlying dementia -Blood pressure is elevated so could be hypertensive encephalopathy -We will treat hypertension, increase dose of amlodipine to 5 mg daily, continue metoprolol 100 mg daily, hydralazine 25 mg p.o. every 6 hours as needed for BP more than 160/100.  Questionable UTI -UA is clear in the ED but family has been giving her Bactrim for past 3 days -Family requested to treat empirically for UTI -Urine culture has been obtained; follow urine culture results -We will start Rocephin 1 g IV daily  Abnormal troponin -Troponin mildly elevated; likely in setting of hypertensive urgency -We will treat blood pressure and follow serial troponin -Patient not candidate for aggressive intervention -Palliative care has been consulted for goals of care discussion  Dementia -Progressive decline noted by family      Oswald Hillock [facility]

## 2022-05-25 NOTE — Progress Notes (Signed)
Echocardiogram 2D Echocardiogram has been performed.  Oneal Deputy Osmel Dykstra RDCS 05/25/2022, 3:21 PM

## 2022-05-26 DIAGNOSIS — E7800 Pure hypercholesterolemia, unspecified: Secondary | ICD-10-CM | POA: Diagnosis not present

## 2022-05-26 DIAGNOSIS — I160 Hypertensive urgency: Secondary | ICD-10-CM | POA: Diagnosis not present

## 2022-05-26 DIAGNOSIS — N390 Urinary tract infection, site not specified: Secondary | ICD-10-CM

## 2022-05-26 DIAGNOSIS — E876 Hypokalemia: Secondary | ICD-10-CM

## 2022-05-26 DIAGNOSIS — F0390 Unspecified dementia without behavioral disturbance: Secondary | ICD-10-CM

## 2022-05-26 DIAGNOSIS — G9340 Encephalopathy, unspecified: Secondary | ICD-10-CM | POA: Diagnosis not present

## 2022-05-26 LAB — COMPREHENSIVE METABOLIC PANEL
ALT: 27 U/L (ref 0–44)
AST: 41 U/L (ref 15–41)
Albumin: 3.4 g/dL — ABNORMAL LOW (ref 3.5–5.0)
Alkaline Phosphatase: 46 U/L (ref 38–126)
Anion gap: 9 (ref 5–15)
BUN: 12 mg/dL (ref 8–23)
CO2: 24 mmol/L (ref 22–32)
Calcium: 9.7 mg/dL (ref 8.9–10.3)
Chloride: 107 mmol/L (ref 98–111)
Creatinine, Ser: 1.15 mg/dL — ABNORMAL HIGH (ref 0.44–1.00)
GFR, Estimated: 44 mL/min — ABNORMAL LOW (ref >60)
Glucose, Bld: 107 mg/dL — ABNORMAL HIGH (ref 70–99)
Potassium: 3.3 mmol/L — ABNORMAL LOW (ref 3.5–5.1)
Sodium: 140 mmol/L (ref 135–145)
Total Bilirubin: 1.3 mg/dL — ABNORMAL HIGH (ref 0.3–1.2)
Total Protein: 6.9 g/dL (ref 6.5–8.1)

## 2022-05-26 LAB — URINE CULTURE: Culture: NO GROWTH

## 2022-05-26 LAB — CBC
HCT: 41.9 % (ref 36.0–46.0)
Hemoglobin: 13.9 g/dL (ref 12.0–15.0)
MCH: 32.1 pg (ref 26.0–34.0)
MCHC: 33.2 g/dL (ref 30.0–36.0)
MCV: 96.8 fL (ref 80.0–100.0)
Platelets: 196 10*3/uL (ref 150–400)
RBC: 4.33 MIL/uL (ref 3.87–5.11)
RDW: 13.5 % (ref 11.5–15.5)
WBC: 6.7 10*3/uL (ref 4.0–10.5)
nRBC: 0 % (ref 0.0–0.2)

## 2022-05-26 MED ORDER — POTASSIUM CHLORIDE 10 MEQ/100ML IV SOLN: 10.0000 meq | INTRAVENOUS | Status: DC

## 2022-05-26 MED ORDER — LATANOPROST 0.005 % OP SOLN
1.0000 [drp] | Freq: Two times a day (BID) | OPHTHALMIC | Status: DC
Start: 2022-05-26 — End: 2022-05-28
  Administered 2022-05-26 – 2022-05-28 (×6): 1 [drp] via OPHTHALMIC
  Filled 2022-05-26: qty 2.5

## 2022-05-26 MED ORDER — POTASSIUM CHLORIDE 20 MEQ PO PACK
40.0000 meq | PACK | Freq: Two times a day (BID) | ORAL | Status: AC
  Administered 2022-05-26 (×2): 40 meq via ORAL
  Filled 2022-05-26 (×2): qty 2

## 2022-05-26 MED ORDER — METOPROLOL TARTRATE 25 MG PO TABS
25.0000 mg | ORAL_TABLET | Freq: Two times a day (BID) | ORAL | Status: DC
  Administered 2022-05-26 – 2022-05-28 (×5): 25 mg via ORAL
  Filled 2022-05-26 (×5): qty 1

## 2022-05-26 NOTE — TOC Initial Note (Signed)
Transition of Care [facility]) - Initial/Assessment Note    Patient Details  Name: Julie Bass MRN: [id number] Date of Birth: [birth year]  [facility] CM/SW Contact:    Pollie Friar, RN Phone Number: 05/26/2022, 2:19 PM  Clinical Narrative:                 CM met with the patient, her daughter and SIL. Patient slept through the visit. Daughter states she lives with a cousin. She has 24/7 supervision.  Daughter over sees her medications and SIL provides needed transportation.  Per family she is pretty independent at home. She does not cook but does dress self and minimal assist with bathing.  CM has asked for PT/OT evals.   Expected Discharge Plan: Home/Self Care Barriers to Discharge: Continued Medical Work up   Patient Goals and CMS Choice        Expected Discharge Plan and Services Expected Discharge Plan: Home/Self Care   Discharge Planning Services: CM Consult   Living arrangements for the past 2 months: Single Family Home                                      Prior Living Arrangements/Services Living arrangements for the past 2 months: Single Family Home Lives with:: Relatives Patient language and need for interpreter reviewed:: Yes Do you feel safe going back to the place where you live?: Yes        Care giver support system in place?: Yes (comment) Current home services: DME (rollator/ shower seat/ hand rails on toilet) Criminal Activity/Legal Involvement Pertinent to Current Situation/Hospitalization: No - Comment as needed  Activities of Daily Living      Permission Sought/Granted                  Emotional Assessment Appearance:: Appears stated age         Psych Involvement: No (comment)  Admission diagnosis:  Encephalopathy acute [G93.40] Elevated troponin [R77.8] Altered mental status, unspecified altered mental status type [R41.82] Patient Active Problem List   Diagnosis Date Noted   UTI (urinary tract infection)  05/26/2022   Hypokalemia 05/26/2022   Dementia without behavioral disturbance ([place]) 05/26/2022   Encephalopathy acute 05/25/2022   Submandibular gland mass 67/59/1638   Acute metabolic encephalopathy 46/65/9935   Lactic acidosis 05/29/2020   Hypertensive urgency 05/29/2020   Hiatal hernia 09/18/2019   Peripheral artery disease ([place]) 09/18/2019   Acute renal failure superimposed on stage 3a chronic kidney disease ([place]) 09/18/2019   Essential hypertension 09/18/2019   Neck mass 09/18/2019   Weakness 09/17/2019   Pacemaker 05/28/2019   Bilateral sensorineural hearing loss 05/09/2018   CHB (complete heart block) ([place]) 01/20/2018   OSA (obstructive sleep apnea) 01/20/2018   Stokes-Adams syncope 01/09/2018   GERD without esophagitis 01/08/2018   CKD (chronic kidney disease), stage III 01/07/2018   Lower urinary tract infectious disease 06/16/2016   Warthin's tumor 01/01/2016   Dementia ([place]) 03/14/2015   Orthostatic hypotension 08/27/2014   Physical deconditioning 08/27/2014   Abdominal pain in female 08/27/2014   Osteoarthrosis, unspecified whether generalized or localized, shoulder region 01/26/2013   Chest wall pain 12/26/2012   NSVT (nonsustained ventricular tachycardia) ([place]) 10/18/2011   HTN (hypertension) 10/18/2011   HLD (hyperlipidemia) 10/18/2011   PAD (peripheral artery disease) ([place]) 10/18/2011   Syncope 09/30/2011   Pelvic joint pain 09/30/2011   Back pain 09/30/2011   PCP:  Denita Lung, MD Pharmacy:   [id number]Phone: [phone]: [phone]    Social Determinants of Health (SDOH) Interventions    Readmission Risk Interventions     No data to display

## 2022-05-26 NOTE — Progress Notes (Signed)
Overnight progress note  Notified by RN that patient's daughter requested latanoprost eyedrops to be ordered and reported that patient is no longer using timolol. -Discontinue timolol eyedrops and instead order latanoprost which is listed in her home medications, dose verified with family.

## 2022-05-26 NOTE — Progress Notes (Addendum)
PROGRESS NOTE    ELIS RAWLINSON  IOE:[id number] DOB: 03-Nov-1926 DOA: 05/24/2022 PCP: Denita Lung, MD    Brief Narrative:  88 year old female with medical history of CKD stage III, dementia, GERD, hypertension, NSVT, PAD presented to hospital with altered mental status and weakness from her baseline.  Patient was on Bactrim as outpatient for UTI but had become more lethargic and was unable to do ADLs.  At baseline patient was able to do things by herself including walking with a help of a walker.  In the ED, patient was noted to have elevated blood pressure.  EKG showed normal sinus rhythm.  Patient was then admitted hospital for further evaluation and treatment .   Assessment and Plan:  Principal Problem:   Encephalopathy acute Active Problems:   Hypertensive urgency   HLD (hyperlipidemia)   CKD (chronic kidney disease), stage III   UTI (urinary tract infection)   Hypokalemia   Dementia without behavioral disturbance (HCC)   Metabolic encephalopathy Background of dementia.  Secondary to underlying infection/hypertensive urgency.  Continue with better control of blood pressure.  Continue IV Rocephin.  Currently on dysphagia 3 diet.  Respiratory viral panel was negative.  Hypertensive urgency.  On multiple medication.Currently on multiple medication including amlodipine metoprolol hydralazine.  Long-acting metoprolol has been changed to short acting due to difficulty crushing the medication.   Possible UTI. Partially treated with Bactrim as outpatient.  UA is clear.  Urine culture has been sent.  Continue Rocephin IV.   Abnormal troponin Elevated with no concern for chest pain.  EKG unremarkable.  Patient is elderly frail with dementia and is not a good candidate for aggressive intervention.  Palliative care has been consulted for goals of care discussion.  Dementia History of progressive decline as noted by the family.  Continue supportive care.  Deconditioning, debility.   Used to ambulate with a walker in the past.  Has progressively declined.  We will get PT OT evaluation.  Might need skilled nursing facility placement but patient is unable wishes home if possible.  Hypokalemia.  We will replace with potassium orally.  Check levels in AM.  CKD stage IIIb.  We will continue to monitor.   DVT prophylaxis: heparin injection 5,000 Units Start: 05/25/22 0645   Code Status:     Code Status: DNR  Disposition: Uncertain at this time we will get PT evaluation.  Patient is currently at home with family members  Status is: Inpatient  Remains inpatient appropriate because: IV antibiotic, encephalopathy, pending clinical improvement, PT evaluation.   Family Communication: Communicated with the patient's son-in-law at bedside  Consultants:  None  Procedures:  None  Antimicrobials:  Rocephin IV> 05/25/2022  Anti-infectives (From admission, onward)    Start     Dose/Rate Route Frequency Ordered Stop   05/25/22 0800  cefTRIAXone (ROCEPHIN) 1 g in sodium chloride 0.9 % 100 mL IVPB        1 g 200 mL/hr over 30 Minutes Intravenous Every 24 hours 05/25/22 0750        Subjective: Today, patient was seen and examined at bedside.  Patient's son-in-law at the bedside.  He stated that she used to walk with a walker and was doing well until recently.  Patient is confused and disoriented.  Poor historian.  Objective: Vitals:   05/26/22 0351 05/26/22 0730 05/26/22 1123 05/26/22 1523  BP: 138/77 (!) 143/75 (!) 144/69 118/89  Pulse: 80 71 70   Resp: '18 18 18   '$ Temp: 98.5 F (  36.9 C) 98.3 F (36.8 C) 98.4 F (36.9 C) 98 F (36.7 C)  TempSrc: Oral   Oral  SpO2: 97% 97% 94% 96%   No intake or output data in the 24 hours ending 05/26/22 1607 There were no vitals filed for this visit.  Physical Examination: There is no height or weight on file to calculate BMI.   General:  Average built, not in obvious distress, frail appearing elderly and  deconditioned HENT:   No scleral pallor or icterus noted. Oral mucosa is moist.  Chest:  Clear breath sounds.  Diminished breath sounds bilaterally. No crackles or wheezes.  CVS: S1 &S2 heard. No murmur.  Regular rate and rhythm. Abdomen: Soft, nontender, nondistended.  Bowel sounds are heard.   Extremities: No cyanosis, clubbing or edema.  Peripheral pulses are palpable. Psych: Alert, awake but disoriented confused.  Somnolent at times CNS: Confused and disoriented, comprehensible but nonsensical speech. Skin: Warm and dry.  No rashes noted.  Data Reviewed:   CBC: Recent Labs  Lab 05/24/22 2108 05/25/22 0706 05/25/22 0725 05/26/22 0651  WBC 6.3 6.5  --  6.7  NEUTROABS 4.1 4.0  --   --   HGB 13.2 13.5 13.6 13.9  HCT 41.1 40.4 40.0 41.9  MCV 99.8 96.2  --  96.8  PLT 165 168  --  701    Basic Metabolic Panel: Recent Labs  Lab 05/24/22 2108 05/25/22 0706 05/25/22 0725 05/26/22 0651  NA 138 140 139 140  K 4.0 3.5 3.5 3.3*  CL 101 104  --  107  CO2 24 23  --  24  GLUCOSE 109* 103*  --  107*  BUN 17 13  --  12  CREATININE 1.38* 1.21*  --  1.15*  CALCIUM 10.2 9.7  --  9.7    Liver Function Tests: Recent Labs  Lab 05/24/22 2108 05/25/22 0706 05/26/22 0651  AST 39 36 41  ALT '21 24 27  '$ ALKPHOS 45 46 46  BILITOT 0.9 1.2 1.3*  PROT 6.9 7.0 6.9  ALBUMIN 3.7 3.5 3.4*     Radiology Studies: ECHOCARDIOGRAM COMPLETE  Result Date: 05/25/2022    ECHOCARDIOGRAM REPORT   Patient Name:   CHEYLA DUCHEMIN Date of Exam: 05/25/2022 Medical Rec #:  [id number]        Height:       64.0 in Accession #:    [id number]       Weight:       102.2 lb Date of Birth:  1926/02/21        BSA:          1.471 m Patient Age:    55 years         BP:           195/90 mmHg Patient Gender: F                HR:           79 bpm. Exam Location:  Inpatient Procedure: 2D Echo, Color Doppler and Cardiac Doppler Indications:    "Other Abnormalities of the Heart" R00.8  History:        Patient has prior  history of Echocardiogram examinations, most                 recent 01/10/2018. Pacemaker; Risk Factors:Hypertension,                 Dyslipidemia and Sleep Apnea.  Sonographer:    Raquel Sarna Senior RDCS Referring Phys:  [id number] SARA-MAIZ A THOMAS  Sonographer Comments: Some images limited by movement, patient pleasantly confused and playing with probe IMPRESSIONS  1. Left ventricular ejection fraction, by estimation, is >75%. The left ventricle has hyperdynamic function. The left ventricle has no regional wall motion abnormalities. There is mild concentric left ventricular hypertrophy. Left ventricular diastolic parameters are consistent with Grade I diastolic dysfunction (impaired relaxation). Elevated left atrial pressure.  2. Right ventricular systolic function is normal. The right ventricular size is normal. Tricuspid regurgitation signal is inadequate for assessing PA pressure.  3. The mitral valve is degenerative. No evidence of mitral valve regurgitation. Mild mitral stenosis. The mean mitral valve gradient is 4.0 mmHg with average heart rate of 80 bpm. Moderate to severe mitral annular calcification.  4. The aortic valve is tricuspid. There is moderate calcification of the aortic valve. There is moderate thickening of the aortic valve. Aortic valve regurgitation is not visualized. FINDINGS  Left Ventricle: Left ventricular ejection fraction, by estimation, is >75%. The left ventricle has hyperdynamic function. The left ventricle has no regional wall motion abnormalities. The left ventricular internal cavity size was normal in size. There is mild concentric left ventricular hypertrophy. Left ventricular diastolic parameters are consistent with Grade I diastolic dysfunction (impaired relaxation). Elevated left atrial pressure. Right Ventricle: The right ventricular size is normal. No increase in right ventricular wall thickness. Right ventricular systolic function is normal. Tricuspid regurgitation signal is  inadequate for assessing PA pressure. Left Atrium: Left atrial size was normal in size. Right Atrium: Right atrial size was normal in size. Pericardium: There is no evidence of pericardial effusion. Mitral Valve: The mitral valve is degenerative in appearance. Moderate to severe mitral annular calcification. No evidence of mitral valve regurgitation. Mild mitral valve stenosis. MV peak gradient, 8.0 mmHg. The mean mitral valve gradient is 4.0 mmHg with average heart rate of 80 bpm. Tricuspid Valve: The tricuspid valve is normal in structure. Tricuspid valve regurgitation is not demonstrated. Aortic Valve: The aortic valve is tricuspid. There is moderate calcification of the aortic valve. There is moderate thickening of the aortic valve. Aortic valve regurgitation is not visualized. Pulmonic Valve: The pulmonic valve was not well visualized. Pulmonic valve regurgitation is not visualized. Aorta: The aortic root and ascending aorta are structurally normal, with no evidence of dilitation. IAS/Shunts: The interatrial septum was not well visualized.  LEFT VENTRICLE PLAX 2D LVIDd:         3.40 cm   Diastology LVIDs:         2.00 cm   LV e' medial:    2.94 cm/s LV PW:         1.20 cm   LV E/e' medial:  27.2 LV IVS:        1.20 cm   LV e' lateral:   5.66 cm/s LVOT diam:     1.80 cm   LV E/e' lateral: 14.2 LV SV:         45 LV SV Index:   30 LVOT Area:     2.54 cm  RIGHT VENTRICLE RV S prime:     9.14 cm/s TAPSE (M-mode): 1.8 cm LEFT ATRIUM             Index LA diam:        2.90 cm 1.97 cm/m LA Vol (A2C):   49.5 ml 33.66 ml/m LA Vol (A4C):   64.0 ml 43.52 ml/m LA Biplane Vol: 58.4 ml 39.71 ml/m  AORTIC VALVE LVOT Vmax:   79.60 cm/s LVOT  Vmean:  58.400 cm/s LVOT VTI:    0.175 m  AORTA Ao Root diam: 2.70 cm MITRAL VALVE MV Area (PHT): 2.85 cm     SHUNTS MV Area VTI:   1.59 cm     Systemic VTI:  0.18 m MV Peak grad:  8.0 mmHg     Systemic Diam: 1.80 cm MV Mean grad:  4.0 mmHg MV Vmax:       1.41 m/s MV Vmean:      97.2  cm/s MV Decel Time: 266 msec MV E velocity: 80.10 cm/s MV A velocity: 120.00 cm/s MV E/A ratio:  0.67 Mihai Croitoru MD Electronically signed by Sanda Klein MD Signature Date/Time: 05/25/2022/4:01:17 PM    Final    CT ABDOMEN PELVIS WO CONTRAST  Result Date: 05/24/2022 CLINICAL DATA:  Abdominal pain EXAM: CT ABDOMEN AND PELVIS WITHOUT CONTRAST TECHNIQUE: Multidetector CT imaging of the abdomen and pelvis was performed following the standard protocol without IV contrast. RADIATION DOSE REDUCTION: This exam was performed according to the departmental dose-optimization program which includes automated exposure control, adjustment of the mA and/or kV according to patient size and/or use of iterative reconstruction technique. COMPARISON:  05/30/2020 FINDINGS: Lower chest: Dependent opacities in the lower lobes, likely dependent atelectasis. Hepatobiliary: No focal hepatic abnormality.  Prior cholecystectomy. Pancreas: No focal abnormality or ductal dilatation. Spleen: No focal abnormality.  Normal size. Adrenals/Urinary Tract: Left interpolar calyceal diverticulum again noted containing layering stones, unchanged since prior study. Renal vascular calcifications in the renal hila bilaterally. No hydronephrosis. Adrenal glands and urinary bladder unremarkable. Stomach/Bowel: Extensive sigmoid diverticulosis. No active diverticulitis. Diffuse gaseous distention of bowel without evidence of obstruction. This may reflect mild ileus. Vascular/Lymphatic: Diffuse aortic and iliac calcifications. No evidence of aneurysm or adenopathy. Reproductive: Prior hysterectomy.  No adnexal masses. Other: No free fluid or free air. Musculoskeletal: No acute bony abnormality. 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 No ureteral stones or hydronephrosis. Dependent atelectasis in the lower lobes. Diffuse gaseous distention of bowel may reflect ileus. Aortoiliac atherosclerosis. Electronically Signed   By:  Rolm Baptise M.D.   On: 05/24/2022 23:18   CT Head Wo Contrast  Result Date: 05/24/2022 CLINICAL DATA:  Memory loss Mental status change, unknown cause EXAM: CT HEAD WITHOUT CONTRAST TECHNIQUE: Contiguous axial images were obtained from the base of the skull through the vertex without intravenous contrast. RADIATION DOSE REDUCTION: This exam was performed according to the departmental dose-optimization program which includes automated exposure control, adjustment of the mA and/or kV according to patient size and/or use of iterative reconstruction technique. COMPARISON:  03/23/2021 FINDINGS: Brain: There is atrophy and chronic small vessel disease changes. No acute intracranial abnormality. Specifically, no hemorrhage, hydrocephalus, mass lesion, acute infarction, or significant intracranial injury. Vascular: No hyperdense vessel or unexpected calcification. Extensive vertebral and carotid vascular calcifications. Skull: No acute calvarial abnormality. Sinuses/Orbits: No acute findings Other: None IMPRESSION: Atrophy, chronic microvascular disease. No acute intracranial abnormality. Electronically Signed   By: Rolm Baptise M.D.   On: 05/24/2022 23:12   DG Chest Port 1 View  Result Date: 05/24/2022 CLINICAL DATA:  Questionable sepsis - evaluate for abnormality EXAM: PORTABLE CHEST 1 VIEW COMPARISON:  05/29/2020 FINDINGS: Left pacer remains in place, unchanged. Heart and mediastinal contours are within normal limits. No focal opacities or effusions. No acute bony abnormality. Prior right shoulder replacement, unchanged. IMPRESSION: No active cardiopulmonary disease. Electronically Signed   By: Rolm Baptise M.D.   On: 05/24/2022 21:02      LOS:  1 day    Flora Lipps, MD [facility] Available via Epic secure chat 7am-7pm After these hours, please refer to coverage provider listed on [email or web address] 05/26/2022, 4:07 PM

## 2022-05-27 DIAGNOSIS — Z66 Do not resuscitate: Secondary | ICD-10-CM

## 2022-05-27 DIAGNOSIS — R627 Adult failure to thrive: Secondary | ICD-10-CM

## 2022-05-27 DIAGNOSIS — F0390 Unspecified dementia without behavioral disturbance: Secondary | ICD-10-CM | POA: Diagnosis not present

## 2022-05-27 DIAGNOSIS — E7800 Pure hypercholesterolemia, unspecified: Secondary | ICD-10-CM | POA: Diagnosis not present

## 2022-05-27 DIAGNOSIS — Z515 Encounter for palliative care: Secondary | ICD-10-CM

## 2022-05-27 DIAGNOSIS — R7989 Other specified abnormal findings of blood chemistry: Secondary | ICD-10-CM

## 2022-05-27 DIAGNOSIS — Z7189 Other specified counseling: Secondary | ICD-10-CM

## 2022-05-27 DIAGNOSIS — I160 Hypertensive urgency: Secondary | ICD-10-CM | POA: Diagnosis not present

## 2022-05-27 DIAGNOSIS — G9340 Encephalopathy, unspecified: Secondary | ICD-10-CM | POA: Diagnosis not present

## 2022-05-27 LAB — CBC
HCT: 41.3 % (ref 36.0–46.0)
Hemoglobin: 13.5 g/dL (ref 12.0–15.0)
MCH: 31.5 pg (ref 26.0–34.0)
MCHC: 32.7 g/dL (ref 30.0–36.0)
MCV: 96.5 fL (ref 80.0–100.0)
Platelets: 206 10*3/uL (ref 150–400)
RBC: 4.28 MIL/uL (ref 3.87–5.11)
RDW: 13.8 % (ref 11.5–15.5)
WBC: 6.1 10*3/uL (ref 4.0–10.5)
nRBC: 0 % (ref 0.0–0.2)

## 2022-05-27 LAB — BASIC METABOLIC PANEL
Anion gap: 9 (ref 5–15)
BUN: 22 mg/dL (ref 8–23)
CO2: 22 mmol/L (ref 22–32)
Calcium: 10.1 mg/dL (ref 8.9–10.3)
Chloride: 110 mmol/L (ref 98–111)
Creatinine, Ser: 1.37 mg/dL — ABNORMAL HIGH (ref 0.44–1.00)
GFR, Estimated: 36 mL/min — ABNORMAL LOW (ref >60)
Glucose, Bld: 114 mg/dL — ABNORMAL HIGH (ref 70–99)
Potassium: 4.2 mmol/L (ref 3.5–5.1)
Sodium: 141 mmol/L (ref 135–145)

## 2022-05-27 LAB — MAGNESIUM: Magnesium: 2.1 mg/dL (ref 1.7–2.4)

## 2022-05-27 MED ORDER — HYDRALAZINE HCL 25 MG PO TABS
25.0000 mg | ORAL_TABLET | Freq: Three times a day (TID) | ORAL | Status: DC
  Administered 2022-05-27 – 2022-05-28 (×3): 25 mg via ORAL
  Filled 2022-05-27 (×3): qty 1

## 2022-05-27 NOTE — Consult Note (Signed)
Consultation Note Date: 05/27/2022   Patient Name: Julie Bass  DOB: 1926-08-05  MRN: [id number]  Age / Sex: 88 y.o., female  PCP: Denita Lung, MD Referring Physician: Flora Lipps, MD  Reason for Consultation: Establishing goals of care  HPI/Patient Profile: 88 y.o. female  with past medical history of CKD stage III, dementia, GERD, hypertension, NSVT, and PAD  admitted on 05/24/2022 with AMS.  Found to have elevated blood pressure in the ED.  Patient with ongoing metabolic encephalopathy throughout hospitalization.  She has been started on IV Rocephin for concern of UTI.  She continues on treatment for hypertensive urgency.  PMT consulted for goals of care.  Clinical Assessment and Goals of Care: I have reviewed medical records including EPIC notes, labs and imaging, received report from RN, assessed the patient and then spoke with patient's daughter [name] to discuss diagnosis prognosis, [place], EOL wishes, disposition and options.  Patient does not interact with me during my assessment.  She remains asleep despite voice and gentle touch.  RN confirms patient is mostly asleep.  I introduced Palliative Medicine as specialized medical care for people living with serious illness. It focuses on providing relief from the symptoms and stress of a serious illness. The goal is to improve quality of life for both the patient and the family.  [name] shares patient has been declining at home.  She shares patient was occasionally ambulatory but this has decreased significantly.  She tells me she needs assistance with all ADLs.  She tells me for the past month patient is spending much more time sleeping.  She tells me she is typically unaware where she is.  She tells me she has a terrible appetite and sometimes just chews food for prolonged period of time then spits it out.   We discussed patient's current illness and what it means in the larger context  of patient's on-going co-morbidities.  Natural disease trajectory and expectations at EOL were discussed.  I attempted to elicit values and goals of care important to the patient.  Patient would like to be at home.  The difference between aggressive medical intervention and comfort care was considered in light of the patient's goals of care.   Discussed with [name] the importance of continued conversation with family and the medical providers regarding overall plan of care and treatment options, ensuring decisions are within the context of the patients values and GOCs.    Hospice and Palliative Care services outpatient were explained and offered.  Family has been receiving palliative care services but they are interested in transitioning to hospice services -[facility] [place].  We discussed the type of care provided by hospice as well as philosophy of hospice care and daughter agrees.  Questions and concerns were addressed. The family was encouraged to call with questions or concerns.  Primary Decision Maker NEXT OF KIN daughter [name]    SUMMARY OF RECOMMENDATIONS   Referral made to [facility] [place] per family request Continue current medical interventions until stable to discharge home CODE STATUS DNR  Prognosis:  < 6 months  Discharge Planning: Home with Hospice      Primary Diagnoses: Present on Admission:  Encephalopathy acute  Hypertensive urgency  HLD (hyperlipidemia)  CKD (chronic kidney disease), stage III   I have reviewed the medical record, interviewed the patient and family, and examined the patient. The following aspects are pertinent.  Past Medical History:  Diagnosis Date   Arthritis    Chronic kidney  disease    KIDNEY STONES   Dementia ([place])    BEGINNING STAGES    Depression    GERD (gastroesophageal reflux disease)    Glaucoma    Hiatal hernia    HTN (hypertension) 10/18/2011   Hyperlipemia 10/18/2011   NSVT (nonsustained  ventricular tachycardia) ([place]) 10/18/2011   Osteoporosis    PAD (peripheral artery disease) ([place]) 10/18/2011   h/o left SFA stent   Social History   Socioeconomic History   Marital status: Widowed    Spouse name: Not on file   Number of children: 4   Years of education: Not on file   Highest education level: Not on file  Occupational History   Not on file  Tobacco Use   Smoking status: Former    Types: Cigarettes    Quit date: 05/07/1984    Years since quitting: 38.0   Smokeless tobacco: Never  Substance and Sexual Activity   Alcohol use: No   Drug use: No   Sexual activity: Not Currently  Other Topics Concern   Not on file  Social History Narrative   Not on file   Social Determinants of Health   Financial Resource Strain: Not on file  Food Insecurity: Not on file  Transportation Needs: Not on file  Physical Activity: Not on file  Stress: Not on file  Social Connections: Not on file   Family History  Problem Relation Age of Onset   Throat cancer Father    Cancer Mother    CAD Son    Breast cancer Sister        cervical cancer   Colon cancer Brother        throat cancer   Pulmonary embolism Daughter    Hypertension Daughter    Scheduled Meds:  amLODipine  5 mg Oral Daily   cilostazol  50 mg Oral BID   citalopram  20 mg Oral Daily   ezetimibe  10 mg Oral Daily   feeding supplement  237 mL Oral TID BM   heparin  5,000 Units Subcutaneous Q8H   hydrALAZINE  25 mg Oral Q8H   latanoprost  1 drop Both Eyes BID   metoprolol tartrate  25 mg Oral BID   omega-3 acid ethyl esters  1 g Oral QPM   sodium chloride flush  3 mL Intravenous Q12H   Continuous Infusions:  cefTRIAXone (ROCEPHIN)  IV 1 g (05/27/22 0901)   PRN Meds:.acetaminophen **OR** acetaminophen, albuterol, ondansetron **OR** ondansetron (ZOFRAN) IV, pantoprazole Allergies  Allergen Reactions   Crestor [Rosuvastatin Calcium] Other (See Comments)    Muscle pain, myalgia   Lipitor [Atorvastatin  Calcium] Other (See Comments)    Muscle pain, myalgia   Review of Systems  Unable to perform ROS: Dementia    Physical Exam Constitutional:      General: She is not in acute distress.    Appearance: She is ill-appearing.     Comments: Does not wake to voice or touch  Pulmonary:     Effort: Pulmonary effort is normal.  Skin:    General: Skin is warm and dry.     Vital Signs: BP (!) 161/63 (BP Location: Right Arm)   Pulse 64   Temp 98.1 F (36.7 C) (Oral)   Resp 18   SpO2 100%  Pain Scale: 0-10   Pain Score: 0-No pain   SpO2: SpO2: 100 % O2 Device:SpO2: 100 % O2 Flow Rate: .   IO: Intake/output summary:  Intake/Output Summary (Last 24 hours) at 05/27/2022  [place] filed at 05/26/2022 1754 Gross per 24 hour  Intake 200 ml  Output --  Net 200 ml    LBM: Last BM Date : 05/27/22 Baseline Weight:   Most recent weight:       Palliative Assessment/Data: PPS 20%     *Please note that this is a verbal dictation therefore any spelling or grammatical errors are due to the "[employer]" system interpretation.  Juel Burrow, DNP, AGNP-C Palliative Medicine Team [phone] Pager: [phone]

## 2022-05-27 NOTE — Progress Notes (Signed)
PROGRESS NOTE    Julie Bass  WCB:[id number] DOB: Sep 10, 1926 DOA: 05/24/2022 PCP: Denita Lung, MD    Brief Narrative:   88 year old female with medical history of CKD stage III, dementia, GERD, hypertension, NSVT, PAD presented to hospital with altered mental status and weakness from her baseline.  Patient was on Bactrim as outpatient for UTI but had become more lethargic and was unable to do ADLs.  At baseline,patient was able to do things by herself including walking with a help of a walker.  In the ED, patient was noted to have elevated blood pressure.  EKG showed normal sinus rhythm.  Patient was then admitted hospital for further evaluation and treatment .   Assessment and Plan:  Principal Problem:   Encephalopathy acute Active Problems:   Hypertensive urgency   HLD (hyperlipidemia)   CKD (chronic kidney disease), stage III   UTI (urinary tract infection)   Hypokalemia   Dementia without behavioral disturbance (HCC)   Metabolic encephalopathy On the background of dementia.  Secondary to underlying infection/hypertensive urgency.  Continue with better control of blood pressure.  Continue IV Rocephin.  Currently on dysphagia 3 diet.  Respiratory viral panel was negative.  Hypertensive urgency.  On multiple medications. Currently on multiple medication including amlodipine, metoprolol.  Will add hydralazine 3 times daily for now.  Long-acting metoprolol has been changed to short acting due to difficulty crushing the medication.  Blood pressure is elevated at this time.   Possible UTI. Partially treated with Bactrim as outpatient.  UA is clear.  Urine culture no growth so far.  Continue Rocephin IV.   Abnormal troponin Elevated troponins with no mention of chest pain.  EKG unremarkable.  Patient is elderly frail with dementia and is not a good candidate for aggressive intervention.  Palliative care has been consulted for goals of care discussion.  Patient's family however  wishes to have a cardiology consultation.  Communicated with cardiology for consultation.    Dementia History of progressive decline as noted by the family.  Continue supportive care.  Deconditioning, debility.  Patient used to ambulate with a walker in the past.  Has progressively declined.  PT evaluation pending.  OT recommend home health OT.  Hypokalemia.  Improved after replacement.  Latest potassium of 4.2.  CKD stage IIIb.  We will continue to monitor.  Goals of care.  Palliative care has been consulted.  At this time family is interested in hospice level of care but wish Cardiology consultation.   DVT prophylaxis: heparin injection 5,000 Units Start: 05/25/22 0645   Code Status:     Code Status: DNR  Disposition: Likely home with hospice, patient is currently at home with family members  Status is: Inpatient  Remains inpatient appropriate because: IV antibiotic, encephalopathy, pending clinical improvement, pending PT evaluation, possible hospice discussed.   Family Communication:  Communicated with the patient's son-in-law at bedside on 05/26/2022  Consultants:  Palliative care Cardiology  Procedures:  None  Antimicrobials:  Rocephin IV> 05/25/2022  Anti-infectives (From admission, onward)    Start     Dose/Rate Route Frequency Ordered Stop   05/25/22 0800  cefTRIAXone (ROCEPHIN) 1 g in sodium chloride 0.9 % 100 mL IVPB        1 g 200 mL/hr over 30 Minutes Intravenous Every 24 hours 05/25/22 0750        Subjective: Today, patient was seen and examined at bedside.  Appears to be very weak and sleepy.  As per the nursing staff patient  has been eating some.  Poor historian.  Objective: Vitals:   05/26/22 2345 05/27/22 0438 05/27/22 0806 05/27/22 1040  BP: 138/63 (!) 143/74 (!) 170/75 (!) 186/78  Pulse: 62 63 74   Resp: 16 18    Temp: 97.9 F (36.6 C) 98.5 F (36.9 C)    TempSrc: Oral     SpO2: 96% 98% 100%     Intake/Output Summary (Last 24 hours) at  05/27/2022 1144 Last data filed at 05/26/2022 1754 Gross per 24 hour  Intake 200 ml  Output --  Net 200 ml   There were no vitals filed for this visit.  General:  frail appearing elderly and deconditioned, mildly somnolent HENT:   No scleral pallor or icterus noted. Oral mucosa is moist.  Chest:  Clear breath sounds.  Diminished breath sounds bilaterally. No crackles or wheezes.  CVS: S1 &S2 heard. No murmur.  Regular rate and rhythm. Abdomen: Soft, nontender, nondistended.  Bowel sounds are heard.   Extremities: No cyanosis, clubbing or edema.  Peripheral pulses are palpable. Psych: Weak elderly female somnolent CNS: Moves extremities, somnolent, confused. Skin: Warm and dry.  No rashes noted.   Data Reviewed:   CBC: Recent Labs  Lab 05/24/22 2108 05/25/22 0706 05/25/22 0725 05/26/22 0651 05/27/22 0214  WBC 6.3 6.5  --  6.7 6.1  NEUTROABS 4.1 4.0  --   --   --   HGB 13.2 13.5 13.6 13.9 13.5  HCT 41.1 40.4 40.0 41.9 41.3  MCV 99.8 96.2  --  96.8 96.5  PLT 165 168  --  196 206     Basic Metabolic Panel: Recent Labs  Lab 05/24/22 2108 05/25/22 0706 05/25/22 0725 05/26/22 0651 05/27/22 0214  NA 138 140 139 140 141  K 4.0 3.5 3.5 3.3* 4.2  CL 101 104  --  107 110  CO2 24 23  --  24 22  GLUCOSE 109* 103*  --  107* 114*  BUN 17 13  --  12 22  CREATININE 1.38* 1.21*  --  1.15* 1.37*  CALCIUM 10.2 9.7  --  9.7 10.1  MG  --   --   --   --  2.1     Liver Function Tests: Recent Labs  Lab 05/24/22 2108 05/25/22 0706 05/26/22 0651  AST 39 36 41  ALT '21 24 27  '$ ALKPHOS 45 46 46  BILITOT 0.9 1.2 1.3*  PROT 6.9 7.0 6.9  ALBUMIN 3.7 3.5 3.4*      Radiology Studies: ECHOCARDIOGRAM COMPLETE  Result Date: 05/25/2022    ECHOCARDIOGRAM REPORT   Patient Name:   Julie Bass Date of Exam: 05/25/2022 Medical Rec #:  [id number]        Height:       64.0 in Accession #:    [id number]       Weight:       102.2 lb Date of Birth:  11/12/1926        BSA:          1.471 m  Patient Age:    45 years         BP:           195/90 mmHg Patient Gender: F                HR:           79 bpm. Exam Location:  Inpatient Procedure: 2D Echo, Color Doppler and Cardiac Doppler Indications:    "Other Abnormalities of  the Heart" R00.8  History:        Patient has prior history of Echocardiogram examinations, most                 recent 01/10/2018. Pacemaker; Risk Factors:Hypertension,                 Dyslipidemia and Sleep Apnea.  Sonographer:    Raquel Sarna Senior RDCS Referring Phys: [id number] SARA-MAIZ A THOMAS  Sonographer Comments: Some images limited by movement, patient pleasantly confused and playing with probe IMPRESSIONS  1. Left ventricular ejection fraction, by estimation, is >75%. The left ventricle has hyperdynamic function. The left ventricle has no regional wall motion abnormalities. There is mild concentric left ventricular hypertrophy. Left ventricular diastolic parameters are consistent with Grade I diastolic dysfunction (impaired relaxation). Elevated left atrial pressure.  2. Right ventricular systolic function is normal. The right ventricular size is normal. Tricuspid regurgitation signal is inadequate for assessing PA pressure.  3. The mitral valve is degenerative. No evidence of mitral valve regurgitation. Mild mitral stenosis. The mean mitral valve gradient is 4.0 mmHg with average heart rate of 80 bpm. Moderate to severe mitral annular calcification.  4. The aortic valve is tricuspid. There is moderate calcification of the aortic valve. There is moderate thickening of the aortic valve. Aortic valve regurgitation is not visualized. FINDINGS  Left Ventricle: Left ventricular ejection fraction, by estimation, is >75%. The left ventricle has hyperdynamic function. The left ventricle has no regional wall motion abnormalities. The left ventricular internal cavity size was normal in size. There is mild concentric left ventricular hypertrophy. Left ventricular diastolic parameters are  consistent with Grade I diastolic dysfunction (impaired relaxation). Elevated left atrial pressure. Right Ventricle: The right ventricular size is normal. No increase in right ventricular wall thickness. Right ventricular systolic function is normal. Tricuspid regurgitation signal is inadequate for assessing PA pressure. Left Atrium: Left atrial size was normal in size. Right Atrium: Right atrial size was normal in size. Pericardium: There is no evidence of pericardial effusion. Mitral Valve: The mitral valve is degenerative in appearance. Moderate to severe mitral annular calcification. No evidence of mitral valve regurgitation. Mild mitral valve stenosis. MV peak gradient, 8.0 mmHg. The mean mitral valve gradient is 4.0 mmHg with average heart rate of 80 bpm. Tricuspid Valve: The tricuspid valve is normal in structure. Tricuspid valve regurgitation is not demonstrated. Aortic Valve: The aortic valve is tricuspid. There is moderate calcification of the aortic valve. There is moderate thickening of the aortic valve. Aortic valve regurgitation is not visualized. Pulmonic Valve: The pulmonic valve was not well visualized. Pulmonic valve regurgitation is not visualized. Aorta: The aortic root and ascending aorta are structurally normal, with no evidence of dilitation. IAS/Shunts: The interatrial septum was not well visualized.  LEFT VENTRICLE PLAX 2D LVIDd:         3.40 cm   Diastology LVIDs:         2.00 cm   LV e' medial:    2.94 cm/s LV PW:         1.20 cm   LV E/e' medial:  27.2 LV IVS:        1.20 cm   LV e' lateral:   5.66 cm/s LVOT diam:     1.80 cm   LV E/e' lateral: 14.2 LV SV:         45 LV SV Index:   30 LVOT Area:     2.54 cm  RIGHT VENTRICLE RV S prime:  9.14 cm/s TAPSE (M-mode): 1.8 cm LEFT ATRIUM             Index LA diam:        2.90 cm 1.97 cm/m LA Vol (A2C):   49.5 ml 33.66 ml/m LA Vol (A4C):   64.0 ml 43.52 ml/m LA Biplane Vol: 58.4 ml 39.71 ml/m  AORTIC VALVE LVOT Vmax:   79.60 cm/s LVOT  Vmean:  58.400 cm/s LVOT VTI:    0.175 m  AORTA Ao Root diam: 2.70 cm MITRAL VALVE MV Area (PHT): 2.85 cm     SHUNTS MV Area VTI:   1.59 cm     Systemic VTI:  0.18 m MV Peak grad:  8.0 mmHg     Systemic Diam: 1.80 cm MV Mean grad:  4.0 mmHg MV Vmax:       1.41 m/s MV Vmean:      97.2 cm/s MV Decel Time: 266 msec MV E velocity: 80.10 cm/s MV A velocity: 120.00 cm/s MV E/A ratio:  0.67 Mihai Croitoru MD Electronically signed by Sanda Klein MD Signature Date/Time: 05/25/2022/4:01:17 PM    Final       LOS: 2 days    Flora Lipps, MD [facility] Available via Epic secure chat 7am-7pm After these hours, please refer to coverage provider listed on [email or web address] 05/27/2022, 11:44 AM

## 2022-05-27 NOTE — TOC Progression Note (Signed)
Transition of Care [facility]) - Progression Note    Patient Details  Name: Julie Bass MRN: [id number] Date of Birth: January 31, 1926  [facility] CM/SW Contact  Pollie Friar, RN Phone Number: 05/27/2022, 3:12 PM  Clinical Narrative:    Family has decided to d/c home with hospice services when pt is r medically ready. They asked to use [facility]. Cheri with [facility] sent referral. She has talked with the family and currently no DME needed. Family will provide transport home when ready for d/c.  TOC following.   Expected Discharge Plan: Home/Self Care Barriers to Discharge: Continued Medical Work up  Expected Discharge Plan and Services Expected Discharge Plan: Home/Self Care   Discharge Planning Services: CM Consult   Living arrangements for the past 2 months: Single Family Home                                       Social Determinants of Health (SDOH) Interventions    Readmission Risk Interventions     No data to display

## 2022-05-27 NOTE — Evaluation (Signed)
Occupational Therapy Evaluation Patient Details Name: Julie Bass MRN: [id number] DOB: [birth year] Today's Date: 05/27/2022   History of Present Illness Pt is a 88 yo female admitted for AMS changes and possible UTI. Pt with mildly elevated troponin but no treatment warrented per chart.  Pt has being treated for UTI and is on antibiotics currently.  PMH:  CKD III, dementia, GERD, HTN, NSVT, PAD.   Clinical Impression   Pt admitted with the above diagnosis and has the deficits outlined below. Pt would benefit from cont OT to increase independence with basic adls and adl transfers back to baseline level of supervision with mobility and assist with bathing for adls and otherwise supervision with adls. Pt lives with her cousin who is there 24/7 to assist. Per daugther, pt sits in recliner most of day but could get up with supervision only to walk to bathroom or to get meals. Pt did feed self, toilet and dress self with supervision but had aid to assist with bathing 2x a week. This seems to be a cycle of decline, get HHPT, get better, repeat. Family wants to take pt home and avoid SNF dc if possible. Pt needs to be at level she can walk to bathroom with min assist to d/c home.  Will plan for HHOT and modify if pt does not progress.        Recommendations for follow up therapy are one component of a multi-disciplinary discharge planning process, led by the attending physician.  Recommendations may be updated based on patient status, additional functional criteria and insurance authorization.   Follow Up Recommendations  Home health OT    Assistance Recommended at Discharge Frequent or constant Supervision/Assistance  Patient can return home with the following A lot of help with walking and/or transfers;A lot of help with bathing/dressing/bathroom;Assistance with feeding;Assistance with cooking/housework;Direct supervision/assist for medications management;Direct supervision/assist for financial  management;Assist for transportation;Help with stairs or ramp for entrance    Functional Status Assessment  Patient has had a recent decline in their functional status and demonstrates the ability to make significant improvements in function in a reasonable and predictable amount of time.  Equipment Recommendations  None recommended by OT    Recommendations for Other Services Speech consult     Precautions / Restrictions Precautions Precautions: Fall Restrictions Weight Bearing Restrictions: No      Mobility Bed Mobility Overal bed mobility: Needs Assistance Bed Mobility: Supine to Sit, Sit to Supine     Supine to sit: Max assist Sit to supine: Total assist   General bed mobility comments: Pt very resistant to mobility but once mobility started for her with calming voice, pt did not resist and assisted minimally.    Transfers Overall transfer level: Needs assistance Equipment used: Rolling walker (2 wheels) Transfers: Sit to/from Stand Sit to Stand: Mod assist           General transfer comment: Pt required max assist to get forward movement started to stand but once walker put in front of her pt did assist with bed raised.      Balance Overall balance assessment: Needs assistance Sitting-balance support: Feet supported Sitting balance-Leahy Scale: Poor     Standing balance support: Bilateral upper extremity supported, During functional activity, Reliant on assistive device for balance Standing balance-Leahy Scale: Poor                             ADL either performed or assessed with  clinical judgement   ADL Overall ADL's : Needs assistance/impaired Eating/Feeding: Maximal assistance;Sitting Eating/Feeding Details (indicate cue type and reason): Pt lethargic and confused. Pt required step by step commands to open mouth, close mouth around utensil, chew, swallow. Grooming: Wash/dry hands;Wash/dry face;Moderate assistance;Sitting   Upper Body  Bathing: Maximal assistance   Lower Body Bathing: Total assistance;Sit to/from stand   Upper Body Dressing : Maximal assistance;Sitting   Lower Body Dressing: Total assistance;+2 for physical assistance;Sit to/from stand   Toilet Transfer: Moderate assistance;Stand-pivot;BSC/3in1;Standard walker Toilet Transfer Details (indicate cue type and reason): Pt accustomed to using walker and did better when walker placed in front of her.  Pt very fearful of falling and c/o being cold through session.  Daughter states she always has  fear of falling and is always cold. Toileting- Clothing Manipulation and Hygiene: +2 for physical assistance;Sit to/from stand;Maximal assistance       Functional mobility during ADLs: Maximal assistance General ADL Comments: Pt very limited with adls this am due to fear of falling and lethargy while in bed. Pt crying during session and resistant to mobility but if talked through it did participate and allow therapist to direct movement.     Vision Baseline Vision/History:  (legally blind R eye) Ability to See in Adequate Light: 0 Adequate Patient Visual Report: No change from baseline Vision Assessment?: Vision impaired- to be further tested in functional context     Perception     Praxis      Pertinent Vitals/Pain Pain Assessment Pain Assessment: Faces Faces Pain Scale: Hurts little more Pain Location: maybe back? Pain Descriptors / Indicators: Aching, Discomfort, Grimacing, Moaning Pain Intervention(s): Monitored during session, Repositioned, Limited activity within patient's tolerance     Hand Dominance Right   Extremity/Trunk Assessment Upper Extremity Assessment Upper Extremity Assessment: Generalized weakness   Lower Extremity Assessment Lower Extremity Assessment: Defer to PT evaluation   Cervical / Trunk Assessment Cervical / Trunk Assessment: Kyphotic   Communication Communication Communication: Other (comment) (Pt dysarthric on eval.   Difficult to understand.)   Cognition Arousal/Alertness: Lethargic Behavior During Therapy: Anxious Overall Cognitive Status: Impaired/Different from baseline Area of Impairment: Orientation, Attention, Memory, Following commands, Safety/judgement, Awareness, Problem solving                 Orientation Level: Disoriented to, Place, Time, Situation Current Attention Level: Focused Memory: Decreased recall of precautions, Decreased short-term memory Following Commands: Follows one step commands inconsistently Safety/Judgement: Decreased awareness of safety, Decreased awareness of deficits Awareness: Intellectual Problem Solving: Slow processing, Requires verbal cues, Requires tactile cues, Decreased initiation, Difficulty sequencing General Comments: Daughter states pt usually follows commands consistently but does have significant dementia. Pt is not normally this lethargic per daughter.     General Comments  Pt fearful of all movement and tearful during session but could easily be talked through activities.  Unable to understand speech through most of session.    Exercises     Shoulder Instructions      Home Living Family/patient expects to be discharged to:: Private residence Living Arrangements: Other relatives Available Help at Discharge: Available 24 hours/day Type of Home: House Home Access: Stairs to enter [employer] of Steps: 4 Entrance Stairs-Rails: Right;Left;Can reach both Home Layout: One level     Bathroom Shower/Tub: Walk-in shower;Door   [employer] Toilet: Handicapped height Bathroom Accessibility: Yes How Accessible: Accessible via walker Home Equipment: Rolling Walker (2 wheels);BSC/3in1;Shower seat;Grab bars - toilet;Grab bars - tub/shower   Additional Comments: Pt has caregiver that gets  her in shower 2x week.      Prior Functioning/Environment Prior Level of Function : Needs assist  Cognitive Assist : Mobility (cognitive);ADLs  (cognitive) Mobility (Cognitive): Intermittent cues ADLs (Cognitive): Intermittent cues Physical Assist : Mobility (physical);ADLs (physical) Mobility (physical): Gait;Stairs ADLs (physical): Bathing;Toileting;IADLs Mobility Comments: Pt uses walker at all times but only gets up to walk to bathroom and get meals.  Otherwise sits in recliner most of the day. ADLs Comments: Pt can feed self, groom and usually toilet and dress self. Aide comes 2x week a to get pt in the shower.        OT Problem List: Decreased strength;Decreased activity tolerance;Impaired balance (sitting and/or standing);Decreased cognition;Decreased safety awareness;Decreased knowledge of use of DME or AE;Pain      OT Treatment/Interventions: Self-care/ADL training;Therapeutic activities;Balance training    OT Goals(Current goals can be found in the care plan section) Acute Rehab OT Goals Patient Stated Goal: none stated OT Goal Formulation: With family Time For Goal Achievement: 06/10/22 Potential to Achieve Goals: Fair ADL Goals Pt Will Perform Eating: with supervision;sitting Pt Will Perform Grooming: with supervision;sitting Pt Will Perform Lower Body Dressing: with min assist;sit to/from stand Pt Will Transfer to Toilet: with supervision;ambulating;grab bars Pt Will Perform Toileting - Clothing Manipulation and hygiene: with supervision;sit to/from stand Additional ADL Goal #1: Pt will follow 90% of simple one step commands without cues to increase participation in therapy.  OT Frequency: Min 2X/week    Co-evaluation              AM-PAC OT "6 Clicks" Daily Activity     Outcome Measure Help from another person eating meals?: A Lot Help from another person taking care of personal grooming?: A Lot Help from another person toileting, which includes using toliet, bedpan, or urinal?: Total Help from another person bathing (including washing, rinsing, drying)?: Total Help from another person to put on and  taking off regular upper body clothing?: A Lot Help from another person to put on and taking off regular lower body clothing?: Total 6 Click Score: 9   End of Session Equipment Utilized During Treatment: Rolling walker (2 wheels);Gait belt Nurse Communication: Mobility status  Activity Tolerance: Patient limited by lethargy Patient left: in bed;with call bell/phone within reach;with bed alarm set  OT Visit Diagnosis: Unsteadiness on feet (R26.81)                Time: 9518-8416 OT Time Calculation (min): 33 min Charges:  OT General Charges $OT Visit: 1 Visit OT Evaluation $OT Eval Moderate Complexity: 1 Mod OT Treatments $Self Care/Home Management : 8-22 mins  Glenford Peers 05/27/2022, 8:40 AM

## 2022-05-27 NOTE — Progress Notes (Signed)
   This pt has been referred to hospice care at home. I have reviewed the chart and been able to connect with daughter. I have introduced the daughter to the support hospice has to offer the pt and family. The daughter [name], is in agreement with hospice philosophy and care. I have reviewed with our MD and the pt has been approved for hospice services at home once d/c.   The daughter does not feel any additional equipment is  needed in the home at this time. She is receptive to our staff making recommendations as she continues to decline based on our observations when visiting the pt.   Webb Silversmith RN BSN [facility] [phone]

## 2022-05-27 NOTE — Plan of Care (Signed)
  Problem: Education: Goal: Knowledge of General Education information will improve Description: Including pain rating scale, medication(s)/side effects and non-pharmacologic comfort measures 05/27/2022 1843 by Laure Kidney, RN Outcome: Progressing 05/27/2022 1841 by Laure Kidney, RN Outcome: Progressing   Problem: Health Behavior/Discharge Planning: Goal: Ability to manage health-related needs will improve 05/27/2022 1843 by Laure Kidney, RN Outcome: Progressing 05/27/2022 1841 by Laure Kidney, RN Outcome: Progressing   Problem: Clinical Measurements: Goal: Ability to maintain clinical measurements within normal limits will improve 05/27/2022 1843 by Laure Kidney, RN Outcome: Progressing 05/27/2022 1841 by Laure Kidney, RN Outcome: Progressing Goal: Will remain free from infection 05/27/2022 1843 by Laure Kidney, RN Outcome: Progressing 05/27/2022 1841 by Laure Kidney, RN Outcome: Progressing Goal: Diagnostic test results will improve 05/27/2022 1843 by Laure Kidney, RN Outcome: Progressing 05/27/2022 1841 by Laure Kidney, RN Outcome: Progressing Goal: Respiratory complications will improve 05/27/2022 1843 by Laure Kidney, RN Outcome: Progressing 05/27/2022 1841 by Laure Kidney, RN Outcome: Progressing Goal: Cardiovascular complication will be avoided 05/27/2022 1843 by Laure Kidney, RN Outcome: Progressing 05/27/2022 1841 by Laure Kidney, RN Outcome: Progressing   Problem: Activity: Goal: Risk for activity intolerance will decrease 05/27/2022 1843 by Laure Kidney, RN Outcome: Progressing 05/27/2022 1841 by Laure Kidney, RN Outcome: Progressing   Problem: Nutrition: Goal: Adequate nutrition will be maintained 05/27/2022 1843 by Laure Kidney, RN Outcome: Progressing 05/27/2022 1841 by Laure Kidney, RN Outcome: Progressing   Problem: Coping: Goal: Level of anxiety will decrease 05/27/2022 1843 by Laure Kidney, RN Outcome:  Progressing 05/27/2022 1841 by Laure Kidney, RN Outcome: Progressing   Problem: Elimination: Goal: Will not experience complications related to bowel motility 05/27/2022 1843 by Laure Kidney, RN Outcome: Progressing 05/27/2022 1841 by Laure Kidney, RN Outcome: Progressing Goal: Will not experience complications related to urinary retention 05/27/2022 1843 by Laure Kidney, RN Outcome: Progressing 05/27/2022 1841 by Laure Kidney, RN Outcome: Progressing   Problem: Pain Managment: Goal: General experience of comfort will improve 05/27/2022 1843 by Laure Kidney, RN Outcome: Progressing 05/27/2022 1841 by Laure Kidney, RN Outcome: Progressing   Problem: Safety: Goal: Ability to remain free from injury will improve 05/27/2022 1843 by Laure Kidney, RN Outcome: Progressing 05/27/2022 1841 by Laure Kidney, RN Outcome: Progressing   Problem: Skin Integrity: Goal: Risk for impaired skin integrity will decrease 05/27/2022 1843 by Laure Kidney, RN Outcome: Progressing 05/27/2022 1841 by Laure Kidney, RN Outcome: Progressing

## 2022-05-27 NOTE — Evaluation (Signed)
Physical Therapy Evaluation Patient Details Name: Julie Bass MRN: [id number] DOB: 1926/10/11 Today's Date: 05/27/2022  History of Present Illness  Pt is a 88 yo female admitted for AMS changes and possible UTI. Pt with mildly elevated troponin but no treatment warrented per chart.  Pt has being treated for UTI and is on antibiotics currently.  PMH:  CKD III, dementia, GERD, HTN, NSVT, PAD.  Clinical Impression  Pt admitted with above diagnosis. PTA, pt lived with nephew who's available 24/7, but able to transfer with a RW for trips to the bathroom, meals, and would have an aide come 2x/week for showers. Upon PT eval, pt required max-total A +2 for 2 sit to stand transfers with c/o severe bilateral hip pain. Pt required max-total A +2 for bed mobility for physical assistance and safety, with pt unable to maintain sitting balance for >5 seconds. Pt unable to get to [facility] due to inability to achieve upright posture upon standing, increased anxiety, and knee extension/plantarflexion causing feet to slide anteriorly. Pt's daughter arrived at end of session and expressed desire for pt to return home, but also stated pt was just approved for hospice services. Discharge recommendations are currently SNF in order to increase functional independence for a safe return home. However, if pt's family chooses home, PT recommends a WC, hospital bed, and hoyer lift for safe mobility and transfers. Acutely, pt would benefit from skilled PT for functional mobility progression, improve strength, tolerance to activity, and balance for safe return home. Will continue to follow acutely.        Recommendations for follow up therapy are one component of a multi-disciplinary discharge planning process, led by the attending physician.  Recommendations may be updated based on patient status, additional functional criteria and insurance authorization.  Follow Up Recommendations Skilled nursing-short term rehab (<3  hours/day) Can patient physically be transported by private vehicle: No    Assistance Recommended at Discharge Frequent or constant Supervision/Assistance  Patient can return home with the following  Two people to help with walking and/or transfers;Two people to help with bathing/dressing/bathroom;Assistance with cooking/housework;Assistance with feeding;Direct supervision/assist for medications management;Direct supervision/assist for financial management;Assist for transportation;Help with stairs or ramp for entrance    Equipment Recommendations Wheelchair (measurements PT);Hospital bed;Other (comment);BSC/3in1 (dependent on next venue of care, if not SNF would recommend hoyer lift in addition to other equipment)  Recommendations for Other Services       Functional Status Assessment Patient has had a recent decline in their functional status and demonstrates the ability to make significant improvements in function in a reasonable and predictable amount of time.     Precautions / Restrictions Precautions Precautions: Fall Restrictions Weight Bearing Restrictions: No      Mobility  Bed Mobility Overal bed mobility: Needs Assistance Bed Mobility: Rolling, Sidelying to Sit, Sit to Supine Rolling: Max assist, +2 for physical assistance Sidelying to sit: Max assist, +2 for physical assistance   Sit to supine: Total assist, +2 for physical assistance   General bed mobility comments: pt max A +2 for physical assistance with trunk elevation and lowering, cues for reaching for bed rail while rolling, physical assist with LE manuevering in and out of bed, cues to scoot EOB with use of bed pad.    Transfers Overall transfer level: Needs assistance Equipment used: 2 person hand held assist   Sit to Stand: Max assist, +2 physical assistance, Total assist           General transfer comment: attempted standing  x2. upon first stand, max A +2 and pt required max multimodal cues for  upright posture and proper foot placement with inability to maintain standing. total A +2 for second sit to stand due to severe posterior lean and c/o severe bilateral hip pain.    Ambulation/Gait               General Gait Details: deferred ambulation  Stairs            Wheelchair Mobility    Modified Rankin (Stroke Patients Only)       Balance Overall balance assessment: Needs assistance Sitting-balance support: Feet supported, Single extremity supported Sitting balance-Leahy Scale: Poor Sitting balance - Comments: pt with inability to maintain upright balance for longer than ~5 seconds, needed mod A support due to posterior lean Postural control: Posterior lean Standing balance support: Bilateral upper extremity supported, Reliant on assistive device for balance Standing balance-Leahy Scale: Zero Standing balance comment: pt with inability to maintain standing balance due to c/o extreme hip pain, pt unable to maintain upright posture despite max multimodal cues or correct foot placement                             Pertinent Vitals/Pain Pain Assessment Pain Assessment: Faces Faces Pain Scale: Hurts even more Pain Location: at beginning of eval, pt stated not in any pain twice. upon standing, pt with extreme pain in bilateral hips. Pain Descriptors / Indicators: Grimacing, Moaning Pain Intervention(s): Limited activity within patient's tolerance, Monitored during session, Repositioned    Home Living Family/patient expects to be discharged to:: Private residence Living Arrangements: Other relatives (female cousin) Available Help at Discharge: Available 24 hours/day Type of Home: House Home Access: Stairs to enter Entrance Stairs-Rails: Right;Left;Can reach both Entrance Stairs-Number of Steps: 4   Home Layout: One level Home Equipment: Conservation officer, nature (2 wheels);BSC/3in1;Shower seat;Grab bars - toilet;Grab bars - tub/shower      Prior Function Prior  Level of Function : Needs assist  Cognitive Assist : Mobility (cognitive);ADLs (cognitive) Mobility (Cognitive): Intermittent cues   Physical Assist : Mobility (physical);ADLs (physical) Mobility (physical): Gait;Stairs ADLs (physical): Bathing;Toileting;IADLs Mobility Comments: Pt uses walker at all times but only gets up to walk to bathroom and get meals.  Otherwise sits in recliner most of the day. ADLs Comments: Pt could feed self, groom and usually toilet and dress self. Aide comes 2x week a to get pt in the shower.     Hand Dominance   Dominant Hand: Right    Extremity/Trunk Assessment   Upper Extremity Assessment Upper Extremity Assessment: Defer to OT evaluation    Lower Extremity Assessment Lower Extremity Assessment: Difficult to assess due to impaired cognition RLE Deficits / Details: foot remains in plantarflexion LLE Deficits / Details: foot remains in plantarflexion    Cervical / Trunk Assessment Cervical / Trunk Assessment: Kyphotic  Communication   Communication: Other (comment) (pt dysarthric on eval, difficult to understand)  Cognition Arousal/Alertness: Awake/alert Behavior During Therapy: Anxious Overall Cognitive Status: Impaired/Different from baseline Area of Impairment: Orientation, Attention, Memory, Following commands, Safety/judgement, Awareness, Problem solving                 Orientation Level: Disoriented to, Place, Time, Situation Current Attention Level: Focused Memory: Decreased short-term memory Following Commands: Follows one step commands inconsistently Safety/Judgement: Decreased awareness of safety, Decreased awareness of deficits Awareness: Intellectual Problem Solving: Slow processing, Requires verbal cues, Requires tactile cues, Decreased initiation, Difficulty sequencing  General Comments: pt with dementia at baseline. pt unable to follow simple commands consistently, daughter stated pt is constantly worried about falling. pt  requiring max multimodal cues for simple tasks.        General Comments      Exercises     Assessment/Plan    PT Assessment Patient needs continued PT services  PT Problem List Decreased strength;Decreased activity tolerance;Decreased mobility;Decreased cognition;Decreased safety awareness;Pain;Decreased balance       PT Treatment Interventions Gait training;Functional mobility training;Balance training;Patient/family education;Neuromuscular re-education;Therapeutic activities;Therapeutic exercise;Stair training    PT Goals (Current goals can be found in the Care Plan section)  Acute Rehab PT Goals PT Goal Formulation: Patient unable to participate in goal setting Time For Goal Achievement: 06/10/22 Potential to Achieve Goals: Fair    Frequency Min 4X/week     Co-evaluation               AM-PAC PT "6 Clicks" Mobility  Outcome Measure Help needed turning from your back to your side while in a flat bed without using bedrails?: A Lot Help needed moving from lying on your back to sitting on the side of a flat bed without using bedrails?: A Lot Help needed moving to and from a bed to a chair (including a wheelchair)?: Total Help needed standing up from a chair using your arms (e.g., wheelchair or bedside chair)?: Total Help needed to walk in hospital room?: Total Help needed climbing 3-5 steps with a railing? : Total 6 Click Score: 8    End of Session Equipment Utilized During Treatment: Gait belt Activity Tolerance: Patient limited by pain Patient left: in bed;with call bell/phone within reach;with bed alarm set;with family/visitor present Nurse Communication: Mobility status PT Visit Diagnosis: Unsteadiness on feet (R26.81);Other abnormalities of gait and mobility (R26.89);Pain Pain - part of body: Hip (bilateral)    Time: 4081-4481 PT Time Calculation (min) (ACUTE ONLY): 23 min   Charges:   PT Evaluation $PT Eval Moderate Complexity: 1 Mod PT  Treatments $Therapeutic Activity: 8-22 mins        Havery Moros, MS, SPT Acute Rehabilitation Services Office: [place] 05/27/2022, 1:45 PM

## 2022-05-27 NOTE — Consult Note (Addendum)
Cardiology Consultation:   Patient ID: Julie Bass MRN: [id number]; DOB: Nov 20, 1925  Admit date: 05/24/2022 Date of Consult: 05/27/2022  PCP:  Denita Lung, MD   [facility] Providers Cardiologist:  Shelva Majestic, MD     Patient Profile:   Julie Bass is a 88 y.o. female with a hx of CKD stage III, dementia, GERD, hypertension, NSVT, PAD, glaucoma who is being seen 05/27/2022 for the evaluation of weakness at the request of Dr Louanne Belton.  History of Present Illness:   Julie Bass is a 88 year old female with above medical history who is followed by Dr. Claiborne Billings.  Per chart review, patient was hospitalized in 08/2014 after having a syncopal episode.  Patient was not significantly orthostatic on her presentation.  During that admission, telemetry showed a single 15 beat run of nonsustained VT.  She ultimately underwent implantation of a loop recorder on 08/27/2014.  She also had carotid duplex imaging and findings suggested 1-39% internal carotid artery stenosis bilaterally. Shortly after that admission, patient underwent neurological evaluation and was diagnosed with moderate dementia that was felt to be Alzheimer's versus vascular dementia.  Patient had a 2-year follow-up echocardiogram on 10/13/2016 that showed EF 55-60%, no regional wall motion abnormalities, grade 2 diastolic dysfunction.    In 2019, patient was noted to have developed intermittent bradycardia, heart block.  Had a Biotronik pacemaker implanted in 12/2017. Echocardiogram on 01/10/2018 showed EF 97-35%, grade 1 diastolic dysfunction, moderate-severely calcified mitral valve.  Patient had poor follow-up due to the COVID-19 pandemic.  Patient was last seen by cardiology on 09/11/2020.  At that appointment, a device interrogation showed that she had 27 high ventricular rate episodes with SVT and nonsustained SVT.  She was treated with metoprolol XL.  Patient presented to the ED on 7/10 accompanied by her family.   Patient was a poor historian due to her underlying dementia, acute encephalopathy.  Per family, patient was unable to ambulate and communicate compared to her baseline.  She was admitted to the triad hospitalist service for evaluation. Currently being treated for metabolic encephalopathy that is thought to be secondary to underlying infection and hypertensive urgency, possible UTI, deconditioning, debility. Palliative care has been consulted for goals of  care discussion  Echocardiogram on 05/25/22 showed EF >75%, mild LVH, grade I diastolic dysfunction, normal RV systolic function, moderate-severe mitral valve regurgitation. hsTn 129>>148>>134   Past Medical History:  Diagnosis Date   Arthritis    Chronic kidney disease    KIDNEY STONES   Dementia ([place])    BEGINNING STAGES    Depression    GERD (gastroesophageal reflux disease)    Glaucoma    Hiatal hernia    HTN (hypertension) 10/18/2011   Hyperlipemia 10/18/2011   NSVT (nonsustained ventricular tachycardia) ([place]) 10/18/2011   Osteoporosis    PAD (peripheral artery disease) ([place]) 10/18/2011   h/o left SFA stent    Past Surgical History:  Procedure Laterality Date   ABDOMINAL HYSTERECTOMY  1973   CARDIAC CATHETERIZATION     2012   CHOLECYSTECTOMY     LOOP RECORDER IMPLANT N/A 08/27/2014   Procedure: LOOP RECORDER IMPLANT;  Surgeon: Coralyn Mark, MD;  Location: [facility];  Service: Cardiovascular;  Laterality: N/A;   LOOP RECORDER REMOVAL N/A 01/09/2018   Procedure: LOOP RECORDER REMOVAL;  Surgeon: Evans Lance, MD;  Location: [facility];  Service: Cardiovascular;  Laterality: N/A;   LOWER EXTREMITY ANGIOGRAM  11/02/2007   stent of mid left SFA with 6x134m  EV3 self-expanding stent and 6x3 in prox region (Dr. Adora Fridge)   [place] ARTERIAL DOPPLER  2013   right SFA with 50-69% diameter reduction, right PTA/peroneal occluded, L SFA prox to stent has narrowing with increased velocities  >60% diameter reduction, L SFA stent patent, L ATA w/occlusive disease, bilat ABIs show mild arterial insuffiency at rest   NM MYOCAR PERF WALL MOTION  10/2011   non-gated - normal stuy, EF 82%, normal LV wall motion   PACEMAKER IMPLANT N/A 01/09/2018   Procedure: PACEMAKER IMPLANT;  Surgeon: Evans Lance, MD;  Location: [facility];  Service: Cardiovascular;  Laterality: N/A;   REVERSE SHOULDER ARTHROPLASTY Right 01/26/2013   Procedure: RIGHT REVERSE TOTAL SHOULDER ARTHROPLASTY;  Surgeon: Augustin Schooling, MD;  Location: [facility];  Service: Orthopedics;  Laterality: Right;   TRANSTHORACIC ECHOCARDIOGRAM  10/2012   EF 75-70%, mod conc hypertrophy, severely calcified MV annulus, LA mildly dailted, PA peak pressure 40mHg     Home Medications:  Prior to Admission medications   Medication Sig Start Date End Date Taking? Authorizing Provider  amLODipine (NORVASC) 2.5 MG tablet TAKE 1 TABLET (2.5 MG TOTAL) BY MOUTH EVERY EVENING. KEEP APPOINTMENT FOR FURTHER REFILLS. Patient taking differently: Take 2.5 mg by mouth daily with supper. Keep appointment for further refills. 05/12/22  Yes KTroy Sine MD  Biotin 10000 MCG TABS Take 10,000 mcg by mouth daily with breakfast.   Yes [provider]  cilostazol (PLETAL) 50 MG tablet TAKE 1 TABLET BY MOUTH TWICE A DAY Patient taking differently: Take 50 mg by mouth 2 (two) times daily with a meal. 02/26/22  Yes Croitoru, Mihai, MD  citalopram (CELEXA) 20 MG tablet TAKE 1 TABLET BY MOUTH EVERY DAY Patient taking differently: Take 20 mg by mouth daily with supper. 09/17/21  Yes LDenita Lung MD  CRANBERRY PO Take 1 capsule by mouth 2 (two) times daily with a meal.   Yes [provider]  ezetimibe (ZETIA) 10 MG tablet TAKE 1 TABLET BY MOUTH EVERY DAY Patient taking differently: Take 10 mg by mouth daily with breakfast. 09/17/21  Yes KTroy Sine MD  feeding supplement, ENSURE COMPLETE, (ENSURE COMPLETE) LIQD Take 237 mLs by mouth 3  (three) times daily between meals. 08/27/14  Yes Barrett, REvelene Croon PA-C  ibuprofen (ADVIL) 200 MG tablet Take 400 mg by mouth 2 (two) times daily as needed (pain).   Yes [provider]  latanoprost (XALATAN) 0.005 % ophthalmic solution Place 1 drop into both eyes 2 (two) times daily. 04/28/22  Yes [provider]  LIVALO 2 MG TABS TAKE 1 TABLET BY MOUTH EVERY DAY Patient taking differently: Take 2 mg by mouth daily with breakfast. 11/18/21  Yes KTroy Sine MD  metoprolol succinate (TOPROL-XL) 100 MG 24 hr tablet TAKE 1 TABLET BY MOUTH EVERY EVENING. Patient taking differently: Take 100 mg by mouth daily with supper. 05/12/22  Yes KTroy Sine MD  omega-3 acid ethyl esters (LOVAZA) 1 g capsule TAKE 1 CAPSULE (1 G TOTAL) BY MOUTH EVERY EVENING. Patient taking differently: Take 1 g by mouth daily with supper. 11/18/21  Yes KTroy Sine MD  pantoprazole (PROTONIX) 40 MG tablet TAKE 1 TABLET (40 MG TOTAL) BY MOUTH DAILY AS NEEDED (FOR ACID REFLUX). Patient taking differently: Take 40 mg by mouth daily with breakfast. 05/12/22  Yes KTroy Sine MD  sulfamethoxazole-trimethoprim (BACTRIM DS) 800-160 MG tablet Take 1 tablet by mouth  2 (two) times daily. Patient taking differently: Take 1 tablet by mouth See admin instructions. Take one tablet by mouth twice daily for 10 days as needed for UTI 04/21/22  Yes Croitoru, Mihai, MD  mirtazapine (REMERON SOL-TAB) 15 MG disintegrating tablet TAKE 1 TABLET BY MOUTH EVERYDAY AT BEDTIME Patient not taking: Reported on 05/25/2022 01/28/22   Denita Lung, MD  timolol (TIMOPTIC) 0.5 % ophthalmic solution Place 1 drop into both eyes 2 (two) times daily.  Patient not taking: Reported on 05/25/2022    [provider]    Inpatient Medications: Scheduled Meds:  amLODipine  5 mg Oral Daily   cilostazol  50 mg Oral BID   citalopram  20 mg Oral Daily   ezetimibe  10 mg Oral Daily   feeding supplement  237 mL Oral TID BM   heparin   5,000 Units Subcutaneous Q8H   latanoprost  1 drop Both Eyes BID   metoprolol tartrate  25 mg Oral BID   omega-3 acid ethyl esters  1 g Oral QPM   sodium chloride flush  3 mL Intravenous Q12H   Continuous Infusions:  cefTRIAXone (ROCEPHIN)  IV 1 g (05/27/22 0901)   PRN Meds: acetaminophen **OR** acetaminophen, albuterol, hydrALAZINE, ondansetron **OR** ondansetron (ZOFRAN) IV, pantoprazole  Allergies:    Allergies  Allergen Reactions   Crestor [Rosuvastatin Calcium] Other (See Comments)    Muscle pain, myalgia   Lipitor [Atorvastatin Calcium] Other (See Comments)    Muscle pain, myalgia    Social History:   Social History   Socioeconomic History   Marital status: Widowed    Spouse name: Not on file   Number of children: 4   Years of education: Not on file   Highest education level: Not on file  Occupational History   Not on file  Tobacco Use   Smoking status: Former    Types: Cigarettes    Quit date: 05/07/1984    Years since quitting: 38.0   Smokeless tobacco: Never  Substance and Sexual Activity   Alcohol use: No   Drug use: No   Sexual activity: Not Currently  Other Topics Concern   Not on file  Social History Narrative   Not on file   Social Determinants of Health   Financial Resource Strain: Not on file  Food Insecurity: Not on file  Transportation Needs: Not on file  Physical Activity: Not on file  Stress: Not on file  Social Connections: Not on file  Intimate Partner Violence: Not on file    Family History:    Family History  Problem Relation Age of Onset   Throat cancer Father    Cancer Mother    CAD Son    Breast cancer Sister        cervical cancer   Colon cancer Brother        throat cancer   Pulmonary embolism Daughter    Hypertension Daughter      ROS:  Please see the history of present illness.   All other ROS reviewed and negative.     Physical Exam/Data:   Vitals:   05/26/22 2345 05/27/22 0438 05/27/22 0806 05/27/22 1040   BP: 138/63 (!) 143/74 (!) 170/75 (!) 186/78  Pulse: 62 63 74   Resp: 16 18    Temp: 97.9 F (36.6 C) 98.5 F (36.9 C)    TempSrc: Oral     SpO2: 96% 98% 100%     Intake/Output Summary (Last 24 hours) at 05/27/2022 1108 Last  data filed at 05/26/2022 1754 Gross per 24 hour  Intake 200 ml  Output --  Net 200 ml      05/03/2022    2:52 PM 03/23/2021    9:23 PM 02/20/2021    6:47 PM  Last 3 Weights  Weight (lbs) 102 lb 3.2 oz 107 lb 5.8 oz 107 lb 4.8 oz  Weight (kg) 46.358 kg 48.7 kg 48.671 kg     There is no height or weight on file to calculate BMI.  General:  Frail, elderly female in no acute distress. Sitting comfortably in the bed  HEENT: normal Neck: no JVD Vascular: Radial pulses 2+ bilaterally Cardiac:  normal S1, S2; RRR, faint diastolic murmur at apex  Lungs:  clear to auscultation bilaterally, no wheezing, rhonchi or rales  Abd: soft, nontender, no hepatomegaly  Ext: no edema Musculoskeletal:  No deformities, BUE and BLE strength normal and equal Skin: warm and dry  Neuro:  CNs 2-12 intact. Oriented to person, not place or time  Psych:  Normal affect   EKG:  The EKG was personally reviewed and demonstrates:  Sinus rhythm, LVH Telemetry:  Telemetry was personally reviewed and demonstrates:  Predominantly sinus rhythm, occasional episodes of AV pacing   Relevant CV Studies:   Echocardiogram 05/25/22  1. Left ventricular ejection fraction, by estimation, is >75%. The left  ventricle has hyperdynamic function. The left ventricle has no regional  wall motion abnormalities. There is mild concentric left ventricular  hypertrophy. Left ventricular diastolic  parameters are consistent with Grade I diastolic dysfunction (impaired  relaxation). Elevated left atrial pressure.   2. Right ventricular systolic function is normal. The right ventricular  size is normal. Tricuspid regurgitation signal is inadequate for assessing  PA pressure.   3. The mitral valve is  degenerative. No evidence of mitral valve  regurgitation. Mild mitral stenosis. The mean mitral valve gradient is 4.0  mmHg with average heart rate of 80 bpm. Moderate to severe mitral annular  calcification.   4. The aortic valve is tricuspid. There is moderate calcification of the  aortic valve. There is moderate thickening of the aortic valve. Aortic  valve regurgitation is not visualized.   Laboratory Data:  High Sensitivity Troponin:   Recent Labs  Lab 05/24/22 2108 05/24/22 2334 05/25/22 0706 05/25/22 0940  TROPONINIHS 129* 133* 148* 134*     Chemistry Recent Labs  Lab 05/25/22 0706 05/25/22 0725 05/26/22 0651 05/27/22 0214  NA 140 139 140 141  K 3.5 3.5 3.3* 4.2  CL 104  --  107 110  CO2 23  --  24 22  GLUCOSE 103*  --  107* 114*  BUN 13  --  12 22  CREATININE 1.21*  --  1.15* 1.37*  CALCIUM 9.7  --  9.7 10.1  MG  --   --   --  2.1  GFRNONAA 42*  --  44* 36*  ANIONGAP 13  --  9 9    Recent Labs  Lab 05/24/22 2108 05/25/22 0706 05/26/22 0651  PROT 6.9 7.0 6.9  ALBUMIN 3.7 3.5 3.4*  AST 39 36 41  ALT '21 24 27  '$ ALKPHOS 45 46 46  BILITOT 0.9 1.2 1.3*   Lipids No results for input(s): "CHOL", "TRIG", "HDL", "LABVLDL", "LDLCALC", "CHOLHDL" in the last 168 hours.  Hematology Recent Labs  Lab 05/25/22 0706 05/25/22 0725 05/26/22 0651 05/27/22 0214  WBC 6.5  --  6.7 6.1  RBC 4.20  --  4.33 4.28  HGB 13.5  13.6 13.9 13.5  HCT 40.4 40.0 41.9 41.3  MCV 96.2  --  96.8 96.5  MCH 32.1  --  32.1 31.5  MCHC 33.4  --  33.2 32.7  RDW 13.7  --  13.5 13.8  PLT 168  --  196 206   Thyroid  Recent Labs  Lab 05/25/22 0706  TSH 2.388    BNP Recent Labs  Lab 05/25/22 0706  BNP 168.1*    DDimer No results for input(s): "DDIMER" in the last 168 hours.   Radiology/Studies:  ECHOCARDIOGRAM COMPLETE  Result Date: 05/25/2022    ECHOCARDIOGRAM REPORT   Patient Name:   Julie Bass Date of Exam: 05/25/2022 Medical Rec #:  [id number]        Height:       64.0  in Accession #:    [id number]       Weight:       102.2 lb Date of Birth:  [birth year]        BSA:          1.471 m Patient Age:    8 years         BP:           195/90 mmHg Patient Gender: F                HR:           79 bpm. Exam Location:  Inpatient Procedure: 2D Echo, Color Doppler and Cardiac Doppler Indications:    "Other Abnormalities of the Heart" R00.8  History:        Patient has prior history of Echocardiogram examinations, most                 recent 01/10/2018. Pacemaker; Risk Factors:Hypertension,                 Dyslipidemia and Sleep Apnea.  Sonographer:    Raquel Sarna Senior RDCS Referring Phys: [id number] SARA-MAIZ A THOMAS  Sonographer Comments: Some images limited by movement, patient pleasantly confused and playing with probe IMPRESSIONS  1. Left ventricular ejection fraction, by estimation, is >75%. The left ventricle has hyperdynamic function. The left ventricle has no regional wall motion abnormalities. There is mild concentric left ventricular hypertrophy. Left ventricular diastolic parameters are consistent with Grade I diastolic dysfunction (impaired relaxation). Elevated left atrial pressure.  2. Right ventricular systolic function is normal. The right ventricular size is normal. Tricuspid regurgitation signal is inadequate for assessing PA pressure.  3. The mitral valve is degenerative. No evidence of mitral valve regurgitation. Mild mitral stenosis. The mean mitral valve gradient is 4.0 mmHg with average heart rate of 80 bpm. Moderate to severe mitral annular calcification.  4. The aortic valve is tricuspid. There is moderate calcification of the aortic valve. There is moderate thickening of the aortic valve. Aortic valve regurgitation is not visualized. FINDINGS  Left Ventricle: Left ventricular ejection fraction, by estimation, is >75%. The left ventricle has hyperdynamic function. The left ventricle has no regional wall motion abnormalities. The left ventricular internal cavity size was  normal in size. There is mild concentric left ventricular hypertrophy. Left ventricular diastolic parameters are consistent with Grade I diastolic dysfunction (impaired relaxation). Elevated left atrial pressure. Right Ventricle: The right ventricular size is normal. No increase in right ventricular wall thickness. Right ventricular systolic function is normal. Tricuspid regurgitation signal is inadequate for assessing PA pressure. Left Atrium: Left atrial size was normal in size. Right Atrium: Right atrial size was normal in  size. Pericardium: There is no evidence of pericardial effusion. Mitral Valve: The mitral valve is degenerative in appearance. Moderate to severe mitral annular calcification. No evidence of mitral valve regurgitation. Mild mitral valve stenosis. MV peak gradient, 8.0 mmHg. The mean mitral valve gradient is 4.0 mmHg with average heart rate of 80 bpm. Tricuspid Valve: The tricuspid valve is normal in structure. Tricuspid valve regurgitation is not demonstrated. Aortic Valve: The aortic valve is tricuspid. There is moderate calcification of the aortic valve. There is moderate thickening of the aortic valve. Aortic valve regurgitation is not visualized. Pulmonic Valve: The pulmonic valve was not well visualized. Pulmonic valve regurgitation is not visualized. Aorta: The aortic root and ascending aorta are structurally normal, with no evidence of dilitation. IAS/Shunts: The interatrial septum was not well visualized.  LEFT VENTRICLE PLAX 2D LVIDd:         3.40 cm   Diastology LVIDs:         2.00 cm   LV e' medial:    2.94 cm/s LV PW:         1.20 cm   LV E/e' medial:  27.2 LV IVS:        1.20 cm   LV e' lateral:   5.66 cm/s LVOT diam:     1.80 cm   LV E/e' lateral: 14.2 LV SV:         45 LV SV Index:   30 LVOT Area:     2.54 cm  RIGHT VENTRICLE RV S prime:     9.14 cm/s TAPSE (M-mode): 1.8 cm LEFT ATRIUM             Index LA diam:        2.90 cm 1.97 cm/m LA Vol (A2C):   49.5 ml 33.66 ml/m LA  Vol (A4C):   64.0 ml 43.52 ml/m LA Biplane Vol: 58.4 ml 39.71 ml/m  AORTIC VALVE LVOT Vmax:   79.60 cm/s LVOT Vmean:  58.400 cm/s LVOT VTI:    0.175 m  AORTA Ao Root diam: 2.70 cm MITRAL VALVE MV Area (PHT): 2.85 cm     SHUNTS MV Area VTI:   1.59 cm     Systemic VTI:  0.18 m MV Peak grad:  8.0 mmHg     Systemic Diam: 1.80 cm MV Mean grad:  4.0 mmHg MV Vmax:       1.41 m/s MV Vmean:      97.2 cm/s MV Decel Time: 266 msec MV E velocity: 80.10 cm/s MV A velocity: 120.00 cm/s MV E/A ratio:  0.67 Mihai Croitoru MD Electronically signed by Sanda Klein MD Signature Date/Time: 05/25/2022/4:01:17 PM    Final    CT ABDOMEN PELVIS WO CONTRAST  Result Date: 05/24/2022 CLINICAL DATA:  Abdominal pain EXAM: CT ABDOMEN AND PELVIS WITHOUT CONTRAST TECHNIQUE: Multidetector CT imaging of the abdomen and pelvis was performed following the standard protocol without IV contrast. RADIATION DOSE REDUCTION: This exam was performed according to the departmental dose-optimization program which includes automated exposure control, adjustment of the mA and/or kV according to patient size and/or use of iterative reconstruction technique. COMPARISON:  05/30/2020 FINDINGS: Lower chest: Dependent opacities in the lower lobes, likely dependent atelectasis. Hepatobiliary: No focal hepatic abnormality.  Prior cholecystectomy. Pancreas: No focal abnormality or ductal dilatation. Spleen: No focal abnormality.  Normal size. Adrenals/Urinary Tract: Left interpolar calyceal diverticulum again noted containing layering stones, unchanged since prior study. Renal vascular calcifications in the renal hila bilaterally. No hydronephrosis. Adrenal glands and urinary bladder unremarkable.  Stomach/Bowel: Extensive sigmoid diverticulosis. No active diverticulitis. Diffuse gaseous distention of bowel without evidence of obstruction. This may reflect mild ileus. Vascular/Lymphatic: Diffuse aortic and iliac calcifications. No evidence of aneurysm or  adenopathy. Reproductive: Prior hysterectomy.  No adnexal masses. Other: No free fluid or free air. Musculoskeletal: No acute bony abnormality. 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 No ureteral stones or hydronephrosis. Dependent atelectasis in the lower lobes. Diffuse gaseous distention of bowel may reflect ileus. Aortoiliac atherosclerosis. Electronically Signed   By: Rolm Baptise M.D.   On: 05/24/2022 23:18   CT Head Wo Contrast  Result Date: 05/24/2022 CLINICAL DATA:  Memory loss Mental status change, unknown cause EXAM: CT HEAD WITHOUT CONTRAST TECHNIQUE: Contiguous axial images were obtained from the base of the skull through the vertex without intravenous contrast. RADIATION DOSE REDUCTION: This exam was performed according to the departmental dose-optimization program which includes automated exposure control, adjustment of the mA and/or kV according to patient size and/or use of iterative reconstruction technique. COMPARISON:  03/23/2021 FINDINGS: Brain: There is atrophy and chronic small vessel disease changes. No acute intracranial abnormality. Specifically, no hemorrhage, hydrocephalus, mass lesion, acute infarction, or significant intracranial injury. Vascular: No hyperdense vessel or unexpected calcification. Extensive vertebral and carotid vascular calcifications. Skull: No acute calvarial abnormality. Sinuses/Orbits: No acute findings Other: None IMPRESSION: Atrophy, chronic microvascular disease. No acute intracranial abnormality. Electronically Signed   By: Rolm Baptise M.D.   On: 05/24/2022 23:12   DG Chest Port 1 View  Result Date: 05/24/2022 CLINICAL DATA:  Questionable sepsis - evaluate for abnormality EXAM: PORTABLE CHEST 1 VIEW COMPARISON:  05/29/2020 FINDINGS: Left pacer remains in place, unchanged. Heart and mediastinal contours are within normal limits. No focal opacities or effusions. No acute bony abnormality. Prior right shoulder replacement,  unchanged. IMPRESSION: No active cardiopulmonary disease. Electronically Signed   By: Rolm Baptise M.D.   On: 05/24/2022 21:02     Assessment and Plan:   Elevated Troponin  - hsTn 129>>148 this admission  - Altered mental status, but denies any chest pain or sob - Patient is very frail, is 88 years old, has dementia-- she is not a candidate for aggressive intervention - No plans for ischemic evaluation at this time.  - Suspect troponin elevation is secondary to demand in the setting of hypertensive urgency (BP 176/70 on admission, has gotten up to 186/78)  - Continue metoprolol 25 mg BID, zetia 10 mg daily (intolerant to multiple statins)  - Agree with palliative care consultation  Intermittent AV block s/p PPM  PVCs, NSVT  - Device last interrogated on 5/16- showed normal device function, no clinically significant episodes of high ventricular rate - Per telemetry, patient is predominantly in sinus rhythm with occasional pacing  - Low PVC burden on telemetry  - Continue metoprolol as above   HTN Urgency  - BP 176/70 on admission.  Has ranged from 118/89 to 186/78 in the past 24 hours  - Somewhat labile BP - Managed per primary  Otherwise per primary  - Dementia - Deconditioning, debility - Metabolic Encephalopathy  - Possible UTI    Risk Assessment/Risk Scores:   For questions or updates, please contact [facility] Please consult [email or web address] for contact info under   Signed, Margie Billet, PA-C  05/27/2022 11:08 AM  Patient seen and examined, note reviewed with the signed Advanced Practice Provider. I personally reviewed laboratory data, imaging studies and relevant notes. I independently examined the patient and formulated the important aspects of  the plan. I have personally discussed the plan with the patient and/or family. Comments or changes to the note/plan are indicated below.  No invasive cardiovascular intervention recommended at this time. Patient has  been approved for hospice.  Discussed with her daughter.  Berniece Salines DO, MS [facility] Attending Cardiologist [place]  [address] [place], [place] [address] [phone] Website: [email or web address]

## 2022-05-28 DIAGNOSIS — I160 Hypertensive urgency: Secondary | ICD-10-CM | POA: Diagnosis not present

## 2022-05-28 DIAGNOSIS — F0390 Unspecified dementia without behavioral disturbance: Secondary | ICD-10-CM | POA: Diagnosis not present

## 2022-05-28 DIAGNOSIS — G9340 Encephalopathy, unspecified: Secondary | ICD-10-CM | POA: Diagnosis not present

## 2022-05-28 DIAGNOSIS — E7800 Pure hypercholesterolemia, unspecified: Secondary | ICD-10-CM | POA: Diagnosis not present

## 2022-05-28 LAB — CBC
HCT: 40 % (ref 36.0–46.0)
Hemoglobin: 13.3 g/dL (ref 12.0–15.0)
MCH: 32.3 pg (ref 26.0–34.0)
MCHC: 33.3 g/dL (ref 30.0–36.0)
MCV: 97.1 fL (ref 80.0–100.0)
Platelets: 214 10*3/uL (ref 150–400)
RBC: 4.12 MIL/uL (ref 3.87–5.11)
RDW: 13.8 % (ref 11.5–15.5)
WBC: 5.4 10*3/uL (ref 4.0–10.5)
nRBC: 0 % (ref 0.0–0.2)

## 2022-05-28 LAB — BASIC METABOLIC PANEL
Anion gap: 10 (ref 5–15)
BUN: 19 mg/dL (ref 8–23)
CO2: 23 mmol/L (ref 22–32)
Calcium: 9.8 mg/dL (ref 8.9–10.3)
Chloride: 109 mmol/L (ref 98–111)
Creatinine, Ser: 1.19 mg/dL — ABNORMAL HIGH (ref 0.44–1.00)
GFR, Estimated: 42 mL/min — ABNORMAL LOW (ref >60)
Glucose, Bld: 94 mg/dL (ref 70–99)
Potassium: 3.8 mmol/L (ref 3.5–5.1)
Sodium: 142 mmol/L (ref 135–145)

## 2022-05-28 LAB — MAGNESIUM: Magnesium: 1.9 mg/dL (ref 1.7–2.4)

## 2022-05-28 MED ORDER — HYDRALAZINE HCL 25 MG PO TABS: 25.0000 mg | ORAL_TABLET | Freq: Three times a day (TID) | ORAL | 0 refills | Status: DC

## 2022-05-28 NOTE — Discharge Summary (Signed)
Physician Discharge Summary   Patient: Julie Bass MRN: [id number] DOB: March 10, 1926  Admit date:     05/24/2022  Discharge date: 05/28/22  Discharge Physician: Flora Lipps   PCP: Denita Lung, MD   Recommendations at discharge:   Follow-up with hospice care provider after discharge  Discharge Diagnoses: Principal Problem:   Encephalopathy acute Active Problems:   Hypertensive urgency   HLD (hyperlipidemia)   CKD (chronic kidney disease), stage III   UTI (urinary tract infection)   Dementia without behavioral disturbance (HCC)  Resolved Problems:   Hypokalemia  Hospital Course: 88 year old female with medical history of CKD stage III, dementia, GERD, hypertension, NSVT, PAD presented to hospital with altered mental status and weakness from her baseline.  Patient was on Bactrim as outpatient for UTI but had become more lethargic and was unable to do ADLs.  At baseline,patient was able to do things by herself including walking with a help of a walker.  In the ED, patient was noted to have elevated blood pressure.  EKG showed normal sinus rhythm.  Patient was then admitted hospital for further evaluation and treatment .  Following conditions were addressed during hospitalization,  Metabolic encephalopathy On the background of dementia.  Secondary to underlying infection/hypertensive urgency.  Currently on dysphagia 3 diet.  Respiratory viral panel was negative.   Hypertensive urgency.  On multiple medications. Currently on multiple medication including amlodipine, metoprolol. Added hydralazine 3 times daily for now.     Possible UTI. Partially treated with Bactrim as outpatient.  UA is clear.  Urine culture no growth so far.  Received Rocephin IV.    Abnormal troponin Elevated troponins with no mention of chest pain.  EKG was unremarkable.  Patient is elderly frail with dementia and is not a good candidate for aggressive intervention.  Palliative care has been consulted  for goals of care discussion.  Patient's family however wished to have a cardiology consultation.  Has been seen by cardiology and no plans for further intervention.    Dementia History of progressive decline as noted by the family.  Continue supportive care.  Patient will be transition to hospice care   Deconditioning, debility.  Patient used to ambulate with a walker in the past.  Has progressively declined.  Plan for hospice care on discharge   Hypokalemia.  Improved after replacement.  Latest potassium of 4.2.   CKD stage IIIb.     Goals of care.  Palliative care was consulted and at this time patient is home hospice on discharge   consultants:  Palliative care Cardiology  Procedures performed: None Disposition: Hospice care Diet recommendation:  Discharge Diet Orders (From admission, onward)     Start     Ordered   05/28/22 0000  Diet general       Comments: Dysphagia III diet   05/28/22 0928           Regular diet DISCHARGE MEDICATION: Allergies as of 05/28/2022       Reactions   Crestor [rosuvastatin Calcium] Other (See Comments)   Muscle pain, myalgia   Lipitor [atorvastatin Calcium] Other (See Comments)   Muscle pain, myalgia        Medication List     STOP taking these medications    ibuprofen 200 MG tablet Commonly known as: ADVIL   Livalo 2 MG Tabs Generic drug: Pitavastatin Calcium   sulfamethoxazole-trimethoprim 800-160 MG tablet Commonly known as: BACTRIM DS       TAKE these medications    amLODipine  2.5 MG tablet Commonly known as: NORVASC TAKE 1 TABLET (2.5 MG TOTAL) BY MOUTH EVERY EVENING. KEEP APPOINTMENT FOR FURTHER REFILLS. What changed: when to take this   Biotin 10000 MCG Tabs Take 10,000 mcg by mouth daily with breakfast.   cilostazol 50 MG tablet Commonly known as: PLETAL TAKE 1 TABLET BY MOUTH TWICE A DAY What changed: when to take this   citalopram 20 MG tablet Commonly known as: CELEXA TAKE 1 TABLET BY MOUTH  EVERY DAY What changed: when to take this   CRANBERRY PO Take 1 capsule by mouth 2 (two) times daily with a meal.   ezetimibe 10 MG tablet Commonly known as: ZETIA TAKE 1 TABLET BY MOUTH EVERY DAY What changed: when to take this   feeding supplement (ENSURE COMPLETE) Liqd Take 237 mLs by mouth 3 (three) times daily between meals.   hydrALAZINE 25 MG tablet Commonly known as: APRESOLINE Take 1 tablet (25 mg total) by mouth 3 (three) times daily.   latanoprost 0.005 % ophthalmic solution Commonly known as: XALATAN Place 1 drop into both eyes 2 (two) times daily.   metoprolol succinate 100 MG 24 hr tablet Commonly known as: TOPROL-XL TAKE 1 TABLET BY MOUTH EVERY EVENING. What changed: when to take this   mirtazapine 15 MG disintegrating tablet Commonly known as: REMERON SOL-TAB TAKE 1 TABLET BY MOUTH EVERYDAY AT BEDTIME   omega-3 acid ethyl esters 1 g capsule Commonly known as: LOVAZA TAKE 1 CAPSULE (1 G TOTAL) BY MOUTH EVERY EVENING. What changed: when to take this   pantoprazole 40 MG tablet Commonly known as: PROTONIX TAKE 1 TABLET (40 MG TOTAL) BY MOUTH DAILY AS NEEDED (FOR ACID REFLUX). What changed: when to take this   timolol 0.5 % ophthalmic solution Commonly known as: TIMOPTIC Place 1 drop into both eyes 2 (two) times daily.       Subjective: Today, patient was seen and examined at bedside.  Appears somnolent.  Poor historian.  Discharge Exam:    05/28/2022    7:58 AM 05/28/2022    4:16 AM 05/27/2022   11:52 PM  Vitals with BMI  Systolic [phone]  Diastolic 91 87 97  Pulse 84 77 83    General: Frail-appearing, elderly and deconditioned, somnolent HENT:   No scleral pallor or icterus noted. Oral mucosa is moist.  Chest:  Clear breath sounds.  Diminished breath sounds bilaterally. No crackles or wheezes.  CVS: S1 &S2 heard. No murmur.  Regular rate and rhythm. Abdomen: Soft, nontender, nondistended.  Bowel sounds are heard.   Extremities: No  cyanosis, clubbing or edema.  Peripheral pulses are palpable. Psych: Frail and weak and deconditioned, somnolent CNS: Mildly somnolent, moves extremities Skin: Warm and dry.  No rashes noted.   Condition at discharge: stable  The results of significant diagnostics from this hospitalization (including imaging, microbiology, ancillary and laboratory) are listed below for reference.   Imaging Studies: ECHOCARDIOGRAM COMPLETE  Result Date: 05/25/2022    ECHOCARDIOGRAM REPORT   Patient Name:   Julie Bass Date of Exam: 05/25/2022 Medical Rec #:  [id number]        Height:       64.0 in Accession #:    [id number]       Weight:       102.2 lb Date of Birth:  11-10-1926        BSA:          1.471 m Patient Age:    88 years  BP:           195/90 mmHg Patient Gender: F                HR:           79 bpm. Exam Location:  Inpatient Procedure: 2D Echo, Color Doppler and Cardiac Doppler Indications:    "Other Abnormalities of the Heart" R00.8  History:        Patient has prior history of Echocardiogram examinations, most                 recent 01/10/2018. Pacemaker; Risk Factors:Hypertension,                 Dyslipidemia and Sleep Apnea.  Sonographer:    Raquel Sarna Senior RDCS Referring Phys: [id number] SARA-MAIZ A THOMAS  Sonographer Comments: Some images limited by movement, patient pleasantly confused and playing with probe IMPRESSIONS  1. Left ventricular ejection fraction, by estimation, is >75%. The left ventricle has hyperdynamic function. The left ventricle has no regional wall motion abnormalities. There is mild concentric left ventricular hypertrophy. Left ventricular diastolic parameters are consistent with Grade I diastolic dysfunction (impaired relaxation). Elevated left atrial pressure.  2. Right ventricular systolic function is normal. The right ventricular size is normal. Tricuspid regurgitation signal is inadequate for assessing PA pressure.  3. The mitral valve is degenerative. No evidence of  mitral valve regurgitation. Mild mitral stenosis. The mean mitral valve gradient is 4.0 mmHg with average heart rate of 80 bpm. Moderate to severe mitral annular calcification.  4. The aortic valve is tricuspid. There is moderate calcification of the aortic valve. There is moderate thickening of the aortic valve. Aortic valve regurgitation is not visualized. FINDINGS  Left Ventricle: Left ventricular ejection fraction, by estimation, is >75%. The left ventricle has hyperdynamic function. The left ventricle has no regional wall motion abnormalities. The left ventricular internal cavity size was normal in size. There is mild concentric left ventricular hypertrophy. Left ventricular diastolic parameters are consistent with Grade I diastolic dysfunction (impaired relaxation). Elevated left atrial pressure. Right Ventricle: The right ventricular size is normal. No increase in right ventricular wall thickness. Right ventricular systolic function is normal. Tricuspid regurgitation signal is inadequate for assessing PA pressure. Left Atrium: Left atrial size was normal in size. Right Atrium: Right atrial size was normal in size. Pericardium: There is no evidence of pericardial effusion. Mitral Valve: The mitral valve is degenerative in appearance. Moderate to severe mitral annular calcification. No evidence of mitral valve regurgitation. Mild mitral valve stenosis. MV peak gradient, 8.0 mmHg. The mean mitral valve gradient is 4.0 mmHg with average heart rate of 80 bpm. Tricuspid Valve: The tricuspid valve is normal in structure. Tricuspid valve regurgitation is not demonstrated. Aortic Valve: The aortic valve is tricuspid. There is moderate calcification of the aortic valve. There is moderate thickening of the aortic valve. Aortic valve regurgitation is not visualized. Pulmonic Valve: The pulmonic valve was not well visualized. Pulmonic valve regurgitation is not visualized. Aorta: The aortic root and ascending aorta are  structurally normal, with no evidence of dilitation. IAS/Shunts: The interatrial septum was not well visualized.  LEFT VENTRICLE PLAX 2D LVIDd:         3.40 cm   Diastology LVIDs:         2.00 cm   LV e' medial:    2.94 cm/s LV PW:         1.20 cm   LV E/e' medial:  27.2 LV IVS:  1.20 cm   LV e' lateral:   5.66 cm/s LVOT diam:     1.80 cm   LV E/e' lateral: 14.2 LV SV:         45 LV SV Index:   30 LVOT Area:     2.54 cm  RIGHT VENTRICLE RV S prime:     9.14 cm/s TAPSE (M-mode): 1.8 cm LEFT ATRIUM             Index LA diam:        2.90 cm 1.97 cm/m LA Vol (A2C):   49.5 ml 33.66 ml/m LA Vol (A4C):   64.0 ml 43.52 ml/m LA Biplane Vol: 58.4 ml 39.71 ml/m  AORTIC VALVE LVOT Vmax:   79.60 cm/s LVOT Vmean:  58.400 cm/s LVOT VTI:    0.175 m  AORTA Ao Root diam: 2.70 cm MITRAL VALVE MV Area (PHT): 2.85 cm     SHUNTS MV Area VTI:   1.59 cm     Systemic VTI:  0.18 m MV Peak grad:  8.0 mmHg     Systemic Diam: 1.80 cm MV Mean grad:  4.0 mmHg MV Vmax:       1.41 m/s MV Vmean:      97.2 cm/s MV Decel Time: 266 msec MV E velocity: 80.10 cm/s MV A velocity: 120.00 cm/s MV E/A ratio:  0.67 Mihai Croitoru MD Electronically signed by Sanda Klein MD Signature Date/Time: 05/25/2022/4:01:17 PM    Final    CT ABDOMEN PELVIS WO CONTRAST  Result Date: 05/24/2022 CLINICAL DATA:  Abdominal pain EXAM: CT ABDOMEN AND PELVIS WITHOUT CONTRAST TECHNIQUE: Multidetector CT imaging of the abdomen and pelvis was performed following the standard protocol without IV contrast. RADIATION DOSE REDUCTION: This exam was performed according to the departmental dose-optimization program which includes automated exposure control, adjustment of the mA and/or kV according to patient size and/or use of iterative reconstruction technique. COMPARISON:  05/30/2020 FINDINGS: Lower chest: Dependent opacities in the lower lobes, likely dependent atelectasis. Hepatobiliary: No focal hepatic abnormality.  Prior cholecystectomy. Pancreas: No focal  abnormality or ductal dilatation. Spleen: No focal abnormality.  Normal size. Adrenals/Urinary Tract: Left interpolar calyceal diverticulum again noted containing layering stones, unchanged since prior study. Renal vascular calcifications in the renal hila bilaterally. No hydronephrosis. Adrenal glands and urinary bladder unremarkable. Stomach/Bowel: Extensive sigmoid diverticulosis. No active diverticulitis. Diffuse gaseous distention of bowel without evidence of obstruction. This may reflect mild ileus. Vascular/Lymphatic: Diffuse aortic and iliac calcifications. No evidence of aneurysm or adenopathy. Reproductive: Prior hysterectomy.  No adnexal masses. Other: No free fluid or free air. Musculoskeletal: No acute bony abnormality. 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 No ureteral stones or hydronephrosis. Dependent atelectasis in the lower lobes. Diffuse gaseous distention of bowel may reflect ileus. Aortoiliac atherosclerosis. Electronically Signed   By: Rolm Baptise M.D.   On: 05/24/2022 23:18   CT Head Wo Contrast  Result Date: 05/24/2022 CLINICAL DATA:  Memory loss Mental status change, unknown cause EXAM: CT HEAD WITHOUT CONTRAST TECHNIQUE: Contiguous axial images were obtained from the base of the skull through the vertex without intravenous contrast. RADIATION DOSE REDUCTION: This exam was performed according to the departmental dose-optimization program which includes automated exposure control, adjustment of the mA and/or kV according to patient size and/or use of iterative reconstruction technique. COMPARISON:  03/23/2021 FINDINGS: Brain: There is atrophy and chronic small vessel disease changes. No acute intracranial abnormality. Specifically, no hemorrhage, hydrocephalus, mass lesion, acute infarction, or significant intracranial injury. Vascular:  No hyperdense vessel or unexpected calcification. Extensive vertebral and carotid vascular calcifications. Skull: No  acute calvarial abnormality. Sinuses/Orbits: No acute findings Other: None IMPRESSION: Atrophy, chronic microvascular disease. No acute intracranial abnormality. Electronically Signed   By: Rolm Baptise M.D.   On: 05/24/2022 23:12   DG Chest Port 1 View  Result Date: 05/24/2022 CLINICAL DATA:  Questionable sepsis - evaluate for abnormality EXAM: PORTABLE CHEST 1 VIEW COMPARISON:  05/29/2020 FINDINGS: Left pacer remains in place, unchanged. Heart and mediastinal contours are within normal limits. No focal opacities or effusions. No acute bony abnormality. Prior right shoulder replacement, unchanged. IMPRESSION: No active cardiopulmonary disease. Electronically Signed   By: Rolm Baptise M.D.   On: 05/24/2022 21:02    Microbiology: Results for orders placed or performed during the hospital encounter of 05/24/22  Urine Culture     Status: Abnormal   Collection Time: 05/24/22  3:54 AM   Specimen: In/Out Cath Urine  Result Value Ref Range Status   Specimen Description IN/OUT CATH URINE  Final   Special Requests   Final    NONE Performed at [facility], [address] [place], [place] [address]    Culture MULTIPLE SPECIES PRESENT, SUGGEST RECOLLECTION (A)  Final   Report Status 05/26/2022 FINAL  Final  Blood Culture (routine x 2)     Status: None (Preliminary result)   Collection Time: 05/24/22  9:00 PM   Specimen: BLOOD RIGHT FOREARM  Result Value Ref Range Status   Specimen Description BLOOD RIGHT FOREARM  Final   Special Requests   Final    BOTTLES DRAWN AEROBIC AND ANAEROBIC Blood Culture adequate volume   Culture   Final    NO GROWTH 4 DAYS Performed at [facility], [place] [address], [place], [place] [address]    Report Status PENDING  Incomplete  Urine Culture     Status: None   Collection Time: 05/25/22  6:54 AM   Specimen: Urine, Clean Catch  Result Value Ref Range Status   Specimen Description URINE, CLEAN CATCH  Final   Special Requests NONE  Final   Culture    Final    NO GROWTH Performed at [facility], [place] [address] [place], [place] [address]    Report Status 05/26/2022 FINAL  Final  Respiratory (~20 pathogens) panel by PCR     Status: None   Collection Time: 05/25/22  9:40 AM   Specimen: Nasopharyngeal Swab; Respiratory  Result Value Ref Range Status   Adenovirus NOT DETECTED NOT DETECTED Final   Coronavirus 229E NOT DETECTED NOT DETECTED Final    Comment: (NOTE) The Coronavirus on the Respiratory Panel, DOES NOT test for the novel  Coronavirus (2019 nCoV)    Coronavirus HKU1 NOT DETECTED NOT DETECTED Final   Coronavirus NL63 NOT DETECTED NOT DETECTED Final   Coronavirus OC43 NOT DETECTED NOT DETECTED Final   Metapneumovirus NOT DETECTED NOT DETECTED Final   Rhinovirus / Enterovirus NOT DETECTED NOT DETECTED Final   Influenza A NOT DETECTED NOT DETECTED Final   Influenza B NOT DETECTED NOT DETECTED Final   Parainfluenza Virus 1 NOT DETECTED NOT DETECTED Final   Parainfluenza Virus 2 NOT DETECTED NOT DETECTED Final   Parainfluenza Virus 3 NOT DETECTED NOT DETECTED Final   Parainfluenza Virus 4 NOT DETECTED NOT DETECTED Final   Respiratory Syncytial Virus NOT DETECTED NOT DETECTED Final   Bordetella pertussis NOT DETECTED NOT DETECTED Final   Bordetella Parapertussis NOT DETECTED NOT DETECTED Final  Chlamydophila pneumoniae NOT DETECTED NOT DETECTED Final   Mycoplasma pneumoniae NOT DETECTED NOT DETECTED Final    Comment: Performed at [facility], [place] [address] [place], [place] [address]    Labs: CBC: Recent Labs  Lab 05/24/22 2108 05/25/22 0706 05/25/22 0725 05/26/22 0651 05/27/22 0214 05/28/22 0308  WBC 6.3 6.5  --  6.7 6.1 5.4  NEUTROABS 4.1 4.0  --   --   --   --   HGB 13.2 13.5 13.6 13.9 13.5 13.3  HCT 41.1 40.4 40.0 41.9 41.3 40.0  MCV 99.8 96.2  --  96.8 96.5 97.1  PLT 165 168  --  196 206 169   Basic Metabolic Panel: Recent Labs  Lab 05/24/22 2108 05/25/22 0706 05/25/22 0725 05/26/22 0651  05/27/22 0214 05/28/22 0308  NA 138 140 139 140 141 142  K 4.0 3.5 3.5 3.3* 4.2 3.8  CL 101 104  --  107 110 109  CO2 24 23  --  '24 22 23  '$ GLUCOSE 109* 103*  --  107* 114* 94  BUN 17 13  --  '12 22 19  '$ CREATININE 1.38* 1.21*  --  1.15* 1.37* 1.19*  CALCIUM 10.2 9.7  --  9.7 10.1 9.8  MG  --   --   --   --  2.1 1.9   Liver Function Tests: Recent Labs  Lab 05/24/22 2108 05/25/22 0706 05/26/22 0651  AST 39 36 41  ALT '21 24 27  '$ ALKPHOS 45 46 46  BILITOT 0.9 1.2 1.3*  PROT 6.9 7.0 6.9  ALBUMIN 3.7 3.5 3.4*   CBG: No results for input(s): "GLUCAP" in the last 168 hours.  Discharge time spent: greater than 30 minutes.  Signed: Flora Lipps, MD [facility] 05/28/2022

## 2022-05-28 NOTE — Progress Notes (Signed)
   I have spoke to the Daughter [name] this am and discussed PT recommendations for equipment in home. The daughter at this time still feels that they do not need any equipment at this time. However, is in agreement to after getting home if she finds that there are equipment needs then she is in agreement with [place] placing at that time.    We tentatively have her on our schedule today for hospice enrollment in home if she is medically stable for 200pm.   Webb Silversmith RN BSN [facility] [phone]

## 2022-05-28 NOTE — TOC Transition Note (Signed)
Transition of Care [facility]) - CM/SW Discharge Note   Patient Details  Name: Julie Bass MRN: [id number] Date of Birth: [birth year]  Transition of [facility] CM/SW Contact:  Pollie Friar, RN Phone Number: 05/28/2022, 10:43 AM   Clinical Narrative:    Pt is discharging home with hospice services through [place]. Cheri with [facility] has talked with the family and no DME needs currently. Family insistent on providing transport home. Hospice to meet at the home today around 2 pm.  No further needs per TOC.    Final next level of care: Home w Hospice Care Barriers to Discharge: No Barriers Identified   Patient Goals and CMS Choice   CMS Medicare.gov Compare Post Acute Care list provided to:: Patient Represenative (must comment) Choice offered to / list presented to : Adult Children  Discharge Placement                       Discharge Plan and Services   Discharge Planning Services: CM Consult                        [facility] Agency: [place] Date [place]: 05/27/22   Representative spoke with at [place]: Cheri  Social Determinants of Health ([place]) Interventions     Readmission Risk Interventions     No data to display

## 2022-05-28 NOTE — Care Management Important Message (Signed)
Important Message  Patient Details  Name: TAHLOR BERENGUER MRN: [id number] Date of Birth: 23-Jun-1926   Medicare Important Message Given:  Yes  Patient left prior to IM delivery will mail IM to the patient home address.     Rachana Malesky 05/28/2022, 3:05 PM

## 2022-05-29 LAB — CULTURE, BLOOD (ROUTINE X 2)
Culture: NO GROWTH
Special Requests: ADEQUATE

## 2022-06-02 ENCOUNTER — Telehealth: Payer: Self-pay

## 2022-06-02 NOTE — Telephone Encounter (Signed)
Lvm for pt daughter to call back . Not sure what she needed. [place]

## 2022-06-07 ENCOUNTER — Other Ambulatory Visit: Payer: Medicare Other | Admitting: Hospice

## 2022-06-29 ENCOUNTER — Ambulatory Visit (INDEPENDENT_AMBULATORY_CARE_PROVIDER_SITE_OTHER)

## 2022-06-29 DIAGNOSIS — I442 Atrioventricular block, complete: Secondary | ICD-10-CM | POA: Diagnosis not present

## 2022-06-29 LAB — CUP PACEART REMOTE DEVICE CHECK
Date Time Interrogation Session: 20230815072552
Implantable Lead Implant Date: 20190225
Implantable Lead Implant Date: 20190225
Implantable Lead Location: 753859
Implantable Lead Location: 753860
Implantable Lead Model: 377
Implantable Lead Model: 377
Implantable Lead Serial Number: 80514208
Implantable Lead Serial Number: 80598782
Implantable Pulse Generator Implant Date: 20190225
Pulse Gen Model: 407145
Pulse Gen Serial Number: 69245949

## 2022-07-21 ENCOUNTER — Encounter: Payer: Self-pay | Admitting: Internal Medicine

## 2022-07-27 DIAGNOSIS — H401133 Primary open-angle glaucoma, bilateral, severe stage: Secondary | ICD-10-CM | POA: Diagnosis not present

## 2022-07-28 ENCOUNTER — Telehealth: Payer: Self-pay

## 2022-07-28 NOTE — Patient Outreach (Signed)
  Care Coordination   07/28/2022 Name: EMMALEE SOLIVAN MRN: [id number] DOB: 1926-08-08   Care Coordination Outreach Attempts:  An unsuccessful telephone outreach was attempted today to offer the patient information about available care coordination services as a benefit of their health plan.   Follow Up Plan:  Additional outreach attempts will be made to offer the patient care coordination information and services.   Encounter Outcome:  No Answer  Care Coordination Interventions Activated:  No   Care Coordination Interventions:  No, not indicated      Enzo Montgomery, RN,BSN,CCM [facility] Telephonic Care Management Coordinator Direct Phone: [phone] Toll Free: [phone] Fax: [phone]

## 2022-07-30 NOTE — Progress Notes (Signed)
Remote pacemaker transmission.   

## 2022-08-04 ENCOUNTER — Telehealth: Payer: Self-pay

## 2022-08-04 NOTE — Patient Outreach (Signed)
  Care Coordination   08/04/2022 Name: Julie Bass MRN: [id number] DOB: Jul 17, 1926   Care Coordination Outreach Attempts:  A second unsuccessful outreach was attempted today to offer the patient with information about available care coordination services as a benefit of their health plan.     Follow Up Plan:  Additional outreach attempts will be made to offer the patient care coordination information and services.   Encounter Outcome:  No Answer  Care Coordination Interventions Activated:  No   Care Coordination Interventions:  No, not indicated      Enzo Montgomery, RN,BSN,CCM [facility] Telephonic Care Management Coordinator Direct Phone: [phone] Toll Free: [phone] Fax: [phone]

## 2022-08-06 ENCOUNTER — Other Ambulatory Visit: Payer: Self-pay | Admitting: Family Medicine

## 2022-08-06 ENCOUNTER — Telehealth: Payer: Self-pay

## 2022-08-06 NOTE — Patient Outreach (Signed)
  Care Coordination   08/06/2022 Name: Julie Bass MRN: [id number] DOB: 1926/05/24   Care Coordination Outreach Attempts:  A third unsuccessful outreach was attempted today to offer the patient with information about available care coordination services as a benefit of their health plan.   Follow Up Plan:  No further outreach attempts will be made at this time. We have been unable to contact the patient to offer or enroll patient in care coordination services  Encounter Outcome:  No Answer  Care Coordination Interventions Activated:  No   Care Coordination Interventions:  No, not indicated     Enzo Montgomery, RN,BSN,CCM [facility] Telephonic Care Management Coordinator Direct Phone: [phone] Toll Free: [phone] Fax: [phone]

## 2022-08-06 NOTE — Telephone Encounter (Signed)
[facility]. Please advise [facility] °

## 2022-08-24 ENCOUNTER — Encounter: Payer: Self-pay | Admitting: Internal Medicine

## 2022-08-25 ENCOUNTER — Telehealth: Payer: Self-pay | Admitting: Family Medicine

## 2022-08-25 NOTE — Telephone Encounter (Signed)
[facility] is releasing pt from hospice care to palliative care, Any questions Julie Bass can be reached at [phone]

## 2022-08-26 ENCOUNTER — Telehealth: Payer: Self-pay | Admitting: Family Medicine

## 2022-08-26 NOTE — Telephone Encounter (Signed)
Manus Gunning from [facility] called and stated Julie Bass will be getting discharged because she is doing much better but they would like an order for her to have palliative care instead. Provided call back number [phone]

## 2022-09-02 ENCOUNTER — Telehealth: Payer: Self-pay | Admitting: Family Medicine

## 2022-09-02 NOTE — Telephone Encounter (Signed)
Julie Bass from [facility] called back again hasn't heard back from last weeks request for order for her to have palliative care. Provided call back number [phone].  They just need verbal order.

## 2022-09-02 NOTE — Telephone Encounter (Signed)
Called Manus Gunning & left verbal authorization on her voice mail for palliative care

## 2022-09-06 ENCOUNTER — Encounter: Payer: Self-pay | Admitting: Internal Medicine

## 2022-09-19 ENCOUNTER — Other Ambulatory Visit: Payer: Self-pay | Admitting: Cardiovascular Disease

## 2022-09-20 DIAGNOSIS — H401133 Primary open-angle glaucoma, bilateral, severe stage: Secondary | ICD-10-CM | POA: Diagnosis not present

## 2022-09-23 DIAGNOSIS — R8271 Bacteriuria: Secondary | ICD-10-CM | POA: Diagnosis not present

## 2022-09-28 ENCOUNTER — Ambulatory Visit (INDEPENDENT_AMBULATORY_CARE_PROVIDER_SITE_OTHER): Payer: Medicare Other

## 2022-09-28 DIAGNOSIS — I442 Atrioventricular block, complete: Secondary | ICD-10-CM

## 2022-09-29 LAB — CUP PACEART REMOTE DEVICE CHECK
Date Time Interrogation Session: 20231115073519
Implantable Lead Connection Status: 753985
Implantable Lead Connection Status: 753985
Implantable Lead Implant Date: 20190225
Implantable Lead Implant Date: 20190225
Implantable Lead Location: 753859
Implantable Lead Location: 753860
Implantable Lead Model: 377
Implantable Lead Model: 377
Implantable Lead Serial Number: 80514208
Implantable Lead Serial Number: 80598782
Implantable Pulse Generator Implant Date: 20190225
Pulse Gen Model: 407145
Pulse Gen Serial Number: 69245949

## 2022-10-08 ENCOUNTER — Other Ambulatory Visit: Payer: Self-pay | Admitting: Cardiovascular Disease

## 2022-10-11 ENCOUNTER — Other Ambulatory Visit: Payer: Self-pay

## 2022-10-11 MED ORDER — AMLODIPINE BESYLATE 5 MG PO TABS: 5.0000 mg | ORAL_TABLET | Freq: Every evening | ORAL | 3 refills | Status: DC

## 2022-10-11 NOTE — Telephone Encounter (Signed)
Patient daughter called in over the weekend, with elevated BP readings systolic 072. Dr.Kelly verbalized to increase Amlodipine to 5 mg daily.   Sent to pharmacy.

## 2022-10-13 ENCOUNTER — Emergency Department (HOSPITAL_COMMUNITY): Payer: Medicare Other

## 2022-10-13 ENCOUNTER — Inpatient Hospital Stay (HOSPITAL_COMMUNITY)
Admission: EM | Admit: 2022-10-13 | Discharge: 2022-10-20 | DRG: 070 | Disposition: A | Discharge status: Hospice | Payer: Medicare Other | Attending: Family Medicine | Admitting: Family Medicine

## 2022-10-13 ENCOUNTER — Other Ambulatory Visit: Payer: Self-pay

## 2022-10-13 DIAGNOSIS — J189 Pneumonia, unspecified organism: Secondary | ICD-10-CM | POA: Diagnosis not present

## 2022-10-13 DIAGNOSIS — E785 Hyperlipidemia, unspecified: Secondary | ICD-10-CM | POA: Diagnosis present

## 2022-10-13 DIAGNOSIS — Z7189 Other specified counseling: Secondary | ICD-10-CM | POA: Diagnosis not present

## 2022-10-13 DIAGNOSIS — R159 Full incontinence of feces: Secondary | ICD-10-CM | POA: Diagnosis present

## 2022-10-13 DIAGNOSIS — J918 Pleural effusion in other conditions classified elsewhere: Secondary | ICD-10-CM | POA: Diagnosis present

## 2022-10-13 DIAGNOSIS — Z79899 Other long term (current) drug therapy: Secondary | ICD-10-CM

## 2022-10-13 DIAGNOSIS — Z711 Person with feared health complaint in whom no diagnosis is made: Secondary | ICD-10-CM | POA: Diagnosis not present

## 2022-10-13 DIAGNOSIS — G9341 Metabolic encephalopathy: Principal | ICD-10-CM | POA: Diagnosis present

## 2022-10-13 DIAGNOSIS — I5032 Chronic diastolic (congestive) heart failure: Secondary | ICD-10-CM | POA: Diagnosis present

## 2022-10-13 DIAGNOSIS — J9601 Acute respiratory failure with hypoxia: Secondary | ICD-10-CM | POA: Diagnosis present

## 2022-10-13 DIAGNOSIS — Z96611 Presence of right artificial shoulder joint: Secondary | ICD-10-CM | POA: Diagnosis present

## 2022-10-13 DIAGNOSIS — E870 Hyperosmolality and hypernatremia: Secondary | ICD-10-CM | POA: Diagnosis present

## 2022-10-13 DIAGNOSIS — Z23 Encounter for immunization: Secondary | ICD-10-CM | POA: Diagnosis not present

## 2022-10-13 DIAGNOSIS — F0390 Unspecified dementia without behavioral disturbance: Secondary | ICD-10-CM | POA: Diagnosis present

## 2022-10-13 DIAGNOSIS — Z8249 Family history of ischemic heart disease and other diseases of the circulatory system: Secondary | ICD-10-CM

## 2022-10-13 DIAGNOSIS — H9190 Unspecified hearing loss, unspecified ear: Secondary | ICD-10-CM | POA: Diagnosis present

## 2022-10-13 DIAGNOSIS — Z808 Family history of malignant neoplasm of other organs or systems: Secondary | ICD-10-CM

## 2022-10-13 DIAGNOSIS — J90 Pleural effusion, not elsewhere classified: Secondary | ICD-10-CM | POA: Diagnosis present

## 2022-10-13 DIAGNOSIS — Z66 Do not resuscitate: Secondary | ICD-10-CM | POA: Diagnosis present

## 2022-10-13 DIAGNOSIS — Z87891 Personal history of nicotine dependence: Secondary | ICD-10-CM | POA: Diagnosis not present

## 2022-10-13 DIAGNOSIS — Z1152 Encounter for screening for COVID-19: Secondary | ICD-10-CM | POA: Diagnosis not present

## 2022-10-13 DIAGNOSIS — K219 Gastro-esophageal reflux disease without esophagitis: Secondary | ICD-10-CM | POA: Diagnosis present

## 2022-10-13 DIAGNOSIS — E782 Mixed hyperlipidemia: Secondary | ICD-10-CM | POA: Diagnosis not present

## 2022-10-13 DIAGNOSIS — J9811 Atelectasis: Secondary | ICD-10-CM | POA: Diagnosis present

## 2022-10-13 DIAGNOSIS — R6889 Other general symptoms and signs: Secondary | ICD-10-CM | POA: Diagnosis not present

## 2022-10-13 DIAGNOSIS — Z87442 Personal history of urinary calculi: Secondary | ICD-10-CM

## 2022-10-13 DIAGNOSIS — E43 Unspecified severe protein-calorie malnutrition: Secondary | ICD-10-CM | POA: Diagnosis present

## 2022-10-13 DIAGNOSIS — Z90710 Acquired absence of both cervix and uterus: Secondary | ICD-10-CM

## 2022-10-13 DIAGNOSIS — R1310 Dysphagia, unspecified: Secondary | ICD-10-CM | POA: Diagnosis present

## 2022-10-13 DIAGNOSIS — I340 Nonrheumatic mitral (valve) insufficiency: Secondary | ICD-10-CM | POA: Diagnosis present

## 2022-10-13 DIAGNOSIS — Z7401 Bed confinement status: Secondary | ICD-10-CM

## 2022-10-13 DIAGNOSIS — R0689 Other abnormalities of breathing: Secondary | ICD-10-CM | POA: Diagnosis not present

## 2022-10-13 DIAGNOSIS — R638 Other symptoms and signs concerning food and fluid intake: Secondary | ICD-10-CM | POA: Diagnosis not present

## 2022-10-13 DIAGNOSIS — L89152 Pressure ulcer of sacral region, stage 2: Secondary | ICD-10-CM | POA: Diagnosis present

## 2022-10-13 DIAGNOSIS — Z743 Need for continuous supervision: Secondary | ICD-10-CM | POA: Diagnosis not present

## 2022-10-13 DIAGNOSIS — M810 Age-related osteoporosis without current pathological fracture: Secondary | ICD-10-CM | POA: Diagnosis present

## 2022-10-13 DIAGNOSIS — R531 Weakness: Secondary | ICD-10-CM | POA: Diagnosis not present

## 2022-10-13 DIAGNOSIS — E876 Hypokalemia: Secondary | ICD-10-CM | POA: Diagnosis present

## 2022-10-13 DIAGNOSIS — H409 Unspecified glaucoma: Secondary | ICD-10-CM | POA: Diagnosis present

## 2022-10-13 DIAGNOSIS — N1831 Chronic kidney disease, stage 3a: Secondary | ICD-10-CM | POA: Diagnosis present

## 2022-10-13 DIAGNOSIS — I130 Hypertensive heart and chronic kidney disease with heart failure and stage 1 through stage 4 chronic kidney disease, or unspecified chronic kidney disease: Secondary | ICD-10-CM | POA: Diagnosis present

## 2022-10-13 DIAGNOSIS — Z515 Encounter for palliative care: Secondary | ICD-10-CM | POA: Diagnosis not present

## 2022-10-13 DIAGNOSIS — Z681 Body mass index (BMI) 19 or less, adult: Secondary | ICD-10-CM

## 2022-10-13 DIAGNOSIS — Z800 Family history of malignant neoplasm of digestive organs: Secondary | ICD-10-CM

## 2022-10-13 DIAGNOSIS — Z8049 Family history of malignant neoplasm of other genital organs: Secondary | ICD-10-CM

## 2022-10-13 DIAGNOSIS — E8809 Other disorders of plasma-protein metabolism, not elsewhere classified: Secondary | ICD-10-CM | POA: Diagnosis present

## 2022-10-13 DIAGNOSIS — I739 Peripheral vascular disease, unspecified: Secondary | ICD-10-CM | POA: Diagnosis present

## 2022-10-13 DIAGNOSIS — Z789 Other specified health status: Secondary | ICD-10-CM | POA: Diagnosis not present

## 2022-10-13 DIAGNOSIS — R0602 Shortness of breath: Secondary | ICD-10-CM | POA: Diagnosis not present

## 2022-10-13 DIAGNOSIS — I10 Essential (primary) hypertension: Secondary | ICD-10-CM | POA: Diagnosis not present

## 2022-10-13 DIAGNOSIS — I499 Cardiac arrhythmia, unspecified: Secondary | ICD-10-CM | POA: Diagnosis not present

## 2022-10-13 DIAGNOSIS — Z9049 Acquired absence of other specified parts of digestive tract: Secondary | ICD-10-CM

## 2022-10-13 DIAGNOSIS — E871 Hypo-osmolality and hyponatremia: Secondary | ICD-10-CM | POA: Diagnosis present

## 2022-10-13 DIAGNOSIS — Z803 Family history of malignant neoplasm of breast: Secondary | ICD-10-CM

## 2022-10-13 DIAGNOSIS — Z888 Allergy status to other drugs, medicaments and biological substances status: Secondary | ICD-10-CM

## 2022-10-13 DIAGNOSIS — Z87440 Personal history of urinary (tract) infections: Secondary | ICD-10-CM

## 2022-10-13 LAB — COMPREHENSIVE METABOLIC PANEL
ALT: 33 U/L (ref 0–44)
AST: 34 U/L (ref 15–41)
Albumin: 2.8 g/dL — ABNORMAL LOW (ref 3.5–5.0)
Alkaline Phosphatase: 49 U/L (ref 38–126)
Anion gap: 11 (ref 5–15)
BUN: 18 mg/dL (ref 8–23)
CO2: 25 mmol/L (ref 22–32)
Calcium: 9.5 mg/dL (ref 8.9–10.3)
Chloride: 110 mmol/L (ref 98–111)
Creatinine, Ser: 0.94 mg/dL (ref 0.44–1.00)
GFR, Estimated: 56 mL/min — ABNORMAL LOW (ref >60)
Glucose, Bld: 118 mg/dL — ABNORMAL HIGH (ref 70–99)
Potassium: 3.8 mmol/L (ref 3.5–5.1)
Sodium: 146 mmol/L — ABNORMAL HIGH (ref 135–145)
Total Bilirubin: 0.9 mg/dL (ref 0.3–1.2)
Total Protein: 6.5 g/dL (ref 6.5–8.1)

## 2022-10-13 LAB — CBC WITH DIFFERENTIAL/PLATELET
Abs Immature Granulocytes: 0.03 10*3/uL (ref 0.00–0.07)
Basophils Absolute: 0.1 10*3/uL (ref 0.0–0.1)
Basophils Relative: 1 %
Eosinophils Absolute: 0.2 10*3/uL (ref 0.0–0.5)
Eosinophils Relative: 2 %
HCT: 45 % (ref 36.0–46.0)
Hemoglobin: 14.6 g/dL (ref 12.0–15.0)
Immature Granulocytes: 0 %
Lymphocytes Relative: 23 %
Lymphs Abs: 1.8 10*3/uL (ref 0.7–4.0)
MCH: 32.1 pg (ref 26.0–34.0)
MCHC: 32.4 g/dL (ref 30.0–36.0)
MCV: 98.9 fL (ref 80.0–100.0)
Monocytes Absolute: 0.7 10*3/uL (ref 0.1–1.0)
Monocytes Relative: 9 %
Neutro Abs: 5.2 10*3/uL (ref 1.7–7.7)
Neutrophils Relative %: 65 %
Platelets: 237 10*3/uL (ref 150–400)
RBC: 4.55 MIL/uL (ref 3.87–5.11)
RDW: 13.9 % (ref 11.5–15.5)
WBC: 8 10*3/uL (ref 4.0–10.5)
nRBC: 0 % (ref 0.0–0.2)

## 2022-10-13 NOTE — ED Provider Notes (Signed)
[facility] EMERGENCY DEPARTMENT Provider Note   CSN: [id number] Arrival date & time: 10/13/22  1905     History  Chief Complaint  Patient presents with   Weakness    Julie Bass is a 88 y.o. female.   Weakness Patient with generalized weakness confusion.  Baseline dementia.  Gets taken care of by family members at home.  Reportedly has had some missed dosing of her blood pressure medicines.  However has been more confused the last couple days.  Potentially not moving the right leg as much also.  Patient cannot really provide much history.  Confused.  History comes from family member.    Past Medical History:  Diagnosis Date   Arthritis    Chronic kidney disease    KIDNEY STONES   Dementia ([place])    BEGINNING STAGES    Depression    GERD (gastroesophageal reflux disease)    Glaucoma    Hiatal hernia    HTN (hypertension) 10/18/2011   Hyperlipemia 10/18/2011   NSVT (nonsustained ventricular tachycardia) (HCC) 10/18/2011   Osteoporosis    PAD (peripheral artery disease) ([place]) 10/18/2011   h/o left SFA stent    Home Medications Prior to Admission medications   Medication Sig Start Date End Date Taking? Authorizing Provider  amLODipine (NORVASC) 5 MG tablet Take 1 tablet (5 mg total) by mouth every evening. Keep appointment for further refills. 10/11/22   Troy Sine, MD  Biotin 10000 MCG TABS Take 10,000 mcg by mouth daily with breakfast.    [provider]  cilostazol (PLETAL) 50 MG tablet TAKE 1 TABLET BY MOUTH TWICE A DAY Patient taking differently: Take 50 mg by mouth 2 (two) times daily with a meal. 02/26/22   Croitoru, Mihai, MD  citalopram (CELEXA) 20 MG tablet TAKE 1 TABLET BY MOUTH EVERY DAY 08/06/22   Denita Lung, MD  CRANBERRY PO Take 1 capsule by mouth 2 (two) times daily with a meal.    [provider]  ezetimibe (ZETIA) 10 MG tablet TAKE 1 TABLET BY MOUTH EVERY DAY 09/20/22   Troy Sine, MD  feeding  supplement, ENSURE COMPLETE, (ENSURE COMPLETE) LIQD Take 237 mLs by mouth 3 (three) times daily between meals. 08/27/14   Barrett, Evelene Croon, PA-C  hydrALAZINE (APRESOLINE) 25 MG tablet Take 1 tablet (25 mg total) by mouth 3 (three) times daily. 05/28/22 08/26/22  Pokhrel, Corrie Mckusick, MD  latanoprost (XALATAN) 0.005 % ophthalmic solution Place 1 drop into both eyes 2 (two) times daily. 04/28/22   [provider]  metoprolol succinate (TOPROL-XL) 100 MG 24 hr tablet TAKE 1 TABLET BY MOUTH EVERY EVENING. Patient taking differently: Take 100 mg by mouth daily with supper. 05/12/22   Troy Sine, MD  mirtazapine (REMERON SOL-TAB) 15 MG disintegrating tablet TAKE 1 TABLET BY MOUTH EVERYDAY AT BEDTIME Patient not taking: Reported on 05/25/2022 01/28/22   Denita Lung, MD  omega-3 acid ethyl esters (LOVAZA) 1 g capsule TAKE 1 CAPSULE (1 G TOTAL) BY MOUTH EVERY EVENING. Patient taking differently: Take 1 g by mouth daily with supper. 11/18/21   Troy Sine, MD  pantoprazole (PROTONIX) 40 MG tablet TAKE 1 TABLET (40 MG TOTAL) BY MOUTH DAILY AS NEEDED (FOR ACID REFLUX). Patient taking differently: Take 40 mg by mouth daily with breakfast. 05/12/22   Troy Sine, MD  timolol (TIMOPTIC) 0.5 % ophthalmic solution Place 1 drop into both eyes 2 (two) times daily.  Patient not taking: Reported on 05/25/2022  [provider]      Allergies    Crestor [rosuvastatin calcium] and Lipitor [atorvastatin calcium]    Review of Systems   Review of Systems  Neurological:  Positive for weakness.    Physical Exam Updated Vital Signs BP (!) 142/104 (BP Location: Right Arm)   Pulse 82   Temp (!) 97.5 F (36.4 C) (Oral)   Resp 18   SpO2 94%  Physical Exam Vitals and nursing note reviewed.  Constitutional:      Appearance: Normal appearance.  HENT:     Head: Atraumatic.  Eyes:     Pupils: Pupils are equal, round, and reactive to light.  Pulmonary:     Comments: Pacemaker to left chest  wall. Abdominal:     Tenderness: There is no abdominal tenderness.  Musculoskeletal:        General: No signs of injury.  Skin:    General: Skin is warm.  Neurological:     Comments: Awake and confused.  Will move bilateral upper extremities but will not really follow commands for me.     ED Results / Procedures / Treatments   Labs (all labs ordered are listed, but only abnormal results are displayed) Labs Reviewed  COMPREHENSIVE METABOLIC PANEL - Abnormal; Notable for the following components:      Result Value   Sodium 146 (*)    Glucose, Bld 118 (*)    Albumin 2.8 (*)    GFR, Estimated 56 (*)    All other components within normal limits  RESP PANEL BY RT-PCR (FLU A&B, COVID) ARPGX2  CBC WITH DIFFERENTIAL/PLATELET  URINALYSIS, ROUTINE W REFLEX MICROSCOPIC    EKG None  Radiology CT HEAD WO CONTRAST (5MM)  Result Date: 10/13/2022 CLINICAL DATA:  Increasing weakness for 1 week, dementia, urinary tract infection EXAM: CT HEAD WITHOUT CONTRAST TECHNIQUE: Contiguous axial images were obtained from the base of the skull through the vertex without intravenous contrast. RADIATION DOSE REDUCTION: This exam was performed according to the departmental dose-optimization program which includes automated exposure control, adjustment of the mA and/or kV according to patient size and/or use of iterative reconstruction technique. COMPARISON:  05/24/2022 FINDINGS: Brain: Diffuse cerebral atrophy. Chronic small-vessel ischemic changes throughout the periventricular white matter. No acute infarct or hemorrhage. Lateral ventricles and midline structures are unremarkable. No acute extra-axial fluid collections. No mass effect. Vascular: Diffuse atherosclerosis.  No hyperdense vessel. Skull: Normal. Negative for fracture or focal lesion. Sinuses/Orbits: No acute finding. Other: None. IMPRESSION: 1. Stable atrophy and chronic small vessel ischemic changes. No acute intracranial process. Electronically  Signed   By: Randa Ngo M.D.   On: 10/13/2022 22:22   DG Chest 2 View  Result Date: 10/13/2022 CLINICAL DATA:  Weakness EXAM: CHEST - 2 VIEW COMPARISON:  Radiographs 05/24/2022 FINDINGS: New moderate right pleural effusion and associated atelectasis/consolidation. Left lung is clear. No pneumothorax. Stable cardiomegaly. Aortic atherosclerotic calcification. Left chest wall pacemaker. IMPRESSION: New moderate right pleural effusion and associated atelectasis or pneumonia. Electronically Signed   By: Placido Sou M.D.   On: 10/13/2022 20:24    Procedures Procedures    Medications Ordered in ED Medications - No data to display  ED Course/ Medical Decision Making/ A&P                           Medical Decision Making Amount and/or Complexity of Data Reviewed Labs: ordered. Radiology: ordered.   Patient mental status change.  Reports of potentially dragging  right foot but not compliant enough with exam to be able to ambulate or move lower extremities much.  Not a TNK candidate due to time of onset with last normal of Monday.  Potentially has something more generalized such as UTI.  Has had a little bit of a cough.  Family ember states has had symptoms like that with UTI in the past.  We will get somewhat broad work-up with head CT chest x-ray blood work and urinalysis.  I reviewed previous discharge note.  Patient was at that time put on to hospice.  However discussed more with family and reportedly after 30 days patient was reevaluated and was doing so well she came off hospice and is now in palliative care.  Would want interventions done. CT head reassuring.  Chest x-ray independently interpreted does show pleural effusion on right.  Urinalysis still pending as is patient's urinalysis.  Will likely require admission to the hospital.  Care turned over to Dr. Ralene Bathe.       Final Clinical Impression(s) / ED Diagnoses Final diagnoses:  None    Rx / DC Orders ED Discharge  Orders     None         Davonna Belling, MD 10/13/22 2326

## 2022-10-13 NOTE — ED Triage Notes (Signed)
BIB EMS from home for a "stroke" . Increased weakness x 1 week. HX uti's Baseline only self  EMS:80s, 24, 91%RA, 235BGL,

## 2022-10-14 ENCOUNTER — Inpatient Hospital Stay (HOSPITAL_COMMUNITY): Payer: Medicare Other

## 2022-10-14 ENCOUNTER — Encounter (HOSPITAL_COMMUNITY): Payer: Self-pay | Admitting: Internal Medicine

## 2022-10-14 DIAGNOSIS — L89152 Pressure ulcer of sacral region, stage 2: Secondary | ICD-10-CM | POA: Diagnosis present

## 2022-10-14 DIAGNOSIS — Z681 Body mass index (BMI) 19 or less, adult: Secondary | ICD-10-CM | POA: Diagnosis not present

## 2022-10-14 DIAGNOSIS — E782 Mixed hyperlipidemia: Secondary | ICD-10-CM

## 2022-10-14 DIAGNOSIS — I5032 Chronic diastolic (congestive) heart failure: Secondary | ICD-10-CM | POA: Diagnosis present

## 2022-10-14 DIAGNOSIS — Z1152 Encounter for screening for COVID-19: Secondary | ICD-10-CM | POA: Diagnosis not present

## 2022-10-14 DIAGNOSIS — F0390 Unspecified dementia without behavioral disturbance: Secondary | ICD-10-CM | POA: Diagnosis present

## 2022-10-14 DIAGNOSIS — J90 Pleural effusion, not elsewhere classified: Secondary | ICD-10-CM | POA: Diagnosis present

## 2022-10-14 DIAGNOSIS — I340 Nonrheumatic mitral (valve) insufficiency: Secondary | ICD-10-CM | POA: Diagnosis present

## 2022-10-14 DIAGNOSIS — E785 Hyperlipidemia, unspecified: Secondary | ICD-10-CM | POA: Diagnosis present

## 2022-10-14 DIAGNOSIS — G9341 Metabolic encephalopathy: Secondary | ICD-10-CM | POA: Diagnosis present

## 2022-10-14 DIAGNOSIS — H9190 Unspecified hearing loss, unspecified ear: Secondary | ICD-10-CM | POA: Diagnosis present

## 2022-10-14 DIAGNOSIS — J189 Pneumonia, unspecified organism: Secondary | ICD-10-CM | POA: Diagnosis not present

## 2022-10-14 DIAGNOSIS — J9601 Acute respiratory failure with hypoxia: Secondary | ICD-10-CM | POA: Diagnosis present

## 2022-10-14 DIAGNOSIS — E870 Hyperosmolality and hypernatremia: Secondary | ICD-10-CM | POA: Diagnosis present

## 2022-10-14 DIAGNOSIS — I10 Essential (primary) hypertension: Secondary | ICD-10-CM

## 2022-10-14 DIAGNOSIS — Z87891 Personal history of nicotine dependence: Secondary | ICD-10-CM | POA: Diagnosis not present

## 2022-10-14 DIAGNOSIS — J9811 Atelectasis: Secondary | ICD-10-CM | POA: Diagnosis present

## 2022-10-14 DIAGNOSIS — Z66 Do not resuscitate: Secondary | ICD-10-CM | POA: Diagnosis present

## 2022-10-14 DIAGNOSIS — I739 Peripheral vascular disease, unspecified: Secondary | ICD-10-CM | POA: Diagnosis present

## 2022-10-14 DIAGNOSIS — I130 Hypertensive heart and chronic kidney disease with heart failure and stage 1 through stage 4 chronic kidney disease, or unspecified chronic kidney disease: Secondary | ICD-10-CM | POA: Diagnosis present

## 2022-10-14 DIAGNOSIS — Z515 Encounter for palliative care: Secondary | ICD-10-CM

## 2022-10-14 DIAGNOSIS — J918 Pleural effusion in other conditions classified elsewhere: Secondary | ICD-10-CM | POA: Diagnosis present

## 2022-10-14 DIAGNOSIS — E43 Unspecified severe protein-calorie malnutrition: Secondary | ICD-10-CM | POA: Diagnosis present

## 2022-10-14 DIAGNOSIS — R638 Other symptoms and signs concerning food and fluid intake: Secondary | ICD-10-CM | POA: Diagnosis not present

## 2022-10-14 DIAGNOSIS — E876 Hypokalemia: Secondary | ICD-10-CM | POA: Diagnosis present

## 2022-10-14 DIAGNOSIS — N1831 Chronic kidney disease, stage 3a: Secondary | ICD-10-CM | POA: Diagnosis present

## 2022-10-14 DIAGNOSIS — Z23 Encounter for immunization: Secondary | ICD-10-CM | POA: Diagnosis present

## 2022-10-14 DIAGNOSIS — Z789 Other specified health status: Secondary | ICD-10-CM

## 2022-10-14 DIAGNOSIS — E871 Hypo-osmolality and hyponatremia: Secondary | ICD-10-CM | POA: Diagnosis present

## 2022-10-14 HISTORY — PX: IR THORACENTESIS ASP PLEURAL SPACE W/IMG GUIDE: IMG5380

## 2022-10-14 LAB — COMPREHENSIVE METABOLIC PANEL
ALT: 33 U/L (ref 0–44)
AST: 36 U/L (ref 15–41)
Albumin: 2.8 g/dL — ABNORMAL LOW (ref 3.5–5.0)
Alkaline Phosphatase: 46 U/L (ref 38–126)
Anion gap: 10 (ref 5–15)
BUN: 16 mg/dL (ref 8–23)
CO2: 24 mmol/L (ref 22–32)
Calcium: 9.4 mg/dL (ref 8.9–10.3)
Chloride: 110 mmol/L (ref 98–111)
Creatinine, Ser: 0.88 mg/dL (ref 0.44–1.00)
GFR, Estimated: >60 mL/min (ref >60)
Glucose, Bld: 108 mg/dL — ABNORMAL HIGH (ref 70–99)
Potassium: 3.6 mmol/L (ref 3.5–5.1)
Sodium: 144 mmol/L (ref 135–145)
Total Bilirubin: 0.7 mg/dL (ref 0.3–1.2)
Total Protein: 6.2 g/dL — ABNORMAL LOW (ref 6.5–8.1)

## 2022-10-14 LAB — URINALYSIS, ROUTINE W REFLEX MICROSCOPIC
Bilirubin Urine: NEGATIVE
Glucose, UA: NEGATIVE mg/dL
Hgb urine dipstick: NEGATIVE
Ketones, ur: NEGATIVE mg/dL
Leukocytes,Ua: NEGATIVE
Nitrite: NEGATIVE
Protein, ur: 30 mg/dL — AB
Specific Gravity, Urine: 1.014 (ref 1.005–1.030)
pH: 5 (ref 5.0–8.0)

## 2022-10-14 LAB — CBC WITH DIFFERENTIAL/PLATELET
Abs Immature Granulocytes: 0.02 10*3/uL (ref 0.00–0.07)
Basophils Absolute: 0 10*3/uL (ref 0.0–0.1)
Basophils Relative: 1 %
Eosinophils Absolute: 0.2 10*3/uL (ref 0.0–0.5)
Eosinophils Relative: 2 %
HCT: 43.5 % (ref 36.0–46.0)
Hemoglobin: 13.9 g/dL (ref 12.0–15.0)
Immature Granulocytes: 0 %
Lymphocytes Relative: 24 %
Lymphs Abs: 1.8 10*3/uL (ref 0.7–4.0)
MCH: 32 pg (ref 26.0–34.0)
MCHC: 32 g/dL (ref 30.0–36.0)
MCV: 100 fL (ref 80.0–100.0)
Monocytes Absolute: 0.6 10*3/uL (ref 0.1–1.0)
Monocytes Relative: 8 %
Neutro Abs: 5 10*3/uL (ref 1.7–7.7)
Neutrophils Relative %: 65 %
Platelets: 233 10*3/uL (ref 150–400)
RBC: 4.35 MIL/uL (ref 3.87–5.11)
RDW: 13.8 % (ref 11.5–15.5)
WBC: 7.7 10*3/uL (ref 4.0–10.5)
nRBC: 0 % (ref 0.0–0.2)

## 2022-10-14 LAB — MAGNESIUM: Magnesium: 2.1 mg/dL (ref 1.7–2.4)

## 2022-10-14 LAB — LACTATE DEHYDROGENASE, PLEURAL OR PERITONEAL FLUID: LD, Fluid: 126 U/L — ABNORMAL HIGH (ref 3–23)

## 2022-10-14 LAB — BODY FLUID CELL COUNT WITH DIFFERENTIAL
Eos, Fluid: 4 %
Lymphs, Fluid: 61 %
Monocyte-Macrophage-Serous Fluid: 25 % — ABNORMAL LOW (ref 50–90)
Neutrophil Count, Fluid: 9 % (ref 0–25)
Other Cells, Fluid: 1 %
Total Nucleated Cell Count, Fluid: 263 cu mm (ref 0–1000)

## 2022-10-14 LAB — BRAIN NATRIURETIC PEPTIDE: B Natriuretic Peptide: 226.1 pg/mL — ABNORMAL HIGH (ref 0.0–100.0)

## 2022-10-14 LAB — GRAM STAIN

## 2022-10-14 LAB — RESP PANEL BY RT-PCR (FLU A&B, COVID) ARPGX2
Influenza A by PCR: NEGATIVE
Influenza B by PCR: NEGATIVE
SARS Coronavirus 2 by RT PCR: NEGATIVE

## 2022-10-14 LAB — TSH: TSH: 2.93 u[IU]/mL (ref 0.350–4.500)

## 2022-10-14 LAB — PROTIME-INR
INR: 1.1 (ref 0.8–1.2)
Prothrombin Time: 14 seconds (ref 11.4–15.2)

## 2022-10-14 LAB — PHOSPHORUS: Phosphorus: 2.2 mg/dL — ABNORMAL LOW (ref 2.5–4.6)

## 2022-10-14 LAB — PROCALCITONIN: Procalcitonin: <0.1 ng/mL

## 2022-10-14 LAB — CK: Total CK: 59 U/L (ref 38–234)

## 2022-10-14 MED ORDER — ACETAMINOPHEN 650 MG RE SUPP: 650.0000 mg | Freq: Four times a day (QID) | RECTAL | Status: DC | PRN

## 2022-10-14 MED ORDER — SODIUM CHLORIDE 0.9 % IV SOLN
500.0000 mg | Freq: Once | INTRAVENOUS | Status: AC
  Administered 2022-10-14: 500 mg via INTRAVENOUS
  Filled 2022-10-14: qty 5

## 2022-10-14 MED ORDER — HYDRALAZINE HCL 20 MG/ML IJ SOLN
10.0000 mg | Freq: Once | INTRAMUSCULAR | Status: AC
  Administered 2022-10-14: 10 mg via INTRAVENOUS
  Filled 2022-10-14: qty 1

## 2022-10-14 MED ORDER — MELATONIN 3 MG PO TABS: 3.0000 mg | ORAL_TABLET | Freq: Every evening | ORAL | Status: DC | PRN

## 2022-10-14 MED ORDER — ACETAMINOPHEN 325 MG PO TABS
650.0000 mg | ORAL_TABLET | Freq: Four times a day (QID) | ORAL | Status: DC | PRN
  Administered 2022-10-18: 650 mg via ORAL
  Filled 2022-10-14: qty 2

## 2022-10-14 MED ORDER — HYDRALAZINE HCL 50 MG PO TABS
75.0000 mg | ORAL_TABLET | Freq: Two times a day (BID) | ORAL | Status: DC
  Administered 2022-10-14 – 2022-10-20 (×11): 75 mg via ORAL
  Filled 2022-10-14 (×5): qty 1
  Filled 2022-10-14: qty 3
  Filled 2022-10-14 (×6): qty 1

## 2022-10-14 MED ORDER — SODIUM CHLORIDE 0.9 % IV SOLN
1.0000 g | Freq: Once | INTRAVENOUS | Status: AC
  Administered 2022-10-14: 1 g via INTRAVENOUS
  Filled 2022-10-14: qty 10

## 2022-10-14 MED ORDER — LATANOPROST 0.005 % OP SOLN
1.0000 [drp] | Freq: Two times a day (BID) | OPHTHALMIC | Status: DC
  Administered 2022-10-14 – 2022-10-20 (×11): 1 [drp] via OPHTHALMIC
  Filled 2022-10-14 (×2): qty 2.5

## 2022-10-14 MED ORDER — SODIUM CHLORIDE 0.9 % IV SOLN
1.0000 g | INTRAVENOUS | Status: DC
  Administered 2022-10-14 – 2022-10-15 (×2): 1 g via INTRAVENOUS
  Filled 2022-10-14 (×2): qty 10

## 2022-10-14 MED ORDER — LIDOCAINE HCL 1 % IJ SOLN
INTRAMUSCULAR | Status: AC
  Administered 2022-10-14: 8 mL
  Filled 2022-10-14: qty 20

## 2022-10-14 MED ORDER — HYDRALAZINE HCL 20 MG/ML IJ SOLN
10.0000 mg | INTRAMUSCULAR | Status: DC | PRN
  Administered 2022-10-15: 10 mg via INTRAVENOUS
  Filled 2022-10-14: qty 1

## 2022-10-14 MED ORDER — DEXTROSE-NACL 5-0.9 % IV SOLN: INTRAVENOUS | Status: AC

## 2022-10-14 MED ORDER — CILOSTAZOL 50 MG PO TABS
50.0000 mg | ORAL_TABLET | Freq: Two times a day (BID) | ORAL | Status: DC
  Administered 2022-10-14 – 2022-10-20 (×11): 50 mg via ORAL
  Filled 2022-10-14 (×14): qty 1

## 2022-10-14 MED ORDER — SODIUM CHLORIDE 0.9 % IV SOLN
500.0000 mg | INTRAVENOUS | Status: DC
  Administered 2022-10-15 (×2): 500 mg via INTRAVENOUS
  Filled 2022-10-14 (×3): qty 5

## 2022-10-14 MED ORDER — EZETIMIBE 10 MG PO TABS
10.0000 mg | ORAL_TABLET | Freq: Every day | ORAL | Status: DC
  Administered 2022-10-14 – 2022-10-20 (×6): 10 mg via ORAL
  Filled 2022-10-14 (×7): qty 1

## 2022-10-14 MED ORDER — METOPROLOL SUCCINATE ER 100 MG PO TB24
100.0000 mg | ORAL_TABLET | Freq: Every day | ORAL | Status: DC
  Administered 2022-10-15 – 2022-10-19 (×5): 100 mg via ORAL
  Filled 2022-10-14 (×6): qty 1

## 2022-10-14 MED ORDER — AMLODIPINE BESYLATE 5 MG PO TABS
5.0000 mg | ORAL_TABLET | Freq: Every evening | ORAL | Status: DC
  Administered 2022-10-15 – 2022-10-19 (×5): 5 mg via ORAL
  Filled 2022-10-14 (×5): qty 1

## 2022-10-14 NOTE — ED Notes (Signed)
Urine culture sent down with urine sample 

## 2022-10-14 NOTE — Progress Notes (Addendum)
Seen and examined this morning. On 2 L nasal cannula saturating 96% BP running high in 170s Hard of hearing, is alert and awake  Did not follow my commands  Patient seen and examined personally, I reviewed the chart, history and physical and admission note, done by admitting physician this morning and agree with the same with following addendum.  Please refer to the morning admission note for more detailed plan of care.  Briefly,  88 year old female with dementia, CKD stage IIIa, GERD, hypertension, NSVT, PAD brought by EMS from home for "stroke", described as increased weakness x1 week, has been having a mental status for 2 days.  At baseline oriented to self but has been less communicative, somnolent than baseline.  Has had recurrent UTI in the past. Last admission:7/10 to 3/15 with metabolic encephalopathy UTI and discharged on home hospice. 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  Has had extensive work-up as above no UTI, question pneumonia versus atelectasis from pleural effusion.  Continue treatment of underlying condition,supportive care.  Large right pleural effusion with extensive right  pulmonary collapse/ possible pneumonia: Placed on antibiotics. Procal is negative however.Will benefit with IR thoracentesis if patient and family agreeable.  Also ordered for cytology fluid analysis Gram stain.  Unclear etiology, BNP 226, no leg edema could from diastolic CHF. Last echo reviewed from May 25, 2022 with EF more than 75%, G1 DD Discussed with daughter and they are agreeable for thoracentesis  Acute hypoxic respiratory failure needing supplemental oxygen initially 80s% saturation on room air.  Continue supplemental oxygen.  Management as above  Hypernatremia-improved Essential hypertension BP borderline controlled continue amlodipine 5 mg.  Metoprolol 100 mg, and hydralazine 75 mg as per daughter.  Dr. Claiborne Billings has been managing hypertension and recently blood pressure medication were adjusted daughter requesting cardiology eval likely tomorrow  Chronic diastolic CHF: Appears euvolemic/dry.  Not on diet attics. Hyperlipidemia : Continue adjust  Goals of care : Currently on home hospice advanced age and multiple comorbidities will benefit palliative care evaluation.  We will prognosis not bright, admitted as a full code.  Called daughter-she  confirmed she is DNR.  Called family to discuss goals of care further plans Contacted [name]-no answer, then called Braulio Conte son in law and discussed: He is going to reach out to his wife who will be calling me I have left my number.  Later [name] called back and agrees for thoracentesis, and request speech eval, and cardio to see sometime.

## 2022-10-14 NOTE — Progress Notes (Signed)
New Admission Note:  Arrival Method: Via stretcher from ED to 41m9. Mental Orientation: Alert & Oriented x0 Telemetry: Verified. Box-573m Assessment: Completed Skin: Refer to flowsheet IV: Right FA Pain: 0/10 Safety Measures: Safety Fall Prevention Plan discussed with patient. Admission: Completed 5 [place] Orientation: Patient has been orientated to the room, unit and the staff.  Orders have been reviewed and are being implemented. Will continue to monitor the patient. Call light has been placed within reach and bed alarm has been activated.   JeVassie MoselleRN  Phone Number: [phone]

## 2022-10-14 NOTE — Progress Notes (Signed)
   This pt is active with [facility] a home based Palliative Care program provided by [facility].   We received a call last evening around 535pm from daughter Dairl Ponder was concerned because the pt's BP was elevated and has been elevated for a week. The MD has made change to her meds. The daughter reports BP was still elevated. Our on call RN offered visit and the daughter refused for our nurse to visit last evening but was in agreement to a visit today. However, we went to call and schedule visit and found the pt to be in hospital.   Please let us know if we can assist in anyway.  Webb Silversmith RN BSN [facility] [phone]

## 2022-10-14 NOTE — Procedures (Signed)
PROCEDURE SUMMARY:  Successful image-guided right thoracentesis. Yielded 1.2 L of hazy amber fluid. Pt tolerated procedure well. No immediate complications. EBL = trace   Specimen was sent for labs. CXR ordered.  Please see imaging section of Epic for full dictation.  Armando Gang Jameisha Stofko PA-C 10/14/2022 12:55 PM

## 2022-10-14 NOTE — ED Notes (Signed)
Pt resting at this time - will defer swallow screen until pt is more awake

## 2022-10-14 NOTE — ED Notes (Signed)
Pt in room Laying down playing with O2 sat and [place]. Is confused in room. Call bell within reach. Room adjusted for comfort. Attempted to feed pt lunch. Pt refused.

## 2022-10-14 NOTE — ED Notes (Addendum)
Attempted to feed patient oatmeal she refused , offered  po fluids patient refused.

## 2022-10-14 NOTE — ED Notes (Signed)
Pt taken to CT.

## 2022-10-14 NOTE — ED Notes (Signed)
Lab to run add on labs

## 2022-10-14 NOTE — ED Notes (Signed)
Md made aware of family concerns. MD talking on the phone with [name] ( Pts daughter)

## 2022-10-14 NOTE — Consult Note (Signed)
Consultation Note Date: 10/14/2022   Patient Name: Julie Bass  DOB: 25-Aug-1926  MRN: [id number]  Age / Sex: 88 y.o., female  PCP: Julie Lung, MD Referring Physician: Antonieta Pert, MD  Reason for Consultation: Establishing goals of care  HPI/Patient Profile: 88 y.o. female  with past medical history of dementia, essential hypertension, hyperlipidemia, chronic diastolic heart failure, and CKD presented to ED on 10/13/22 from home with family concerns of mental status changes. Patient was admitted on 10/13/2022 with acute metabolic encephalopathy, large right pleural effusion with extensive right pulmonary collapse/possible pneumonia, acute hypoxic respiratory failure, hypercalcemia.   Of note, patient was admitted 05/24/22-05/28/22 with acute metabolic encephalopathy and hypertensive urgency - PMT was consulted for GOC at that time and family opted for patient's discharge with hospice services. Per chart review, patient was admitted under hospice services; however, improved to where she was discharged. Patient is now enrolled in outpatient Palliative Care services with [facility].   Clinical Assessment and Goals of Care: I have reviewed medical records including EPIC notes, labs, and imaging. Albumin noted at 2.8 on 12/14/21. Received report from primary RN - no acute concerns.  Documentation shows patient is refusing oral intake.  Went to visit patient at bedside - patient not present. Per RN she was recently taken to IR for thoracentesis - no family/visitors present.   12:41 PM Attempted to call patient's daughter/Julie Bass to discuss diagnosis, prognosis, GOC, EOL wishes, disposition, and options - no answer - confidential voicemail left and PMT phone number provided with request to return call.  1:15 PM Patient returned from IR - assessed at bedside. Patient was lying in bed awake, sleepy, oriented  to self only. No signs or non-verbal gestures of pain or discomfort noted. No respiratory distress, increased work of breathing, or secretions noted. Patient denies pain. She is on 3L O2 [place].  Primary Decision Maker: NEXT OF KIN - seems to be daughter/Julie Bass    SUMMARY OF RECOMMENDATIONS   Unfortunately, unable to reach patient's NOK/daughter/Julie Bass today for GOC - VM left with request to return call Continue DNR/DNI as previously documented Hospice would be reasonable for this patient with multiple comorbidities, including advanced dementia. She is already enrolled in [facility] Care program PMT will continue to follow and support holistically   Code Status/Advance Care Planning: DNR  Palliative Prophylaxis:  Aspiration, Delirium Protocol, Frequent Pain Assessment, Oral Care, and Turn Reposition  Additional Recommendations (Limitations, Scope, Preferences): Full Scope Treatment and No Tracheostomy  Psycho-social/Spiritual:  Desire for further Chaplaincy support:no Created space and opportunity for patient and family to express thoughts and feelings regarding patient's current medical situation.  Emotional support and therapeutic listening provided.  Prognosis:  Poor in the setting of advanced age, advanced dementia, recurrent hospitalizations, and multiple comorbidities  Discharge Planning: To Be Determined      Primary Diagnoses: Present on Admission:  Acute metabolic encephalopathy  CAP (community acquired pneumonia)  Pleural effusion on right  Acute respiratory failure with hypoxia (HCC)  Hypercalcemia  Essential hypertension  HLD (hyperlipidemia)  Chronic diastolic CHF (congestive heart failure) ([place])   I have reviewed the medical record, interviewed the patient and family, and examined the patient. The following aspects are pertinent.  Past Medical History:  Diagnosis Date   Arthritis    Chronic kidney disease    KIDNEY STONES    Dementia (HCC)    BEGINNING STAGES    Depression    GERD (gastroesophageal reflux disease)  Glaucoma    Hiatal hernia    HTN (hypertension) 10/18/2011   Hyperlipemia 10/18/2011   NSVT (nonsustained ventricular tachycardia) ([place]) 10/18/2011   Osteoporosis    PAD (peripheral artery disease) ([place]) 10/18/2011   h/o left SFA stent   Social History   Socioeconomic History   Marital status: Widowed    Spouse name: Not on file   Number of children: 4   Years of education: Not on file   Highest education level: Not on file  Occupational History   Not on file  Tobacco Use   Smoking status: Former    Types: Cigarettes    Quit date: 05/07/1984    Years since quitting: 38.4   Smokeless tobacco: Never  Substance and Sexual Activity   Alcohol use: No   Drug use: No   Sexual activity: Not Currently  Other Topics Concern   Not on file  Social History Narrative   Not on file   Social Determinants of Health   Financial Resource Strain: Not on file  Food Insecurity: Not on file  Transportation Needs: Not on file  Physical Activity: Not on file  Stress: Not on file  Social Connections: Not on file   Family History  Problem Relation Age of Onset   Throat cancer Father    Cancer Mother    CAD Son    Breast cancer Sister        cervical cancer   Colon cancer Brother        throat cancer   Pulmonary embolism Daughter    Hypertension Daughter    Scheduled Meds:  amLODipine  5 mg Oral QPM   cilostazol  50 mg Oral BID WC   ezetimibe  10 mg Oral Daily   hydrALAZINE  75 mg Oral BID   latanoprost  1 drop Both Eyes BID   metoprolol succinate  100 mg Oral Q supper   Continuous Infusions:  azithromycin     cefTRIAXone (ROCEPHIN)  IV     PRN Meds:.acetaminophen **OR** acetaminophen, melatonin Medications Prior to Admission:  Prior to Admission medications   Medication Sig Start Date End Date Taking? Authorizing Provider  amLODipine (NORVASC) 5 MG tablet Take 1 tablet (5 mg  total) by mouth every evening. Keep appointment for further refills. 10/11/22   Troy Sine, MD  Biotin 10000 MCG TABS Take 10,000 mcg by mouth daily with breakfast.    [provider]  cilostazol (PLETAL) 50 MG tablet TAKE 1 TABLET BY MOUTH TWICE A DAY Patient taking differently: Take 50 mg by mouth 2 (two) times daily with a meal. 02/26/22   Croitoru, Mihai, MD  citalopram (CELEXA) 20 MG tablet TAKE 1 TABLET BY MOUTH EVERY DAY 08/06/22   Julie Lung, MD  CRANBERRY PO Take 1 capsule by mouth 2 (two) times daily with a meal.    [provider]  ezetimibe (ZETIA) 10 MG tablet TAKE 1 TABLET BY MOUTH EVERY DAY 09/20/22   Troy Sine, MD  feeding supplement, ENSURE COMPLETE, (ENSURE COMPLETE) LIQD Take 237 mLs by mouth 3 (three) times daily between meals. 08/27/14   Barrett, Evelene Croon, PA-C  hydrALAZINE (APRESOLINE) 25 MG tablet Take 1 tablet (25 mg total) by mouth 3 (three) times daily. 05/28/22 08/26/22  Pokhrel, Corrie Mckusick, MD  latanoprost (XALATAN) 0.005 % ophthalmic solution Place 1 drop into both eyes 2 (two) times daily. 04/28/22   [provider]  metoprolol succinate (TOPROL-XL) 100 MG 24 hr tablet TAKE 1 TABLET BY MOUTH  EVERY EVENING. Patient taking differently: Take 100 mg by mouth daily with supper. 05/12/22   Troy Sine, MD  mirtazapine (REMERON SOL-TAB) 15 MG disintegrating tablet TAKE 1 TABLET BY MOUTH EVERYDAY AT BEDTIME Patient not taking: Reported on 05/25/2022 01/28/22   Julie Lung, MD  omega-3 acid ethyl esters (LOVAZA) 1 g capsule TAKE 1 CAPSULE (1 G TOTAL) BY MOUTH EVERY EVENING. Patient taking differently: Take 1 g by mouth daily with supper. 11/18/21   Troy Sine, MD  pantoprazole (PROTONIX) 40 MG tablet TAKE 1 TABLET (40 MG TOTAL) BY MOUTH DAILY AS NEEDED (FOR ACID REFLUX). Patient taking differently: Take 40 mg by mouth daily with breakfast. 05/12/22   Troy Sine, MD  timolol (TIMOPTIC) 0.5 % ophthalmic solution Place 1 drop into both  eyes 2 (two) times daily.  Patient not taking: Reported on 05/25/2022    [provider]   Allergies  Allergen Reactions   Crestor [Rosuvastatin Calcium] Other (See Comments)    Muscle pain, myalgia   Lipitor [Atorvastatin Calcium] Other (See Comments)    Muscle pain, myalgia   Review of Systems  Unable to perform ROS: Dementia    Physical Exam Vitals and nursing note reviewed.  Constitutional:      General: She is awake. She is not in acute distress.    Appearance: She is ill-appearing.  Pulmonary:     Effort: No respiratory distress.  Skin:    General: Skin is warm and dry.  Neurological:     Mental Status: She is alert. She is disoriented and confused.     Motor: Weakness present.     Comments: sleepy  Psychiatric:        Attention and Perception: She is inattentive.        Speech: Speech is delayed.        Behavior: Behavior is cooperative.        Cognition and Memory: Cognition is impaired. Memory is impaired.     Vital Signs: BP (!) 159/71 (BP Location: Right Arm)   Pulse 71   Temp 97.6 F (36.4 C) (Axillary)   Resp 15   SpO2 95%  Pain Scale: 0-10   Pain Score: Asleep   SpO2: SpO2: 95 % O2 Device:SpO2: 95 % O2 Flow Rate: .O2 Flow Rate (L/min): 3 L/min  IO: Intake/output summary:  Intake/Output Summary (Last 24 hours) at 10/14/2022 1225 Last data filed at 10/14/2022 0215 Gross per 24 hour  Intake 100 ml  Output 350 ml  Net -250 ml    LBM:   Baseline Weight:   Most recent weight:       Palliative Assessment/Data: PPS 10% - refusing oral intake     Time In/Out: 1215-1300 / 4431-5400 Time Total: 50 minutes  Greater than 50%  of this time was spent counseling and coordinating care related to the above assessment and plan.  Signed by: Lin Landsman, NP   Please contact Palliative Medicine Team phone at [phone] for questions and concerns.  For individual provider: See Amion  *Portions of this note are a verbal dictation  therefore any spelling and/or grammatical errors are due to the "[employer]" system interpretation.

## 2022-10-14 NOTE — Progress Notes (Signed)
RN held patient's hydralazine d/t having a pending swallow evaluation. Md Quincy Sheehan) made aware.

## 2022-10-14 NOTE — H&P (Signed)
History and Physical      Julie Bass TDD:[id number] DOB: 10/03/1926 DOA: 10/13/2022  PCP: Denita Lung, MD  Patient coming from: home for continued hemoglobin  I have personally briefly reviewed patient's old medical records in [place]  Chief Complaint: Altered mental status  HPI: Julie Bass is a 88 y.o. female with medical history significant for dementia, essential hypertension, hyperlipidemia, chronic diastolic heart failure, who is admitted to [facility] on 10/13/2022 with suspected acute metabolic encephalopathy after presenting from home to [facility] ED for evaluation of altered mental status.    In the context of the patient's altered mental status, the following history is provided by the patient's son-in-law, who is present at bedside, in addition to my discussions with the EDP and via chart review.  Relative to her baseline dementia, son-in-law conveys that the patient has appeared progressively more confused over the last 2 to 3 days as well as increasingly somnolent over that timeframe.  He is also noted new onset nonproductive cough over the last 2 days.  Patient is without any acute complaints at this time.  No known chronic pulmonary pathology and no known chronic baseline supplemental oxygen requirements.  Medical history notable for chronic diastolic heart failure, with echocardiogram in July 2023 showing grade 1 diastolic dysfunction with LVEF greater than 75%.     ED Course:  Vital signs in the ED were notable for the following: Afebrile; heart rates in the 62B to 76E; systolic blood pressures in the 140s to 150s; respiratory rate 16-18; initial oxygen saturation 88% on room air, symptoms improving into the range of 94 to 95% on 2 to 3 L nasal cannula.  Labs were notable for the following: CMP notable for creatinine 0.94, calcium, adjusted for mild hypoalbuminemia 10.5, avidin 2.8, otherwise liver enzymes within normal limits.  CBC notable for  white cell count 8000.  Urinalysis showed no white blood cells 20: COVID/influenza negative.  Per my interpretation, EKG in ED demonstrated the following: Sinus rhythm with PAC, heart rate 75, nonspecific T wave inversion in aVL, no evidence of ST changes, including no evidence of ST elevation.  Imaging and additional notable ED work-up: CT head, per radiology read showed no evidence of acute intracranial process.  CT chest x-ray, per chart per radiology, and in comparison to plain films the chest from 05/24/2022 showed interval development of moderate right pleural effusion along with right lower lobe airspace opacity, suggestive of atelectasis versus infiltrate.  No evidence of pneumothorax.  While in the ED, the following were administered: Azithromycin, Rocephin.  Subsequently, the patient was admitted for further evaluation management of presenting acute metabolic encephalopathy, with suspicion for community-acquired pneumonia as well as moderate right-sided pleural effusion, with presentation also notable for acute hypoxic respiratory distress and mild hypercalcemia.    Review of Systems: As per HPI otherwise 10 point review of systems negative.   Past Medical History:  Diagnosis Date   Arthritis    Chronic kidney disease    KIDNEY STONES   Dementia ([place])    BEGINNING STAGES    Depression    GERD (gastroesophageal reflux disease)    Glaucoma    Hiatal hernia    HTN (hypertension) 10/18/2011   Hyperlipemia 10/18/2011   NSVT (nonsustained ventricular tachycardia) (HCC) 10/18/2011   Osteoporosis    PAD (peripheral artery disease) ([place]) 10/18/2011   h/o left SFA stent    Past Surgical History:  Procedure Laterality Date   ABDOMINAL HYSTERECTOMY  1973  CARDIAC CATHETERIZATION     2012   CHOLECYSTECTOMY     LOOP RECORDER IMPLANT N/A 08/27/2014   Procedure: LOOP RECORDER IMPLANT;  Surgeon: Coralyn Mark, MD;  Location: [place] [facility];  Service: Cardiovascular;  Laterality: N/A;    LOOP RECORDER REMOVAL N/A 01/09/2018   Procedure: LOOP RECORDER REMOVAL;  Surgeon: Evans Lance, MD;  Location: [place] [facility];  Service: Cardiovascular;  Laterality: N/A;   LOWER EXTREMITY ANGIOGRAM  11/02/2007   stent of mid left SFA with 6x144m EV3 self-expanding stent and 6x3 in prox region (Dr. JAdora Fridge   [place]IAL DOPPLER  2013   right SFA with 50-69% diameter reduction, right PTA/peroneal occluded, L SFA prox to stent has narrowing with increased velocities >60% diameter reduction, L SFA stent patent, L ATA w/occlusive disease, bilat ABIs show mild arterial insuffiency at rest   NM MYOCAR PERF WALL MOTION  10/2011   non-gated - normal stuy, EF 82%, normal LV wall motion   PACEMAKER IMPLANT N/A 01/09/2018   Procedure: PACEMAKER IMPLANT;  Surgeon: TEvans Lance MD;  [facility];  Service: Cardiovascular;  Laterality: N/A;   REVERSE SHOULDER ARTHROPLASTY Right 01/26/2013   Procedure: RIGHT REVERSE TOTAL SHOULDER ARTHROPLASTY;  Surgeon: SAugustin Schooling MD;  [place] [facility] [place]  Service: Orthopedics;  Laterality: Right;   TRANSTHORACIC ECHOCARDIOGRAM  10/2012   EF 75-70%, mod conc hypertrophy, severely calcified MV annulus, LA mildly dailted, PA peak pressure 358mg    Social History:  reports that she quit smoking about 38 years ago. Her smoking use included cigarettes. She has never used smokeless tobacco. She reports that she does not drink alcohol and does not use drugs.   Allergies  Allergen Reactions   Crestor [Rosuvastatin Calcium] Other (See Comments)    Muscle pain, myalgia   Lipitor [Atorvastatin Calcium] Other (See Comments)    Muscle pain, myalgia    Family History  Problem Relation Age of Onset   Throat cancer Father    Cancer Mother    CAD Son    Breast cancer Sister        cervical cancer   Colon cancer Brother        throat cancer   Pulmonary embolism Daughter    Hypertension Daughter      Family history reviewed and not pertinent    Prior to Admission medications   Medication Sig Start Date End Date Taking? Authorizing Provider  amLODipine (NORVASC) 5 MG tablet Take 1 tablet (5 mg total) by mouth every evening. Keep appointment for further refills. 10/11/22   KeTroy SineMD  Biotin 10000 MCG TABS Take 10,000 mcg by mouth daily with breakfast.    [provider]  cilostazol (PLETAL) 50 MG tablet TAKE 1 TABLET BY MOUTH TWICE A DAY Patient taking differently: Take 50 mg by mouth 2 (two) times daily with a meal. 02/26/22   Croitoru, Mihai, MD  citalopram (CELEXA) 20 MG tablet TAKE 1 TABLET BY MOUTH EVERY DAY 08/06/22   LaDenita LungMD  CRANBERRY PO Take 1 capsule by mouth 2 (two) times daily with a meal.    [provider]  ezetimibe (ZETIA) 10 MG tablet TAKE 1 TABLET BY MOUTH EVERY DAY 09/20/22   KeTroy SineMD  feeding supplement, ENSURE COMPLETE, (ENSURE COMPLETE) LIQD Take 237 mLs by mouth 3 (three) times daily between meals. 08/27/14   Barrett, RhEvelene CroonPA-C  hydrALAZINE (APRESOLINE)  25 MG tablet Take 1 tablet (25 mg total) by mouth 3 (three) times daily. 05/28/22 08/26/22  Pokhrel, Corrie Mckusick, MD  latanoprost (XALATAN) 0.005 % ophthalmic solution Place 1 drop into both eyes 2 (two) times daily. 04/28/22   [provider]  metoprolol succinate (TOPROL-XL) 100 MG 24 hr tablet TAKE 1 TABLET BY MOUTH EVERY EVENING. Patient taking differently: Take 100 mg by mouth daily with supper. 05/12/22   Troy Sine, MD  mirtazapine (REMERON SOL-TAB) 15 MG disintegrating tablet TAKE 1 TABLET BY MOUTH EVERYDAY AT BEDTIME Patient not taking: Reported on 05/25/2022 01/28/22   Denita Lung, MD  omega-3 acid ethyl esters (LOVAZA) 1 g capsule TAKE 1 CAPSULE (1 G TOTAL) BY MOUTH EVERY EVENING. Patient taking differently: Take 1 g by mouth daily with supper. 11/18/21   Troy Sine, MD  pantoprazole (PROTONIX) 40 MG tablet TAKE 1 TABLET (40 MG TOTAL) BY  MOUTH DAILY AS NEEDED (FOR ACID REFLUX). Patient taking differently: Take 40 mg by mouth daily with breakfast. 05/12/22   Troy Sine, MD  timolol (TIMOPTIC) 0.5 % ophthalmic solution Place 1 drop into both eyes 2 (two) times daily.  Patient not taking: Reported on 05/25/2022    [provider]     Objective    Physical Exam: Vitals:   10/13/22 1925 10/13/22 2257 10/14/22 0032 10/14/22 0110  BP: (!) 142/104  (!) 179/77 (!) 156/70  Pulse: 82  67 61  Resp: _0 Temp:  (!) 97.5 F (36.4 C)    TempSrc:  Oral    SpO2: 94%  94% 92%    General: appears to be stated age; confused Skin: warm, dry, no rash Head:  AT/ Mouth:  Oral mucosa membranes appear dry, normal dentition Neck: supple; trachea midline Heart:  RRR; did not appreciate any M/R/G Lungs: CTAB, did not appreciate any wheezes, rales, or rhonchi Abdomen: + BS; soft, ND, NT Vascular: 2+ pedal pulses b/l; 2+ radial pulses b/l Extremities: no peripheral edema, no muscle wasting Neuro: In the setting of the patient's current mental status and associated inability to follow instructions, unable to perform full neurologic exam at this time.  As such, assessment of strength, sensation, and cranial nerves is limited at this time. Patient noted to spontaneously move all 4 extremities. No tremors.      Labs on Admission: I have personally reviewed following labs and imaging studies  CBC: Recent Labs  Lab 10/13/22 2145  WBC 8.0  NEUTROABS 5.2  HGB 14.6  HCT 45.0  MCV 98.9  PLT 681   Basic Metabolic Panel: Recent Labs  Lab 10/13/22 2145  NA 146*  K 3.8  CL 110  CO2 25  GLUCOSE 118*  BUN 18  CREATININE 0.94  CALCIUM 9.5   GFR: CrCl cannot be calculated (Unknown ideal weight.). Liver Function Tests: Recent Labs  Lab 10/13/22 2145  AST 34  ALT 33  ALKPHOS 49  BILITOT 0.9  PROT 6.5  ALBUMIN 2.8*   No results for input(s): "LIPASE", "AMYLASE" in the last 168 hours. No results for  input(s): "AMMONIA" in the last 168 hours. Coagulation Profile: No results for input(s): "INR", "PROTIME" in the last 168 hours. Cardiac Enzymes: No results for input(s): "CKTOTAL", "CKMB", "CKMBINDEX", "TROPONINI" in the last 168 hours. BNP (last 3 results) No results for input(s): "PROBNP" in the last 8760 hours. HbA1C: No results for input(s): "HGBA1C" in the last 72 hours. CBG: No results for input(s): "GLUCAP" in the last 168  hours. Lipid Profile: No results for input(s): "CHOL", "HDL", "LDLCALC", "TRIG", "CHOLHDL", "LDLDIRECT" in the last 72 hours. Thyroid Function Tests: No results for input(s): "TSH", "T4TOTAL", "FREET4", "T3FREE", "THYROIDAB" in the last 72 hours. Anemia Panel: No results for input(s): "VITAMINB12", "FOLATE", "FERRITIN", "TIBC", "IRON", "RETICCTPCT" in the last 72 hours. Urine analysis:    Component Value Date/Time   COLORURINE YELLOW 10/14/2022 0014   APPEARANCEUR HAZY (A) 10/14/2022 0014   LABSPEC 1.014 10/14/2022 0014   LABSPEC 1.015 06/12/2020 1456   PHURINE 5.0 10/14/2022 0014   GLUCOSEU NEGATIVE 10/14/2022 0014   HGBUR NEGATIVE 10/14/2022 0014   BILIRUBINUR NEGATIVE 10/14/2022 0014   BILIRUBINUR negative 06/12/2020 1456   BILIRUBINUR n 11/25/2016 1409   KETONESUR NEGATIVE 10/14/2022 0014   PROTEINUR 30 (A) 10/14/2022 0014   UROBILINOGEN negative 11/25/2016 1409   UROBILINOGEN 1 10/08/2014 1529   NITRITE NEGATIVE 10/14/2022 0014   LEUKOCYTESUR NEGATIVE 10/14/2022 0014    Radiological Exams on Admission: CT HEAD WO CONTRAST (5MM)  Result Date: 10/13/2022 CLINICAL DATA:  Increasing weakness for 1 week, dementia, urinary tract infection EXAM: CT HEAD WITHOUT CONTRAST TECHNIQUE: Contiguous axial images were obtained from the base of the skull through the vertex without intravenous contrast. RADIATION DOSE REDUCTION: This exam was performed according to the departmental dose-optimization program which includes automated exposure control, adjustment  of the mA and/or kV according to patient size and/or use of iterative reconstruction technique. COMPARISON:  05/24/2022 FINDINGS: Brain: Diffuse cerebral atrophy. Chronic small-vessel ischemic changes throughout the periventricular white matter. No acute infarct or hemorrhage. Lateral ventricles and midline structures are unremarkable. No acute extra-axial fluid collections. No mass effect. Vascular: Diffuse atherosclerosis.  No hyperdense vessel. Skull: Normal. Negative for fracture or focal lesion. Sinuses/Orbits: No acute finding. Other: None. IMPRESSION: 1. Stable atrophy and chronic small vessel ischemic changes. No acute intracranial process. Electronically Signed   By: Randa Ngo M.D.   On: 10/13/2022 22:22   DG Chest 2 View  Result Date: 10/13/2022 CLINICAL DATA:  Weakness EXAM: CHEST - 2 VIEW COMPARISON:  Radiographs 05/24/2022 FINDINGS: New moderate right pleural effusion and associated atelectasis/consolidation. Left lung is clear. No pneumothorax. Stable cardiomegaly. Aortic atherosclerotic calcification. Left chest wall pacemaker. IMPRESSION: New moderate right pleural effusion and associated atelectasis or pneumonia. Electronically Signed   By: Placido Sou M.D.   On: 10/13/2022 20:24      Assessment/Plan   Principal Problem:   Acute metabolic encephalopathy Active Problems:   HLD (hyperlipidemia)   Essential hypertension   CAP (community acquired pneumonia)   Pleural effusion on right   Acute respiratory failure with hypoxia (HCC)   Hypercalcemia   Chronic diastolic CHF (congestive heart failure) (HCC)      #) Acute metabolic encephalopathy: 2 to 3 days of confusion relative to baseline dementia: Suspect metabolic contribution from multifactorial sources, including suspicion for community-acquired pneumonia given evidence of right lower lobe airspace opacity concerning for infiltrate, along with finding of hypercalcemia.  No overt evidence of additional underlying  infectious process, including urinalysis that is inconsistent with UTI, COVID-19/influenza PCR negative acute CVA appears less likely at this time, while CT head noted to show no evidence of acute process.  Given the long prior smoking history, also check VBG to evaluate for any contribution from hypercapnic encephalopathy.  Will further expand evaluation for additional metabolic contributions, as outlined below.  Plan: Further evaluation and management of suspected community-acquired pneumonia, as further detailed below.  And procalcitonin level.  Fall precautions ordered.  Check CPK, TSH,  VBG, INR.  Repeat CMP/CBC in the morning.          #) Community-acquired pneumonia, suspected on the basis of presenting new onset cough, which is x-ray showing evidence of right lower lobe airspace opacity, concerning for infiltrate.  Of note, in the absence of leukocytosis or objective fever, criteria for sepsis not currently met.  Started on azithromycin and Rocephin in the emergency department prior to collection of blood cultures.  Will further assess with procalcitonin level as well as CT chest, which also the benefit of evaluating for any loculation associated with right pleural effusion.  Plan: Azithromycin, Rocephin, as above.  Add on procalcitonin.  CT chest.  Repeat CBC in the morning.             #) Right pleural effusion: Moderate right-sided pleural effusion, which appears new relative to chest x-ray from July 2023.  Underlying etiology not entirely clear, although differential includes parapneumonic effusion, given suspicion for right lower lobe infiltrate consistent with community-acquired pneumonia.  Will check CT chest to further evaluate for any evidence of loculation, with consideration for subsequent IR consultation for diagnostic/therapeutic thoracentesis.  Does not appear to be overtly volume overloaded at this time, although she does have a history of chronic diastolic heart  failure.  Plan: CT chest, as above.  Add on procalcitonin level.  Monitor strict I's and O's and daily weights.  Check INR.            #) Acute hypoxic respiratory distress: In the absence of any known baseline supplemental oxygen requirements, initial O2 sats in the high 80s on room air, [place] improving into the mid 90s on 2 to 3 L nasal cannula.  Potentially multifactorial in nature, with contributions from interval development of moderate right pleural effusion as well as right lower lobe airspace opacity concerning for infiltrate versus atelectasis.  Clinically, presentation appears less suggestive of acutely decompensated heart failure.  Additionally, presentation appears the stress of a pulmonary embolism.  EKG without any acute ischemic changes, including no evidence of STEMI.  Plan: Further evaluation and management right pleural effusion as well as right lower lobe airspace opacity, as further detailed above, including pursuing CT chest, procalcitonin, and IV antibiotics for now.  Check blood gas.  Monitor continuous pulse oximetry at P.  Monitor strict I's and O's and daily weights.  Add on serum magnesium Check serum phosphorus level.          #) Hypercalcemia, presumed mildly elevated calcium, I suspect patient intravascular depletion.  We will refrain from aggressive IV fluids at this time, given the appearance of moderate right-sided pleural effusion, unclear etiology.  However, patient fluids via IV antibiotics, will closely monitor in setting trending calcium level while expanding laboratory evaluation as further outlined below.  No overt outpatient medications associated with causing hypercalcemia per preliminary review.  Plan: Monitor strict I's and O's and daily weights.  Add on intact PTH, serum magnesium level, phosphorus level.  Repeat CMP in the morning.            #) Essential hypertension: Documented history of such, on hydralazine, Toprol-XL and  Norvasc as an outpatient.  Given suspected underlying infectious process, will gradually reinitiate home blood pressure medications, as below.  Systolic blood pressures been in the 140s to 150s.  Plan: Continue home amlodipine and metoprolol succinate, while holding home dosing for now.  Close monitoring of ensuing blood pressure via routine vital signs.             #)  Hyperlipidemia: Documented history of such, on Zetia as an outpatient.  Plan: Continue home Zetia.               #) Chronic diastolic heart failure: documented history of such, with most recent echocardiogram performed dysfunction.. No overt clinical evidence to suggest acutely decompensated heart failure at this time although moderate right-sided pleural effusion is noted. home diuretic regimen reportedly consists of the following: None.   Plan: monitor strict I's & O's and daily weights. Repeat CMP in AM. Check serum mag level.  CT chest to further characterize moderate pleural effusion.  Add on BNP.       DVT prophylaxis: SCD's   Code Status: Full code (per son-in-law at bedside); this appears different from DNR form on file.  Disposition Plan: Per Rounding Team Consults called: none;  Admission status: inpt     I SPENT GREATER THAN 75  MINUTES IN CLINICAL CARE TIME/MEDICAL DECISION-MAKING IN COMPLETING THIS ADMISSION.      [place] DO [facility]  From [place]   10/14/2022, 1:54 AM

## 2022-10-14 NOTE — Hospital Course (Addendum)
88 year old female with dementia, CKD stage IIIa, GERD, hypertension, NSVT, PAD brought by EMS from home for "stroke", described as increased weakness x1 week, has been having a mental status for 2 days.  At baseline oriented to self but has been less communicative, somnolent than baseline.  Has had recurrent UTI in the past. Last admission:7/10 to 0/30 with metabolic encephalopathy UTI and discharged on home hospice. 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 At this time she is eating some blood pressure stable and doing well on room air. She will be going home w/ [facility] hospice services (new) once it is set up.

## 2022-10-14 NOTE — ED Notes (Signed)
Dr. Rees at bedside  

## 2022-10-14 NOTE — ED Notes (Signed)
Attempted again to assist patient with her breakfast , states she doesn't want any.

## 2022-10-14 NOTE — ED Notes (Signed)
Dr. Howerter at bedside 

## 2022-10-15 ENCOUNTER — Inpatient Hospital Stay (HOSPITAL_COMMUNITY): Payer: Medicare Other

## 2022-10-15 DIAGNOSIS — I340 Nonrheumatic mitral (valve) insufficiency: Secondary | ICD-10-CM

## 2022-10-15 DIAGNOSIS — I5032 Chronic diastolic (congestive) heart failure: Secondary | ICD-10-CM

## 2022-10-15 DIAGNOSIS — Z711 Person with feared health complaint in whom no diagnosis is made: Secondary | ICD-10-CM

## 2022-10-15 DIAGNOSIS — Z7189 Other specified counseling: Secondary | ICD-10-CM

## 2022-10-15 DIAGNOSIS — J189 Pneumonia, unspecified organism: Secondary | ICD-10-CM | POA: Diagnosis not present

## 2022-10-15 DIAGNOSIS — Z515 Encounter for palliative care: Secondary | ICD-10-CM | POA: Diagnosis not present

## 2022-10-15 DIAGNOSIS — G9341 Metabolic encephalopathy: Secondary | ICD-10-CM | POA: Diagnosis not present

## 2022-10-15 DIAGNOSIS — J90 Pleural effusion, not elsewhere classified: Secondary | ICD-10-CM | POA: Diagnosis not present

## 2022-10-15 LAB — ECHOCARDIOGRAM LIMITED
AR max vel: 2.15 cm2
AV Area VTI: 1.95 cm2
AV Area mean vel: 2.18 cm2
AV Mean grad: 9 mmHg
AV Peak grad: 17.1 mmHg
Ao pk vel: 2.07 m/s
Area-P 1/2: 2.73 cm2
MV M vel: 3.45 m/s
MV Peak grad: 47.6 mmHg
P 1/2 time: 305 msec
S' Lateral: 1.4 cm
Weight: 1513.24 oz

## 2022-10-15 LAB — BASIC METABOLIC PANEL
Anion gap: 6 (ref 5–15)
BUN: 12 mg/dL (ref 8–23)
CO2: 23 mmol/L (ref 22–32)
Calcium: 9.2 mg/dL (ref 8.9–10.3)
Chloride: 113 mmol/L — ABNORMAL HIGH (ref 98–111)
Creatinine, Ser: 0.74 mg/dL (ref 0.44–1.00)
GFR, Estimated: >60 mL/min (ref >60)
Glucose, Bld: 122 mg/dL — ABNORMAL HIGH (ref 70–99)
Potassium: 3.4 mmol/L — ABNORMAL LOW (ref 3.5–5.1)
Sodium: 142 mmol/L (ref 135–145)

## 2022-10-15 LAB — BLOOD GAS, VENOUS
Acid-Base Excess: 2.3 mmol/L — ABNORMAL HIGH (ref 0.0–2.0)
Bicarbonate: 26.5 mmol/L (ref 20.0–28.0)
O2 Saturation: 99.3 %
Patient temperature: 37
pCO2, Ven: 39 mmHg — ABNORMAL LOW (ref 44–60)
pH, Ven: 7.44 — ABNORMAL HIGH (ref 7.25–7.43)
pO2, Ven: 104 mmHg — ABNORMAL HIGH (ref 32–45)

## 2022-10-15 LAB — PARATHYROID HORMONE, INTACT (NO CA): PTH: 43 pg/mL (ref 15–65)

## 2022-10-15 LAB — BRAIN NATRIURETIC PEPTIDE: B Natriuretic Peptide: 287.7 pg/mL — ABNORMAL HIGH (ref 0.0–100.0)

## 2022-10-15 MED ORDER — ADULT MULTIVITAMIN W/MINERALS CH
1.0000 | ORAL_TABLET | Freq: Every day | ORAL | Status: DC
  Administered 2022-10-16 – 2022-10-20 (×5): 1 via ORAL
  Filled 2022-10-15 (×5): qty 1

## 2022-10-15 MED ORDER — ENSURE ENLIVE PO LIQD
237.0000 mL | Freq: Two times a day (BID) | ORAL | Status: DC
  Administered 2022-10-15 – 2022-10-20 (×10): 237 mL via ORAL

## 2022-10-15 MED ORDER — POTASSIUM CHLORIDE 10 MEQ/100ML IV SOLN
10.0000 meq | INTRAVENOUS | Status: AC
  Administered 2022-10-15 (×3): 10 meq via INTRAVENOUS
  Filled 2022-10-15 (×3): qty 100

## 2022-10-15 MED ORDER — DEXTROSE-NACL 5-0.9 % IV SOLN: INTRAVENOUS | Status: DC

## 2022-10-15 MED ORDER — POTASSIUM CHLORIDE 10 MEQ/100ML IV SOLN: 10.0000 meq | INTRAVENOUS | Status: DC

## 2022-10-15 NOTE — Progress Notes (Addendum)
Daily Progress Note   Patient Name: Julie Bass       Date: 10/15/2022 DOB: 1926/09/12  Age: 88 y.o. MRN#: [id number] Attending Physician: Antonieta Pert, MD Primary Care Physician: Denita Lung, MD Admit Date: 10/13/2022  Reason for Consultation/Follow-up: Establishing goals of care  Subjective: Chart review performed. Received report from primary RN - no acute concerns. RN states patient remains lethargic, confused. Primary NT states patient was only accepting of one ice cream today.   Went to visit patient at bedside - no family/visitors present. Patient was lying in bed asleep - I did not attempt to wake her. No signs or non-verbal gestures of pain or discomfort noted. No respiratory distress, increased work of breathing, or secretions noted. She is on 2L O2 [place].  4:20 PM Attempted to call daughter/[name] to discuss diagnosis, prognosis, GOC, EOL wishes, disposition, and options - no answer - confidential voicemail left and PMT phone number provided with request to return call.  4:25 PM [name] returned call - she is on the way to hospital. Will meet her at patient's bedside.   4:45 PM Met [name] for [place]. Emotional support provided.  We discussed a brief life review of the patient as well as functional and nutritional status. Patient is not married - [name] is her only surviving child. Prior to hospitalization, patient was living in a private residence with her nephew. Between nephew, daughter/[name], and [name]'s husband, patient has 24/7 supervision/assistance. They pay a private caregiver to bathe patient 2x/week. [name] describes the patient's decline as "sudden" and notes it started about a week ago. Albumin noted at 2.8 on 10/14/22.  We discussed patient's current illness and what it  means in the larger context of patient's on-going co-morbidities. [name] understands that CHF, dementia, CKD are progressive, non-curable disease underlying the patient's current acute medical conditions. 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 We reviewed specific indicators of end stage dementia, including inability to communicate, bed bound/non-ambulatory status, decreased oral intake, swallowing dysfunction, and incontinence of bowel/bladder. Reviewed concern over patient's decreased oral intake - uncertain if intake will improve with interventions vs dementia progression vs natural trajectory at EOL. Counseled that acute events such as what patient has experienced can cause faster progression of chronic diseases. [name] has a clear understanding of patient's  current acute medical situation. Natural disease trajectory and expectations at EOL were discussed. I attempted to elicit values and goals of care important to the patient. The difference between aggressive medical intervention and comfort care was considered in light of the patient's goals of care.   [name] tells me that they would not want patient going to rehab facility. Discussed that the goal of rehab is improvement/stabalization of functional status,  which can be a difficult goal to meet for patients with advanced illness and multiple medical conditions. Reviewed what is needed for someone to have a positive rehabilitation experience to include adequate nutritional intake as well as willingness/ability to participate.  [name] confirms that patient was previously enrolled in hospice, discharged, and now enrolled with [facility] outpatient [facility] Care. [name] feels they did not provide much support. She tells me, "I just want someone that will help."   [name] is familiar with hospice philosophy and is still interested in their services. She is uncertain if she would want to continue hospice with [facility]. She had a previous experience with [facility] and would not want to utilize their organization either. She is open to discussing other hospice options with TOC and can speak to hospice liaisons to get further information on what each can offer. Home vs residential hospice was reviewed - [name] is open to home hospice; however, if patient qualifies, [name] would like to consider residential placement.   Advance directives, concepts specific to code status, artificial feeding and hydration, and rehospitalization were considered and discussed. [name] tells me she is patient's HCPOA - requested copy of documents. DNR/DNI confirmed. [name] tells me they would never want patient admitted to nursing home.  We talked about transition to comfort measures in house and what that would entail inclusive of medications to control pain, dyspnea, agitation, nausea, and itching. We discussed stopping all unnecessary measures such as blood draws, needle sticks, oxygen, antibiotics, CBGs/insulin, cardiac monitoring, IVF, and frequent vital signs. Education provided that other non-pharmacological interventions would be utilized for holistic support and comfort such as spiritual support if requested, repositioning, music therapy, offering comfort feeds, and/or therapeutic listening. At this time, [name] would like to continue current gentle supportive treatment for medical optimization while hospice options are being explored. Reviewed medication list and lab work per her request.  Discussed with [name] the importance of continued conversation with family and the medical providers regarding overall plan of care and treatment options, ensuring decisions are within the context of the patient's values and GOCs.    Questions and concerns were addressed. The patient/family was encouraged to call with questions and/or concerns. PMT card was provided.  Length of Stay: 1  Current Medications: Scheduled Meds:   amLODipine   5 mg Oral QPM   cilostazol  50 mg Oral BID WC   ezetimibe  10 mg Oral Daily   feeding supplement  237 mL Oral BID BM   hydrALAZINE  75 mg Oral BID   latanoprost  1 drop Both Eyes BID   metoprolol succinate  100 mg Oral Q supper   [START ON 10/16/2022] multivitamin with minerals  1 tablet Oral Daily    Continuous Infusions:  azithromycin 500 mg (10/15/22 0015)   cefTRIAXone (ROCEPHIN)  IV 1 g (10/14/22 2256)   dextrose 5 % and 0.9% NaCl      PRN Meds: acetaminophen **OR** acetaminophen, hydrALAZINE, melatonin  Physical Exam Vitals and nursing note reviewed.  Constitutional:      General:  She is not in acute distress. Pulmonary:     Effort: No respiratory distress.  Skin:    General: Skin is warm and dry.  Neurological:     Mental Status: She is lethargic.     Motor: Weakness present.             Vital Signs: BP (!) 176/76 (BP Location: Right Arm)   Pulse 66   Temp 97.6 F (36.4 C) (Oral)   Resp 16   Wt 42.9 kg   SpO2 98%   BMI 16.23 kg/m  SpO2: SpO2: 98 % O2 Device: O2 Device: Nasal Cannula O2 Flow Rate: O2 Flow Rate (L/min): 2 L/min  Intake/output summary:  Intake/Output Summary (Last 24 hours) at 10/15/2022 1528 Last data filed at 10/15/2022 0601 Gross per 24 hour  Intake 906.67 ml  Output --  Net 906.67 ml   LBM:   Baseline Weight: Weight: 42.9 kg Most recent weight: Weight: 42.9 kg       Palliative Assessment/Data: PPS 20%      Patient Active Problem List   Diagnosis Date Noted   CAP (community acquired pneumonia) 10/14/2022   Pleural effusion on right 10/14/2022   Acute respiratory failure with hypoxia ([place]) 10/14/2022   Hypercalcemia 10/14/2022   Chronic diastolic CHF (congestive heart failure) ([place]) 10/14/2022   UTI (urinary tract infection) 05/26/2022   Dementia without behavioral disturbance ([place]) 05/26/2022   Encephalopathy acute 05/25/2022   Submandibular gland mass 15/72/6203   Acute metabolic encephalopathy 55/97/4163   Lactic  acidosis 05/29/2020   Hypertensive urgency 05/29/2020   Hiatal hernia 09/18/2019   Peripheral artery disease ([place]) 09/18/2019   Acute renal failure superimposed on stage 3a chronic kidney disease ([place]) 09/18/2019   Essential hypertension 09/18/2019   Neck mass 09/18/2019   Weakness 09/17/2019   Pacemaker 05/28/2019   Bilateral sensorineural hearing loss 05/09/2018   CHB (complete heart block) ([place]) 01/20/2018   OSA (obstructive sleep apnea) 01/20/2018   Stokes-Adams syncope 01/09/2018   GERD without esophagitis 01/08/2018   CKD (chronic kidney disease), stage III 01/07/2018   Lower urinary tract infectious disease 06/16/2016   [place]'s tumor 01/01/2016   Dementia ([place]) 03/14/2015   Orthostatic hypotension 08/27/2014   Physical deconditioning 08/27/2014   Abdominal pain in female 08/27/2014   Osteoarthrosis, unspecified whether generalized or localized, shoulder region 01/26/2013   Chest wall pain 12/26/2012   NSVT (nonsustained ventricular tachycardia) ([place]) 10/18/2011   HTN (hypertension) 10/18/2011   HLD (hyperlipidemia) 10/18/2011   PAD (peripheral artery disease) ([place]) 10/18/2011   Syncope 09/30/2011   Pelvic joint pain 09/30/2011   Back pain 09/30/2011    Palliative Care Assessment & Plan   Patient Profile: 87 y.o. female  with past medical history of dementia, essential hypertension, hyperlipidemia, chronic diastolic heart failure, and CKD presented to ED on 10/13/22 from home with family concerns of mental status changes. Patient was admitted on 10/13/2022 with acute metabolic encephalopathy, large right pleural effusion with extensive right pulmonary collapse/possible pneumonia, acute hypoxic respiratory failure, hypercalcemia.    Of note, patient was admitted 05/24/22-05/28/22 with acute metabolic encephalopathy and hypertensive urgency - PMT was consulted for GOC at that time and family opted for patient's discharge with hospice services. Per chart review, patient was  admitted under hospice services; however, improved to where she was discharged. Patient is now enrolled in outpatient Palliative Care services with [facility].   Assessment: Principal Problem:   Acute metabolic encephalopathy Active Problems:   HLD (hyperlipidemia)   Essential  hypertension   CAP (community acquired pneumonia)   Pleural effusion on right   Acute respiratory failure with hypoxia ([facility])   Hypercalcemia   Chronic diastolic CHF (congestive heart failure) ([place])   Concern about end of life  Recommendations/Plan: Continue gentle supportive treatment for medical optimization DNR/DNI confirmed Daughter interested in hospice services at discharge - would like residential evaluation but is also open to home hospice option. TOC consult placed If able, obtain copy of HCPOA PMT will continue to follow and support holistically  Goals of Care and Additional Recommendations: Limitations on Scope of Treatment: Full Scope Treatment and No Tracheostomy  Code Status:    Code Status Orders  (From admission, onward)           Start     Ordered   10/14/22 1054  Do not attempt resuscitation (DNR)  Continuous       Question Answer Comment  In the event of cardiac or respiratory ARREST Do not call a "code blue"   In the event of cardiac or respiratory ARREST Do not perform Intubation, CPR, defibrillation or ACLS   In the event of cardiac or respiratory ARREST Use medication by any route, position, wound care, and other measures to relive pain and suffering. May use oxygen, suction and manual treatment of airway obstruction as needed for comfort.      10/14/22 1053           Code Status History     Date Active Date Inactive Code Status Order ID Comments User Context   10/14/2022 0151 10/14/2022 1053 Full Code [id number]  Rhetta Mura, DO ED   05/25/2022 0634 05/28/2022 1709 DNR [id number]  Clance Boll, MD ED   05/29/2020 0659 06/01/2020 2013 DNR [id number]   Vernelle Emerald, MD ED   09/17/2019 0318 09/18/2019 2034 Full Code [id number]  Jani Gravel, MD ED   01/08/2018 0011 01/10/2018 2024 Full Code [id number]  Ivor Costa, MD ED   06/16/2016 1755 06/18/2016 2137 Full Code [id number]  Cristal Ford, DO Inpatient   08/23/2014 1950 08/27/2014 2150 Full Code [id number]  Almyra Deforest, [place] Inpatient   01/26/2013 1250 01/28/2013 1643 Full Code [id number]  Augustin Schooling, MD Inpatient   10/18/2011 2201 10/20/2011 2046 Full Code [id number]  Cecilie Kicks, NP Inpatient       Prognosis: Poor in the setting of advanced age, advanced dementia, recurrent hospitalizations, poor oral intake, and multiple comorbidities  Discharge Planning: Home hospice vs residential hospice  Care plan was discussed with primary RN, patient's daughter  Thank you for allowing the Palliative Medicine Team to assist in the care of this patient.   Total Time 90 minutes Prolonged Time Billed  yes       Greater than 50%  of this time was spent counseling and coordinating care related to the above assessment and plan.  Lin Landsman, NP  Please contact Palliative Medicine Team phone at [phone] for questions and concerns.   *Portions of this note are a verbal dictation therefore any spelling and/or grammatical errors are due to the "[place] [employer]" system interpretation.

## 2022-10-15 NOTE — Progress Notes (Signed)
  Echocardiogram 2D Echocardiogram has been performed.  Wynelle Link 10/15/2022, 10:09 AM

## 2022-10-15 NOTE — Progress Notes (Signed)
SLP Cancellation Note  Patient Details Name: Julie Bass MRN: [id number] DOB: 12-Dec-1925   Cancelled treatment:       Reason Eval/Treat Not Completed: Fatigue/lethargy limiting ability to participate. Pt was too lethargic to participate in swallow eval at this time, but family present shares that the only concern for swallowing at home is that she pockets meats. They no longer give her meats, but they do give her any other soft foods (such as mac & cheese) and just monitor for oral clearance. They deny any coughing with PO intake, symptoms of reflux, or any prior PNA. Per their request, diet has been changed to pureed foods for now and RN will reach out to SLP if pt becomes adequately alert later today. Will f/u as able.    Osie Bond., M.A. [place] Office [phone]  Secure chat preferred  10/15/2022, 10:33 AM

## 2022-10-15 NOTE — Progress Notes (Signed)
[facility] night cross cover note:   I was notified by patient's RN that the patient has failed her nursing swallow screen. She is now currently strict n.p.o., with order placed for SLP consult for formal swallow evaluation.  She was due this evening for scheduled dose of p.o. hydralazine.  Most recent blood pressure 158/77.  I subsequently placed order for one-time dose hydralazine 10 mg IV x1, as well as order for prn IV hydralazine for systolic blood pressures greater than 180 mmHg.      Babs Bertin, DO Hospitalist

## 2022-10-15 NOTE — Consult Note (Addendum)
Cardiology Consultation   Patient ID: Julie Bass MRN: [id number]; DOB: 04-Jan-1926  Admit date: 10/13/2022 Date of Consult: 10/15/2022  PCP:  Denita Lung, MD   [place] Prov[facility] Cardiologist:  Shelva Majestic, MD        Patient Profile:   Julie Bass is a 88 y.o. female with a hx of CKD stage III, sinus arrest s/p Biotronic Endora DR-T placed 2019, dementia, GERD, hypertension, NSVT, PAD, glaucoma who is being seen 10/15/2022 in the setting of acute metabolic encephalopathy, hypoxic respiratory failure, large right pleural effusion at the request of Dr. Lupita Leash and patient's family.  History of Present Illness:   Julie Bass is a 88 year old female with above-noted medical history who was brought to [facility] emergency department on 11/30 for evaluation of altered mental status.  In the ED, HPI provided by patient's son-in-law due to acute altered mental status.  Patient's son-in-law reported that the patient had appeared progressively more confused over the past 2 to 3 days with concurrent somnolence.  Patient also had symptoms of a nonproductive cough.  In the ED patient found mildly hypoxic with an O2 saturation of 88% on room air.  Initial labs were notable for white blood cell count of 8000, negative COVID/influenza.  Albumin low at 2.8.  BNP 226.1.  CT head showed stable atrophy and chronic small vessel ischemic changes without acute intracranial process.  Chest x-ray with new moderate right pleural effusion and associated atelectasis/consolidation.  Chest CT with large low-density right pleural effusion with extensive right pulmonary collapse.  Patient underwent ultrasound-guided thoracentesis by interventional radiology yesterday with approximately 1.2 L of hazy amber fluid removed.  Repeat chest x-ray today shows near full resolution of right-sided pleural effusion.   Past Medical History:  Diagnosis Date   Arthritis    Chronic kidney disease    KIDNEY  STONES   Dementia ([place])    BEGINNING STAGES    Depression    GERD (gastroesophageal reflux disease)    Glaucoma    Hiatal hernia    HTN (hypertension) 10/18/2011   Hyperlipemia 10/18/2011   NSVT (nonsustained ventricular tachycardia) ([facility]) 10/18/2011   Osteoporosis    PAD (peripheral artery disease) ([place]) 10/18/2011   h/o left SFA stent    Past Surgical History:  Procedure Laterality Date   ABDOMINAL HYSTERECTOMY  1973   CARDIAC CATHETERIZATION     2012   CHOLECYSTECTOMY     IR THORACENTESIS ASP PLEURAL SPACE W/IMG GUIDE  10/14/2022   LOOP RECORDER IMPLANT N/A 08/27/2014   Procedure: LOOP RECORDER IMPLANT;  Surgeon: Coralyn Mark, MD;  Location: [facility] C[facility];  Service: Cardiovascular;  Laterality: N/A;   LOOP RECORDER REMOVAL N/A 01/09/2018   Procedure: LOOP RECORDER REMOVAL;  Surgeon: Evans Lance, MD;  Location: [facility] CV [facility];  Service: Cardiovascular;  Laterality: N/A;   LOWER EXTREMITY ANGIOGRAM  11/02/2007   stent of mid left SFA with 6x135m EV3 self-expanding stent and 6x3 in prox region (Dr. JAdora Fridge   [place]  [place]   right SFA with 50-69% diameter reduction, right PTA/peroneal occluded, L SFA prox to stent has narrowing with increased velocities >60% diameter reduction, L SFA stent patent, L ATA w/occlusive disease, bilat ABIs show mild arterial insuffiency at rest   NM MYOCAR PERF WALL MOTION  10/2011   non-gated - normal stuy, EF 82%, normal LV wall motion   PACEMAKER IMPLANT N/A 01/09/2018  Procedure: PACEMAKER IMPLANT;  Surgeon: Evans Lance, MD;  Location: [facility] CV [facility];  Service: Cardiovascular;  Laterality: N/A;   REVERSE SHOULDER ARTHROPLASTY Right 01/26/2013   Procedure: RIGHT REVERSE TOTAL SHOULDER ARTHROPLASTY;  Surgeon: Augustin Schooling, MD;  Location: [facility];  Service: Orthopedics;  Laterality: Right;   TRANSTHORACIC ECHOCARDIOGRAM  10/2012   EF 75-70%, mod conc hypertrophy, severely calcified  MV annulus, LA mildly dailted, PA peak pressure 2mHg     Home Medications:  Prior to Admission medications   Medication Sig Start Date End Date Taking? Authorizing Provider  amLODipine (NORVASC) 5 MG tablet Take 1 tablet (5 mg total) by mouth every evening. Keep appointment for further refills. Patient taking differently: Take 5 mg by mouth every evening. 10/11/22  Yes KTroy Sine MD  Biotin 10000 MCG TABS Take 10,000 mcg by mouth daily with breakfast.   Yes [provider]  cilostazol (PLETAL) 50 MG tablet TAKE 1 TABLET BY MOUTH TWICE A DAY Patient taking differently: Take 50 mg by mouth 2 (two) times daily with a meal. 02/26/22  Yes Croitoru, Mihai, MD  citalopram (CELEXA) 20 MG tablet TAKE 1 TABLET BY MOUTH EVERY DAY Patient taking differently: Take 20 mg by mouth daily. 08/06/22  Yes LDenita Lung MD  CRANBERRY PO Take 1 capsule by mouth 2 (two) times daily with a meal.   Yes [provider]  dorzolamide-timolol (COSOPT) 2-0.5 % ophthalmic solution Place 1 drop into both eyes 2 (two) times daily. 10/05/22  Yes [provider]  ezetimibe (ZETIA) 10 MG tablet TAKE 1 TABLET BY MOUTH EVERY DAY Patient taking differently: Take 10 mg by mouth daily. 09/20/22  Yes KTroy Sine MD  feeding supplement, ENSURE COMPLETE, (ENSURE COMPLETE) LIQD Take 237 mLs by mouth 3 (three) times daily between meals. 08/27/14  Yes Barrett, REvelene Croon PA-C  hydrALAZINE (APRESOLINE) 25 MG tablet Take 1 tablet (25 mg total) by mouth 3 (three) times daily. 05/28/22 10/14/22 Yes Pokhrel, Laxman, MD  latanoprost (XALATAN) 0.005 % ophthalmic solution Place 1 drop into both eyes daily. 04/28/22  Yes [provider]  LIVALO 2 MG TABS Take 1 tablet by mouth daily. 08/05/22  Yes [provider]  metoprolol succinate (TOPROL-XL) 100 MG 24 hr tablet TAKE 1 TABLET BY MOUTH EVERY EVENING. Patient taking differently: Take 100 mg by mouth daily with supper. 05/12/22  Yes KTroy Sine MD  mirtazapine (REMERON) 15 MG tablet Take 15 mg by mouth at bedtime. 09/16/22  Yes [provider]  omega-3 acid ethyl esters (LOVAZA) 1 g capsule TAKE 1 CAPSULE (1 G TOTAL) BY MOUTH EVERY EVENING. Patient taking differently: Take 1 g by mouth daily with supper. 11/18/21  Yes KTroy Sine MD  pantoprazole (PROTONIX) 40 MG tablet TAKE 1 TABLET (40 MG TOTAL) BY MOUTH DAILY AS NEEDED (FOR ACID REFLUX). Patient taking differently: Take 40 mg by mouth daily as needed (For acid reflux). 05/12/22  Yes KTroy Sine MD  mirtazapine (REMERON SOL-TAB) 15 MG disintegrating tablet TAKE 1 TABLET BY MOUTH EVERYDAY AT BEDTIME Patient not taking: Reported on 05/25/2022 01/28/22   LDenita Lung MD    Inpatient Medications: Scheduled Meds:  amLODipine  5 mg Oral QPM   cilostazol  50 mg Oral BID WC   ezetimibe  10 mg Oral Daily   feeding supplement  237 mL Oral BID BM   hydrALAZINE  75 mg Oral BID   latanoprost  1 drop Both Eyes BID  metoprolol succinate  100 mg Oral Q supper   Continuous Infusions:  azithromycin 500 mg (10/15/22 0015)   cefTRIAXone (ROCEPHIN)  IV 1 g (10/14/22 2256)   PRN Meds: acetaminophen **OR** acetaminophen, hydrALAZINE, melatonin  Allergies:    Allergies  Allergen Reactions   Crestor [Rosuvastatin Calcium] Other (See Comments)    Muscle pain, myalgia   Lipitor [Atorvastatin Calcium] Other (See Comments)    Muscle pain, myalgia    Social History:   Social History   Socioeconomic History   Marital status: Widowed    Spouse name: Not on file   Number of children: 4   Years of education: Not on file   Highest education level: Not on file  Occupational History   Not on file  Tobacco Use   Smoking status: Former    Types: Cigarettes    Quit date: 05/07/1984    Years since quitting: 38.4   Smokeless tobacco: Never  Substance and Sexual Activity   Alcohol use: No   Drug use: No   Sexual activity: Not Currently  Other Topics Concern   Not  on file  Social History Narrative   Not on file   Social Determinants of Health   Financial Resource Strain: Not on file  Food Insecurity: Not on file  Transportation Needs: Not on file  Physical Activity: Not on file  Stress: Not on file  Social Connections: Not on file  Intimate Partner Violence: Not on file    Family History:    Family History  Problem Relation Age of Onset   Throat cancer Father    Cancer Mother    CAD Son    Breast cancer Sister        cervical cancer   Colon cancer Brother        throat cancer   Pulmonary embolism Daughter    Hypertension Daughter      ROS:  Please see the history of present illness.   All other ROS reviewed and negative.     Physical Exam/Data:   Vitals:   10/14/22 2109 10/15/22 0109 10/15/22 0504 10/15/22 0912  BP: (!) 158/77 (!) 143/66 (!) 152/63 (!) 176/76  Pulse: 74 75 71 66  Resp: '19 18 16   '$ Temp: 97.9 F (36.6 C) 98 F (36.7 C) 98.6 F (37 C) 97.6 F (36.4 C)  TempSrc: Axillary Oral Oral Oral  SpO2: 98% 96% 97% 98%  Weight: 42.9 kg       Intake/Output Summary (Last 24 hours) at 10/15/2022 1022 Last data filed at 10/15/2022 0601 Gross per 24 hour  Intake 906.67 ml  Output --  Net 906.67 ml      10/14/2022    9:09 PM 05/03/2022    2:52 PM 03/23/2021    9:23 PM  Last 3 Weights  Weight (lbs) 94 lb 9.2 oz 102 lb 3.2 oz 107 lb 5.8 oz  Weight (kg) 42.9 kg 46.358 kg 48.7 kg     Body mass index is 16.23 kg/m.  General:  No acute distress, under-nourished appearing. Pleasantly confused on exam HEENT: normal Neck: no JVD Vascular: No carotid bruits; Distal pulses 2+ bilaterally Cardiac:  normal S1, S2; RRR; loud systolic murmur over apex Lungs:  clear to auscultation bilaterally, no wheezing, rhonchi or rales  Abd: soft, nontender, no hepatomegaly  Ext: no edema Musculoskeletal:  No deformities, BUE and BLE strength normal and equal Skin: warm and dry  Neuro/Psych:  Patient pleasantly confused. Unable to  fully assess cranial nerve function,  no acute deficits appreciated.  EKG:  The EKG from 11/30 was personally reviewed and demonstrates: Sinus rhythm with premature atrial contractions.  No acute ischemic changes. Telemetry:  Telemetry was personally reviewed and demonstrates:  sinus rhythm with occasional atrial pacing. Even more rare ventricular pacing.  Relevant CV Studies:  05/25/2022 TTE  IMPRESSIONS     1. Left ventricular ejection fraction, by estimation, is >75%. The left  ventricle has hyperdynamic function. The left ventricle has no regional  wall motion abnormalities. There is mild concentric left ventricular  hypertrophy. Left ventricular diastolic  parameters are consistent with Grade I diastolic dysfunction (impaired  relaxation). Elevated left atrial pressure.   2. Right ventricular systolic function is normal. The right ventricular  size is normal. Tricuspid regurgitation signal is inadequate for assessing  PA pressure.   3. The mitral valve is degenerative. No evidence of mitral valve  regurgitation. Mild mitral stenosis. The mean mitral valve gradient is 4.0  mmHg with average heart rate of 80 bpm. Moderate to severe mitral annular  calcification.   4. The aortic valve is tricuspid. There is moderate calcification of the  aortic valve. There is moderate thickening of the aortic valve. Aortic  valve regurgitation is not visualized.   FINDINGS   Left Ventricle: Left ventricular ejection fraction, by estimation, is  >75%. The left ventricle has hyperdynamic function. The left ventricle has  no regional wall motion abnormalities. The left ventricular internal  cavity size was normal in size. There  is mild concentric left ventricular hypertrophy. Left ventricular  diastolic parameters are consistent with Grade I diastolic dysfunction  (impaired relaxation). Elevated left atrial pressure.   Right Ventricle: The right ventricular size is normal. No increase in  right  ventricular wall thickness. Right ventricular systolic function is  normal. Tricuspid regurgitation signal is inadequate for assessing PA  pressure.   Left Atrium: Left atrial size was normal in size.   Right Atrium: Right atrial size was normal in size.   Pericardium: There is no evidence of pericardial effusion.   Mitral Valve: The mitral valve is degenerative in appearance. Moderate to  severe mitral annular calcification. No evidence of mitral valve  regurgitation. Mild mitral valve stenosis. MV peak gradient, 8.0 mmHg. The  mean mitral valve gradient is 4.0 mmHg  with average heart rate of 80 bpm.   Tricuspid Valve: The tricuspid valve is normal in structure. Tricuspid  valve regurgitation is not demonstrated.   Aortic Valve: The aortic valve is tricuspid. There is moderate  calcification of the aortic valve. There is moderate thickening of the  aortic valve. Aortic valve regurgitation is not visualized.   Pulmonic Valve: The pulmonic valve was not well visualized. Pulmonic valve  regurgitation is not visualized.   Aorta: The aortic root and ascending aorta are structurally normal, with  no evidence of dilitation.   IAS/Shunts: The interatrial septum was not well visualized.   Laboratory Data:  High Sensitivity Troponin:  No results for input(s): "TROPONINIHS" in the last 720 hours.   Chemistry Recent Labs  Lab 10/13/22 2145 10/14/22 0410 10/15/22 0814  NA 146* 144 142  K 3.8 3.6 3.4*  CL 110 110 113*  CO2 '25 24 23  '$ GLUCOSE 118* 108* 122*  BUN '18 16 12  '$ CREATININE 0.94 0.88 0.74  CALCIUM 9.5 9.4 9.2  MG  --  2.1  --   GFRNONAA 56* >60 >60  ANIONGAP '11 10 6    '$ Recent Labs  Lab 10/13/22 2145 10/14/22 0410  PROT 6.5 6.2*  ALBUMIN 2.8* 2.8*  AST 34 36  ALT 33 33  ALKPHOS 49 46  BILITOT 0.9 0.7   Lipids No results for input(s): "CHOL", "TRIG", "HDL", "LABVLDL", "LDLCALC", "CHOLHDL" in the last 168 hours.  Hematology Recent Labs  Lab 10/13/22 2145  10/14/22 0410  WBC 8.0 7.7  RBC 4.55 4.35  HGB 14.6 13.9  HCT 45.0 43.5  MCV 98.9 100.0  MCH 32.1 32.0  MCHC 32.4 32.0  RDW 13.9 13.8  PLT 237 233   Thyroid  Recent Labs  Lab 10/14/22 0650  TSH 2.930    BNP Recent Labs  Lab 10/14/22 0410 10/15/22 0814  BNP 226.1* 287.7*    DDimer No results for input(s): "DDIMER" in the last 168 hours.   Radiology/Studies:  IR THORACENTESIS ASP PLEURAL SPACE W/IMG GUIDE  Result Date: 10/14/2022 INDICATION: Patient with history of CKD presents with shortness of breath, previous CT showed right pleural effusion. Request for therapeutic and diagnostic thoracentesis. EXAM: ULTRASOUND GUIDED RIGHT THORACENTESIS MEDICATIONS: 5 mL 1% lidocaine COMPLICATIONS: None immediate. PROCEDURE: An ultrasound guided thoracentesis was thoroughly discussed with the patient's daughter and questions answered. The benefits, risks, alternatives and complications were also discussed. The patient's daughter understands and wishes to proceed with the procedure. Written consent was obtained. Ultrasound was performed to localize and mark an adequate pocket of fluid in the right chest. The area was then prepped and draped in the normal sterile fashion. 1% Lidocaine was used for local anesthesia. Under ultrasound guidance a 6 Fr Safe-T-Centesis catheter was introduced. Thoracentesis was performed. The catheter was removed and a dressing applied. FINDINGS: A total of approximately 1.2 L of hazy amber fluid was removed. Samples were sent to the laboratory as requested by the clinical team. Post procedure chest X-ray reviewed, negative for pneumothorax. IMPRESSION: Successful ultrasound guided right thoracentesis yielding 1.2 L of pleural fluid. Read by: Durenda Guthrie, PA-C Electronically Signed   By: Jerilynn Mages.  Shick M.D.   On: 10/14/2022 15:34   DG Chest 1 View  Result Date: 10/14/2022 CLINICAL DATA:  Status post right thoracentesis. EXAM: CHEST  1 VIEW COMPARISON:  October 13, 2022.  FINDINGS: No pneumothorax status post right-sided thoracentesis. Right pleural effusion is nearly resolved. IMPRESSION: No pneumothorax status post right-sided thoracentesis. Electronically Signed   By: Marijo Conception M.D.   On: 10/14/2022 13:02   CT CHEST WO CONTRAST  Result Date: 10/14/2022 CLINICAL DATA:  Respiratory illness with nondiagnostic x-ray EXAM: CT CHEST WITHOUT CONTRAST TECHNIQUE: Multidetector CT imaging of the chest was performed following the standard protocol without IV contrast. RADIATION DOSE REDUCTION: This exam was performed according to the departmental dose-optimization program which includes automated exposure control, adjustment of the mA and/or kV according to patient size and/or use of iterative reconstruction technique. COMPARISON:  None Available. FINDINGS: Cardiovascular: Generous heart size. Dual-chamber pacer leads from the left. No pericardial effusion. Generalized atheromatous calcification. Mediastinum/Nodes: Negative for adenopathy or mass. Lungs/Pleura: Large right pleural effusion with collapse of the lower lobe and extensive upper lobe atelectasis. Collapse of left lower lobe airways but aerated left lung without consolidation or edema. No pneumothorax. Upper Abdomen: Cyst with layering numerous calculi at the upper pole left kidney, cyst measuring up to 5 cm. Cholecystectomy and atherosclerosis. Musculoskeletal: Compression fractures of T3, T6, T9, T12, L1, and L2. The most recent appearing is at T12 where there is sclerosis and some visible fracture plane. Moderate to advanced height loss at T9, T12, L1, and L2. no retropulsion of note.  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 IMPRESSION: 1. Large low-density right pleural effusion with extensive right pulmonary collapse. 2. Multiple thoracic and lumbar compression fractures with subacute appearance at T12. 3. 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 Atherosclerosis and multiple left renal calculi. Electronically Signed   By:  Jorje Guild M.D.   On: 10/14/2022 07:34   CT HEAD WO CONTRAST (5MM)  Result Date: 10/13/2022 CLINICAL DATA:  Increasing weakness for 1 week, dementia, urinary tract infection EXAM: CT HEAD WITHOUT CONTRAST TECHNIQUE: Contiguous axial images were obtained from the base of the skull through the vertex without intravenous contrast. RADIATION DOSE REDUCTION: This exam was performed according to the departmental dose-optimization program which includes automated exposure control, adjustment of the mA and/or kV according to patient size and/or use of iterative reconstruction technique. COMPARISON:  05/24/2022 FINDINGS: Brain: Diffuse cerebral atrophy. Chronic small-vessel ischemic changes throughout the periventricular white matter. No acute infarct or hemorrhage. Lateral ventricles and midline structures are unremarkable. No acute extra-axial fluid collections. No mass effect. Vascular: Diffuse atherosclerosis.  No hyperdense vessel. Skull: Normal. Negative for fracture or focal lesion. Sinuses/Orbits: No acute finding. Other: None. IMPRESSION: 1. Stable atrophy and chronic small vessel ischemic changes. No acute intracranial process. Electronically Signed   By: Randa Ngo M.D.   On: 10/13/2022 22:22   DG Chest 2 View  Result Date: 10/13/2022 CLINICAL DATA:  Weakness EXAM: CHEST - 2 VIEW COMPARISON:  Radiographs 05/24/2022 FINDINGS: New moderate right pleural effusion and associated atelectasis/consolidation. Left lung is clear. No pneumothorax. Stable cardiomegaly. Aortic atherosclerotic calcification. Left chest wall pacemaker. IMPRESSION: New moderate right pleural effusion and associated atelectasis or pneumonia. Electronically Signed   By: Placido Sou M.D.   On: 10/13/2022 20:24     Assessment and Plan:  Julie Bass is a 88 y.o. female with a hx of CKD stage III, sinus arrest s/p Biotronic Endora DR-T placed 2019, dementia, GERD, hypertension, NSVT, PAD, glaucoma who is being seen  10/15/2022 in the setting of acute metabolic encephalopathy, hypoxic respiratory failure, large right pleural effusion at the request of Dr. Lupita Leash and patient's family.   Large right pleural effusion with extensive right pulmonary collapse/possible pneumonia (resolved) Acute hypoxic respiratory failure  Patient s/p thoracentesis with 1.2L amber colored fluid removed. Pleural fluid LDH 126, no serum LDH or pleural fluid protein available to assess Light's criteria but likely transudative. Gram stain without bacteria.  Source of pleural effusion unclear. BNP 287.7 today. Historically patient with grade I diastolic dysfunction and mod-severe MR but no overt HF hx. Limited echo has been completed today, final read pending. No indication for IV diuresis at this time. Likely multifaceted etiology including malnourishment with decreased total protein and albumin, moderate-severe MR, diastolic dysfunction. Given patient's frailty, would recommend conservative approach. Patient seems much improved following thoracentesis, suspect this was the primary source of her dyspnea on admission. Would continue to engage with palliative care and work to simplify medication regimen.  Acute Metabolic encephalopathy   Patient with some dementia at baseline. No obvious infectious etiology of acute encephalopathy. Continue workup/management per primary team.  Hypertension  Patient historically followed by Dr. Claiborne Billings, last seen October 2021. Home regimen appears to be Metoprolol Succinate '100mg'$  QD, Amlodipine '5mg'$  QD, Hydralazine '25mg'$  TID.   Patient with hypertension this admission, likely 2/2 limited oral intake status/missed home med doses with somnolence/dysphagia.  Agree with PRN IV hydralazine. Resume home meds as able with dysphagia.   PAD Hyperlipidemia  Patient on Zetia '10mg'$ , Pletal '50mg'$  BID.  Given recent palliative/hospice status, would consider eliminating to simplify med regimen.     Risk Assessment/Risk  Scores:                For questions or updates, please contact [facility] Please consult [email or web address] for contact info under    Signed, Lily Kocher, PA-C  10/15/2022 10:22 AM  Agree with note by Lily Kocher, PA-C  We are asked to see Julie Bass for respiratory failure.  Her daughter [name] works in our office.  She does have a history of hypertension.  On admission she was noted to have a large right pleural effusion and underwent thoracentesis of 1.2 L of fluid.  She feels clinically improved.  She does have advanced dementia and is hard to effectively communicate with her.  She has been in hospice care in the past.  She does have a loud apical systolic murmur consistent with mitral regurgitation.  Limited 2D echo is pending.  I do not think she needs further evaluation.  I think Abilify her medical regimen is a great idea.  Would consider a palliative care consult.  No further evaluation is required at this time.  Lorretta Harp, M.D., [place], [facility], [place] [address] [place], [place]  [id number]  [phone] 10/15/2022 11:54 AM

## 2022-10-15 NOTE — Progress Notes (Signed)
PROGRESS NOTE Julie Bass  XBM:[id number] DOB: Feb 08, 1926 DOA: 10/13/2022 PCP: Denita Lung, MD   Brief Narrative/Hospital Course: 88 year old female with dementia, CKD stage IIIa, GERD, hypertension, NSVT, PAD brought by EMS from home for "stroke", described as increased weakness x1 week, has been having a mental status for 2 days.  At baseline oriented to self but has been less communicative, somnolent than baseline.  Has had recurrent UTI in the past. Last admission:7/10 to 0/27 with metabolic encephalopathy UTI and discharged on home hospice. 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    Subjective: Seen and examined this morning appears more alert awake difficult to comprehend what she is saying able to tell me her name Overnight BP 140s to 170s VBG this morning with PCO2 39 PO2 104  Assessment and Plan: Principal Problem:   Acute metabolic encephalopathy Active Problems:   HLD (hyperlipidemia)   Essential hypertension   CAP (community acquired pneumonia)   Pleural effusion on right   Acute respiratory failure with hypoxia (HCC)   Hypercalcemia   Chronic diastolic CHF (congestive heart failure) (HCC)   Acute metabolic encephalopathy in the  background of dementia: hard  of hearing.  Has had extensive work-up as above no UTI, question pneumonia versus atelectasis from pleural effusion> after thoracentesis chest x-ray is unremarkable.  Continue to supportive care fall precaution speech eval.    Large right pleural effusion with extensive right pulmonary collapse/ possible pneumonia: Doubt infection procalcitonin is negative repeat chest x-ray after thoracentesis unremarkable .  Source of pleural effusion is unclear she has a grade 1 diastolic dysfunction and moderate to severe MR but no overt heart failure history, echo pending today.  Could be in the setting of malnourishment decreased albumin, cardiology suggesting conservative management palliative care has been consulted.  Cytology pending, Gram stain unremarkable, LDH 126   Acute hypoxic respiratory failure in the setting of pleural effusion continue supplemental oxygen.   Hypokalemia repleted IV Hypernatremia-iresolved Essential hypertension BP borderline controlled.  Patient on amlodipine 5 mg, Metoprolol 100 mg, and hydralazine 75 mg as per daughter.  Dr. Claiborne Billings has been managing hypertension and recently blood pressure medication were adjusted.  Continue as needed IV meds   Chronic diastolic CHF: f/u echo. Hyperlipidemia : on zetia   Goals of care : Currently on home hospice, advanced age and multiple comorbidities will benefit palliative care evaluation.  Overall prognosis not bright, DNR confirmed with the daughter.  Awaiting for palliative care evaluation.  Poor oral intake, speech has been consulted, creatinine consulted, continue gentle IV fluid hydration for now Severe Malnutrition with BMI 16, dietitian consulted Nutrition Status:        Pressure ulcer stage II sacrum POA see below: Pressure Injury 10/14/22 Sacrum Stage 2 -  Partial thickness loss of dermis presenting as a shallow open injury with a red, pink wound bed without slough. (Active)  10/14/22 2130  Location: Sacrum  Location  Orientation:   Staging: Stage 2 -  Partial thickness loss of dermis presenting as a shallow open injury with a red, pink wound bed without slough.  Wound Description (Comments):   Present on Admission: Yes  Dressing Type Foam - Lift dressing to assess site every shift 10/14/22 2325   DVT prophylaxis: SCDs Start: 10/14/22 0151 Code Status:   Code Status: DNR Family Communication: plan of care discussed with patient.daughter was updated previously  Patient status is: inpatient because of ongoing encephalopathy Level of care: Telemetry Medical   Dispo: The patient is from: Home with hospice            Anticipated disposition: TBD Mobility Assessment (last 72 hours)     Mobility Assessment   No documentation.          Objective: Vitals last 24 hrs: Vitals:   10/14/22 2109 10/15/22 0109 10/15/22 0504 10/15/22 0912  BP: (!) 158/77 (!) 143/66 (!) 152/63 (!) 176/76  Pulse: 74 75 71 66  Resp: '19 18 16   '$ Temp: 97.9 F (36.6 C) 98 F (36.7 C) 98.6 F (37 C) 97.6 F (36.4 C)  TempSrc: Axillary Oral Oral Oral  SpO2: 98% 96% 97% 98%  Weight: 42.9 kg      Weight change:   Physical Examination: General exam: alert awake, oriented x1 older than stated age. HEENT:Oral mucosa moist,Ear/Nose WNL grossly. Respiratory system: bilaterally clear BS, no use of accessory muscle. Cardiovascular system: S1 & S2 +, No JVD. Gastrointestinal system: Abdomen soft,NT,ND, BS+ Nervous System:Alert, awake, moving extremities. Extremities: LE edema neg,distal peripheral pulses palpable.  Skin: No rashes,no icterus. MSK: Normal muscle bulk,tone, power  Medications reviewed:  Scheduled Meds:  amLODipine  5 mg Oral QPM   cilostazol  50 mg Oral BID WC   ezetimibe  10 mg Oral Daily   hydrALAZINE  75 mg Oral BID   latanoprost  1 drop Both Eyes BID   metoprolol succinate  100 mg Oral Q supper  ontinuous Infusions:  azithromycin 500 mg (10/15/22 0015)   cefTRIAXone (ROCEPHIN)  IV 1 g (10/14/22  2256)   Diet Order             DIET DYS 3 Room service appropriate? Yes; Fluid consistency: Thin  Diet effective now                  Intake/Output Summary (Last 24 hours) at 10/15/2022 0929 Last data filed at 10/15/2022 0601 Gross per 24 hour  Intake 906.67 ml  Output --  Net 906.67 ml   Net IO Since Admission: 656.67 mL [10/15/22 0929]  Wt Readings from Last 3 Encounters:  10/14/22 42.9 kg  05/03/22 46.4 kg  03/23/21 48.7 kg    Unresulted Labs (From admission, onward)     Start     Ordered   10/14/22 0635  Parathyroid hormone, intact (no Ca)  Add-on,   AD        10/14/22 2376          Data Reviewed: I have personally reviewed following labs and imaging studies CBC: Recent Labs  Lab 10/13/22 2145 10/14/22 0410  WBC 8.0 7.7  NEUTROABS 5.2 5.0  HGB 14.6 13.9  HCT 45.0 43.5  MCV 98.9 100.0  PLT 237 283   Basic Metabolic Panel: Recent Labs  Lab 10/13/22 2145 10/14/22 0410  NA 146* 144  K 3.8 3.6  CL 110 110  CO2 25 24  GLUCOSE 118* 108*  BUN 18 16  CREATININE  0.94 0.88  CALCIUM 9.5 9.4  MG  --  2.1  PHOS  --  2.2*   GFR: CrCl cannot be calculated (Unknown ideal weight.). Liver Function Tests: Recent Labs  Lab 10/13/22 2145 10/14/22 0410  AST 34 36  ALT 33 33  ALKPHOS 49 46  BILITOT 0.9 0.7  PROT 6.5 6.2*  ALBUMIN 2.8* 2.8*  Coagulation Profile: Recent Labs  Lab 10/14/22 0650  INR 1.1   Thyroid Function Tests: Recent Labs    10/14/22 0650  TSH 2.930   Sepsis Labs: Recent Labs  Lab 10/14/22 0410  PROCALCITON <0.10    Recent Results (from the past 240 hour(s))  Resp Panel by RT-PCR (Flu A&B, Covid) Anterior Nasal Swab     Status: None   Collection Time: 10/13/22 10:55 PM   Specimen: Anterior Nasal Swab  Result Value Ref Range Status   SARS Coronavirus 2 by RT PCR NEGATIVE NEGATIVE Final    Comment: (NOTE) SARS-CoV-2 target nucleic acids are NOT DETECTED.  The SARS-CoV-2 RNA is generally detectable in upper  respiratory specimens during the acute phase of infection. The lowest concentration of SARS-CoV-2 viral copies this assay can detect is 138 copies/mL. A negative result does not preclude SARS-Cov-2 infection and should not be used as the sole basis for treatment or other patient management decisions. A negative result may occur with  improper specimen collection/handling, submission of specimen other than nasopharyngeal swab, presence of viral mutation(s) within the areas targeted by this assay, and inadequate number of viral copies(<138 copies/mL). A negative result must be combined with clinical observations, patient history, and epidemiological information. The expected result is Negative.  Fact Sheet for Patients:  EntrepreneurPulse.com.au  Fact Sheet for Healthcare Providers:  IncredibleEmployment.be  This test is no t yet approved or cleared by the [place] FDA and  has been authorized for detection and/or diagnosis of SARS-CoV-2 by FDA under an Emergency Use Authorization (EUA). This EUA will remain  in effect (meaning this test can be used) for the duration of the COVID-19 declaration under Section 564(b)(1) of the Act, 21 U.S.C.section 360bbb-3(b)(1), unless the authorization is terminated  or revoked sooner.       Influenza A by PCR NEGATIVE NEGATIVE Final   Influenza B by PCR NEGATIVE NEGATIVE Final    Comment: (NOTE) The Xpert Xpress SARS-CoV-2/FLU/RSV plus assay is intended as an aid in the diagnosis of influenza from Nasopharyngeal swab specimens and should not be used as a sole basis for treatment. Nasal washings and aspirates are unacceptable for Xpert Xpress SARS-CoV-2/FLU/RSV testing.  Fact Sheet for Patients: EntrepreneurPulse.com.au  Fact Sheet for Healthcare Providers: IncredibleEmployment.be  This test is not yet approved or cleared by the [place] FDA and has been  authorized for detection and/or diagnosis of SARS-CoV-2 by FDA under an Emergency Use Authorization (EUA). This EUA will remain in effect (meaning this test can be used) for the duration of the COVID-19 declaration under Section 564(b)(1) of the Act, 21 U.S.C. section 360bbb-3(b)(1), unless the authorization is terminated or revoked.  Performed at [facility], [place] [address] [place], [place] [address]   Gram stain     Status: None   Collection Time: 10/14/22  1:21 PM   Specimen: Lung, Right; Pleural Fluid  Result Value Ref Range Status   Specimen Description LUNG  Final   Special Requests NONE  Final   Gram Stain   Final    WBC PRESENT,BOTH PMN AND MONONUCLEAR NO ORGANISMS SEEN CYTOSPIN SMEAR Performed  at [facility], [place] [address], [place], [place] [address]    Report Status 10/14/2022 FINAL  Final  Culture, body fluid w Gram Stain-bottle     Status: None (Preliminary result)   Collection Time: 10/14/22  1:21 PM   Specimen: Pleura  Result Value Ref Range Status   Specimen Description PLEURAL  Final   Special Requests RIGHT  Final   Culture   Final    NO GROWTH < 24 HOURS Performed at [facility], [place] [address], [place], [place] [address]    Report Status PENDING  Incomplete    Antimicrobials: Anti-infectives (From admission, onward)    Start     Dose/Rate Route Frequency Ordered Stop   10/14/22 2200  cefTRIAXone (ROCEPHIN) 1 g in sodium chloride 0.9 % 100 mL IVPB        1 g 200 mL/hr over 30 Minutes Intravenous Every 24 hours 10/14/22 0153     10/14/22 2200  azithromycin (ZITHROMAX) 500 mg in sodium chloride 0.9 % 250 mL IVPB        500 mg 250 mL/hr over 60 Minutes Intravenous Every 24 hours 10/14/22 0153     10/14/22 0130  cefTRIAXone (ROCEPHIN) 1 g in sodium chloride 0.9 % 100 mL IVPB        1 g 200 mL/hr over 30 Minutes Intravenous  Once 10/14/22 0118 10/14/22 0215   10/14/22 0130  azithromycin (ZITHROMAX) 500 mg in sodium chloride 0.9 % 250  mL IVPB        500 mg 250 mL/hr over 60 Minutes Intravenous  Once 10/14/22 0118 10/14/22 0322      Culture/Microbiology    Component Value Date/Time   SDES LUNG 10/14/2022 [place] 10/14/2022 1321   SPECREQUEST NONE 10/14/2022 1321   SPECREQUEST RIGHT 10/14/2022 1321   CULT  10/14/2022 1321    NO GROWTH < 24 HOURS Performed at [facility], [place] [address], [place], [place] [address]    REPTSTATUS 10/14/2022 FINAL 10/14/2022 1321   REPTSTATUS PENDING 10/14/2022 1321    Other culture-see note  Radiology Studies: IR THORACENTESIS ASP PLEURAL SPACE W/IMG GUIDE  Result Date: 10/14/2022 INDICATION: Patient with history of CKD presents with shortness of breath, previous CT showed right pleural effusion. Request for therapeutic and diagnostic thoracentesis. EXAM: ULTRASOUND GUIDED RIGHT THORACENTESIS MEDICATIONS: 5 mL 1% lidocaine COMPLICATIONS: None immediate. PROCEDURE: An ultrasound guided thoracentesis was thoroughly discussed with the patient's daughter and questions answered. The benefits, risks, alternatives and complications were also discussed. The patient's daughter understands and wishes to proceed with the procedure. Written consent was obtained. Ultrasound was performed to localize and mark an adequate pocket of fluid in the right chest. The area was then prepped and draped in the normal sterile fashion. 1% Lidocaine was used for local anesthesia. Under ultrasound guidance a 6 Fr Safe-T-Centesis catheter was introduced. Thoracentesis was performed. The catheter was removed and a dressing applied. FINDINGS: A total of approximately 1.2 L of hazy amber fluid was removed. Samples were sent to the laboratory as requested by the clinical team. Post procedure chest X-ray reviewed, negative for pneumothorax. IMPRESSION: Successful ultrasound guided right thoracentesis yielding 1.2 L of pleural fluid. Read by: Durenda Guthrie, PA-C Electronically Signed   By: Jerilynn Mages.  Shick M.D.   On:  10/14/2022 15:34   DG Chest 1 View  Result Date: 10/14/2022 CLINICAL DATA:  Status post right thoracentesis. EXAM: CHEST  1 VIEW COMPARISON:  October 13, 2022. FINDINGS:  No pneumothorax status post right-sided thoracentesis. Right pleural effusion is nearly resolved. IMPRESSION: No pneumothorax status post right-sided thoracentesis. Electronically Signed   By: Marijo Conception M.D.   On: 10/14/2022 13:02   CT CHEST WO CONTRAST  Result Date: 10/14/2022 CLINICAL DATA:  Respiratory illness with nondiagnostic x-ray EXAM: CT CHEST WITHOUT CONTRAST TECHNIQUE: Multidetector CT imaging of the chest was performed following the standard protocol without IV contrast. RADIATION DOSE REDUCTION: This exam was performed according to the departmental dose-optimization program which includes automated exposure control, adjustment of the mA and/or kV according to patient size and/or use of iterative reconstruction technique. COMPARISON:  None Available. FINDINGS: Cardiovascular: Generous heart size. Dual-chamber pacer leads from the left. No pericardial effusion. Generalized atheromatous calcification. Mediastinum/Nodes: Negative for adenopathy or mass. Lungs/Pleura: Large right pleural effusion with collapse of the lower lobe and extensive upper lobe atelectasis. Collapse of left lower lobe airways but aerated left lung without consolidation or edema. No pneumothorax. Upper Abdomen: Cyst with layering numerous calculi at the upper pole left kidney, cyst measuring up to 5 cm. Cholecystectomy and atherosclerosis. Musculoskeletal: Compression fractures of T3, T6, T9, T12, L1, and L2. The most recent appearing is at T12 where there is sclerosis and some visible fracture plane. Moderate to advanced height loss at T9, T12, L1, and L2. no retropulsion of note. 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 IMPRESSION: 1. Large low-density right pleural effusion with extensive right pulmonary collapse. 2. Multiple thoracic and lumbar  compression fractures with subacute appearance at T12. 3. 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 Atherosclerosis and multiple left renal calculi. Electronically Signed   By: Jorje Guild M.D.   On: 10/14/2022 07:34   CT HEAD WO CONTRAST (5MM)  Result Date: 10/13/2022 CLINICAL DATA:  Increasing weakness for 1 week, dementia, urinary tract infection EXAM: CT HEAD WITHOUT CONTRAST TECHNIQUE: Contiguous axial images were obtained from the base of the skull through the vertex without intravenous contrast. RADIATION DOSE REDUCTION: This exam was performed according to the departmental dose-optimization program which includes automated exposure control, adjustment of the mA and/or kV according to patient size and/or use of iterative reconstruction technique. COMPARISON:  05/24/2022 FINDINGS: Brain: Diffuse cerebral atrophy. Chronic small-vessel ischemic changes throughout the periventricular white matter. No acute infarct or hemorrhage. Lateral ventricles and midline structures are unremarkable. No acute extra-axial fluid collections. No mass effect. Vascular: Diffuse atherosclerosis.  No hyperdense vessel. Skull: Normal. Negative for fracture or focal lesion. Sinuses/Orbits: No acute finding. Other: None. IMPRESSION: 1. Stable atrophy and chronic small vessel ischemic changes. No acute intracranial process. Electronically Signed   By: Randa Ngo M.D.   On: 10/13/2022 22:22   DG Chest 2 View  Result Date: 10/13/2022 CLINICAL DATA:  Weakness EXAM: CHEST - 2 VIEW COMPARISON:  Radiographs 05/24/2022 FINDINGS: New moderate right pleural effusion and associated atelectasis/consolidation. Left lung is clear. No pneumothorax. Stable cardiomegaly. Aortic atherosclerotic calcification. Left chest wall pacemaker. IMPRESSION: New moderate right pleural effusion and associated atelectasis or pneumonia. Electronically Signed   By: Placido Sou M.D.   On: 10/13/2022 20:24     LOS: 1 day   Antonieta Pert, MD [facility]  10/15/2022, 9:29 AM

## 2022-10-15 NOTE — Progress Notes (Signed)
Initial Nutrition Assessment  DOCUMENTATION CODES:   Underweight, suspect malnutrition is present  INTERVENTION:   - Continue Ensure Enlive po BID, each supplement provides 350 kcal and 20 grams of protein  - Magic Cup BID with lunch and dinner meals while on dysphagia 1 diet, each supplement provides 290 kcal and 9 grams of protein  - MVI with minerals daily  - Encourage PO intake and provide feeding assistance as needed  NUTRITION DIAGNOSIS:   Increased nutrient needs related to acute illness as evidenced by estimated needs.  GOAL:   Patient will meet greater than or equal to 90% of their needs  MONITOR:   PO intake, Supplement acceptance, Diet advancement, Labs, Weight trends, Skin  REASON FOR ASSESSMENT:   Consult Assessment of nutrition requirement/status  ASSESSMENT:   88 year old female who presented to the ED on 11/29 with AMS. PMH of dementia, HTN, HLD, CHF, CKD. Pt admitted with acute metabolic encephalopathy, CAP, right pleural effusion.  11/30 - s/p R thoracentesis with 1.2 L fluid removed  RD working remotely.  Per SLP note, pt too lethargic to participate in swallow evaluation. Family who was present at bedside requested pureed diet. Family reports that the only concern for swallowing at home is that pt pockets meats; family no longer give pt meats. No meal completions charted this admission.  Reviewed weight history in chart. Pt with a steady decline in weight over the last 3-4 years. Pt with a weight loss of 3.5 kg since 05/03/22. This is a 7.5% weight loss in 5.5 months which is not clinically significant for timeframe but is concerning given progressive nature of weight loss and underweight BMI. Suspect pt with malnutrition but RD unable to confirm without NFPE.  Medications reviewed and include: Ensure Enlive BID, IV abx, IV KCl 10 mEq x 3 IVF: D5NS @ 30 ml/hr  Labs reviewed: potassium 3.4  NUTRITION - FOCUSED PHYSICAL EXAM:  Unable to complete at  this time. RD working remotely.  Diet Order:   Diet Order             DIET - DYS 1 Room service appropriate? Yes; Fluid consistency: Thin  Diet effective now                   EDUCATION NEEDS:   Not appropriate for education at this time  Skin:  Skin Assessment: Skin Integrity Issues: Stage II: sacrum  Last BM:  no documented BM  Height:   Ht Readings from Last 1 Encounters:  03/23/21 '5\' 4"'$  (1.626 m)    Weight:   Wt Readings from Last 1 Encounters:  10/14/22 42.9 kg    Ideal Body Weight:  54.5 kg  BMI:  Body mass index is 16.23 kg/m.  Estimated Nutritional Needs:   Kcal:  1300-1500  Protein:  60-75 grams  Fluid:  1.3-1.5 L    Gustavus Bryant, MS, RD, LDN Inpatient Clinical Dietitian Please see AMiON for contact information.

## 2022-10-16 DIAGNOSIS — G9341 Metabolic encephalopathy: Secondary | ICD-10-CM | POA: Diagnosis not present

## 2022-10-16 DIAGNOSIS — J90 Pleural effusion, not elsewhere classified: Secondary | ICD-10-CM | POA: Diagnosis not present

## 2022-10-16 DIAGNOSIS — J189 Pneumonia, unspecified organism: Secondary | ICD-10-CM | POA: Diagnosis not present

## 2022-10-16 DIAGNOSIS — Z515 Encounter for palliative care: Secondary | ICD-10-CM | POA: Diagnosis not present

## 2022-10-16 LAB — CBC
HCT: 47.7 % — ABNORMAL HIGH (ref 36.0–46.0)
Hemoglobin: 15.1 g/dL — ABNORMAL HIGH (ref 12.0–15.0)
MCH: 31.3 pg (ref 26.0–34.0)
MCHC: 31.7 g/dL (ref 30.0–36.0)
MCV: 99 fL (ref 80.0–100.0)
Platelets: UNDETERMINED 10*3/uL (ref 150–400)
RBC: 4.82 MIL/uL (ref 3.87–5.11)
RDW: 14.5 % (ref 11.5–15.5)
WBC: 6.9 10*3/uL (ref 4.0–10.5)
nRBC: 0 % (ref 0.0–0.2)

## 2022-10-16 LAB — BASIC METABOLIC PANEL
Anion gap: 12 (ref 5–15)
BUN: 15 mg/dL (ref 8–23)
CO2: 20 mmol/L — ABNORMAL LOW (ref 22–32)
Calcium: 9.9 mg/dL (ref 8.9–10.3)
Chloride: 114 mmol/L — ABNORMAL HIGH (ref 98–111)
Creatinine, Ser: 0.85 mg/dL (ref 0.44–1.00)
GFR, Estimated: >60 mL/min (ref >60)
Glucose, Bld: 100 mg/dL — ABNORMAL HIGH (ref 70–99)
Potassium: 4.2 mmol/L (ref 3.5–5.1)
Sodium: 146 mmol/L — ABNORMAL HIGH (ref 135–145)

## 2022-10-16 LAB — LACTATE DEHYDROGENASE: LDH: 206 U/L — ABNORMAL HIGH (ref 98–192)

## 2022-10-16 MED ORDER — DEXTROSE 5 % IV SOLN: INTRAVENOUS | Status: DC

## 2022-10-16 MED ORDER — MIRTAZAPINE 15 MG PO TABS
15.0000 mg | ORAL_TABLET | Freq: Every day | ORAL | Status: DC
  Administered 2022-10-16 – 2022-10-19 (×4): 15 mg via ORAL
  Filled 2022-10-16 (×4): qty 1

## 2022-10-16 MED ORDER — DORZOLAMIDE HCL-TIMOLOL MAL 2-0.5 % OP SOLN
1.0000 [drp] | Freq: Two times a day (BID) | OPHTHALMIC | Status: DC
  Administered 2022-10-16 – 2022-10-20 (×8): 1 [drp] via OPHTHALMIC
  Filled 2022-10-16: qty 10

## 2022-10-16 NOTE — Evaluation (Signed)
Clinical/Bedside Swallow Evaluation Patient Details  Name: Julie Bass MRN: [id number] Date of Birth: 10-09-1926  Today's Date: 10/16/2022 Time: SLP Start Time (ACUTE ONLY): 83 SLP Stop Time (ACUTE ONLY): 1027 SLP Time Calculation (min) (ACUTE ONLY): 15 min  Past Medical History:  Past Medical History:  Diagnosis Date   Arthritis    Chronic kidney disease    KIDNEY STONES   Dementia ([place])    BEGINNING STAGES    Depression    GERD (gastroesophageal reflux disease)    Glaucoma    Hiatal hernia    HTN (hypertension) 10/18/2011   Hyperlipemia 10/18/2011   NSVT (nonsustained ventricular tachycardia) (HCC) 10/18/2011   Osteoporosis    PAD (peripheral artery disease) ([place]) 10/18/2011   h/o left SFA stent   Past Surgical History:  Past Surgical History:  Procedure Laterality Date   ABDOMINAL HYSTERECTOMY  1973   CARDIAC CATHETERIZATION     2012   CHOLECYSTECTOMY     IR THORACENTESIS ASP PLEURAL SPACE W/IMG GUIDE  10/14/2022   LOOP RECORDER IMPLANT N/A 08/27/2014   Procedure: LOOP RECORDER IMPLANT;  Surgeon: Julie Mark, MD;  Location: [facility];  Service: Cardiovascular;  Laterality: N/A;   LOOP RECORDER REMOVAL N/A 01/09/2018   Procedure: LOOP RECORDER REMOVAL;  Surgeon: Julie Lance, MD;  Location: [facility];  Service: Cardiovascular;  Laterality: N/A;   LOWER EXTREMITY ANGIOGRAM  11/02/2007   stent of mid left SFA with 6x128m EV3 self-expanding stent and 6x3 in prox region (Dr. JAdora Bass   [place]  2013   right SFA with 50-69% diameter reduction, right PTA/peroneal occluded, L SFA prox to stent has narrowing with increased velocities >60% diameter reduction, L SFA stent patent, L ATA w/occlusive disease, bilat ABIs show mild arterial insuffiency at rest   NM MYOCAR PERF WALL MOTION  10/2011   non-gated - normal stuy, EF 82%, normal LV wall motion   PACEMAKER IMPLANT N/A 01/09/2018   Procedure: PACEMAKER  IMPLANT;  Surgeon: Julie Lance MD;  [facility];  Service: Cardiovascular;  Laterality: N/A;   REVERSE SHOULDER ARTHROPLASTY Right 01/26/2013   Procedure: RIGHT REVERSE TOTAL SHOULDER ARTHROPLASTY;  Surgeon: Julie Schooling MD;  [facility]  Service: Orthopedics;  Laterality: Right;   TRANSTHORACIC ECHOCARDIOGRAM  10/2012   EF 75-70%, mod conc hypertrophy, severely calcified MV annulus, LA mildly dailted, PA peak pressure 365mg   HPI:  Pt is a [age]o female presenting 11/29 with acute metabolic encephalopathy, large right pleural effusion with extensive right pulmonary collapse/possible pneumonia, acute hypoxic respiratory failure, hypercalcemia. Palliative Care is following while admitted. PMH includes: dementia (oriented to self only at baseline), GERD, HH, CKD, glaucoma, HTN, HLD    Assessment / Plan / Recommendation  Clinical Impression  Pt was quite sleepy but could be roused for brief moments to participate in limited swallow assessment.  She was repositioned and HOB was elevated. She maintained eyes closed. Pt accepted sips of water from a straw with no s/s of aspiration.She self-limited to half-tspn portions of applesauce, but manipulated with full attention and swallowed with no s/s of difficulty.  Recommend continuing current diet (dysphagia 1/thin liquids) with support for feeding. Give meds whole in puree.  At this time, the mechanics of Julie Bass's swallowing appear to be quite functional; the obstacle will be PO intake. SLP will follow. SLP Visit Diagnosis: Dysphagia, unspecified (R13.10)    Aspiration Risk  unknown   Diet Recommendation   Dysphagia 1, thin liquids  Medication Administration: Whole meds with puree    Other  Recommendations Oral Care Recommendations: Oral care BID    Recommendations for follow up therapy are one component of a multi-disciplinary discharge planning process, led by the attending physician.  Recommendations may be  updated based on patient status, additional functional criteria and insurance authorization.  Follow up Recommendations No SLP follow up              Frequency and Duration min 2x/week  1 week       Prognosis Prognosis for Safe Diet Advancement: Guarded      Swallow Study   General Date of Onset: 10/15/22 HPI: Pt is a 88 yo female presenting 11/29 with acute metabolic encephalopathy, large right pleural effusion with extensive right pulmonary collapse/possible pneumonia, acute hypoxic respiratory failure, hypercalcemia. Palliative Care is following while admitted. PMH includes: dementia (oriented to self only at baseline), GERD, HH, CKD, glaucoma, HTN, HLD Type of Study: Bedside Swallow Evaluation Previous Swallow Assessment: no Diet Prior to this Study: Dysphagia 1 (puree);Thin liquids Temperature Spikes Noted: No Respiratory Status: Room air History of Recent Intubation: No Behavior/Cognition: Lethargic/Drowsy Oral Cavity Assessment: Within Functional Limits Oral Cavity - Dentition: Missing dentition Self-Feeding Abilities: Needs assist Patient Positioning: Upright in bed Baseline Vocal Quality: Normal Volitional Cough: Strong Volitional Swallow: Able to elicit    Oral/Motor/Sensory Function Overall Oral Motor/Sensory Function: Within functional limits   Ice Chips Ice chips: Not tested   Thin Liquid Thin Liquid: Within functional limits    Nectar Thick Nectar Thick Liquid: Not tested   Honey Thick Honey Thick Liquid: Not tested   Puree Puree: Within functional limits   Solid     Solid: Not tested      Julie Bass 10/16/2022,11:39 AM  Julie Bamberg L. Tivis Ringer, MA CCC/SLP Clinical Specialist - [place] Office number [phone]

## 2022-10-16 NOTE — Plan of Care (Signed)

## 2022-10-16 NOTE — Progress Notes (Signed)
Cardiology will sign off today per Dr. Harrington Challenger

## 2022-10-16 NOTE — Progress Notes (Signed)
Daily Progress Note   Patient Name: Julie Bass       Date: 10/16/2022 DOB: 1926-05-26  Age: 88 y.o. MRN#: [id number] Attending Physician: Antonieta Pert, MD Primary Care Physician: Denita Lung, MD Admit Date: 10/13/2022  Reason for Consultation/Follow-up: Establishing goals of care  Subjective:  10:36 AM Discussed case in detail with TOC, Dr. Lupita Leash, primary RN, [facility] hospice liaison.   3:00 PM Chart review performed. Received report from primary RN - no acute concerns. RN reports patient sleeps most of the day outside of meal times. Was accepting of some food today.   Went to visit patient at bedside -no family/visitors present. Patient was lying in bed asleep - I did not attempt to wake her. No signs or non-verbal gestures of pain or discomfort noted. No respiratory distress, increased work of breathing, or secretions noted.    Length of Stay: 2  Current Medications: Scheduled Meds:   amLODipine  5 mg Oral QPM   cilostazol  50 mg Oral BID WC   dorzolamide-timolol  1 drop Both Eyes BID   ezetimibe  10 mg Oral Daily   feeding supplement  237 mL Oral BID BM   hydrALAZINE  75 mg Oral BID   latanoprost  1 drop Both Eyes BID   metoprolol succinate  100 mg Oral Q supper   mirtazapine  15 mg Oral QHS   multivitamin with minerals  1 tablet Oral Daily    Continuous Infusions:  dextrose 30 mL/hr at 10/16/22 0828    PRN Meds: acetaminophen **OR** acetaminophen, hydrALAZINE, melatonin  Physical Exam Vitals and nursing note reviewed.  Constitutional:      General: She is not in acute distress. Pulmonary:     Effort: No respiratory distress.  Skin:    General: Skin is warm and dry.  Neurological:     Mental Status: She is alert and oriented to person, place, and time.     Motor:  Weakness present.  Psychiatric:        Attention and Perception: Attention normal.        Behavior: Behavior is cooperative.        Cognition and Memory: Cognition and memory normal.             Vital Signs: BP (!) 130/55 (BP Location: Left Arm)   Pulse  66   Temp 98.2 F (36.8 C)   Resp 18   Wt 43.1 kg   SpO2 99%   BMI 16.31 kg/m  SpO2: SpO2: 99 % O2 Device: O2 Device: Room Air O2 Flow Rate: O2 Flow Rate (L/min): 3 L/min  Intake/output summary:  Intake/Output Summary (Last 24 hours) at 10/16/2022 1459 Last data filed at 10/16/2022 1300 Gross per 24 hour  Intake 1359.22 ml  Output 550 ml  Net 809.22 ml   LBM:   Baseline Weight: Weight: 42.9 kg Most recent weight: Weight: 43.1 kg       Palliative Assessment/Data: PPS 20%      Patient Active Problem List   Diagnosis Date Noted   CAP (community acquired pneumonia) 10/14/2022   Pleural effusion on right 10/14/2022   Acute respiratory failure with hypoxia ([place]) 10/14/2022   Hypercalcemia 10/14/2022   Chronic diastolic CHF (congestive heart failure) ([place]) 10/14/2022   UTI (urinary tract infection) 05/26/2022   Dementia without behavioral disturbance ([place]) 05/26/2022   Encephalopathy acute 05/25/2022   Submandibular gland mass 29/93/7169   Acute metabolic encephalopathy 67/89/3810   Lactic acidosis 05/29/2020   Hypertensive urgency 05/29/2020   Hiatal hernia 09/18/2019   Peripheral artery disease ([place]) 09/18/2019   Acute renal failure superimposed on stage 3a chronic kidney disease ([place]) 09/18/2019   Essential hypertension 09/18/2019   Neck mass 09/18/2019   Weakness 09/17/2019   Pacemaker 05/28/2019   Bilateral sensorineural hearing loss 05/09/2018   CHB (complete heart block) ([place]) 01/20/2018   OSA (obstructive sleep apnea) 01/20/2018   Stokes-Adams syncope 01/09/2018   GERD without esophagitis 01/08/2018   CKD (chronic kidney disease), stage III 01/07/2018   Lower urinary tract infectious disease 06/16/2016    Warthin's tumor 01/01/2016   Dementia ([place]) 03/14/2015   Orthostatic hypotension 08/27/2014   Physical deconditioning 08/27/2014   Abdominal pain in female 08/27/2014   Osteoarthrosis, unspecified whether generalized or localized, shoulder region 01/26/2013   Chest wall pain 12/26/2012   NSVT (nonsustained ventricular tachycardia) ([place]) 10/18/2011   HTN (hypertension) 10/18/2011   HLD (hyperlipidemia) 10/18/2011   PAD (peripheral artery disease) ([place]) 10/18/2011   Syncope 09/30/2011   Pelvic joint pain 09/30/2011   Back pain 09/30/2011    Palliative Care Assessment & Plan   Patient Profile: 88 y.o. female  with past medical history of dementia, essential hypertension, hyperlipidemia, chronic diastolic heart failure, and CKD presented to ED on 10/13/22 from home with family concerns of mental status changes. Patient was admitted on 10/13/2022 with acute metabolic encephalopathy, large right pleural effusion with extensive right pulmonary collapse/possible pneumonia, acute hypoxic respiratory failure, hypercalcemia.    Of note, patient was admitted 05/24/22-05/28/22 with acute metabolic encephalopathy and hypertensive urgency - PMT was consulted for GOC at that time and family opted for patient's discharge with hospice services. Per chart review, patient was admitted under hospice services; however, improved to where she was discharged. Patient is now enrolled in outpatient Palliative Care services with [facility].   Assessment: Principal Problem:   Acute metabolic encephalopathy Active Problems:   HLD (hyperlipidemia)   Essential hypertension   CAP (community acquired pneumonia)   Pleural effusion on right   Acute respiratory failure with hypoxia (HCC)   Hypercalcemia   Chronic diastolic CHF (congestive heart failure) ([place])   Concern about end of life  Recommendations/Plan: Continue gentle supportive treatment for medical optimization Continue DNR/DNI as previously  documented Residential hospice evaluation pending. If not approved, daughter open to home hospice If able,  obtain copy of HCPOA PMT will continue to follow and support holistically   Goals of Care and Additional Recommendations: Limitations on Scope of Treatment: Full Scope Treatment and No Tracheostomy  Code Status:    Code Status Orders  (From admission, onward)           Start     Ordered   10/14/22 1054  Do not attempt resuscitation (DNR)  Continuous       Question Answer Comment  In the event of cardiac or respiratory ARREST Do not call a "code blue"   In the event of cardiac or respiratory ARREST Do not perform Intubation, CPR, defibrillation or ACLS   In the event of cardiac or respiratory ARREST Use medication by any route, position, wound care, and other measures to relive pain and suffering. May use oxygen, suction and manual treatment of airway obstruction as needed for comfort.      10/14/22 1053           Code Status History     Date Active Date Inactive Code Status Order ID Comments User Context   10/14/2022 0151 10/14/2022 1053 Full Code [id number]  Rhetta Mura, DO ED   05/25/2022 0634 05/28/2022 1709 DNR [id number]  Clance Boll, MD ED   05/29/2020 0659 06/01/2020 2013 DNR [id number]  Vernelle Emerald, MD ED   09/17/2019 0318 09/18/2019 2034 Full Code [id number]  Jani Gravel, MD ED   01/08/2018 0011 01/10/2018 2024 Full Code [id number]  Ivor Costa, MD ED   06/16/2016 1755 06/18/2016 2137 Full Code [id number]  Cristal Ford, DO Inpatient   08/23/2014 1950 08/27/2014 2150 Full Code [id number]  Almyra Deforest, [place]   01/26/2013 1250 01/28/2013 1643 Full Code [id number]  Augustin Schooling, MD Inpatient   10/18/2011 2201 10/20/2011 2046 Full Code [id number]  Cecilie Kicks, NP Inpatient       Prognosis:  Poor in the setting of advanced age, advanced dementia, recurrent hospitalizations, poor oral intake, and multiple comorbidities  Discharge Planning: To Be  Determined  Care plan was discussed with primary RN, TOC, Dr. Lupita Leash, hospice liaison  Thank you for allowing the Palliative Medicine Team to assist in the care of this patient.   Total Time 35 minutes Prolonged Time Billed  no       Greater than 50%  of this time was spent counseling and coordinating care related to the above assessment and plan.  Lin Landsman, NP  Please contact Palliative Medicine Team phone at [phone] for questions and concerns.   *Portions of this note are a verbal dictation therefore any spelling and/or grammatical errors are due to the "[place] One" system interpretation.

## 2022-10-16 NOTE — Progress Notes (Signed)
PROGRESS NOTE Julie Bass  AYT:[id number] DOB: April 17, 1926 DOA: 10/13/2022 PCP: Denita Lung, MD   Brief Narrative/Hospital Course: 88 year old female with dementia, CKD stage IIIa, GERD, hypertension, NSVT, PAD brought by EMS from home for "stroke", described as increased weakness x1 week, has been having a mental status for 2 days.  At baseline oriented to self but has been less communicative, somnolent than baseline.  Has had recurrent UTI in the past. Last admission:7/10 to 3/55 with metabolic encephalopathy UTI and discharged on home hospice. 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    Subjective: Seen and examined this morning patient alert awake oriented to self Nursing reports that  she has not been eating well. Overnight patient afebrile BP at times has been in 732K systolic.   Labs reviewed sodium slightly uptrending with hypernatremia CBC stable  but with elevated hemoglobin indicating hemoconcentration  No family at the bedside. Assessment and Plan: Principal Problem:   Acute metabolic encephalopathy Active Problems:   HLD (hyperlipidemia)   Essential hypertension   CAP (community acquired pneumonia)   Pleural effusion on right   Acute respiratory failure with  hypoxia (HCC)   Hypercalcemia   Chronic diastolic CHF (congestive heart failure) (HCC)  Acute metabolic encephalopathy Dementia: hard of hearing.  Has had extensive work-up as above> no UTI, repeat chest x-ray after thoracentesis does not show evidence of pneumonia:Antibiotic discontinued,continue supportive care, fall precaution, hospice evaluation   Large right pleural effusion with extensive right pulmonary collapse/ possible pneumonia: No fever procalcitonin reassuring pleural fluid with no growth in the Gram stain and culture we will discontinue antibiotics. Source of pleural effusion is unclear could be in the setting of hypoalbuminemia, cardiology suggesting conservative management palliative care has been consulted.  Cytology pending, Gram stain unremarkable, LDH 126  Hyponatremia: Changed to D5 water   Acute hypoxic respiratory failure in the setting of pleural effusion continue supplemental oxygen.    Hypokalemia repleted  Essential hypertension BP borderline controlled.  Patient able to take hydralazine metoprolol and amlodipine.  At time remains high continue PRN medication.Dr. Claiborne Billings has been managing hypertension and recently blood pressure medication were adjusted.   Chronic diastolic CHF: Echo reviewed limited yesterday normal LV function mild concentric LVH and severe proximal septal thickening EF 65-70%  Hyperlipidemia : on zetia   Goals of care : Currently on home hospice, advanced age and multiple comorbidities will benefit palliative care evaluation.  By palliative care patient daughter would like to continue current gentle supportive care also interested to explore, patient was previously enrolled in hospice.  TOC consulted for hospice evaluation   Poor oral intake, speech has been consulted, continue gentle IV fluid hydration for now  Increase nutritional limit BMI 16: dietitian consulted Nutrition Problem: Increased nutrient needs Etiology: acute  illness Signs/Symptoms: estimated needs Interventions: Ensure Enlive (each supplement provides 350kcal and 20 grams of protein), Magic cup, MVI  Pressure ulcer stage II  sacrum POA see below: Pressure Injury 10/14/22 Sacrum Stage 2 -  Partial thickness loss of dermis presenting as a shallow open injury with a red, pink wound bed without slough. (Active)  10/14/22 2130  Location: Sacrum  Location Orientation:   Staging: Stage 2 -  Partial thickness loss of dermis presenting as a shallow open injury with a red, pink wound bed without slough.  Wound Description (Comments):   Present on Admission: Yes  Dressing Type Foam - Lift dressing to assess site every shift 10/14/22 2325   DVT prophylaxis: SCDs Start: 10/14/22 0151 Code Status:   Code Status: DNR Family Communication: plan of care discussed with patient.daughter was updated previously  Patient status is: inpatient because of ongoing encephalopathy Level of care: Telemetry Medical  Dispo: The patient is from: Home with hospice            Anticipated disposition: TBD Mobility Assessment (last 72 hours)     Mobility Assessment   No documentation.          Objective: Vitals last 24 hrs: Vitals:   10/15/22 2046 10/16/22 0428 10/16/22 0448 10/16/22 0929  BP: (!) 102/51 137/64  (!) 130/55  Pulse: 68 63  66  Resp: '18 16  18  '$ Temp:  97.6 F (36.4 C)  98.2 F (36.8 C)  TempSrc:  Oral    SpO2: 98% 98%  99%  Weight:   43.1 kg    Weight change: 0.2 kg  Physical Examination: General exam: AAox1, weak,older appearing HEENT:Oral mucosa moist, Ear/Nose WNL grossly, dentition normal. Respiratory system: bilaterally clear BS,no use of accessory muscle Cardiovascular system:S1 & S2 +, regular rate, JVD neg. Gastrointestinal system:Abdomen soft,NT,ND,BS+ Nervous System:Alert, awake, moving extremities and grossly nonfocal Extremities:LE ankle edema neg,lower extremities warm Skin:No rashes,no icterus. YNW:GNFAOZ muscle bulk,tone,  power   Medications reviewed:  Scheduled Meds:  amLODipine  5 mg Oral QPM   cilostazol  50 mg Oral BID WC   ezetimibe  10 mg Oral Daily   feeding supplement  237 mL Oral BID BM   hydrALAZINE  75 mg Oral BID   latanoprost  1 drop Both Eyes BID   metoprolol succinate  100 mg Oral Q supper   multivitamin with minerals  1 tablet Oral Daily  ontinuous Infusions:  dextrose 30 mL/hr at 10/16/22 3086   Diet Order             DIET - DYS 1 Room service appropriate? Yes; Fluid consistency: Thin  Diet effective now                  Intake/Output Summary (Last 24 hours) at 10/16/2022 1151 Last data filed at 10/16/2022 1000 Gross per 24 hour  Intake 1179.22 ml  Output 550 ml  Net 629.22 ml   Net IO Since Admission: 1,285.89 mL [10/16/22 1151]  Wt Readings from Last 3 Encounters:  10/16/22 43.1 kg  05/03/22 46.4 kg  03/23/21 48.7 kg    Unresulted Labs (From admission, onward)     Start     Ordered   10/16/22 0730  Lactate dehydrogenase  Add-on,   AD        10/16/22 0729   10/16/22 5784  Basic metabolic panel  Daily at 5am,   R      10/15/22 0933   10/16/22 0500  CBC  Daily at 5am,   R      10/15/22 0933          Data Reviewed: I have personally  reviewed following labs and imaging studies CBC: Recent Labs  Lab 10/13/22 2145 10/14/22 0410 10/16/22 0222  WBC 8.0 7.7 6.9  NEUTROABS 5.2 5.0  --   HGB 14.6 13.9 15.1*  HCT 45.0 43.5 47.7*  MCV 98.9 100.0 99.0  PLT 237 233 PLATELET CLUMPS NOTED ON SMEAR, UNABLE TO ESTIMATE   Basic Metabolic Panel: Recent Labs  Lab 10/13/22 2145 10/14/22 0410 10/15/22 0814 10/16/22 0222  NA 146* 144 142 146*  K 3.8 3.6 3.4* 4.2  CL 110 110 113* 114*  CO2 '25 24 23 '$ 20*  GLUCOSE 118* 108* 122* 100*  BUN '18 16 12 15  '$ CREATININE 0.94 0.88 0.74 0.85  CALCIUM 9.5 9.4 9.2 9.9  MG  --  2.1  --   --   PHOS  --  2.2*  --   --    GFR: CrCl cannot be calculated (Unknown ideal weight.). Liver Function Tests: Recent Labs  Lab  10/13/22 2145 10/14/22 0410  AST 34 36  ALT 33 33  ALKPHOS 49 46  BILITOT 0.9 0.7  PROT 6.5 6.2*  ALBUMIN 2.8* 2.8*  Coagulation Profile: Recent Labs  Lab 10/14/22 0650  INR 1.1   Thyroid Function Tests: Recent Labs    10/14/22 0650  TSH 2.930   Sepsis Labs: Recent Labs  Lab 10/14/22 0410  PROCALCITON <0.10    Recent Results (from the past 240 hour(s))  Resp Panel by RT-PCR (Flu A&B, Covid) Anterior Nasal Swab     Status: None   Collection Time: 10/13/22 10:55 PM   Specimen: Anterior Nasal Swab  Result Value Ref Range Status   SARS Coronavirus 2 by RT PCR NEGATIVE NEGATIVE Final    Comment: (NOTE) SARS-CoV-2 target nucleic acids are NOT DETECTED.  The SARS-CoV-2 RNA is generally detectable in upper respiratory specimens during the acute phase of infection. The lowest concentration of SARS-CoV-2 viral copies this assay can detect is 138 copies/mL. A negative result does not preclude SARS-Cov-2 infection and should not be used as the sole basis for treatment or other patient management decisions. A negative result may occur with  improper specimen collection/handling, submission of specimen other than nasopharyngeal swab, presence of viral mutation(s) within the areas targeted by this assay, and inadequate number of viral copies(<138 copies/mL). A negative result must be combined with clinical observations, patient history, and epidemiological information. The expected result is Negative.  Fact Sheet for Patients:  EntrepreneurPulse.com.au  Fact Sheet for Healthcare Providers:  IncredibleEmployment.be  This test is no t yet approved or cleared by the [place] FDA and  has been authorized for detection and/or diagnosis of SARS-CoV-2 by FDA under an Emergency Use Authorization (EUA). This EUA will remain  in effect (meaning this test can be used) for the duration of the COVID-19 declaration under Section 564(b)(1) of the  Act, 21 U.S.C.section 360bbb-3(b)(1), unless the authorization is terminated  or revoked sooner.       Influenza A by PCR NEGATIVE NEGATIVE Final   Influenza B by PCR NEGATIVE NEGATIVE Final    Comment: (NOTE) The Xpert Xpress SARS-CoV-2/FLU/RSV plus assay is intended as an aid in the diagnosis of influenza from Nasopharyngeal swab specimens and should not be used as a sole basis for treatment. Nasal washings and aspirates are unacceptable for Xpert Xpress SARS-CoV-2/FLU/RSV testing.  Fact Sheet for Patients: EntrepreneurPulse.com.au  Fact Sheet for Healthcare Providers: IncredibleEmployment.be  This test is not yet approved or cleared by the [place] FDA and has been authorized for detection and/or  diagnosis of SARS-CoV-2 by FDA under an Emergency Use Authorization (EUA). This EUA will remain in effect (meaning this test can be used) for the duration of the COVID-19 declaration under Section 564(b)(1) of the Act, 21 U.S.C. section 360bbb-3(b)(1), unless the authorization is terminated or revoked.  Performed at [facility], [place] [address] [place], [place] [address]   Gram stain     Status: None   Collection Time: 10/14/22  1:21 PM   Specimen: Lung, Right; Pleural Fluid  Result Value Ref Range Status   Specimen Description LUNG  Final   Special Requests NONE  Final   Gram Stain   Final    WBC PRESENT,BOTH PMN AND MONONUCLEAR NO ORGANISMS SEEN CYTOSPIN SMEAR Performed at [facility], [place] [address], [place], [place] [address]    Report Status 10/14/2022 FINAL  Final  Culture, body fluid w Gram Stain-bottle     Status: None (Preliminary result)   Collection Time: 10/14/22  1:21 PM   Specimen: Pleura  Result Value Ref Range Status   Specimen Description PLEURAL  Final   Special Requests RIGHT  Final   Culture   Final    NO GROWTH 2 DAYS Performed at [facility] [address] [place], [place] [address]     Report Status PENDING  Incomplete    Antimicrobials: Anti-infectives (From admission, onward)    Start     Dose/Rate Route Frequency Ordered Stop   10/14/22 2200  cefTRIAXone (ROCEPHIN) 1 g in sodium chloride 0.9 % 100 mL IVPB  Status:  Discontinued        1 g 200 mL/hr over 30 Minutes Intravenous Every 24 hours 10/14/22 0153 10/16/22 0730   10/14/22 2200  azithromycin (ZITHROMAX) 500 mg in sodium chloride 0.9 % 250 mL IVPB  Status:  Discontinued        500 mg 250 mL/hr over 60 Minutes Intravenous Every 24 hours 10/14/22 0153 10/16/22 0730   10/14/22 0130  cefTRIAXone (ROCEPHIN) 1 g in sodium chloride 0.9 % 100 mL IVPB        1 g 200 mL/hr over 30 Minutes Intravenous  Once 10/14/22 0118 10/14/22 0215   10/14/22 0130  azithromycin (ZITHROMAX) 500 mg in sodium chloride 0.9 % 250 mL IVPB        500 mg 250 mL/hr over 60 Minutes Intravenous  Once 10/14/22 0118 10/14/22 0322      Culture/Microbiology    Component Value Date/Time   SDES LUNG 10/14/2022 [place] 10/14/2022 1321   SPECREQUEST NONE 10/14/2022 1321   SPECREQUEST RIGHT 10/14/2022 1321   CULT  10/14/2022 1321    NO GROWTH 2 DAYS Performed at [facility] [address] [place], [place] [address]    REPTSTATUS 10/14/2022 FINAL 10/14/2022 1321   REPTSTATUS PENDING 10/14/2022 1321    Other culture-see note  Radiology Studies: ECHOCARDIOGRAM LIMITED  Result Date: 10/15/2022    ECHOCARDIOGRAM LIMITED REPORT   Patient Name:   Julie Bass Date of Exam: 10/15/2022 Medical Rec #:  [id number]        Height:       64.0 in Accession #:    [id number]       Weight:       94.6 lb Date of Birth:  [birth year]        BSA:          1.423 m Patient Age:    12 years  BP:           176/76 mmHg Patient Gender: F                HR:           67 bpm. Exam Location:  Inpatient Procedure: Limited Echo, Cardiac Doppler and Limited Color Doppler Indications:    Congestive Heart Failure I50.9  History:        Patient has  prior history of Echocardiogram examinations, most                 recent 05/25/2022. CHF, Pacemaker, PAD and AMS,                 Arrythmias:Tachycardia, Signs/Symptoms:Syncope and Chest Pain;                 Risk Factors:Hypertension, Dyslipidemia and Former Smoker.  Sonographer:    Greer Pickerel Referring Phys: [id number] [place]  Sonographer Comments: Image acquisition challenging due to patient body habitus, Image acquisition challenging due to respiratory motion and Image acquisition challenging due to uncooperative patient. IMPRESSIONS  1. Limited study; not all views obtained; normal LV function; mild concentric LVH and severe proximal septal thickening; chordal SAM; peak LVOT gradient 3.5 m/s at rest.  2. Left ventricular ejection fraction, by estimation, is 65 to 70%. The left ventricle has normal function. The left ventricle has no regional wall motion abnormalities.  3. Right ventricular systolic function is normal. The right ventricular size is normal.  4. The mitral valve is normal in structure. Mild mitral valve regurgitation. No evidence of mitral stenosis. Severe mitral annular calcification.  5. The aortic valve is tricuspid. Aortic valve regurgitation is trivial. Mild aortic valve stenosis. FINDINGS  Left Ventricle: Left ventricular ejection fraction, by estimation, is 65 to 70%. The left ventricle has normal function. The left ventricle has no regional wall motion abnormalities. The left ventricular internal cavity size was normal in size. There is  no left ventricular hypertrophy. Right Ventricle: The right ventricular size is normal. Right ventricular systolic function is normal. Left Atrium: Left atrial size was normal in size. Right Atrium: Right atrial size was normal in size. Pericardium: There is no evidence of pericardial effusion. Mitral Valve: The mitral valve is normal in structure. Severe mitral annular calcification. Mild mitral valve regurgitation. No evidence of mitral valve stenosis.  Tricuspid Valve: The tricuspid valve is normal in structure. Tricuspid valve regurgitation is trivial. No evidence of tricuspid stenosis. Aortic Valve: The aortic valve is tricuspid. Aortic valve regurgitation is trivial. Aortic regurgitation PHT measures 305 msec. Mild aortic stenosis is present. Aortic valve mean gradient measures 9.0 mmHg. Aortic valve peak gradient measures 17.1 mmHg. Aortic valve area, by VTI measures 1.95 cm. Pulmonic Valve: The pulmonic valve was not well visualized. Pulmonic valve regurgitation is not visualized. No evidence of pulmonic stenosis. Aorta: The aortic root is normal in size and structure. Additional Comments: Limited study; not all views obtained; normal LV function; mild concentric LVH and severe proximal septal thickening; chordal SAM; peak LVOT gradient 3.5 m/s at rest. A device lead is visualized. Spectral Doppler performed. Color Doppler performed.  LEFT VENTRICLE PLAX 2D LVIDd:         2.20 cm LVIDs:         1.40 cm LV PW:         1.80 cm LV IVS:        1.40 cm LVOT diam:     1.90 cm LV SV:  89 LV SV Index:   63 LVOT Area:     2.84 cm  LEFT ATRIUM         Index LA diam:    2.30 cm 1.62 cm/m  AORTIC VALVE AV Area (Vmax):    2.15 cm AV Area (Vmean):   2.18 cm AV Area (VTI):     1.95 cm AV Vmax:           207.00 cm/s AV Vmean:          134.000 cm/s AV VTI:            0.456 m AV Peak Grad:      17.1 mmHg AV Mean Grad:      9.0 mmHg LVOT Vmax:         157.00 cm/s LVOT Vmean:        103.000 cm/s LVOT VTI:          0.314 m LVOT/AV VTI ratio: 0.69 AI PHT:            305 msec  AORTA Ao Root diam: 3.20 cm MITRAL VALVE                TRICUSPID VALVE MV Area (PHT): 2.73 cm     TR Peak grad:   23.0 mmHg MV Decel Time: 278 msec     TR Vmax:        240.00 cm/s MR Peak grad: 47.6 mmHg MR Vmax:      345.00 cm/s   SHUNTS MV E velocity: 68.80 cm/s   Systemic VTI:  0.31 m MV A velocity: 102.00 cm/s  Systemic Diam: 1.90 cm MV E/A ratio:  0.67 Kirk Ruths MD Electronically  signed by Kirk Ruths MD Signature Date/Time: 10/15/2022/11:53:33 AM    Final    IR THORACENTESIS ASP PLEURAL SPACE W/IMG GUIDE  Result Date: 10/14/2022 INDICATION: Patient with history of CKD presents with shortness of breath, previous CT showed right pleural effusion. Request for therapeutic and diagnostic thoracentesis. EXAM: ULTRASOUND GUIDED RIGHT THORACENTESIS MEDICATIONS: 5 mL 1% lidocaine COMPLICATIONS: None immediate. PROCEDURE: An ultrasound guided thoracentesis was thoroughly discussed with the patient's daughter and questions answered. The benefits, risks, alternatives and complications were also discussed. The patient's daughter understands and wishes to proceed with the procedure. Written consent was obtained. Ultrasound was performed to localize and mark an adequate pocket of fluid in the right chest. The area was then prepped and draped in the normal sterile fashion. 1% Lidocaine was used for local anesthesia. Under ultrasound guidance a 6 Fr Safe-T-Centesis catheter was introduced. Thoracentesis was performed. The catheter was removed and a dressing applied. FINDINGS: A total of approximately 1.2 L of hazy amber fluid was removed. Samples were sent to the laboratory as requested by the clinical team. Post procedure chest X-ray reviewed, negative for pneumothorax. IMPRESSION: Successful ultrasound guided right thoracentesis yielding 1.2 L of pleural fluid. Read by: Durenda Guthrie, PA-C Electronically Signed   By: Jerilynn Mages.  Shick M.D.   On: 10/14/2022 15:34   DG Chest 1 View  Result Date: 10/14/2022 CLINICAL DATA:  Status post right thoracentesis. EXAM: CHEST  1 VIEW COMPARISON:  October 13, 2022. FINDINGS: No pneumothorax status post right-sided thoracentesis. Right pleural effusion is nearly resolved. IMPRESSION: No pneumothorax status post right-sided thoracentesis. Electronically Signed   By: Marijo Conception M.D.   On: 10/14/2022 13:02     LOS: 2 days   Antonieta Pert, MD [facility]  10/16/2022, 11:51 AM

## 2022-10-17 DIAGNOSIS — J189 Pneumonia, unspecified organism: Secondary | ICD-10-CM | POA: Diagnosis not present

## 2022-10-17 DIAGNOSIS — Z515 Encounter for palliative care: Secondary | ICD-10-CM | POA: Diagnosis not present

## 2022-10-17 DIAGNOSIS — G9341 Metabolic encephalopathy: Secondary | ICD-10-CM | POA: Diagnosis not present

## 2022-10-17 DIAGNOSIS — J90 Pleural effusion, not elsewhere classified: Secondary | ICD-10-CM | POA: Diagnosis not present

## 2022-10-17 LAB — BASIC METABOLIC PANEL
Anion gap: 12 (ref 5–15)
Anion gap: 6 (ref 5–15)
BUN: 24 mg/dL — ABNORMAL HIGH (ref 8–23)
BUN: 21 mg/dL (ref 8–23)
CO2: 19 mmol/L — ABNORMAL LOW (ref 22–32)
CO2: 24 mmol/L (ref 22–32)
Calcium: 9.5 mg/dL (ref 8.9–10.3)
Calcium: 9.1 mg/dL (ref 8.9–10.3)
Chloride: 112 mmol/L — ABNORMAL HIGH (ref 98–111)
Chloride: 110 mmol/L (ref 98–111)
Creatinine, Ser: 0.94 mg/dL (ref 0.44–1.00)
Creatinine, Ser: 0.87 mg/dL (ref 0.44–1.00)
GFR, Estimated: 56 mL/min — ABNORMAL LOW (ref >60)
GFR, Estimated: >60 mL/min (ref >60)
Glucose, Bld: 97 mg/dL (ref 70–99)
Glucose, Bld: 138 mg/dL — ABNORMAL HIGH (ref 70–99)
Potassium: 5.5 mmol/L — ABNORMAL HIGH (ref 3.5–5.1)
Potassium: 4 mmol/L (ref 3.5–5.1)
Sodium: 143 mmol/L (ref 135–145)
Sodium: 140 mmol/L (ref 135–145)

## 2022-10-17 LAB — CBC
HCT: 44.3 % (ref 36.0–46.0)
Hemoglobin: 13.9 g/dL (ref 12.0–15.0)
MCH: 31.7 pg (ref 26.0–34.0)
MCHC: 31.4 g/dL (ref 30.0–36.0)
MCV: 101.1 fL — ABNORMAL HIGH (ref 80.0–100.0)
Platelets: 212 10*3/uL (ref 150–400)
RBC: 4.38 MIL/uL (ref 3.87–5.11)
RDW: 14.4 % (ref 11.5–15.5)
WBC: 8.3 10*3/uL (ref 4.0–10.5)
nRBC: 0 % (ref 0.0–0.2)

## 2022-10-17 NOTE — Progress Notes (Signed)
Daily Progress Note   Patient Name: Julie Bass       Date: 10/17/2022 DOB: Oct 28, 1926  Age: 88 y.o. MRN#: [id number] Attending Physician: Antonieta Pert, MD Primary Care Physician: Denita Lung, MD Admit Date: 10/13/2022  Reason for Consultation/Follow-up: Establishing goals of care  Subjective: Chart review performed. Received report from primary RN - no acute concerns. Reports patient is lethargic.  Went to visit patient at bedside - no family/visitors present. Patient was lying in bed asleep - I did not attempt to wake her. No signs or non-verbal gestures of pain or discomfort noted. No respiratory distress, increased work of breathing, or secretions noted. She is frail appearing.  HoP evaluation pending. Per documentation, daughter now interested in [facility] - this evaluation is pending as well.   Length of Stay: 3  Current Medications: Scheduled Meds:   amLODipine  5 mg Oral QPM   cilostazol  50 mg Oral BID WC   dorzolamide-timolol  1 drop Both Eyes BID   ezetimibe  10 mg Oral Daily   feeding supplement  237 mL Oral BID BM   hydrALAZINE  75 mg Oral BID   latanoprost  1 drop Both Eyes BID   metoprolol succinate  100 mg Oral Q supper   mirtazapine  15 mg Oral QHS   multivitamin with minerals  1 tablet Oral Daily    Continuous Infusions:  dextrose 30 mL/hr at 10/17/22 1001    PRN Meds: acetaminophen **OR** acetaminophen, hydrALAZINE, melatonin  Physical Exam Vitals and nursing note reviewed.  Constitutional:      General: She is not in acute distress.    Appearance: She is ill-appearing.  Pulmonary:     Effort: No respiratory distress.  Skin:    General: Skin is warm and dry.  Neurological:     Mental Status: She is lethargic.     Motor: Weakness present.              Vital Signs: BP (!) 155/76 (BP Location: Left Arm)   Pulse 68   Temp 98.2 F (36.8 C)   Resp 16   Wt 43.1 kg   SpO2 97%   BMI 16.31 kg/m  SpO2: SpO2: 97 % O2 Device: O2 Device: Room Air O2 Flow Rate: O2 Flow Rate (L/min): 3 L/min  Intake/output summary:  Intake/Output Summary (Last 24 hours) at 10/17/2022 1100 Last data filed at 10/17/2022 0700 Gross per 24 hour  Intake 1100.2 ml  Output 450 ml  Net 650.2 ml   LBM:   Baseline Weight: Weight: 42.9 kg Most recent weight: Weight: 43.1 kg       Palliative Assessment/Data: PPS 20%      Patient Active Problem List   Diagnosis Date Noted   CAP (community acquired pneumonia) 10/14/2022   Pleural effusion on right 10/14/2022   Acute respiratory failure with hypoxia ([place]) 10/14/2022   Hypercalcemia 10/14/2022   Chronic diastolic CHF (congestive heart failure) ([place]) 10/14/2022   UTI (urinary tract infection) 05/26/2022   Dementia without behavioral disturbance ([place]) 05/26/2022   Encephalopathy acute 05/25/2022   Submandibular gland mass 54/56/2563   Acute metabolic encephalopathy 89/37/3428   Lactic acidosis 05/29/2020   Hypertensive urgency 05/29/2020   Hiatal hernia 09/18/2019   Peripheral artery disease ([place]) 09/18/2019   Acute renal failure superimposed on stage 3a chronic kidney disease ([place]) 09/18/2019   Essential hypertension 09/18/2019   Neck mass 09/18/2019   Weakness 09/17/2019   Pacemaker 05/28/2019   Bilateral sensorineural hearing loss 05/09/2018   CHB (complete heart block) ([place]) 01/20/2018   OSA (obstructive sleep apnea) 01/20/2018   Stokes-Adams syncope 01/09/2018   GERD without esophagitis 01/08/2018   CKD (chronic kidney disease), stage III 01/07/2018   Lower urinary tract infectious disease 06/16/2016   Warthin's tumor 01/01/2016   Dementia ([place]) 03/14/2015   Orthostatic hypotension 08/27/2014   Physical deconditioning 08/27/2014   Abdominal pain in female 08/27/2014    Osteoarthrosis, unspecified whether generalized or localized, shoulder region 01/26/2013   Chest wall pain 12/26/2012   NSVT (nonsustained ventricular tachycardia) ([place]) 10/18/2011   HTN (hypertension) 10/18/2011   HLD (hyperlipidemia) 10/18/2011   PAD (peripheral artery disease) ([place]) 10/18/2011   Syncope 09/30/2011   Pelvic joint pain 09/30/2011   Back pain 09/30/2011    Palliative Care Assessment & Plan   Patient Profile: 88 y.o. female  with past medical history of dementia, essential hypertension, hyperlipidemia, chronic diastolic heart failure, and CKD presented to ED on 10/13/22 from home with family concerns of mental status changes. Patient was admitted on 10/13/2022 with acute metabolic encephalopathy, large right pleural effusion with extensive right pulmonary collapse/possible pneumonia, acute hypoxic respiratory failure, hypercalcemia.    Of note, patient was admitted 05/24/22-05/28/22 with acute metabolic encephalopathy and hypertensive urgency - PMT was consulted for GOC at that time and family opted for patient's discharge with hospice services. Per chart review, patient was admitted under hospice services; however, improved to where she was discharged. Patient is now enrolled in outpatient Palliative Care services with [facility].   Assessment: Principal Problem:   Acute metabolic encephalopathy Active Problems:   HLD (hyperlipidemia)   Essential hypertension   CAP (community acquired pneumonia)   Pleural effusion on right   Acute respiratory failure with hypoxia (HCC)   Hypercalcemia   Chronic diastolic CHF (congestive heart failure) ([place])   Concern about end of life  Recommendations/Plan: Continue gentle supportive treatment for medical optimization Continue DNR/DNI as previously documented Residential hospice evaluation pending with [facility] and [facility]. If not approved, daughter open to home hospice - TOC following If able, obtain  copy of HCPOA PMT will continue to follow and support holistically  Goals of Care and Additional Recommendations: Limitations on Scope of Treatment: Avoid Hospitalization, Minimize Medications, Full Scope Treatment, and No Tracheostomy  Code Status:  Code Status Orders  (From admission, onward)           Start     Ordered   10/14/22 1054  Do not attempt resuscitation (DNR)  Continuous       Question Answer Comment  In the event of cardiac or respiratory ARREST Do not call a "code blue"   In the event of cardiac or respiratory ARREST Do not perform Intubation, CPR, defibrillation or ACLS   In the event of cardiac or respiratory ARREST Use medication by any route, position, wound care, and other measures to relive pain and suffering. May use oxygen, suction and manual treatment of airway obstruction as needed for comfort.      10/14/22 1053           Code Status History     Date Active Date Inactive Code Status Order ID Comments User Context   10/14/2022 0151 10/14/2022 1053 Full Code [id number]  Rhetta Mura, DO ED   05/25/2022 0634 05/28/2022 1709 DNR [id number]  Clance Boll, MD ED   05/29/2020 0659 06/01/2020 2013 DNR [id number]  Vernelle Emerald, MD ED   09/17/2019 0318 09/18/2019 2034 Full Code [id number]  Jani Gravel, MD ED   01/08/2018 0011 01/10/2018 2024 Full Code [id number]  Ivor Costa, MD ED   06/16/2016 1755 06/18/2016 2137 Full Code [id number]  Cristal Ford, DO Inpatient   08/23/2014 1950 08/27/2014 2150 Full Code [id number]  Almyra Deforest, [place] Inpatient   01/26/2013 1250 01/28/2013 1643 Full Code [id number]  Augustin Schooling, MD Inpatient   10/18/2011 2201 10/20/2011 2046 Full Code [id number]  Cecilie Kicks, NP Inpatient       Prognosis:  Poor in the setting of advanced age, advanced dementia, recurrent hospitalizations, poor oral intake, and multiple comorbidities   Discharge Planning: To Be Determined  Care plan was discussed with primary RN  Thank you for  allowing the Palliative Medicine Team to assist in the care of this patient.   Total Time 25 minutes Prolonged Time Billed  no       Greater than 50%  of this time was spent counseling and coordinating care related to the above assessment and plan.  Lin Landsman, NP  Please contact Palliative Medicine Team phone at [phone] for questions and concerns.   *Portions of this note are a verbal dictation therefore any spelling and/or grammatical errors are due to the "[place] [employer]" system interpretation.

## 2022-10-17 NOTE — Progress Notes (Addendum)
Pt active with [facility]) for palliative care services. Per palliative care APP, pt's dtr requesting pt be evaluated for inpatient hospice home vs hospice services at home. Updated Frankie with [facility] who will evaluate pt and f/u with pt's dtr to determine most appropriate plan.    UPDATE 1330: spoke to pt's dtr [name] who reports she prefers [facility]) for hospice services if pt meets their criteria as she would want a [place] location for residential hospice home placement if needed in the future. If pt does not meet [facility] criteria for hospice services, then dtr prefers to stay with [facility]. Referral made to Sarah with [facility]. Will f/u with updates as available.   Wandra Feinstein, MSW, LCSW [phone] (coverage)

## 2022-10-17 NOTE — Plan of Care (Signed)

## 2022-10-17 NOTE — Progress Notes (Signed)
PROGRESS NOTE Julie Bass  IYM:[id number] DOB: 1926/08/08 DOA: 10/13/2022 PCP: Denita Lung, MD   Brief Narrative/Hospital Course: 88 year old female with dementia, CKD stage IIIa, GERD, hypertension, NSVT, PAD brought by EMS from home for "stroke", described as increased weakness x1 week, has been having a mental status for 2 days.  At baseline oriented to self but has been less communicative, somnolent than baseline.  Has had recurrent UTI in the past. Last admission:7/10 to 7/68 with metabolic encephalopathy UTI and discharged on home hospice. 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    Subjective: Seen and examined this morning discussed with nursing staff patient reportedly ate well with assistance some 25% - 75%  Alert awake, hard of hearing Overnight afebrile, currently on room air Labs shows potassium on higher side repeat lab ordered Blood pressure fairly stable in 130s to 150s\ No family at the bedside.  Assessment and Plan: Principal Problem:   Acute metabolic encephalopathy Active Problems:   HLD (hyperlipidemia)   Essential hypertension   CAP (community acquired pneumonia)   Pleural effusion on right   Acute respiratory failure with hypoxia (HCC)    Hypercalcemia   Chronic diastolic CHF (congestive heart failure) (HCC)  Acute metabolic encephalopathy Dementia: hard of hearing.  Mentation is alert awake.has had extensive work-up as above> no UTI, repeat chest x-ray after thoracentesis does not show evidence of pneumonia:Antibiotic discontinued.continue gentle hydration supportive care fall precaution    Large right pleural effusion with extensive right pulmonary collapse: No fever procalcitonin reassuring pleural fluid with no growth in the Gram stain and culture -ill discontinued antibiotics> doing well afebrile.  On room air.Source of pleural effusion is unclear could be in the setting of hypoalbuminemia, cardiology suggesting conservative management palliative care has been consulted. Cytology pending, Gram stain unremarkable, LDH 126  Hyponatremia: Improved with D5 water   Acute hypoxic respiratory failure in the setting of pleural effusion:stable   Hypokalemia repleted Hyperkalemia recheck Recent Labs  Lab 10/13/22 2145 10/14/22 0410 10/15/22 0814 10/16/22 0222 10/17/22 0153  K 3.8 3.6 3.4* 4.2 5.5*    Essential hypertension:Dr. Claiborne Billings has been managing hypertension and recently blood pressure medication were adjusted.BP now fairly controlled continue current amlodipine 5 mg, hydralazine 75 mg twice daily, metoprolol 100 mg daily  Chronic diastolic CHF: Echo reviewed - normal LV function mild concentric LVH and severe proximal septal thickening EF 65-70%.  Needing hydration volume status dry side  Hyperlipidemia : on zetia   Goals of care : Previously was enrolled in hospice, palliative care has seen the patient overall prognosis not bright given her age dementia likely with failure to thrive.  Evaluating for inpatient hospice if not approved potentially home with hospice.   Appreciate palliative care input.  Poor oral intake, speech has been consulted> continue current diet Increase nutritional limit BMI 16: dietitian  consulted Nutrition Problem: Increased nutrient needs Etiology: acute illness Signs/Symptoms: estimated needs Interventions:  Ensure Enlive (each supplement provides 350kcal and 20 grams of protein), Magic cup, MVI  Pressure ulcer stage II sacrum POA see below: Pressure Injury 10/14/22 Sacrum Stage 2 -  Partial thickness loss of dermis presenting as a shallow open injury with a red, pink wound bed without slough. (Active)  10/14/22 2130  Location: Sacrum  Location Orientation:   Staging: Stage 2 -  Partial thickness loss of dermis presenting as a shallow open injury with a red, pink wound bed without slough.  Wound Description (Comments):   Present on Admission: Yes  Dressing Type Foam - Lift dressing to assess site every shift 10/14/22 2325   DVT prophylaxis: SCDs Start: 10/14/22 0151 Code Status:   Code Status: DNR Family Communication: plan of care discussed with patient.daughter was updated previously, palliative care has discussed.  Awaiting hospice evaluation  Patient status is: inpatient because of ongoing encephalopathy Level of care: Telemetry Medical  Dispo: The patient is from: Home Anticipated disposition:  palliative care has discussed.  Awaiting hospice evaluation  Objective: Vitals last 24 hrs: Vitals:   10/16/22 1552 10/16/22 2137 10/17/22 0510 10/17/22 0926  BP: 138/63 (!) 129/57 (!) 148/64 (!) 155/76  Pulse: 72 71 66 68  Resp: '16 15 16 16  '$ Temp: 97.8 F (36.6 C) 98.5 F (36.9 C) 98.5 F (36.9 C) 98.2 F (36.8 C)  TempSrc: Oral     SpO2: 97% 95% 95% 97%  Weight:       Weight change:   Physical Examination: General exam: alert awake oriented to self, elderly frail hard of hearing HEENT:Oral mucosa moist, Ear/Nose WNL grossly Respiratory system: Bilaterally clear BS,no use of accessory muscle Cardiovascular system: S1 & S2 +, No JVD. Gastrointestinal system: Abdomen soft,NT,ND, BS+ Nervous System: Alert, awake, moving extremities Extremities: LE edema  neg,distal peripheral pulses palpable.  Skin: No rashes,no icterus. MSK: Normal muscle bulk,tone, power  Medications reviewed:  Scheduled Meds:  amLODipine  5 mg Oral QPM   cilostazol  50 mg Oral BID WC   dorzolamide-timolol  1 drop Both Eyes BID   ezetimibe  10 mg Oral Daily   feeding supplement  237 mL Oral BID BM   hydrALAZINE  75 mg Oral BID   latanoprost  1 drop Both Eyes BID   metoprolol succinate  100 mg Oral Q supper   mirtazapine  15 mg Oral QHS   multivitamin with minerals  1 tablet Oral Daily  ontinuous Infusions:  dextrose 30 mL/hr at 10/17/22 1001   Diet Order             DIET - DYS 1 Room service appropriate? Yes; Fluid consistency: Thin  Diet effective now                  Intake/Output Summary (Last 24 hours) at 10/17/2022 1135 Last data filed at 10/17/2022 0700 Gross per 24 hour  Intake 1100.2 ml  Output 450 ml  Net 650.2 ml    Net IO Since Admission: 1,936.09 mL [10/17/22 1135]  Wt Readings from Last 3 Encounters:  10/16/22 43.1 kg  05/03/22 46.4 kg  03/23/21 48.7 kg    Unresulted Labs (From admission, onward)     Start     Ordered   10/17/22 6734  Basic metabolic panel  Once,   R        10/17/22 0621   10/17/22 0500  CBC  Once,   R        10/17/22 0500   10/16/22 0500  Basic  metabolic panel  Daily at 5am,   R      10/15/22 0933   10/16/22 0500  CBC  Daily at 5am,   R      10/15/22 0933          Data Reviewed: I have personally reviewed following labs and imaging studies CBC: Recent Labs  Lab 10/13/22 2145 10/14/22 0410 10/16/22 0222  WBC 8.0 7.7 6.9  NEUTROABS 5.2 5.0  --   HGB 14.6 13.9 15.1*  HCT 45.0 43.5 47.7*  MCV 98.9 100.0 99.0  PLT 237 233 PLATELET CLUMPS NOTED ON SMEAR, UNABLE TO ESTIMATE    Basic Metabolic Panel: Recent Labs  Lab 10/13/22 2145 10/14/22 0410 10/15/22 0814 10/16/22 0222 10/17/22 0153  NA 146* 144 142 146* 143  K 3.8 3.6 3.4* 4.2 5.5*  CL 110 110 113* 114* 112*  CO2 '25 24 23 '$ 20* 19*   GLUCOSE 118* 108* 122* 100* 97  BUN '18 16 12 15 '$ 24*  CREATININE 0.94 0.88 0.74 0.85 0.94  CALCIUM 9.5 9.4 9.2 9.9 9.5  MG  --  2.1  --   --   --   PHOS  --  2.2*  --   --   --     GFR: CrCl cannot be calculated (Unknown ideal weight.). Liver Function Tests: Recent Labs  Lab 10/13/22 2145 10/14/22 0410  AST 34 36  ALT 33 33  ALKPHOS 49 46  BILITOT 0.9 0.7  PROT 6.5 6.2*  ALBUMIN 2.8* 2.8*   Coagulation Profile: Recent Labs  Lab 10/14/22 0650  INR 1.1    Thyroid Function Tests: No results for input(s): "TSH", "T4TOTAL", "FREET4", "T3FREE", "THYROIDAB" in the last 72 hours.  Sepsis Labs: Recent Labs  Lab 10/14/22 0410  PROCALCITON <0.10     Recent Results (from the past 240 hour(s))  Resp Panel by RT-PCR (Flu A&B, Covid) Anterior Nasal Swab     Status: None   Collection Time: 10/13/22 10:55 PM   Specimen: Anterior Nasal Swab  Result Value Ref Range Status   SARS Coronavirus 2 by RT PCR NEGATIVE NEGATIVE Final    Comment: (NOTE) SARS-CoV-2 target nucleic acids are NOT DETECTED.  The SARS-CoV-2 RNA is generally detectable in upper respiratory specimens during the acute phase of infection. The lowest concentration of SARS-CoV-2 viral copies this assay can detect is 138 copies/mL. A negative result does not preclude SARS-Cov-2 infection and should not be used as the sole basis for treatment or other patient management decisions. A negative result may occur with  improper specimen collection/handling, submission of specimen other than nasopharyngeal swab, presence of viral mutation(s) within the areas targeted by this assay, and inadequate number of viral copies(<138 copies/mL). A negative result must be combined with clinical observations, patient history, and epidemiological information. The expected result is Negative.  Fact Sheet for Patients:  EntrepreneurPulse.com.au  Fact Sheet for Healthcare Providers:   IncredibleEmployment.be  This test is no t yet approved or cleared by the [place] FDA and  has been authorized for detection and/or diagnosis of SARS-CoV-2 by FDA under an Emergency Use Authorization (EUA). This EUA will remain  in effect (meaning this test can be used) for the duration of the COVID-19 declaration under Section 564(b)(1) of the Act, 21 U.S.C.section 360bbb-3(b)(1), unless the authorization is terminated  or revoked sooner.       Influenza A by PCR NEGATIVE NEGATIVE Final   Influenza B by PCR NEGATIVE NEGATIVE Final    Comment: (NOTE) The Xpert  Xpress SARS-CoV-2/FLU/RSV plus assay is intended as an aid in the diagnosis of influenza from Nasopharyngeal swab specimens and should not be used as a sole basis for treatment. Nasal washings and aspirates are unacceptable for Xpert Xpress SARS-CoV-2/FLU/RSV testing.  Fact Sheet for Patients: EntrepreneurPulse.com.au  Fact Sheet for Healthcare Providers: IncredibleEmployment.be  This test is not yet approved or cleared by the [place] FDA and has been authorized for detection and/or diagnosis of SARS-CoV-2 by FDA under an Emergency Use Authorization (EUA). This EUA will remain in effect (meaning this test can be used) for the duration of the COVID-19 declaration under Section 564(b)(1) of the Act, 21 U.S.C. section 360bbb-3(b)(1), unless the authorization is terminated or revoked.  Performed at [facility], [place] [address], [place], [place] [address]   Gram stain     Status: None   Collection Time: 10/14/22  1:21 PM   Specimen: Lung, Right; Pleural Fluid  Result Value Ref Range Status   Specimen Description LUNG  Final   Special Requests NONE  Final   Gram Stain   Final    WBC PRESENT,BOTH PMN AND MONONUCLEAR NO ORGANISMS SEEN CYTOSPIN SMEAR Performed at [facility], [place] [address], [place], [place] [address]    Report Status  10/14/2022 FINAL  Final  Culture, body fluid w Gram Stain-bottle     Status: None (Preliminary result)   Collection Time: 10/14/22  1:21 PM   Specimen: Pleura  Result Value Ref Range Status   Specimen Description PLEURAL  Final   Special Requests RIGHT  Final   Culture   Final    NO GROWTH 3 DAYS Performed at [facility] [address], [place], [place] [address]    Report Status PENDING  Incomplete    Antimicrobials: Anti-infectives (From admission, onward)    Start     Dose/Rate Route Frequency Ordered Stop   10/14/22 2200  cefTRIAXone (ROCEPHIN) 1 g in sodium chloride 0.9 % 100 mL IVPB  Status:  Discontinued        1 g 200 mL/hr over 30 Minutes Intravenous Every 24 hours 10/14/22 0153 10/16/22 0730   10/14/22 2200  azithromycin (ZITHROMAX) 500 mg in sodium chloride 0.9 % 250 mL IVPB  Status:  Discontinued        500 mg 250 mL/hr over 60 Minutes Intravenous Every 24 hours 10/14/22 0153 10/16/22 0730   10/14/22 0130  cefTRIAXone (ROCEPHIN) 1 g in sodium chloride 0.9 % 100 mL IVPB        1 g 200 mL/hr over 30 Minutes Intravenous  Once 10/14/22 0118 10/14/22 0215   10/14/22 0130  azithromycin (ZITHROMAX) 500 mg in sodium chloride 0.9 % 250 mL IVPB        500 mg 250 mL/hr over 60 Minutes Intravenous  Once 10/14/22 0118 10/14/22 0322      Culture/Microbiology    Component Value Date/Time   SDES LUNG 10/14/2022 [place] 10/14/2022 1321   SPECREQUEST NONE 10/14/2022 1321   SPECREQUEST RIGHT 10/14/2022 1321   CULT  10/14/2022 1321    NO GROWTH 3 DAYS Performed at [facility], [place] [address] [place], [place] [address]    REPTSTATUS 10/14/2022 FINAL 10/14/2022 1321   REPTSTATUS PENDING 10/14/2022 1321    Other culture-see note  Radiology Studies: No results found.   LOS: 3 days   Antonieta Pert, MD [facility]  10/17/2022, 11:35 AM

## 2022-10-18 DIAGNOSIS — G9341 Metabolic encephalopathy: Secondary | ICD-10-CM | POA: Diagnosis not present

## 2022-10-18 LAB — CBC
HCT: 44.3 % (ref 36.0–46.0)
Hemoglobin: 14 g/dL (ref 12.0–15.0)
MCH: 31.7 pg (ref 26.0–34.0)
MCHC: 31.6 g/dL (ref 30.0–36.0)
MCV: 100.2 fL — ABNORMAL HIGH (ref 80.0–100.0)
Platelets: 208 10*3/uL (ref 150–400)
RBC: 4.42 MIL/uL (ref 3.87–5.11)
RDW: 14.2 % (ref 11.5–15.5)
WBC: 7.7 10*3/uL (ref 4.0–10.5)
nRBC: 0 % (ref 0.0–0.2)

## 2022-10-18 LAB — BASIC METABOLIC PANEL
Anion gap: 8 (ref 5–15)
BUN: 17 mg/dL (ref 8–23)
CO2: 22 mmol/L (ref 22–32)
Calcium: 9.3 mg/dL (ref 8.9–10.3)
Chloride: 110 mmol/L (ref 98–111)
Creatinine, Ser: 0.87 mg/dL (ref 0.44–1.00)
GFR, Estimated: >60 mL/min (ref >60)
Glucose, Bld: 109 mg/dL — ABNORMAL HIGH (ref 70–99)
Potassium: 4 mmol/L (ref 3.5–5.1)
Sodium: 140 mmol/L (ref 135–145)

## 2022-10-18 LAB — CYTOLOGY - NON PAP

## 2022-10-18 NOTE — Care Management Important Message (Signed)
Important Message  Patient Details  Name: Julie Bass MRN: [id number] Date of Birth: [birth year]   Medicare Important Message Given:  Yes     Ikeem Cleckler 10/18/2022, 2:54 PM

## 2022-10-18 NOTE — Progress Notes (Signed)
Manufacturing engineer [facility]) Hospital Liaison Note  Plan is to discharge home with hospice once medically optimized. No DME needed.  Please call with any questions or concerns. Thank you  Roselee Nova, [facility] Liaison [phone]

## 2022-10-18 NOTE — TOC Progression Note (Signed)
Transition of Care [facility]) - Initial/Assessment Note    Patient Details  Name: Julie Bass MRN: [id number] Date of Birth: 02-Nov-1926  Transition of Care [facility]) CM/SW Contact:    Milinda Antis, [employer] Phone Number: 10/18/2022, 1:40 PM  Clinical Narrative:                 LCSW contacted [facility] and was informed that the patient does not meet criteria for admission.  Cheryl with [facility] is following up with family.        Patient Goals and CMS Choice        Expected Discharge Plan and Services                                                Prior Living Arrangements/Services                       Activities of Daily Living      Permission Sought/Granted                  Emotional Assessment              Admission diagnosis:  Pleural effusion [W97] Acute metabolic encephalopathy [L89.21] Community acquired pneumonia of right lower lobe of lung [J18.9] Patient Active Problem List   Diagnosis Date Noted   CAP (community acquired pneumonia) 10/14/2022   Pleural effusion on right 10/14/2022   Acute respiratory failure with hypoxia ([place]) 10/14/2022   Hypercalcemia 10/14/2022   Chronic diastolic CHF (congestive heart failure) ([place]) 10/14/2022   UTI (urinary tract infection) 05/26/2022   Dementia without behavioral disturbance ([place]) 05/26/2022   Encephalopathy acute 05/25/2022   Submandibular gland mass 19/41/7408   Acute metabolic encephalopathy 14/48/1856   Lactic acidosis 05/29/2020   Hypertensive urgency 05/29/2020   Hiatal hernia 09/18/2019   Peripheral artery disease ([place]) 09/18/2019   Acute renal failure superimposed on stage 3a chronic kidney disease ([place]) 09/18/2019   Essential hypertension 09/18/2019   Neck mass 09/18/2019   Weakness 09/17/2019   Pacemaker 05/28/2019   Bilateral sensorineural hearing loss 05/09/2018   CHB (complete heart block) ([place]) 01/20/2018   OSA (obstructive sleep apnea) 01/20/2018   Stokes-Adams  syncope 01/09/2018   GERD without esophagitis 01/08/2018   CKD (chronic kidney disease), stage III 01/07/2018   Lower urinary tract infectious disease 06/16/2016   Warthin's tumor 01/01/2016   Dementia ([place]) 03/14/2015   Orthostatic hypotension 08/27/2014   Physical deconditioning 08/27/2014   Abdominal pain in female 08/27/2014   Osteoarthrosis, unspecified whether generalized or localized, shoulder region 01/26/2013   Chest wall pain 12/26/2012   NSVT (nonsustained ventricular tachycardia) ([place]) 10/18/2011   HTN (hypertension) 10/18/2011   HLD (hyperlipidemia) 10/18/2011   PAD (peripheral artery disease) ([place]) 10/18/2011   Syncope 09/30/2011   Pelvic joint pain 09/30/2011   Back pain 09/30/2011   PCP:  Denita Lung, MD Pharmacy:   [id number] [id number]: [phone]: [phone]    Social Determinants of Health (SDOH) Interventions    Readmission Risk Interventions     No data to display

## 2022-10-18 NOTE — Progress Notes (Signed)
PROGRESS NOTE Julie Bass  JIR:[id number] DOB: 1926-10-19 DOA: 10/13/2022 PCP: Denita Lung, MD   Brief Narrative/Hospital Course: 88 year old female with dementia, CKD stage IIIa, GERD, hypertension, NSVT, PAD brought by EMS from home for "stroke", described as increased weakness x1 week, has been having a mental status for 2 days.  At baseline oriented to self but has been less communicative, somnolent than baseline.  Has had recurrent UTI in the past. Last admission:7/10 to 7/51 with metabolic encephalopathy UTI and discharged on home hospice. 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 Following thoracentesis pleural effusion resolved, hypoxia resolved antibiotic discontinued, seen by palliative care hospice evaluation, does not meet criteria for admission at [facility].    Subjective:  Seen and examined #10 [place] alert awake hard of hearing no complaints again with minimal oral intake as per nursing staff today.  Previously was eating.   Assessment and Plan: Principal Problem:   Acute metabolic encephalopathy Active Problems:   HLD (hyperlipidemia)   Essential hypertension   CAP (community acquired pneumonia)   Pleural effusion on right   Acute respiratory failure with  hypoxia (HCC)   Hypercalcemia   Chronic diastolic CHF (congestive heart failure) (HCC)  Acute metabolic encephalopathy Dementia: hard of hearing.  Mentation is alert awake.has had extensive work-up as above> no UTI, repeat chest x-ray after thoracentesis does not show evidence of pneumonia:Antibiotic discontinued.continue gentle hydration supportive care fall precaution, and monitor.  Continue Remeron bedtime   Large right pleural effusion with extensive right pulmonary collapse: No fever procalcitonin reassuring pleural fluid with no growth in the Gram stain and culture -ill discontinued antibiotics> doing well afebrile.  On room air.Source of pleural effusion is unclear could be in the setting of hypoalbuminemia, cardiology suggesting conservative management palliative care has been consulted. Cytology pending, Gram stain unremarkable, LDH 126  Hyponatremia: Improved with D5 water   Acute hypoxic respiratory failure in the setting of pleural effusion:stable.  Continue oxygen as needed Hypokalemia repleted Hyperkalemia recheck improved   Essential hypertension:Dr. Claiborne Billings has been managing hypertension and recently blood pressure medication were adjusted.BP appears to be overall well controlled on current meds amlodipine 5 mg, hydralazine 75 mg twice daily, metoprolol 100 mg daily  Chronic diastolic CHF: Echo reviewed - normal LV function mild concentric LVH and severe proximal septal thickening EF 65-70%.  Needing hydration volume status dry side  Hyperlipidemia : on zetia   Goals of care : Awaiting for further evaluation for hospice.  Appreciate palliative care inputs.  Poor oral intake, speech has been consulted> continue current diet Increase nutritional limit BMI 16: dietitian consulted Nutrition Problem: Increased nutrient needs Etiology: acute illness Signs/Symptoms: estimated needs Interventions: Ensure Enlive (each supplement provides 350kcal and 20 grams of protein), Magic cup,  MVI  Pressure ulcer stage II sacrum POA see below: Pressure Injury 10/14/22 Sacrum Stage 2 -  Partial thickness loss of dermis presenting as a shallow open  injury with a red, pink wound bed without slough. (Active)  10/14/22 2130  Location: Sacrum  Location Orientation:   Staging: Stage 2 -  Partial thickness loss of dermis presenting as a shallow open injury with a red, pink wound bed without slough.  Wound Description (Comments):   Present on Admission: Yes  Dressing Type Foam - Lift dressing to assess site every shift 10/14/22 2325   DVT prophylaxis: SCDs Start: 10/14/22 0151 Code Status:   Code Status: DNR Family Communication: plan of care discussed with patient.daughter was updated previously, palliative care has discussed.  Awaiting hospice evaluation  Patient status is: inpatient because of ongoing encephalopathy Level of care: Telemetry Medical  Dispo: The patient is from: Home Anticipated disposition:  TBD  Objective: Vitals last 24 hrs: Vitals:   10/17/22 1527 10/17/22 2256 10/18/22 0450 10/18/22 0845  BP: (!) 166/83 (!) 141/55 (!) 153/63 135/63  Pulse: 77 67 66 64  Resp: '16 16 16 16  '$ Temp:  98.6 F (37 C) (!) 97.4 F (36.3 C) 98.1 F (36.7 C)  TempSrc:  Oral Oral   SpO2: 96% 95% 95% 94%  Weight:       Weight change:   Physical Examination: General exam: AAOX1, elderly, frail, weak,older appearing HEENT:Oral mucosa moist, Ear/Nose WNL grossly, dentition normal. Respiratory system: bilaterally diminished BS, no use of accessory muscle Cardiovascular system: S1 & S2 +, regular rate, JVD NEG. Gastrointestinal system: Abdomen soft, NT,ND,BS+ Nervous System:Alert, awake, moving extremities and grossly nonfocal Extremities: LE ankle edema , lower extremities in fetal position  Skin: No rashes,no icterus. MSK: small and thin muscle bulk, low tone, weak power   Medications reviewed:  Scheduled Meds:  amLODipine  5 mg Oral QPM   cilostazol  50 mg Oral BID WC    dorzolamide-timolol  1 drop Both Eyes BID   ezetimibe  10 mg Oral Daily   feeding supplement  237 mL Oral BID BM   hydrALAZINE  75 mg Oral BID   latanoprost  1 drop Both Eyes BID   metoprolol succinate  100 mg Oral Q supper   mirtazapine  15 mg Oral QHS   multivitamin with minerals  1 tablet Oral Daily  ontinuous Infusions:  dextrose 30 mL/hr at 10/17/22 1001   Diet Order             DIET - DYS 1 Room service appropriate? No; Fluid consistency: Thin  Diet effective now                  Intake/Output Summary (Last 24 hours) at 10/18/2022 1405 Last data filed at 10/18/2022 1346 Gross per 24 hour  Intake 1030.14 ml  Output 0 ml  Net 1030.14 ml    Net IO Since Admission: 3,236.2 mL [10/18/22 1405]  Wt Readings from Last 3 Encounters:  10/16/22 43.1 kg  05/03/22 46.4 kg  03/23/21 48.7 kg    Unresulted Labs (From admission, onward)    None     Data Reviewed: I have personally reviewed following labs and imaging studies CBC: Recent Labs  Lab 10/13/22 2145 10/14/22 0410 10/16/22 0222 10/17/22 1644 10/18/22 0520  WBC 8.0 7.7 6.9 8.3 7.7  NEUTROABS 5.2 5.0  --   --   --   HGB 14.6 13.9 15.1* 13.9 14.0  HCT 45.0 43.5 47.7* 44.3 44.3  MCV 98.9 100.0 99.0 101.1* 100.2*  PLT 237 233 PLATELET CLUMPS NOTED ON SMEAR, UNABLE TO ESTIMATE 212 536    Basic Metabolic Panel: Recent Labs  Lab 10/14/22 0410 10/15/22 0814 10/16/22 0222 10/17/22 0153 10/17/22 2043 10/18/22 0520  NA 144 142 146* 143 140 140  K 3.6 3.4* 4.2 5.5* 4.0 4.0  CL 110 113* 114* 112* 110 110  CO2 24 23 20* 19* 24 22  GLUCOSE 108* 122* 100* 97 138* 109*  BUN '16 12 15 '$ 24* 21 17  CREATININE 0.88 0.74 0.85 0.94 0.87 0.87  CALCIUM 9.4 9.2 9.9 9.5 9.1 9.3  MG 2.1  --   --   --   --   --   PHOS 2.2*  --   --   --   --   --     GFR: CrCl cannot be calculated (Unknown ideal weight.). Liver Function Tests: Recent Labs  Lab 10/13/22 2145 10/14/22 0410  AST 34 36  ALT 33 33  ALKPHOS 49 46   BILITOT 0.9 0.7  PROT 6.5 6.2*  ALBUMIN 2.8* 2.8*   Coagulation Profile: Recent Labs  Lab 10/14/22 0650  INR 1.1    Thyroid Function Tests: No results for input(s): "TSH", "T4TOTAL", "FREET4", "T3FREE", "THYROIDAB" in the last 72 hours.  Sepsis Labs: Recent Labs  Lab 10/14/22 0410  PROCALCITON <0.10     Recent Results (from the past 240 hour(s))  Resp Panel by RT-PCR (Flu A&B, Covid) Anterior Nasal Swab     Status: None   Collection Time: 10/13/22 10:55 PM   Specimen: Anterior Nasal Swab  Result Value Ref Range Status   SARS Coronavirus 2 by RT PCR NEGATIVE NEGATIVE Final    Comment: (NOTE) SARS-CoV-2 target nucleic acids are NOT DETECTED.  The SARS-CoV-2 RNA is generally detectable in upper respiratory specimens during the acute phase of infection. The lowest concentration of SARS-CoV-2 viral copies this assay can detect is 138 copies/mL. A negative result does not preclude SARS-Cov-2 infection and should not be used as the sole basis for treatment or other patient management decisions. A negative result may occur with  improper specimen collection/handling, submission of specimen other than nasopharyngeal swab, presence of viral mutation(s) within the areas targeted by this assay, and inadequate number of viral copies(<138 copies/mL). A negative result must be combined with clinical observations, patient history, and epidemiological information. The expected result is Negative.  Fact Sheet for Patients:  EntrepreneurPulse.com.au  Fact Sheet for Healthcare Providers:  IncredibleEmployment.be  This test is no t yet approved or cleared by the [place] FDA and  has been authorized for detection and/or diagnosis of SARS-CoV-2 by FDA under an Emergency Use Authorization (EUA). This EUA will remain  in effect (meaning this test can be used) for the duration of the COVID-19 declaration under Section 564(b)(1) of the Act,  21 U.S.C.section 360bbb-3(b)(1), unless the authorization is terminated  or revoked sooner.       Influenza A by PCR NEGATIVE NEGATIVE Final   Influenza B by PCR NEGATIVE NEGATIVE Final    Comment: (NOTE) The Xpert Xpress SARS-CoV-2/FLU/RSV plus assay is intended as an aid in the diagnosis of influenza from Nasopharyngeal swab specimens and should not be used as a sole basis for treatment. Nasal washings and aspirates are unacceptable for Xpert Xpress SARS-CoV-2/FLU/RSV testing.  Fact Sheet for Patients: EntrepreneurPulse.com.au  Fact Sheet for Healthcare Providers: IncredibleEmployment.be  This test is not yet approved or cleared by the [place] FDA and has been authorized for detection and/or diagnosis of SARS-CoV-2 by FDA under an Emergency Use Authorization (EUA). This EUA will remain in effect (meaning this test can be used) for the  duration of the COVID-19 declaration under Section 564(b)(1) of the Act, 21 U.S.C. section 360bbb-3(b)(1), unless the authorization is terminated or revoked.  Performed at [facility], [place] [address], [place], [place] [address]   Gram stain     Status: None   Collection Time: 10/14/22  1:21 PM   Specimen: Lung, Right; Pleural Fluid  Result Value Ref Range Status   Specimen Description LUNG  Final   Special Requests NONE  Final   Gram Stain   Final    WBC PRESENT,BOTH PMN AND MONONUCLEAR NO ORGANISMS SEEN CYTOSPIN SMEAR Performed at [facility], [place] [address] [place], [place] [address]    Report Status 10/14/2022 FINAL  Final  Culture, body fluid w Gram Stain-bottle     Status: None (Preliminary result)   Collection Time: 10/14/22  1:21 PM   Specimen: Pleura  Result Value Ref Range Status   Specimen Description PLEURAL  Final   Special Requests RIGHT  Final   Culture   Final    NO GROWTH 4 DAYS Performed at [place] [address], [place], [place] [address]     Report Status PENDING  Incomplete    Antimicrobials: Anti-infectives (From admission, onward)    Start     Dose/Rate Route Frequency Ordered Stop   10/14/22 2200  cefTRIAXone (ROCEPHIN) 1 g in sodium chloride 0.9 % 100 mL IVPB  Status:  Discontinued        1 g 200 mL/hr over 30 Minutes Intravenous Every 24 hours 10/14/22 0153 10/16/22 0730   10/14/22 2200  azithromycin (ZITHROMAX) 500 mg in sodium chloride 0.9 % 250 mL IVPB  Status:  Discontinued        500 mg 250 mL/hr over 60 Minutes Intravenous Every 24 hours 10/14/22 0153 10/16/22 0730   10/14/22 0130  cefTRIAXone (ROCEPHIN) 1 g in sodium chloride 0.9 % 100 mL IVPB        1 g 200 mL/hr over 30 Minutes Intravenous  Once 10/14/22 0118 10/14/22 0215   10/14/22 0130  azithromycin (ZITHROMAX) 500 mg in sodium chloride 0.9 % 250 mL IVPB        500 mg 250 mL/hr over 60 Minutes Intravenous  Once 10/14/22 0118 10/14/22 0322      Culture/Microbiology    Component Value Date/Time   SDES LUNG 10/14/2022 August 10/14/2022 1321   SPECREQUEST NONE 10/14/2022 1321   SPECREQUEST RIGHT 10/14/2022 1321   CULT  10/14/2022 1321    NO GROWTH 4 DAYS Performed at [facility], [place] [address], [place], [place] [address]    REPTSTATUS 10/14/2022 FINAL 10/14/2022 1321   REPTSTATUS PENDING 10/14/2022 1321    Other culture-see note  Radiology Studies: No results found.   LOS: 4 days   Antonieta Pert, MD [facility]  10/18/2022, 2:05 PM

## 2022-10-18 NOTE — Progress Notes (Signed)
Daily Progress Note   Patient Name: Julie Bass       Date: 10/18/2022 DOB: 09/04/1926  Age: 88 y.o. MRN#: [id number] Attending Physician: Antonieta Pert, MD Primary Care Physician: Denita Lung, MD Admit Date: 10/13/2022  Reason for Consultation/Follow-up: Establishing goals of care  Subjective: Medical record reviewed including progress notes, labs, imaging.  Went to visit patient at bedside - no family/visitors present. Patient was lying in bed asleep - I did not attempt to wake her. No signs or non-verbal gestures of pain or discomfort noted. No respiratory distress, increased work of breathing, or secretions noted. She is frail appearing.  Per chart, [facility] has determined patient did not meet criteria for their facility.  HoP evaluation for residential hospice placement is pending.   Length of Stay: 4  Physical Exam Vitals and nursing note reviewed.  Constitutional:      General: She is sleeping. She is not in acute distress.    Appearance: She is ill-appearing.     Interventions: Nasal cannula in place.  Cardiovascular:     Rate and Rhythm: Normal rate.  Pulmonary:     Effort: No respiratory distress.  Skin:    General: Skin is warm and dry.  Neurological:     Motor: Weakness present.            Vital Signs: BP 135/63 (BP Location: Left Arm)   Pulse 64   Temp 98.1 F (36.7 C)   Resp 16   Wt 43.1 kg   SpO2 94%   BMI 16.31 kg/m  SpO2: SpO2: 94 % O2 Device: O2 Device: Room Air O2 Flow Rate: O2 Flow Rate (L/min): 3 L/min       Palliative Assessment/Data: PPS 20%    Palliative Care Assessment & Plan   Patient Profile: 88 y.o. female  with past medical history of dementia, essential hypertension, hyperlipidemia, chronic diastolic heart failure, and CKD  presented to ED on 10/13/22 from home with family concerns of mental status changes. Patient was admitted on 10/13/2022 with acute metabolic encephalopathy, large right pleural effusion with extensive right pulmonary collapse/possible pneumonia, acute hypoxic respiratory failure, hypercalcemia.    Of note, patient was admitted 05/24/22-05/28/22 with acute metabolic encephalopathy and hypertensive urgency - PMT was consulted for GOC at that time and family opted for patient's discharge with  hospice services. Per chart review, patient was admitted under hospice services; however, improved to where she was discharged. Patient is now enrolled in outpatient Palliative Care services with [facility].   Assessment: Principal Problem:   Acute metabolic encephalopathy Active Problems:   HLD (hyperlipidemia)   Essential hypertension   CAP (community acquired pneumonia)   Pleural effusion on right   Acute respiratory failure with hypoxia (HCC)   Hypercalcemia   Chronic diastolic CHF (congestive heart failure) ([place])   Concern about end of life  Recommendations/Plan: Continue DNR/DNI  Continue current supportive care Residential hospice evaluation pending with [facility].  Did not meet criteria for [facility]. If not approved, daughter open to home hospice - TOC following PMT will continue to follow and support holistically  Prognosis:  Poor in the setting of advanced age, advanced dementia, recurrent hospitalizations, poor oral intake, and multiple comorbidities   Discharge Planning: To Be Determined   Total time: I spent 25 minutes in the care of the patient today in the above activities and documenting the encounter.   Dorthy Cooler, PA-C Palliative Medicine Team Team phone # [phone]  Thank you for allowing the Palliative Medicine Team to assist in the care of this patient. Please utilize secure chat with additional questions, if there is no response within  30 minutes please call the above phone number.  Palliative Medicine Team providers are available by phone from 7am to 7pm daily and can be reached through the team cell phone.  Should this patient require assistance outside of these hours, please call the patient's attending physician.

## 2022-10-18 NOTE — Progress Notes (Signed)
Speech Language Pathology Treatment: Dysphagia  Patient Details Name: Julie Bass MRN: [id number] DOB: 30-Oct-1926 Today's Date: 10/18/2022 Time: 5170-0174 SLP Time Calculation (min) (ACUTE ONLY): 21 min  Assessment / Plan / Recommendation Clinical Impression  Pt seen for f/u dysphagia tx with family member in attendance (son-in-law, Braulio Conte).  Pt administered oral care with pt expelling water post intake with min verbal/tactile cues provided.  Pt consumed thin via straw and puree with mod verbal/tactile cues for eliciting swallow and to decrease oral holding/decreased bolus manipulation.  Pt without overt s/sx of aspiration during small trial with family member noting pt consumes primarily soft (puree)/thin liquids at home with cueing provided d/t increased oral holding/pocketing with foods requiring mastication.  Family in agreement with diet recommendation/strategies implemented to increase oral intake.  Pt would benefit from continued Dysphagia 1(Puree)/thin liquid diet with FULL precautions/feeding during meals to implement swallowing strategies and precautions.  ST will s/o in acute setting d/t pt tolerating current diet and education completed.     HPI HPI: Pt is a 88 yo female presenting 11/29 with acute metabolic encephalopathy, large right pleural effusion with extensive right pulmonary collapse/possible pneumonia, acute hypoxic respiratory failure, hypercalcemia. Palliative Care is following while admitted. PMH includes: dementia (oriented to self only at baseline), GERD, HH, CKD, glaucoma, HTN, HLD;BSE completed on 10/15/22 recommending Dysphagia 1/thin.  ST f/u for diet tolerance/dysphagia tx      SLP Plan  Discharge SLP treatment due to (comment)      Recommendations for follow up therapy are one component of a multi-disciplinary discharge planning process, led by the attending physician.  Recommendations may be updated based on patient status, additional functional criteria and  insurance authorization.    Recommendations  Diet recommendations: Dysphagia 1 (puree);Thin liquid Liquids provided via: Cup;Straw Medication Administration: Whole meds with puree Supervision: Full supervision/cueing for compensatory strategies Compensations: Slow rate;Small sips/bites;Minimize environmental distractions Postural Changes and/or Swallow Maneuvers: Seated upright 90 degrees                Oral Care Recommendations: Oral care BID;Staff/trained caregiver to provide oral care Follow Up Recommendations: No SLP follow up Assistance recommended at discharge: Frequent or constant Supervision/Assistance SLP Visit Diagnosis: Dysphagia, unspecified (R13.10) Plan: Discharge SLP treatment due to (comment)           Elvina Sidle, M.S., CCC-SLP  10/18/2022, 12:06 PM

## 2022-10-19 LAB — CULTURE, BODY FLUID W GRAM STAIN -BOTTLE: Culture: NO GROWTH

## 2022-10-19 MED ORDER — INFLUENZA VAC A&B SA ADJ QUAD 0.5 ML IM PRSY
0.5000 mL | PREFILLED_SYRINGE | INTRAMUSCULAR | Status: AC
  Administered 2022-10-20: 0.5 mL via INTRAMUSCULAR
  Filled 2022-10-19 (×2): qty 0.5

## 2022-10-19 MED ORDER — HYDRALAZINE HCL 25 MG PO TABS: 75.0000 mg | ORAL_TABLET | Freq: Two times a day (BID) | ORAL | 0 refills | Status: AC

## 2022-10-19 NOTE — Plan of Care (Signed)

## 2022-10-19 NOTE — Progress Notes (Signed)
Mobility Specialist Progress Note:   10/19/22 1155  Mobility  Activity Transferred from bed to chair  Level of Assistance Maximum assist, patient does 25-49% (+2)  Assistive Device None  Activity Response Tolerated well  Mobility Referral Yes  $Mobility charge 1 Mobility   RN requesting assistance to transfer pt to chair. Required maxA+2 to stand and pivot, which is baseline. Pt left with all needs met.   Nelta Numbers Mobility Specialist Please contact via SecureChat or  Rehab office at [phone]

## 2022-10-19 NOTE — TOC Transition Note (Signed)
Transition of Care [facility]) - CM/SW Discharge Note   Patient Details  Name: Julie Bass MRN: [id number] Date of Birth: 02/29/1926  Transition of Care [facility]) CM/SW Contact:  Tom-Johnson, Renea Ee, RN Phone Number: 10/19/2022, 12:37 PM   Clinical Narrative:     CM consulted for home with hospice. Patient's daughter chose [facility]. CM spoke with daughter, [name] and she states she is not ready for patient to be discharged home today. States she is getting ready to receive her home tomorrow. Declined arranging transportation, states she will transport. [name] request to check Medicaid eligibility for patient. Information sent to [employer]. CM will continue to follow as patient progresses towards discharge.          Patient Goals and CMS Choice        Discharge Placement                       Discharge Plan and Services                                     Social Determinants of Health (SDOH) Interventions     Readmission Risk Interventions     No data to display

## 2022-10-19 NOTE — Progress Notes (Signed)
   Brief PMT progress note:  Medical records reviewed including progress notes, labs.  It remains unclear whether [facility] [place] has accepted patient for residential hospice.  If not, she will go home with hospice.  Goal remains to medically optimize while admitted and continue arranging for hospice.  PMT will follow peripherally and remains available for acute needs.  Thank you for your referral and allowing PMT to assist in Mrs. Julie Bass's care.   Dorthy Cooler, [facility] Palliative Medicine Team  Team Phone # [phone]   NO CHARGE

## 2022-10-19 NOTE — Discharge Summary (Signed)
Physician Discharge Summary  Julie Bass OXB:[id number] DOB: [birth year] DOA: 10/13/2022  PCP: Denita Lung, MD  Admit date: 10/13/2022 Discharge date: 10/19/2022 Recommendations for Outpatient Follow-up:  Follow up with [facility] hospice at home  Discharge Dispo: home with hospice Discharge Condition: Stable Code Status:   Code Status: DNR Diet recommendation:  Diet Order             DIET - DYS 1 Room service appropriate? No; Fluid consistency: Thin  Diet effective now                    Brief/Interim Summary: 88 year old female with dementia, CKD stage IIIa, GERD, hypertension, NSVT, PAD brought by EMS from home for "stroke", described as increased weakness x1 week, has been having a mental status for 2 days.  At baseline oriented to self but has been less communicative, somnolent than baseline.  Has had recurrent UTI in the past. Last admission:7/10 to 6/83 with metabolic encephalopathy UTI and discharged on home hospice. 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 At this time she is  eating some blood pressure stable and doing well on room air. She will be going home w/ [facility] hospice services (new) once it is set up.   Discharge Diagnoses:  Principal Problem:   Acute metabolic encephalopathy Active Problems:   HLD (hyperlipidemia)   Essential hypertension   CAP (community acquired pneumonia)   Pleural effusion on right   Acute respiratory failure with hypoxia (HCC)   Hypercalcemia   Chronic diastolic CHF (congestive heart failure) (HCC)  Acute metabolic encephalopathy Dementia: hard of hearing.  Mentation is alert awake.has had extensive work-up as above> no UTI, repeat chest x-ray after thoracentesis does not show evidence of pneumonia:Antibiotic discontinued. CONT supportive care fall precaution, and monitor.  Continue Remeron bedtime   Large right pleural effusion with extensive right pulmonary collapse: No fever procalcitonin reassuring pleural fluid with no growth in the Gram stain and culture -ill discontinued antibiotics> doing well afebrile.  On room air.Source of pleural effusion is unclear could be in the setting of hypoalbuminemia, cardiology suggesting conservative management palliative care has been consulted. Cytology NEGATIVE for malignancy, pleural fluid culture negative,LDH 126  Hyponatremia: Improved with D5 water   Acute hypoxic respiratory failure in the setting of pleural effusion:stable.  Continue oxygen as needed Hypokalemia repleted Hyperkalemia recheck improved   Essential hypertension:Dr. Claiborne Billings has been managing hypertension and recently blood pressure medication were adjusted.BP appears to be overall well controlled on current meds amlodipine 5 mg, hydralazine 75 mg twice daily, metoprolol 100 mg daily  Chronic diastolic CHF: Echo reviewed - normal LV function mild concentric LVH and severe proximal septal thickening EF 65-70%.  Needing hydration volume status dry side  Hyperlipidemia : on zetia   Goals of  care : Awaiting for  further evaluation for hospice.  Appreciate palliative care inputs.  Poor oral intake, speech has been consulted> continue current diet Increase nutritional limit BMI 16: dietitian consulted Nutrition Problem: Increased nutrient needs Etiology: acute illness Signs/Symptoms: estimated needs Interventions: Ensure Enlive (each supplement provides 350kcal and 20 grams of protein), Magic cup, MVI  Pressure ulcer stage II sacrum POA see below: Pressure Injury 10/14/22 Sacrum Stage 2 -  Partial thickness loss of dermis presenting as a shallow open injury with a red, pink wound bed without slough. (Active)  10/14/22 2130  Location: Sacrum  Location Orientation:   Staging: Stage 2 -  Partial thickness loss of dermis presenting as a shallow open injury with a red, pink wound bed without slough.  Wound Description (Comments):   Present on Admission: Yes  Dressing Type Foam - Lift dressing to assess site every shift 10/14/22 2325     Discussed with the daughter for return home with hospice today Daughter requesting flu shot prior to discharge  Consults: PMT CARDIOLOGY Subjective: Seen and examined this morning alert awake , ate some.  Discharge Exam: Vitals:   10/19/22 0420 10/19/22 1000  BP: (!) 143/64 138/66  Pulse: 72 70  Resp: 16 18  Temp: 97.8 F (36.6 C) (!) 97.3 F (36.3 C)  SpO2: 94% 95%   General: Pt is alert, awake, not in acute distress Cardiovascular: RRR, S1/S2 +, no rubs, no gallops Respiratory: CTA bilaterally, no wheezing, no rhonchi Abdominal: Soft, NT, ND, bowel sounds + Extremities: no edema, no cyanosis  Discharge Instructions  Discharge Instructions     Discharge instructions   Complete by: As directed    Please follow-up with [facility] hospice at home.  Please call call MD or return to ER for similar or worsening recurring problem that brought you to hospital or if any fever,nausea/vomiting,abdominal pain, uncontrolled pain, chest pain,  shortness of  breath or any other alarming symptoms.  Please follow-up your doctor as instructed in a week time and call the office for appointment.  Please avoid alcohol, smoking, or any other illicit substance and maintain healthy habits including taking your regular medications as prescribed.  You were cared for by a hospitalist during your hospital stay. If you have any questions about your discharge medications or the care you received while you were in the hospital after you are discharged, you can call the unit and ask to speak with the hospitalist on call if the hospitalist that took care of you is not available.  Once you are discharged, your primary care physician will handle any further medical issues. Please note that NO REFILLS for any discharge medications will be authorized once you are discharged, as it is imperative that you return to your primary care physician (or establish a relationship with a primary care physician if you do not have one) for your aftercare needs so that they can reassess your need for medications and monitor your lab values   Discharge wound care:   Complete by: As directed    Continue foam dressing/offloading supportive care to minimize pressure on sacrum   Increase activity slowly   Complete by: As directed       Allergies as of 10/19/2022       Reactions   Crestor [rosuvastatin Calcium] Other (See Comments)   Muscle pain, myalgia   Lipitor [atorvastatin Calcium] Other (See Comments)   Muscle pain, myalgia        Medication List     TAKE  these medications    amLODipine 5 MG tablet Commonly known as: NORVASC Take 1 tablet (5 mg total) by mouth every evening. Keep appointment for further refills. What changed: additional instructions   Biotin 10000 MCG Tabs Take 10,000 mcg by mouth daily with breakfast.   cilostazol 50 MG tablet Commonly known as: PLETAL TAKE 1 TABLET BY MOUTH TWICE A DAY What changed: when to take this   citalopram 20 MG  tablet Commonly known as: CELEXA TAKE 1 TABLET BY MOUTH EVERY DAY   CRANBERRY PO Take 1 capsule by mouth 2 (two) times daily with a meal.   dorzolamide-timolol 2-0.5 % ophthalmic solution Commonly known as: COSOPT Place 1 drop into both eyes 2 (two) times daily.   ezetimibe 10 MG tablet Commonly known as: ZETIA TAKE 1 TABLET BY MOUTH EVERY DAY   feeding supplement (ENSURE COMPLETE) Liqd Take 237 mLs by mouth 3 (three) times daily between meals.   hydrALAZINE 25 MG tablet Commonly known as: APRESOLINE Take 3 tablets (75 mg total) by mouth 2 (two) times daily. What changed:  how much to take when to take this   latanoprost 0.005 % ophthalmic solution Commonly known as: XALATAN Place 1 drop into both eyes daily.   Livalo 2 MG Tabs Generic drug: Pitavastatin Calcium Take 1 tablet by mouth daily.   metoprolol succinate 100 MG 24 hr tablet Commonly known as: TOPROL-XL TAKE 1 TABLET BY MOUTH EVERY EVENING. What changed: when to take this   mirtazapine 15 MG disintegrating tablet Commonly known as: REMERON SOL-TAB TAKE 1 TABLET BY MOUTH EVERYDAY AT BEDTIME What changed: Another medication with the same name was removed. Continue taking this medication, and follow the directions you see here.   omega-3 acid ethyl esters 1 g capsule Commonly known as: LOVAZA TAKE 1 CAPSULE (1 G TOTAL) BY MOUTH EVERY EVENING. What changed: when to take this   pantoprazole 40 MG tablet Commonly known as: PROTONIX TAKE 1 TABLET (40 MG TOTAL) BY MOUTH DAILY AS NEEDED (FOR ACID REFLUX). What changed: reasons to take this               Discharge Care Instructions  (From admission, onward)           Start     Ordered   10/19/22 0000  Discharge wound care:       Comments: Continue foam dressing/offloading supportive care to minimize pressure on sacrum   10/19/22 1147            Follow-up Information     Denita Lung, MD Follow up in 1 week(s).   Specialty: Family  Medicine Contact information: [place] [address] [phone]                Allergies  Allergen Reactions   Crestor [Rosuvastatin Calcium] Other (See Comments)    Muscle pain, myalgia   Lipitor [Atorvastatin Calcium] Other (See Comments)    Muscle pain, myalgia    The results of significant diagnostics from this hospitalization (including imaging, microbiology, ancillary and laboratory) are listed below for reference.    Microbiology: Recent Results (from the past 240 hour(s))  Resp Panel by RT-PCR (Flu A&B, Covid) Anterior Nasal Swab     Status: None   Collection Time: 10/13/22 10:55 PM   Specimen: Anterior Nasal Swab  Result Value Ref Range Status   SARS Coronavirus 2 by RT PCR NEGATIVE NEGATIVE Final    Comment: (NOTE) SARS-CoV-2 target nucleic acids are NOT DETECTED.  The SARS-CoV-2 RNA is generally detectable in upper respiratory specimens during the acute phase of infection. The lowest concentration of SARS-CoV-2 viral copies this assay can detect is 138 copies/mL. A negative result does not preclude SARS-Cov-2 infection and should not be used as the sole basis for treatment or other patient management decisions. A negative result may occur with  improper specimen collection/handling, submission of specimen other than nasopharyngeal swab, presence of viral mutation(s) within the areas targeted by this assay, and inadequate number of viral copies(<138 copies/mL). A negative result must be combined with clinical observations, patient history, and epidemiological information. The expected result is Negative.  Fact Sheet for Patients:  EntrepreneurPulse.com.au  Fact Sheet for Healthcare Providers:  IncredibleEmployment.be  This test is no t yet approved or cleared by the [place] FDA and  has been authorized for detection and/or diagnosis of SARS-CoV-2 by FDA under an Emergency Use  Authorization (EUA). This EUA will remain  in effect (meaning this test can be used) for the duration of the COVID-19 declaration under Section 564(b)(1) of the Act, 21 U.S.C.section 360bbb-3(b)(1), unless the authorization is terminated  or revoked sooner.       Influenza A by PCR NEGATIVE NEGATIVE Final   Influenza B by PCR NEGATIVE NEGATIVE Final    Comment: (NOTE) The Xpert Xpress SARS-CoV-2/FLU/RSV plus assay is intended as an aid in the diagnosis of influenza from Nasopharyngeal swab specimens and should not be used as a sole basis for treatment. Nasal washings and aspirates are unacceptable for Xpert Xpress SARS-CoV-2/FLU/RSV testing.  Fact Sheet for Patients: EntrepreneurPulse.com.au  Fact Sheet for Healthcare Providers: IncredibleEmployment.be  This test is not yet approved or cleared by the [place] FDA and has been authorized for detection and/or diagnosis of SARS-CoV-2 by FDA under an Emergency Use Authorization (EUA). This EUA will remain in effect (meaning this test can be used) for the duration of the COVID-19 declaration under Section 564(b)(1) of the Act, 21 U.S.C. section 360bbb-3(b)(1), unless the authorization is terminated or revoked.  Performed at [facility], [place] [address] [place], [place] [address]   Gram stain     Status: None   Collection Time: 10/14/22  1:21 PM   Specimen: Lung, Right; Pleural Fluid  Result Value Ref Range Status   Specimen Description LUNG  Final   Special Requests NONE  Final   Gram Stain   Final    WBC PRESENT,BOTH PMN AND MONONUCLEAR NO ORGANISMS SEEN CYTOSPIN SMEAR Performed at [facility], [place] [address], [place], [place] [address]    Report Status 10/14/2022 FINAL  Final  Culture, body fluid w Gram Stain-bottle     Status: None   Collection Time: 10/14/22  1:21 PM   Specimen: Pleura  Result Value Ref Range Status   Specimen Description PLEURAL  Final   Special  Requests RIGHT  Final   Culture   Final    NO GROWTH 5 DAYS Performed at [facility] [address] [place], [place] [address]    Report Status 10/19/2022 FINAL  Final    Procedures/Studies: ECHOCARDIOGRAM LIMITED  Result Date: 10/15/2022    ECHOCARDIOGRAM LIMITED REPORT   Patient Name:   Julie Bass Date of Exam: 10/15/2022 Medical Rec #:  [id number]        Height:       64.0 in Accession #:    [id number]       Weight:       94.6 lb Date  of Birth:  1926-04-18        BSA:          1.423 m Patient Age:    88 years         BP:           176/76 mmHg Patient Gender: F                HR:           67 bpm. Exam Location:  Inpatient Procedure: Limited Echo, Cardiac Doppler and Limited Color Doppler Indications:    Congestive Heart Failure I50.9  History:        Patient has prior history of Echocardiogram examinations, most                 recent 05/25/2022. CHF, Pacemaker, PAD and AMS,                 Arrythmias:Tachycardia, Signs/Symptoms:Syncope and Chest Pain;                 Risk Factors:Hypertension, Dyslipidemia and Former Smoker.  Sonographer:    Greer Pickerel Referring Phys: [id number] [place]  Sonographer Comments: Image acquisition challenging due to patient body habitus, Image acquisition challenging due to respiratory motion and Image acquisition challenging due to uncooperative patient. IMPRESSIONS  1. Limited study; not all views obtained; normal LV function; mild concentric LVH and severe proximal septal thickening; chordal SAM; peak LVOT gradient 3.5 m/s at rest.  2. Left ventricular ejection fraction, by estimation, is 65 to 70%. The left ventricle has normal function. The left ventricle has no regional wall motion abnormalities.  3. Right ventricular systolic function is normal. The right ventricular size is normal.  4. The mitral valve is normal in structure. Mild mitral valve regurgitation. No evidence of mitral stenosis. Severe mitral annular calcification.  5. The aortic valve is  tricuspid. Aortic valve regurgitation is trivial. Mild aortic valve stenosis. FINDINGS  Left Ventricle: Left ventricular ejection fraction, by estimation, is 65 to 70%. The left ventricle has normal function. The left ventricle has no regional wall motion abnormalities. The left ventricular internal cavity size was normal in size. There is  no left ventricular hypertrophy. Right Ventricle: The right ventricular size is normal. Right ventricular systolic function is normal. Left Atrium: Left atrial size was normal in size. Right Atrium: Right atrial size was normal in size. Pericardium: There is no evidence of pericardial effusion. Mitral Valve: The mitral valve is normal in structure. Severe mitral annular calcification. Mild mitral valve regurgitation. No evidence of mitral valve stenosis. Tricuspid Valve: The tricuspid valve is normal in structure. Tricuspid valve regurgitation is trivial. No evidence of tricuspid stenosis. Aortic Valve: The aortic valve is tricuspid. Aortic valve regurgitation is trivial. Aortic regurgitation PHT measures 305 msec. Mild aortic stenosis is present. Aortic valve mean gradient measures 9.0 mmHg. Aortic valve peak gradient measures 17.1 mmHg. Aortic valve area, by VTI measures 1.95 cm. Pulmonic Valve: The pulmonic valve was not well visualized. Pulmonic valve regurgitation is not visualized. No evidence of pulmonic stenosis. Aorta: The aortic root is normal in size and structure. Additional Comments: Limited study; not all views obtained; normal LV function; mild concentric LVH and severe proximal septal thickening; chordal SAM; peak LVOT gradient 3.5 m/s at rest. A device lead is visualized. Spectral Doppler performed. Color Doppler performed.  LEFT VENTRICLE PLAX 2D LVIDd:         2.20 cm LVIDs:  1.40 cm LV PW:         1.80 cm LV IVS:        1.40 cm LVOT diam:     1.90 cm LV SV:         89 LV SV Index:   63 LVOT Area:     2.84 cm  LEFT ATRIUM         Index LA diam:    2.30  cm 1.62 cm/m  AORTIC VALVE AV Area (Vmax):    2.15 cm AV Area (Vmean):   2.18 cm AV Area (VTI):     1.95 cm AV Vmax:           207.00 cm/s AV Vmean:          134.000 cm/s AV VTI:            0.456 m AV Peak Grad:      17.1 mmHg AV Mean Grad:      9.0 mmHg LVOT Vmax:         157.00 cm/s LVOT Vmean:        103.000 cm/s LVOT VTI:          0.314 m LVOT/AV VTI ratio: 0.69 AI PHT:            305 msec  AORTA Ao Root diam: 3.20 cm MITRAL VALVE                TRICUSPID VALVE MV Area (PHT): 2.73 cm     TR Peak grad:   23.0 mmHg MV Decel Time: 278 msec     TR Vmax:        240.00 cm/s MR Peak grad: 47.6 mmHg MR Vmax:      345.00 cm/s   SHUNTS MV E velocity: 68.80 cm/s   Systemic VTI:  0.31 m MV A velocity: 102.00 cm/s  Systemic Diam: 1.90 cm MV E/A ratio:  0.67 Kirk Ruths MD Electronically signed by Kirk Ruths MD Signature Date/Time: 10/15/2022/11:53:33 AM    Final    IR THORACENTESIS ASP PLEURAL SPACE W/IMG GUIDE  Result Date: 10/14/2022 INDICATION: Patient with history of CKD presents with shortness of breath, previous CT showed right pleural effusion. Request for therapeutic and diagnostic thoracentesis. EXAM: ULTRASOUND GUIDED RIGHT THORACENTESIS MEDICATIONS: 5 mL 1% lidocaine COMPLICATIONS: None immediate. PROCEDURE: An ultrasound guided thoracentesis was thoroughly discussed with the patient's daughter and questions answered. The benefits, risks, alternatives and complications were also discussed. The patient's daughter understands and wishes to proceed with the procedure. Written consent was obtained. Ultrasound was performed to localize and mark an adequate pocket of fluid in the right chest. The area was then prepped and draped in the normal sterile fashion. 1% Lidocaine was used for local anesthesia. Under ultrasound guidance a 6 Fr Safe-T-Centesis catheter was introduced. Thoracentesis was performed. The catheter was removed and a dressing applied. FINDINGS: A total of approximately 1.2 L of hazy  amber fluid was removed. Samples were sent to the laboratory as requested by the clinical team. Post procedure chest X-ray reviewed, negative for pneumothorax. IMPRESSION: Successful ultrasound guided right thoracentesis yielding 1.2 L of pleural fluid. Read by: Durenda Guthrie, PA-C Electronically Signed   By: Jerilynn Mages.  Shick M.D.   On: 10/14/2022 15:34   DG Chest 1 View  Result Date: 10/14/2022 CLINICAL DATA:  Status post right thoracentesis. EXAM: CHEST  1 VIEW COMPARISON:  October 13, 2022. FINDINGS: No pneumothorax status post right-sided thoracentesis. Right pleural effusion is nearly resolved. IMPRESSION: No pneumothorax  status post right-sided thoracentesis. Electronically Signed   By: Marijo Conception M.D.   On: 10/14/2022 13:02   CT CHEST WO CONTRAST  Result Date: 10/14/2022 CLINICAL DATA:  Respiratory illness with nondiagnostic x-ray EXAM: CT CHEST WITHOUT CONTRAST TECHNIQUE: Multidetector CT imaging of the chest was performed following the standard protocol without IV contrast. RADIATION DOSE REDUCTION: This exam was performed according to the departmental dose-optimization program which includes automated exposure control, adjustment of the mA and/or kV according to patient size and/or use of iterative reconstruction technique. COMPARISON:  None Available. FINDINGS: Cardiovascular: Generous heart size. Dual-chamber pacer leads from the left. No pericardial effusion. Generalized atheromatous calcification. Mediastinum/Nodes: Negative for adenopathy or mass. Lungs/Pleura: Large right pleural effusion with collapse of the lower lobe and extensive upper lobe atelectasis. Collapse of left lower lobe airways but aerated left lung without consolidation or edema. No pneumothorax. Upper Abdomen: Cyst with layering numerous calculi at the upper pole left kidney, cyst measuring up to 5 cm. Cholecystectomy and atherosclerosis. Musculoskeletal: Compression fractures of T3, T6, T9, T12, L1, and L2. The most recent  appearing is at T12 where there is sclerosis and some visible fracture plane. Moderate to advanced height loss at T9, T12, L1, and L2. no retropulsion of note. 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 IMPRESSION: 1. Large low-density right pleural effusion with extensive right pulmonary collapse. 2. Multiple thoracic and lumbar compression fractures with subacute appearance at T12. 3. 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 Atherosclerosis and multiple left renal calculi. Electronically Signed   By: Jorje Guild M.D.   On: 10/14/2022 07:34   CT HEAD WO CONTRAST (5MM)  Result Date: 10/13/2022 CLINICAL DATA:  Increasing weakness for 1 week, dementia, urinary tract infection EXAM: CT HEAD WITHOUT CONTRAST TECHNIQUE: Contiguous axial images were obtained from the base of the skull through the vertex without intravenous contrast. RADIATION DOSE REDUCTION: This exam was performed according to the departmental dose-optimization program which includes automated exposure control, adjustment of the mA and/or kV according to patient size and/or use of iterative reconstruction technique. COMPARISON:  05/24/2022 FINDINGS: Brain: Diffuse cerebral atrophy. Chronic small-vessel ischemic changes throughout the periventricular white matter. No acute infarct or hemorrhage. Lateral ventricles and midline structures are unremarkable. No acute extra-axial fluid collections. No mass effect. Vascular: Diffuse atherosclerosis.  No hyperdense vessel. Skull: Normal. Negative for fracture or focal lesion. Sinuses/Orbits: No acute finding. Other: None. IMPRESSION: 1. Stable atrophy and chronic small vessel ischemic changes. No acute intracranial process. Electronically Signed   By: Randa Ngo M.D.   On: 10/13/2022 22:22   DG Chest 2 View  Result Date: 10/13/2022 CLINICAL DATA:  Weakness EXAM: CHEST - 2 VIEW COMPARISON:  Radiographs 05/24/2022 FINDINGS: New moderate right pleural effusion and associated atelectasis/consolidation. Left lung  is clear. No pneumothorax. Stable cardiomegaly. Aortic atherosclerotic calcification. Left chest wall pacemaker. IMPRESSION: New moderate right pleural effusion and associated atelectasis or pneumonia. Electronically Signed   By: Placido Sou M.D.   On: 10/13/2022 20:24   CUP PACEART REMOTE DEVICE CHECK  Result Date: 09/29/2022 Scheduled remote reviewed. Normal device function.  Next remote 91 days. LA   Labs: BNP (last 3 results) Recent Labs    05/25/22 0706 10/14/22 0410 10/15/22 0814  BNP 168.1* 226.1* 938.1*   Basic Metabolic Panel: Recent Labs  Lab 10/14/22 0410 10/15/22 0814 10/16/22 0222 10/17/22 0153 10/17/22 2043 10/18/22 0520  NA 144 142 146* 143 140 140  K 3.6 3.4* 4.2 5.5* 4.0 4.0  CL 110 113* 114*  112* 110 110  CO2 24 23 20* 19* 24 22  GLUCOSE 108* 122* 100* 97 138* 109*  BUN '16 12 15 '$ 24* 21 17  CREATININE 0.88 0.74 0.85 0.94 0.87 0.87  CALCIUM 9.4 9.2 9.9 9.5 9.1 9.3  MG 2.1  --   --   --   --   --   PHOS 2.2*  --   --   --   --   --    Liver Function Tests: Recent Labs  Lab 10/13/22 2145 10/14/22 0410  AST 34 36  ALT 33 33  ALKPHOS 49 46  BILITOT 0.9 0.7  PROT 6.5 6.2*  ALBUMIN 2.8* 2.8*   No results for input(s): "LIPASE", "AMYLASE" in the last 168 hours. No results for input(s): "AMMONIA" in the last 168 hours. CBC: Recent Labs  Lab 10/13/22 2145 10/14/22 0410 10/16/22 0222 10/17/22 1644 10/18/22 0520  WBC 8.0 7.7 6.9 8.3 7.7  NEUTROABS 5.2 5.0  --   --   --   HGB 14.6 13.9 15.1* 13.9 14.0  HCT 45.0 43.5 47.7* 44.3 44.3  MCV 98.9 100.0 99.0 101.1* 100.2*  PLT 237 233 PLATELET CLUMPS NOTED ON SMEAR, UNABLE TO ESTIMATE 212 208   Cardiac Enzymes: Recent Labs  Lab 10/14/22 0650  CKTOTAL 59  Urinalysis    Component Value Date/Time   COLORURINE YELLOW 10/14/2022 0014   APPEARANCEUR HAZY (A) 10/14/2022 0014   LABSPEC 1.014 10/14/2022 0014   LABSPEC 1.015 06/12/2020 1456   PHURINE 5.0 10/14/2022 0014   GLUCOSEU NEGATIVE  10/14/2022 0014   HGBUR NEGATIVE 10/14/2022 0014   BILIRUBINUR NEGATIVE 10/14/2022 0014   BILIRUBINUR negative 06/12/2020 1456   BILIRUBINUR n 11/25/2016 1409   KETONESUR NEGATIVE 10/14/2022 0014   PROTEINUR 30 (A) 10/14/2022 0014   UROBILINOGEN negative 11/25/2016 1409   UROBILINOGEN 1 10/08/2014 1529   NITRITE NEGATIVE 10/14/2022 0014   LEUKOCYTESUR NEGATIVE 10/14/2022 0014   Sepsis Labs Recent Labs  Lab 10/14/22 0410 10/16/22 0222 10/17/22 1644 10/18/22 0520  WBC 7.7 6.9 8.3 7.7   Microbiology Recent Results (from the past 240 hour(s))  Resp Panel by RT-PCR (Flu A&B, Covid) Anterior Nasal Swab     Status: None   Collection Time: 10/13/22 10:55 PM   Specimen: Anterior Nasal Swab  Result Value Ref Range Status   SARS Coronavirus 2 by RT PCR NEGATIVE NEGATIVE Final    Comment: (NOTE) SARS-CoV-2 target nucleic acids are NOT DETECTED.  The SARS-CoV-2 RNA is generally detectable in upper respiratory specimens during the acute phase of infection. The lowest concentration of SARS-CoV-2 viral copies this assay can detect is 138 copies/mL. A negative result does not preclude SARS-Cov-2 infection and should not be used as the sole basis for treatment or other patient management decisions. A negative result may occur with  improper specimen collection/handling, submission of specimen other than nasopharyngeal swab, presence of viral mutation(s) within the areas targeted by this assay, and inadequate number of viral copies(<138 copies/mL). A negative result must be combined with clinical observations, patient history, and epidemiological information. The expected result is Negative.  Fact Sheet for Patients:  EntrepreneurPulse.com.au  Fact Sheet for Healthcare Providers:  IncredibleEmployment.be  This test is no t yet approved or cleared by the [place] FDA and  has been authorized for detection and/or diagnosis of SARS-CoV-2 by FDA  under an Emergency Use Authorization (EUA). This EUA will remain  in effect (meaning this test can be used) for the duration of the COVID-19 declaration under  Section 564(b)(1) of the Act, 21 U.S.C.section 360bbb-3(b)(1), unless the authorization is terminated  or revoked sooner.       Influenza A by PCR NEGATIVE NEGATIVE Final   Influenza B by PCR NEGATIVE NEGATIVE Final    Comment: (NOTE) The Xpert Xpress SARS-CoV-2/FLU/RSV plus assay is intended as an aid in the diagnosis of influenza from Nasopharyngeal swab specimens and should not be used as a sole basis for treatment. Nasal washings and aspirates are unacceptable for Xpert Xpress SARS-CoV-2/FLU/RSV testing.  Fact Sheet for Patients: EntrepreneurPulse.com.au  Fact Sheet for Healthcare Providers: IncredibleEmployment.be  This test is not yet approved or cleared by the [place] FDA and has been authorized for detection and/or diagnosis of SARS-CoV-2 by FDA under an Emergency Use Authorization (EUA). This EUA will remain in effect (meaning this test can be used) for the duration of the COVID-19 declaration under Section 564(b)(1) of the Act, 21 U.S.C. section 360bbb-3(b)(1), unless the authorization is terminated or revoked.  Performed at [facility], [place] [address], [place], [place] [address]   Gram stain     Status: None   Collection Time: 10/14/22  1:21 PM   Specimen: Lung, Right; Pleural Fluid  Result Value Ref Range Status   Specimen Description LUNG  Final   Special Requests NONE  Final   Gram Stain   Final    WBC PRESENT,BOTH PMN AND MONONUCLEAR NO ORGANISMS SEEN CYTOSPIN SMEAR Performed at [facility], [place] [address], [place], [place] [address]    Report Status 10/14/2022 FINAL  Final  Culture, body fluid w Gram Stain-bottle     Status: None   Collection Time: 10/14/22  1:21 PM   Specimen: Pleura  Result Value Ref Range Status   Specimen Description  PLEURAL  Final   Special Requests RIGHT  Final   Culture   Final    NO GROWTH 5 DAYS Performed at [place] [address] [place], [place] [address]    Report Status 10/19/2022 FINAL  Final     Time coordinating discharge: 25 minutes  SIGNED: Antonieta Pert, MD  [facility] 10/19/2022, 11:51 AM  If 7PM-7AM, please contact night-coverage [email or web address]

## 2022-10-20 ENCOUNTER — Telehealth: Payer: Self-pay | Admitting: Family Medicine

## 2022-10-20 NOTE — Progress Notes (Signed)
Mobility Specialist Progress Note:   10/20/22 1200  Mobility  Activity Transferred from bed to chair  Level of Assistance +2 (takes two people)  Distance Ambulated (ft) 2 ft  Activity Response Tolerated well  Mobility Referral No  $Mobility charge 1 Mobility    RN requesting assistance with transfer from B>C. Required maxA +2, which is baseline. Left in chair, with all  needs met.     Mobility Specialist Please contact via SecureChat or  Rehab office at [phone]  

## 2022-10-20 NOTE — TOC Transition Note (Signed)
Transition of Care [facility]) - CM/SW Discharge Note   Patient Details  Name: Julie Bass MRN: [id number] Date of Birth: 21-Mar-1926  [facility] CM/SW Contact:  Tom-Johnson, Renea Ee, RN Phone Number: 10/20/2022, 1:00 PM   Clinical Narrative:     Patient is scheduled for discharge today. Discharging home under hospice care with [facility]. Son to transport at discharge. No further TOC needs noted.     Final next level of care: Home w Hospice Care Barriers to Discharge: Barriers Resolved   Patient Goals and CMS Choice Patient states their goals for this hospitalization and ongoing recovery are:: To return home with Hospice CMS Medicare.gov Compare Post Acute Care list provided to:: Patient Choice offered to / list presented to : Patient, Adult Children  Discharge Placement                Patient to be transferred to facility by: Son      Discharge Plan and Services                DME Arranged: N/A DME Agency: NA       HH Arranged: NA HH Agency: NA        Social Determinants of Health (SDOH) Interventions Food Insecurity Interventions: Intervention Not Indicated Housing Interventions: Intervention Not Indicated Transportation Interventions: Intervention Not Indicated, Inpatient TOC, Patient Resources (Friends/Family) Utilities Interventions: Intervention Not Indicated   Readmission Risk Interventions     No data to display

## 2022-10-20 NOTE — Discharge Instructions (Signed)
Patient is being discharged home with hospice

## 2022-10-20 NOTE — Telephone Encounter (Signed)
Marina from [facility] called stating family would like you to be the Hospice attending provider & wants to know if you agree to this & also needs an oral certification for terminal illness stating the patient has a life expectancy of 6 months or less if the illness runs it's normal course please call them back at #[phone]

## 2022-10-20 NOTE — Plan of Care (Signed)

## 2022-10-20 NOTE — Progress Notes (Signed)
  Julie Bass to be discharged Home per MD order. Discussed prescriptions and follow up appointments with the patient and son. Prescriptions given to patient and son; medication list explained in detail. Patient and son verbalized understanding.  Skin clean, dry and intact without evidence of skin break down, no evidence of skin tears noted. IV catheter discontinued intact. Site without signs and symptoms of complications. Dressing and pressure applied. Pt denies pain at the site currently. No complaints noted.  Patient free of lines, drains, and wounds.   An After Visit Summary (AVS) was printed and given to the patient. Patient escorted via wheelchair, and discharged home via private auto.  Anastasio Auerbach, RN

## 2022-10-20 NOTE — Progress Notes (Signed)
PROGRESS NOTE    Julie Bass  QMG:[id number] DOB: 1926/05/12 DOA: 10/13/2022 PCP: Denita Lung, MD   Brief Narrative:  This 88 year old female with dementia, CKD stage IIIa, GERD, hypertension, NSVT, PAD brought by EMS from home for "stroke", described as increased weakness x1 week, has been having a mental status for 2 days.  At baseline oriented to self but has been less communicative, somnolent than baseline.  Has had recurrent UTI in the past. Last admission:7/10 to 8/91 with metabolic encephalopathy UTI and discharged on home hospice. 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 At this time she is eating some,  blood pressure stable and doing well on room air. She will be going home w/ [facility] hospice services (new) once it is set up.    Assessment & Plan:   Principal Problem:   Acute metabolic encephalopathy Active Problems:   HLD (hyperlipidemia)   Essential hypertension   CAP (community acquired pneumonia)   Pleural effusion on right   Acute respiratory failure with  hypoxia (HCC)   Hypercalcemia   Chronic diastolic CHF (congestive heart failure) (HCC)  Acute metabolic encephalopathy Dementia: hard of hearing.  Mentation is alert,  awake .has had extensive work-up as above> no UTI, repeat chest x-ray after thoracentesis does not show evidence of pneumonia: Antibiotic discontinued. CONT supportive care fall precaution, and monitor.  Continue Remeron bedtime.   Large right pleural effusion with extensive right pulmonary collapse: No fever, procalcitonin reassuring,  pleural fluid with no growth in the Gram stain and culture -ill discontinued antibiotics> doing well afebrile.  On room air. Source of pleural effusion is unclear could be in the setting of hypoalbuminemia, cardiology suggesting conservative management,  palliative care has been consulted. Cytology NEGATIVE for malignancy, pleural fluid culture negative,LDH 126   Hyponatremia: Improved with D5 water .   Acute hypoxic respiratory failure in the setting of pleural effusion:stable.  Continue oxygen as needed Hypokalemia repleted Hyperkalemia recheck improved   Essential hypertension:Dr. Claiborne Billings has been managing hypertension and recently blood pressure medication were adjusted. BP appears to be overall,  well controlled on current meds amlodipine 5 mg, hydralazine 75 mg twice daily, metoprolol 100 mg daily   Chronic diastolic CHF: Echo reviewed - normal LV function mild concentric LVH and severe proximal septal thickening EF 65-70%.  Needing hydration volume status dry side   Hyperlipidemia : on zetia   Goals of care : Awaiting for further evaluation for hospice.  Appreciate palliative care inputs.   Poor oral intake, speech has been consulted> continue current diet.   Increase nutritional limit BMI 16: dietitian consulted Nutrition Problem: Increased nutrient needs Etiology: acute illness Signs/Symptoms: estimated needs Interventions: Ensure Enlive (each supplement provides 350kcal and 20  grams of protein), Magic cup,  MVI  Pressure ulcer stage II sacrum POA see below:     Pressure Injury 10/14/22 Sacrum Stage 2 -  Partial thickness loss of dermis presenting as a shallow open injury with a red, pink wound bed without slough. (Active)  10/14/22 2130  Location: Sacrum  Location Orientation:   Staging: Stage 2 -  Partial thickness loss of dermis presenting as a shallow open injury with a red, pink wound bed without slough.  Wound Description (Comments):   Present on Admission: Yes  Dressing Type Foam - Lift dressing to assess site every shift 10/14/22 2325      Discussed with the daughter for return home with hospice today Daughter requesting flu shot prior to discharge  DVT prophylaxis: SCDs Code Status: DNR Family Communication: No family at bed side Disposition Plan:   Status is: Inpatient Remains inpatient appropriate because: Patient is being discharged to home hospice.   Consultants:  Cardiology PMT  Procedures: None Antimicrobials:   Subjective: Patient was seen and examined at bedside.  She is awake, alert and following commands. Patient is being discharged home hospice today  Objective: Vitals:   10/19/22 1621 10/19/22 2038 10/20/22 0556 10/20/22 0731  BP: (!) 155/71 (!) 157/144 (!) 169/79 (!) 175/72  Pulse: 86 85 68   Resp: '18 18 18 18  '$ Temp: 98.2 F (36.8 C) 98.3 F (36.8 C) (!) 97.4 F (36.3 C) 97.8 F (36.6 C)  TempSrc:  Oral Oral Oral  SpO2: 95% 95% 96% 100%  Weight:   61.3 kg     Intake/Output Summary (Last 24 hours) at 10/20/2022 1707 Last data filed at 10/20/2022 0800 Gross per 24 hour  Intake 630.11 ml  Output 900 ml  Net -269.89 ml   Filed Weights   10/16/22 0448 10/19/22 0421 10/20/22 0556  Weight: 43.1 kg 60.3 kg 61.3 kg    Examination:  General exam: Appears comfortable, very deconditioned.  Chronically ill looking Respiratory system: Decreased breath sounds, respiratory for normal, no accessory muscle  use. Cardiovascular system: S1 & S2 heard, regular rate and rhythm, no murmur. Gastrointestinal system: Abdomen is soft, non tender, non distended, BS+ Central nervous system: Alert and oriented x 1. No focal neurological deficits. Extremities: No edema, no cyanosis. No clubbing Skin: No rashes, lesions or ulcers Psychiatry: Not assessed.     Data Reviewed: I have personally reviewed following labs and imaging studies  CBC: Recent Labs  Lab 10/13/22 2145 10/14/22 0410 10/16/22 0222 10/17/22 1644 10/18/22 0520  WBC 8.0 7.7 6.9 8.3 7.7  NEUTROABS 5.2 5.0  --   --   --   HGB 14.6 13.9 15.1* 13.9 14.0  HCT 45.0 43.5 47.7* 44.3 44.3  MCV 98.9 100.0 99.0 101.1* 100.2*  PLT 237 233 PLATELET CLUMPS NOTED ON SMEAR, UNABLE TO ESTIMATE 212 440   Basic Metabolic Panel: Recent Labs  Lab 10/14/22 0410 10/15/22 0814 10/16/22 0222 10/17/22 0153 10/17/22 2043 10/18/22 0520  NA 144 142 146* 143 140 140  K 3.6 3.4* 4.2 5.5* 4.0 4.0  CL 110 113* 114* 112* 110 110  CO2 24 23 20* 19* 24 22  GLUCOSE 108* 122* 100* 97 138* 109*  BUN '16 12 15 '$ 24* 21 17  CREATININE 0.88 0.74 0.85 0.94 0.87 0.87  CALCIUM 9.4 9.2 9.9 9.5 9.1 9.3  MG 2.1  --   --   --   --   --   PHOS 2.2*  --   --   --   --   --  GFR: CrCl cannot be calculated (Unknown ideal weight.). Liver Function Tests: Recent Labs  Lab 10/13/22 2145 10/14/22 0410  AST 34 36  ALT 33 33  ALKPHOS 49 46  BILITOT 0.9 0.7  PROT 6.5 6.2*  ALBUMIN 2.8* 2.8*   No results for input(s): "LIPASE", "AMYLASE" in the last 168 hours. No results for input(s): "AMMONIA" in the last 168 hours. Coagulation Profile: Recent Labs  Lab 10/14/22 0650  INR 1.1   Cardiac Enzymes: Recent Labs  Lab 10/14/22 0650  CKTOTAL 59   BNP (last 3 results) No results for input(s): "PROBNP" in the last 8760 hours. HbA1C: No results for input(s): "HGBA1C" in the last 72 hours. CBG: No results for input(s): "GLUCAP" in the last 168 hours. Lipid  Profile: No results for input(s): "CHOL", "HDL", "LDLCALC", "TRIG", "CHOLHDL", "LDLDIRECT" in the last 72 hours. Thyroid Function Tests: No results for input(s): "TSH", "T4TOTAL", "FREET4", "T3FREE", "THYROIDAB" in the last 72 hours. Anemia Panel: No results for input(s): "VITAMINB12", "FOLATE", "FERRITIN", "TIBC", "IRON", "RETICCTPCT" in the last 72 hours. Sepsis Labs: Recent Labs  Lab 10/14/22 0410  PROCALCITON <0.10    Recent Results (from the past 240 hour(s))  Resp Panel by RT-PCR (Flu A&B, Covid) Anterior Nasal Swab     Status: None   Collection Time: 10/13/22 10:55 PM   Specimen: Anterior Nasal Swab  Result Value Ref Range Status   SARS Coronavirus 2 by RT PCR NEGATIVE NEGATIVE Final    Comment: (NOTE) SARS-CoV-2 target nucleic acids are NOT DETECTED.  The SARS-CoV-2 RNA is generally detectable in upper respiratory specimens during the acute phase of infection. The lowest concentration of SARS-CoV-2 viral copies this assay can detect is 138 copies/mL. A negative result does not preclude SARS-Cov-2 infection and should not be used as the sole basis for treatment or other patient management decisions. A negative result may occur with  improper specimen collection/handling, submission of specimen other than nasopharyngeal swab, presence of viral mutation(s) within the areas targeted by this assay, and inadequate number of viral copies(<138 copies/mL). A negative result must be combined with clinical observations, patient history, and epidemiological information. The expected result is Negative.  Fact Sheet for Patients:  EntrepreneurPulse.com.au  Fact Sheet for Healthcare Providers:  IncredibleEmployment.be  This test is no t yet approved or cleared by the [place] FDA and  has been authorized for detection and/or diagnosis of SARS-CoV-2 by FDA under an Emergency Use Authorization (EUA). This EUA will remain  in effect (meaning  this test can be used) for the duration of the COVID-19 declaration under Section 564(b)(1) of the Act, 21 U.S.C.section 360bbb-3(b)(1), unless the authorization is terminated  or revoked sooner.       Influenza A by PCR NEGATIVE NEGATIVE Final   Influenza B by PCR NEGATIVE NEGATIVE Final    Comment: (NOTE) The Xpert Xpress SARS-CoV-2/FLU/RSV plus assay is intended as an aid in the diagnosis of influenza from Nasopharyngeal swab specimens and should not be used as a sole basis for treatment. Nasal washings and aspirates are unacceptable for Xpert Xpress SARS-CoV-2/FLU/RSV testing.  Fact Sheet for Patients: EntrepreneurPulse.com.au  Fact Sheet for Healthcare Providers: IncredibleEmployment.be  This test is not yet approved or cleared by the [place] FDA and has been authorized for detection and/or diagnosis of SARS-CoV-2 by FDA under an Emergency Use Authorization (EUA). This EUA will remain in effect (meaning this test can be used) for the duration of the COVID-19 declaration under Section 564(b)(1) of the Act, 21 U.S.C. section 360bbb-3(b)(1),  unless the authorization is terminated or revoked.  Performed at [facility], [place] [address], [place], [place] [address]   Gram stain     Status: None   Collection Time: 10/14/22  1:21 PM   Specimen: Lung, Right; Pleural Fluid  Result Value Ref Range Status   Specimen Description LUNG  Final   Special Requests NONE  Final   Gram Stain   Final    WBC PRESENT,BOTH PMN AND MONONUCLEAR NO ORGANISMS SEEN CYTOSPIN SMEAR Performed at [facility], [place] [address], [place], [place] [address]    Report Status 10/14/2022 FINAL  Final  Culture, body fluid w Gram Stain-bottle     Status: None   Collection Time: 10/14/22  1:21 PM   Specimen: Pleura  Result Value Ref Range Status   Specimen Description PLEURAL  Final   Special Requests RIGHT  Final   Culture   Final    NO GROWTH 5  DAYS Performed at [place] [address] [place],  [address]    Report Status 10/19/2022 FINAL  Final    Radiology Studies: No results found.  Scheduled Meds:  amLODipine  5 mg Oral QPM   cilostazol  50 mg Oral BID WC   dorzolamide-timolol  1 drop Both Eyes BID   ezetimibe  10 mg Oral Daily   feeding supplement  237 mL Oral BID BM   hydrALAZINE  75 mg Oral BID   latanoprost  1 drop Both Eyes BID   metoprolol succinate  100 mg Oral Q supper   mirtazapine  15 mg Oral QHS   multivitamin with minerals  1 tablet Oral Daily   Continuous Infusions:   LOS: 6 days    Time spent: 35 mins    Kahealani Yankovich, MD [facility]   If 7PM-7AM, please contact night-coverage

## 2022-10-21 ENCOUNTER — Encounter: Payer: Self-pay | Admitting: Family Medicine

## 2022-10-21 ENCOUNTER — Telehealth: Payer: Self-pay

## 2022-10-21 NOTE — Telephone Encounter (Signed)
Pt. Is on my TOC list of people to call for a hospital f/u with you with in a week. Looks lie she is in hospice care now didn't know if you still wanted her to f/u her or not.

## 2022-10-21 NOTE — Telephone Encounter (Signed)
Called Hospice s/w [facility] & informed

## 2022-10-27 NOTE — Progress Notes (Signed)
Remote pacemaker transmission.   

## 2022-11-04 ENCOUNTER — Telehealth: Payer: Self-pay

## 2022-11-04 NOTE — Telephone Encounter (Signed)
Pt daughter states her mom was in the hospital and been at her house for 2 weeks. She is going to plug the monitor in tonight. Can someone check to make sure the transmission come through. If not please give her daughter a call back. I am off 11/05/2022.

## 2022-11-04 NOTE — Telephone Encounter (Signed)
No messages received for at least 21 days Last message received 21 days ago. The patient will be deactivated in 69 days.  Forwarding to CMA to investigate further and attempt reconnection with patient.

## 2022-11-05 NOTE — Telephone Encounter (Signed)
Monitor is now active.  No further action needed.

## 2022-11-11 ENCOUNTER — Other Ambulatory Visit: Payer: Self-pay | Admitting: Cardiovascular Disease

## 2022-12-21 DIAGNOSIS — H401133 Primary open-angle glaucoma, bilateral, severe stage: Secondary | ICD-10-CM | POA: Diagnosis not present

## 2022-12-28 ENCOUNTER — Ambulatory Visit: Payer: Medicare Other

## 2022-12-28 DIAGNOSIS — I442 Atrioventricular block, complete: Secondary | ICD-10-CM

## 2022-12-28 LAB — CUP PACEART REMOTE DEVICE CHECK
Date Time Interrogation Session: 20240213075125
Implantable Lead Connection Status: 753985
Implantable Lead Connection Status: 753985
Implantable Lead Implant Date: 20190225
Implantable Lead Implant Date: 20190225
Implantable Lead Location: 753859
Implantable Lead Location: 753860
Implantable Lead Model: 377
Implantable Lead Model: 377
Implantable Lead Serial Number: 80514208
Implantable Lead Serial Number: 80598782
Implantable Pulse Generator Implant Date: 20190225
Pulse Gen Model: 407145
Pulse Gen Serial Number: 69245949

## 2022-12-31 ENCOUNTER — Other Ambulatory Visit: Payer: Self-pay | Admitting: Cardiovascular Disease

## 2023-01-05 ENCOUNTER — Encounter: Payer: Self-pay | Admitting: Family Medicine

## 2023-01-06 MED ORDER — SULFAMETHOXAZOLE-TRIMETHOPRIM 800-160 MG PO TABS: 1.0000 | ORAL_TABLET | Freq: Two times a day (BID) | ORAL | 0 refills | Status: DC

## 2023-01-31 NOTE — Progress Notes (Signed)
Remote pacemaker transmission.   

## 2023-02-10 ENCOUNTER — Other Ambulatory Visit: Payer: Self-pay

## 2023-02-10 ENCOUNTER — Emergency Department (HOSPITAL_COMMUNITY)

## 2023-02-10 ENCOUNTER — Observation Stay (HOSPITAL_COMMUNITY)
Admission: EM | Admit: 2023-02-10 | Discharge: 2023-02-11 | Disposition: A | Discharge status: Home Health | Attending: Internal Medicine | Admitting: Internal Medicine

## 2023-02-10 ENCOUNTER — Inpatient Hospital Stay (HOSPITAL_COMMUNITY)

## 2023-02-10 ENCOUNTER — Encounter (HOSPITAL_COMMUNITY): Payer: Self-pay | Admitting: Internal Medicine

## 2023-02-10 DIAGNOSIS — M6281 Muscle weakness (generalized): Secondary | ICD-10-CM | POA: Insufficient documentation

## 2023-02-10 DIAGNOSIS — R5383 Other fatigue: Secondary | ICD-10-CM

## 2023-02-10 DIAGNOSIS — R531 Weakness: Secondary | ICD-10-CM

## 2023-02-10 DIAGNOSIS — G9341 Metabolic encephalopathy: Secondary | ICD-10-CM | POA: Insufficient documentation

## 2023-02-10 DIAGNOSIS — Z950 Presence of cardiac pacemaker: Secondary | ICD-10-CM | POA: Diagnosis not present

## 2023-02-10 DIAGNOSIS — R262 Difficulty in walking, not elsewhere classified: Secondary | ICD-10-CM | POA: Insufficient documentation

## 2023-02-10 DIAGNOSIS — Z955 Presence of coronary angioplasty implant and graft: Secondary | ICD-10-CM | POA: Diagnosis not present

## 2023-02-10 DIAGNOSIS — I6623 Occlusion and stenosis of bilateral posterior cerebral arteries: Secondary | ICD-10-CM | POA: Diagnosis not present

## 2023-02-10 DIAGNOSIS — J439 Emphysema, unspecified: Secondary | ICD-10-CM | POA: Diagnosis not present

## 2023-02-10 DIAGNOSIS — R6889 Other general symptoms and signs: Secondary | ICD-10-CM | POA: Diagnosis not present

## 2023-02-10 DIAGNOSIS — F0390 Unspecified dementia without behavioral disturbance: Secondary | ICD-10-CM | POA: Diagnosis not present

## 2023-02-10 DIAGNOSIS — N1831 Chronic kidney disease, stage 3a: Secondary | ICD-10-CM | POA: Diagnosis not present

## 2023-02-10 DIAGNOSIS — I129 Hypertensive chronic kidney disease with stage 1 through stage 4 chronic kidney disease, or unspecified chronic kidney disease: Secondary | ICD-10-CM | POA: Diagnosis not present

## 2023-02-10 DIAGNOSIS — Z79899 Other long term (current) drug therapy: Secondary | ICD-10-CM | POA: Diagnosis not present

## 2023-02-10 DIAGNOSIS — Z87891 Personal history of nicotine dependence: Secondary | ICD-10-CM | POA: Insufficient documentation

## 2023-02-10 DIAGNOSIS — G459 Transient cerebral ischemic attack, unspecified: Secondary | ICD-10-CM | POA: Diagnosis not present

## 2023-02-10 DIAGNOSIS — R410 Disorientation, unspecified: Secondary | ICD-10-CM

## 2023-02-10 DIAGNOSIS — Z743 Need for continuous supervision: Secondary | ICD-10-CM | POA: Diagnosis not present

## 2023-02-10 DIAGNOSIS — R29810 Facial weakness: Secondary | ICD-10-CM | POA: Diagnosis not present

## 2023-02-10 DIAGNOSIS — I639 Cerebral infarction, unspecified: Secondary | ICD-10-CM | POA: Diagnosis not present

## 2023-02-10 DIAGNOSIS — R509 Fever, unspecified: Secondary | ICD-10-CM | POA: Diagnosis not present

## 2023-02-10 LAB — I-STAT CHEM 8, ED
BUN: 12 mg/dL (ref 8–23)
Calcium, Ion: 0.85 mmol/L — CL (ref 1.15–1.40)
Chloride: 114 mmol/L — ABNORMAL HIGH (ref 98–111)
Creatinine, Ser: 0.8 mg/dL (ref 0.44–1.00)
Glucose, Bld: 95 mg/dL (ref 70–99)
HCT: 48 % — ABNORMAL HIGH (ref 36.0–46.0)
Hemoglobin: 16.3 g/dL — ABNORMAL HIGH (ref 12.0–15.0)
Potassium: 7.5 mmol/L (ref 3.5–5.1)
Sodium: 137 mmol/L (ref 135–145)
TCO2: 24 mmol/L (ref 22–32)

## 2023-02-10 LAB — CBC WITH DIFFERENTIAL/PLATELET
Abs Immature Granulocytes: 0.01 10*3/uL (ref 0.00–0.07)
Basophils Absolute: 0 10*3/uL (ref 0.0–0.1)
Basophils Relative: 1 %
Eosinophils Absolute: 0.1 10*3/uL (ref 0.0–0.5)
Eosinophils Relative: 2 %
HCT: 45.1 % (ref 36.0–46.0)
Hemoglobin: 14.3 g/dL (ref 12.0–15.0)
Immature Granulocytes: 0 %
Lymphocytes Relative: 28 %
Lymphs Abs: 1.7 10*3/uL (ref 0.7–4.0)
MCH: 31.6 pg (ref 26.0–34.0)
MCHC: 31.7 g/dL (ref 30.0–36.0)
MCV: 99.6 fL (ref 80.0–100.0)
Monocytes Absolute: 0.5 10*3/uL (ref 0.1–1.0)
Monocytes Relative: 8 %
Neutro Abs: 3.7 10*3/uL (ref 1.7–7.7)
Neutrophils Relative %: 61 %
Platelets: 185 10*3/uL (ref 150–400)
RBC: 4.53 MIL/uL (ref 3.87–5.11)
RDW: 13.4 % (ref 11.5–15.5)
WBC: 6 10*3/uL (ref 4.0–10.5)
nRBC: 0 % (ref 0.0–0.2)

## 2023-02-10 LAB — URINALYSIS, ROUTINE W REFLEX MICROSCOPIC
Bilirubin Urine: NEGATIVE
Glucose, UA: NEGATIVE mg/dL
Hgb urine dipstick: NEGATIVE
Ketones, ur: NEGATIVE mg/dL
Leukocytes,Ua: NEGATIVE
Nitrite: NEGATIVE
Protein, ur: NEGATIVE mg/dL
Specific Gravity, Urine: 1.01 (ref 1.005–1.030)
pH: 7 (ref 5.0–8.0)

## 2023-02-10 LAB — COMPREHENSIVE METABOLIC PANEL
ALT: 35 U/L (ref 0–44)
AST: 46 U/L — ABNORMAL HIGH (ref 15–41)
Albumin: 3.8 g/dL (ref 3.5–5.0)
Alkaline Phosphatase: 43 U/L (ref 38–126)
Anion gap: 12 (ref 5–15)
BUN: 9 mg/dL (ref 8–23)
CO2: 23 mmol/L (ref 22–32)
Calcium: 9.5 mg/dL (ref 8.9–10.3)
Chloride: 108 mmol/L (ref 98–111)
Creatinine, Ser: 0.9 mg/dL (ref 0.44–1.00)
GFR, Estimated: 59 mL/min — ABNORMAL LOW (ref >60)
Glucose, Bld: 95 mg/dL (ref 70–99)
Potassium: 4.9 mmol/L (ref 3.5–5.1)
Sodium: 143 mmol/L (ref 135–145)
Total Bilirubin: 0.8 mg/dL (ref 0.3–1.2)
Total Protein: 6.9 g/dL (ref 6.5–8.1)

## 2023-02-10 MED ORDER — ASPIRIN 325 MG PO TABS
325.0000 mg | ORAL_TABLET | Freq: Once | ORAL | Status: AC
  Administered 2023-02-11: 325 mg via ORAL
  Filled 2023-02-10: qty 1

## 2023-02-10 MED ORDER — SODIUM CHLORIDE 0.9 % IV SOLN: INTRAVENOUS | Status: AC

## 2023-02-10 MED ORDER — IOHEXOL 350 MG/ML SOLN
75.0000 mL | Freq: Once | INTRAVENOUS | Status: AC | PRN
  Administered 2023-02-10: 75 mL via INTRAVENOUS

## 2023-02-10 MED ORDER — ASPIRIN 81 MG PO TBEC
81.0000 mg | DELAYED_RELEASE_TABLET | Freq: Every day | ORAL | Status: DC
  Administered 2023-02-11: 81 mg via ORAL
  Filled 2023-02-10: qty 1

## 2023-02-10 MED ORDER — HEPARIN SODIUM (PORCINE) 5000 UNIT/ML IJ SOLN
5000.0000 [IU] | Freq: Three times a day (TID) | INTRAMUSCULAR | Status: DC
  Administered 2023-02-11 (×3): 5000 [IU] via SUBCUTANEOUS
  Filled 2023-02-10 (×3): qty 1

## 2023-02-10 MED ORDER — ACETAMINOPHEN 325 MG PO TABS: 650.0000 mg | ORAL_TABLET | ORAL | Status: DC | PRN

## 2023-02-10 MED ORDER — AMLODIPINE BESYLATE 5 MG PO TABS
5.0000 mg | ORAL_TABLET | Freq: Once | ORAL | Status: AC
  Administered 2023-02-10: 5 mg via ORAL
  Filled 2023-02-10: qty 1

## 2023-02-10 MED ORDER — STROKE: EARLY STAGES OF RECOVERY BOOK
Freq: Once | Status: AC
  Administered 2023-02-11: 1
  Filled 2023-02-10: qty 1

## 2023-02-10 MED ORDER — ACETAMINOPHEN 650 MG RE SUPP: 650.0000 mg | RECTAL | Status: DC | PRN

## 2023-02-10 MED ORDER — SENNOSIDES-DOCUSATE SODIUM 8.6-50 MG PO TABS: 1.0000 | ORAL_TABLET | Freq: Every evening | ORAL | Status: DC | PRN

## 2023-02-10 MED ORDER — ACETAMINOPHEN 160 MG/5ML PO SOLN: 650.0000 mg | ORAL | Status: DC | PRN

## 2023-02-10 NOTE — ED Notes (Signed)
Provider at bedside speaking with patient and family

## 2023-02-10 NOTE — ED Notes (Signed)
IV team inserted IV and 2nd blood culture sent off their blood draw

## 2023-02-10 NOTE — Progress Notes (Signed)
[place] ED28 [facility])       This patient is a current hospice patient with [facility], admitted with a terminal diagnosis of Other signs and symptoms of involving cognitive functions following cerebral vascular disease.      [facility] will continue to follow for any discharge planning needs and to coordinate continuation of hospice care.   Please don't hesitate to call with any Hospice related questions or concerns.    Thank you for the opportunity to participate in this patient's care. Jhonnie Garner, Therapist, sports, BSN, [facility] Liaison [phone]

## 2023-02-10 NOTE — ED Triage Notes (Signed)
Coming from home for increased lethargy and weakness was reported to ems. For EMS pt warm to touch no temp reported from family

## 2023-02-10 NOTE — ED Provider Notes (Signed)
[place] Provider Note   CSN: [id number] Arrival date & time: 02/10/23  1132     History  Chief Complaint  Patient presents with   Fatigue    Julie Bass is a 89 y.o. female.  Family reports patient has had mental status changes today.  They report patient has decreased ability to swallow, facial droop and staring to the right.  Family reports patient was last normal at 9 PM last night.  Patient is here with her daughter and son-in-law.  They care for patient.  Patient is able to ambulate with physical assistance until today.  Patient is a hospice patient.  The history is provided by a caregiver. No language interpreter was used.       Home Medications Prior to Admission medications   Medication Sig Start Date End Date Taking? Authorizing Provider  amLODipine (NORVASC) 5 MG tablet Take 1 tablet (5 mg total) by mouth every evening. Keep appointment for further refills. Patient taking differently: Take 5 mg by mouth every evening. 10/11/22   Troy Sine, MD  Biotin 10000 MCG TABS Take 10,000 mcg by mouth daily with breakfast.    [provider]  cilostazol (PLETAL) 50 MG tablet TAKE 1 TABLET BY MOUTH TWICE A DAY 01/03/23   Croitoru, Mihai, MD  citalopram (CELEXA) 20 MG tablet TAKE 1 TABLET BY MOUTH EVERY DAY Patient taking differently: Take 20 mg by mouth daily. 08/06/22   Denita Lung, MD  CRANBERRY PO Take 1 capsule by mouth 2 (two) times daily with a meal.    [provider]  dorzolamide-timolol (COSOPT) 2-0.5 % ophthalmic solution Place 1 drop into both eyes 2 (two) times daily. 10/05/22   [provider]  ezetimibe (ZETIA) 10 MG tablet TAKE 1 TABLET BY MOUTH EVERY DAY Patient taking differently: Take 10 mg by mouth daily. 09/20/22   Troy Sine, MD  feeding supplement, ENSURE COMPLETE, (ENSURE COMPLETE) LIQD Take 237 mLs by mouth 3 (three) times daily between meals. 08/27/14   Barrett,  Evelene Croon, PA-C  hydrALAZINE (APRESOLINE) 25 MG tablet Take 3 tablets (75 mg total) by mouth 2 (two) times daily. 10/19/22 11/18/22  Antonieta Pert, MD  latanoprost (XALATAN) 0.005 % ophthalmic solution Place 1 drop into both eyes daily. 04/28/22   [provider]  LIVALO 2 MG TABS Take 1 tablet by mouth daily. 08/05/22   [provider]  metoprolol succinate (TOPROL-XL) 100 MG 24 hr tablet TAKE 1 TABLET BY MOUTH EVERY EVENING. Patient taking differently: Take 100 mg by mouth daily with supper. 05/12/22   Troy Sine, MD  mirtazapine (REMERON SOL-TAB) 15 MG disintegrating tablet TAKE 1 TABLET BY MOUTH EVERYDAY AT BEDTIME Patient not taking: Reported on 05/25/2022 01/28/22   Denita Lung, MD  omega-3 acid ethyl esters (LOVAZA) 1 g capsule TAKE 1 CAPSULE (1 G TOTAL) BY MOUTH EVERY EVENING. 11/12/22   Troy Sine, MD  pantoprazole (PROTONIX) 40 MG tablet TAKE 1 TABLET (40 MG TOTAL) BY MOUTH DAILY AS NEEDED (FOR ACID REFLUX). Patient taking differently: Take 40 mg by mouth daily as needed (For acid reflux). 05/12/22   Troy Sine, MD  sulfamethoxazole-trimethoprim (BACTRIM DS) 800-160 MG tablet Take 1 tablet by mouth 2 (two) times daily. 01/06/23   Denita Lung, MD      Allergies    Crestor [rosuvastatin calcium] and Lipitor [atorvastatin calcium]    Review of Systems   Review of Systems  Constitutional:  Positive for fatigue.  Neurological:  Positive for facial asymmetry and speech difficulty.  All other systems reviewed and are negative.   Physical Exam Updated Vital Signs BP (!) 195/133 (BP Location: Right Arm)   Pulse (!) 120   Temp 97.8 F (36.6 C) (Axillary)   Resp 18   Ht 5\' 4"  (1.626 m)   Wt 61.3 kg   SpO2 100%   BMI 23.20 kg/m  Physical Exam Vitals and nursing note reviewed.  Constitutional:      Appearance: She is well-developed.  HENT:     Head: Normocephalic.     Comments: Facial droop right side    Mouth/Throat:     Mouth: Mucous membranes are  moist.  Eyes:     Comments: Rightward gaze  Cardiovascular:     Rate and Rhythm: Normal rate.  Pulmonary:     Effort: Pulmonary effort is normal.  Abdominal:     General: Abdomen is flat. There is no distension.  Musculoskeletal:        General: Normal range of motion.     Cervical back: Normal range of motion.  Skin:    General: Skin is warm.  Neurological:     Mental Status: She is alert and oriented to person, place, and time.     ED Results / Procedures / Treatments   Labs (all labs ordered are listed, but only abnormal results are displayed) Labs Reviewed  URINALYSIS, ROUTINE W REFLEX MICROSCOPIC - Abnormal; Notable for the following components:      Result Value   APPearance CLOUDY (*)    All other components within normal limits  CBC WITH DIFFERENTIAL/PLATELET  COMPREHENSIVE METABOLIC PANEL  CBC WITH DIFFERENTIAL/PLATELET  I-STAT CHEM 8, ED    EKG EKG Interpretation  Date/Time:  Thursday February 10 2023 11:53:17 EDT Ventricular Rate:  60 PR Interval:    QRS Duration: 95 QT Interval:  466 QTC Calculation: 466 R Axis:   -8 Text Interpretation: Normal sinus rhythm Probable LVH with secondary repol abnrm Confirmed by Garnette Gunner [id number]) on 02/10/2023 12:20:39 PM  Radiology CT Head Wo Contrast  Result Date: 02/10/2023 CLINICAL DATA:  Facial paralysis/weakness EXAM: CT HEAD WITHOUT CONTRAST TECHNIQUE: Contiguous axial images were obtained from the base of the skull through the vertex without intravenous contrast. RADIATION DOSE REDUCTION: This exam was performed according to the departmental dose-optimization program which includes automated exposure control, adjustment of the mA and/or kV according to patient size and/or use of iterative reconstruction technique. COMPARISON:  10/13/2022 FINDINGS: Brain: No evidence of acute infarction, hemorrhage, mass, mass effect, or midline shift. No hydrocephalus or extra-axial fluid collection. Redemonstrated diffuse cerebral  atrophy with prominence of the CSF spaces. Periventricular white matter changes, likely the sequela of chronic small vessel ischemic disease. Basal ganglia calcifications. Vascular: No hyperdense vessel. Atherosclerotic calcifications in the intracranial carotid and vertebral arteries. Skull: Negative for fracture or focal lesion. Sinuses/Orbits: Mucous retention cyst in the right maxillary sinus. Mild mucosal thickening in the ethmoid air cells. Status post bilateral lens replacements. Other: The mastoid air cells are well aerated. IMPRESSION: No acute intracranial process. Electronically Signed   By: Merilyn Baba M.D.   On: 02/10/2023 14:39   DG Chest Port 1 View  Result Date: 02/10/2023 CLINICAL DATA:  Altered mental status, increased lethargy and weakness EXAM: PORTABLE CHEST 1 VIEW COMPARISON:  10/14/2022 FINDINGS: Transverse diameter of heart is increased. Thoracic aorta is tortuous and ectatic. Lung fields are clear of any infiltrate or pulmonary edema.  There is interval improvement in the aeration in the lower lung fields. There is no pleural effusion or pneumothorax. Pacemaker battery is seen in left infraclavicular region with tips of leads in right atrium and right ventricle. There is previous reverse arthroplasty right shoulder. Surgical clips are seen in right upper quadrant of abdomen. IMPRESSION: There are no signs of pulmonary edema or focal pulmonary consolidation. Electronically Signed   By: Elmer Picker M.D.   On: 02/10/2023 12:41    Procedures Procedures    Medications Ordered in ED Medications - No data to display  ED Course/ Medical Decision Making/ A&P                             Medical Decision Making Patient was last normal last p.m. patient has some facial droop today, difficulty swallowing and rightward gaze  Amount and/or Complexity of Data Reviewed Independent Historian: caregiver    Details: Patient is here with her daughter and son-in-law who are  supportive External Data Reviewed: notes.    Details: Patient is on hospice care Labs: ordered. Decision-making details documented in ED Course.    Details: Labs ordered reviewed and interpreted UA is negative Radiology: ordered and independent interpretation performed. Decision-making details documented in ED Course.    Details: CT head shows no acute abnormality Discussion of management or test interpretation with external provider(s): I discussed with [facility].  He advised CtA head and neck.  He advised let him know results.  He does not think pt will need invasive treatment  Pt's care turned over to Alecia Lemming [facility] at 3:30pm            Final Clinical Impression(s) / ED Diagnoses Final diagnoses:  Other fatigue  Weakness    Rx / DC Orders ED Discharge Orders     None         Sidney Ace 02/10/23 1525    Cristie Hem, MD 02/11/23 1455

## 2023-02-10 NOTE — ED Triage Notes (Signed)
Family last noticed the pt was her normal self was 9pm last night

## 2023-02-10 NOTE — ED Notes (Signed)
Patient transported to CT 

## 2023-02-10 NOTE — ED Notes (Signed)
Transport tele monitor applied to patient, patient transported to the floor by Gregary Signs, Therapist, sports

## 2023-02-10 NOTE — H&P (Addendum)
History and Physical    Julie Bass [id number] DOB: 1926/06/03 DOA: 02/10/2023  PCP: Denita Lung, MD  Patient coming from: home  I have personally briefly reviewed patient's old medical records in [place]  Chief Complaint: change in ms , right sided facial droop  HPI: Julie Bass is a 89 y.o. female with medical history significant of  essential hypertension, hyperlipidemia, PAD s/p stenting, osteoporosis, glaucoma, depression, dementia, CKD stage IIIa, NSVT, Bradycardia s/p pacemaker,on home hospice who presents to ED  brought in by family after family noted this am on walking patient was lethargic and weak  with garbled speech, and right sided facial droop which was not her baseline. Family also noted that patient seemed to have difficulty swallowing and would not eat. Family also noted patient leaning toward the right with attempts to assisted ambulation. Due to these symptoms family  called EMS.   ED Course:   Vitals:  Afeb, 174/90-199/85 , Hr 60 , rr , sat 100% on a  EKG: NSR LVH [id number] are no signs of pulmonary edema or focal pulmonary consolidation. UA neg Na 143, K 4.9, cl 108 , cr 0.90 Wbc: 6, hg 14.3, plt 185  CTA 1. 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 Of note, there are near fetal origins of the bilateral PCAs, so that the majority of the PCA blood supply comes from the anterior circulation. 2. Multifocal moderate stenosis in the bilateral V4 segments. Multifocal irregularity and mild-to-moderate stenosis in the bilateral intracranial ICA and bilateral P2 segments. 3. Moderate stenosis at the origin of the right vertebral artery and mild stenosis at the origin of the left vertebral artery. No other hemodynamically significant stenosis in the neck. 4. 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 Smaller hyperenhancing mass in the right parotid gland measures up to 1.3 cm, which is also unchanged. 5. Aortic atherosclerosis.    OF note shortly after arrival to ED family noted patient was back to baseline CTH - noted to be negative  CTA with multifocal severe stenosis  however noting  majority of pca blood supply comes from anterior circulation Neurology has seen patient and will leave final rec , patient not able to get MRI currently has patient has pace maker unclear if MRI ready  Patient admitted for further CVA evaluation Hospice consult also placed to help family with goal of care   Tx amlodipine 5 mg   Review of Systems: As per HPI otherwise 10 point review of systems negative.    Past Medical History:  Diagnosis Date   Arthritis    Chronic kidney disease    KIDNEY STONES   Dementia ([place])    BEGINNING STAGES    Depression    GERD (gastroesophageal reflux disease)    Glaucoma    Hiatal hernia    HTN (hypertension) 10/18/2011   Hyperlipemia 10/18/2011   NSVT (nonsustained ventricular tachycardia) ([facility]) 10/18/2011   Osteoporosis    PAD (peripheral artery disease) ([place]) 10/18/2011   h/o left SFA stent    Past Surgical History:  Procedure Laterality Date   ABDOMINAL HYSTERECTOMY  1973   CARDIAC CATHETERIZATION     2012   CHOLECYSTECTOMY     IR THORACENTESIS ASP PLEURAL SPACE W/IMG GUIDE  10/14/2022   LOOP RECORDER IMPLANT N/A 08/27/2014  Procedure: LOOP RECORDER IMPLANT;  Surgeon: Coralyn Mark, MD;  Location: [facility];  Service: Cardiovascular;  Laterality: N/A;   LOOP RECORDER REMOVAL N/A 01/09/2018   Procedure: LOOP RECORDER REMOVAL;  Surgeon: Evans Lance, MD;  Location: [facility];  Service: Cardiovascular;  Laterality: N/A;   LOWER EXTREMITY ANGIOGRAM  11/02/2007   stent of mid left SFA with 6x175mm EV3 self-expanding stent and 6x3 in prox region (Dr. Adora Fridge)   [place] ARTERIAL DOPPLER  2013   right SFA with 50-69% diameter reduction, right PTA/peroneal occluded, L SFA prox to stent has narrowing with increased velocities >60% diameter reduction, L SFA stent patent, L ATA w/occlusive disease, bilat ABIs show mild arterial insuffiency at rest   NM MYOCAR PERF WALL MOTION  10/2011   non-gated - normal stuy, EF 82%, normal LV wall motion   PACEMAKER IMPLANT N/A 01/09/2018   Procedure: PACEMAKER IMPLANT;  Surgeon: Evans Lance, MD;  Location: [facility];  Service: Cardiovascular;  Laterality: N/A;   REVERSE SHOULDER ARTHROPLASTY Right 01/26/2013   Procedure: RIGHT REVERSE TOTAL SHOULDER ARTHROPLASTY;  Surgeon: Augustin Schooling, MD;  Location: [facility];  Service: Orthopedics;  Laterality: Right;   TRANSTHORACIC ECHOCARDIOGRAM  10/2012   EF 75-70%, mod conc hypertrophy, severely calcified MV annulus, LA mildly dailted, PA peak pressure 65mmHg     reports that she quit smoking about 38 years ago. Her smoking use included cigarettes. She has never used smokeless tobacco. She reports that she does not drink alcohol and does not use drugs.  Allergies  Allergen Reactions   Crestor [Rosuvastatin Calcium] Other (See Comments)    Muscle pain, myalgia   Lipitor [Atorvastatin Calcium] Other (See Comments)    Muscle pain, myalgia    Family History  Problem Relation Age of Onset   Throat cancer Father    Cancer Mother    CAD Son    Breast cancer Sister        cervical cancer   Colon cancer Brother        throat cancer   Pulmonary embolism Daughter    Hypertension Daughter     Prior to Admission medications   Medication Sig Start Date End Date Taking? Authorizing Provider  amLODipine (NORVASC) 5 MG tablet Take 1 tablet (5 mg total) by mouth every evening. Keep appointment for further refills. Patient taking differently: Take 5 mg by mouth every evening. 10/11/22   Troy Sine, MD  Biotin 10000 MCG TABS Take  10,000 mcg by mouth daily with breakfast.    [provider]  cilostazol (PLETAL) 50 MG tablet TAKE 1 TABLET BY MOUTH TWICE A DAY 01/03/23   Croitoru, Mihai, MD  citalopram (CELEXA) 20 MG tablet TAKE 1 TABLET BY MOUTH EVERY DAY Patient taking differently: Take 20 mg by mouth daily. 08/06/22   Denita Lung, MD  CRANBERRY PO Take 1 capsule by mouth 2 (two) times daily with a meal.    [provider]  dorzolamide-timolol (COSOPT) 2-0.5 % ophthalmic solution Place 1 drop into both eyes 2 (two) times daily. 10/05/22   [provider]  ezetimibe (ZETIA) 10 MG tablet TAKE 1 TABLET BY MOUTH EVERY DAY Patient taking differently: Take 10 mg by mouth daily. 09/20/22   Troy Sine, MD  feeding supplement, ENSURE COMPLETE, (ENSURE COMPLETE) LIQD Take 237 mLs by mouth 3 (three) times daily between meals. 08/27/14   Barrett, Evelene Croon, PA-C  hydrALAZINE (APRESOLINE) 25 MG tablet Take 3 tablets (75 mg total) by mouth 2 (two) times daily. 10/19/22 11/18/22  Antonieta Pert, MD  latanoprost (XALATAN) 0.005 % ophthalmic solution Place 1 drop into both eyes daily. 04/28/22   [provider]  LIVALO 2 MG TABS Take 1 tablet by mouth daily. 08/05/22   [provider]  metoprolol succinate (TOPROL-XL) 100 MG 24 hr tablet TAKE 1 TABLET BY MOUTH EVERY EVENING. Patient taking differently: Take 100 mg by mouth daily with supper. 05/12/22   Troy Sine, MD  mirtazapine (REMERON SOL-TAB) 15 MG disintegrating tablet TAKE 1 TABLET BY MOUTH EVERYDAY AT BEDTIME Patient not taking: Reported on 05/25/2022 01/28/22   Denita Lung, MD  omega-3 acid ethyl esters (LOVAZA) 1 g capsule TAKE 1 CAPSULE (1 G TOTAL) BY MOUTH EVERY EVENING. 11/12/22   Troy Sine, MD  pantoprazole (PROTONIX) 40 MG tablet TAKE 1 TABLET (40 MG TOTAL) BY MOUTH DAILY AS NEEDED (FOR ACID REFLUX). Patient taking differently: Take 40 mg by mouth daily as needed (For acid reflux). 05/12/22   Troy Sine, MD   sulfamethoxazole-trimethoprim (BACTRIM DS) 800-160 MG tablet Take 1 tablet by mouth 2 (two) times daily. 01/06/23   Denita Lung, MD    Physical Exam: Vitals:   02/10/23 1515 02/10/23 1846 02/10/23 2033 02/10/23 2140  BP: (!) 197/91 (!) 208/82 (!) 187/83 (!) 200/75  Pulse: 60 60 (!) 59 (!) 59  Resp: 12 18 14 14   Temp:  97.6 F (36.4 C)  98.1 F (36.7 C)  TempSrc:  Oral  Axillary  SpO2: 99% 100% 99% 99%  Weight:      Height:        Constitutional: NAD, calm, comfortable Vitals:   02/10/23 1515 02/10/23 1846 02/10/23 2033 02/10/23 2140  BP: (!) 197/91 (!) 208/82 (!) 187/83 (!) 200/75  Pulse: 60 60 (!) 59 (!) 59  Resp: 12 18 14 14   Temp:  97.6 F (36.4 C)  98.1 F (36.7 C)  TempSrc:  Oral  Axillary  SpO2: 99% 100% 99% 99%  Weight:      Height:       Eyes: left pupil reactive right pupil opacified, lids and conjunctivae normal ENMT: Mucous membranes are moist. Posterior pharynx clear of any exudate or lesions. Left parotid mass, mobile non-tender Neck: normal, supple, no masses, no thyromegaly Respiratory: clear to auscultation bilaterally, no wheezing, no crackles. Normal respiratory effort. No accessory muscle use.  Cardiovascular: Regular rate and rhythm, no murmurs / rubs / gallops. No extremity edema. 2+ pedal pulses.  Abdomen: no tenderness, no masses palpated. No hepatosplenomegaly. Bowel sounds positive.  Musculoskeletal: no clubbing / cyanosis. No joint deformity upper and lower extremities. Good ROM, no contractures. Normal muscle tone.  Skin: no rashes, lesions, ulcers. No induration Neurologic: CN 2-12 grossly intact. Sensation intact, . Strength 4/5 in all 4.  Psychiatric: Normal judgment and insight. Alert and oriented x 3. Normal mood.    Labs on Admission: I have personally reviewed following labs and imaging studies  CBC: Recent Labs  Lab 02/10/23 1542 02/10/23 1900  WBC  --  6.0  NEUTROABS  --  3.7  HGB 16.3* 14.3  HCT 48.0* 45.1  MCV  --  99.6   PLT  --  [id number]   Basic Metabolic Panel: Recent Labs  Lab 02/10/23 1542 02/10/23 1558  NA 137 143  K 7.5* 4.9  CL 114* 108  CO2  --  23  GLUCOSE 95 95  BUN 12 9  CREATININE 0.80 0.90  CALCIUM  --  9.5   GFR: Estimated Creatinine Clearance: 32.3 mL/min (by C-G formula based on SCr of 0.9 mg/dL). Liver Function Tests: Recent Labs  Lab 02/10/23 1558  AST 46*  ALT 35  ALKPHOS 43  BILITOT 0.8  PROT 6.9  ALBUMIN 3.8   No results for input(s): "LIPASE", "AMYLASE" in the last 168 hours. No results for input(s): "AMMONIA" in the last 168 hours. Coagulation Profile: No results for input(s): "INR", "PROTIME" in the last 168 hours. Cardiac Enzymes: No results for input(s): "CKTOTAL", "CKMB", "CKMBINDEX", "TROPONINI" in the last 168 hours. BNP (last 3 results) No results for input(s): "PROBNP" in the last 8760 hours. HbA1C: No results for input(s): "HGBA1C" in the last 72 hours. CBG: No results for input(s): "GLUCAP" in the last 168 hours. Lipid Profile: No results for input(s): "CHOL", "HDL", "LDLCALC", "TRIG", "CHOLHDL", "LDLDIRECT" in the last 72 hours. Thyroid Function Tests: No results for input(s): "TSH", "T4TOTAL", "FREET4", "T3FREE", "THYROIDAB" in the last 72 hours. Anemia Panel: No results for input(s): "VITAMINB12", "FOLATE", "FERRITIN", "TIBC", "IRON", "RETICCTPCT" in the last 72 hours. Urine analysis:    Component Value Date/Time   COLORURINE YELLOW 02/10/2023 1432   APPEARANCEUR CLOUDY (A) 02/10/2023 1432   LABSPEC 1.010 02/10/2023 1432   LABSPEC 1.015 06/12/2020 1456   PHURINE 7.0 02/10/2023 1432   GLUCOSEU NEGATIVE 02/10/2023 1432   HGBUR NEGATIVE 02/10/2023 1432   BILIRUBINUR NEGATIVE 02/10/2023 1432   BILIRUBINUR negative 06/12/2020 1456   BILIRUBINUR n 11/25/2016 1409   KETONESUR NEGATIVE 02/10/2023 1432   PROTEINUR NEGATIVE 02/10/2023 1432   UROBILINOGEN negative 11/25/2016 1409   UROBILINOGEN 1 10/08/2014 1529   NITRITE NEGATIVE 02/10/2023 1432    LEUKOCYTESUR NEGATIVE 02/10/2023 1432    Radiological Exams on Admission: CT ANGIO HEAD NECK W WO CM  Result Date: 02/10/2023 CLINICAL DATA:  Increased lethargy and weakness EXAM: CT ANGIOGRAPHY HEAD AND NECK TECHNIQUE: Multidetector CT imaging of the head and neck was performed using the standard protocol during bolus administration of intravenous contrast. Multiplanar CT image reconstructions and MIPs were obtained to evaluate the vascular anatomy. Carotid stenosis measurements (when applicable) are obtained utilizing NASCET criteria, using the distal internal carotid diameter as the denominator. RADIATION DOSE REDUCTION: This exam was performed according to the departmental dose-optimization program which includes automated exposure control, adjustment of the mA and/or kV according to patient size and/or use of iterative reconstruction technique. CONTRAST:  4mL OMNIPAQUE IOHEXOL 350 MG/ML SOLN COMPARISON:  No prior CTA available, correlation is made with CT head 02/10/2023 FINDINGS: CT HEAD FINDINGS For noncontrast findings, please see same day CT head. CTA NECK FINDINGS Aortic arch: Standard branching. Imaged portion shows no evidence of aneurysm or dissection. No significant stenosis of the major arch vessel origins. Aortic atherosclerosis. Right carotid system: No evidence of dissection, occlusion, or hemodynamically significant stenosis (greater than 50%). Left carotid system: No evidence of dissection, occlusion, or hemodynamically significant stenosis (greater than 50%). Vertebral arteries: Moderate stenosis at the origin of the right vertebral artery. Mild stenosis at the origin of the left vertebral artery. The vertebral arteries are otherwise patent to the skull base without significant stenosis. No evidence of dissection. Skeleton: No acute osseous abnormality. Degenerative changes in the cervical spine. 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 Smaller hyperenhancing mass right parotid gland measures 1.2 x 1.3 x 1.1 cm (series 8, image 77), previously 1.1 x 1.1 x 1.2 cm on the prior chest CT. Upper chest: No focal pulmonary opacity or pleural effusion. Review of the MIP images confirms the above findings CTA HEAD FINDINGS Anterior circulation: Both internal carotid arteries are patent to the termini, with mild-to-moderate stenosis in the bilateral cavernous and supraclinoid segments. A1 segments patent. Normal anterior communicating artery. Anterior cerebral arteries are patent to their distal aspects without significant stenosis. No M1 stenosis or occlusion. MCA branches perfused to their distal aspects without significant stenosis. Posterior circulation: Vertebral arteries patent to the vertebrobasilar junction with multifocal moderate stenosis in the left V4 and moderate stenosis in the distal right V4. Posterior inferior cerebellar arteries patent proximally. 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 Superior cerebellar arteries patent proximally. Hypoplastic, patent P1 segments. Near fetal origin of the bilateral PCAs from the posterior communicating arteries. Multifocal irregularity and mild-to-moderate stenosis in the bilateral P2 segments (series 8, images 120 1-123, for example). Venous sinuses: As permitted by contrast timing, patent. Anatomic variants: Near fetal origin of the bilateral PCAs. Review of the MIP images confirms the above findings IMPRESSION: 1. 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 Of note, there are  near fetal origins of the bilateral PCAs, so that the majority of the PCA blood supply comes from the anterior circulation. 2. Multifocal moderate stenosis in the bilateral V4 segments. Multifocal irregularity and mild-to-moderate stenosis in the bilateral intracranial ICA and bilateral P2 segments. 3. Moderate stenosis at the origin of the right vertebral artery and mild stenosis at the origin of the left vertebral artery. No other hemodynamically significant stenosis in the neck. 4. 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 Smaller hyperenhancing mass in the right parotid gland measures up to 1.3 cm, which is also unchanged. 5. Aortic atherosclerosis. Aortic Atherosclerosis (ICD10-I70.0). Electronically Signed   By: Merilyn Baba M.D.   On: 02/10/2023 17:51   CT Head Wo Contrast  Result Date: 02/10/2023 CLINICAL DATA:  Facial paralysis/weakness EXAM: CT HEAD WITHOUT CONTRAST TECHNIQUE: Contiguous axial images were obtained from the base of the skull through the vertex without intravenous contrast. RADIATION DOSE REDUCTION: This exam was performed according to the departmental dose-optimization program which includes automated exposure control, adjustment of the mA and/or kV according to patient size and/or use of iterative reconstruction technique. COMPARISON:  10/13/2022 FINDINGS: Brain: No evidence of acute infarction, hemorrhage, mass, mass effect, or midline shift. No hydrocephalus or extra-axial fluid collection. Redemonstrated diffuse cerebral atrophy with prominence of the CSF spaces. Periventricular white matter changes, likely the sequela of chronic small vessel ischemic disease. Basal ganglia calcifications. Vascular: No hyperdense vessel. Atherosclerotic calcifications in the intracranial carotid and vertebral arteries. Skull: Negative for fracture or focal lesion.  Sinuses/Orbits: Mucous retention cyst in the right maxillary sinus. Mild mucosal thickening in the ethmoid air cells. Status post bilateral lens replacements. Other: The mastoid air cells are well aerated. IMPRESSION: No acute intracranial process. Electronically Signed   By: Merilyn Baba M.D.   On: 02/10/2023 14:39   DG  Chest Port 1 View  Result Date: 02/10/2023 CLINICAL DATA:  Altered mental status, increased lethargy and weakness EXAM: PORTABLE CHEST 1 VIEW COMPARISON:  10/14/2022 FINDINGS: Transverse diameter of heart is increased. Thoracic aorta is tortuous and ectatic. Lung fields are clear of any infiltrate or pulmonary edema. There is interval improvement in the aeration in the lower lung fields. There is no pleural effusion or pneumothorax. Pacemaker battery is seen in left infraclavicular region with tips of leads in right atrium and right ventricle. There is previous reverse arthroplasty right shoulder. Surgical clips are seen in right upper quadrant of abdomen. IMPRESSION: There are no signs of pulmonary edema or focal pulmonary consolidation. Electronically Signed   By: Ernie Avena M.D.   On: 02/10/2023 12:41    EKG: Independently reviewed. See above  Assessment/Plan  TIA r/o CVA Acute Metabolic encephalopathy -change in ms weakness right sided facial droop garbled speech now resolved   - CT head negative for acute finding , neuro exam  no acute new findings  -admit TIA r/o CVA  -Unable to get MRI at this time as patient has pace-maker -asa, plavix  per stroke protocol  -neuro checks , SLP, PT/OT  -echo - patient seen by neurology await further recs -possible apathetic viral infection to be complete  respiratory panel ordered  Essential hypertension, Uncontrolled  - per family usually well controlled  -resume home regimen ( hydralazine , amlodipine,)   Hyperlipidemia -patient not on statin and does not follow a specific diet    PAD s/p stenting - of Cilostazol   -as while on hospice meds were adjusted with goal  to minimize medications taken for patient comfort   Depression  Dementia  - on low dose xanax 0.25 mg prn qhs per family  -celexa   CKDIIIa -at baseline   Bradycardia  -s/p pacemaker   Glaucoma  -resume drops once med rec completed   Hx of NSVT - no active issue currently   GERD -ppi    DVT prophylaxis: .heparin Code Status:DNR/ as discussed per patient wishes in event of cardiac arrest  Family Communication:   Gaynelle Cage (Daughter) [phone] ([place])  Daughter  Disposition Plan: patient  expected to be admitted less than 2 midnights  Consults called: neurology  Admission status: progressive care    Lurline Del MD [facility]   If 7PM-7AM, please contact night-coverage [email or web address] Password [facility]  02/10/2023, 8:06 PM

## 2023-02-10 NOTE — Consult Note (Signed)
Neurology Consultation  Reason for Consult: Neurologic deficits with right mouth droop, trouble swallowing, garbled speech, concern for possible stroke Referring Physician: Dr. Truett Mainland  CC: "I am cold on my right side"  History is obtained from: Patient's son and daughter at bedside  HPI: Julie Bass is a 89 y.o. female with medical history significant for essential hypertension, hyperlipidemia, PAD s/p stenting, osteoporosis, glaucoma, depression, dementia, CKD stage IIIa, NSVT, on home hospice who presented to the ED on 02/10/2023 after family noticed that patient had right-sided facial droop, was unable to eat and drink normally, and had garbled/slurred speech.  Per patient's family, patient went to bed in her usual state of health last night at 21:00.  This morning, when family was helping her to walk to the restroom, family noted that she was leaning side-to-side with her walking with an unstable gait which is abnormal for her.  While feeding her breakfast, patient was leaning towards the right and food and drink was falling out of the right side of her mouth.  She was also not as interactive with family as she typically is and patient was subsequently brought to the hospital for further evaluation.  Due to concern for possible stroke, neurology was consulted for further evaluation.  At the time of neurology evaluation, family states that patient is back to her baseline neurologic status.  LKW: 02/09/23 at 21:00 prior to sleep TNK given?: no, patient presents outside of the thrombolytic therapy time window.  MRS of 4.  Patient currently on hospice. IR Thrombectomy? No, presentation is not consistent with an LVO. Modified Rankin Scale: 4-Needs assistance to walk and tend to bodily needs  ROS: A complete ROS was performed and is negative except as noted in the HPI.   Past Medical History:  Diagnosis Date   Arthritis    Chronic kidney disease    KIDNEY STONES   Dementia ([place])     BEGINNING STAGES    Depression    GERD (gastroesophageal reflux disease)    Glaucoma    Hiatal hernia    HTN (hypertension) 10/18/2011   Hyperlipemia 10/18/2011   NSVT (nonsustained ventricular tachycardia) ([facility]) 10/18/2011   Osteoporosis    PAD (peripheral artery disease) ([place]) 10/18/2011   h/o left SFA stent   Past Surgical History:  Procedure Laterality Date   ABDOMINAL HYSTERECTOMY  1973   CARDIAC CATHETERIZATION     2012   CHOLECYSTECTOMY     IR THORACENTESIS ASP PLEURAL SPACE W/IMG GUIDE  10/14/2022   LOOP RECORDER IMPLANT N/A 08/27/2014   Procedure: LOOP RECORDER IMPLANT;  Surgeon: Coralyn Mark, MD;  Location: [facility];  Service: Cardiovascular;  Laterality: N/A;   LOOP RECORDER REMOVAL N/A 01/09/2018   Procedure: LOOP RECORDER REMOVAL;  Surgeon: Evans Lance, MD;  Location: [facility];  Service: Cardiovascular;  Laterality: N/A;   LOWER EXTREMITY ANGIOGRAM  11/02/2007   stent of mid left SFA with 6x173mm EV3 self-expanding stent and 6x3 in prox region (Dr. Adora Fridge)   [place] ARTERIAL DOPPLER  2013   right SFA with 50-69% diameter reduction, right PTA/peroneal occluded, L SFA prox to stent has narrowing with increased velocities >60% diameter reduction, L SFA stent patent, L ATA w/occlusive disease, bilat ABIs show mild arterial insuffiency at rest   NM MYOCAR PERF WALL MOTION  10/2011   non-gated - normal stuy, EF 82%, normal LV wall motion   PACEMAKER IMPLANT N/A 01/09/2018  Procedure: PACEMAKER IMPLANT;  Surgeon: Evans Lance, MD;  Location: [facility];  Service: Cardiovascular;  Laterality: N/A;   REVERSE SHOULDER ARTHROPLASTY Right 01/26/2013   Procedure: RIGHT REVERSE TOTAL SHOULDER ARTHROPLASTY;  Surgeon: Augustin Schooling, MD;  Location: [facility];  Service: Orthopedics;  Laterality: Right;   TRANSTHORACIC ECHOCARDIOGRAM  10/2012   EF 75-70%, mod conc hypertrophy, severely calcified MV annulus, LA mildly dailted,  PA peak pressure 65mmHg   Family History  Problem Relation Age of Onset   Throat cancer Father    Cancer Mother    CAD Son    Breast cancer Sister        cervical cancer   Colon cancer Brother        throat cancer   Pulmonary embolism Daughter    Hypertension Daughter    Social History:   reports that she quit smoking about 38 years ago. Her smoking use included cigarettes. She has never used smokeless tobacco. She reports that she does not drink alcohol and does not use drugs.  Medications No current facility-administered medications for this encounter.  Current Outpatient Medications:    amLODipine (NORVASC) 5 MG tablet, Take 1 tablet (5 mg total) by mouth every evening. Keep appointment for further refills. (Patient taking differently: Take 5 mg by mouth every evening.), Disp: 90 tablet, Rfl: 3   Biotin 10000 MCG TABS, Take 10,000 mcg by mouth daily with breakfast., Disp: , Rfl:    cilostazol (PLETAL) 50 MG tablet, TAKE 1 TABLET BY MOUTH TWICE A DAY, Disp: 180 tablet, Rfl: 1   citalopram (CELEXA) 20 MG tablet, TAKE 1 TABLET BY MOUTH EVERY DAY (Patient taking differently: Take 20 mg by mouth daily.), Disp: 90 tablet, Rfl: 2   CRANBERRY PO, Take 1 capsule by mouth 2 (two) times daily with a meal., Disp: , Rfl:    dorzolamide-timolol (COSOPT) 2-0.5 % ophthalmic solution, Place 1 drop into both eyes 2 (two) times daily., Disp: , Rfl:    ezetimibe (ZETIA) 10 MG tablet, TAKE 1 TABLET BY MOUTH EVERY DAY (Patient taking differently: Take 10 mg by mouth daily.), Disp: 90 tablet, Rfl: 0   feeding supplement, ENSURE COMPLETE, (ENSURE COMPLETE) LIQD, Take 237 mLs by mouth 3 (three) times daily between meals., Disp: , Rfl:    hydrALAZINE (APRESOLINE) 25 MG tablet, Take 3 tablets (75 mg total) by mouth 2 (two) times daily., Disp: 180 tablet, Rfl: 0   latanoprost (XALATAN) 0.005 % ophthalmic solution, Place 1 drop into both eyes daily., Disp: , Rfl:    LIVALO 2 MG TABS, Take 1 tablet by mouth  daily., Disp: , Rfl:    metoprolol succinate (TOPROL-XL) 100 MG 24 hr tablet, TAKE 1 TABLET BY MOUTH EVERY EVENING. (Patient taking differently: Take 100 mg by mouth daily with supper.), Disp: 90 tablet, Rfl: 3   mirtazapine (REMERON SOL-TAB) 15 MG disintegrating tablet, TAKE 1 TABLET BY MOUTH EVERYDAY AT BEDTIME (Patient not taking: Reported on 05/25/2022), Disp: 90 tablet, Rfl: 0   omega-3 acid ethyl esters (LOVAZA) 1 g capsule, TAKE 1 CAPSULE (1 G TOTAL) BY MOUTH EVERY EVENING., Disp: 90 capsule, Rfl: 3   pantoprazole (PROTONIX) 40 MG tablet, TAKE 1 TABLET (40 MG TOTAL) BY MOUTH DAILY AS NEEDED (FOR ACID REFLUX). (Patient taking differently: Take 40 mg by mouth daily as needed (For acid reflux).), Disp: 90 tablet, Rfl: 3   sulfamethoxazole-trimethoprim (BACTRIM DS) 800-160 MG tablet, Take 1 tablet by mouth 2 (two) times daily., Disp: 20 tablet, Rfl: 0  Exam: Current vital signs: BP (!) 195/133 (BP Location: Right Arm)   Pulse (!) 120   Temp 97.8 F (36.6 C) (Axillary)   Resp 18   Ht 5\' 4"  (1.626 m)   Wt 61.3 kg   SpO2 100%   BMI 23.20 kg/m  Vital signs in last 24 hours: Temp:  [97.8 F (36.6 C)-98.1 F (36.7 C)] 97.8 F (36.6 C) (03/28 1455) Pulse Rate:  [58-120] 120 (03/28 1455) Resp:  [11-18] 18 (03/28 1455) BP: (174-199)/(85-133) 195/133 (03/28 1455) SpO2:  [98 %-100 %] 100 % (03/28 1455) Weight:  [61.3 kg] 61.3 kg (03/28 1137)  GENERAL: Awake, alert, cachectic appearing female, in no acute distress Psych: Affect appropriate for situation, patient is calm and cooperative with examination Head: Normocephalic and atraumatic, without obvious abnormality EENT: Normal conjunctivae, dry mucous membranes, right cornea is opaque LUNGS: Normal respiratory effort. Non-labored breathing on room air CV: Regular rate and rhythm on telemetry ABDOMEN: Soft, non-tender, non-distended Extremities: Warm, well perfused, without obvious deformity  NEURO:  Mental Status: Awake, alert, and  oriented to self and place.  Patient is unable to state her age or the current month but this is consistent with her baseline mental status. Speech is hypophonic, slowed as per baseline. Minimal dysarthria, no evidence of aphasia. Cranial Nerves:  II: Unable to assess right pupil due to chronic opaque/hazy cornea.  Left pupil is briskly reactive to light. III, IV, VI: EOMI no nystagmus or ptosis. 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  Sensation is intact to light touch in face bilaterally. VII: Face is symmetric resting and with movement. VIII: Patient is slightly hard of hearing. IX, X: Patient is hypophonic. XI: Normal sternocleidomastoid and trapezius muscle strength XII: Tongue protrudes midline without fasciculations.   Motor: Patient is able to elevate bilateral upper extremities antigravity without vertical drift.  Patient is able to move bilateral lower extremities without gravity.  Patient does elevate bilateral lower extremities antigravity briefly. Sensation: Intact to light touch bilaterally in all four extremities.  Gait: Deferred for patient's safety  Labs I have reviewed labs in epic and the results pertinent to this consultation are: CBC    Component Value Date/Time   WBC 7.7 10/18/2022 0520   RBC 4.42 10/18/2022 0520   HGB 14.0 10/18/2022 0520   HGB 14.3 05/03/2022 1543   HCT 44.3 10/18/2022 0520   HCT 41.7 05/03/2022 1543   PLT 208 10/18/2022 0520   PLT 288 05/03/2022 1543   MCV 100.2 (H) 10/18/2022 0520   MCV 95 05/03/2022 1543   MCH 31.7 10/18/2022 0520   MCHC 31.6 10/18/2022 0520   RDW 14.2 10/18/2022 0520   RDW 12.3 05/03/2022 1543   LYMPHSABS 1.8 10/14/2022 0410   LYMPHSABS 1.8 05/03/2022 1543   MONOABS 0.6 10/14/2022 0410   EOSABS 0.2 10/14/2022 0410   EOSABS 0.1 05/03/2022 1543   BASOSABS 0.0 10/14/2022 0410   BASOSABS 0.0 05/03/2022 1543   CMP     Component Value Date/Time   NA 140 10/18/2022 0520   NA 141 05/03/2022 1543   K 4.0 10/18/2022 0520   CL  110 10/18/2022 0520   CO2 22 10/18/2022 0520   GLUCOSE 109 (H) 10/18/2022 0520   BUN 17 10/18/2022 0520   BUN 14 05/03/2022 1543   CREATININE 0.87 10/18/2022 0520   CREATININE 1.24 (H) 04/05/2017 1331   CALCIUM 9.3 10/18/2022 0520   PROT 6.2 (L) 10/14/2022 0410   PROT 7.5 05/03/2022 1543   ALBUMIN 2.8 (L) 10/14/2022 0410  ALBUMIN 4.5 05/03/2022 1543   AST 36 10/14/2022 0410   ALT 33 10/14/2022 0410   ALKPHOS 46 10/14/2022 0410   BILITOT 0.7 10/14/2022 0410   BILITOT 0.3 05/03/2022 1543   GFRNONAA >60 10/18/2022 0520   GFRNONAA 39 (L) 04/05/2017 1331   GFRAA 47 (L) 06/12/2020 1423   GFRAA 45 (L) 04/05/2017 1331   Lipid Panel     Component Value Date/Time   CHOL 166 08/08/2019 1201   TRIG 91 08/08/2019 1201   HDL 91 08/08/2019 1201   CHOLHDL 1.8 08/08/2019 1201   CHOLHDL 2.2 01/08/2018 0639   VLDL 15 01/08/2018 0639   LDLCALC 59 08/08/2019 1201   Lab Results  Component Value Date   HGBA1C 4.7 (L) 05/25/2022   Urinalysis    Component Value Date/Time   COLORURINE YELLOW 02/10/2023 1432   APPEARANCEUR CLOUDY (A) 02/10/2023 1432   LABSPEC 1.010 02/10/2023 1432   LABSPEC 1.015 06/12/2020 1456   [place] 7.0 02/10/2023 [place] 02/10/2023 1432   HGBUR NEGATIVE 02/10/2023 1432   BILIRUBINUR NEGATIVE 02/10/2023 1432   BILIRUBINUR negative 06/12/2020 1456   BILIRUBINUR n 11/25/2016 1409   KETONESUR NEGATIVE 02/10/2023 1432   PROTEINUR NEGATIVE 02/10/2023 1432   UROBILINOGEN negative 11/25/2016 1409   UROBILINOGEN 1 10/08/2014 1529   NITRITE NEGATIVE 02/10/2023 1432   LEUKOCYTESUR NEGATIVE 02/10/2023 1432   Imaging I have reviewed the images obtained:  CT-scan of the brain: No acute intracranial process  Assessment: 89 year old female with PMHx of HTN, HLD, PAD s/p stenting, osteoporosis, glaucoma, depression, dementia, CKD stage IIIa, NSVT, on home hospice who presented to the ED on 02/10/2023 for evaluation of right mouth droop, slurred/garbled  speech, and unsteady gait at home.  Patient's last known well was prior to bed last night 02/09/2023 at 9 PM.  At the time of neurology evaluation, family notes that patient's symptoms had largely resolved and patient was at her neurologic baseline.  Presentation is concerning for possible TIA.  Patient's family does express desire for stroke workup and possible intervention as necessary in line with her goals of care.   Recommendations: -CT angio head and neck pending once renal function labs result -Consider MRI brain without contrast when able to obtain -Further recommendations pending initial imaging results. -If findings for acute infarct, will expand stroke workup at that time. -Neurology will follow  Pt seen by NP/Neuro and later by MD. Note/plan to be edited by MD as needed.  Anibal Henderson, AGAC-NP [facility] Pager: [phone]

## 2023-02-10 NOTE — ED Provider Notes (Signed)
Signout from [place] PA-C at shift change. Briefly, patient with history of essential hypertension, hyperlipidemia, PAD s/p stenting, osteoporosis, glaucoma, depression, dementia, CKD stage IIIa, NSVT, on home hospice -- presents for new weakness, trouble swallowing and facial droop. Neurology has been consulted. She presented out of the Code Stroke window. Symptoms nearly resolved now.    Plan: Awaiting CTA head/neck and lab work-up, touch base with neurology.    6:48 PM Reassessment performed. Patient appears stable.  Family confirms resolution of slurred speech, facial droop, and difficulty swallowing that was present earlier today.  Labs and imaging personally reviewed and interpreted including: CBC pending, CMP unremarkable; UA without signs of infection.   Reviewed additional pertinent lab work and imaging with patient at bedside including: CTA head and neck   Most current vital signs reviewed and are as follows: BP (!) 195/133 (BP Location: Right Arm)   Pulse (!) 120   Temp 97.8 F (36.6 C) (Axillary)   Resp 18   Ht 5\' 4"  (1.626 m)   Wt 61.3 kg   SpO2 100%   BMI 23.20 kg/m   Plan: I spoke with neurohospitalist to confirm recommendation for admission.  MRI has been ordered.  They will follow.  Plan for medicine admission.  7:38 PM CBC has resulted, unremarkable.  Plan for admission.  Consult to hospitalist placed.   7:58 PM Discussed with Dr. Marcello Moores of [facility] who will see. Requests placement of palliative care consult. I have placed order in EPIC.   BP (!) 208/82 (BP Location: Right Arm)   Pulse 60   Temp 97.6 F (36.4 C) (Oral)   Resp 18   Ht 5\' 4"  (1.626 m)   Wt 61.3 kg   SpO2 100%   BMI 23.20 kg/m      Carlisle Cater, PA-C 02/10/23 1959    Fransico Meadow, MD 02/11/23 [phone]

## 2023-02-10 NOTE — ED Notes (Signed)
ED TO INPATIENT HANDOFF REPORT  ED Nurse Name and Phone #: Sharyn Lull [phone]  S Name/Age/Gender [name] 89 y.o. female Room/Bed: 028C/028C  Code Status   Code Status: Prior  Home/SNF/Other Home Patient oriented to: self Is this baseline? Yes   Triage Complete: Triage complete  Chief Complaint TIA (transient ischemic attack) [G45.9]  Triage Note Coming from home for increased lethargy and weakness was reported to ems. For EMS pt warm to touch no temp reported from family   Family last noticed the pt was her normal self was 9pm last night    Allergies Allergies  Allergen Reactions   Crestor [Rosuvastatin Calcium] Other (See Comments)    Muscle pain, myalgia   Lipitor [Atorvastatin Calcium] Other (See Comments)    Muscle pain, myalgia    Level of Care/Admitting Diagnosis ED Disposition     ED Disposition  Admit   Condition  --   [place]: [place] [[id number]]  Level of Care: Progressive [102]  Admit to Progressive based on following criteria: NEUROLOGICAL AND NEUROSURGICAL complex patients with significant risk of instability, who do not meet ICU criteria, yet require close observation or frequent assessment (< / = every 2 - 4 hours) with medical / nursing intervention.  May admit patient to [facility] or [facility] if equivalent level of care is available:: No  Covid Evaluation: Symptomatic Person Under Investigation (PUI) or recent exposure (last 10 days) *Testing Required*  Diagnosis: TIA (transient ischemic attack) [id number]  Admitting Physician: Clance Boll [id number]  Attending Physician: Clance Boll [id number]  Certification:: I certify this patient will need inpatient services for at least 2 midnights  Estimated Length of Stay: 3          B Medical/Surgery History Past Medical History:  Diagnosis Date   Arthritis    Chronic kidney disease    KIDNEY STONES   Dementia ([place])    BEGINNING  STAGES    Depression    GERD (gastroesophageal reflux disease)    Glaucoma    Hiatal hernia    HTN (hypertension) 10/18/2011   Hyperlipemia 10/18/2011   NSVT (nonsustained ventricular tachycardia) ([place]) 10/18/2011   Osteoporosis    PAD (peripheral artery disease) ([place]) 10/18/2011   h/o left SFA stent   Past Surgical History:  Procedure Laterality Date   [place]     2012   CHOLECYSTECTOMY     IR THORACENTESIS ASP PLEURAL SPACE W/IMG GUIDE  10/14/2022   LOOP RECORDER IMPLANT N/A 08/27/2014   Procedure: LOOP RECORDER IMPLANT;  Surgeon: Coralyn Mark, MD;  Location: [facility];  Service: Cardiovascular;  Laterality: N/A;   LOOP RECORDER REMOVAL N/A 01/09/2018   Procedure: LOOP RECORDER REMOVAL;  Surgeon: Evans Lance, MD;  Location: [facility];  Service: Cardiovascular;  Laterality: N/A;   LOWER EXTREMITY ANGIOGRAM  11/02/2007   stent of mid left SFA with 6x130mm EV3 self-expanding stent and 6x3 in prox region (Dr. Adora Fridge)   [place] ARTERIAL DOPPLER  2013   right SFA with 50-69% diameter reduction, right PTA/peroneal occluded, L SFA prox to stent has narrowing with increased velocities >60% diameter reduction, L SFA stent patent, L ATA w/occlusive disease, bilat ABIs show mild arterial insuffiency at rest   NM MYOCAR PERF WALL MOTION  10/2011   non-gated - normal stuy, EF 82%, normal LV wall motion   PACEMAKER IMPLANT  N/A 01/09/2018   Procedure: PACEMAKER IMPLANT;  Surgeon: Evans Lance, MD;  Location: [facility];  Service: Cardiovascular;  Laterality: N/A;   REVERSE SHOULDER ARTHROPLASTY Right 01/26/2013   Procedure: RIGHT REVERSE TOTAL SHOULDER ARTHROPLASTY;  Surgeon: Augustin Schooling, MD;  Location: [facility];  Service: Orthopedics;  Laterality: Right;   TRANSTHORACIC ECHOCARDIOGRAM  10/2012   EF 75-70%, mod conc hypertrophy, severely calcified MV annulus, LA mildly dailted, PA peak  pressure 25mmHg     A IV Location/Drains/Wounds Patient Lines/Drains/Airways Status     Active Line/Drains/Airways     Name Placement date Placement time Site Days   Peripheral IV 10/16/22 18 G 2.5" Anterior;Right Forearm 10/16/22  0141  Forearm  117   Peripheral IV 02/10/23 22 G Left Hand 02/10/23  1136  Hand  less than 1   Peripheral IV 02/10/23 20 G Right Antecubital 02/10/23  1547  Antecubital  less than 1   External Urinary Catheter 10/14/22  2100  --  119   Pressure Injury 10/14/22 Sacrum Stage 2 -  Partial thickness loss of dermis presenting as a shallow open injury with a red, pink wound bed without slough. 10/14/22  2130  -- 119            Intake/Output Last 24 hours No intake or output data in the 24 hours ending 02/10/23 2039  Labs/Imaging Results for orders placed or performed during the hospital encounter of 02/10/23 (from the past 48 hour(s))  Urinalysis, Routine w reflex microscopic -Urine, Clean Catch     Status: Abnormal   Collection Time: 02/10/23  2:32 PM  Result Value Ref Range   Color, Urine YELLOW YELLOW   APPearance CLOUDY (A) CLEAR   Specific Gravity, Urine 1.010 1.005 - 1.030   pH 7.0 5.0 - 8.0   Glucose, UA NEGATIVE NEGATIVE mg/dL   Hgb urine dipstick NEGATIVE NEGATIVE   Bilirubin Urine NEGATIVE NEGATIVE   Ketones, ur NEGATIVE NEGATIVE mg/dL   Protein, ur NEGATIVE NEGATIVE mg/dL   Nitrite NEGATIVE NEGATIVE   Leukocytes,Ua NEGATIVE NEGATIVE    Comment: Performed at [facility] [address], [place], [place] [address]  I-stat chem 8, ED (not at [facility], [facility] or [facility])     Status: Abnormal   Collection Time: 02/10/23  3:42 PM  Result Value Ref Range   Sodium 137 135 - 145 mmol/L   Potassium 7.5 (HH) 3.5 - 5.1 mmol/L   Chloride 114 (H) 98 - 111 mmol/L   BUN 12 8 - 23 mg/dL   Creatinine, Ser 0.80 0.44 - 1.00 mg/dL   Glucose, Bld 95 70 - 99 mg/dL    Comment: Glucose reference range applies only to samples taken after fasting for at least 8  hours.   Calcium, Ion 0.85 (LL) 1.15 - 1.40 mmol/L   TCO2 24 22 - 32 mmol/L   Hemoglobin 16.3 (H) 12.0 - 15.0 g/dL   HCT 48.0 (H) 36.0 - 46.0 %   Comment NOTIFIED PHYSICIAN   Comprehensive metabolic panel     Status: Abnormal   Collection Time: 02/10/23  3:58 PM  Result Value Ref Range   Sodium 143 135 - 145 mmol/L   Potassium 4.9 3.5 - 5.1 mmol/L   Chloride 108 98 - 111 mmol/L   CO2 23 22 - 32 mmol/L   Glucose, Bld 95 70 - 99 mg/dL    Comment: Glucose reference range applies only to samples taken after fasting for at least 8 hours.  BUN 9 8 - 23 mg/dL   Creatinine, Ser 0.90 0.44 - 1.00 mg/dL   Calcium 9.5 8.9 - 10.3 mg/dL   Total Protein 6.9 6.5 - 8.1 g/dL   Albumin 3.8 3.5 - 5.0 g/dL   AST 46 (H) 15 - 41 U/L   ALT 35 0 - 44 U/L   Alkaline Phosphatase 43 38 - 126 U/L   Total Bilirubin 0.8 0.3 - 1.2 mg/dL   GFR, Estimated 59 (L) >60 mL/min    Comment: (NOTE) Calculated using the CKD-EPI Creatinine Equation (2021)    Anion gap 12 5 - 15    Comment: Performed at [facility] [address] [place], [place] [address]  CBC with Differential/Platelet     Status: None   Collection Time: 02/10/23  7:00 PM  Result Value Ref Range   WBC 6.0 4.0 - 10.5 K/uL   RBC 4.53 3.87 - 5.11 MIL/uL   Hemoglobin 14.3 12.0 - 15.0 g/dL   HCT 45.1 36.0 - 46.0 %   MCV 99.6 80.0 - 100.0 fL   MCH 31.6 26.0 - 34.0 pg   MCHC 31.7 30.0 - 36.0 g/dL   RDW 13.4 11.5 - 15.5 %   Platelets 185 150 - 400 K/uL   nRBC 0.0 0.0 - 0.2 %   Neutrophils Relative % 61 %   Neutro Abs 3.7 1.7 - 7.7 K/uL   Lymphocytes Relative 28 %   Lymphs Abs 1.7 0.7 - 4.0 K/uL   Monocytes Relative 8 %   Monocytes Absolute 0.5 0.1 - 1.0 K/uL   Eosinophils Relative 2 %   Eosinophils Absolute 0.1 0.0 - 0.5 K/uL   Basophils Relative 1 %   Basophils Absolute 0.0 0.0 - 0.1 K/uL   Immature Granulocytes 0 %   Abs Immature Granulocytes 0.01 0.00 - 0.07 K/uL    Comment: Performed at [facility], [address],  [place], [place] [address]   CT ANGIO HEAD NECK W WO CM  Result Date: 02/10/2023 CLINICAL DATA:  Increased lethargy and weakness EXAM: CT ANGIOGRAPHY HEAD AND NECK TECHNIQUE: Multidetector CT imaging of the head and neck was performed using the standard protocol during bolus administration of intravenous contrast. Multiplanar CT image reconstructions and MIPs were obtained to evaluate the vascular anatomy. Carotid stenosis measurements (when applicable) are obtained utilizing NASCET criteria, using the distal internal carotid diameter as the denominator. RADIATION DOSE REDUCTION: This exam was performed according to the departmental dose-optimization program which includes automated exposure control, adjustment of the mA and/or kV according to patient size and/or use of iterative reconstruction technique. CONTRAST:  32mL OMNIPAQUE IOHEXOL 350 MG/ML SOLN COMPARISON:  No prior CTA available, correlation is made with CT head 02/10/2023 FINDINGS: CT HEAD FINDINGS For noncontrast findings, please see same day CT head. CTA NECK FINDINGS Aortic arch: Standard branching. Imaged portion shows no evidence of aneurysm or dissection. No significant stenosis of the major arch vessel origins. Aortic atherosclerosis. Right carotid system: No evidence of dissection, occlusion, or hemodynamically significant stenosis (greater than 50%). Left carotid system: No evidence of dissection, occlusion, or hemodynamically significant stenosis (greater than 50%). Vertebral arteries: Moderate stenosis at the origin of the right vertebral artery. Mild stenosis at the origin of the left vertebral artery. The vertebral arteries are otherwise patent to the skull base without significant stenosis. No evidence of dissection. Skeleton: No acute osseous abnormality. Degenerative changes in the cervical spine. 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 Smaller hyperenhancing mass right parotid gland measures 1.2 x 1.3 x 1.1 cm (series 8, image 77), previously 1.1 x 1.1 x 1.2 cm on the prior chest CT. Upper chest: No focal pulmonary opacity or pleural effusion. Review of the MIP images confirms the above findings CTA HEAD FINDINGS Anterior circulation: Both internal carotid arteries are patent to the termini, with mild-to-moderate stenosis in the bilateral cavernous and supraclinoid segments. A1 segments patent. Normal anterior communicating artery. Anterior cerebral arteries are patent to their distal aspects without significant stenosis. No M1 stenosis or occlusion. MCA branches perfused to their distal aspects without significant stenosis. Posterior circulation: Vertebral arteries patent to the vertebrobasilar junction with multifocal moderate stenosis in the left V4 and moderate stenosis in the distal right V4. Posterior inferior cerebellar arteries patent proximally. 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 Superior cerebellar arteries patent proximally. Hypoplastic, patent P1 segments. Near fetal origin of the bilateral PCAs from the posterior communicating arteries. Multifocal irregularity and mild-to-moderate stenosis in the bilateral P2 segments (series 8, images 120 1-123, for example). Venous sinuses: As permitted by contrast timing, patent. Anatomic variants: Near fetal origin of the bilateral PCAs. Review of the MIP images confirms the above findings IMPRESSION: 1. 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 Of note, there are near fetal origins of the bilateral PCAs, so that the  majority of the PCA blood supply comes from the anterior circulation. 2. Multifocal moderate stenosis in the bilateral V4 segments. Multifocal irregularity and mild-to-moderate stenosis in the bilateral intracranial ICA and bilateral P2 segments. 3. Moderate stenosis at the origin of the right vertebral artery and mild stenosis at the origin of the left vertebral artery. No other hemodynamically significant stenosis in the neck. 4. 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 Smaller hyperenhancing mass in the right parotid gland measures up to 1.3 cm, which is also unchanged. 5. Aortic atherosclerosis. Aortic Atherosclerosis (ICD10-I70.0). Electronically Signed   By: Merilyn Baba M.D.   On: 02/10/2023 17:51   CT Head Wo Contrast  Result Date: 02/10/2023 CLINICAL DATA:  Facial paralysis/weakness EXAM: CT HEAD WITHOUT CONTRAST TECHNIQUE: Contiguous axial images were obtained from the base of the skull through the vertex without intravenous contrast. RADIATION DOSE REDUCTION: This exam was performed according to the departmental dose-optimization program which includes automated exposure control, adjustment of the mA and/or kV according to patient size and/or use of iterative reconstruction technique. COMPARISON:  10/13/2022 FINDINGS: Brain: No evidence of acute infarction, hemorrhage, mass, mass effect, or midline shift. No hydrocephalus or extra-axial fluid collection. Redemonstrated diffuse cerebral atrophy with prominence of the CSF spaces. Periventricular white matter changes, likely the sequela of chronic small vessel ischemic disease. Basal ganglia calcifications. Vascular: No hyperdense vessel. Atherosclerotic calcifications in the intracranial carotid and vertebral arteries. Skull: Negative for fracture or focal lesion. Sinuses/Orbits: Mucous retention cyst in the right  maxillary sinus. Mild mucosal thickening in the ethmoid air cells. Status post bilateral lens replacements. Other: The  mastoid air cells are well aerated. IMPRESSION: No acute intracranial process. Electronically Signed   By: Merilyn Baba M.D.   On: 02/10/2023 14:39   DG Chest Port 1 View  Result Date: 02/10/2023 CLINICAL DATA:  Altered mental status, increased lethargy and weakness EXAM: PORTABLE CHEST 1 VIEW COMPARISON:  10/14/2022 FINDINGS: Transverse diameter of heart is increased. Thoracic aorta is tortuous and ectatic. Lung fields are clear of any infiltrate or pulmonary edema. There is interval improvement in the aeration in the lower lung fields. There is no pleural effusion or pneumothorax. Pacemaker battery is seen in left infraclavicular region with tips of leads in right atrium and right ventricle. There is previous reverse arthroplasty right shoulder. Surgical clips are seen in right upper quadrant of abdomen. IMPRESSION: There are no signs of pulmonary edema or focal pulmonary consolidation. Electronically Signed   By: Elmer Picker M.D.   On: 02/10/2023 12:41    Pending Labs Unresulted Labs (From admission, onward)     Start     Ordered   02/10/23 1420  CBC with Differential/Platelet  Once,   STAT        02/10/23 1420   02/10/23 1220  CBC with Differential  Once,   STAT        02/10/23 1221            Vitals/Pain Today's Vitals   02/10/23 1455 02/10/23 1515 02/10/23 1846 02/10/23 2033  BP: (!) 195/133 (!) 197/91 (!) 208/82 (!) 187/83  Pulse: (!) 120 60 60 (!) 59  Resp: 18 12 18 14   Temp: 97.8 F (36.6 C)  97.6 F (36.4 C)   TempSrc: Axillary  Oral   SpO2: 100% 99% 100% 99%  Weight:      Height:        Isolation Precautions No active isolations  Medications Medications  iohexol (OMNIPAQUE) 350 MG/ML injection 75 mL (75 mLs Intravenous Contrast Given 02/10/23 1718)  amLODipine (NORVASC) tablet 5 mg (5 mg Oral Given 02/10/23 2036)     Mobility non-ambulatory     Focused Assessments Neuro Assessment Handoff:  Swallow screen pass? Yes  Cardiac Rhythm: Heart block, Normal sinus rhythm       Neuro Assessment:   Neuro Checks:      Has TPA been given? No If patient is a Neuro Trauma and patient is going to OR before floor call report to [place] nurse: [phone] or [phone]   R Recommendations: See Admitting Provider Note  Report given to:   Additional Notes: Pt has soft diet at home

## 2023-02-11 ENCOUNTER — Inpatient Hospital Stay (HOSPITAL_BASED_OUTPATIENT_CLINIC_OR_DEPARTMENT_OTHER)

## 2023-02-11 DIAGNOSIS — F03918 Unspecified dementia, unspecified severity, with other behavioral disturbance: Secondary | ICD-10-CM | POA: Diagnosis not present

## 2023-02-11 DIAGNOSIS — N1831 Chronic kidney disease, stage 3a: Secondary | ICD-10-CM | POA: Diagnosis not present

## 2023-02-11 DIAGNOSIS — G459 Transient cerebral ischemic attack, unspecified: Secondary | ICD-10-CM

## 2023-02-11 DIAGNOSIS — E782 Mixed hyperlipidemia: Secondary | ICD-10-CM | POA: Diagnosis not present

## 2023-02-11 LAB — RESPIRATORY PANEL BY PCR

## 2023-02-11 LAB — URINALYSIS, ROUTINE W REFLEX MICROSCOPIC
Bacteria, UA: NONE SEEN
Bilirubin Urine: NEGATIVE
Glucose, UA: NEGATIVE mg/dL
Hgb urine dipstick: NEGATIVE
Ketones, ur: NEGATIVE mg/dL
Leukocytes,Ua: NEGATIVE
Nitrite: NEGATIVE
Protein, ur: 30 mg/dL — AB
Specific Gravity, Urine: 1.029 (ref 1.005–1.030)
pH: 7 (ref 5.0–8.0)

## 2023-02-11 LAB — LIPID PANEL
Cholesterol: 239 mg/dL — ABNORMAL HIGH (ref 0–200)
HDL: 81 mg/dL (ref >40)
LDL Cholesterol: 136 mg/dL — ABNORMAL HIGH (ref 0–99)
Total CHOL/HDL Ratio: 3 RATIO
Triglycerides: 108 mg/dL (ref <150)
VLDL: 22 mg/dL (ref 0–40)

## 2023-02-11 LAB — ECHOCARDIOGRAM LIMITED BUBBLE STUDY

## 2023-02-11 MED ORDER — ASPIRIN 81 MG PO TBEC: 81.0000 mg | DELAYED_RELEASE_TABLET | Freq: Every day | ORAL | 11 refills | Status: DC

## 2023-02-11 MED ORDER — CLOPIDOGREL BISULFATE 75 MG PO TABS: 75.0000 mg | ORAL_TABLET | Freq: Every day | ORAL | 0 refills | Status: AC

## 2023-02-11 NOTE — Discharge Summary (Signed)
Physician Discharge Summary   Patient: Julie Bass MRN: [id number] DOB: 1926-03-06  Admit date:     02/10/2023  Discharge date: 02/11/23  Discharge Physician: Annita Brod   PCP: Denita Lung, MD   Recommendations at discharge:   New medication: Plavix 75 mg p.o. daily x 3 weeks New medication: Aspirin 81 mg p.o. daily Patient returning back home with hospice  Discharge Diagnoses: Principal Problem:   TIA (transient ischemic attack)  Resolved Problems:   * No resolved hospital problems. *  Hospital Course: 89 year old female with past medical history of hypertension, PAD status post stent, stage IIIa CKD, depression and dementia currently on home hospice who was brought in by family to the emergency room on 3/28 with garbled speech, right-sided facial droop and what appeared to be difficulty swallowing.  Following presentation to the emergency room, patient's symptoms resolved.  CT scan of head negative for acute findings.  Patient unable to get MRI due to pacemaker.  CT angiogram noted multifocal severe stenosis and poor opacification of the basilar artery which appears to be patent, multifocal moderate stenosis in the bilateral V4 segments and moderate stenosis at origin of right vertebral artery.  Assessment and Plan: TIA: Symptoms resolved soon after presentation to ER.  Initial CT scan of head unremarkable.  Unable to get MRI due to pacemaker.  Discussed with family and they agreed would not want to try to risk that.  CT angiogram noted multiple areas of stenosis.  Suspected small vessel disease and patient put on aspirin plus Plavix for 3 weeks and then aspirin indefinitely.  In review of additional risk factors, patient with significant hyperlipidemia however has myalgias with statins.  Discussed this with patient's daughter and we agreed no additional statins given patient's age and hospice situation.  Stage IIIa CKD: At baseline  Senile dementia with behavioral  disturbance: Behavioral disturbance resolved, back to baseline.  Disturbance felt to be secondary to acute metabolic encephalopathy brought on by TIA, now resolved.  Back home with hospice.        Consultants: Neurology Procedures performed: Echocardiogram Disposition: Home with hospice Diet recommendation:  Discharge Diet Orders (From admission, onward)     Start     Ordered   02/11/23 0000  Diet - low sodium heart healthy        02/11/23 1601           Heart healthy although given patient is on hospice, regular diet appropriate DISCHARGE MEDICATION: Allergies as of 02/11/2023       Reactions   Crestor [rosuvastatin Calcium] Other (See Comments)   Muscle pain, myalgia   Lipitor [atorvastatin Calcium] Other (See Comments)   Muscle pain, myalgia        Medication List     TAKE these medications    ALPRAZolam 0.25 MG tablet Commonly known as: XANAX Take 0.25 mg by mouth every 6 (six) hours as needed for anxiety or sleep.   amLODipine 5 MG tablet Commonly known as: NORVASC Take 1 tablet (5 mg total) by mouth every evening. Keep appointment for further refills. What changed: additional instructions   aspirin EC 81 MG tablet Take 1 tablet (81 mg total) by mouth daily. Swallow whole.   citalopram 20 MG tablet Commonly known as: CELEXA TAKE 1 TABLET BY MOUTH EVERY DAY   clopidogrel 75 MG tablet Commonly known as: Plavix Take 1 tablet (75 mg total) by mouth daily for 21 days.   CRANBERRY PO Take 1 capsule by mouth 2 (  two) times daily with a meal.   dorzolamide-timolol 2-0.5 % ophthalmic solution Commonly known as: COSOPT Place 1 drop into both eyes 2 (two) times daily.   feeding supplement (ENSURE COMPLETE) Liqd Take 237 mLs by mouth 3 (three) times daily between meals. What changed:  when to take this reasons to take this   hydrALAZINE 25 MG tablet Commonly known as: APRESOLINE Take 3 tablets (75 mg total) by mouth 2 (two) times daily.    latanoprost 0.005 % ophthalmic solution Commonly known as: XALATAN Place 1 drop into both eyes daily.   metoprolol succinate 100 MG 24 hr tablet Commonly known as: TOPROL-XL TAKE 1 TABLET BY MOUTH EVERY EVENING. What changed: when to take this   mirtazapine 15 MG tablet Commonly known as: REMERON Take 15 mg by mouth at bedtime.   MULTIVITAMIN PO Take 1 tablet by mouth daily.   pantoprazole 40 MG tablet Commonly known as: PROTONIX TAKE 1 TABLET (40 MG TOTAL) BY MOUTH DAILY AS NEEDED (FOR ACID REFLUX). What changed: reasons to take this        Discharge Exam: Filed Weights   02/10/23 1137  Weight: 61.3 kg   General: Alert and oriented x 1-2, no acute distress Cardiovascular: Regular rate and rhythm, S1-S2  Condition at discharge: fair  The results of significant diagnostics from this hospitalization (including imaging, microbiology, ancillary and laboratory) are listed below for reference.   Imaging Studies: ECHOCARDIOGRAM LIMITED BUBBLE STUDY  Result Date: 02/11/2023    ECHOCARDIOGRAM LIMITED REPORT   Patient Name:   Julie Bass Date of Exam: 02/11/2023 Medical Rec #:  [id number]        Height:       64.0 in Accession #:    [id number]       Weight:       135.1 lb Date of Birth:  Oct 21, 1926        BSA:          1.656 m Patient Age:    89 years         BP:           149/72 mmHg Patient Gender: F                HR:           60 bpm. Exam Location:  Inpatient Procedure: Saline Contrast Bubble Study, Limited Color Doppler and Limited Echo Indications:    Stroke  History:        Patient has prior history of Echocardiogram examinations, most                 recent 10/15/2022. CHF, TIA and PAD, Signs/Symptoms:Syncope; Risk                 Factors:Hypertension.  Sonographer:    Marella Chimes Referring Phys: [id number] Wallace  1. LVOT obstruction. Left ventricular ejection fraction, by estimation, is 60 to 65%. The left ventricle has normal function. The left  ventricle has no regional wall motion abnormalities. There is severe asymmetric left ventricular hypertrophy of the basal-septal segment.  2. Right ventricular systolic function is normal. The right ventricular size is normal.  3. Severe mitral annular calcification.  4. Agitated saline contrast bubble study was negative, with no evidence of any interatrial shunt. FINDINGS  Left Ventricle: LVOT obstruction. Left ventricular ejection fraction, by estimation, is 60 to 65%. The left ventricle has normal function. The left ventricle has no regional wall motion abnormalities. The left ventricular  internal cavity size was normal  in size. There is severe asymmetric left ventricular hypertrophy of the basal-septal segment. Right Ventricle: The right ventricular size is normal. Right ventricular systolic function is normal. Mitral Valve: Severe mitral annular calcification. IAS/Shunts: Agitated saline contrast was given intravenously to evaluate for intracardiac shunting. Agitated saline contrast bubble study was negative, with no evidence of any interatrial shunt. Phineas Inches Electronically signed by Phineas Inches Signature Date/Time: 02/11/2023/2:25:18 PM    Final    DG Chest 2 View  Result Date: 02/10/2023 CLINICAL DATA:  Stroke and fatigue. EXAM: CHEST - 2 VIEW COMPARISON:  02/10/2023 FINDINGS: Cardiac pacemaker. Previous reverse right shoulder arthroplasty. Heart size and pulmonary vascularity are normal for technique. Emphysematous changes and scattered fibrosis in the lungs. No focal consolidation or airspace disease. No pleural effusions. No pneumothorax. Tortuous aorta. IMPRESSION: Emphysematous changes in the lungs. No evidence of active pulmonary disease. Electronically Signed   By: Lucienne Capers M.D.   On: 02/10/2023 23:58   CT ANGIO HEAD NECK W WO CM  Result Date: 02/10/2023 CLINICAL DATA:  Increased lethargy and weakness EXAM: CT ANGIOGRAPHY HEAD AND NECK TECHNIQUE: Multidetector CT imaging of the head  and neck was performed using the standard protocol during bolus administration of intravenous contrast. Multiplanar CT image reconstructions and MIPs were obtained to evaluate the vascular anatomy. Carotid stenosis measurements (when applicable) are obtained utilizing NASCET criteria, using the distal internal carotid diameter as the denominator. RADIATION DOSE REDUCTION: This exam was performed according to the departmental dose-optimization program which includes automated exposure control, adjustment of the mA and/or kV according to patient size and/or use of iterative reconstruction technique. CONTRAST:  50mL OMNIPAQUE IOHEXOL 350 MG/ML SOLN COMPARISON:  No prior CTA available, correlation is made with CT head 02/10/2023 FINDINGS: CT HEAD FINDINGS For noncontrast findings, please see same day CT head. CTA NECK FINDINGS Aortic arch: Standard branching. Imaged portion shows no evidence of aneurysm or dissection. No significant stenosis of the major arch vessel origins. Aortic atherosclerosis. Right carotid system: No evidence of dissection, occlusion, or hemodynamically significant stenosis (greater than 50%). Left carotid system: No evidence of dissection, occlusion, or hemodynamically significant stenosis (greater than 50%). Vertebral arteries: Moderate stenosis at the origin of the right vertebral artery. Mild stenosis at the origin of the left vertebral artery. The vertebral arteries are otherwise patent to the skull base without significant stenosis. No evidence of dissection. Skeleton: No acute osseous abnormality. Degenerative changes in the cervical spine. 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 Smaller hyperenhancing mass right parotid gland measures 1.2 x 1.3 x 1.1 cm (series 8, image 77), previously 1.1 x 1.1 x 1.2 cm on the prior chest CT. Upper chest: No focal pulmonary opacity or pleural effusion. Review of the MIP images confirms the above findings CTA HEAD FINDINGS Anterior circulation: Both internal carotid arteries are patent to the termini, with mild-to-moderate stenosis in the bilateral cavernous and supraclinoid segments. A1 segments patent. Normal anterior communicating artery. Anterior cerebral arteries are patent to their distal aspects without significant stenosis. No M1 stenosis or occlusion. MCA branches perfused to their distal aspects without significant stenosis. Posterior circulation: Vertebral arteries patent to the vertebrobasilar junction  with multifocal moderate stenosis in the left V4 and moderate stenosis in the distal right V4. Posterior inferior cerebellar arteries patent proximally. 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 Superior cerebellar arteries patent proximally. Hypoplastic, patent P1 segments. Near fetal origin of the bilateral PCAs from the posterior communicating arteries. Multifocal irregularity and mild-to-moderate stenosis in the bilateral P2 segments (series 8, images 120 1-123, for example). Venous sinuses: As permitted by contrast timing, patent. Anatomic variants: Near fetal origin of the bilateral PCAs. Review of the MIP images confirms the above findings IMPRESSION: 1. 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 Of note, there are near fetal origins of the bilateral PCAs, so that the majority of the PCA blood supply comes from the anterior circulation. 2. Multifocal moderate stenosis in the bilateral V4 segments. Multifocal irregularity and mild-to-moderate stenosis in the bilateral  intracranial ICA and bilateral P2 segments. 3. Moderate stenosis at the origin of the right vertebral artery and mild stenosis at the origin of the left vertebral artery. No other hemodynamically significant stenosis in the neck. 4. 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 Smaller hyperenhancing mass in the right parotid gland measures up to 1.3 cm, which is also unchanged. 5. Aortic atherosclerosis. Aortic Atherosclerosis (ICD10-I70.0). Electronically Signed   By: Merilyn Baba M.D.   On: 02/10/2023 17:51   CT Head Wo Contrast  Result Date: 02/10/2023 CLINICAL DATA:  Facial paralysis/weakness EXAM: CT HEAD WITHOUT CONTRAST TECHNIQUE: Contiguous axial images were obtained from the base of the skull through the vertex without intravenous contrast. RADIATION DOSE REDUCTION: This exam was performed according to the departmental dose-optimization program which includes automated exposure control, adjustment of the mA and/or kV according to patient size and/or use of iterative reconstruction technique. COMPARISON:  10/13/2022 FINDINGS: Brain: No evidence of acute infarction, hemorrhage, mass, mass effect, or midline shift. No hydrocephalus or extra-axial fluid collection. Redemonstrated diffuse cerebral atrophy with prominence of the CSF spaces. Periventricular white matter changes, likely the sequela of chronic small vessel ischemic disease. Basal ganglia calcifications. Vascular: No hyperdense vessel. Atherosclerotic calcifications in the intracranial carotid and vertebral arteries. Skull: Negative for fracture or focal lesion. Sinuses/Orbits: Mucous retention cyst in the right maxillary sinus. Mild mucosal thickening in the ethmoid air cells. Status post bilateral lens replacements. Other: The mastoid air cells are well aerated. IMPRESSION: No acute intracranial process.  Electronically Signed   By: Merilyn Baba M.D.   On: 02/10/2023 14:39   DG Chest Port 1 View  Result Date: 02/10/2023 CLINICAL DATA:  Altered mental status, increased lethargy and weakness EXAM: PORTABLE CHEST 1 VIEW COMPARISON:  10/14/2022 FINDINGS: Transverse diameter of heart is increased. Thoracic aorta is tortuous and ectatic. Lung fields are clear of any infiltrate or pulmonary edema. There is interval improvement in the aeration in the lower lung fields. There is no pleural effusion or pneumothorax. Pacemaker battery is seen in left infraclavicular region with tips of leads in right atrium and right ventricle. There is previous reverse arthroplasty right shoulder. Surgical clips are seen in right upper quadrant of abdomen. IMPRESSION: There are no signs of pulmonary edema or focal pulmonary consolidation. Electronically Signed   By: Elmer Picker M.D.   On: 02/10/2023 12:41    Microbiology: Results for orders  placed or performed during the hospital encounter of 02/10/23  Respiratory (~20 pathogens) panel by PCR     Status: None   Collection Time: 02/11/23 12:00 AM   Specimen: Nasopharyngeal Swab; Respiratory  Result Value Ref Range Status   Adenovirus NOT DETECTED NOT DETECTED Final   Coronavirus 229E NOT DETECTED NOT DETECTED Final    Comment: (NOTE) The Coronavirus on the Respiratory Panel, DOES NOT test for the novel  Coronavirus (2019 nCoV)    Coronavirus HKU1 NOT DETECTED NOT DETECTED Final   Coronavirus NL63 NOT DETECTED NOT DETECTED Final   Coronavirus OC43 NOT DETECTED NOT DETECTED Final   Metapneumovirus NOT DETECTED NOT DETECTED Final   Rhinovirus / Enterovirus NOT DETECTED NOT DETECTED Final   Influenza A NOT DETECTED NOT DETECTED Final   Influenza B NOT DETECTED NOT DETECTED Final   Parainfluenza Virus 1 NOT DETECTED NOT DETECTED Final   Parainfluenza Virus 2 NOT DETECTED NOT DETECTED Final   Parainfluenza Virus 3 NOT DETECTED NOT DETECTED Final   Parainfluenza  Virus 4 NOT DETECTED NOT DETECTED Final   Respiratory Syncytial Virus NOT DETECTED NOT DETECTED Final   Bordetella pertussis NOT DETECTED NOT DETECTED Final   Bordetella Parapertussis NOT DETECTED NOT DETECTED Final   Chlamydophila pneumoniae NOT DETECTED NOT DETECTED Final   Mycoplasma pneumoniae NOT DETECTED NOT DETECTED Final    Comment: Performed at [facility], [place]. [address] [place], [place] [address]    Labs: CBC: Recent Labs  Lab 02/10/23 1542 02/10/23 1900  WBC  --  6.0  NEUTROABS  --  3.7  HGB 16.3* 14.3  HCT 48.0* 45.1  MCV  --  99.6  PLT  --  123XX123   Basic Metabolic Panel: Recent Labs  Lab 02/10/23 1542 02/10/23 1558  NA 137 143  K 7.5* 4.9  CL 114* 108  CO2  --  23  GLUCOSE 95 95  BUN 12 9  CREATININE 0.80 0.90  CALCIUM  --  9.5   Liver Function Tests: Recent Labs  Lab 02/10/23 1558  AST 46*  ALT 35  ALKPHOS 43  BILITOT 0.8  PROT 6.9  ALBUMIN 3.8   CBG: No results for input(s): "GLUCAP" in the last 168 hours.  Discharge time spent: less than 30 minutes.  Signed: Annita Brod, MD [facility] 02/11/2023

## 2023-02-11 NOTE — Care Management Obs Status (Signed)
[place] NOTIFICATION   Patient Details  Name: Julie Bass MRN: [id number] Date of Birth: 01-01-1926   Medicare Observation Status Notification Given:  Yes    Pollie Friar, RN 02/11/2023, 11:39 AM

## 2023-02-11 NOTE — Plan of Care (Signed)
Discharge instructions discussed with patient  daughter by bedside RN.  Patient instructed on home medications, restrictions, and follow up appointments. Belongings gathered and sent with patient.

## 2023-02-11 NOTE — Hospital Course (Signed)
89 year old female with past medical history of hypertension, PAD status post stent, stage IIIa CKD, depression and dementia currently on home hospice who was brought in by family to the emergency room on 3/28 with garbled speech, right-sided facial droop and what appeared to be difficulty swallowing.  Following presentation to the emergency room, patient's symptoms resolved.  CT scan of head negative for acute findings.  Patient unable to get MRI due to pacemaker.  CT angiogram noted multifocal severe stenosis and poor opacification of the basilar artery which appears to be patent, multifocal moderate stenosis in the bilateral V4 segments and moderate stenosis at origin of right vertebral artery.

## 2023-02-11 NOTE — Progress Notes (Signed)
Neurology Progress Note  Brief HPI: 89 year old patient with history of hypertension, hyperlipidemia, PAD, osteoporosis, glaucoma, depression, dementia, CKD stage IIIa on home hospice presented to the ED on 3/28 after family noticed right-sided facial droop and inability to eat normally alongside dysarthria and rightward leaning with abnormal gait.  Patient presented outside the window for TNK, and presentation was not consistent with an LVO, therefore mechanical thrombectomy was not performed.  Subjective: Patient is oriented to self but disoriented to place time and situation.  She has been restless and states she wants to go home.  Exam: Vitals:   02/11/23 0850 02/11/23 1227  BP: (!) 152/73 (!) 176/97  Pulse: 62   Resp: 16 20  Temp: 98.7 F (37.1 C) 98.6 F (37 C)  SpO2: 98% 98%   Gen: In bed, NAD HEENT: Clouding of right eye noted Resp: non-labored breathing, no acute distress Abd: soft, nt  Neuro: Mental Status: Alert and oriented to self, disoriented to place, time and situation Cranial Nerves: Left pupil round and reactive to light, right not visible, extraocular movements intact, right-sided facial droop, tongue protrusion not cooperative, shoulder shrug symmetrical Motor: Good antigravity strength in all 4 extremities Sensory: Intact to light touch throughout Gait: Deferred  Pertinent Labs:    Latest Ref Rng & Units 02/10/2023    7:00 PM 02/10/2023    3:42 PM 10/18/2022    5:20 AM  CBC  WBC 4.0 - 10.5 K/uL 6.0   7.7   Hemoglobin 12.0 - 15.0 g/dL 14.3  16.3  14.0   Hematocrit 36.0 - 46.0 % 45.1  48.0  44.3   Platelets 150 - 400 K/uL 185   208        Latest Ref Rng & Units 02/10/2023    3:58 PM 02/10/2023    3:42 PM 10/18/2022    5:20 AM  BMP  Glucose 70 - 99 mg/dL 95  95  109   BUN 8 - 23 mg/dL 9  12  17    Creatinine 0.44 - 1.00 mg/dL 0.90  0.80  0.87   Sodium 135 - 145 mmol/L 143  137  140   Potassium 3.5 - 5.1 mmol/L 4.9  7.5  4.0   Chloride 98 - 111 mmol/L  108  114  110   CO2 22 - 32 mmol/L 23   22   Calcium 8.9 - 10.3 mg/dL 9.5   9.3    Lipid Panel     Component Value Date/Time   CHOL 239 (H) 02/11/2023 0734   CHOL 166 08/08/2019 1201   TRIG 108 02/11/2023 0734   HDL 81 02/11/2023 0734   HDL 91 08/08/2019 1201   CHOLHDL 3.0 02/11/2023 0734   VLDL 22 02/11/2023 0734   LDLCALC 136 (H) 02/11/2023 0734   LDLCALC 59 08/08/2019 1201   LABVLDL 16 08/08/2019 1201     Imaging Reviewed:  CT head: No acute abnormality  CT angiogram head and neck: Multifocal severe stenosis of basilar artery, moderate stenosis in bilateral V4 stenosis segments, mild to moderate stenosis in bilateral intracranial ICA and bilateral P2 segments  Assessment: 89 year old patient with the above past medical history on home hospice presents with right-sided facial droop, dysarthria and unsteady gait.  Family initially requested full stroke workup, and labs and echocardiogram have been performed, but now plan is to return patient home with hospice once she is medically optimized.  Patient was unable to receive her MRI due to pacemaker this morning, but it would be possible to  perform the scan later family is in agreement.  Impression: Possible acute ischemic stroke in patient with multiple comorbidities  Recommendations: 1) could still perform MRI brain if family is in agreement 2) neurology will follow if MRI performed and findings are for acute infarct, otherwise plan is to transfer patient home with hospice per primary team  [place] , MSN, AGACNP-BC [facility] See Amion for schedule and pager information 02/11/2023 2:44 PM

## 2023-02-11 NOTE — TOC Transition Note (Signed)
Transition of Care [facility]) - CM/SW Discharge Note   Patient Details  Name: ASHIKA BENTO MRN: [id number] Date of Birth: 04-Apr-1926  Transition of Care [facility] CM/SW Contact:  Pollie Friar, RN Phone Number: 02/11/2023, 11:47 AM   Clinical Narrative:    Pt is from home with her daughter and is active with [facility] for home hospice. Pt has all needed DME at home per daughter.  Daughter provides needed assistance, transportation and medications at home.  Daughter states family will transport home when medically ready.   Final next level of care: Home w Hospice Care Barriers to Discharge: No Barriers Identified   Patient Goals and CMS Choice      Discharge Placement                         Discharge Plan and Services Additional resources added to the After Visit Summary for                                       Social Determinants of Health (SDOH) Interventions SDOH Screenings   Food Insecurity: No Food Insecurity (02/10/2023)  Housing: Low Risk  (02/10/2023)  Transportation Needs: No Transportation Needs (02/10/2023)  Utilities: Not At Risk (02/10/2023)  Depression (PHQ2-9): Low Risk  (05/03/2022)  Tobacco Use: Medium Risk (02/10/2023)     Readmission Risk Interventions     No data to display

## 2023-02-11 NOTE — Evaluation (Signed)
Occupational Therapy Evaluation Patient Details Name: Julie Bass MRN: [id number] DOB: 1926-01-05 Today's Date: 02/11/2023   History of Present Illness Pt is a 89 y.o. F who presents 02/10/2023 with right sided facial droop, garbled/slurred speech, unstable gait. CT head negative for acute infarct. Concern for TIA. Significant PMH: essential hypertension, hyperlipidemia, PAD s/p stenting, osteoporosis, glaucoma, depression, dementia, CKD stage IIIa, NSVT   Clinical Impression   Pt admitted with the above diagnoses and presents with below problem list. Pt will benefit from continued acute OT to address the below listed deficits and maximize independence with basic ADLs prior to d/c home. At baseline, pt lives with family, has a [place] aide, uses walker, mobilizes functional distances only (ie to toilet). Pt currently needs mod A with UB ADLs, max A with LB ADLs.        Recommendations for follow up therapy are one component of a multi-disciplinary discharge planning process, led by the attending physician.  Recommendations may be updated based on patient status, additional functional criteria and insurance authorization.   Assistance Recommended at Discharge Frequent or constant Supervision/Assistance  Patient can return home with the following A lot of help with walking and/or transfers;A lot of help with bathing/dressing/bathroom;Assistance with cooking/housework;Assistance with feeding;Direct supervision/assist for medications management;Direct supervision/assist for financial management;Assist for transportation    Functional Status Assessment  Patient has had a recent decline in their functional status and demonstrates the ability to make significant improvements in function in a reasonable and predictable amount of time.  Equipment Recommendations  None recommended by OT    Recommendations for Other Services       Precautions / Restrictions Precautions Precautions:  Fall Restrictions Weight Bearing Restrictions: No      Mobility Bed Mobility               General bed mobility comments: Pt up in recliner throughout session    Transfers                   General transfer comment: Did not attempt this session d/t pt c/o abdominal pain and indicating she wished to not aggravate that further. Per conversation with PT who worked with her earlier she is currently max A to pivot to chair.      Balance                                           ADL either performed or assessed with clinical judgement   ADL Overall ADL's : Needs assistance/impaired Eating/Feeding: Set up   Grooming: Set up;Minimal assistance   Upper Body Bathing: Moderate assistance   Lower Body Bathing: Maximal assistance;Sit to/from stand;Sitting/lateral leans   Upper Body Dressing : Moderate assistance;Sitting   Lower Body Dressing: Maximal assistance;Sitting/lateral leans;Sit to/from stand                 General ADL Comments: Pt needs setup for feeding (cutting food, straw place in cup, banana peeled, etc)     Vision Baseline Vision/History: 3 Glaucoma       Perception     Praxis      Pertinent Vitals/Pain Pain Assessment Pain Assessment: Faces Faces Pain Scale: Hurts even more Pain Location: "stomach" Pain Descriptors / Indicators: Sharp, Discomfort Pain Intervention(s): Monitored during session, Limited activity within patient's tolerance     Hand Dominance Right   Extremity/Trunk Assessment Upper Extremity Assessment Upper  Extremity Assessment: Generalized weakness   Lower Extremity Assessment Lower Extremity Assessment: Defer to PT evaluation       Communication Communication Communication: Expressive difficulties (dysarthric, soft spoken)   Cognition Arousal/Alertness: Awake/alert Behavior During Therapy: Flat affect Overall Cognitive Status: Difficult to assess                                  General Comments: able to answer simple questions appropriately though some tangential responses (vs HOH)     General Comments       Exercises     Shoulder Instructions      Home Living Family/patient expects to be discharged to:: Private residence Living Arrangements: Children (daughter) Available Help at Discharge: Available 24 hours/day Type of Home: House Home Access: Stairs to enter [employer] of Steps: 4 Entrance Stairs-Rails: Right;Left;Can reach both Home Layout: One level     Bathroom Shower/Tub: Walk-in shower;Door   Bathroom Toilet: Handicapped height Bathroom Accessibility: Yes   Home Equipment: Conservation officer, nature (2 wheels);BSC/3in1;Shower seat;Grab bars - toilet;Grab bars - tub/shower   Additional Comments: Has aide      Prior Functioning/Environment Prior Level of Function : Needs assist             Mobility Comments: uses walker, limited household ambulator          OT Problem List: Decreased strength;Decreased activity tolerance;Impaired balance (sitting and/or standing);Decreased knowledge of use of DME or AE;Decreased knowledge of precautions;Pain      OT Treatment/Interventions: Self-care/ADL training;Balance training;Patient/family education;Therapeutic activities;DME and/or AE instruction;Energy conservation    OT Goals(Current goals can be found in the care plan section) Acute Rehab OT Goals Patient Stated Goal: "when can I go home" OT Goal Formulation: With patient Time For Goal Achievement: 02/25/23 Potential to Achieve Goals: Fair  OT Frequency: Min 2X/week    Co-evaluation              AM-PAC OT "6 Clicks" Daily Activity     Outcome Measure Help from another person eating meals?: A Little Help from another person taking care of personal grooming?: A Little Help from another person toileting, which includes using toliet, bedpan, or urinal?: A Lot Help from another person bathing (including washing, rinsing,  drying)?: A Lot Help from another person to put on and taking off regular upper body clothing?: A Lot Help from another person to put on and taking off regular lower body clothing?: A Lot 6 Click Score: 14   End of Session    Activity Tolerance: Patient limited by pain;Patient limited by fatigue Patient left: in chair;with call bell/phone within reach;with chair alarm set  OT Visit Diagnosis: Unsteadiness on feet (R26.81);Pain;Muscle weakness (generalized) (M62.81)                Time: [id number] OT Time Calculation (min): 15 min Charges:  OT General Charges $OT Visit: 1 Visit OT Evaluation $OT Eval Moderate Complexity: [place], OT Acute Rehabilitation Services Office: [phone]   Hortencia Pilar 02/11/2023, 10:14 AM

## 2023-02-11 NOTE — Progress Notes (Signed)
[facility] - Manufacturing engineer [facility]) - Hospitalized Hospice Patient Note   Julie Bass is a hospitalized hospice patient with an ACC terminal diagnosis of Vascular Dementia with signs and symptoms involving cognitive functions following Cardiovascular Disease.   Patient was transported to the [facility] ED via EMS on 02/09/33 for evaluation of complaints of change in behavior and activity level with swallowing difficulties in the home. Family notified [place] Triage RN via telephone that they were calling EMS to have patient's symptoms evaluated at the hospital.  Patient is admitted with TIA and possible CVA. Per Dr. Tomasa Hosteller, an [facility] physician, this a related admission.   Checked in with bedside RN who reported patient has been slightly agitated/confused this shift so family was on the way to sit with her, otherwise uneventful shift with patient. Visited patient in room who had a [facility] staff member at bedside for safety until family arrives. Patient was sitting up in bed, pleasantly confused in NAD. Patient denied discomfort at this time -  talkative and following commands. It was reported that patient has been tolerating bites of food and has been up to the chair today with assistance of PT.   Spoke with patient daughter to support, and made aware that [facility] will continue to follow patient daily during this hospitalization.    Patient is appropriate for inpatient level of care because of the need for skilled frequent assessment, testing to diagnosis and treat possible CVA.    V/S:  Temp 97.8, HR 62, RR 16, BP 149/72, 98% sats on Room Air I&O: 153.4/Incontinent 2 times Abnormal lab work: Hemo 16.3, HCT 48, K 7.5, Chl 114  Diagnostics:   Awaiting Echocardiogram/MRI on hold (pacemaker) DG Chest: Emphysematous changes in the lungs. No evidence of active pulmonary dx. CT head: No acute intracranial process.  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 Of note, there are near fetal origins of the bilateral PCAs, so that the majority of the PCA blood supply comes from the anterior circulation. 2. Multifocal moderate stenosis in the bilateral V4 segments. Multifocal irregularity and mild-to-moderate stenosis in the bilateral intracranial ICA and bilateral P2 segments. 3. Moderate stenosis at the origin of the right vertebral artery and mild stenosis at the origin of the left vertebral artery. No other hemodynamically significant stenosis in the neck. 4. 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 Smaller hyperenhancing mass in the right parotid gland measures up to 1.3 cm, which is also unchanged. 5. Aortic atherosclerosis.  IVs/PRNs: NONE this shift   EPIC MD Assessment/Plan  TIA r/o CVA -change in ms weakness right sided facial droop garbled speech now resolved   - CT head negative for acute finding , neuro exam  no acute new findings -admit TIA r/o CVA  -Unable to get MRI at this time as patient has pace-maker -asa, plavix  per stroke protocol  -neuro checks , SLP, PT/OT -echo - patient seen by neurology await further recs  Essential hypertension, Uncontrolled  - per family usually well controlled -resume home regimen ( hydralazine , amlodipine,)   Hyperlipidemia -patient not on statin and does not follow a specific diet    PAD s/p stenting - of Cilostazol -as while on hospice meds were adjusted with goal  to minimize medications taken for patient comfort  Depression/ Dementia - on low dose xanax 0.25 mg prn qhs per family -celexa  CKDIIIa --at baseline   Bradycardia -s/p pacemaker   Glaucoma -resume drops once med rec completed  Hx of NSVT - no active issue currently   GERD -ppi   DVT prophylaxis: .heparin    D/C planning- Ongoing assessment - Please  reach out with any hospice related issues. Once medically stable to discharge, family will transport home in private car. If transportation is needed, please contact [facility] for all ACC patient transport.   Goals of Care:  Clear - Patient is a DNR. Family has confirmed wishes to stay on hospice at home upon discharge.  Communication with IDT- Updated [place] team. Transfer Summary and Medication List placed on the shadow chart. Unable to reach/leave msge to notify AOR of hospitalization. Communication with [facility] - Spoke to daughter via telephone to support.   Please call with any hospice related questions or concerns,   Gar Ponto, RN [facility] Liaison (in [place]) [phone]

## 2023-02-11 NOTE — Progress Notes (Signed)
  Echocardiogram 2D Echocardiogram has been performed.  Calandra Madura Renold Don 02/11/2023, 11:55 AM

## 2023-02-11 NOTE — Evaluation (Addendum)
Physical Therapy Evaluation Patient Details Name: Julie Bass MRN: [id number] DOB: 1926-02-01 Today's Date: 02/11/2023  History of Present Illness  Pt is a 89 y.o. F who presents 02/10/2023 with right sided facial droop, garbled/slurred speech, unstable gait. CT head negative for acute infarct. Concern for TIA. Significant PMH: essential hypertension, hyperlipidemia, PAD s/p stenting, osteoporosis, glaucoma, depression, dementia, CKD stage IIIa, NSVT  Clinical Impression  Pt admitted with above. Pt soft spoken and dysarthric; able to follow ~50% of 1 step commands with increased time; frequently stating she was cold. Verbalizing some basic needs I.e. wanting water and able to self feed with set up assist. Pt requiring maximal assist for bedside commode transfers with and without RW. Likely has difficulty locating objects with transitional movements due to visual deficit and being in an unfamiliar set up. Will benefit from acute PT to continue to mobilize while inpatient. Will likely benefit from d/c to familiar living environment with assist for ADL's and mobility.     Recommendations for follow up therapy are one component of a multi-disciplinary discharge planning process, led by the attending physician.  Recommendations may be updated based on patient status, additional functional criteria and insurance authorization.         Assistance Recommended at Discharge Frequent or constant Supervision/Assistance  Patient can return home with the following  A lot of help with walking and/or transfers;A lot of help with bathing/dressing/bathroom    Equipment Recommendations Wheelchair (measurements PT);Wheelchair cushion (measurements PT)  Recommendations for Other Services       Functional Status Assessment Patient has had a recent decline in their functional status and demonstrates the ability to make significant improvements in function in a reasonable and predictable amount of time.      Precautions / Restrictions Precautions Precautions: Fall;Other (comment) Precaution Comments: visual deficits Restrictions Weight Bearing Restrictions: No      Mobility  Bed Mobility Overal bed mobility: Needs Assistance Bed Mobility: Supine to Sit     Supine to sit: Max assist     General bed mobility comments: Decreased initiation, requiring assist at BLE's and trunk    Transfers Overall transfer level: Needs assistance Equipment used: Rolling walker (2 wheels), None Transfers: Bed to chair/wheelchair/BSC   Stand pivot transfers: Max assist         General transfer comment: MaxA to pivot to [facility] with no AD, and then to recliner with RW. Difficulty clearing feet, crouched posture, hand over hand guidance for locating handles on RW. Posterior lean    Ambulation/Gait                  Stairs            Wheelchair Mobility    Modified Rankin (Stroke Patients Only)       Balance Overall balance assessment: Needs assistance Sitting-balance support: Feet supported Sitting balance-Leahy Scale: Fair     Standing balance support: Bilateral upper extremity supported Standing balance-Leahy Scale: Poor                               Pertinent Vitals/Pain Pain Assessment Pain Assessment: Faces Faces Pain Scale: Hurts even more Pain Location: "stomach" Pain Descriptors / Indicators: Sharp, Discomfort Pain Intervention(s): Monitored during session, Limited activity within patient's tolerance    Home Living Family/patient expects to be discharged to:: Private residence Living Arrangements: Children (daughter) Available Help at Discharge: Available 24 hours/day Type of Home: House Home Access: Stairs  to enter Entrance Stairs-Rails: Right;Left;Can reach both Entrance Stairs-Number of Steps: 4   Home Layout: One level Home Equipment: Conservation officer, nature (2 wheels);BSC/3in1;Shower seat;Grab bars - toilet;Grab bars - tub/shower Additional  Comments: Has aide    Prior Function Prior Level of Function : Needs assist             Mobility Comments: uses walker, limited household ambulator ADLs Comments: Aide assist as needed     Hand Dominance   Dominant Hand: Right    Extremity/Trunk Assessment   Upper Extremity Assessment Upper Extremity Assessment: Generalized weakness    Lower Extremity Assessment Lower Extremity Assessment: Generalized weakness    Cervical / Trunk Assessment Cervical / Trunk Assessment: Kyphotic  Communication   Communication: Expressive difficulties (dysarthric, soft spoken)  Cognition Arousal/Alertness: Awake/alert Behavior During Therapy: Flat affect Overall Cognitive Status: Difficult to assess                                 General Comments: able to answer simple questions appropriately though some tangential responses (vs HOH). complains of being cold        General Comments      Exercises     Assessment/Plan    PT Assessment Patient needs continued PT services  PT Problem List Decreased strength;Decreased activity tolerance;Decreased balance;Decreased mobility;Decreased cognition;Decreased safety awareness;Pain       PT Treatment Interventions DME instruction;Gait training;Functional mobility training;Therapeutic activities;Therapeutic exercise;Balance training;Patient/family education    PT Goals (Current goals can be found in the Care Plan section)  Acute Rehab PT Goals Patient Stated Goal: unable to state PT Goal Formulation: Patient unable to participate in goal setting Time For Goal Achievement: 02/25/23 Potential to Achieve Goals: Fair    Frequency Min 3X/week     Co-evaluation               AM-PAC PT "6 Clicks" Mobility  Outcome Measure Help needed turning from your back to your side while in a flat bed without using bedrails?: A Lot Help needed moving from lying on your back to sitting on the side of a flat bed without using  bedrails?: A Lot Help needed moving to and from a bed to a chair (including a wheelchair)?: A Lot Help needed standing up from a chair using your arms (e.g., wheelchair or bedside chair)?: A Lot Help needed to walk in hospital room?: Total Help needed climbing 3-5 steps with a railing? : Total 6 Click Score: 10    End of Session Equipment Utilized During Treatment: Gait belt Activity Tolerance: Patient tolerated treatment well Patient left: in chair;with call bell/phone within reach;with chair alarm set Nurse Communication: Mobility status PT Visit Diagnosis: Unsteadiness on feet (R26.81);Muscle weakness (generalized) (M62.81);Difficulty in walking, not elsewhere classified (R26.2)    Time: [id number] PT Time Calculation (min) (ACUTE ONLY): 30 min   Charges:     PT Treatments $Therapeutic Activity: 23-37 mins        Wyona Almas, PT, DPT Acute Rehabilitation Services Office [phone]   Deno Etienne 02/11/2023, 10:40 AM

## 2023-02-11 NOTE — Care Management CC44 (Signed)
Condition Code 44 Documentation Completed  Patient Details  Name: MASIYA BABICZ MRN: [id number] Date of Birth: Mar 16, 1926   Condition Code 44 given:  Yes Patient signature on Condition Code 44 notice:  Yes Documentation of 2 MD's agreement:  Yes Code 44 added to claim:  Yes    Pollie Friar, RN 02/11/2023, 11:39 AM

## 2023-02-11 NOTE — Progress Notes (Addendum)
Chaplain responded to consult request for prayer. Pt was alert but not responsive. I provided prayer at her bedside and asked her nurse to communicate with her daughter that a chaplain visit has been completed. No further assessment made, and no follow up needed at this time.  [place], [place] Div    02/11/23 1145  Spiritual Encounters  Type of Visit Initial  Care provided to: Patient  Conversation partners present during encounter Nurse  Referral source Family  Reason for visit Routine spiritual support  Spiritual Framework  Presenting Themes Values and beliefs  Community/Connection Family  Patient Stress Factors Health changes  Interventions  Spiritual Care Interventions Made Compassionate presence;Prayer  Intervention Outcomes  Outcomes Connection to spiritual care

## 2023-02-11 NOTE — Progress Notes (Signed)
    Medical records reviewed including progress notes, labs, imaging.  Discussed with [facility] hospice liaison and Dr. Maryland Pink.  Goals of care are clear at this time for return home with hospice once medically optimized.  Hopefully this can be on either today or tomorrow.  PMT will follow peripherally at this time.  Please secure chat or call team line should urgent needs arise.  Thank you for your referral and allowing PMT to assist in Mrs. Cecila C Kagan's care.   Dorthy Cooler, [facility] Palliative Medicine Team  Team Phone # [phone]   NO CHARGE

## 2023-02-11 NOTE — Plan of Care (Signed)
  Problem: Health Behavior/Discharge Planning: Goal: Ability to manage health-related needs will improve Outcome: Progressing   

## 2023-02-12 LAB — HEMOGLOBIN A1C
Hgb A1c MFr Bld: 5 % (ref 4.8–5.6)
Mean Plasma Glucose: 97 mg/dL

## 2023-02-16 ENCOUNTER — Other Ambulatory Visit: Payer: Self-pay | Admitting: Family Medicine

## 2023-03-02 ENCOUNTER — Other Ambulatory Visit: Payer: Self-pay | Admitting: Family Medicine

## 2023-03-09 ENCOUNTER — Other Ambulatory Visit: Payer: Self-pay | Admitting: Cardiovascular Disease

## 2023-03-29 ENCOUNTER — Ambulatory Visit (INDEPENDENT_AMBULATORY_CARE_PROVIDER_SITE_OTHER)

## 2023-03-29 DIAGNOSIS — I442 Atrioventricular block, complete: Secondary | ICD-10-CM

## 2023-03-30 LAB — CUP PACEART REMOTE DEVICE CHECK
Battery Voltage: 65
Date Time Interrogation Session: 20240514074447
Implantable Lead Connection Status: 753985
Implantable Lead Connection Status: 753985
Implantable Lead Implant Date: 20190225
Implantable Lead Implant Date: 20190225
Implantable Lead Location: 753859
Implantable Lead Location: 753860
Implantable Lead Model: 377
Implantable Lead Model: 377
Implantable Lead Serial Number: 80514208
Implantable Lead Serial Number: 80598782
Implantable Pulse Generator Implant Date: 20190225
Pulse Gen Model: 407145
Pulse Gen Serial Number: 69245949

## 2023-04-22 ENCOUNTER — Other Ambulatory Visit: Payer: Self-pay | Admitting: Cardiovascular Disease

## 2023-04-26 NOTE — Progress Notes (Signed)
Remote pacemaker transmission.   

## 2023-05-19 ENCOUNTER — Other Ambulatory Visit: Payer: Self-pay | Admitting: Cardiovascular Disease

## 2023-05-20 ENCOUNTER — Other Ambulatory Visit: Payer: Self-pay | Admitting: Cardiovascular Disease

## 2023-06-21 DIAGNOSIS — H401133 Primary open-angle glaucoma, bilateral, severe stage: Secondary | ICD-10-CM | POA: Diagnosis not present

## 2023-06-23 ENCOUNTER — Other Ambulatory Visit: Payer: Self-pay | Admitting: Cardiovascular Disease

## 2023-06-23 ENCOUNTER — Other Ambulatory Visit: Payer: Self-pay | Admitting: Family Medicine

## 2023-06-28 ENCOUNTER — Ambulatory Visit (INDEPENDENT_AMBULATORY_CARE_PROVIDER_SITE_OTHER)

## 2023-06-28 DIAGNOSIS — I442 Atrioventricular block, complete: Secondary | ICD-10-CM

## 2023-07-08 DIAGNOSIS — H348121 Central retinal vein occlusion, left eye, with retinal neovascularization: Secondary | ICD-10-CM | POA: Diagnosis not present

## 2023-07-08 DIAGNOSIS — H4053X3 Glaucoma secondary to other eye disorders, bilateral, severe stage: Secondary | ICD-10-CM | POA: Diagnosis not present

## 2023-07-13 NOTE — Progress Notes (Signed)
Remote pacemaker transmission.   

## 2023-07-14 DIAGNOSIS — H3562 Retinal hemorrhage, left eye: Secondary | ICD-10-CM | POA: Diagnosis not present

## 2023-07-14 DIAGNOSIS — H401134 Primary open-angle glaucoma, bilateral, indeterminate stage: Secondary | ICD-10-CM | POA: Diagnosis not present

## 2023-07-14 DIAGNOSIS — H34812 Central retinal vein occlusion, left eye, with macular edema: Secondary | ICD-10-CM | POA: Diagnosis not present

## 2023-07-14 DIAGNOSIS — H35033 Hypertensive retinopathy, bilateral: Secondary | ICD-10-CM | POA: Diagnosis not present

## 2023-07-14 DIAGNOSIS — H3582 Retinal ischemia: Secondary | ICD-10-CM | POA: Diagnosis not present

## 2023-07-20 DIAGNOSIS — H4089 Other specified glaucoma: Secondary | ICD-10-CM | POA: Diagnosis not present

## 2023-07-20 DIAGNOSIS — H34812 Central retinal vein occlusion, left eye, with macular edema: Secondary | ICD-10-CM | POA: Diagnosis not present

## 2023-09-04 ENCOUNTER — Other Ambulatory Visit: Payer: Self-pay | Admitting: Cardiovascular Disease

## 2023-09-14 ENCOUNTER — Other Ambulatory Visit: Payer: Self-pay | Admitting: Cardiovascular Disease

## 2023-09-27 ENCOUNTER — Ambulatory Visit (INDEPENDENT_AMBULATORY_CARE_PROVIDER_SITE_OTHER)

## 2023-09-27 DIAGNOSIS — I442 Atrioventricular block, complete: Secondary | ICD-10-CM | POA: Diagnosis not present

## 2023-09-28 LAB — CUP PACEART REMOTE DEVICE CHECK
Date Time Interrogation Session: 20241112084418
Implantable Lead Connection Status: 753985
Implantable Lead Connection Status: 753985
Implantable Lead Implant Date: 20190225
Implantable Lead Implant Date: 20190225
Implantable Lead Location: 753859
Implantable Lead Location: 753860
Implantable Lead Model: 377
Implantable Lead Model: 377
Implantable Lead Serial Number: 80514208
Implantable Lead Serial Number: 80598782
Implantable Pulse Generator Implant Date: 20190225
Pulse Gen Model: 407145
Pulse Gen Serial Number: 69245949

## 2023-10-25 NOTE — Progress Notes (Signed)
Remote pacemaker transmission.   

## 2023-11-01 ENCOUNTER — Telehealth: Payer: Self-pay | Admitting: Family Medicine

## 2023-11-01 MED ORDER — SULFAMETHOXAZOLE-TRIMETHOPRIM 200-40 MG/5ML PO SUSP: 10.0000 mL | Freq: Two times a day (BID) | ORAL | 0 refills | Status: DC

## 2023-11-01 NOTE — Telephone Encounter (Signed)
Wants rx septra    she has another UtI  talking loopy again

## 2023-12-07 ENCOUNTER — Telehealth: Payer: Self-pay

## 2023-12-07 NOTE — Telephone Encounter (Signed)
Spoke to patients daughter who advises patient had syncopal event yesterday evening around 8pm. [employer] checked and last updated 12/06/23 @ 4:26 PM. Algis Downs we are not able to check monitor d/t only at night update so we will check tomorrow. States patients hospice nurse came to check on her today, called EMS and everthing checked out fine today. Advised if pt has syncope again she needs to go to ER for eval. Pts daughter voiced understanding. Will check monitor tomorrow 12/08/23.

## 2023-12-07 NOTE — Telephone Encounter (Signed)
The pt daughter Burna Mortimer states she had a syncope episode last night. She would like for the nurse to review the transmission to see if anything on it.

## 2023-12-07 NOTE — Telephone Encounter (Signed)
Baird Lyons told the pt daughter we will look at the transmission from tomorrow and give her a call tomorrow.

## 2023-12-08 NOTE — Telephone Encounter (Signed)
Remote transmission did not update last night 12/08/23. Attempted to contact patient to inquire. No answer, LMTCB.

## 2023-12-08 NOTE — Telephone Encounter (Signed)
I let the pt daughter know that her monitor did not update last night. She is going to check the monitor.

## 2023-12-12 NOTE — Telephone Encounter (Signed)
[employer] remote monitor checked and no alerts noted.

## 2023-12-27 ENCOUNTER — Ambulatory Visit (INDEPENDENT_AMBULATORY_CARE_PROVIDER_SITE_OTHER): Payer: Medicare Other

## 2023-12-27 DIAGNOSIS — I442 Atrioventricular block, complete: Secondary | ICD-10-CM

## 2023-12-29 LAB — CUP PACEART REMOTE DEVICE CHECK
Date Time Interrogation Session: 20250212095033
Implantable Lead Connection Status: 753985
Implantable Lead Connection Status: 753985
Implantable Lead Implant Date: 20190225
Implantable Lead Implant Date: 20190225
Implantable Lead Location: 753859
Implantable Lead Location: 753860
Implantable Lead Model: 377
Implantable Lead Model: 377
Implantable Lead Serial Number: 80514208
Implantable Lead Serial Number: 80598782
Implantable Pulse Generator Implant Date: 20190225
Pulse Gen Model: 407145
Pulse Gen Serial Number: 69245949

## 2024-01-07 ENCOUNTER — Encounter: Payer: Self-pay | Admitting: Cardiovascular Disease

## 2024-01-10 ENCOUNTER — Encounter: Payer: Self-pay | Admitting: Internal Medicine

## 2024-02-07 NOTE — Addendum Note (Signed)
 Addended by: Geralyn Flash D on: 02/07/2024 09:42 AM   Modules accepted: Orders

## 2024-02-07 NOTE — Progress Notes (Signed)
 Remote pacemaker transmission.

## 2024-02-14 DEATH — deceased

## 2024-03-27 ENCOUNTER — Ambulatory Visit: Payer: Medicare Other

## 2024-06-26 ENCOUNTER — Ambulatory Visit: Payer: Medicare Other

## 2024-09-25 ENCOUNTER — Ambulatory Visit: Payer: Medicare Other

## 2024-12-25 ENCOUNTER — Ambulatory Visit: Payer: Medicare Other
